# Patient Record
Sex: Female | Born: 1970 | Race: White | Hispanic: No | Marital: Married | Smoking: Never smoker
Health system: Southern US, Community
[De-identification: ages and names within clinical notes are randomized; demographics above are authoritative.]

## PROBLEM LIST (undated history)

## (undated) ENCOUNTER — Emergency Department (HOSPITAL_COMMUNITY): Admission: EM | Payer: 59 | Source: Home / Self Care

## (undated) DIAGNOSIS — E785 Hyperlipidemia, unspecified: Secondary | ICD-10-CM

## (undated) DIAGNOSIS — D649 Anemia, unspecified: Secondary | ICD-10-CM

## (undated) DIAGNOSIS — I471 Supraventricular tachycardia, unspecified: Secondary | ICD-10-CM

## (undated) DIAGNOSIS — R011 Cardiac murmur, unspecified: Secondary | ICD-10-CM

## (undated) DIAGNOSIS — E876 Hypokalemia: Secondary | ICD-10-CM

## (undated) DIAGNOSIS — F988 Other specified behavioral and emotional disorders with onset usually occurring in childhood and adolescence: Secondary | ICD-10-CM

## (undated) DIAGNOSIS — R6882 Decreased libido: Secondary | ICD-10-CM

## (undated) DIAGNOSIS — R7989 Other specified abnormal findings of blood chemistry: Secondary | ICD-10-CM

## (undated) DIAGNOSIS — N200 Calculus of kidney: Secondary | ICD-10-CM

## (undated) DIAGNOSIS — I499 Cardiac arrhythmia, unspecified: Secondary | ICD-10-CM

## (undated) DIAGNOSIS — F32A Depression, unspecified: Secondary | ICD-10-CM

## (undated) DIAGNOSIS — G43909 Migraine, unspecified, not intractable, without status migrainosus: Secondary | ICD-10-CM

## (undated) DIAGNOSIS — R87618 Other abnormal cytological findings on specimens from cervix uteri: Secondary | ICD-10-CM

## (undated) DIAGNOSIS — F419 Anxiety disorder, unspecified: Secondary | ICD-10-CM

## (undated) DIAGNOSIS — G479 Sleep disorder, unspecified: Secondary | ICD-10-CM

## (undated) DIAGNOSIS — F329 Major depressive disorder, single episode, unspecified: Secondary | ICD-10-CM

## (undated) DIAGNOSIS — R8789 Other abnormal findings in specimens from female genital organs: Secondary | ICD-10-CM

## (undated) HISTORY — DX: Anemia, unspecified: D64.9

## (undated) HISTORY — DX: Other abnormal cytological findings on specimens from cervix uteri: R87.618

## (undated) HISTORY — PX: ABLATION: SHX5711

## (undated) HISTORY — DX: Cardiac arrhythmia, unspecified: I49.9

## (undated) HISTORY — DX: Anxiety disorder, unspecified: F41.9

## (undated) HISTORY — DX: Hypokalemia: E87.6

## (undated) HISTORY — DX: Sleep disorder, unspecified: G47.9

## (undated) HISTORY — DX: Other specified behavioral and emotional disorders with onset usually occurring in childhood and adolescence: F98.8

## (undated) HISTORY — DX: Calculus of kidney: N20.0

## (undated) HISTORY — DX: Cardiac murmur, unspecified: R01.1

## (undated) HISTORY — DX: Other specified abnormal findings of blood chemistry: R79.89

## (undated) HISTORY — PX: CARDIOVERSION: SHX1299

## (undated) HISTORY — DX: Depression, unspecified: F32.A

## (undated) HISTORY — PX: COLPOSCOPY: SHX161

## (undated) HISTORY — DX: Major depressive disorder, single episode, unspecified: F32.9

## (undated) HISTORY — DX: Decreased libido: R68.82

## (undated) HISTORY — DX: Migraine, unspecified, not intractable, without status migrainosus: G43.909

## (undated) HISTORY — PX: BREAST LUMPECTOMY: SHX2

---
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
## 1997-11-30 ENCOUNTER — Inpatient Hospital Stay (HOSPITAL_COMMUNITY): Admission: AD | Admit: 1997-11-30 | Discharge: 1997-11-30 | Payer: Self-pay | Admitting: *Deleted

## 1997-12-09 ENCOUNTER — Inpatient Hospital Stay (HOSPITAL_COMMUNITY): Admission: AD | Admit: 1997-12-09 | Discharge: 1997-12-11 | Payer: Self-pay | Admitting: *Deleted

## 1998-01-11 ENCOUNTER — Other Ambulatory Visit: Admission: RE | Admit: 1998-01-11 | Discharge: 1998-01-11 | Payer: Self-pay | Admitting: Obstetrics and Gynecology

## 1998-02-01 ENCOUNTER — Emergency Department (HOSPITAL_COMMUNITY): Admission: EM | Admit: 1998-02-01 | Discharge: 1998-02-01 | Payer: Self-pay | Admitting: Emergency Medicine

## 1998-02-02 ENCOUNTER — Ambulatory Visit (HOSPITAL_COMMUNITY): Admission: RE | Admit: 1998-02-02 | Discharge: 1998-02-02 | Payer: Self-pay | Admitting: Urology

## 1998-02-25 ENCOUNTER — Ambulatory Visit (HOSPITAL_COMMUNITY): Admission: RE | Admit: 1998-02-25 | Discharge: 1998-02-25 | Payer: Self-pay | Admitting: Urology

## 1999-01-27 ENCOUNTER — Other Ambulatory Visit: Admission: RE | Admit: 1999-01-27 | Discharge: 1999-01-27 | Payer: Self-pay | Admitting: Obstetrics and Gynecology

## 1999-12-20 ENCOUNTER — Emergency Department (HOSPITAL_COMMUNITY): Admission: EM | Admit: 1999-12-20 | Discharge: 1999-12-20 | Payer: Self-pay | Admitting: Emergency Medicine

## 1999-12-20 ENCOUNTER — Encounter: Payer: Self-pay | Admitting: Emergency Medicine

## 2000-01-03 ENCOUNTER — Emergency Department (HOSPITAL_COMMUNITY): Admission: EM | Admit: 2000-01-03 | Discharge: 2000-01-03 | Payer: Self-pay | Admitting: Emergency Medicine

## 2000-01-05 ENCOUNTER — Encounter: Payer: Self-pay | Admitting: *Deleted

## 2000-01-05 ENCOUNTER — Encounter: Admission: RE | Admit: 2000-01-05 | Discharge: 2000-01-05 | Payer: Self-pay | Admitting: *Deleted

## 2000-01-20 ENCOUNTER — Ambulatory Visit (HOSPITAL_COMMUNITY): Admission: RE | Admit: 2000-01-20 | Discharge: 2000-01-20 | Payer: Self-pay | Admitting: Surgery

## 2000-01-20 ENCOUNTER — Encounter (INDEPENDENT_AMBULATORY_CARE_PROVIDER_SITE_OTHER): Payer: Self-pay | Admitting: Specialist

## 2000-02-02 ENCOUNTER — Other Ambulatory Visit: Admission: RE | Admit: 2000-02-02 | Discharge: 2000-02-02 | Payer: Self-pay | Admitting: Obstetrics and Gynecology

## 2000-03-01 ENCOUNTER — Other Ambulatory Visit: Admission: RE | Admit: 2000-03-01 | Discharge: 2000-03-01 | Payer: Self-pay | Admitting: Obstetrics and Gynecology

## 2000-03-01 ENCOUNTER — Encounter (INDEPENDENT_AMBULATORY_CARE_PROVIDER_SITE_OTHER): Payer: Self-pay

## 2000-08-27 ENCOUNTER — Other Ambulatory Visit: Admission: RE | Admit: 2000-08-27 | Discharge: 2000-08-27 | Payer: Self-pay | Admitting: Obstetrics and Gynecology

## 2000-08-28 HISTORY — PX: BREAST EXCISIONAL BIOPSY: SUR124

## 2000-12-21 ENCOUNTER — Other Ambulatory Visit: Admission: RE | Admit: 2000-12-21 | Discharge: 2000-12-21 | Payer: Self-pay | Admitting: Obstetrics and Gynecology

## 2001-02-06 ENCOUNTER — Emergency Department (HOSPITAL_COMMUNITY): Admission: EM | Admit: 2001-02-06 | Discharge: 2001-02-06 | Payer: Self-pay

## 2001-04-05 ENCOUNTER — Inpatient Hospital Stay (HOSPITAL_COMMUNITY): Admission: RE | Admit: 2001-04-05 | Discharge: 2001-04-07 | Payer: Self-pay | Admitting: Internal Medicine

## 2002-12-29 ENCOUNTER — Emergency Department (HOSPITAL_COMMUNITY): Admission: EM | Admit: 2002-12-29 | Discharge: 2002-12-29 | Payer: Self-pay | Admitting: Emergency Medicine

## 2004-02-11 ENCOUNTER — Other Ambulatory Visit: Admission: RE | Admit: 2004-02-11 | Discharge: 2004-02-11 | Payer: Self-pay | Admitting: Internal Medicine

## 2006-06-16 ENCOUNTER — Observation Stay (HOSPITAL_COMMUNITY): Admission: EM | Admit: 2006-06-16 | Discharge: 2006-06-17 | Payer: Self-pay | Admitting: Emergency Medicine

## 2007-12-14 ENCOUNTER — Inpatient Hospital Stay (HOSPITAL_COMMUNITY): Admission: EM | Admit: 2007-12-14 | Discharge: 2007-12-17 | Payer: Self-pay | Admitting: Emergency Medicine

## 2007-12-16 ENCOUNTER — Ambulatory Visit: Payer: Self-pay | Admitting: Psychiatry

## 2008-05-06 ENCOUNTER — Emergency Department (HOSPITAL_COMMUNITY): Admission: EM | Admit: 2008-05-06 | Discharge: 2008-05-07 | Payer: Self-pay | Admitting: Emergency Medicine

## 2009-05-20 ENCOUNTER — Emergency Department (HOSPITAL_COMMUNITY): Admission: EM | Admit: 2009-05-20 | Discharge: 2009-05-20 | Payer: Self-pay | Admitting: Emergency Medicine

## 2009-10-28 ENCOUNTER — Encounter: Admission: RE | Admit: 2009-10-28 | Discharge: 2009-10-28 | Payer: Self-pay | Admitting: Obstetrics and Gynecology

## 2010-09-18 ENCOUNTER — Encounter: Payer: Self-pay | Admitting: Gastroenterology

## 2010-12-02 LAB — URINALYSIS, ROUTINE W REFLEX MICROSCOPIC
Bilirubin Urine: NEGATIVE
Glucose, UA: NEGATIVE mg/dL
Hgb urine dipstick: NEGATIVE
Ketones, ur: NEGATIVE mg/dL
Nitrite: NEGATIVE
Protein, ur: NEGATIVE mg/dL
Specific Gravity, Urine: 1.026 (ref 1.005–1.030)
Urobilinogen, UA: 0.2 mg/dL (ref 0.0–1.0)
pH: 6 (ref 5.0–8.0)

## 2010-12-02 LAB — POCT I-STAT, CHEM 8
BUN: 10 mg/dL (ref 6–23)
Calcium, Ion: 1.11 mmol/L — ABNORMAL LOW (ref 1.12–1.32)
Chloride: 106 mEq/L (ref 96–112)
Creatinine, Ser: 1 mg/dL (ref 0.4–1.2)
Glucose, Bld: 100 mg/dL — ABNORMAL HIGH (ref 70–99)
HCT: 34 % — ABNORMAL LOW (ref 36.0–46.0)
Hemoglobin: 11.6 g/dL — ABNORMAL LOW (ref 12.0–15.0)
Potassium: 2.6 mEq/L — CL (ref 3.5–5.1)
Sodium: 140 mEq/L (ref 135–145)
TCO2: 21 mmol/L (ref 0–100)

## 2010-12-02 LAB — POCT PREGNANCY, URINE: Preg Test, Ur: NEGATIVE

## 2011-01-10 NOTE — H&P (Signed)
NAMEJANAYLA, MARIK                 ACCOUNT NO.:  [id number]   MEDICAL RECORD NO.:  [id number]          PATIENT TYPE:  EMS   LOCATION:  ED                           FACILITY:  [facility]   PHYSICIAN:  Lonia Blood, M.D.DATE OF BIRTH:  12/02/1970   DATE OF ADMISSION:  12/14/2007  DATE OF DISCHARGE:                              HISTORY & PHYSICAL   PRIMARY CARE PHYSICIAN:  Dr. Marisue Brooklyn   CHIEF COMPLAINT:  Dizziness.   HISTORY OF PRESENT ILLNESS:  Ms. Daziya Redmond is a 40 year old female who  states that she has had an approximate 3-5 day history of worsening  dizziness.  This has also been associated with generalized sensation of  weakness and a sensation of tingling in her lower extremities  bilaterally.  The patient does admit to having possibly 3-4 days of  diarrhea as well.  She states this has been accompanied by some nausea  but no real vomiting.  She admits to using Dulcolax 2-3 tablets on an  every day or every other day basis but states that she has been doing  this for years, and that her recent diarrhea was a new development.  She  denies any other sick contacts or intake of new foods of distant travel.  It is interesting that she had a similar admission in October 2007, with  severe otherwise unexplained dehydration.  At the present time, she  denies chest pain, fevers, chills, shortness of breath.  She does have a  mild generalized headache and also significant dizziness with any  postural change whatsoever.  Despite this, she is actually thirsty and  does wish to eat.   REVIEW OF SYSTEMS:  Comprehensive review of systems is positive for a  significant flat affect.  The patient admits to feeling depressed due to  losing her job approximately 1 month ago.  Otherwise, comprehensive  review of systems unremarkable with the exception of multiple positive  elements noted in history of present illness above.   PAST MEDICAL HISTORY:  1. Depression.  2. Anxiety  disorder.  3. Migraine headaches.  4. Arrhythmia status post radiofrequency ablation by Dr. Lewayne Bunting      in 2002.  5. History of laxative/abuse.   OUTPATIENT MEDICATIONS:  1. Prozac 60 mg daily.  2. Imitrex p.r.n.  3. Xanax 0.25 p.o. q.h.s.   ALLERGIES:  ZITHROMAX.   FAMILY HISTORY:  No significant family history of diabetes,  hypertension, or coronary artery disease.   SOCIAL HISTORY:  The patient does not smoke.  She does not use illicit  drugs.  She lives in [place].  She has 2 children.  She socially  drinks alcohol, but this is less than 1-2 drinks per week on average.   DATA REVIEW:  White count is elevated at 13.1.  Hemoglobin and platelet  count are normal.  MCV is normal.  Sodium, potassium chloride bicarb are  normal.  BUN is markedly elevated at 59.  Creatinine is markedly  elevated at 6.23.  Serum glucose and calcium are normal.  LFTs are  normal.  Albumin is 3.9.  PHYSICAL EXAM:  Temperature has not been recorded in the ER.  Blood  pressure is 83/56, heart rate is 87-101, respiratory rate 22, O2  saturations 95% on room air.  GENERAL:  Thin, frail-appearing female in no acute respiratory distress.  HEENT:  Normocephalic, atraumatic.  Pupils equal, round, react to light  and accommodation.  Extraocular muscles intact bilaterally, OP clear.  NECK:  No JVD.  Lungs:  Clear to auscultation bilaterally without wheeze or rhonchi.  CARDIOVASCULAR:  Regular rate and rhythm but tachycardic without gallop  or rub.  ABDOMEN:  Nontender, nondistended, soft bowel sounds present, no  hepatosplenomegaly, no rebound, no ascites.  EXTREMITIES:  As noted, no cyanosis, clubbing, edema bilateral  extremities.  NEUROLOGIC:  5/5 strength bilateral upper and lower extremities, intact  sensation to touch throughout.  No Babinski, alert and oriented x4.   IMPRESSION AND PLAN:  1. Severe acute renal failure - this appears to be prerenal in this 38-      year-old otherwise  healthy female who is clearly severely volume      depleted.  We will aggressively hydrate her.  I will obtain a renal      ultrasound to assure that there is no evidence of a lower urinary      tract obstruction or a bilateral hydronephrosis.  This, however, is      not likely, but I will proceed with the exam for completeness' sake      due to the severity of her renal failure.  We will watch her      electrolytes closely.  2. Severe dehydration and hypotension - this appears to be a      hypovolemic hypotension.  We will aggressively hydrate this 37-year-      old with no history of coronary disease or congestive heart      failure.  We will follow her blood pressure and heart rate closely.  3. Depression - we will continue the patient's Prozac.  4. 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  We will try Senokot during      her hospital stay and see if she is able to move her bowels without      constant Dulcolax therapy.      Lonia Blood, M.D.  Electronically Signed     JTM/MEDQ  D:  12/14/2007  T:  12/14/2007  Job:  [id number]   cc:   Lovenia Kim, D.O.  Fax: [phone]

## 2011-01-13 NOTE — Op Note (Signed)
[facility]  Patient:    [name], [name]                        MRN: [id number] Proc. Date: 01/20/00 Adm. Date:  [id number] Disc. Date: [id number] Attending:  Abigail Miyamoto A                           Operative Report  PREOPERATIVE DIAGNOSIS:  Right breast mass.  POSTOPERATIVE DIAGNOSIS:  Right breast mass.  OPERATION PERFORMED:  Excision of right breast mass.  SURGEON:  Abigail Miyamoto, M.D.  ANESTHESIA:  1% lidocaine and MAC.  ESTIMATED BLOOD LOSS:  Minimal.  DESCRIPTION OF PROCEDURE:  The patient was brought to the operating room and identified as Emily Park.  She was placed supine on the operating table and anesthesia was induced.  Her right breast was then prepped and draped in the usual sterile fashion.  The skin overlying the palpable breast mass at the 12 oclock position was then anesthetized with 1% lidocaine.  Incision was carried down to the breast tissue with a 15 blade scalpel.  Next, a generous lumpectomy was performed removing all the surrounding tissue around the palpable breast mass going down to the chest wall.  The entire specimen which was a golf ball size specimen was then completely excised and sent to pathology for identification.  The wound cavity was then irrigated with normal saline.  Hemostasis was achieved with electrocautery.  The subcutaneous layer was then closed with interrupted 3-0 Vicryl sutures and the skin was closed with running 4-0 Monocryl.  Steri-Strips, gauze and Tegaderm were then applied.  The patient tolerated the procedure well.  Sponge, needle and instrument counts were correct at the end of the procedure.  The patient was then extubated in the operating room and taken in stable condition to the recovery room. DD:  01/20/00 TD:  01/24/00 Job: [id number]

## 2011-01-13 NOTE — H&P (Signed)
[name], Park                 ACCOUNT NO.:  [id number]   MEDICAL RECORD NO.:  [id number]          PATIENT TYPE:  EMS   LOCATION:  ED                           FACILITY:  [facility]   PHYSICIAN:  Lonia Blood, M.D.      DATE OF BIRTH:  Apr 10, 1971   DATE OF ADMISSION:  06/16/2006  DATE OF DISCHARGE:                                HISTORY & PHYSICAL   PRIMARY CARE PHYSICIAN:  Dr. Marisue Brooklyn.   PRESENTING COMPLAINT:  Weakness and dizziness.   HISTORY OF PRESENT ILLNESS:  The patient is a 40 year old with a history of  depression, anxiety, who had recently felt weak and dry.  She has been  taking laxatives apparently on a regular basis.  Denied any unusual  diarrhea.  She is nauseated but no vomiting.  The patient was seen in the  emergency room with hypotension; systolic blood pressure 89.  She is also  profoundly hypokalemic, hence she has been admitted for further evaluation.   PAST MEDICAL HISTORY:  1. Depression.  2. Anxiety.  3. A history of arrhythmia status post ablation.  4. Frequent migraine headaches.   ALLERGIES:  THE PATIENT IS ALLERGIC TO ZITHROMAX.   MEDICATIONS:  1. Prozac 60 mg daily.  2. Imitrex as needed.  3. Xanax 0.25 mg at night.  4. Topamax 50-100 mg daily.   SOCIAL HISTORY:  The patient is married with two children ages 39 and 26 and  lives here in [place].  No tobacco use.  Social alcohol drinker.  She is  gainfully employed.   FAMILY HISTORY:  No family history of diabetes, hypertension, or any  psychiatric illness.   REVIEW OF SYSTEMS:  A 12-point review of systems is negative except for some  weight loss associated with her nausea, vomiting.   PHYSICAL EXAMINATION:  VITAL SIGNS:  Temperature 97.6, blood pressure  initially 89/66, pulse 104, respiratory rate 18, saturations 100% on room  air.  GENERAL:  Generally, the patient looks weak and acutely ill-looking but in  no acute distress.  HEENT:  PERRL.  EOMI.  Her mucous membranes are  dry.  NECK:  Her neck is supple.  No JVD.  No lymphadenopathy.  RESPIRATORY:  She has good air entry bilaterally.  No wheezes or rales.  CARDIOVASCULAR SYSTEM:  She has S1, S2.  No murmurs.  ABDOMEN:  Soft and nontender with positive bowel sounds.  EXTREMITIES:  Extremities show no edema, cyanosis, or clubbing.   LABS:  White count of 9.9, hemoglobin 14.3, platelet count 370 with mild  monocytosis.  Sodium is 137, potassium 2.4, chloride 99, CO2 25, glucose 94,  BUN 17, creatinine 2.3, calcium 9.7.  Her urinalysis showed cloudy urine  with positive leukocyte esterase, WBCs 3-6, and many bacteria, and some  hyaline casts, and many epithelials.   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  Also, most  likely urinary tract infection.   PLAN:  1. Profound hypokalemia.  This is most likely from fluid loss.  I suspect  diarrhea from the laxative abuse.  We will replete patient's potassium.      We will also check magnesium level to be sure that her magnesium is      okay.  We will get her put on overnight observation until her potassium      is fully corrected.  2. Profound hypotension; this is also secondary to fluid loss.  She will      receive at least 2 liters' bolus then we will continue on at least 150      mL/hour and monitor her blood pressure as well as her renal function.  3. Possible urinary tract infection.  We will start her on empiric Cipro      and go with a urine culture and sensitivity.  This may be responsible      for her nausea as well.  4. Acute renal failure.  Again, this is most likely prerenal.  We will      hydrate the patient effectively and follow her renal function in the      hospital.  5. Depression, anxiety, with insomnia.  I will put the patient on her home      medications since she is able to take POs at this point.  6. Migraine headaches.  I will  continue with her Topamax prophylaxis for      now.  Otherwise, the patient will be here for observation and depending      on how she does overnight, we will plan further treatment measures.      Lonia Blood, M.D.  Electronically Signed     LG/MEDQ  D:  06/16/2006  T:  06/17/2006  Job:  [id number]

## 2011-01-13 NOTE — Discharge Summary (Signed)
[place]. [facility]  Patient:    Emily Park, Emily Park                        MRN: [id number] Adm. Date:  [id number] Disc. Date: 04/05/01 Attending:  Lewayne Bunting Dictator:   Chinita Pester, C.R.N.P. CC:         Darden Palmer., M.D.   Discharge Summary  PRIMARY DIAGNOSES:  Supraventricular tachycardia.  HISTORY OF PRESENT ILLNESS:  This is a 40 year old female who is being admitted for an SVT ablation.  She is a patient of Dr. Tawana Scale.  Her history dates back to approximately two years ago.  She states at that time she began experiencing intermittent episodes of palpitations that gradually worsened in severity and became longer in duration.  Subsequently found to have supraventricular tachycardia.  She was seen in the emergency room on several occasions and treated with Adenosine with conversion to sinus rhythm.  She has been treated with multiple medications including Lopressor, Inderal, verapamil, and Cardizem.  Unfortunately, she continues to have supraventricular tachycardia.  Denies any chest pain, but does experience shortness of breath and near syncope.  When she develops her episodes of SVT these will often terminate spontaneously, but sometimes require intravenous medication.  Subsequent evaluation demonstrated normal left ventricular systolic function, no structural or valvular abnormalities, and mild mitral and tricuspid regurgitation.  HOSPITAL COURSE:  Patient was admitted and underwent an EP study and radio frequency ablation of slow AV nodal pathway performed without immediate complications.  On the post procedural evening patient had recurrent SVT at 150-160 beats per minute initiated by two PVCs while sleeping.  Patient was to be evaluated in another six weeks.  She was discharged to home on p.r.n. verapamil 90 mg q.8h.  She was to follow with Dr. Sharrell Ku in six weeks. Patient was also instructed to take a coated aspirin q.d. for six  weeks and antibiotics p.r.n. prior to any dental work, GYN procedures, or bowel procedures within the next three months.  She was instructed not to do any heavy lifting or strenuous activity for four days and no driving for two days.  Low fat, low cholesterol diet.  She was also instructed to call if she developed a lump in her groin or any drainage from her wounds.  She was to call Dr. Bruna Potter office to schedule the appointment for six weeks. DD:  04/05/01 TD:  04/06/01 Job: [id number]

## 2011-01-13 NOTE — Discharge Summary (Signed)
NAMEBRIAUNA, Emily Park                 ACCOUNT NO.:  [id number]   MEDICAL RECORD NO.:  [id number]          PATIENT TYPE:  INP   LOCATION:  1612                         FACILITY:  [facility]   PHYSICIAN:  Hettie Holstein, D.O.    DATE OF BIRTH:  1971/05/17   DATE OF ADMISSION:  06/16/2006  DATE OF DISCHARGE:  06/17/2006                                 DISCHARGE SUMMARY   Patient's primary care physician is Dr. Marisue Brooklyn, whom I am asking Ms.  Jarold Park to call and schedule an appointment with for hospital followup this  week.  I am asking that she call tomorrow, as today is Sunday.   REASON FOR ADMISSION:  Weakness, dizziness.   PRINCIPAL DIAGNOSES:  1. Dehydration, hypokalemia.  2. Hypotension, felt to be due to dehydration, volume depletion.  3. Anemia, normocytic.  She has recently donated blood in the past 48      hours.  Iron studies have been sent out.  She will be sent home also on      iron supplementation.  4. History of depression and anxiety.  5. History of arrhythmias, status post ablation in the past.  6. History of migraine headaches.   DISCHARGE MEDICATIONS:  1. I am asking her to continue her Prozac as before, 60 mg daily.  2. Imitrex is on a p.r.n. basis.  3. Xanax 0.25 mg nightly p.r.n.  4. Topamax 50 mg tablets 1-2 q.24h. for migraine prophylaxis.  5. Additional iron sulfate 325 mg p.o. b.i.d. x2 weeks.  6. Ambien 5 mg p.o. nightly p.r.n. insomnia.   DISPOSITION AT PRESENT:  I am asking that Emily Park follow up with her  primary care physician this week to coordinate perhaps some psychiatric  followup, as she has appeared quite depressed here.  She has denied any  suicidal or homicidal ideation, although her appetite has been poor.  She  reports an exhibited weight loss.   In addition, she has neglected to continue her follow-up screening exams,  including Pap smears and routine pelvic examinations as well as mammograms.  I am asking that she reinitiate these  screening processes.  We are  administering a flu vaccine prior to discharge.   HISTORY OF PRESENT ILLNESS:  For full details, please refer to H&P as  dictated by Dr. Lonia Blood; however, briefly, Emily Park is a 40 year old  female with a history of depression and anxiety who has been feeling quite  weak and was noted to be hypotensive.  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   HOSPITAL COURSE:  Emily Park was admitted and provided with generous IV  fluids.  Her orthostatic symptoms resolved.  Her blood pressure did  normalize.  She did appear to have resolve of her symptoms.  She was  repleted with oral potassium supplements as well as supplemental potassium  in her IV fluids.  She was encouraged to take in fluids, including sports  drinks such as Gatorade or Power Aid for the next 24-48 hours until she sees  her  primary care physician.  In addition, she is being provided with outpatient  information for supportive options, including counseling and psychiatry.  She does have [facility], and she is encouraged to resource the list  of providers in that directory so that she can obtain referral.      Hettie Holstein, D.O.  Electronically Signed     ESS/MEDQ  D:  06/17/2006  T:  06/17/2006  Job:  [id number]   cc:   Lovenia Kim, D.O.  Fax: [phone]

## 2011-01-13 NOTE — Discharge Summary (Signed)
[place]. [facility]  Patient:    Emily Park, Emily Park                        MRN: [id number] Adm. Date:  [id number] Disc. Date: 04/07/01 Attending:  Lewayne Bunting Dictator:   Tereso Newcomer, P.A.-C. CC:         Darden Palmer., M.D.   Discharge Summary  ADDENDUM:  The patient developed recurrent SVT, suggestive of left atrial tachycardia. Dr. Ladona Ridgel saw the patient on April 05, 2001, and decided to start her on flecainide therapy.  She will need to be monitored in the hospital over the weekend.  On August 10, she was in stable condition without any further palpitations or nausea.  Telemetry revealed normal sinus rhythm, no arrhythmias.  Her ECG revealed a Q-Tc of 431 msec.  She was continued on flecainide and continued to be monitored.  On April 07, 2001, she was found to be in stable condition without any further arrhythmias.  Therefore, it was felt she was stable enough to discharge to home.  DISCHARGE MEDICATIONS: 1. Flecainide 50 mg q.12h. 2. Toprol XL 25 mg q.d. 3. Coated aspirin 325 mg for 6 weeks.  ACTIVITY:  Nothing strenuous for 2-3 days.  DIET:  Low fat, low sodium.  SPECIAL INSTRUCTIONS:  The patient is to call our office for any swelling, bleeding, drainage, or bruising.  Special note:  She will need antibiotics prior to any dental, GYN or bowel procedures in the next three months.  FOLLOWUP:  She will need followup with exercise treadmill test to rule out exercise-induced arrhythmias while on flecainide Friday, August 16 and she will need a followup appointment with Dr. Lewayne Bunting in 4-6 weeks.  The office should call her with an appointment.  FINAL DIAGNOSES: 1. 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 2. Status post radiofrequency ablation for slow arteriovenous nodal    pathway. 3. Recurrent supraventricular tachycardia, suggestive of left atrial    tachycardia. 4. Flecainide therapy. DD:  04/07/01 TD:   04/07/01 Job: [id number]

## 2011-01-13 NOTE — Procedures (Signed)
[place]. [facility]  Patient:    Emily Park, Emily Park                        MRN: [id number] Proc. Date: 04/04/01 Adm. Date:  [id number] Attending:  Lewayne Bunting CC:         Darden Palmer., M.D.             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  INTRODUCTION:  The patient is a very pleasant 40 year old woman with a two-year history of tachypalpitations, which start and stop suddenly and are terminated with AV nodal blockingdrugs.  The patient states that during tachycardia she feels pulsations in her neck.  She is now referred for electrophysiologic study and RF catheter ablation, as she has had recurrent tachypalpitations despite medical therapy.  DESCRIPTION OF PROCEDURE:  After informed consent was obtained, the patient was taken to the diagnostic EP lab in a fasted state.  After the usual preparation and draping, intravenous fentanyl and midazolam were given for sedation.  A 6 French hexapolar catheter was inserted percutaneously in the right jugular vein and advanced to the coronary sinus.  A 5 French quadripolar catheter was inserted percutaneously in the right femoral vein and advanced to the RV apex.  A 5 French quadripolar catheter was inserted percutaneously in the right femoral vein and advanced to the His bundle region.  After measurement of the basic intervals, rapid ventricular pacing was carried out from the RV apex, demonstrating a VAWenckebach cycle length of 550 milliseconds.  During rapid ventricular pacing, the atrial activation was midline and decremental.  Next, programmed ventricular stimulation was carried out to the RV apex at a basic drive cycle length of 962 milliseconds and stepwise decreased down to 540 milliseconds, where the retrograde AV node ERP was observed.  Next, rapid atrial pacing was carried out  from the coronary sinus at 490 milliseconds and stepwise decreased down to 290 milliseconds, where AV Wenckebach was observed.  During rapid atrial pacing, the PR interval became greater thanthe RR interval, but there was no inducible sustained SVT. Programmed atrial stimulation was then carried out from the coronary sinus at a basic drive cycle length of 952 milliseconds and stepwise decreased down t 210 milliseconds, where the atrial ERP was observed.  At this point isoproterenol was infused at rates varying from 1-3 mcg/min.  Additional rapid atrial pacing was carried out, and the AV Wenckebach cycle length on isoproterenol was 240 milliseconds.  Again the PR interval became greater than the RR interval, but there was no sustained inducible SVT.  There were double echo beats present.  Interestingly enough, the earliest activation appeared to be in the left atrium approximately 3 or 4 cm from the coronary sinus ostium. 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  To determine whether this was, in fact, the case, the right femoral artery was punctured and a quadripolar catheter was advanced retrograde across the aortic valve and placed on the ventricular insertion of the mitral annulus at the siteof the proposed accessory pathway. Ventricular pacing was carried out from this location, and there was no evidence of accessory pathway retrograde conduction.  At this point, IV Lopressor was given in an attempt to initiate tachycardia, and this also was unsuccessful with no SVT induced during rapid atrial pacing.  Finally the ablation catheter was maneuvered into the slow pathway region.  Based on the patients previous history of arrhythmias and based on her findings at electrophysiologic testing and despite the induction of sustained SVT, six RF energy applications were subsequently delivered to the slow pathway  region, which resulted in accelerated junctional rhythm.  Following this, additional rapid atrial pacing was carried out demonstrating no evidence of SVT, and her AV Wenckebach cycle length went from 290 milliseconds up to 350 milliseconds. At this point the catheters were removed, hemostasis assured, and the patient returned to her room in satisfactory condition.  COMPLICATIONS:  There were no immediate procedural complications.  RESULTS: A. BASELINE ELECTROCARDIOGRAM:  The baseline ECG demonstrates normal sinus    rhythm with normal axis and intervals and no evidence of ventricular    pre-excitation. B. BASELINE INTERVALS:  The sinus node cycle length was 833 milliseconds, the    QRS duration 90 milliseconds, the PR interval 147 milliseconds,and the    HV interval 53 milliseconds.  C. RAPID ATRIAL PACING:Rapid atrial pacing was carried out from the coronary    sinus as well asthe high right atrium, demonstrating an AV Wenckebach    cycle length of 290 milliseconds.  During isoproterenol infusion, the AV    Wenckebach cycle length was 240 milliseconds, and following catheter    ablation the AVWenckebach cycle length was 340 milliseconds.  D. PROGRAMMED ATRIAL STIMULATION:  Programmed atrial stimulation was carried    out from the coronary sinus as well as the high right atrium at basic    drive cycle length of 161 milliseconds and stepwise decreased down to 210    milliseconds, where atrial ERP was observed.  During programmed atrial    stimulation, there were multiple AH jumps.  E. RAPID VENTRICULAR PACING:  Rapid ventricular pacing demonstrated a VA    Wenckebach cycle length of 550 milliseconds.  During rapid ventricular    pacing the atrial activation sequence was midline and decremental.  F. PROGRAMMED VENTRICULAR STIMULATION:  Programmed ventricular stimulation    carried out from the right ventricle demonstrated a retrograde AV node ERP    of 600/540.  During rapid  ventricular pacing, the atrial activation    sequence remained midline and decremental.   G. ARRHYTHMIAS OBSERVED:  There were no sustained arrhythmias observed.  H. MAPPING:  Mapping was carried out, demonstrating the typical Kochs    triangle.  I. RADIOFREQUENCY ENERGY APPLICATION:  A total of six RF energy applications    were delivered to the slow pathway region, which resulted in accelerated    junctional rhythm.  Following this, the AV Wenckebach cycle length    increased from 290 to 340 milliseconds.  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  Because of the patients clinical history of arrhythmia and documented SVT as well as findings at electrophysiologic testing, RF energy application was subsequently carried out to the slow pathway region.  There were no immediate procedural complications. DD:  04/04/01 TD:  04/05/01 Job: [id number]

## 2011-01-13 NOTE — Discharge Summary (Signed)
Emily Park, Emily Park                 ACCOUNT NO.:  [id number]   MEDICAL RECORD NO.:  [id number]          PATIENT TYPE:  INP   LOCATION:  1234                         FACILITY:  [facility]   PHYSICIAN:  Hettie Holstein, D.O.    DATE OF BIRTH:  05/05/1971   DATE OF ADMISSION:  12/14/2007  DATE OF DISCHARGE:  12/17/2007                               DISCHARGE SUMMARY   However, [name] left against medical advice this is just a  summation of that course prior to her signing out against advice.   DATE OF ADMISSION:  December 14, 2007.   PRIMARY CARE Abri Vacca:  Dr. Marisue Brooklyn.   DIAGNOSES:  Suspected adrenal insufficiency with a.m. cortisol measure  of 2.8 with a presentation of a hypotension and there were further  recommendations for evaluation including MRI of the brain.  This was  performed and essentially was no significant findings.  There were some  recommendations for psychiatric evaluation as the patient had reported  some bulimia and anorexia but the patient again declined these.   HISTORY OF PRESENT ILLNESS:  Please refer to the HPI as dictated by Dr.  Sharon Seller. However, briefly [name] is a 40 year old female who stated  that she had been having 3 to 5 day history of worsening dizziness. This  was associated with generalized sensation of weakness and tingling in  her lower extremities bilaterally. She is having some diarrhea as well.  She admitted to chronic and recurrent uses of daily laxatives.  She had  also similar admission in October 2007. In review of the record where  she had had unexplained dehydration quite severe.  In any event hospital  course was unremarkable.  In any event, she was able that touch base  with Dr. Electa Sniff prior to departing and she had agreed to pursue further  outpatient evaluation and workup for adrenal insufficiency on an  outpatient basis. Medications were not prescribed.      Hettie Holstein, D.O.  Electronically Signed     ESS/MEDQ   D:  02/12/2008  T:  02/12/2008  Job:  [id number]

## 2011-05-23 ENCOUNTER — Other Ambulatory Visit: Payer: Self-pay | Admitting: Specialist

## 2011-05-23 DIAGNOSIS — E049 Nontoxic goiter, unspecified: Secondary | ICD-10-CM

## 2011-05-23 LAB — BASIC METABOLIC PANEL
BUN: 25 — ABNORMAL HIGH
BUN: 40 — ABNORMAL HIGH
BUN: 5 — ABNORMAL LOW
BUN: 5 — ABNORMAL LOW
CO2: 19
CO2: 21
CO2: 23
CO2: 24
Calcium: 7.9 — ABNORMAL LOW
Calcium: 8.1 — ABNORMAL LOW
Calcium: 8.2 — ABNORMAL LOW
Calcium: 8.2 — ABNORMAL LOW
Chloride: 111
Chloride: 112
Chloride: 112
Chloride: 117 — ABNORMAL HIGH
Creatinine, Ser: 0.73
Creatinine, Ser: 0.8
Creatinine, Ser: 1.74 — ABNORMAL HIGH
Creatinine, Ser: 2.99 — ABNORMAL HIGH
GFR calc Af Amer: 21 — ABNORMAL LOW
GFR calc Af Amer: 40 — ABNORMAL LOW
GFR calc Af Amer: >60
GFR calc Af Amer: >60
GFR calc non Af Amer: 18 — ABNORMAL LOW
GFR calc non Af Amer: 33 — ABNORMAL LOW
GFR calc non Af Amer: >60
GFR calc non Af Amer: >60
Glucose, Bld: 141 — ABNORMAL HIGH
Glucose, Bld: 149 — ABNORMAL HIGH
Glucose, Bld: 87
Glucose, Bld: 88
Potassium: 3.2 — ABNORMAL LOW
Potassium: 3.4 — ABNORMAL LOW
Potassium: 3.8
Potassium: 4.6
Sodium: 137
Sodium: 138
Sodium: 139
Sodium: 141

## 2011-05-23 LAB — RENAL FUNCTION PANEL
Albumin: 3.1 — ABNORMAL LOW
BUN: 16
CO2: 19
Calcium: 8.3 — ABNORMAL LOW
Chloride: 113 — ABNORMAL HIGH
Creatinine, Ser: 1.07
GFR calc Af Amer: >60
GFR calc non Af Amer: 57 — ABNORMAL LOW
Glucose, Bld: 132 — ABNORMAL HIGH
Phosphorus: 2.1 — ABNORMAL LOW
Potassium: 3.5
Sodium: 137

## 2011-05-23 LAB — FOLATE
Folate: 4.2
Folate: 6.7

## 2011-05-23 LAB — CBC
HCT: 30.7 — ABNORMAL LOW
HCT: 32.8 — ABNORMAL LOW
HCT: 40.9
Hemoglobin: 10 — ABNORMAL LOW
Hemoglobin: 11.3 — ABNORMAL LOW
Hemoglobin: 14.2
MCHC: 32.4
MCHC: 34.4
MCHC: 34.7
MCV: 91.9
MCV: 93
MCV: 93.1
Platelets: 215
Platelets: 234
Platelets: 292
RBC: 3.3 — ABNORMAL LOW
RBC: 3.53 — ABNORMAL LOW
RBC: 4.45
RDW: 12.7
RDW: 12.9
RDW: 13.1
WBC: 13.1 — ABNORMAL HIGH
WBC: 7.4
WBC: 8.1

## 2011-05-23 LAB — DIFFERENTIAL
Basophils Absolute: 0.1
Basophils Relative: 1
Eosinophils Absolute: 0.2
Eosinophils Relative: 2
Lymphocytes Relative: 22
Lymphs Abs: 2.9
Monocytes Absolute: 0.9
Monocytes Relative: 7
Neutro Abs: 8.9 — ABNORMAL HIGH
Neutrophils Relative %: 68

## 2011-05-23 LAB — RETICULOCYTES
RBC.: 3.28 — ABNORMAL LOW
Retic Count, Absolute: 32.8
Retic Ct Pct: 1

## 2011-05-23 LAB — URINALYSIS, ROUTINE W REFLEX MICROSCOPIC
Bilirubin Urine: NEGATIVE
Glucose, UA: NEGATIVE
Hgb urine dipstick: NEGATIVE
Ketones, ur: NEGATIVE
Nitrite: NEGATIVE
Protein, ur: NEGATIVE
Specific Gravity, Urine: 1.007
Urobilinogen, UA: 0.2
pH: 5.5

## 2011-05-23 LAB — METHYLMALONIC ACID(MMA), RND URINE
Creatinine, Urine mg/dL-MMAURN: 21 mg/dL
Methylmalonic Acid, Random Urine: 0.9 mmol/mol CRT (ref 0.0–3.6)
Methylmalonic Acid, Ur: 1.6 umol/L

## 2011-05-23 LAB — COMPREHENSIVE METABOLIC PANEL
ALT: 14
AST: 27
Albumin: 3.9
Alkaline Phosphatase: 47
BUN: 59 — ABNORMAL HIGH
CO2: 20
Calcium: 8.6
Chloride: 101
Creatinine, Ser: 6.33 — ABNORMAL HIGH
GFR calc Af Amer: 9 — ABNORMAL LOW
GFR calc non Af Amer: 7 — ABNORMAL LOW
Glucose, Bld: 70
Potassium: 3.6
Sodium: 133 — ABNORMAL LOW
Total Bilirubin: 1.1
Total Protein: 7.1

## 2011-05-23 LAB — TSH: TSH: 0.174 — ABNORMAL LOW

## 2011-05-23 LAB — T3, FREE: T3, Free: 2.1 — ABNORMAL LOW (ref 2.3–4.2)

## 2011-05-23 LAB — FERRITIN: Ferritin: 62 (ref 10–291)

## 2011-05-23 LAB — MAGNESIUM: Magnesium: 2.1

## 2011-05-23 LAB — CORTISOL
Cortisol, Plasma: 0.8
Cortisol, Plasma: 15
Cortisol, Plasma: 19.9

## 2011-05-23 LAB — IRON AND TIBC
Iron: 133
Saturation Ratios: 55
TIBC: 244 — ABNORMAL LOW
UIBC: 111

## 2011-05-23 LAB — PROTIME-INR
INR: 0.9
Prothrombin Time: 12.8

## 2011-05-23 LAB — PHOSPHORUS: Phosphorus: 4.9 — ABNORMAL HIGH

## 2011-05-23 LAB — URINE CULTURE
Colony Count: NO GROWTH
Culture: NO GROWTH

## 2011-05-23 LAB — URINALYSIS, DIPSTICK ONLY
Bilirubin Urine: NEGATIVE
Glucose, UA: NEGATIVE
Hgb urine dipstick: NEGATIVE
Ketones, ur: NEGATIVE
Leukocytes, UA: NEGATIVE
Nitrite: NEGATIVE
Protein, ur: NEGATIVE
Specific Gravity, Urine: 1.008
Urobilinogen, UA: 0.2
pH: 6

## 2011-05-23 LAB — PREGNANCY, URINE: Preg Test, Ur: NEGATIVE

## 2011-05-23 LAB — APTT: aPTT: 26

## 2011-05-23 LAB — VITAMIN B12
Vitamin B-12: 277 (ref 211–911)
Vitamin B-12: >2000 — ABNORMAL HIGH (ref 211–911)

## 2011-05-23 LAB — ARGININE VASOPRESSIN HORMONE: Arginine Vasopressin: 1.3 pg/mL (ref 0.0–4.7)

## 2011-05-23 LAB — CORTISOL-AM, BLOOD: Cortisol - AM: 2.8 — ABNORMAL LOW

## 2011-05-23 LAB — OSMOLALITY, URINE: Osmolality, Ur: 334 — ABNORMAL LOW

## 2011-05-23 LAB — T4, FREE: Free T4: 1.14

## 2011-05-23 LAB — OSMOLALITY: Osmolality: 284

## 2011-05-26 ENCOUNTER — Other Ambulatory Visit: Payer: Self-pay

## 2011-05-31 LAB — URINALYSIS, ROUTINE W REFLEX MICROSCOPIC
Bilirubin Urine: NEGATIVE
Glucose, UA: NEGATIVE
Leukocytes, UA: NEGATIVE
Nitrite: NEGATIVE
Protein, ur: 30 — AB
Specific Gravity, Urine: 1.033 — ABNORMAL HIGH
Urobilinogen, UA: 0.2
pH: 5.5

## 2011-05-31 LAB — BASIC METABOLIC PANEL
BUN: 9
CO2: 24
Calcium: 8.9
Chloride: 101
Creatinine, Ser: 1.23 — ABNORMAL HIGH
GFR calc Af Amer: 59 — ABNORMAL LOW
GFR calc non Af Amer: 49 — ABNORMAL LOW
Glucose, Bld: 115 — ABNORMAL HIGH
Potassium: 2.5 — CL
Sodium: 133 — ABNORMAL LOW

## 2011-05-31 LAB — CBC
HCT: 33.4 — ABNORMAL LOW
Hemoglobin: 11.3 — ABNORMAL LOW
MCHC: 33.8
MCV: 94.5
Platelets: 301
RBC: 3.53 — ABNORMAL LOW
RDW: 13.3
WBC: 9.1

## 2011-05-31 LAB — DIFFERENTIAL
Basophils Absolute: 0
Basophils Relative: 0
Eosinophils Absolute: 0
Eosinophils Relative: 0
Lymphocytes Relative: 20
Lymphs Abs: 1.8
Monocytes Absolute: 0.8
Monocytes Relative: 9
Neutro Abs: 6.5
Neutrophils Relative %: 71

## 2011-05-31 LAB — SALICYLATE LEVEL: Salicylate Lvl: <4

## 2011-05-31 LAB — ETHANOL: Alcohol, Ethyl (B): <5

## 2011-05-31 LAB — RAPID URINE DRUG SCREEN, HOSP PERFORMED
Amphetamines: NOT DETECTED
Barbiturates: NOT DETECTED
Benzodiazepines: NOT DETECTED
Cocaine: POSITIVE — AB
Opiates: POSITIVE — AB
Tetrahydrocannabinol: NOT DETECTED

## 2011-05-31 LAB — PREGNANCY, URINE: Preg Test, Ur: NEGATIVE

## 2011-05-31 LAB — ACETAMINOPHEN LEVEL: Acetaminophen (Tylenol), Serum: 12.7

## 2011-05-31 LAB — URINE MICROSCOPIC-ADD ON

## 2012-11-26 LAB — HM PAP SMEAR

## 2013-09-10 ENCOUNTER — Other Ambulatory Visit: Payer: Self-pay

## 2013-09-10 DIAGNOSIS — Z1231 Encounter for screening mammogram for malignant neoplasm of breast: Secondary | ICD-10-CM

## 2013-10-02 ENCOUNTER — Ambulatory Visit
Admission: RE | Admit: 2013-10-02 | Discharge: 2013-10-02 | Disposition: A | Discharge status: Home / Self Care | Payer: Self-pay | Source: Ambulatory Visit

## 2013-10-02 DIAGNOSIS — Z1231 Encounter for screening mammogram for malignant neoplasm of breast: Secondary | ICD-10-CM

## 2013-10-07 ENCOUNTER — Other Ambulatory Visit: Payer: Self-pay | Admitting: Obstetrics and Gynecology

## 2013-10-07 DIAGNOSIS — R928 Other abnormal and inconclusive findings on diagnostic imaging of breast: Secondary | ICD-10-CM

## 2013-10-21 ENCOUNTER — Ambulatory Visit
Admission: RE | Admit: 2013-10-21 | Discharge: 2013-10-21 | Disposition: A | Discharge status: Home / Self Care | Payer: BC Managed Care – PPO | Source: Ambulatory Visit | Attending: Obstetrics and Gynecology | Admitting: Obstetrics and Gynecology

## 2013-10-21 DIAGNOSIS — R928 Other abnormal and inconclusive findings on diagnostic imaging of breast: Secondary | ICD-10-CM

## 2014-02-23 ENCOUNTER — Telehealth: Payer: Self-pay

## 2014-02-23 NOTE — Telephone Encounter (Signed)
New Patient  Left message for call back Identifiable  Pap- DUE MM[facility]- 10/02/13---additional imaging needed; 10/21/13--left breast--negative Td- DUE

## 2014-02-24 ENCOUNTER — Encounter: Payer: Self-pay | Admitting: Family Medicine

## 2014-02-24 ENCOUNTER — Telehealth: Payer: Self-pay

## 2014-02-24 ENCOUNTER — Ambulatory Visit (INDEPENDENT_AMBULATORY_CARE_PROVIDER_SITE_OTHER): Payer: BC Managed Care – PPO | Admitting: Family Medicine

## 2014-02-24 VITALS — BP 100/64 | HR 64 | Temp 98.0°F | Ht 66.0 in | Wt 117.4 lb

## 2014-02-24 DIAGNOSIS — E876 Hypokalemia: Secondary | ICD-10-CM

## 2014-02-24 DIAGNOSIS — G43709 Chronic migraine without aura, not intractable, without status migrainosus: Secondary | ICD-10-CM

## 2014-02-24 DIAGNOSIS — Z8349 Family history of other endocrine, nutritional and metabolic diseases: Secondary | ICD-10-CM

## 2014-02-24 DIAGNOSIS — Z0000 Encounter for general adult medical examination without abnormal findings: Secondary | ICD-10-CM

## 2014-02-24 LAB — HEPATIC FUNCTION PANEL
ALT: 13 U/L (ref 0–35)
AST: 16 U/L (ref 0–37)
Albumin: 3.9 g/dL (ref 3.5–5.2)
Alkaline Phosphatase: 43 U/L (ref 39–117)
Bilirubin, Direct: 0.1 mg/dL (ref 0.0–0.3)
Total Bilirubin: 0.6 mg/dL (ref 0.2–1.2)
Total Protein: 7.3 g/dL (ref 6.0–8.3)

## 2014-02-24 LAB — LIPID PANEL
Cholesterol: 208 mg/dL — ABNORMAL HIGH (ref 0–200)
HDL: 54.9 mg/dL (ref >39.00)
LDL Cholesterol: 137 mg/dL — ABNORMAL HIGH (ref 0–99)
NonHDL: 153.1
Total CHOL/HDL Ratio: 4
Triglycerides: 81 mg/dL (ref 0.0–149.0)
VLDL: 16.2 mg/dL (ref 0.0–40.0)

## 2014-02-24 LAB — T3, FREE: T3, Free: 2.4 pg/mL (ref 2.3–4.2)

## 2014-02-24 LAB — CBC WITH DIFFERENTIAL/PLATELET
Basophils Absolute: 0 10*3/uL (ref 0.0–0.1)
Basophils Relative: 0.5 % (ref 0.0–3.0)
Eosinophils Absolute: 0.2 10*3/uL (ref 0.0–0.7)
Eosinophils Relative: 2 % (ref 0.0–5.0)
HCT: 37.6 % (ref 36.0–46.0)
Hemoglobin: 12.8 g/dL (ref 12.0–15.0)
Lymphocytes Relative: 33.7 % (ref 12.0–46.0)
Lymphs Abs: 2.8 10*3/uL (ref 0.7–4.0)
MCHC: 34 g/dL (ref 30.0–36.0)
MCV: 92.7 fl (ref 78.0–100.0)
Monocytes Absolute: 0.6 10*3/uL (ref 0.1–1.0)
Monocytes Relative: 6.6 % (ref 3.0–12.0)
Neutro Abs: 4.8 10*3/uL (ref 1.4–7.7)
Neutrophils Relative %: 57.2 % (ref 43.0–77.0)
Platelets: 265 10*3/uL (ref 150.0–400.0)
RBC: 4.05 Mil/uL (ref 3.87–5.11)
RDW: 13.8 % (ref 11.5–15.5)
WBC: 8.4 10*3/uL (ref 4.0–10.5)

## 2014-02-24 LAB — BASIC METABOLIC PANEL
BUN: 15 mg/dL (ref 6–23)
CO2: 20 mEq/L (ref 19–32)
Calcium: 8.9 mg/dL (ref 8.4–10.5)
Chloride: 107 mEq/L (ref 96–112)
Creatinine, Ser: 0.9 mg/dL (ref 0.4–1.2)
GFR: 71.26 mL/min (ref >60.00)
Glucose, Bld: 97 mg/dL (ref 70–99)
Potassium: 2.5 mEq/L — CL (ref 3.5–5.1)
Sodium: 134 mEq/L — ABNORMAL LOW (ref 135–145)

## 2014-02-24 LAB — T4, FREE: Free T4: 1.19 ng/dL (ref 0.60–1.60)

## 2014-02-24 LAB — TSH: TSH: 0.66 u[IU]/mL (ref 0.35–4.50)

## 2014-02-24 MED ORDER — TRAMADOL HCL 50 MG PO TABS: 50.0000 mg | ORAL_TABLET | Freq: Three times a day (TID) | ORAL | Status: DC | PRN

## 2014-02-24 MED ORDER — TOPIRAMATE 50 MG PO TABS: 50.0000 mg | ORAL_TABLET | Freq: Two times a day (BID) | ORAL | Status: DC

## 2014-02-24 NOTE — Telephone Encounter (Signed)
Medication and allergies:  Reviewed and updated  90 day supply/mail order: n/a Local pharmacy:  [place], [place]   Immunizations due: UTD   A/P: No changes to personal, family history or past surgical hx Pap- 11/2012--per patient; scheduled to see Dr. Ronita Hipps ([facility]) this Thursday [facility]- 10/02/13---additional imaging needed; 10/21/13--left breast--negative  Td- 05/2013 per patient   To Discuss with Provider: Nothing at this time.

## 2014-02-24 NOTE — Progress Notes (Signed)
Pre visit review using our clinic review tool, if applicable. No additional management support is needed unless otherwise documented below in the visit note. 

## 2014-02-24 NOTE — Patient Instructions (Addendum)
Migraine Headache A migraine headache is an intense, throbbing pain on one or both sides of your head. A migraine can last for 30 minutes to several hours. CAUSES  The exact cause of a migraine headache is not always known. However, a migraine may be caused when nerves in the brain become irritated and release chemicals that cause inflammation. This causes pain. Certain things may also trigger migraines, such as:  Alcohol.  Smoking.  Stress.  Menstruation.  Aged cheeses.  Foods or drinks that contain nitrates, glutamate, aspartame, or tyramine.  Lack of sleep.  Chocolate.  Caffeine.  Hunger.  Physical exertion.  Fatigue.  Medicines used to treat chest pain (nitroglycerine), birth control pills, estrogen, and some blood pressure medicines. SIGNS AND SYMPTOMS  Pain on one or both sides of your head.  Pulsating or throbbing pain.  Severe pain that prevents daily activities.  Pain that is aggravated by any physical activity.  Nausea, vomiting, or both.  Dizziness.  Pain with exposure to bright lights, loud noises, or activity.  General sensitivity to bright lights, loud noises, or smells. Before you get a migraine, you may get warning signs that a migraine is coming (aura). An aura may include:  Seeing flashing lights.  Seeing bright spots, halos, or zig-zag lines.  Having tunnel vision or blurred vision.  Having feelings of numbness or tingling.  Having trouble talking.  Having muscle weakness. DIAGNOSIS  A migraine headache is often diagnosed based on:  Symptoms.  Physical exam.  A CT scan or MRI of your head. These imaging tests cannot diagnose migraines, but they can help rule out other causes of headaches. TREATMENT Medicines may be given for pain and nausea. Medicines can also be given to help prevent recurrent migraines.  HOME CARE INSTRUCTIONS  Only take over-the-counter or prescription medicines for pain or discomfort as directed by your  health care provider. The use of long-term narcotics is not recommended.  Lie down in a dark, quiet room when you have a migraine.  Keep a journal to find out what may trigger your migraine headaches. For example, write down:  What you eat and drink.  How much sleep you get.  Any change to your diet or medicines.  Limit alcohol consumption.  Quit smoking if you smoke.  Get 7-9 hours of sleep, or as recommended by your health care provider.  Limit stress.  Keep lights dim if bright lights bother you and make your migraines worse. SEEK IMMEDIATE MEDICAL CARE IF:   Your migraine becomes severe.  You have a fever.  You have a stiff neck.  You have vision loss.  You have muscular weakness or loss of muscle control.  You start losing your balance or have trouble walking.  You feel faint or pass out.  You have severe symptoms that are different from your first symptoms. MAKE SURE YOU:   Understand these instructions.  Will watch your condition.  Will get help right away if you are not doing well or get worse. Document Released: 08/14/2005 Document Revised: 06/04/2013 Document Reviewed: 04/21/2013 [facility] Patient Information 2015 [place], [place]. This information is not intended to replace advice given to you by your health care provider. Make sure you discuss any questions you have with your health care provider.  Preventive Care for Adults A healthy lifestyle and preventive care can promote health and wellness. Preventive health guidelines for women include the following key practices.  A routine yearly physical is a good way to check with your health  care provider about your health and preventive screening. It is a chance to share any concerns and updates on your health and to receive a thorough exam.  Visit your dentist for a routine exam and preventive care every 6 months. Brush your teeth twice a day and floss once a day. Good oral hygiene prevents tooth decay and  gum disease.  The frequency of eye exams is based on your age, health, family medical history, use of contact lenses, and other factors. Follow your health care provider's recommendations for frequency of eye exams.  Eat a healthy diet. Foods like vegetables, fruits, whole grains, low-fat dairy products, and lean protein foods contain the nutrients you need without too many calories. Decrease your intake of foods high in solid fats, added sugars, and salt. Eat the right amount of calories for you.Get information about a proper diet from your health care provider, if necessary.  Regular physical exercise is one of the most important things you can do for your health. Most adults should get at least 150 minutes of moderate-intensity exercise (any activity that increases your heart rate and causes you to sweat) each week. In addition, most adults need muscle-strengthening exercises on 2 or more days a week.  Maintain a healthy weight. The body mass index (BMI) is a screening tool to identify possible weight problems. It provides an estimate of body fat based on height and weight. Your health care provider can find your BMI, and can help you achieve or maintain a healthy weight.For adults 20 years and older:  A BMI below 18.5 is considered underweight.  A BMI of 18.5 to 24.9 is normal.  A BMI of 25 to 29.9 is considered overweight.  A BMI of 30 and above is considered obese.  Maintain normal blood lipids and cholesterol levels by exercising and minimizing your intake of saturated fat. Eat a balanced diet with plenty of fruit and vegetables. Blood tests for lipids and cholesterol should begin at age 48 and be repeated every 5 years. If your lipid or cholesterol levels are high, you are over 50, or you are at high risk for heart disease, you may need your cholesterol levels checked more frequently.Ongoing high lipid and cholesterol levels should be treated with medicines if diet and exercise are not  working.  If you smoke, find out from your health care provider how to quit. If you do not use tobacco, do not start.  Lung cancer screening is recommended for adults aged 27-80 years who are at high risk for developing lung cancer because of a history of smoking. A yearly low-dose CT scan of the lungs is recommended for people who have at least a 30-pack-year history of smoking and are a current smoker or have quit within the past 15 years. A pack year of smoking is smoking an average of 1 pack of cigarettes a day for 1 year (for example: 1 pack a day for 30 years or 2 packs a day for 15 years). Yearly screening should continue until the smoker has stopped smoking for at least 15 years. Yearly screening should be stopped for people who develop a health problem that would prevent them from having lung cancer treatment.  If you are pregnant, do not drink alcohol. If you are breastfeeding, be very cautious about drinking alcohol. If you are not pregnant and choose to drink alcohol, do not have more than 1 drink per day. One drink is considered to be 12 ounces (355 mL) of beer, 5  ounces (148 mL) of wine, or 1.5 ounces (44 mL) of liquor.  Avoid use of street drugs. Do not share needles with anyone. Ask for help if you need support or instructions about stopping the use of drugs.  High blood pressure causes heart disease and increases the risk of stroke. Your blood pressure should be checked at least every 1 to 2 years. Ongoing high blood pressure should be treated with medicines if weight loss and exercise do not work.  If you are 81-69 years old, ask your health care provider if you should take aspirin to prevent strokes.  Diabetes screening involves taking a blood sample to check your fasting blood sugar level. This should be done once every 3 years, after age 9, if you are within normal weight and without risk factors for diabetes. Testing should be considered at a younger age or be carried out more  frequently if you are overweight and have at least 1 risk factor for diabetes.  Breast cancer screening is essential preventive care for women. You should practice "breast self-awareness." This means understanding the normal appearance and feel of your breasts and may include breast self-examination. Any changes detected, no matter how small, should be reported to a health care provider. Women in their 30s and 30s should have a clinical breast exam (CBE) by a health care provider as part of a regular health exam every 1 to 3 years. After age 23, women should have a CBE every year. Starting at age 47, women should consider having a mammogram (breast X-ray test) every year. Women who have a family history of breast cancer should talk to their health care provider about genetic screening. Women at a high risk of breast cancer should talk to their health care providers about having an MRI and a mammogram every year.  Breast cancer gene (BRCA)-related cancer risk assessment is recommended for women who have family members with BRCA-related cancers. BRCA-related cancers include breast, ovarian, tubal, and peritoneal cancers. Having family members with these cancers may be associated with an increased risk for harmful changes (mutations) in the breast cancer genes BRCA1 and BRCA2. Results of the assessment will determine the need for genetic counseling and BRCA1 and BRCA2 testing.  Routine pelvic exams to screen for cancer are no longer recommended for nonpregnant women who are considered low risk for cancer of the pelvic organs (ovaries, uterus, and vagina) and who do not have symptoms. Ask your health care provider if a screening pelvic exam is right for you.  If you have had past treatment for cervical cancer or a condition that could lead to cancer, you need Pap tests and screening for cancer for at least 20 years after your treatment. If Pap tests have been discontinued, your risk factors (such as having a new  sexual partner) need to be reassessed to determine if screening should be resumed. Some women have medical problems that increase the chance of getting cervical cancer. In these cases, your health care provider may recommend more frequent screening and Pap tests.  The HPV test is an additional test that may be used for cervical cancer screening. The HPV test looks for the virus that can cause the cell changes on the cervix. The cells collected during the Pap test can be tested for HPV. The HPV test could be used to screen women aged 48 years and older, and should be used in women of any age who have unclear Pap test results. After the age of 92, women  should have HPV testing at the same frequency as a Pap test.  Colorectal cancer can be detected and often prevented. Most routine colorectal cancer screening begins at the age of 30 years and continues through age 91 years. However, your health care provider may recommend screening at an earlier age if you have risk factors for colon cancer. On a yearly basis, your health care provider may provide home test kits to check for hidden blood in the stool. Use of a small camera at the end of a tube, to directly examine the colon (sigmoidoscopy or colonoscopy), can detect the earliest forms of colorectal cancer. Talk to your health care provider about this at age 56, when routine screening begins. Direct exam of the colon should be repeated every 5-10 years through age 23 years, unless early forms of pre-cancerous polyps or small growths are found.  People who are at an increased risk for hepatitis B should be screened for this virus. You are considered at high risk for hepatitis B if:  You were born in a country where hepatitis B occurs often. Talk with your health care provider about which countries are considered high risk.  Your parents were born in a high-risk country and you have not received a shot to protect against hepatitis B (hepatitis B  vaccine).  You have HIV or AIDS.  You use needles to inject street drugs.  You live with, or have sex with, someone who has Hepatitis B.  You get hemodialysis treatment.  You take certain medicines for conditions like cancer, organ transplantation, and autoimmune conditions.  Hepatitis C blood testing is recommended for all people born from [birth year] through 1966 and any individual with known risks for hepatitis C.  Practice safe sex. Use condoms and avoid high-risk sexual practices to reduce the spread of sexually transmitted infections (STIs). STIs include gonorrhea, chlamydia, syphilis, trichomonas, herpes, HPV, and human immunodeficiency virus (HIV). Herpes, HIV, and HPV are viral illnesses that have no cure. They can result in disability, cancer, and death.  You should be screened for sexually transmitted illnesses (STIs) including gonorrhea and chlamydia if:  You are sexually active and are younger than 24 years.  You are older than 24 years and your health care provider tells you that you are at risk for this type of infection.  Your sexual activity has changed since you were last screened and you are at an increased risk for chlamydia or gonorrhea. Ask your health care provider if you are at risk.  If you are at risk of being infected with HIV, it is recommended that you take a prescription medicine daily to prevent HIV infection. This is called preexposure prophylaxis (PrEP). You are considered at risk if:  You are a heterosexual woman, are sexually active, and are at increased risk for HIV infection.  You take drugs by injection.  You are sexually active with a partner who has HIV.  Talk with your health care provider about whether you are at high risk of being infected with HIV. If you choose to begin PrEP, you should first be tested for HIV. You should then be tested every 3 months for as long as you are taking PrEP.  Osteoporosis is a disease in which the bones lose minerals  and strength with aging. This can result in serious bone fractures or breaks. The risk of osteoporosis can be identified using a bone density scan. Women ages 67 years and over and women at risk for fractures or osteoporosis should  discuss screening with their health care providers. Ask your health care provider whether you should take a calcium supplement or vitamin D to reduce the rate of osteoporosis.  Menopause can be associated with physical symptoms and risks. Hormone replacement therapy is available to decrease symptoms and risks. You should talk to your health care provider about whether hormone replacement therapy is right for you.  Use sunscreen. Apply sunscreen liberally and repeatedly throughout the day. You should seek shade when your shadow is shorter than you. Protect yourself by wearing long sleeves, pants, a wide-brimmed hat, and sunglasses year round, whenever you are outdoors.  Once a month, do a whole body skin exam, using a mirror to look at the skin on your back. Tell your health care provider of new moles, moles that have irregular borders, moles that are larger than a pencil eraser, or moles that have changed in shape or color.  Stay current with required vaccines (immunizations).  Influenza vaccine. All adults should be immunized every year.  Tetanus, diphtheria, and acellular pertussis (Td, Tdap) vaccine. Pregnant women should receive 1 dose of Tdap vaccine during each pregnancy. The dose should be obtained regardless of the length of time since the last dose. Immunization is preferred during the 27th-36th week of gestation. An adult who has not previously received Tdap or who does not know her vaccine status should receive 1 dose of Tdap. This initial dose should be followed by tetanus and diphtheria toxoids (Td) booster doses every 10 years. Adults with an unknown or incomplete history of completing a 3-dose immunization series with Td-containing vaccines should begin or  complete a primary immunization series including a Tdap dose. Adults should receive a Td booster every 10 years.  Varicella vaccine. An adult without evidence of immunity to varicella should receive 2 doses or a second dose if she has previously received 1 dose. Pregnant females who do not have evidence of immunity should receive the first dose after pregnancy. This first dose should be obtained before leaving the health care facility. The second dose should be obtained 4-8 weeks after the first dose.  Human papillomavirus (HPV) vaccine. Females aged 13-26 years who have not received the vaccine previously should obtain the 3-dose series. The vaccine is not recommended for use in pregnant females. However, pregnancy testing is not needed before receiving a dose. If a female is found to be pregnant after receiving a dose, no treatment is needed. In that case, the remaining doses should be delayed until after the pregnancy. Immunization is recommended for any person with an immunocompromised condition through the age of 94 years if she did not get any or all doses earlier. During the 3-dose series, the second dose should be obtained 4-8 weeks after the first dose. The third dose should be obtained 24 weeks after the first dose and 16 weeks after the second dose.  Zoster vaccine. One dose is recommended for adults aged 25 years or older unless certain conditions are present.  Measles, mumps, and rubella (MMR) vaccine. Adults born before [birth year] generally are considered immune to measles and mumps. Adults born in [birth year] or later should have 1 or more doses of MMR vaccine unless there is a contraindication to the vaccine or there is laboratory evidence of immunity to each of the three diseases. A routine second dose of MMR vaccine should be obtained at least 28 days after the first dose for students attending postsecondary schools, health care workers, or international travelers. People who received inactivated  measles vaccine or an unknown type of measles vaccine during 1963-1967 should receive 2 doses of MMR vaccine. People who received inactivated mumps vaccine or an unknown type of mumps vaccine before 1979 and are at high risk for mumps infection should consider immunization with 2 doses of MMR vaccine. For females of childbearing age, rubella immunity should be determined. If there is no evidence of immunity, females who are not pregnant should be vaccinated. If there is no evidence of immunity, females who are pregnant should delay immunization until after pregnancy. Unvaccinated health care workers born before [birth year] who lack laboratory evidence of measles, mumps, or rubella immunity or laboratory confirmation of disease should consider measles and mumps immunization with 2 doses of MMR vaccine or rubella immunization with 1 dose of MMR vaccine.  Pneumococcal 13-valent conjugate (PCV13) vaccine. When indicated, a person who is uncertain of her immunization history and has no record of immunization should receive the PCV13 vaccine. An adult aged 66 years or older who has certain medical conditions and has not been previously immunized should receive 1 dose of PCV13 vaccine. This PCV13 should be followed with a dose of pneumococcal polysaccharide (PPSV23) vaccine. The PPSV23 vaccine dose should be obtained at least 8 weeks after the dose of PCV13 vaccine. An adult aged 63 years or older who has certain medical conditions and previously received 1 or more doses of PPSV23 vaccine should receive 1 dose of PCV13. The PCV13 vaccine dose should be obtained 1 or more years after the last PPSV23 vaccine dose.  Pneumococcal polysaccharide (PPSV23) vaccine. When PCV13 is also indicated, PCV13 should be obtained first. All adults aged 11 years and older should be immunized. An adult younger than age 56 years who has certain medical conditions should be immunized. Any person who resides in a nursing home or long-term care  facility should be immunized. An adult smoker should be immunized. People with an immunocompromised condition and certain other conditions should receive both PCV13 and PPSV23 vaccines. People with human immunodeficiency virus (HIV) infection should be immunized as soon as possible after diagnosis. Immunization during chemotherapy or radiation therapy should be avoided. Routine use of PPSV23 vaccine is not recommended for American Indians, [place] Natives, or people younger than 65 years unless there are medical conditions that require PPSV23 vaccine. When indicated, people who have unknown immunization and have no record of immunization should receive PPSV23 vaccine. One-time revaccination 5 years after the first dose of PPSV23 is recommended for people aged 19-64 years who have chronic kidney failure, nephrotic syndrome, asplenia, or immunocompromised conditions. People who received 1-2 doses of PPSV23 before age 59 years should receive another dose of PPSV23 vaccine at age 74 years or later if at least 5 years have passed since the previous dose. Doses of PPSV23 are not needed for people immunized with PPSV23 at or after age 48 years.  Meningococcal vaccine. Adults with asplenia or persistent complement component deficiencies should receive 2 doses of quadrivalent meningococcal conjugate (MenACWY-D) vaccine. The doses should be obtained at least 2 months apart. Microbiologists working with certain meningococcal bacteria, [employer] recruits, people at risk during an outbreak, and people who travel to or live in countries with a high rate of meningitis should be immunized. A first-year college student up through age 27 years who is living in a residence hall should receive a dose if she did not receive a dose on or after her 16th birthday. Adults who have certain high-risk conditions should receive one or more doses of  vaccine.  Hepatitis A vaccine. Adults who wish to be protected from this disease, have certain  high-risk conditions, work with hepatitis A-infected animals, work in hepatitis A research labs, or travel to or work in countries with a high rate of hepatitis A should be immunized. Adults who were previously unvaccinated and who anticipate close contact with an international adoptee during the first 60 days after arrival in the [place] from a country with a high rate of hepatitis A should be immunized.  Hepatitis B vaccine. Adults who wish to be protected from this disease, have certain high-risk conditions, may be exposed to blood or other infectious body fluids, are household contacts or sex partners of hepatitis B positive people, are clients or workers in certain care facilities, or travel to or work in countries with a high rate of hepatitis B should be immunized.  Haemophilus influenzae type b (Hib) vaccine. A previously unvaccinated person with asplenia or sickle cell disease or having a scheduled splenectomy should receive 1 dose of Hib vaccine. Regardless of previous immunization, a recipient of a hematopoietic stem cell transplant should receive a 3-dose series 6-12 months after her successful transplant. Hib vaccine is not recommended for adults with HIV infection. Preventive Services / Frequency Ages 85 to 39years  Blood pressure check.** / Every 1 to 2 years.  Lipid and cholesterol check.** / Every 5 years beginning at age 74.  Clinical breast exam.** / Every 3 years for women in their 37s and 46s.  BRCA-related cancer risk assessment.** / For women who have family members with a BRCA-related cancer (breast, ovarian, tubal, or peritoneal cancers).  Pap test.** / Every 2 years from ages 80 through 57. Every 3 years starting at age 51 through age 26 or 41 with a history of 3 consecutive normal Pap tests.  HPV screening.** / Every 3 years from ages 4 through ages 55 to 87 with a history of 3 consecutive normal Pap tests.  Hepatitis C blood test.** / For any individual with  known risks for hepatitis C.  Skin self-exam. / Monthly.  Influenza vaccine. / Every year.  Tetanus, diphtheria, and acellular pertussis (Tdap, Td) vaccine.** / Consult your health care provider. Pregnant women should receive 1 dose of Tdap vaccine during each pregnancy. 1 dose of Td every 10 years.  Varicella vaccine.** / Consult your health care provider. Pregnant females who do not have evidence of immunity should receive the first dose after pregnancy.  HPV vaccine. / 3 doses over 6 months, if 56 and younger. The vaccine is not recommended for use in pregnant females. However, pregnancy testing is not needed before receiving a dose.  Measles, mumps, rubella (MMR) vaccine.** / You need at least 1 dose of MMR if you were born in 1957 or later. You may also need a 2nd dose. For females of childbearing age, rubella immunity should be determined. If there is no evidence of immunity, females who are not pregnant should be vaccinated. If there is no evidence of immunity, females who are pregnant should delay immunization until after pregnancy.  Pneumococcal 13-valent conjugate (PCV13) vaccine.** / Consult your health care provider.  Pneumococcal polysaccharide (PPSV23) vaccine.** / 1 to 2 doses if you smoke cigarettes or if you have certain conditions.  Meningococcal vaccine.** / 1 dose if you are age 57 to 5 years and a Market researcher living in a residence hall, or have one of several medical conditions, you need to get vaccinated against meningococcal disease. You may also  need additional booster doses.  Hepatitis A vaccine.** / Consult your health care provider.  Hepatitis B vaccine.** / Consult your health care provider.  Haemophilus influenzae type b (Hib) vaccine.** / Consult your health care provider. Ages 57 to 64years  Blood pressure check.** / Every 1 to 2 years.  Lipid and cholesterol check.** / Every 5 years beginning at age 6 years.  Lung cancer screening. /  Every year if you are aged 14-80 years and have a 30-pack-year history of smoking and currently smoke or have quit within the past 15 years. Yearly screening is stopped once you have quit smoking for at least 15 years or develop a health problem that would prevent you from having lung cancer treatment.  Clinical breast exam.** / Every year after age 45 years.  BRCA-related cancer risk assessment.** / For women who have family members with a BRCA-related cancer (breast, ovarian, tubal, or peritoneal cancers).  Mammogram.** / Every year beginning at age 53 years and continuing for as long as you are in good health. Consult with your health care provider.  Pap test.** / Every 3 years starting at age 53 years through age 8 or 63 years with a history of 3 consecutive normal Pap tests.  HPV screening.** / Every 3 years from ages 29 years through ages 75 to 76 years with a history of 3 consecutive normal Pap tests.  Fecal occult blood test (FOBT) of stool. / Every year beginning at age 36 years and continuing until age 67 years. You may not need to do this test if you get a colonoscopy every 10 years.  Flexible sigmoidoscopy or colonoscopy.** / Every 5 years for a flexible sigmoidoscopy or every 10 years for a colonoscopy beginning at age 73 years and continuing until age 69 years.  Hepatitis C blood test.** / For all people born from [birth year] through 1966 and any individual with known risks for hepatitis C.  Skin self-exam. / Monthly.  Influenza vaccine. / Every year.  Tetanus, diphtheria, and acellular pertussis (Tdap/Td) vaccine.** / Consult your health care provider. Pregnant women should receive 1 dose of Tdap vaccine during each pregnancy. 1 dose of Td every 10 years.  Varicella vaccine.** / Consult your health care provider. Pregnant females who do not have evidence of immunity should receive the first dose after pregnancy.  Zoster vaccine.** / 1 dose for adults aged 66 years or  older.  Measles, mumps, rubella (MMR) vaccine.** / You need at least 1 dose of MMR if you were born in 1957 or later. You may also need a 2nd dose. For females of childbearing age, rubella immunity should be determined. If there is no evidence of immunity, females who are not pregnant should be vaccinated. If there is no evidence of immunity, females who are pregnant should delay immunization until after pregnancy.  Pneumococcal 13-valent conjugate (PCV13) vaccine.** / Consult your health care provider.  Pneumococcal polysaccharide (PPSV23) vaccine.** / 1 to 2 doses if you smoke cigarettes or if you have certain conditions.  Meningococcal vaccine.** / Consult your health care provider.  Hepatitis A vaccine.** / Consult your health care provider.  Hepatitis B vaccine.** / Consult your health care provider.  Haemophilus influenzae type b (Hib) vaccine.** / Consult your health care provider. Ages 24 years and over  Blood pressure check.** / Every 1 to 2 years.  Lipid and cholesterol check.** / Every 5 years beginning at age 58 years.  Lung cancer screening. / Every year if you are aged 82-80  years and have a 30-pack-year history of smoking and currently smoke or have quit within the past 15 years. Yearly screening is stopped once you have quit smoking for at least 15 years or develop a health problem that would prevent you from having lung cancer treatment.  Clinical breast exam.** / Every year after age 75 years.  BRCA-related cancer risk assessment.** / For women who have family members with a BRCA-related cancer (breast, ovarian, tubal, or peritoneal cancers).  Mammogram.** / Every year beginning at age 30 years and continuing for as long as you are in good health. Consult with your health care provider.  Pap test.** / Every 3 years starting at age 65 years through age 72 or 8 years with 3 consecutive normal Pap tests. Testing can be stopped between 65 and 70 years with 3 consecutive  normal Pap tests and no abnormal Pap or HPV tests in the past 10 years.  HPV screening.** / Every 3 years from ages 88 years through ages 43 or 63 years with a history of 3 consecutive normal Pap tests. Testing can be stopped between 65 and 70 years with 3 consecutive normal Pap tests and no abnormal Pap or HPV tests in the past 10 years.  Fecal occult blood test (FOBT) of stool. / Every year beginning at age 26 years and continuing until age 82 years. You may not need to do this test if you get a colonoscopy every 10 years.  Flexible sigmoidoscopy or colonoscopy.** / Every 5 years for a flexible sigmoidoscopy or every 10 years for a colonoscopy beginning at age 79 years and continuing until age 33 years.  Hepatitis C blood test.** / For all people born from [birth year] through 1966 and any individual with known risks for hepatitis C.  Osteoporosis screening.** / A one-time screening for women ages 44 years and over and women at risk for fractures or osteoporosis.  Skin self-exam. / Monthly.  Influenza vaccine. / Every year.  Tetanus, diphtheria, and acellular pertussis (Tdap/Td) vaccine.** / 1 dose of Td every 10 years.  Varicella vaccine.** / Consult your health care provider.  Zoster vaccine.** / 1 dose for adults aged 30 years or older.  Pneumococcal 13-valent conjugate (PCV13) vaccine.** / Consult your health care provider.  Pneumococcal polysaccharide (PPSV23) vaccine.** / 1 dose for all adults aged 43 years and older.  Meningococcal vaccine.** / Consult your health care provider.  Hepatitis A vaccine.** / Consult your health care provider.  Hepatitis B vaccine.** / Consult your health care provider.  Haemophilus influenzae type b (Hib) vaccine.** / Consult your health care provider. ** Family history and personal history of risk and conditions may change your health care provider's recommendations. Document Released: 10/10/2001 Document Revised: 08/19/2013 Document Reviewed:  01/09/2011 [facility] Patient Information 2015 [place], [place]. This information is not intended to replace advice given to you by your health care provider. Make sure you discuss any questions you have with your health care provider.

## 2014-02-24 NOTE — Telephone Encounter (Signed)
Pt not on any meds to dec K--- eat K rich foods--- take kcl 20 meq 1 po qd for 2 weeks-- recheck 2 weeks

## 2014-02-24 NOTE — Telephone Encounter (Signed)
Patient returned call.  Call back.

## 2014-02-24 NOTE — Progress Notes (Signed)
Subjective:     Emily Park is a 43 y.o. female and is here for a comprehensive physical exam. The patient reports problems - migraines.  History   Social History  . Marital Status: Married    Spouse Name: N/A    Number of Children: N/A  . Years of Education: N/A   Occupational History  . 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 Smoking status: Never Smoker   . Smokeless tobacco: Not on file  . Alcohol Use: Yes     Comment: Wine---once or twice a month  . Drug Use: No  . Sexual Activity: Yes    Partners: Male     Comment: Married   Other Topics Concern  . Not on file   Social History Narrative   Exercise-- no   Health Maintenance  Topic Date Due  . Influenza Vaccine  03/28/2014  . Pap Smear  11/27/2015  . Tetanus/tdap  05/29/2023    The following portions of the patient's history were reviewed and updated as appropriate:  She  has a past medical history of Depression; Migraine; and Arrhythmia. She  does not have a problem list on file. She  has past surgical history that includes Breast lumpectomy; Cardioversion; and Ablation. Her family history includes Cancer in her mother; Depression in her mother; Hypertension in her father; Hyperthyroidism in her sister; Hypothyroidism in her mother and sister. She  reports that she has never smoked. She does not have any smokeless tobacco history on file. She reports that she drinks alcohol. She reports that she does not use illicit drugs. She has a current medication list which includes the following prescription(s): fluoxetine and tramadol. No current outpatient prescriptions on file prior to visit.   No current facility-administered medications on file prior to visit.   She is allergic to amoxicillin..  Review of Systems Review of Systems  Constitutional: Negative for activity change, appetite change and fatigue.  HENT: Negative for hearing loss, congestion, tinnitus and ear discharge.  dentist q12m Eyes:  Negative for visual disturbance (see optho q1y -- vision corrected to 20/20 with glasses).  Respiratory: Negative for cough, chest tightness and shortness of breath.   Cardiovascular: Negative for chest pain, palpitations and leg swelling.  Gastrointestinal: Negative for abdominal pain, diarrhea, constipation and abdominal distention.  Genitourinary: Negative for urgency, frequency, decreased urine volume and difficulty urinating.  Musculoskeletal: Negative for back pain, arthralgias and gait problem.  Skin: Negative for color change, pallor and rash.  Neurological: Negative for dizziness, light-headedness, numbness and headaches.  Hematological: Negative for adenopathy. Does not bruise/bleed easily.  Psychiatric/Behavioral: Negative for suicidal ideas, confusion, sleep disturbance, self-injury, dysphoric mood, decreased concentration and agitation.       Objective:    BP 100/64  Pulse 64  Temp(Src) 98 F (36.7 C) (Oral)  Ht 5\' 6"  (1.676 m)  Wt 117 lb 6.4 oz (53.252 kg)  BMI 18.96 kg/m2  SpO2 97%  LMP 02/01/2014 General appearance: alert, cooperative, appears stated age and no distress Head: Normocephalic, without obvious abnormality, atraumatic Eyes: conjunctivae/corneas clear. PERRL, EOM's intact. Fundi benign. Ears: normal TM's and external ear canals both ears Nose: Nares normal. Septum midline. Mucosa normal. No drainage or sinus tenderness. Throat: lips, mucosa, and tongue normal; teeth and gums normal Neck: no adenopathy, supple, symmetrical, trachea midline and thyroid not enlarged, symmetric, no tenderness/mass/nodules Back: symmetric, no curvature. ROM normal. No CVA tenderness. Lungs: clear to auscultation bilaterally Breasts: deferred--- gyn Heart: regular rate and  rhythm, S1, S2 normal, no murmur, click, rub or gallop Abdomen: soft, non-tender; bowel sounds normal; no masses,  no organomegaly Pelvic: deferred Extremities: extremities normal, atraumatic, no cyanosis  or edema Pulses: 2+ and symmetric Skin: Skin color, texture, turgor normal. No rashes or lesions Lymph nodes: Cervical, supraclavicular, and axillary nodes normal. Neurologic: Alert and oriented X 3, normal strength and tone. Normal symmetric reflexes. Normal coordination and gait Psych-- no depression, no axiety      Assessment:    Healthy female exam.      Plan:     1. Chronic migraine without aura without status migrainosus, not intractable Consider neuro referral    Increase topamax 50 mg bid - traMADol (ULTRAM) 50 MG tablet; Take 1 tablet (50 mg total) by mouth every 8 (eight) hours as needed.  Dispense: 30 tablet; Refill: 0  2. Family history of thyroid disease Check labs - TSH - T3, free - T4, free  3. Preventative health care  - Basic metabolic panel - CBC with Differential - Hepatic function panel - Lipid panel - POCT urinalysis dipstick  See After Visit Summary for Counseling Recommendations

## 2014-02-24 NOTE — Telephone Encounter (Signed)
Call from [facility] at the lab with a critical potassium level of 2.5 please advise     KP

## 2014-02-25 LAB — POCT URINALYSIS DIPSTICK
Bilirubin, UA: NEGATIVE
Blood, UA: NEGATIVE
Glucose, UA: NEGATIVE
Ketones, UA: NEGATIVE
Leukocytes, UA: NEGATIVE
Nitrite, UA: NEGATIVE
Spec Grav, UA: 1.025
Urobilinogen, UA: 0.2
pH, UA: 6

## 2014-02-25 MED ORDER — POTASSIUM CHLORIDE CRYS ER 20 MEQ PO TBCR: 20.0000 meq | EXTENDED_RELEASE_TABLET | Freq: Every day | ORAL | Status: DC

## 2014-02-25 NOTE — Addendum Note (Signed)
Addended by: Rudene Anda on: 02/25/2014 11:34 AM   Modules accepted: Orders

## 2014-02-25 NOTE — Telephone Encounter (Signed)
Msg left to call the office     KP 

## 2014-02-25 NOTE — Telephone Encounter (Addendum)
Dr. Nonda Lou instructions were discussed with patient.  Pt stated understanding and was in agreement with plan.    Rx sent to [facility] is aware Lab appointment scheduled for July 16 at 9:30 am. Future lab order placed for BMP for recheck in 2 weeks.

## 2014-03-12 ENCOUNTER — Other Ambulatory Visit (INDEPENDENT_AMBULATORY_CARE_PROVIDER_SITE_OTHER): Payer: BC Managed Care – PPO

## 2014-03-12 DIAGNOSIS — E876 Hypokalemia: Secondary | ICD-10-CM

## 2014-03-12 LAB — BASIC METABOLIC PANEL
BUN: 11 mg/dL (ref 6–23)
CO2: 16 mEq/L — ABNORMAL LOW (ref 19–32)
Calcium: 8.9 mg/dL (ref 8.4–10.5)
Chloride: 110 mEq/L (ref 96–112)
Creatinine, Ser: 1 mg/dL (ref 0.4–1.2)
GFR: 63.9 mL/min (ref >60.00)
Glucose, Bld: 89 mg/dL (ref 70–99)
Potassium: 2.9 mEq/L — ABNORMAL LOW (ref 3.5–5.1)
Sodium: 136 mEq/L (ref 135–145)

## 2014-03-16 ENCOUNTER — Other Ambulatory Visit: Payer: Self-pay | Admitting: General Practice

## 2014-03-16 MED ORDER — POTASSIUM CHLORIDE CRYS ER 20 MEQ PO TBCR: 20.0000 meq | EXTENDED_RELEASE_TABLET | Freq: Two times a day (BID) | ORAL | Status: DC

## 2014-03-25 ENCOUNTER — Ambulatory Visit: Payer: BC Managed Care – PPO | Admitting: Family Medicine

## 2014-09-02 ENCOUNTER — Encounter (HOSPITAL_COMMUNITY)
Admission: RE | Admit: 2014-09-02 | Discharge: 2014-09-02 | Disposition: A | Discharge status: Home / Self Care | Payer: 59 | Source: Ambulatory Visit | Attending: Obstetrics and Gynecology | Admitting: Obstetrics and Gynecology

## 2014-09-02 ENCOUNTER — Encounter (HOSPITAL_COMMUNITY): Payer: Self-pay

## 2014-09-02 DIAGNOSIS — Z01818 Encounter for other preprocedural examination: Secondary | ICD-10-CM | POA: Diagnosis not present

## 2014-09-02 LAB — BASIC METABOLIC PANEL
Anion gap: 7 (ref 5–15)
BUN: 16 mg/dL (ref 6–23)
CO2: 28 mmol/L (ref 19–32)
Calcium: 9.4 mg/dL (ref 8.4–10.5)
Chloride: 100 mEq/L (ref 96–112)
Creatinine, Ser: 1.03 mg/dL (ref 0.50–1.10)
GFR calc Af Amer: 75 mL/min — ABNORMAL LOW (ref >90)
GFR calc non Af Amer: 65 mL/min — ABNORMAL LOW (ref >90)
Glucose, Bld: 74 mg/dL (ref 70–99)
Potassium: 2.5 mmol/L — CL (ref 3.5–5.1)
Sodium: 135 mmol/L (ref 135–145)

## 2014-09-02 LAB — CBC
HCT: 40.4 % (ref 36.0–46.0)
Hemoglobin: 14.4 g/dL (ref 12.0–15.0)
MCH: 33 pg (ref 26.0–34.0)
MCHC: 35.6 g/dL (ref 30.0–36.0)
MCV: 92.4 fL (ref 78.0–100.0)
Platelets: 339 10*3/uL (ref 150–400)
RBC: 4.37 MIL/uL (ref 3.87–5.11)
RDW: 12.4 % (ref 11.5–15.5)
WBC: 10.1 10*3/uL (ref 4.0–10.5)

## 2014-09-02 NOTE — Progress Notes (Signed)
LEFT VM AT OFFICE INFORMING THEM OF  K+ 2.5.. INFORMED TO CALL PATIENT .Marland KitchenMarland Kitchen

## 2014-09-02 NOTE — Progress Notes (Signed)
SPOKE TO DR Ronita Hipps AND INFORMED PATIENT'S K+ 2.5 . LEFT VM FOR PATIENT TO TAKE K+ PILLS BID

## 2014-09-02 NOTE — Patient Instructions (Signed)
   Your procedure is scheduled on: JAN 15 [facility] 2016 AT 1115AM  Enter through the Main Entrance of [facility] at: [place] up the phone at the desk and dial [phone] and inform us of your arrival.  Please call this number if you have any problems the morning of surgery: [phone]  Remember: Do not eat food after midnight:JAN 14 Do not drink clear liquids after: 7AM ON JAN 15 Take these medicines the morning of surgery with a SIP OF WATER:  Do not wear jewelry, make-up, or FINGER nail polish No metal in your hair or on your body. Do not wear lotions, powders, perfumes.  You may wear deodorant.  Do not bring valuables to the hospital. Contacts, dentures or bridgework may not be worn into surgery.  Leave suitcase in the car. After Surgery it may be brought to your room. For patients being admitted to the hospital, checkout time is 11:00am the day of discharge.    Patients discharged on the day of surgery will not be allowed to drive home.

## 2014-09-07 ENCOUNTER — Other Ambulatory Visit: Payer: Self-pay | Admitting: Obstetrics and Gynecology

## 2014-09-11 ENCOUNTER — Encounter (HOSPITAL_COMMUNITY)
Admission: RE | Disposition: A | Discharge status: Home / Self Care | Payer: Self-pay | Source: Ambulatory Visit | Attending: Obstetrics and Gynecology

## 2014-09-11 ENCOUNTER — Ambulatory Visit (HOSPITAL_COMMUNITY): Payer: 59 | Admitting: Anesthesiology

## 2014-09-11 ENCOUNTER — Ambulatory Visit (HOSPITAL_COMMUNITY)
Admission: RE | Admit: 2014-09-11 | Discharge: 2014-09-11 | Disposition: A | Discharge status: Home / Self Care | Payer: 59 | Source: Ambulatory Visit | Attending: Obstetrics and Gynecology | Admitting: Obstetrics and Gynecology

## 2014-09-11 ENCOUNTER — Encounter (HOSPITAL_COMMUNITY): Payer: Self-pay | Admitting: Anesthesiology

## 2014-09-11 DIAGNOSIS — Z79899 Other long term (current) drug therapy: Secondary | ICD-10-CM | POA: Diagnosis not present

## 2014-09-11 DIAGNOSIS — F329 Major depressive disorder, single episode, unspecified: Secondary | ICD-10-CM | POA: Diagnosis not present

## 2014-09-11 DIAGNOSIS — N840 Polyp of corpus uteri: Secondary | ICD-10-CM | POA: Insufficient documentation

## 2014-09-11 DIAGNOSIS — N920 Excessive and frequent menstruation with regular cycle: Secondary | ICD-10-CM | POA: Diagnosis present

## 2014-09-11 HISTORY — PX: DILITATION & CURRETTAGE/HYSTROSCOPY WITH NOVASURE ABLATION: SHX5568

## 2014-09-11 LAB — POTASSIUM: Potassium: 3.2 mmol/L — ABNORMAL LOW (ref 3.5–5.1)

## 2014-09-11 LAB — PREGNANCY, URINE: Preg Test, Ur: NEGATIVE

## 2014-09-11 SURGERY — DILATATION & CURETTAGE/HYSTEROSCOPY WITH NOVASURE ABLATION
Anesthesia: General | Site: Vagina

## 2014-09-11 MED ORDER — MIDAZOLAM HCL 5 MG/5ML IJ SOLN
INTRAMUSCULAR | Status: DC | PRN
  Administered 2014-09-11: 2 mg via INTRAVENOUS

## 2014-09-11 MED ORDER — FENTANYL CITRATE 0.05 MG/ML IJ SOLN
INTRAMUSCULAR | Status: AC
  Filled 2014-09-11: qty 2

## 2014-09-11 MED ORDER — MIDAZOLAM HCL 2 MG/2ML IJ SOLN
INTRAMUSCULAR | Status: AC
  Filled 2014-09-11: qty 2

## 2014-09-11 MED ORDER — LIDOCAINE HCL (CARDIAC) 20 MG/ML IV SOLN
INTRAVENOUS | Status: DC | PRN
Start: 2014-09-11 — End: 2014-09-11
  Administered 2014-09-11: 50 mg via INTRAVENOUS

## 2014-09-11 MED ORDER — HYDROCODONE-IBUPROFEN 7.5-200 MG PO TABS: 1.0000 | ORAL_TABLET | Freq: Three times a day (TID) | ORAL | Status: DC | PRN

## 2014-09-11 MED ORDER — LIDOCAINE HCL (CARDIAC) 20 MG/ML IV SOLN
INTRAVENOUS | Status: AC
  Filled 2014-09-11: qty 5

## 2014-09-11 MED ORDER — DEXAMETHASONE SODIUM PHOSPHATE 4 MG/ML IJ SOLN
INTRAMUSCULAR | Status: DC | PRN
  Administered 2014-09-11: 4 mg via INTRAVENOUS

## 2014-09-11 MED ORDER — KETOROLAC TROMETHAMINE 30 MG/ML IJ SOLN
INTRAMUSCULAR | Status: AC
  Filled 2014-09-11: qty 1

## 2014-09-11 MED ORDER — MEPERIDINE HCL 25 MG/ML IJ SOLN: 6.2500 mg | INTRAMUSCULAR | Status: DC | PRN

## 2014-09-11 MED ORDER — PROPOFOL 10 MG/ML IV BOLUS
INTRAVENOUS | Status: DC | PRN
  Administered 2014-09-11: 150 mg via INTRAVENOUS

## 2014-09-11 MED ORDER — SCOPOLAMINE 1 MG/3DAYS TD PT72
MEDICATED_PATCH | TRANSDERMAL | Status: AC
  Administered 2014-09-11: 1.5 mg via TRANSDERMAL
  Filled 2014-09-11: qty 1

## 2014-09-11 MED ORDER — CEFAZOLIN SODIUM-DEXTROSE 2-3 GM-% IV SOLR
2.0000 g | INTRAVENOUS | Status: AC
  Administered 2014-09-11: 2 g via INTRAVENOUS

## 2014-09-11 MED ORDER — FENTANYL CITRATE 0.05 MG/ML IJ SOLN
INTRAMUSCULAR | Status: DC | PRN
  Administered 2014-09-11 (×2): 50 ug via INTRAVENOUS

## 2014-09-11 MED ORDER — BUPIVACAINE HCL (PF) 0.25 % IJ SOLN
INTRAMUSCULAR | Status: AC
  Filled 2014-09-11: qty 30

## 2014-09-11 MED ORDER — KETOROLAC TROMETHAMINE 30 MG/ML IJ SOLN
INTRAMUSCULAR | Status: DC | PRN
  Administered 2014-09-11: 30 mg via INTRAVENOUS

## 2014-09-11 MED ORDER — PROPOFOL 10 MG/ML IV BOLUS
INTRAVENOUS | Status: AC
  Filled 2014-09-11: qty 20

## 2014-09-11 MED ORDER — LACTATED RINGERS IV SOLN
INTRAVENOUS | Status: DC
  Administered 2014-09-11: 11:00:00 via INTRAVENOUS

## 2014-09-11 MED ORDER — METOCLOPRAMIDE HCL 5 MG/ML IJ SOLN: 10.0000 mg | Freq: Once | INTRAMUSCULAR | Status: DC | PRN

## 2014-09-11 MED ORDER — CEFAZOLIN SODIUM-DEXTROSE 2-3 GM-% IV SOLR
INTRAVENOUS | Status: AC
  Filled 2014-09-11: qty 50

## 2014-09-11 MED ORDER — SCOPOLAMINE 1 MG/3DAYS TD PT72
1.0000 | MEDICATED_PATCH | Freq: Once | TRANSDERMAL | Status: DC
  Administered 2014-09-11: 1.5 mg via TRANSDERMAL

## 2014-09-11 MED ORDER — LACTATED RINGERS IR SOLN
Status: DC | PRN
  Administered 2014-09-11: 3000 mL

## 2014-09-11 MED ORDER — FENTANYL CITRATE 0.05 MG/ML IJ SOLN: 25.0000 ug | INTRAMUSCULAR | Status: DC | PRN

## 2014-09-11 MED ORDER — ONDANSETRON HCL 4 MG/2ML IJ SOLN
INTRAMUSCULAR | Status: AC
  Filled 2014-09-11: qty 2

## 2014-09-11 MED ORDER — DEXAMETHASONE SODIUM PHOSPHATE 4 MG/ML IJ SOLN
INTRAMUSCULAR | Status: AC
  Filled 2014-09-11: qty 1

## 2014-09-11 MED ORDER — BUPIVACAINE HCL (PF) 0.25 % IJ SOLN
INTRAMUSCULAR | Status: DC | PRN
  Administered 2014-09-11: 20 mL

## 2014-09-11 MED ORDER — ONDANSETRON HCL 4 MG/2ML IJ SOLN
INTRAMUSCULAR | Status: DC | PRN
  Administered 2014-09-11: 4 mg via INTRAVENOUS

## 2014-09-11 SURGICAL SUPPLY — 15 items
ABLATOR ENDOMETRIAL BIPOLAR (ABLATOR) ×2 IMPLANT
CATH ROBINSON RED A/P 16FR (CATHETERS) ×2 IMPLANT
CLOTH BEACON ORANGE TIMEOUT ST (SAFETY) ×2 IMPLANT
CONTAINER PREFILL 10% NBF 60ML (FORM) ×4 IMPLANT
GLOVE BIO SURGEON STRL SZ7 (GLOVE) ×1 IMPLANT
GLOVE BIO SURGEON STRL SZ7.5 (GLOVE) ×2 IMPLANT
GOWN STRL REUS W/TWL LRG LVL3 (GOWN DISPOSABLE) ×5 IMPLANT
PACK VAGINAL MINOR WOMEN LF (CUSTOM PROCEDURE TRAY) ×2 IMPLANT
PAD OB MATERNITY 4.3X12.25 (PERSONAL CARE ITEMS) ×2 IMPLANT
PAD PREP 24X48 CUFFED NSTRL (MISCELLANEOUS) ×2 IMPLANT
SYR TB 1ML 25GX5/8 (SYRINGE) ×2 IMPLANT
TOWEL OR 17X24 6PK STRL BLUE (TOWEL DISPOSABLE) ×4 IMPLANT
TUBING AQUILEX INFLOW (TUBING) ×2 IMPLANT
TUBING AQUILEX OUTFLOW (TUBING) ×2 IMPLANT
WATER STERILE IRR 1000ML POUR (IV SOLUTION) ×2 IMPLANT

## 2014-09-11 NOTE — Anesthesia Postprocedure Evaluation (Signed)
  Anesthesia Post-op Note  Patient: Emily Park  Procedure(s) Performed: Procedure(s): DILATATION & CURETTAGE/HYSTEROSCOPY WITH NOVASURE ABLATION (N/A)  Patient Location: PACU  Anesthesia Type:General  Level of Consciousness: awake, alert  and oriented  Airway and Oxygen Therapy: Patient Spontanous Breathing  Post-op Pain: none  Post-op Assessment: Post-op Vital signs reviewed, Patient's Cardiovascular Status Stable, Respiratory Function Stable, Patent Airway, No signs of Nausea or vomiting and Pain level controlled  Post-op Vital Signs: Reviewed and stable  Last Vitals:  Filed Vitals:   09/11/14 1130  BP: 108/59  Pulse: 63  Temp:   Resp: 16    Complications: No apparent anesthesia complications

## 2014-09-11 NOTE — Discharge Instructions (Signed)
Post Anesthesia Home Care Instructions  Activity: Get plenty of rest for the remainder of the day. A responsible adult should stay with you for 24 hours following the procedure.  For the next 24 hours, DO NOT: -Drive a car -Paediatric nurse -Drink alcoholic beverages -Take any medication unless instructed by your physician -Make any legal decisions or sign important papers.  Meals: Start with liquid foods such as gelatin or soup. Progress to regular foods as tolerated. Avoid greasy, spicy, heavy foods. If nausea and/or vomiting occur, drink only clear liquids until the nausea and/or vomiting subsides. Call your physician if vomiting continues.  Special Instructions/Symptoms: Your throat may feel dry or sore from the anesthesia or the breathing tube placed in your throat during surgery. If this causes discomfort, gargle with warm salt water. The discomfort should disappear within 24 hours. DISCHARGE INSTRUCTIONS: HYSTEROSCOPY / ENDOMETRIAL ABLATION The following instructions have been prepared to help you care for yourself upon your return home.  Personal hygiene:  Use sanitary pads for vaginal drainage, not tampons.  Shower the day after your procedure.  NO tub baths, pools or Jacuzzis for 2-3 weeks.  Wipe front to back after using the bathroom.  Activity and limitations:  Do NOT drive or operate any equipment for 24 hours. The effects of anesthesia are still present and drowsiness may result.  Do NOT rest in bed all day.  Walking is encouraged.  Walk up and down stairs slowly.  You may resume your normal activity in one to two days or as indicated by your physician. Sexual activity: NO intercourse for at least 2 weeks after the procedure, or as indicated by your Doctor.  Diet: Eat a light meal as desired this evening. You may resume your usual diet tomorrow.  Return to Work: You may resume your work activities in one to two days or as indicated by  Marine scientist.  What to expect after your surgery: Expect to have vaginal bleeding/discharge for 2-3 days and spotting for up to 10 days. It is not unusual to have soreness for up to 1-2 weeks. You may have a slight burning sensation when you urinate for the first day. Mild cramps may continue for a couple of days. You may have a regular period in 2-6 weeks.  Call your doctor for any of the following:  Excessive vaginal bleeding or clotting, saturating and changing one pad every hour.  Inability to urinate 6 hours after discharge from hospital.  Pain not relieved by pain medication.  Fever of 100.4 F or greater.  Unusual vaginal discharge or odor.  Return to office _________________Call for an appointment ___________________ Patients signature: ______________________ Nurses signature ________________________  Post Anesthesia Care Unit [phone] INSTRUCTIONS: HYSTEROSCOPY / ENDOMETRIAL ABLATION The following instructions have been prepared to help you care for yourself upon your return home.  Personal hygiene:  Use sanitary pads for vaginal drainage, not tampons.  Shower the day after your procedure.  NO tub baths, pools or Jacuzzis for 2-3 weeks.  Wipe front to back after using the bathroom.  Activity and limitations:  Do NOT drive or operate any equipment for 24 hours. The effects of anesthesia are still present and drowsiness may result.  Do NOT rest in bed all day.  Walking is encouraged.  Walk up and down stairs slowly.  You may resume your normal activity in one to two days or as indicated by your physician. Sexual activity: NO intercourse for at least 2 weeks after the procedure, or as indicated  by your Doctor.  Diet: Eat a light meal as desired this evening. You may resume your usual diet tomorrow.  Return to Work: You may resume your work activities in one to two days or as indicated by Marine scientist.  What to expect after your surgery:  Expect to have vaginal bleeding/discharge for 2-3 days and spotting for up to 10 days. It is not unusual to have soreness for up to 1-2 weeks. You may have a slight burning sensation when you urinate for the first day. Mild cramps may continue for a couple of days. You may have a regular period in 2-6 weeks.  Call your doctor for any of the following:  Excessive vaginal bleeding or clotting, saturating and changing one pad every hour.  Inability to urinate 6 hours after discharge from hospital.  Pain not relieved by pain medication.  Fever of 100.4 F or greater.  Unusual vaginal discharge or odor.  Return to office _________________Call for an appointment ___________________ Patients signature: ______________________ Nurses signature ________________________  [place] [facility] [phone]

## 2014-09-11 NOTE — Op Note (Signed)
NAMEERCELL, PERLMAN                 ACCOUNT NO.:  [id number]  MEDICAL RECORD NO.:  [id number]  LOCATION:  [facility]                          FACILITY:  [facility]  PHYSICIAN:  Lovenia Kim, M.D.DATE OF BIRTH:  [birth year]  DATE OF PROCEDURE: DATE OF DISCHARGE:                              OPERATIVE REPORT   PREOPERATIVE DIAGNOSIS:  Refractory menorrhagia.  POSTOPERATIVE DIAGNOSIS:  Refractory menorrhagia.  PROCEDURE:  Diagnostic hysteroscopy with D and C, endometrial polypectomy, NovaSure endometrial ablation.  SURGEON:  Lovenia Kim, M.D.  ASSISTANT:  None.  ANESTHESIA:  General and local.  ESTIMATED BLOOD LOSS:  Less than 50 mL.  FLUID DEFICIT:  120 mL.  COMPLICATIONS:  None.  DRAINS:  None.  COUNTS:  Correct.  DISPOSITION:  The patient to recovery in good condition.  SPECIMEN:  Endometrial curettings and polyp to pathology.  BRIEF OPERATIVE NOTE:  After being apprised of the risks of anesthesia, infection, bleeding, injury to surrounding organs, possible need for repair, delayed versus immediate complications to include bowel and bladder injury, and possible need for repair, the patient was brought to the operating room where she was administered general anesthetic without complications.  Prepped and draped in usual sterile fashion. Catheterized until the bladder was empty.  Feet were placed in [place].  Exam under anesthesia reveals an anteflexed uterus and no adnexal masses.  At this time, dilute paracervical block placed using 20 mL of 0.25% Marcaine solution.  Cervix was easily dilated up to a #27 Pratt dilator.  Hysteroscope placed.  Visualization reveals posterior wall endometrial polyp, bilateral normal tubal ostia.  At this time, D and C was performed using sharp curettage in a 4-quadrant method. Specimen and polyp are removed using polyp forceps, all sent together. The procedure was then switched to the NovaSure ablation whereby the device was  seated in the standard fashion to a width of 4.3 and a length of 6 to predetermined wattage after CO2 test was negative.  The procedure was initiated for the appropriate length of time as determined by the NovaSure device.  At the end of the procedure, the risks device was removed and inspected and found to be notable for a well ablated cavity without evidence of uterine perforation.  Good hemostasis noted.  All instruments removed. The patient tolerated the procedure well, was awakened, transferred to recovery in good condition.     Lovenia Kim, M.D.     RJT/MEDQ  D:  09/11/2014  T:  09/11/2014  Job:  [id number]

## 2014-09-11 NOTE — Anesthesia Preprocedure Evaluation (Signed)
Anesthesia Evaluation  Patient identified by MRN, date of birth, ID band Patient awake    Reviewed: Allergy & Precautions, NPO status , Patient's Chart, lab work & pertinent test results  Airway Mallampati: II  TM Distance: >3 FB Neck ROM: Full    Dental no notable dental hx. (+) Teeth Intact   Pulmonary neg pulmonary ROS,  breath sounds clear to auscultation  Pulmonary exam normal       Cardiovascular negative cardio ROS  + dysrhythmias Rhythm:Regular Rate:Normal     Neuro/Psych  Headaches, PSYCHIATRIC DISORDERS Depression    GI/Hepatic negative GI ROS, Neg liver ROS,   Endo/Other  negative endocrine ROSHypokalemia  Renal/GU negative Renal ROS  negative genitourinary   Musculoskeletal negative musculoskeletal ROS (+)   Abdominal   Peds  Hematology negative hematology ROS (+)   Anesthesia Other Findings   Reproductive/Obstetrics Menorrhagia ? Endometrial polyp                             Anesthesia Physical Anesthesia Plan  ASA: II  Anesthesia Plan: General   Post-op Pain Management:    Induction: Intravenous  Airway Management Planned: LMA  Additional Equipment:   Intra-op Plan:   Post-operative Plan: Extubation in OR  Informed Consent: I have reviewed the patients History and Physical, chart, labs and discussed the procedure including the risks, benefits and alternatives for the proposed anesthesia with the patient or authorized representative who has indicated his/her understanding and acceptance.   Dental advisory given  Plan Discussed with: CRNA, Anesthesiologist and Surgeon  Anesthesia Plan Comments:         Anesthesia Quick Evaluation

## 2014-09-11 NOTE — Transfer of Care (Signed)
Immediate Anesthesia Transfer of Care Note  Patient: Emily Park  Procedure(s) Performed: Procedure(s): DILATATION & CURETTAGE/HYSTEROSCOPY WITH NOVASURE ABLATION (N/A)  Patient Location: PACU  Anesthesia Type:General  Level of Consciousness: awake, alert  and oriented  Airway & Oxygen Therapy: Patient Spontanous Breathing and Patient connected to nasal cannula oxygen  Post-op Assessment: Report given to PACU RN  Post vital signs: Reviewed  Complications: No apparent anesthesia complications

## 2014-09-11 NOTE — Op Note (Signed)
09/11/2014  11:04 AM  PATIENT:  Emily Park  44 y.o. female  PRE-OPERATIVE DIAGNOSIS:  Menorrhagia  POST-OPERATIVE DIAGNOSIS:  Menorrhagia Endometrial polyp  PROCEDURE:  Procedure(s): DILATATION & CURETTAGE DIAGNOSTIC HYSTEROSCOPY WITH NOVASURE ENDOMETRIAL ABLATION ENDOMETRIAL POLYPECTOMY  SURGEON:  Surgeon(s): Lovenia Kim, MD  ASSISTANTS: none   ANESTHESIA:   local and general  ESTIMATED BLOOD LOSS: minimal FLUID DEFICIT: 125cc  DRAINS: none   LOCAL MEDICATIONS USED:  MARCAINE    and Amount: 20 ml  SPECIMEN:  Source of Specimen:  EMC with polyp  DISPOSITION OF SPECIMEN:  PATHOLOGY  COUNTS:  YES  DICTATION #: [id number]  PLAN OF CARE: dc home  PATIENT DISPOSITION:  PACU - hemodynamically stable.

## 2014-09-11 NOTE — H&P (Signed)
Emily Park, Emily Park                 ACCOUNT NO.:  [id number]  MEDICAL RECORD NO.:  [id number]  LOCATION:                                 FACILITY:  PHYSICIAN:  Lovenia Kim, M.D.DATE OF BIRTH:  [birth year]  DATE OF ADMISSION: DATE OF DISCHARGE:                             HISTORY & PHYSICAL   CHIEF COMPLAINT:  Refractory menorrhagia.  HISTORY OF PRESENT ILLNESS:  She is a 44 year old white female, G4, P2, who presents for definitive therapy of refractory menorrhagia.  MEDICATIONS:  Include potassium, Topamax, fluoxetine, and Xanax.  SOCIAL HISTORY:  She is a nonsmoker, nondrinker.  Denies domestic or physical violence.  ALLERGIES:  She has allergies to Zithromax and Vicodin.  FAMILY HISTORY:  Thyroid disease, heart disease, breast cancer, diabetes, and hypertension.  PAST SURGICAL HISTORY:  Remarkable for breast mass excision in 2004.  PHYSICAL EXAMINATION:  GENERAL:  She is a well-developed, well- nourished, white female, in no acute distress. HEENT:  Normal. NECK:  Supple.  Full range of motion. LUNGS:  Clear. HEART:  Regular rhythm. ABDOMEN:  Soft, nontender. PELVIC:  Normal size uterus.  No adnexal masses. EXTREMITIES:  There are no cords. NEUROLOGIC:  Nonfocal. SKIN:  Intact.  IMPRESSION:  Refractory menorrhagia for definitive therapy.  PLAN:  Proceed with diagnostic hysteroscopy, D and C, NovaSure endometrial ablation.  Risks of anesthesia, infection, bleeding, injury to surrounding organs, possible need for repair discussed, delayed versus immediate complications to include bowel and bladder injury noted.  The patient acknowledges and wishes to proceed.     Lovenia Kim, M.D.     RJT/MEDQ  D:  09/10/2014  T:  09/10/2014  Job:  [id number]

## 2014-09-14 ENCOUNTER — Encounter (HOSPITAL_COMMUNITY): Payer: Self-pay | Admitting: Obstetrics and Gynecology

## 2015-01-18 ENCOUNTER — Other Ambulatory Visit: Payer: Self-pay | Admitting: Family Medicine

## 2015-02-12 ENCOUNTER — Other Ambulatory Visit: Payer: Self-pay | Admitting: Family Medicine

## 2015-02-25 ENCOUNTER — Telehealth: Payer: Self-pay | Admitting: Family Medicine

## 2015-02-25 MED ORDER — FLUOXETINE HCL 20 MG PO CAPS: 60.0000 mg | ORAL_CAPSULE | Freq: Every day | ORAL | Status: DC

## 2015-02-25 NOTE — Telephone Encounter (Signed)
Caller name: Ellisha Bankson Relationship to patient: self Can be reached: [phone]  Reason for call: Pt called for appt stating that she had to make one before she can get refill on Prozac. First acute available is 03/09/15 5:00pm. Pt states that she is out of meds and wants to know if it can be called in until appt or if we can see her sooner. Please call pt back. I am out of the office from 12:30 today until tomorrow morning.

## 2015-02-25 NOTE — Telephone Encounter (Signed)
Ok to refill until appointment? 

## 2015-02-25 NOTE — Telephone Encounter (Signed)
Rx faxed.    KP 

## 2015-02-25 NOTE — Telephone Encounter (Signed)
Apt scheduled for 03/09/15, the patient has not had the Rx filled by you, just entered as a Historical med when she established. Last seen June 2015. Please advise     KP

## 2015-03-09 ENCOUNTER — Encounter: Payer: Self-pay | Admitting: Family Medicine

## 2015-03-09 ENCOUNTER — Ambulatory Visit (INDEPENDENT_AMBULATORY_CARE_PROVIDER_SITE_OTHER): Payer: 59 | Admitting: Family Medicine

## 2015-03-09 VITALS — BP 102/80 | HR 95 | Temp 98.4°F | Ht 66.0 in | Wt 118.8 lb

## 2015-03-09 DIAGNOSIS — G4726 Circadian rhythm sleep disorder, shift work type: Secondary | ICD-10-CM

## 2015-03-09 DIAGNOSIS — G43709 Chronic migraine without aura, not intractable, without status migrainosus: Secondary | ICD-10-CM | POA: Diagnosis not present

## 2015-03-09 DIAGNOSIS — F411 Generalized anxiety disorder: Secondary | ICD-10-CM | POA: Diagnosis not present

## 2015-03-09 DIAGNOSIS — L679 Hair color and hair shaft abnormality, unspecified: Secondary | ICD-10-CM

## 2015-03-09 MED ORDER — TOPIRAMATE 50 MG PO TABS: ORAL_TABLET | ORAL | Status: DC

## 2015-03-09 MED ORDER — NUVIGIL 200 MG PO TABS: 1.0000 | ORAL_TABLET | ORAL | Status: DC | PRN

## 2015-03-09 MED ORDER — FLUOXETINE HCL 20 MG PO CAPS: 60.0000 mg | ORAL_CAPSULE | Freq: Every day | ORAL | Status: DC

## 2015-03-09 MED ORDER — SPIRONOLACTONE 25 MG PO TABS: 25.0000 mg | ORAL_TABLET | Freq: Every day | ORAL | Status: DC

## 2015-03-09 NOTE — Progress Notes (Signed)
Pre visit review using our clinic review tool, if applicable. No additional management support is needed unless otherwise documented below in the visit note. 

## 2015-03-09 NOTE — Patient Instructions (Signed)
Armodafinil tablets What is this medicine? ARMODAFINIL (ar moe DAF i nil) is used to treat excessive sleepiness caused by certain sleep disorders. This includes narcolepsy, sleep apnea, and shift work sleep disorder. This medicine may be used for other purposes; ask your health care provider or pharmacist if you have questions. COMMON BRAND NAME(S): Nuvigil What should I tell my health care provider before I take this medicine? They need to know if you have any of these conditions: -kidney disease -liver disease -an unusual or allergic reaction to armodafinil, modafinil, medicines, foods, dyes, or preservatives -pregnant or trying to get pregnant -breast-feeding How should I use this medicine? Take this medicine by mouth with a glass of water. Follow the directions on the prescription label. Take your doses at regular intervals. Do not take your medicine more often than directed. Do not stop taking except on your doctor's advice. A special MedGuide will be given to you by the pharmacist with each prescription and refill. Be sure to read this information carefully each time. Talk to your pediatrician regarding the use of this medicine in children. While this drug may be prescribed for children as young as 66 years of age for selected conditions, precautions do apply. Overdosage: If you think you have taken too much of this medicine contact a poison control center or emergency room at once. NOTE: This medicine is only for you. Do not share this medicine with others. What if I miss a dose? If you miss a dose, take it as soon as you can. If it is almost time for your next dose, take only that dose. Do not take double or extra doses. What may interact with this medicine? Do not take this medicine with any of the following medications: -amphetamine or dextroamphetamine -dexmethylphenidate or methylphenidate -MAOIs like Carbex, Eldepryl, Marplan, Nardil, and Parnate -pemoline -procarbazine This  medicine may also interact with the following medications: -antifungal medicines like itraconazole or ketoconazole -barbiturates, like phenobarbital -birth control pills or other hormone-containing birth control devices or implants -carbamazepine -cyclosporine -diazepam -medicines for depression, anxiety, or psychotic disturbances -phenytoin -propranolol -triazolam -warfarin This list may not describe all possible interactions. Give your health care provider a list of all the medicines, herbs, non-prescription drugs, or dietary supplements you use. Also tell them if you smoke, drink alcohol, or use illegal drugs. Some items may interact with your medicine. What should I watch for while using this medicine? Visit your doctor or health care professional for regular checks on your progress. The full effect of this medicine may not be seen right away. This medicine may affect your concentration, function, or may hide signs that you are tired. You may get dizzy. Do not drive, use machinery, or do anything that needs mental alertness until you know how this drug affects you. Alcohol can make you more dizzy and may interfere with your response to this medicine or your alertness. Avoid alcoholic drinks. Birth control pills may not work properly while you are taking this medicine. Talk to your doctor about using an extra method of birth control. It is unknown if the effects of this medicine will be increased by the use of caffeine. Caffeine is found in many foods, beverages, and medications. Ask your doctor if you should limit or change your intake of caffeine-containing products while on this medicine. What side effects may I notice from receiving this medicine? Side effects that you should report to your doctor or health care professional as soon as possible: -allergic reactions like skin  rash, itching or hives, swelling of the face, lips, or tongue -breathing problems -chest pain -fast, irregular  heartbeat -increased blood pressure, particularly if you have high blood pressure -mental problems -sore throat, fever, or chills -tremors -vomiting Side effects that usually do not require medical attention (report to your doctor or health care professional if they continue or are bothersome): -headache -nausea, diarrhea, or stomach upset -nervousness -trouble sleeping This list may not describe all possible side effects. Call your doctor for medical advice about side effects. You may report side effects to FDA at 1-800-FDA-1088. Where should I keep my medicine? Keep out of the reach of children. Store at room temperature between 20 and 25 degrees C (68 and 77 degrees F). Throw away any unused medicine after the expiration date. NOTE: This sheet is a summary. It may not cover all possible information. If you have questions about this medicine, talk to your doctor, pharmacist, or health care provider.  2015, Elsevier/Gold Standard. (2009-07-27 21:10:28)  

## 2015-03-09 NOTE — Progress Notes (Signed)
Patient ID: ZELIE ASBILL, female    DOB: 16-Oct-1970  Age: 44 y.o. MRN: [id number]    Subjective:  Subjective HPI Emily Park presents for f/u meds and she needs refills.  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 Her anxiety has been well controlled with prozac and xanax. Review of Systems  Constitutional: Negative for diaphoresis, appetite change, fatigue and unexpected weight change.  Eyes: Negative for pain, redness and visual disturbance.  Respiratory: Negative for cough, chest tightness, shortness of breath and wheezing.   Cardiovascular: Negative for chest pain, palpitations and leg swelling.  Endocrine: Negative for cold intolerance, heat intolerance, polydipsia, polyphagia and polyuria.  Genitourinary: Negative for dysuria, frequency and difficulty urinating.  Neurological: Negative for dizziness, light-headedness, numbness and headaches.  Psychiatric/Behavioral: Negative for dysphoric mood. The patient is not nervous/anxious.     History Past Medical History  Diagnosis Date  . Depression   . Migraine   . Arrhythmia     She has past surgical history that includes Breast lumpectomy; Cardioversion; Ablation; and Dilatation & currettage/hysteroscopy with novasure ablation (N/A, 09/11/2014).   Her family history includes Cancer in her mother; Depression in her mother; Hypertension in her father; Hyperthyroidism in her sister; Hypothyroidism in her mother and sister.She reports that she has never smoked. She does not have any smokeless tobacco history on file. She reports that she drinks alcohol. She reports that she does not use illicit drugs.  Current Outpatient Prescriptions on File Prior to Visit  Medication Sig Dispense Refill  . Biotin 2.5 MG CAPS Take 2.5 mg by mouth daily.    Marland Kitchen ALPRAZolam (XANAX) 0.5 MG tablet Take 0.5 mg by mouth as needed for anxiety.    Marland Kitchen HYDROcodone-ibuprofen (VICOPROFEN) 7.5-200 MG per tablet Take 1 tablet by mouth  every 8 (eight) hours as needed for moderate pain. (Patient not taking: Reported on 03/09/2015) 30 tablet 0  . potassium chloride SA (K-DUR,KLOR-CON) 20 MEQ tablet Take 1 tablet (20 mEq total) by mouth 2 (two) times daily. (Patient not taking: Reported on 09/01/2014) 60 tablet 3  . traMADol (ULTRAM) 50 MG tablet Take 1 tablet (50 mg total) by mouth every 8 (eight) hours as needed. (Patient not taking: Reported on 03/09/2015) 30 tablet 0   No current facility-administered medications on file prior to visit.     Objective:  Objective Physical Exam  Constitutional: She is oriented to person, place, and time. She appears well-developed and well-nourished.  HENT:  Head: Normocephalic and atraumatic.  Eyes: Conjunctivae and EOM are normal.  Neck: Normal range of motion. Neck supple. No JVD present. Carotid bruit is not present. No thyromegaly present.  Cardiovascular: Normal rate, regular rhythm and normal heart sounds.   No murmur heard. Pulmonary/Chest: Effort normal and breath sounds normal. No respiratory distress. She has no wheezes. She has no rales. She exhibits no tenderness.  Musculoskeletal: She exhibits no edema.  Neurological: She is alert and oriented to person, place, and time.  Psychiatric: She has a normal mood and affect. Her behavior is normal.   BP 102/80 mmHg  Pulse 95  Temp(Src) 98.4 F (36.9 C) (Oral)  Ht 5\' 6"  (1.676 m)  Wt 118 lb 12.8 oz (53.887 kg)  BMI 19.18 kg/m2  SpO2 99% Wt Readings from Last 3 Encounters:  03/09/15 118 lb 12.8 oz (53.887 kg)  09/02/14 120 lb (54.432 kg)  02/24/14 117 lb 6.4 oz (53.252 kg)     Lab Results  Component  Value Date   WBC 10.1 09/02/2014   HGB 14.4 09/02/2014   HCT 40.4 09/02/2014   PLT 339 09/02/2014   GLUCOSE 74 09/02/2014   CHOL 208* 02/24/2014   TRIG 81.0 02/24/2014   HDL 54.90 02/24/2014   LDLCALC 137* 02/24/2014   ALT 13 02/24/2014   AST 16 02/24/2014   NA 135 09/02/2014   K 3.2* 09/11/2014   CL 100 09/02/2014    CREATININE 1.03 09/02/2014   BUN 16 09/02/2014   CO2 28 09/02/2014   TSH 0.66 02/24/2014   INR 0.9 12/14/2007    No results found.   Assessment & Plan:  Plan I have changed Emily Park's topiramate and spironolactone. I am also having her maintain her traMADol, potassium chloride SA, ALPRAZolam, Biotin, HYDROcodone-ibuprofen, FLUoxetine, and NUVIGIL.  Meds ordered this encounter  Medications  . DISCONTD: NUVIGIL 200 MG TABS    Sig: Take 1 tablet by mouth as needed.  Marland Kitchen DISCONTD: spironolactone (ALDACTONE) 25 MG tablet    Sig: Take 12.5 tablets by mouth daily.  Marland Kitchen FLUoxetine (PROZAC) 20 MG capsule    Sig: Take 3 capsules (60 mg total) by mouth daily.    Dispense:  90 capsule    Refill:  3  . topiramate (TOPAMAX) 50 MG tablet    Sig: 3 tab po qd    Dispense:  90 tablet    Refill:  3  . spironolactone (ALDACTONE) 25 MG tablet    Sig: Take 1 tablet (25 mg total) by mouth daily.    Dispense:  90 tablet    Refill:  3  . NUVIGIL 200 MG TABS    Sig: Take 1 tablet by mouth as needed.    Dispense:  90 tablet    Refill:  1    Problem List Items Addressed This Visit    None    Visit Diagnoses    Generalized anxiety disorder    -  Primary    Relevant Medications    FLUoxetine (PROZAC) 20 MG capsule    Shift work sleep disorder        Relevant Medications    NUVIGIL 200 MG TABS    Hair changes        Relevant Medications    spironolactone (ALDACTONE) 25 MG tablet    Chronic migraine without aura without status migrainosus, not intractable        Relevant Medications    FLUoxetine (PROZAC) 20 MG capsule    topiramate (TOPAMAX) 50 MG tablet    spironolactone (ALDACTONE) 25 MG tablet       Follow-up: Return in about 3 months (around 06/09/2015), or if symptoms worsen or fail to improve, for check lab.  Garnet Koyanagi, DO

## 2015-03-16 ENCOUNTER — Telehealth: Payer: Self-pay | Admitting: Family Medicine

## 2015-03-16 DIAGNOSIS — G4726 Circadian rhythm sleep disorder, shift work type: Secondary | ICD-10-CM

## 2015-03-16 MED ORDER — NUVIGIL 200 MG PO TABS: 1.0000 | ORAL_TABLET | ORAL | Status: DC | PRN

## 2015-03-16 NOTE — Telephone Encounter (Addendum)
Relation to pt: self  Call back number: [phone] Pharmacy: [place], [place] [phone] (Phone) [phone] (Fax)         Reason for call:  insurance will cover provigil not NUVIGIL 200 MG TABS. Please advise

## 2015-03-16 NOTE — Telephone Encounter (Signed)
Rx was given during the visit on 03/09/15. I tried calling the patient but the line was busy. Rx re-faxed.    KP

## 2015-03-17 NOTE — Telephone Encounter (Signed)
Patient states that pharmacy has not received this refill. Patient states that provigil needs to be sent in, not nuvigil

## 2015-03-18 ENCOUNTER — Other Ambulatory Visit: Payer: Self-pay | Admitting: Family Medicine

## 2015-03-18 DIAGNOSIS — G4726 Circadian rhythm sleep disorder, shift work type: Secondary | ICD-10-CM

## 2015-03-18 MED ORDER — MODAFINIL 200 MG PO TABS: 200.0000 mg | ORAL_TABLET | Freq: Every day | ORAL | Status: DC

## 2015-03-18 NOTE — Telephone Encounter (Signed)
Provigil is being requested by the patient. Please advise     KP

## 2015-03-18 NOTE — Telephone Encounter (Signed)
PRINTED

## 2015-08-05 ENCOUNTER — Other Ambulatory Visit: Payer: Self-pay | Admitting: Family Medicine

## 2015-08-06 NOTE — Telephone Encounter (Signed)
30 day supply of topiramate and prozac sent to pharmacy. Pt was due for follow up in October. Please call pt to arrange appt before further refills are due. Thanks!

## 2015-08-13 NOTE — Telephone Encounter (Signed)
LVM advising patient of message below °

## 2015-10-14 ENCOUNTER — Other Ambulatory Visit: Payer: Self-pay | Admitting: Family Medicine

## 2015-11-09 ENCOUNTER — Other Ambulatory Visit: Payer: Self-pay | Admitting: Obstetrics and Gynecology

## 2015-11-09 DIAGNOSIS — Z1231 Encounter for screening mammogram for malignant neoplasm of breast: Secondary | ICD-10-CM

## 2015-11-09 DIAGNOSIS — R928 Other abnormal and inconclusive findings on diagnostic imaging of breast: Secondary | ICD-10-CM

## 2015-11-12 ENCOUNTER — Emergency Department (HOSPITAL_COMMUNITY): Payer: 59

## 2015-11-12 ENCOUNTER — Encounter (HOSPITAL_COMMUNITY): Payer: Self-pay | Admitting: Emergency Medicine

## 2015-11-12 ENCOUNTER — Observation Stay (HOSPITAL_COMMUNITY)
Admission: EM | Admit: 2015-11-12 | Discharge: 2015-11-14 | Disposition: A | Discharge status: Home / Self Care | Payer: 59 | Attending: Internal Medicine | Admitting: Internal Medicine

## 2015-11-12 DIAGNOSIS — Z79899 Other long term (current) drug therapy: Secondary | ICD-10-CM | POA: Diagnosis not present

## 2015-11-12 DIAGNOSIS — I471 Supraventricular tachycardia: Principal | ICD-10-CM | POA: Insufficient documentation

## 2015-11-12 DIAGNOSIS — G43909 Migraine, unspecified, not intractable, without status migrainosus: Secondary | ICD-10-CM | POA: Insufficient documentation

## 2015-11-12 DIAGNOSIS — I4581 Long QT syndrome: Secondary | ICD-10-CM | POA: Diagnosis not present

## 2015-11-12 DIAGNOSIS — R918 Other nonspecific abnormal finding of lung field: Secondary | ICD-10-CM | POA: Diagnosis not present

## 2015-11-12 DIAGNOSIS — E876 Hypokalemia: Secondary | ICD-10-CM | POA: Diagnosis present

## 2015-11-12 DIAGNOSIS — F909 Attention-deficit hyperactivity disorder, unspecified type: Secondary | ICD-10-CM | POA: Diagnosis not present

## 2015-11-12 DIAGNOSIS — R002 Palpitations: Secondary | ICD-10-CM | POA: Insufficient documentation

## 2015-11-12 DIAGNOSIS — R000 Tachycardia, unspecified: Secondary | ICD-10-CM | POA: Diagnosis present

## 2015-11-12 DIAGNOSIS — F418 Other specified anxiety disorders: Secondary | ICD-10-CM | POA: Insufficient documentation

## 2015-11-12 DIAGNOSIS — R0789 Other chest pain: Secondary | ICD-10-CM | POA: Diagnosis not present

## 2015-11-12 DIAGNOSIS — R079 Chest pain, unspecified: Secondary | ICD-10-CM

## 2015-11-12 HISTORY — DX: Supraventricular tachycardia: I47.1

## 2015-11-12 HISTORY — DX: Hyperlipidemia, unspecified: E78.5

## 2015-11-12 HISTORY — DX: Supraventricular tachycardia, unspecified: I47.10

## 2015-11-12 LAB — BASIC METABOLIC PANEL
Anion gap: 7 (ref 5–15)
BUN: 12 mg/dL (ref 6–20)
CO2: 23 mmol/L (ref 22–32)
Calcium: 9.2 mg/dL (ref 8.9–10.3)
Chloride: 109 mmol/L (ref 101–111)
Creatinine, Ser: 1.11 mg/dL — ABNORMAL HIGH (ref 0.44–1.00)
GFR calc Af Amer: >60 mL/min (ref >60)
GFR calc non Af Amer: 59 mL/min — ABNORMAL LOW (ref >60)
Glucose, Bld: 78 mg/dL (ref 65–99)
Potassium: 2.7 mmol/L — CL (ref 3.5–5.1)
Sodium: 139 mmol/L (ref 135–145)

## 2015-11-12 LAB — CBC
HCT: 41.5 % (ref 36.0–46.0)
Hemoglobin: 14.5 g/dL (ref 12.0–15.0)
MCH: 32.6 pg (ref 26.0–34.0)
MCHC: 34.9 g/dL (ref 30.0–36.0)
MCV: 93.3 fL (ref 78.0–100.0)
Platelets: 276 10*3/uL (ref 150–400)
RBC: 4.45 MIL/uL (ref 3.87–5.11)
RDW: 12.7 % (ref 11.5–15.5)
WBC: 8.2 10*3/uL (ref 4.0–10.5)

## 2015-11-12 LAB — TROPONIN I: Troponin I: <0.03 ng/mL (ref <0.031)

## 2015-11-12 LAB — I-STAT TROPONIN, ED: Troponin i, poc: 0 ng/mL (ref 0.00–0.08)

## 2015-11-12 MED ORDER — LORAZEPAM 1 MG PO TABS
1.0000 mg | ORAL_TABLET | Freq: Once | ORAL | Status: AC
  Administered 2015-11-12: 1 mg via ORAL
  Filled 2015-11-12: qty 1

## 2015-11-12 MED ORDER — MORPHINE SULFATE (PF) 4 MG/ML IV SOLN
4.0000 mg | Freq: Once | INTRAVENOUS | Status: AC
  Administered 2015-11-12: 4 mg via INTRAVENOUS
  Filled 2015-11-12: qty 1

## 2015-11-12 MED ORDER — SODIUM CHLORIDE 0.9 % IV BOLUS (SEPSIS)
1000.0000 mL | Freq: Once | INTRAVENOUS | Status: AC
  Administered 2015-11-12: 1000 mL via INTRAVENOUS

## 2015-11-12 MED ORDER — POTASSIUM CHLORIDE CRYS ER 20 MEQ PO TBCR
60.0000 meq | EXTENDED_RELEASE_TABLET | Freq: Once | ORAL | Status: AC
  Administered 2015-11-12: 60 meq via ORAL
  Filled 2015-11-12: qty 3

## 2015-11-12 MED ORDER — POTASSIUM CHLORIDE 10 MEQ/100ML IV SOLN
10.0000 meq | Freq: Once | INTRAVENOUS | Status: AC
  Administered 2015-11-12: 10 meq via INTRAVENOUS
  Filled 2015-11-12: qty 100

## 2015-11-12 MED ORDER — OXYCODONE-ACETAMINOPHEN 5-325 MG PO TABS
1.0000 | ORAL_TABLET | Freq: Once | ORAL | Status: AC
  Administered 2015-11-12: 1 via ORAL
  Filled 2015-11-12: qty 1

## 2015-11-12 NOTE — ED Notes (Signed)
Critical K 2.7

## 2015-11-12 NOTE — ED Notes (Signed)
Pt from home c/o chest pain and heart palpitations. Pt had cardiac ablation 15 years ago and has hx of having adenosine to reduce HR. Pt reports that her HR was 160 prior to arrival but was able to bear down and reduce HR. Pt HR in triage 88. Pt is A&O, in NAD and is ambulatory w/o difficulty.

## 2015-11-13 ENCOUNTER — Inpatient Hospital Stay (HOSPITAL_COMMUNITY): Admit: 2015-11-13 | Payer: 59

## 2015-11-13 ENCOUNTER — Observation Stay (HOSPITAL_COMMUNITY): Payer: 59

## 2015-11-13 ENCOUNTER — Encounter (HOSPITAL_COMMUNITY): Payer: Self-pay | Admitting: Family Medicine

## 2015-11-13 DIAGNOSIS — E876 Hypokalemia: Secondary | ICD-10-CM | POA: Diagnosis not present

## 2015-11-13 DIAGNOSIS — R079 Chest pain, unspecified: Secondary | ICD-10-CM | POA: Diagnosis present

## 2015-11-13 DIAGNOSIS — I471 Supraventricular tachycardia: Secondary | ICD-10-CM | POA: Diagnosis not present

## 2015-11-13 DIAGNOSIS — R072 Precordial pain: Secondary | ICD-10-CM

## 2015-11-13 DIAGNOSIS — F909 Attention-deficit hyperactivity disorder, unspecified type: Secondary | ICD-10-CM | POA: Diagnosis present

## 2015-11-13 LAB — BASIC METABOLIC PANEL
Anion gap: 5 (ref 5–15)
BUN: 7 mg/dL (ref 6–20)
CO2: 19 mmol/L — ABNORMAL LOW (ref 22–32)
Calcium: 7.9 mg/dL — ABNORMAL LOW (ref 8.9–10.3)
Chloride: 112 mmol/L — ABNORMAL HIGH (ref 101–111)
Creatinine, Ser: 0.8 mg/dL (ref 0.44–1.00)
GFR calc Af Amer: >60 mL/min (ref >60)
GFR calc non Af Amer: >60 mL/min (ref >60)
Glucose, Bld: 90 mg/dL (ref 65–99)
Potassium: 3.3 mmol/L — ABNORMAL LOW (ref 3.5–5.1)
Sodium: 136 mmol/L (ref 135–145)

## 2015-11-13 LAB — CBC
HCT: 36.3 % (ref 36.0–46.0)
Hemoglobin: 12.2 g/dL (ref 12.0–15.0)
MCH: 31 pg (ref 26.0–34.0)
MCHC: 33.6 g/dL (ref 30.0–36.0)
MCV: 92.1 fL (ref 78.0–100.0)
Platelets: 241 10*3/uL (ref 150–400)
RBC: 3.94 MIL/uL (ref 3.87–5.11)
RDW: 12.4 % (ref 11.5–15.5)
WBC: 9.7 10*3/uL (ref 4.0–10.5)

## 2015-11-13 LAB — MAGNESIUM
Magnesium: 1.5 mg/dL — ABNORMAL LOW (ref 1.7–2.4)
Magnesium: 1.9 mg/dL (ref 1.7–2.4)

## 2015-11-13 LAB — URINALYSIS, ROUTINE W REFLEX MICROSCOPIC
Bilirubin Urine: NEGATIVE
Glucose, UA: NEGATIVE mg/dL
Hgb urine dipstick: NEGATIVE
Ketones, ur: NEGATIVE mg/dL
Leukocytes, UA: NEGATIVE
Nitrite: NEGATIVE
Protein, ur: NEGATIVE mg/dL
Specific Gravity, Urine: 1.004 — ABNORMAL LOW (ref 1.005–1.030)
pH: 7 (ref 5.0–8.0)

## 2015-11-13 LAB — PHOSPHORUS: Phosphorus: 2.7 mg/dL (ref 2.5–4.6)

## 2015-11-13 LAB — TROPONIN I
Troponin I: <0.03 ng/mL (ref <0.031)
Troponin I: <0.03 ng/mL (ref <0.031)
Troponin I: <0.03 ng/mL (ref <0.031)

## 2015-11-13 LAB — TSH: TSH: 0.795 u[IU]/mL (ref 0.350–4.500)

## 2015-11-13 LAB — D-DIMER, QUANTITATIVE: D-Dimer, Quant: 0.93 ug/mL-FEU — ABNORMAL HIGH (ref 0.00–0.50)

## 2015-11-13 MED ORDER — POTASSIUM CHLORIDE 20 MEQ/15ML (10%) PO SOLN
40.0000 meq | Freq: Once | ORAL | Status: AC
  Administered 2015-11-13: 40 meq via ORAL
  Filled 2015-11-13: qty 30

## 2015-11-13 MED ORDER — ALPRAZOLAM 1 MG PO TABS
1.0000 mg | ORAL_TABLET | Freq: Every day | ORAL | Status: DC | PRN
  Administered 2015-11-14: 1 mg via ORAL
  Filled 2015-11-13: qty 1

## 2015-11-13 MED ORDER — TOPIRAMATE 25 MG PO TABS
150.0000 mg | ORAL_TABLET | Freq: Every day | ORAL | Status: DC
  Administered 2015-11-13 – 2015-11-14 (×2): 150 mg via ORAL
  Filled 2015-11-13 (×2): qty 2

## 2015-11-13 MED ORDER — ENOXAPARIN SODIUM 40 MG/0.4ML ~~LOC~~ SOLN
40.0000 mg | SUBCUTANEOUS | Status: DC
  Administered 2015-11-14: 40 mg via SUBCUTANEOUS
  Filled 2015-11-13: qty 0.4

## 2015-11-13 MED ORDER — SODIUM CHLORIDE 0.9% FLUSH
3.0000 mL | Freq: Two times a day (BID) | INTRAVENOUS | Status: DC
  Administered 2015-11-13 – 2015-11-14 (×2): 3 mL via INTRAVENOUS

## 2015-11-13 MED ORDER — POTASSIUM CHLORIDE IN NACL 20-0.9 MEQ/L-% IV SOLN
INTRAVENOUS | Status: DC
  Administered 2015-11-13: 12:00:00 via INTRAVENOUS
  Filled 2015-11-13 (×2): qty 1000

## 2015-11-13 MED ORDER — PROPRANOLOL HCL 10 MG PO TABS
10.0000 mg | ORAL_TABLET | Freq: Two times a day (BID) | ORAL | Status: DC
  Administered 2015-11-13 – 2015-11-14 (×3): 10 mg via ORAL
  Filled 2015-11-13 (×4): qty 1

## 2015-11-13 MED ORDER — KETOROLAC TROMETHAMINE 15 MG/ML IJ SOLN
15.0000 mg | Freq: Once | INTRAMUSCULAR | Status: AC
  Administered 2015-11-13: 15 mg via INTRAVENOUS
  Filled 2015-11-13: qty 1

## 2015-11-13 MED ORDER — ENOXAPARIN SODIUM 60 MG/0.6ML ~~LOC~~ SOLN: 1.0000 mg/kg | Freq: Two times a day (BID) | SUBCUTANEOUS | Status: DC

## 2015-11-13 MED ORDER — ASPIRIN 81 MG PO CHEW
81.0000 mg | CHEWABLE_TABLET | Freq: Every day | ORAL | Status: DC
  Administered 2015-11-13 – 2015-11-14 (×2): 81 mg via ORAL
  Filled 2015-11-13 (×2): qty 1

## 2015-11-13 MED ORDER — FLUOXETINE HCL 20 MG PO CAPS
60.0000 mg | ORAL_CAPSULE | Freq: Every day | ORAL | Status: DC
  Administered 2015-11-13 – 2015-11-14 (×2): 60 mg via ORAL
  Filled 2015-11-13 (×2): qty 3

## 2015-11-13 MED ORDER — ENOXAPARIN SODIUM 40 MG/0.4ML ~~LOC~~ SOLN
40.0000 mg | SUBCUTANEOUS | Status: DC
  Administered 2015-11-13: 40 mg via SUBCUTANEOUS
  Filled 2015-11-13 (×2): qty 0.4

## 2015-11-13 MED ORDER — IOHEXOL 350 MG/ML SOLN
100.0000 mL | Freq: Once | INTRAVENOUS | Status: AC | PRN
  Administered 2015-11-13: 100 mL via INTRAVENOUS

## 2015-11-13 MED ORDER — INFLUENZA VAC SPLIT QUAD 0.5 ML IM SUSY
0.5000 mL | PREFILLED_SYRINGE | INTRAMUSCULAR | Status: DC
  Filled 2015-11-13 (×2): qty 0.5

## 2015-11-13 MED ORDER — PANTOPRAZOLE SODIUM 40 MG PO TBEC
40.0000 mg | DELAYED_RELEASE_TABLET | Freq: Every day | ORAL | Status: DC
  Administered 2015-11-13 – 2015-11-14 (×2): 40 mg via ORAL
  Filled 2015-11-13 (×2): qty 1

## 2015-11-13 MED ORDER — ONDANSETRON HCL 4 MG/2ML IJ SOLN
4.0000 mg | Freq: Four times a day (QID) | INTRAMUSCULAR | Status: DC | PRN
  Administered 2015-11-13: 4 mg via INTRAVENOUS
  Filled 2015-11-13: qty 2

## 2015-11-13 MED ORDER — MORPHINE SULFATE (PF) 2 MG/ML IV SOLN
2.0000 mg | INTRAVENOUS | Status: DC | PRN
  Administered 2015-11-13: 2 mg via INTRAVENOUS
  Filled 2015-11-13: qty 1

## 2015-11-13 MED ORDER — MAGNESIUM SULFATE 2 GM/50ML IV SOLN
2.0000 g | Freq: Once | INTRAVENOUS | Status: AC
  Administered 2015-11-13: 2 g via INTRAVENOUS
  Filled 2015-11-13: qty 50

## 2015-11-13 NOTE — H&P (Signed)
History and Physical  Patient Name: Emily Park     [id number]    DOB: 16-Mar-1971    DOA: 11/12/2015 Referring physician: Irena Cords, PA-C PCP: Garnet Koyanagi, DO      Chief Complaint: Chest pain and tachycardia  HPI: Emily Park is a 45 y.o. female with a past medical history significant for AVRT s/p ablation, migraines, ADD, and depression who presents with episode of tachycardia.   The patient notes decreased appetite, increased stress, fatigue, and generally "feeling drained" over the last several weeks. Then tonight before work she had an episode of heart fluttering, where she felt like her heartbeat was 200 beats a minute, followed by sharp left-sided chest pain that was constant.  In the ED, the pain resolved with morphine.  She was afebrile, hemodynamically stable, with an ECG that showed sinus tachycardia without ST changes.  Laboratory studies showed Na 139, K2.7, creatinine 1.1, WBC 8.3K, Hgb 14, negative troponin 3. Chest x-ray was clear.  Potassium supplementation was begun and TRH were asked to evaluate for admission. The case was discussed with cardiology on call who recommended electrolyte supplementation, observation overnight, and consultation is necessary in the morning, but otherwise outpatient follow-up with Electrophysiology.  The patient denies excessive alcohol use (1 glass of wine daily). She endorses increased stress recently. She has not had an episode of SVT since her ablation 10 years ago at [facility].   Review of Systems:  All other systems negative except as just noted or noted in the history of present illness.  Allergies  Allergen Reactions  . Amoxicillin Rash    Prior to Admission medications   Medication Sig Start Date End Date Taking? Authorizing Provider  ALPRAZolam Duanne Moron) 1 MG tablet Take 1 mg by mouth daily as needed for anxiety.   Yes Historical Provider, MD  amphetamine-dextroamphetamine (ADDERALL) 30 MG tablet Take 30 mg by mouth 2  (two) times daily as needed (focus).  11/03/15  Yes Historical Provider, MD  Biotin 2.5 MG CAPS Take 2.5 mg by mouth daily.   Yes Historical Provider, MD  cholecalciferol (VITAMIN D) 1000 units tablet Take 1,000 Units by mouth daily.   Yes Historical Provider, MD  ferrous sulfate 325 (65 FE) MG EC tablet Take 325 mg by mouth daily with breakfast.   Yes Historical Provider, MD  FLUoxetine (PROZAC) 20 MG capsule TAKE THREE   CAPSULES (60 MG TOTAL) BY MOUTH DAILY. 08/06/15  Yes Yvonne R Lowne, DO  HYDROcodone-ibuprofen (VICOPROFEN) 7.5-200 MG per tablet Take 1 tablet by mouth every 8 (eight) hours as needed for moderate pain. 09/11/14  Yes Brien Few, MD  Multiple Vitamins-Minerals (MULTIVITAMIN ADULT PO) Take 1 tablet by mouth daily.    Yes Historical Provider, MD  topiramate (TOPAMAX) 50 MG tablet Take 3 tablets (150 mg total) by mouth daily. Office visit due now 10/14/15  Yes Yvonne R Lowne, DO  modafinil (PROVIGIL) 200 MG tablet Take 1 tablet (200 mg total) by mouth daily. Patient not taking: Reported on 11/12/2015 03/18/15   Rosalita Chessman, DO  spironolactone (ALDACTONE) 25 MG tablet Take 1 tablet (25 mg total) by mouth daily. Patient not taking: Reported on 11/12/2015 03/09/15   Rosalita Chessman, DO    Past Medical History  Diagnosis Date  . Depression   . Migraine   . Arrhythmia     Past Surgical History  Procedure Laterality Date  . Breast lumpectomy      right breast  . Cardioversion    .  Ablation      Cardiac ablation  . Dilitation & currettage/hystroscopy with novasure ablation N/A 09/11/2014    Procedure: DILATATION & CURETTAGE/HYSTEROSCOPY WITH NOVASURE ABLATION;  Surgeon: Lovenia Kim, MD;  Location: [facility];  Service: Gynecology;  Laterality: N/A;  . Breast lumpectomy Right     Family history: family history includes Cancer in her mother; Depression in her mother; Heart attack in her father; Hypertension in her father; Hyperthyroidism in her sister; Hypothyroidism in her  mother and sister.  Social History: Patient lives with her husband.  She does not smoke. She uses Adderall and hydrocodone by prescription. She drinks in moderation.       Physical Exam: BP 148/98 mmHg  Pulse 86  Temp(Src) 97.4 F (36.3 C) (Oral)  Resp 19  SpO2 100% General appearance: Well-developed, adult female, alert and in no acute distress, anxious.   Eyes: Anicteric, conjunctiva pink, lids and lashes normal.     ENT: No nasal deformity, discharge, or epistaxis.  OP tacky dry without lesions.   Lymph: No cervical or supraclavicular lymphadenopathy. Skin: Warm and dry.  No jaundice.  No suspicious rashes or lesions. Cardiac: RRR, nl S1-S2, no murmurs appreciated.  Capillary refill is brisk.  JVP normal.  No LE edema.  Radial and DP pulses 2+ and symmetric. Respiratory: Normal respiratory rate and rhythm.  CTAB without rales or wheezes. Abdomen: Abdomen soft without rigidity.  No TTP. No ascites, distension.   MSK: No deformities or effusions. Neuro: Sensorium intact and responding to questions, attention normal.  Speech is fluent.  Moves all extremities equally and with normal coordination.    Psych: Behavior appropriate.  Affect anxious.  No evidence of aural or visual hallucinations or delusions.       Labs on Admission:  The metabolic panel shows hypokalemia. The complete blood count is normal. Troponin is negative.   Radiological Exams on Admission: Personally reviewed: Dg Chest 2 View  11/12/2015  CLINICAL DATA:  Chest pain.  History of cardiac arrhythmia EXAM: CHEST  2 VIEW COMPARISON:  None. FINDINGS: There is no edema or consolidation. The heart size and pulmonary vascularity are normal. No adenopathy. No bone lesions. No pneumothorax. IMPRESSION: No edema or consolidation. Electronically Signed   By: Lowella Grip III M.D.   On: 11/12/2015 18:59    EKG: Independently reviewed. Rate 97, sinus rhythm, QTC 457, poor baseline.    Assessment/Plan 1.  Hypokalemia:  -Supplemented 70 meQ in ED -Repeat in AM -Check magnesium and supplement as necessary   2. SVT:  Recurred 10 years after ablation, not documented on ECG.  The patient had an ablation 10 years ago. This was attempted in [place], but had to be completed in [facility] because it failed here. -Follow up with Electrophysiology as outpatient, preferably done with Dr. Lovena Le  3. Chest pain:  PERC negative.  Atypical for ischemia.   -Hold Adderall for tonight -Cycle troponins  4. Migraines:  -Continue home topiramate (has mild hypokalemic effect)  5. Depression:  -Continue home fluoxetine      DVT PPx: Lovenox Diet: Regular Consultants: None Code Status: FULL Family Communication: Husband at bedside  Medical decision making: What exists of the patient's previous chart was reviewed in depth and the case was discussed with Irena Cords, PA-C. Patient seen 2:35 AM on 11/13/2015.  Disposition Plan:  I recommend admission to telemetry observation status.  Clinical condition: stable.  Anticipate repeat K and TNI.  If both normal, discharge to home with Electrophysiology follow up.  Edwin Dada [facility] Pager [phone]

## 2015-11-13 NOTE — ED Provider Notes (Signed)
CSN: [id number]     Arrival date & time 11/12/15  1804 History   First MD Initiated Contact with Patient 11/12/15 1947     Chief Complaint  Patient presents with  . Palpitations  . Chest Pain  . Dizziness     (Consider location/radiation/quality/duration/timing/severity/associated sxs/prior Treatment) HPI Patient presents to the emergency department with chest discomfort and heart palpitations.  The patient states that she had an ablation done to a second AV nodal pathway 10 years ago and states that tonight she developed a heart rate in the 160s to 170s took a Xanax with relief.  The patient states that nothing seems make the condition worse.  Patient denies shortness of breath, nausea, vomiting, diaphoresis, headache, blurred vision, dysuria, incontinence, abdominal pain, diarrhea, near syncope or syncope.  Patient states that she is concerned about why she had this repeat episode of tachycardia and she did state that her cardiologist that this could recur Past Medical History  Diagnosis Date  . Depression   . Migraine   . Arrhythmia    Past Surgical History  Procedure Laterality Date  . Breast lumpectomy      right breast  . Cardioversion    . Ablation      Cardiac ablation  . Dilitation & currettage/hystroscopy with novasure ablation N/A 09/11/2014    Procedure: DILATATION & CURETTAGE/HYSTEROSCOPY WITH NOVASURE ABLATION;  Surgeon: Lovenia Kim, MD;  Location: [facility];  Service: Gynecology;  Laterality: N/A;  . Breast lumpectomy Right    Family History  Problem Relation Age of Onset  . Hypothyroidism Mother   . Depression Mother   . Cancer Mother     thyroid  . Hypertension Father   . Hypothyroidism Sister   . Hyperthyroidism Sister    Social History  Substance Use Topics  . Smoking status: Never Smoker   . Smokeless tobacco: None  . Alcohol Use: Yes     Comment: Wine---once or twice a month   OB History    No data available     Review of Systems  All other  systems negative except as documented in the HPI. All pertinent positives and negatives as reviewed in the HPI.   Allergies  Amoxicillin  Home Medications   Prior to Admission medications   Medication Sig Start Date End Date Taking? Authorizing Provider  ALPRAZolam Duanne Moron) 1 MG tablet Take 1 mg by mouth daily as needed for anxiety.   Yes Historical Provider, MD  amphetamine-dextroamphetamine (ADDERALL) 30 MG tablet Take 30 mg by mouth 2 (two) times daily as needed (focus).  11/03/15  Yes Historical Provider, MD  Biotin 2.5 MG CAPS Take 2.5 mg by mouth daily.   Yes Historical Provider, MD  cholecalciferol (VITAMIN D) 1000 units tablet Take 1,000 Units by mouth daily.   Yes Historical Provider, MD  ferrous sulfate 325 (65 FE) MG EC tablet Take 325 mg by mouth daily with breakfast.   Yes Historical Provider, MD  FLUoxetine (PROZAC) 20 MG capsule TAKE THREE   CAPSULES (60 MG TOTAL) BY MOUTH DAILY. 08/06/15  Yes Yvonne R Lowne, DO  HYDROcodone-ibuprofen (VICOPROFEN) 7.5-200 MG per tablet Take 1 tablet by mouth every 8 (eight) hours as needed for moderate pain. 09/11/14  Yes Brien Few, MD  Multiple Vitamins-Minerals (MULTIVITAMIN ADULT PO) Take 1 tablet by mouth daily.    Yes Historical Provider, MD  topiramate (TOPAMAX) 50 MG tablet Take 3 tablets (150 mg total) by mouth daily. Office visit due now 10/14/15  Yes Alferd Apa  Lowne, DO  modafinil (PROVIGIL) 200 MG tablet Take 1 tablet (200 mg total) by mouth daily. Patient not taking: Reported on 11/12/2015 03/18/15   Rosalita Chessman, DO  spironolactone (ALDACTONE) 25 MG tablet Take 1 tablet (25 mg total) by mouth daily. Patient not taking: Reported on 11/12/2015 03/09/15   Alferd Apa Lowne, DO   BP 148/98 mmHg  Pulse 86  Temp(Src) 97.4 F (36.3 C) (Oral)  Resp 19  SpO2 100% Physical Exam  Constitutional: She is oriented to person, place, and time. She appears well-developed and well-nourished. No distress.  HENT:  Head: Normocephalic and atraumatic.   Mouth/Throat: Oropharynx is clear and moist.  Eyes: Pupils are equal, round, and reactive to light.  Neck: Normal range of motion. Neck supple.  Cardiovascular: Normal rate, regular rhythm and normal heart sounds.  Exam reveals no gallop and no friction rub.   No murmur heard. Pulmonary/Chest: Effort normal and breath sounds normal. No respiratory distress. She has no wheezes.  Abdominal: Soft. Bowel sounds are normal. She exhibits no distension. There is no tenderness.  Neurological: She is alert and oriented to person, place, and time. She exhibits normal muscle tone. Coordination normal.  Skin: Skin is warm and dry. No rash noted. No erythema.  Psychiatric: Her behavior is normal. Her mood appears anxious.  Nursing note and vitals reviewed.   ED Course  Procedures (including critical care time) Labs Review Labs Reviewed  BASIC METABOLIC PANEL - Abnormal; Notable for the following:    Potassium 2.7 (*)    Creatinine, Ser 1.11 (*)    GFR calc non Af Amer 59 (*)    All other components within normal limits  CBC  TROPONIN I  TROPONIN I  Randolm Idol, ED    Imaging Review Dg Chest 2 View  11/12/2015  CLINICAL DATA:  Chest pain.  History of cardiac arrhythmia EXAM: CHEST  2 VIEW COMPARISON:  None. FINDINGS: There is no edema or consolidation. The heart size and pulmonary vascularity are normal. No adenopathy. No bone lesions. No pneumothorax. IMPRESSION: No edema or consolidation. Electronically Signed   By: Lowella Grip III M.D.   On: 11/12/2015 18:59   I have personally reviewed and evaluated these images and lab results as part of my medical decision-making.   EKG Interpretation   Date/Time:  Friday November 12 2015 18:32:54 EDT Ventricular Rate:  97 PR Interval:  126 QRS Duration: 92 QT Interval:  360 QTC Calculation: 457 R Axis:   70 Text Interpretation:  Sinus rhythm Nonspecific T abnormalities, diffuse  leads , more prominent than last tracing Confirmed by  KNAPP  MD-J, JON  [id number]) on 11/12/2015 6:36:06 PM      After lengthy discussion with the patient.  I will speak with the [facility] about admission for her.  The patient is concerned about having a repeat episode of this tachycardia and with the combination of the low potassium.  We will observe her in the hospital overnight  Dalia Heading, PA-C 11/13/15 [place], MD 11/13/15 1350

## 2015-11-13 NOTE — Consult Note (Signed)
Primary cardiologist: Dr Lovena Le (would prefer to see different electrophysiologist)  HPI: 45 year old female with past medical history of SVT ablation for evaluation of chest pain and palpitations. Patient has a history of SVT ablation at [facility] in 2002. She typically does not have dyspnea on exertion, orthopnea, PND, pedal edema, syncope or exertional chest pain. She awoke at 4 PM yesterday with chest pain and palpitations. Her chest pain was in the left breast area without radiation. It was described as a sharp pain increasing with inspiration. No associated nausea, diaphoresis or dyspnea. She also noticed that her heart rate was 160. She presented to the emergency room and her heart rate improvedto 110 at that time. Her chest pain continued and was improved with morphine.  Medications Prior to Admission  Medication Sig Dispense Refill  . ALPRAZolam (XANAX) 1 MG tablet Take 1 mg by mouth daily as needed for anxiety.    Marland Kitchen amphetamine-dextroamphetamine (ADDERALL) 30 MG tablet Take 30 mg by mouth 2 (two) times daily as needed (focus).     . Biotin 2.5 MG CAPS Take 2.5 mg by mouth daily.    . cholecalciferol (VITAMIN D) 1000 units tablet Take 1,000 Units by mouth daily.    . ferrous sulfate 325 (65 FE) MG EC tablet Take 325 mg by mouth daily with breakfast.    . FLUoxetine (PROZAC) 20 MG capsule TAKE THREE   CAPSULES (60 MG TOTAL) BY MOUTH DAILY. 90 capsule 0  . HYDROcodone-ibuprofen (VICOPROFEN) 7.5-200 MG per tablet Take 1 tablet by mouth every 8 (eight) hours as needed for moderate pain. 30 tablet 0  . Multiple Vitamins-Minerals (MULTIVITAMIN ADULT PO) Take 1 tablet by mouth daily.     Marland Kitchen topiramate (TOPAMAX) 50 MG tablet Take 3 tablets (150 mg total) by mouth daily. Office visit due now 90 tablet 1  . modafinil (PROVIGIL) 200 MG tablet Take 1 tablet (200 mg total) by mouth daily. (Patient not taking: Reported on 11/12/2015) 30 tablet 2  . spironolactone (ALDACTONE) 25 MG tablet Take 1  tablet (25 mg total) by mouth daily. (Patient not taking: Reported on 11/12/2015) 90 tablet 3    Allergies  Allergen Reactions  . Amoxicillin Rash  . Bactrim [Sulfamethoxazole-Trimethoprim] Rash    Rash noted to chest and back     Past Medical History  Diagnosis Date  . Depression   . Migraine   . Arrhythmia   . SVT (supraventricular tachycardia) ([place])   . Hyperlipidemia     Past Surgical History  Procedure Laterality Date  . Breast lumpectomy      right breast  . Cardioversion    . Ablation      Cardiac ablation, endometrial ablation  . Dilitation & currettage/hystroscopy with novasure ablation N/A 09/11/2014    Procedure: DILATATION & CURETTAGE/HYSTEROSCOPY WITH NOVASURE ABLATION;  Surgeon: Lovenia Kim, MD;  Location: [facility];  Service: Gynecology;  Laterality: N/A;  . Breast lumpectomy Right     Social History   Social History  . Marital Status: Married    Spouse Name: N/A  . Number of Children: 2  . Years of Education: N/A   Occupational History  . 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 Smoking status: Never Smoker   . Smokeless tobacco: Not on file  . Alcohol Use: 1.8 oz/week    3 Glasses of wine per week     Comment: 3-4 glasses wine per week  . Drug Use: Yes  . Sexual  Activity:    Partners: Male     Comment: Married   Other Topics Concern  . Not on file   Social History Narrative   Exercise-- no    Family History  Problem Relation Age of Onset  . Hypothyroidism Mother   . Depression Mother   . Cancer Mother     thyroid  . Hypertension Father   . Heart attack Father     Age 23  . AAA (abdominal aortic aneurysm) Father   . Hypothyroidism Sister   . Hyperthyroidism Sister     ROS:  no fevers or chills, productive cough, hemoptysis, dysphasia, odynophagia, melena, hematochezia, dysuria, hematuria, rash, seizure activity, orthopnea, PND, pedal edema, claudication. Remaining systems are negative.  Physical Exam:   Blood  pressure 127/74, pulse 87, temperature 98.7 F (37.1 C), temperature source Oral, resp. rate 20, height '5\' 5"'  (1.651 m), weight 126 lb 8 oz (57.38 kg), SpO2 100 %.  General:  Well developed/well nourished in NAD Skin warm/dry Patient not depressed No peripheral clubbing Back-normal HEENT-normal/normal eyelids Neck supple/normal carotid upstroke bilaterally; no bruits; no JVD; no thyromegaly chest - CTA/ normal expansion CV - RRR/normal S1 and S2; no murmurs, rubs or gallops;  PMI nondisplaced Abdomen -NT/ND, no HSM, no mass, + bowel sounds, no bruit 2+ femoral pulses, no bruits Ext-no edema, chords, 2+ DP Neuro-grossly nonfocal  ECG  Sinus rhythm, prolonged QT interval, nonspecific ST changes.  Results for orders placed or performed during the hospital encounter of 11/12/15 (from the past 48 hour(s))  Basic metabolic panel     Status: Abnormal   Collection Time: 11/12/15  7:01 PM  Result Value Ref Range   Sodium 139 135 - 145 mmol/L   Potassium 2.7 (LL) 3.5 - 5.1 mmol/L    Comment: CRITICAL RESULT CALLED TO, READ BACK BY AND VERIFIED WITH: AUTUMN DENNIS,RN [id number] @ 1938 BY J SCOTTON    Chloride 109 101 - 111 mmol/L   CO2 23 22 - 32 mmol/L   Glucose, Bld 78 65 - 99 mg/dL   BUN 12 6 - 20 mg/dL   Creatinine, Ser 1.11 (H) 0.44 - 1.00 mg/dL   Calcium 9.2 8.9 - 10.3 mg/dL   GFR calc non Af Amer 59 (L) >60 mL/min   GFR calc Af Amer >60 >60 mL/min    Comment: (NOTE) The eGFR has been calculated using the CKD EPI equation. This calculation has not been validated in all clinical situations. eGFR's persistently <60 mL/min signify possible Chronic Kidney Disease.    Anion gap 7 5 - 15  CBC     Status: None   Collection Time: 11/12/15  7:01 PM  Result Value Ref Range   WBC 8.2 4.0 - 10.5 K/uL   RBC 4.45 3.87 - 5.11 MIL/uL   Hemoglobin 14.5 12.0 - 15.0 g/dL   HCT 41.5 36.0 - 46.0 %   MCV 93.3 78.0 - 100.0 fL   MCH 32.6 26.0 - 34.0 pg   MCHC 34.9 30.0 - 36.0 g/dL   RDW 12.7 11.5  - 15.5 %   Platelets 276 150 - 400 K/uL  Troponin I     Status: None   Collection Time: 11/12/15  7:01 PM  Result Value Ref Range   Troponin I <0.03 <0.031 ng/mL    Comment:        NO INDICATION OF MYOCARDIAL INJURY.   I-Stat Troponin, ED (not at [facility])     Status: None   Collection Time: 11/12/15 11:03 PM  Result Value Ref Range   Troponin i, poc 0.00 0.00 - 0.08 ng/mL   Comment 3            Comment: Due to the release kinetics of cTnI, a negative result within the first hours of the onset of symptoms does not rule out myocardial infarction with certainty. If myocardial infarction is still suspected, repeat the test at appropriate intervals.   Troponin I     Status: None   Collection Time: 11/12/15 11:40 PM  Result Value Ref Range   Troponin I <0.03 <0.031 ng/mL    Comment:        NO INDICATION OF MYOCARDIAL INJURY.   Magnesium     Status: Abnormal   Collection Time: 11/12/15 11:40 PM  Result Value Ref Range   Magnesium 1.5 (L) 1.7 - 2.4 mg/dL  CBC     Status: None   Collection Time: 11/13/15  5:37 AM  Result Value Ref Range   WBC 9.7 4.0 - 10.5 K/uL   RBC 3.94 3.87 - 5.11 MIL/uL   Hemoglobin 12.2 12.0 - 15.0 g/dL   HCT 36.3 36.0 - 46.0 %   MCV 92.1 78.0 - 100.0 fL   MCH 31.0 26.0 - 34.0 pg   MCHC 33.6 30.0 - 36.0 g/dL   RDW 12.4 11.5 - 15.5 %   Platelets 241 150 - 400 K/uL  Basic metabolic panel     Status: Abnormal   Collection Time: 11/13/15  5:37 AM  Result Value Ref Range   Sodium 136 135 - 145 mmol/L   Potassium 3.3 (L) 3.5 - 5.1 mmol/L    Comment: DELTA CHECK NOTED REPEATED TO VERIFY    Chloride 112 (H) 101 - 111 mmol/L   CO2 19 (L) 22 - 32 mmol/L   Glucose, Bld 90 65 - 99 mg/dL   BUN 7 6 - 20 mg/dL   Creatinine, Ser 0.80 0.44 - 1.00 mg/dL   Calcium 7.9 (L) 8.9 - 10.3 mg/dL   GFR calc non Af Amer >60 >60 mL/min   GFR calc Af Amer >60 >60 mL/min    Comment: (NOTE) The eGFR has been calculated using the CKD EPI equation. This calculation has not  been validated in all clinical situations. eGFR's persistently <60 mL/min signify possible Chronic Kidney Disease.    Anion gap 5 5 - 15  Troponin I     Status: None   Collection Time: 11/13/15  5:37 AM  Result Value Ref Range   Troponin I <0.03 <0.031 ng/mL    Comment:        NO INDICATION OF MYOCARDIAL INJURY.   Troponin I     Status: None   Collection Time: 11/13/15 11:40 AM  Result Value Ref Range   Troponin I <0.03 <0.031 ng/mL    Comment:        NO INDICATION OF MYOCARDIAL INJURY.   Magnesium     Status: None   Collection Time: 11/13/15 11:40 AM  Result Value Ref Range   Magnesium 1.9 1.7 - 2.4 mg/dL  Phosphorus     Status: None   Collection Time: 11/13/15 11:40 AM  Result Value Ref Range   Phosphorus 2.7 2.5 - 4.6 mg/dL  TSH     Status: None   Collection Time: 11/13/15 11:40 AM  Result Value Ref Range   TSH 0.795 0.350 - 4.500 uIU/mL  D-dimer, quantitative (not at [facility])     Status: Abnormal   Collection Time: 11/13/15 11:40 AM  Result  Value Ref Range   D-Dimer, Quant 0.93 (H) 0.00 - 0.50 ug/mL-FEU    Comment: (NOTE) At the manufacturer cut-off of 0.50 ug/mL FEU, this assay has been documented to exclude PE with a sensitivity and negative predictive value of 97 to 99%.  At this time, this assay has not been approved by the FDA to exclude DVT/VTE. Results should be correlated with clinical presentation.   Urinalysis, Routine w reflex microscopic (not at [facility])     Status: Abnormal   Collection Time: 11/13/15  1:49 PM  Result Value Ref Range   Color, Urine YELLOW YELLOW   APPearance CLOUDY (A) CLEAR   Specific Gravity, Urine 1.004 (L) 1.005 - 1.030   pH 7.0 5.0 - 8.0   Glucose, UA NEGATIVE NEGATIVE mg/dL   Hgb urine dipstick NEGATIVE NEGATIVE   Bilirubin Urine NEGATIVE NEGATIVE   Ketones, ur NEGATIVE NEGATIVE mg/dL   Protein, ur NEGATIVE NEGATIVE mg/dL   Nitrite NEGATIVE NEGATIVE   Leukocytes, UA NEGATIVE NEGATIVE    Comment: MICROSCOPIC NOT DONE ON URINES  WITH NEGATIVE PROTEIN, BLOOD, LEUKOCYTES, NITRITE, OR GLUCOSE <1000 mg/dL.    Dg Chest 2 View  11/12/2015  CLINICAL DATA:  Chest pain.  History of cardiac arrhythmia EXAM: CHEST  2 VIEW COMPARISON:  None. FINDINGS: There is no edema or consolidation. The heart size and pulmonary vascularity are normal. No adenopathy. No bone lesions. No pneumothorax. IMPRESSION: No edema or consolidation. Electronically Signed   By: Lowella Grip III M.D.   On: 11/12/2015 18:59    Assessment/Plan 1 chest pain-symptoms atypical. Enzymes negative.  Can arrange outpatient stress echocardiogram for further evaluation. Note d-dimer is elevated. Would check CTA to exclude pulmonary embolism although no risk factors. Will leave to primary care. 2 palpitations-symptoms at onset similar to previous SVT but she states her heart rate was elevated at time of arrival and she was in sinus rhythm. Would arrange outpatient event monitor. Agree with beta blocker. Check echocardiogram for LV function. 3 prolonged QT interval-likely secondary to electrolyte abnormalities on admission. Potassium 2.7 and magnesium 1.5. Agree with supplementation. Etiology of hypokalemia and hypomagnesemia unclear. No recent diarrhea or decreased by mouth intake.Would repeat echocardiogram tomorrow morning once electrolytes replaced. She will need close follow-up of potassium and magnesium following discharge.  Kirk Ruths MD 11/13/2015, 3:48 PM

## 2015-11-13 NOTE — Progress Notes (Signed)
I've seen and examined Ms. Romano at bedside in the presence of family members including her parents and her husband at bedside, and reviewed her chart. Patient was admitted by Dr. Loleta Books this morning. Please refer to his comprehensive H&P for details. 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 The question is whether she has a recurrence of PSVT, and whether this is precipitated by electrolytes being off or caused by other possibilities. She still has chest pain. Her family says she has been under a lot of stress in the last week. We'll therefore obtain UA/urine culture/TSH/D-dimer/2-D echocardiogram, give IV fluids/PPI/NSAID, continue to replenish electrolytes, start low-dose beta blocker, give aspirin and consult cardiology. Dr. Debara Pickett will graciously see patient in consult. Appreciate help.

## 2015-11-14 ENCOUNTER — Observation Stay (HOSPITAL_BASED_OUTPATIENT_CLINIC_OR_DEPARTMENT_OTHER): Payer: 59

## 2015-11-14 DIAGNOSIS — E876 Hypokalemia: Secondary | ICD-10-CM

## 2015-11-14 DIAGNOSIS — G43909 Migraine, unspecified, not intractable, without status migrainosus: Secondary | ICD-10-CM | POA: Diagnosis not present

## 2015-11-14 DIAGNOSIS — R079 Chest pain, unspecified: Secondary | ICD-10-CM

## 2015-11-14 DIAGNOSIS — R0789 Other chest pain: Secondary | ICD-10-CM | POA: Diagnosis not present

## 2015-11-14 DIAGNOSIS — I471 Supraventricular tachycardia: Secondary | ICD-10-CM | POA: Diagnosis not present

## 2015-11-14 DIAGNOSIS — R918 Other nonspecific abnormal finding of lung field: Secondary | ICD-10-CM | POA: Diagnosis present

## 2015-11-14 DIAGNOSIS — R072 Precordial pain: Secondary | ICD-10-CM | POA: Diagnosis not present

## 2015-11-14 LAB — ECHOCARDIOGRAM COMPLETE
Height: 65 in
Weight: 2024 oz

## 2015-11-14 LAB — CBC WITH DIFFERENTIAL/PLATELET
Basophils Absolute: 0 10*3/uL (ref 0.0–0.1)
Basophils Relative: 0 %
Eosinophils Absolute: 0.2 10*3/uL (ref 0.0–0.7)
Eosinophils Relative: 2 %
HCT: 43.5 % (ref 36.0–46.0)
Hemoglobin: 14.6 g/dL (ref 12.0–15.0)
Lymphocytes Relative: 24 %
Lymphs Abs: 2.6 10*3/uL (ref 0.7–4.0)
MCH: 32.2 pg (ref 26.0–34.0)
MCHC: 33.6 g/dL (ref 30.0–36.0)
MCV: 95.8 fL (ref 78.0–100.0)
Monocytes Absolute: 0.8 10*3/uL (ref 0.1–1.0)
Monocytes Relative: 7 %
Neutro Abs: 7 10*3/uL (ref 1.7–7.7)
Neutrophils Relative %: 67 %
Platelets: 327 10*3/uL (ref 150–400)
RBC: 4.54 MIL/uL (ref 3.87–5.11)
RDW: 12.6 % (ref 11.5–15.5)
WBC: 10.6 10*3/uL — ABNORMAL HIGH (ref 4.0–10.5)

## 2015-11-14 LAB — COMPREHENSIVE METABOLIC PANEL
ALT: 13 U/L — ABNORMAL LOW (ref 14–54)
AST: 17 U/L (ref 15–41)
Albumin: 4.1 g/dL (ref 3.5–5.0)
Alkaline Phosphatase: 53 U/L (ref 38–126)
Anion gap: 9 (ref 5–15)
BUN: <5 mg/dL — ABNORMAL LOW (ref 6–20)
CO2: 22 mmol/L (ref 22–32)
Calcium: 9.1 mg/dL (ref 8.9–10.3)
Chloride: 107 mmol/L (ref 101–111)
Creatinine, Ser: 0.93 mg/dL (ref 0.44–1.00)
GFR calc Af Amer: >60 mL/min (ref >60)
GFR calc non Af Amer: >60 mL/min (ref >60)
Glucose, Bld: 109 mg/dL — ABNORMAL HIGH (ref 65–99)
Potassium: 3.6 mmol/L (ref 3.5–5.1)
Sodium: 138 mmol/L (ref 135–145)
Total Bilirubin: 0.5 mg/dL (ref 0.3–1.2)
Total Protein: 7.4 g/dL (ref 6.5–8.1)

## 2015-11-14 MED ORDER — PROPRANOLOL HCL 10 MG PO TABS: 10.0000 mg | ORAL_TABLET | Freq: Two times a day (BID) | ORAL | Status: DC

## 2015-11-14 NOTE — Progress Notes (Signed)
Echocardiogram 2D Echocardiogram has been performed.  Emily Park 11/14/2015, 10:38 AM

## 2015-11-14 NOTE — Plan of Care (Signed)
Problem: Education: Goal: Knowledge of [place] [facility] Education information/materials will improve Outcome: Progressing .  Problem: Safety: Goal: Ability to remain free from injury will improve Outcome: Completed/Met Date Met:  11/14/15 Independent with steady gait.    Problem: Health Behavior/Discharge Planning: Goal: Ability to manage health-related needs will improve Outcome: Progressing .  Problem: Pain Managment: Goal: General experience of comfort will improve Outcome: Progressing Denies CP at this time, denies nausea.    Problem: Physical Regulation: Goal: Ability to maintain clinical measurements within normal limits will improve Outcome: Progressing . Goal: Will remain free from infection Outcome: Progressing .  Problem: Skin Integrity: Goal: Risk for impaired skin integrity will decrease Outcome: Completed/Met Date Met:  11/14/15 .  Problem: Activity: Goal: Risk for activity intolerance will decrease Outcome: Not Applicable Date Met:  74/73/40 .  Problem: Nutrition: Goal: Adequate nutrition will be maintained Outcome: Completed/Met Date Met:  11/14/15 .

## 2015-11-14 NOTE — Progress Notes (Signed)
Patient discharged @ 1745 in stable condition.  Ambulatory.  Educated pt regarding diagnosis, symptoms to call    MD about.  Medication prescription ready at pt local pharmacy.  Discharge instructions given.  Teach back completed.

## 2015-11-14 NOTE — Progress Notes (Signed)
SUBJECTIVE:  No further chest pain.     PHYSICAL EXAM Filed Vitals:   11/13/15 1340 11/13/15 2019 11/14/15 0444 11/14/15 1000  BP: 127/74 145/97 131/88 113/83  Pulse: 87 97 93 102  Temp: 98.7 F (37.1 C) 98.2 F (36.8 C) 98 F (36.7 C)   TempSrc: Oral Oral Oral   Resp: 20 20 20    Height:      Weight:      SpO2: 100% 100% 100%    General:  No distress Lungs:  Clear Heart:  RRR Abdomen:  Positive bowel sounds, no rebound no guarding Extremities:  No edema   LABS: Lab Results  Component Value Date   TROPONINI <0.03 11/13/2015   Results for orders placed or performed during the hospital encounter of 11/12/15 (from the past 24 hour(s))  Troponin I     Status: None   Collection Time: 11/13/15 11:40 AM  Result Value Ref Range   Troponin I <0.03 <0.031 ng/mL  Magnesium     Status: None   Collection Time: 11/13/15 11:40 AM  Result Value Ref Range   Magnesium 1.9 1.7 - 2.4 mg/dL  Phosphorus     Status: None   Collection Time: 11/13/15 11:40 AM  Result Value Ref Range   Phosphorus 2.7 2.5 - 4.6 mg/dL  TSH     Status: None   Collection Time: 11/13/15 11:40 AM  Result Value Ref Range   TSH 0.795 0.350 - 4.500 uIU/mL  D-dimer, quantitative (not at [facility])     Status: Abnormal   Collection Time: 11/13/15 11:40 AM  Result Value Ref Range   D-Dimer, Quant 0.93 (H) 0.00 - 0.50 ug/mL-FEU  Urinalysis, Routine w reflex microscopic (not at [facility])     Status: Abnormal   Collection Time: 11/13/15  1:49 PM  Result Value Ref Range   Color, Urine YELLOW YELLOW   APPearance CLOUDY (A) CLEAR   Specific Gravity, Urine 1.004 (L) 1.005 - 1.030   pH 7.0 5.0 - 8.0   Glucose, UA NEGATIVE NEGATIVE mg/dL   Hgb urine dipstick NEGATIVE NEGATIVE   Bilirubin Urine NEGATIVE NEGATIVE   Ketones, ur NEGATIVE NEGATIVE mg/dL   Protein, ur NEGATIVE NEGATIVE mg/dL   Nitrite NEGATIVE NEGATIVE   Leukocytes, UA NEGATIVE NEGATIVE  CBC with Differential/Platelet     Status: Abnormal   Collection Time:  11/14/15  5:33 AM  Result Value Ref Range   WBC 10.6 (H) 4.0 - 10.5 K/uL   RBC 4.54 3.87 - 5.11 MIL/uL   Hemoglobin 14.6 12.0 - 15.0 g/dL   HCT 43.5 36.0 - 46.0 %   MCV 95.8 78.0 - 100.0 fL   MCH 32.2 26.0 - 34.0 pg   MCHC 33.6 30.0 - 36.0 g/dL   RDW 12.6 11.5 - 15.5 %   Platelets 327 150 - 400 K/uL   Neutrophils Relative % 67 %   Neutro Abs 7.0 1.7 - 7.7 K/uL   Lymphocytes Relative 24 %   Lymphs Abs 2.6 0.7 - 4.0 K/uL   Monocytes Relative 7 %   Monocytes Absolute 0.8 0.1 - 1.0 K/uL   Eosinophils Relative 2 %   Eosinophils Absolute 0.2 0.0 - 0.7 K/uL   Basophils Relative 0 %   Basophils Absolute 0.0 0.0 - 0.1 K/uL  Comprehensive metabolic panel     Status: Abnormal   Collection Time: 11/14/15  5:33 AM  Result Value Ref Range   Sodium 138 135 - 145 mmol/L   Potassium 3.6 3.5 - 5.1  mmol/L   Chloride 107 101 - 111 mmol/L   CO2 22 22 - 32 mmol/L   Glucose, Bld 109 (H) 65 - 99 mg/dL   BUN <5 (L) 6 - 20 mg/dL   Creatinine, Ser 0.93 0.44 - 1.00 mg/dL   Calcium 9.1 8.9 - 10.3 mg/dL   Total Protein 7.4 6.5 - 8.1 g/dL   Albumin 4.1 3.5 - 5.0 g/dL   AST 17 15 - 41 U/L   ALT 13 (L) 14 - 54 U/L   Alkaline Phosphatase 53 38 - 126 U/L   Total Bilirubin 0.5 0.3 - 1.2 mg/dL   GFR calc non Af Amer >60 >60 mL/min   GFR calc Af Amer >60 >60 mL/min   Anion gap 9 5 - 15    Intake/Output Summary (Last 24 hours) at 11/14/15 1102 Last data filed at 11/14/15 0500  Gross per 24 hour  Intake    900 ml  Output      0 ml  Net    900 ml    EKG:  NSR, nonspecific ST T wave changes QTc 454.  11/14/2015  ASSESSMENT AND PLAN:  CHEST PAIN:  Plan out patient POET (Plain Old Exercise Treadmill)  PROLONGED QT:  QTc is very slightly prolonged.  Can be followed as an outpatient.    SVT:   No futher episodes.    Jeneen Rinks [facility] 11/14/2015 11:02 AM

## 2015-11-14 NOTE — Discharge Summary (Addendum)
Emily Park, is a 45 y.o. female  DOB July 21, 1971  MRN [id number].  Admission date:  11/12/2015  Admitting Physician  Alberteen Sam, MD  Discharge Date:  11/14/2015   Primary MD  Loreen Freud, DO  Recommendations for primary care physician for things to follow:  Follow Qtc/heart rate, ?refer to EP cardiology, Follow lung nodules in 6-9 months  Admission Diagnosis   Hypokalemia [E87.6] Heart palpitations [R00.2]   Discharge Diagnosis  Hypokalemia [E87.6] Heart palpitations [R00.2]   Principal Problem:   Hypokalemia Active Problems:   Paroxysmal SVT (supraventricular tachycardia) (HCC)   Hypomagnesemia   Attention deficit hyperactivity disorder (ADHD)   Chest pain   Lung nodules      Hospital Course  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 Patient had 2-D echocardiogram which was essentially unrevealing, also CTA chest/lower extremity venous Doppler prompted by elevated d-dimer negative for thromboembolic disease, however CTA chest suggested lung nodules "1. No pulmonary emboli or acute abnormality. 2. Scattered pulmonary nodules as described above. The largest measures 6 x 4.5 mm along the left major fissure. See below for follow-up recommendations. Non-contrast chest CT at 6-12 months is recommended. If the nodule is stable at time of repeat CT, then future CT at 18-24 months (from today's scan) is considered optional for low-risk patients, but is  recommended for high-risk patients. This recommendation follows the consensus statement: Guidelines for Management of Incidental Pulmonary Nodules Detected on CT Images:From the Fleischner Society 2017; published online before print (10.1148/[id number])". White count was slightly elevated prior to discharge but the significance of this is not clear-? Acute bronchitis. In any case, patient also had serial cardiac enzymes which are negative, TSH normal and she was seen by cardiology who recommended resuming beta blocker/replenishing electrolytes and to have patient followed by cardiology outpatient. Patient feels better and therefore be discharged home to follow with her PCP and cardiology. She will need follow-up of pulmonary nodules. The significance of the leukocytosis is not clear at this point and will need to be followed outpatient.  Discharge Condition Stable.  Consults obtained  Cardiology  Follow UP  PCP Cardiology   Discharge Instructions  and  Discharge Medications      Discharge Instructions    Diet - low sodium heart healthy    Complete by:  As directed      Discharge instructions    Complete by:  As directed   Please follow with cardiology in 1-2 weeks     Increase activity slowly    Complete by:  As directed             Medication List    STOP taking these medications        modafinil 200  MG tablet  Commonly known as:  PROVIGIL      TAKE these medications        ALPRAZolam 1 MG tablet  Commonly known as:  XANAX  Take 1 mg by mouth daily as needed for anxiety.     amphetamine-dextroamphetamine 30 MG tablet  Commonly known as:  ADDERALL  Take 30 mg by mouth 2 (two) times daily as needed (focus).     Biotin 2.5 MG Caps  Take 2.5 mg by mouth daily.     cholecalciferol 1000 units tablet  Commonly known as:  VITAMIN D  Take 1,000 Units by mouth daily.     ferrous sulfate 325 (65 FE) MG EC tablet  Take 325 mg by mouth daily with breakfast.      FLUoxetine 20 MG capsule  Commonly known as:  PROZAC  TAKE THREE   CAPSULES (60 MG TOTAL) BY MOUTH DAILY.     HYDROcodone-ibuprofen 7.5-200 MG tablet  Commonly known as:  VICOPROFEN  Take 1 tablet by mouth every 8 (eight) hours as needed for moderate pain.     MULTIVITAMIN ADULT PO  Take 1 tablet by mouth daily.     propranolol 10 MG tablet  Commonly known as:  INDERAL  Take 1 tablet (10 mg total) by mouth 2 (two) times daily.     spironolactone 25 MG tablet  Commonly known as:  ALDACTONE  Take 1 tablet (25 mg total) by mouth daily.     topiramate 50 MG tablet  Commonly known as:  TOPAMAX  Take 3 tablets (150 mg total) by mouth daily. Office visit due now        Diet and Activity recommendation: See Discharge Instructions above  Major procedures and Radiology Reports - PLEASE review detailed and final reports for all details, in brief -    Dg Chest 2 View  11/12/2015  CLINICAL DATA:  Chest pain.  History of cardiac arrhythmia EXAM: CHEST  2 VIEW COMPARISON:  None. FINDINGS: There is no edema or consolidation. The heart size and pulmonary vascularity are normal. No adenopathy. No bone lesions. No pneumothorax. IMPRESSION: No edema or consolidation. Electronically Signed   By: Bretta Bang III M.D.   On: 11/12/2015 18:59   Ct Angio Chest Pe W/cm &/or Wo Cm  11/13/2015  CLINICAL DATA:  Shortness of breath.  Rule out pulmonary embolus. EXAM: CT ANGIOGRAPHY CHEST WITH CONTRAST TECHNIQUE: Multidetector CT imaging of the chest was performed using the standard protocol during bolus administration of intravenous contrast. Multiplanar CT image reconstructions and MIPs were obtained to evaluate the vascular anatomy. CONTRAST:  OMNIPAQUE IOHEXOL 350 MG/ML SOLN COMPARISON:  Chest x-ray from earlier today. FINDINGS: Central airways are within normal limits. There are a couple of tiny nodules in the right apex with the largest measuring 3 mm as seen on coronal image 78. There is a  small nodule along the left major fissure, possibly an intrafissural lymph node measuring 6 x 4.5 mm on axial image 58. Another tiny nodule is seen along the left major fissure on axial image 45. No masses or suspicious infiltrates. No adenopathy. No effusions. Heart size is normal. The thoracic aorta is normal in caliber. No aortic dissection. No pulmonary emboli identified. Evaluation of the upper abdomen is limited due to arterila phase imaging. No abnormalities Visualized bones are within normal limits. Review of the MIP images confirms the above findings. IMPRESSION: 1. No pulmonary emboli or acute abnormality. 2. Scattered pulmonary nodules as described above. The  largest measures 6 x 4.5 mm along the left major fissure. See below for follow-up recommendations. Non-contrast chest CT at 6-12 months is recommended. If the nodule is stable at time of repeat CT, then future CT at 18-24 months (from today's scan) is considered optional for low-risk patients, but is recommended for high-risk patients. This recommendation follows the consensus statement: Guidelines for Management of Incidental Pulmonary Nodules Detected on CT Images:From the Fleischner Society 2017; published online before print (10.1148/[id number]). Electronically Signed   By: Gerome Sam III M.D   On: 11/13/2015 18:42    Micro Results   Recent Results (from the past 240 hour(s))  Culture, Urine     Status: None (Preliminary result)   Collection Time: 11/13/15  1:49 PM  Result Value Ref Range Status   Specimen Description URINE, RANDOM  Final   Special Requests NONE  Final   Culture   Final    TOO YOUNG TO READ Performed at [facility]    Report Status PENDING  Incomplete       Today   Subjective:   Marjori Mersinger denies any complaints..   Objective:   Blood pressure 114/83, pulse 90, temperature 97.9 F (36.6 C), temperature source Oral, resp. rate 16, height 5\' 5"  (1.651 m), weight 57.38 kg (126 lb 8  oz), SpO2 100 %.   Intake/Output Summary (Last 24 hours) at 11/14/15 1528 Last data filed at 11/14/15 0500  Gross per 24 hour  Intake    900 ml  Output      0 ml  Net    900 ml    Exam Unremarkable.  Data Review   CBC w Diff:  Lab Results  Component Value Date   WBC 10.6* 11/14/2015   HGB 14.6 11/14/2015   HCT 43.5 11/14/2015   PLT 327 11/14/2015   LYMPHOPCT 24 11/14/2015   MONOPCT 7 11/14/2015   EOSPCT 2 11/14/2015   BASOPCT 0 11/14/2015    CMP:  Lab Results  Component Value Date   NA 138 11/14/2015   K 3.6 11/14/2015   CL 107 11/14/2015   CO2 22 11/14/2015   BUN <5* 11/14/2015   CREATININE 0.93 11/14/2015   PROT 7.4 11/14/2015   ALBUMIN 4.1 11/14/2015   BILITOT 0.5 11/14/2015   ALKPHOS 53 11/14/2015   AST 17 11/14/2015   ALT 13* 11/14/2015  .   Total Time in preparing paper work, data evaluation and todays exam -  20 minutes  Ordell Prichett M.D on 11/14/2015 at 3:28 PM  [facility] Office  [phone]

## 2015-11-14 NOTE — Progress Notes (Signed)
VASCULAR LAB PRELIMINARY  PRELIMINARY  PRELIMINARY  PRELIMINARY  Bilateral lower extremity venous duplex completed.    Preliminary report:  Bilateral:  No evidence of DVT, superficial thrombosis, or Baker's Cyst.   Emily Park, RVS 11/14/2015, 8:56 AM

## 2015-11-15 ENCOUNTER — Telehealth: Payer: Self-pay | Admitting: *Deleted

## 2015-11-15 ENCOUNTER — Telehealth: Payer: Self-pay

## 2015-11-15 ENCOUNTER — Telehealth: Payer: Self-pay | Admitting: Cardiology

## 2015-11-15 DIAGNOSIS — R079 Chest pain, unspecified: Secondary | ICD-10-CM

## 2015-11-15 DIAGNOSIS — R002 Palpitations: Secondary | ICD-10-CM

## 2015-11-15 LAB — URINE CULTURE

## 2015-11-15 NOTE — Telephone Encounter (Signed)
Left a message for the patient to call back and schedule her GXT and followup with Dr. Percival Spanish.

## 2015-11-15 NOTE — Telephone Encounter (Signed)
Admission date: 11/12/2015 Admitting Physician Edwin Dada, MD  Discharge Date: 11/14/2015   Primary MD Garnet Koyanagi, DO  Recommendations for primary care physician for things to follow:  Follow Qtc/heart rate, ?refer to EP cardiology, Follow lung nodules in 6-9 months  Admission Diagnosis  Hypokalemia [E87.6] Heart palpitations [R00.2]   Discharge Diagnosis  Hypokalemia [E87.6] Heart palpitations [R00.2]  Principal Problem:  Hypokalemia Active Problems:  Paroxysmal SVT (supraventricular tachycardia) (HCC)  Hypomagnesemia  Attention deficit hyperactivity disorder (ADHD)  Chest pain  Lung nodules   Left a message for call back.

## 2015-11-15 NOTE — Telephone Encounter (Signed)
Order created. msg sent to scheduling.

## 2015-11-15 NOTE — Telephone Encounter (Signed)
-----   Message from Minus Breeding, MD sent at 11/14/2015 11:20 AM EDT ----- Needs out patient POET ([employer]) and follow up with Dr. Stanford Breed.  Being discharged.  Please call her at home to schedule.

## 2015-11-16 ENCOUNTER — Ambulatory Visit
Admission: RE | Admit: 2015-11-16 | Discharge: 2015-11-16 | Disposition: A | Discharge status: Home / Self Care | Payer: 59 | Source: Ambulatory Visit | Attending: Obstetrics and Gynecology | Admitting: Obstetrics and Gynecology

## 2015-11-16 ENCOUNTER — Other Ambulatory Visit: Payer: Self-pay | Admitting: Obstetrics and Gynecology

## 2015-11-16 DIAGNOSIS — R928 Other abnormal and inconclusive findings on diagnostic imaging of breast: Secondary | ICD-10-CM

## 2015-11-25 ENCOUNTER — Ambulatory Visit: Payer: 59 | Admitting: Family Medicine

## 2015-11-25 ENCOUNTER — Telehealth: Payer: Self-pay | Admitting: Family Medicine

## 2015-11-25 NOTE — Telephone Encounter (Signed)
charge 

## 2015-11-25 NOTE — Telephone Encounter (Signed)
Patient LVM 11/25/15 at 9:43am cancelling her 11:30am appointment patient will call back to [facility]. Charge or no charge

## 2015-11-26 ENCOUNTER — Encounter: Payer: Self-pay | Admitting: Family Medicine

## 2015-11-29 NOTE — Telephone Encounter (Signed)
Marked to charge, mailing no show letter °

## 2015-12-03 ENCOUNTER — Telehealth (HOSPITAL_COMMUNITY): Payer: Self-pay

## 2015-12-03 NOTE — Telephone Encounter (Signed)
Encounter complete. 

## 2015-12-08 ENCOUNTER — Inpatient Hospital Stay (HOSPITAL_COMMUNITY): Admission: RE | Admit: 2015-12-08 | Payer: 59 | Source: Ambulatory Visit

## 2015-12-27 NOTE — Progress Notes (Signed)
No show

## 2015-12-28 ENCOUNTER — Encounter: Payer: 59 | Admitting: Cardiology

## 2015-12-28 ENCOUNTER — Encounter: Payer: Self-pay | Admitting: *Deleted

## 2018-09-17 ENCOUNTER — Telehealth: Payer: Self-pay | Admitting: Internal Medicine

## 2018-09-17 NOTE — Telephone Encounter (Signed)
Copied from [place]. Topic: Appointment Scheduling - New Patient >> Sep 17, 2018  2:57 PM Emily Park wrote: Patient would like to know if she can see Dr. Alain Marion as a new patient and her son as well, he is 35. Her mom has been seeing Dr. Alain Marion for at least 25 years, Emily Park dob [birth year] .Please advise. Provider: Dr. Alain Marion    Best call back number [phone]  Route to department's PEC pool.

## 2018-09-17 NOTE — Telephone Encounter (Signed)
Ok Thx 

## 2018-09-18 NOTE — Telephone Encounter (Signed)
Left patient vm to call back to schedule appt to establish care with Dr. Alain Marion.

## 2018-10-10 DIAGNOSIS — R55 Syncope and collapse: Secondary | ICD-10-CM | POA: Insufficient documentation

## 2018-10-24 DIAGNOSIS — R7989 Other specified abnormal findings of blood chemistry: Secondary | ICD-10-CM | POA: Insufficient documentation

## 2018-10-25 DIAGNOSIS — F330 Major depressive disorder, recurrent, mild: Secondary | ICD-10-CM | POA: Insufficient documentation

## 2019-02-26 ENCOUNTER — Encounter: Payer: Self-pay | Admitting: *Deleted

## 2019-02-27 ENCOUNTER — Other Ambulatory Visit: Payer: Self-pay

## 2019-02-27 ENCOUNTER — Ambulatory Visit (INDEPENDENT_AMBULATORY_CARE_PROVIDER_SITE_OTHER): Payer: 59 | Admitting: Neurology

## 2019-02-27 ENCOUNTER — Encounter: Payer: Self-pay | Admitting: Neurology

## 2019-02-27 VITALS — BP 90/58 | HR 76 | Temp 97.8°F | Ht 64.5 in | Wt 117.0 lb

## 2019-02-27 DIAGNOSIS — G43709 Chronic migraine without aura, not intractable, without status migrainosus: Secondary | ICD-10-CM | POA: Diagnosis not present

## 2019-02-27 DIAGNOSIS — IMO0002 Reserved for concepts with insufficient information to code with codable children: Secondary | ICD-10-CM

## 2019-02-27 DIAGNOSIS — G43909 Migraine, unspecified, not intractable, without status migrainosus: Secondary | ICD-10-CM | POA: Insufficient documentation

## 2019-02-27 MED ORDER — AIMOVIG 70 MG/ML ~~LOC~~ SOAJ: 70.0000 mg | SUBCUTANEOUS | 11 refills | Status: DC

## 2019-02-27 MED ORDER — ONDANSETRON 4 MG PO TBDP: 4.0000 mg | ORAL_TABLET | Freq: Three times a day (TID) | ORAL | 6 refills | Status: DC | PRN

## 2019-02-27 MED ORDER — TOPIRAMATE 100 MG PO TABS: 150.0000 mg | ORAL_TABLET | Freq: Two times a day (BID) | ORAL | 11 refills | Status: DC

## 2019-02-27 MED ORDER — TIZANIDINE HCL 4 MG PO TABS: 4.0000 mg | ORAL_TABLET | Freq: Four times a day (QID) | ORAL | 6 refills | Status: DC | PRN

## 2019-02-27 MED ORDER — RIZATRIPTAN BENZOATE 10 MG PO TBDP: 10.0000 mg | ORAL_TABLET | ORAL | 6 refills | Status: DC | PRN

## 2019-02-27 NOTE — Progress Notes (Signed)
PATIENT: Emily Park DOB: 1971-01-02  Chief Complaint  Patient presents with  . Chronic headaches    Reports having 3-4 headache days per week.  She uses ibuprofen 800mg  or hydrocodone 7.5-200mg , depending on the pain level.  She is taking topiramate 50mg , three tablets daily for prevention.  [facility]    Brien Few, MD (no PCP)     HISTORICAL  Emily Park is a 48 year old female, seen in request by her primary care physician Dr. Brien Few, for evaluation of chronic migraine headache initial evaluation was on February 27, 2019.  I have reviewed and summarized the referring note from the referring physician.  She had a past medical history of depression, chronic migraine headaches, is on polypharmacy treatment, Topamax 150 mg daily, Wellbutrin 200 mg daily, Prozac 40 mg daily  She presented with migraine headaches since 20s, gradually increased frequency, now she is having headaches 2-3 times each week, bilateral retro-orbital area, severe pounding headache with associated light, noise sensitivity, she has to lie down in dark quiet room for hours to days to help her headaches  She has been treated with Topamax 150 mg daily, initially was very helpful, for abortive treatment, she is currently taking ibuprofen 800 mg, to codon as needed for more prolonged severe headaches  Previously she has tried Imitrex, was helpful initially, but later trial was not as helpful  She has never tried any other preventive medication or triptans  REVIEW OF SYSTEMS: Full 14 system review of systems performed and notable only for as above All other review of systems were negative.  ALLERGIES: Allergies  Allergen Reactions  . Amoxicillin Rash  . Bactrim [Sulfamethoxazole-Trimethoprim] Rash    Rash noted to chest and back  . Zithromax [Azithromycin] Rash    HOME MEDICATIONS: Current Outpatient Medications  Medication Sig Dispense Refill  . amphetamine-dextroamphetamine (ADDERALL XR) 30 MG  24 hr capsule Take 30 mg by mouth daily as needed.    Marland Kitchen BIOTIN PO Take 1 tablet by mouth daily.    Marland Kitchen buPROPion HCl (WELLBUTRIN XL PO) Take 200 mg by mouth daily.    . clonazePAM (KLONOPIN) 0.5 MG tablet Take 0.25 mg by mouth 2 (two) times daily as needed for anxiety.    . diphenhydrAMINE HCl (BENADRYL ALLERGY PO) Take 1 tablet by mouth as needed.    Marland Kitchen FLUoxetine (PROZAC) 40 MG capsule Take 40 mg by mouth daily.    Marland Kitchen HYDROcodone-ibuprofen (VICOPROFEN) 7.5-200 MG tablet Take 1 tablet by mouth every 4 (four) hours as needed for moderate pain.    . Multiple Vitamin (MULTIVITAMIN PO) Take 1 tablet by mouth daily.    Marland Kitchen topiramate (TOPAMAX) 50 MG tablet Take 150 mg by mouth daily.     No current facility-administered medications for this visit.     PAST MEDICAL HISTORY: Past Medical History:  Diagnosis Date  . Abnormal TSH   . ADD (attention deficit disorder)   . Anemia   . Anxiety   . Arrhythmia   . Decreased libido   . Depression   . Heart murmur   . Hyperlipidemia   . Hypokalemia   . Migraine   . Pap smear abnormality of cervix/human papillomavirus (HPV) positive   . Sleep difficulties   . SVT (supraventricular tachycardia) ([place])     PAST SURGICAL HISTORY: Past Surgical History:  Procedure Laterality Date  . ABLATION     Cardiac ablation, endometrial ablation  . BREAST LUMPECTOMY     right breast  .  BREAST LUMPECTOMY Right   . CARDIOVERSION    . COLPOSCOPY     w/ cervical biopsy  . DILITATION & CURRETTAGE/HYSTROSCOPY WITH NOVASURE ABLATION N/A 09/11/2014   Procedure: DILATATION & CURETTAGE/HYSTEROSCOPY WITH NOVASURE ABLATION;  Surgeon: Lovenia Kim, MD;  Location: [facility];  Service: Gynecology;  Laterality: N/A;    FAMILY HISTORY: Family History  Problem Relation Age of Onset  . Hypothyroidism Mother   . Depression Mother   . Cancer Mother        thyroid  . Hypertension Father   . Heart attack Father        Age 94  . AAA (abdominal aortic aneurysm) Father   .  Hypothyroidism Sister   . Hyperthyroidism Sister   . Diabetes Maternal Grandfather   . Breast cancer Paternal Grandmother     SOCIAL HISTORY: Social History   Socioeconomic History  . Marital status: Legally Separated    Spouse name: Not on file  . Number of children: 2  . Years of education: college  . Highest education level: Not on file  Occupational History  . 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 Financial resource strain: Not on file  . Food insecurity    Worry: Not on file    Inability: Not on file  . Transportation needs    Medical: Not on file    Non-medical: Not on file  Tobacco Use  . Smoking status: Never Smoker  . Smokeless tobacco: Never Used  Substance and Sexual Activity  . Alcohol use: Yes    Comment: occasional wine  . Drug use: Not Currently  . Sexual activity: Yes    Partners: Male    Comment: Married  Lifestyle  . Physical activity    Days per week: Not on file    Minutes per session: Not on file  . Stress: Not on file  Relationships  . Social Herbalist on phone: Not on file    Gets together: Not on file    Attends religious service: Not on file    Active member of club or organization: Not on file    Attends meetings of clubs or organizations: Not on file    Relationship status: Not on file  . Intimate partner violence    Fear of current or ex partner: Not on file    Emotionally abused: Not on file    Physically abused: Not on file    Forced sexual activity: Not on file  Other Topics Concern  . Not on file  Social History Narrative   Lives at home with two sons.   Right-handed.   60 ounces of tea and soda per day.     PHYSICAL EXAM   Vitals:   02/27/19 1509  BP: (!) 90/58  Pulse: 76  Temp: 97.8 F (36.6 C)  Weight: 117 lb (53.1 kg)  Height: 5' 4.5" (1.638 m)    Not recorded      Body mass index is 19.77 kg/m.  PHYSICAL EXAMNIATION:  Gen: NAD, conversant, well nourised, obese,  well groomed                     Cardiovascular: Regular rate rhythm, no peripheral edema, warm, nontender. Eyes: Conjunctivae clear without exudates or hemorrhage Neck: Supple, no carotid bruits. Pulmonary: Clear to auscultation bilaterally   NEUROLOGICAL EXAM:  MENTAL STATUS: Speech:    Speech is normal; fluent and spontaneous with  normal comprehension.  Cognition:     Orientation to time, place and person     Normal recent and remote memory     Normal Attention span and concentration     Normal Language, naming, repeating,spontaneous speech     Fund of knowledge   CRANIAL NERVES: CN II: Visual fields are full to confrontation.  pupils are round equal and briskly reactive to light. CN III, IV, VI: extraocular movement are normal. No ptosis. CN V: Facial sensation is intact to pinprick in all 3 divisions bilaterally. Corneal responses are intact.  CN VII: Face is symmetric with normal eye closure and smile. CN VIII: Hearing is normal to rubbing fingers CN IX, X: Palate elevates symmetrically. Phonation is normal. CN XI: Head turning and shoulder shrug are intact CN XII: Tongue is midline with normal movements and no atrophy.  MOTOR: There is no pronator drift of out-stretched arms. Muscle bulk and tone are normal. Muscle strength is normal.  REFLEXES: Reflexes are 2+ and symmetric at the biceps, triceps, knees, and ankles. Plantar responses are flexor.  SENSORY: Intact to light touch, pinprick, positional sensation and vibratory sensation are intact in fingers and toes.  COORDINATION: Rapid alternating movements and fine finger movements are intact. There is no dysmetria on finger-to-nose and heel-knee-shin.    GAIT/STANCE: Posture is normal. Gait is steady with normal steps, base, arm swing, and turning. Heel and toe walking are normal. Tandem gait is normal.  Romberg is absent.   DIAGNOSTIC DATA (LABS, IMAGING, TESTING) - I reviewed patient records, labs, notes,  testing and imaging myself where available.   ASSESSMENT AND PLAN  Emily Park is a 48 y.o. female   Chronic migraine headaches  Increase Topamax to 100 mg twice a day as preventive medications  Aimovig 70 mg for preventive treatment  Maxalt as needed may combine with Zofran, Aleve, tizanidine as needed  Return to clinic with Judson Roch in 2 months   Marcial Pacas, M.D. Ph.D.  [facility] [address] [place], [place] [address] Ph: [phone] Fax: [phone]  CC: Brien Few, MD

## 2019-02-27 NOTE — Patient Instructions (Signed)
You may take  Maxalt 10 mg zofran 4 mg Aleve 220mg  1-2 tabs  Tizanidine 4mg    As needed for migraine headaches.

## 2019-09-17 ENCOUNTER — Ambulatory Visit: Payer: No Typology Code available for payment source | Admitting: Internal Medicine

## 2019-09-17 DIAGNOSIS — Z0289 Encounter for other administrative examinations: Secondary | ICD-10-CM

## 2020-01-19 ENCOUNTER — Other Ambulatory Visit: Payer: Self-pay | Admitting: Neurology

## 2020-04-05 DIAGNOSIS — K623 Rectal prolapse: Secondary | ICD-10-CM | POA: Insufficient documentation

## 2020-08-29 ENCOUNTER — Other Ambulatory Visit: Payer: Self-pay

## 2020-08-29 ENCOUNTER — Emergency Department (HOSPITAL_COMMUNITY): Payer: Commercial Managed Care - PPO

## 2020-08-29 ENCOUNTER — Encounter (HOSPITAL_COMMUNITY): Payer: Self-pay | Admitting: Emergency Medicine

## 2020-08-29 ENCOUNTER — Inpatient Hospital Stay (HOSPITAL_COMMUNITY)
Admission: EM | Admit: 2020-08-29 | Discharge: 2020-09-02 | DRG: 682 | Disposition: A | Discharge status: Home / Self Care | Payer: Commercial Managed Care - PPO | Attending: Family Medicine | Admitting: Family Medicine

## 2020-08-29 DIAGNOSIS — R918 Other nonspecific abnormal finding of lung field: Secondary | ICD-10-CM | POA: Diagnosis present

## 2020-08-29 DIAGNOSIS — E271 Primary adrenocortical insufficiency: Secondary | ICD-10-CM | POA: Diagnosis present

## 2020-08-29 DIAGNOSIS — I9589 Other hypotension: Secondary | ICD-10-CM | POA: Diagnosis not present

## 2020-08-29 DIAGNOSIS — E871 Hypo-osmolality and hyponatremia: Secondary | ICD-10-CM | POA: Diagnosis present

## 2020-08-29 DIAGNOSIS — F32A Depression, unspecified: Secondary | ICD-10-CM | POA: Diagnosis present

## 2020-08-29 DIAGNOSIS — E876 Hypokalemia: Secondary | ICD-10-CM | POA: Diagnosis present

## 2020-08-29 DIAGNOSIS — Z803 Family history of malignant neoplasm of breast: Secondary | ICD-10-CM | POA: Diagnosis not present

## 2020-08-29 DIAGNOSIS — E785 Hyperlipidemia, unspecified: Secondary | ICD-10-CM | POA: Diagnosis present

## 2020-08-29 DIAGNOSIS — D649 Anemia, unspecified: Secondary | ICD-10-CM | POA: Diagnosis present

## 2020-08-29 DIAGNOSIS — I959 Hypotension, unspecified: Secondary | ICD-10-CM

## 2020-08-29 DIAGNOSIS — Z881 Allergy status to other antibiotic agents status: Secondary | ICD-10-CM

## 2020-08-29 DIAGNOSIS — Z818 Family history of other mental and behavioral disorders: Secondary | ICD-10-CM

## 2020-08-29 DIAGNOSIS — A419 Sepsis, unspecified organism: Secondary | ICD-10-CM

## 2020-08-29 DIAGNOSIS — R6521 Severe sepsis with septic shock: Secondary | ICD-10-CM | POA: Diagnosis not present

## 2020-08-29 DIAGNOSIS — N179 Acute kidney failure, unspecified: Principal | ICD-10-CM | POA: Diagnosis present

## 2020-08-29 DIAGNOSIS — Z79899 Other long term (current) drug therapy: Secondary | ICD-10-CM | POA: Diagnosis not present

## 2020-08-29 DIAGNOSIS — Z833 Family history of diabetes mellitus: Secondary | ICD-10-CM

## 2020-08-29 DIAGNOSIS — F909 Attention-deficit hyperactivity disorder, unspecified type: Secondary | ICD-10-CM | POA: Diagnosis present

## 2020-08-29 DIAGNOSIS — F419 Anxiety disorder, unspecified: Secondary | ICD-10-CM | POA: Diagnosis present

## 2020-08-29 DIAGNOSIS — Z8249 Family history of ischemic heart disease and other diseases of the circulatory system: Secondary | ICD-10-CM

## 2020-08-29 DIAGNOSIS — I471 Supraventricular tachycardia: Secondary | ICD-10-CM | POA: Diagnosis present

## 2020-08-29 DIAGNOSIS — E860 Dehydration: Secondary | ICD-10-CM | POA: Diagnosis present

## 2020-08-29 DIAGNOSIS — E872 Acidosis: Secondary | ICD-10-CM | POA: Diagnosis present

## 2020-08-29 DIAGNOSIS — R571 Hypovolemic shock: Secondary | ICD-10-CM | POA: Diagnosis present

## 2020-08-29 DIAGNOSIS — Z20822 Contact with and (suspected) exposure to covid-19: Secondary | ICD-10-CM | POA: Diagnosis present

## 2020-08-29 DIAGNOSIS — R55 Syncope and collapse: Secondary | ICD-10-CM | POA: Diagnosis present

## 2020-08-29 DIAGNOSIS — E861 Hypovolemia: Secondary | ICD-10-CM | POA: Diagnosis not present

## 2020-08-29 DIAGNOSIS — G43909 Migraine, unspecified, not intractable, without status migrainosus: Secondary | ICD-10-CM | POA: Diagnosis present

## 2020-08-29 DIAGNOSIS — N170 Acute kidney failure with tubular necrosis: Secondary | ICD-10-CM | POA: Diagnosis not present

## 2020-08-29 LAB — CBC WITH DIFFERENTIAL/PLATELET
Abs Immature Granulocytes: 0.23 10*3/uL — ABNORMAL HIGH (ref 0.00–0.07)
Basophils Absolute: 0.1 10*3/uL (ref 0.0–0.1)
Basophils Relative: 0 %
Eosinophils Absolute: 0 10*3/uL (ref 0.0–0.5)
Eosinophils Relative: 0 %
HCT: 32.6 % — ABNORMAL LOW (ref 36.0–46.0)
Hemoglobin: 9.8 g/dL — ABNORMAL LOW (ref 12.0–15.0)
Immature Granulocytes: 1 %
Lymphocytes Relative: 7 %
Lymphs Abs: 1.7 10*3/uL (ref 0.7–4.0)
MCH: 25.5 pg — ABNORMAL LOW (ref 26.0–34.0)
MCHC: 30.1 g/dL (ref 30.0–36.0)
MCV: 84.7 fL (ref 80.0–100.0)
Monocytes Absolute: 1.2 10*3/uL — ABNORMAL HIGH (ref 0.1–1.0)
Monocytes Relative: 5 %
Neutro Abs: 22.6 10*3/uL — ABNORMAL HIGH (ref 1.7–7.7)
Neutrophils Relative %: 87 %
Platelets: 717 10*3/uL — ABNORMAL HIGH (ref 150–400)
RBC: 3.85 MIL/uL — ABNORMAL LOW (ref 3.87–5.11)
RDW: 15.4 % (ref 11.5–15.5)
WBC: 25.8 10*3/uL — ABNORMAL HIGH (ref 4.0–10.5)
nRBC: 0 % (ref 0.0–0.2)

## 2020-08-29 LAB — COMPREHENSIVE METABOLIC PANEL
ALT: 19 U/L (ref 0–44)
AST: 19 U/L (ref 15–41)
Albumin: 3.7 g/dL (ref 3.5–5.0)
Alkaline Phosphatase: 73 U/L (ref 38–126)
Anion gap: 24 — ABNORMAL HIGH (ref 5–15)
BUN: 80 mg/dL — ABNORMAL HIGH (ref 6–20)
CO2: 11 mmol/L — ABNORMAL LOW (ref 22–32)
Calcium: 8.2 mg/dL — ABNORMAL LOW (ref 8.9–10.3)
Chloride: 97 mmol/L — ABNORMAL LOW (ref 98–111)
Creatinine, Ser: 8.02 mg/dL — ABNORMAL HIGH (ref 0.44–1.00)
GFR, Estimated: 6 mL/min — ABNORMAL LOW (ref >60)
Glucose, Bld: 137 mg/dL — ABNORMAL HIGH (ref 70–99)
Potassium: 3.9 mmol/L (ref 3.5–5.1)
Sodium: 132 mmol/L — ABNORMAL LOW (ref 135–145)
Total Bilirubin: 0.4 mg/dL (ref 0.3–1.2)
Total Protein: 8.8 g/dL — ABNORMAL HIGH (ref 6.5–8.1)

## 2020-08-29 LAB — CBC
HCT: 21.2 % — ABNORMAL LOW (ref 36.0–46.0)
Hemoglobin: 6.3 g/dL — CL (ref 12.0–15.0)
MCH: 25.6 pg — ABNORMAL LOW (ref 26.0–34.0)
MCHC: 29.7 g/dL — ABNORMAL LOW (ref 30.0–36.0)
MCV: 86.2 fL (ref 80.0–100.0)
Platelets: 401 10*3/uL — ABNORMAL HIGH (ref 150–400)
RBC: 2.46 MIL/uL — ABNORMAL LOW (ref 3.87–5.11)
RDW: 15.3 % (ref 11.5–15.5)
WBC: 17.2 10*3/uL — ABNORMAL HIGH (ref 4.0–10.5)
nRBC: 0 % (ref 0.0–0.2)

## 2020-08-29 LAB — BASIC METABOLIC PANEL
Anion gap: 14 (ref 5–15)
BUN: 59 mg/dL — ABNORMAL HIGH (ref 6–20)
CO2: 13 mmol/L — ABNORMAL LOW (ref 22–32)
Calcium: 6.9 mg/dL — ABNORMAL LOW (ref 8.9–10.3)
Chloride: 107 mmol/L (ref 98–111)
Creatinine, Ser: 5.02 mg/dL — ABNORMAL HIGH (ref 0.44–1.00)
GFR, Estimated: 10 mL/min — ABNORMAL LOW (ref >60)
Glucose, Bld: 96 mg/dL (ref 70–99)
Potassium: 3.3 mmol/L — ABNORMAL LOW (ref 3.5–5.1)
Sodium: 134 mmol/L — ABNORMAL LOW (ref 135–145)

## 2020-08-29 LAB — RAPID URINE DRUG SCREEN, HOSP PERFORMED
Amphetamines: NOT DETECTED
Barbiturates: NOT DETECTED
Benzodiazepines: POSITIVE — AB
Cocaine: NOT DETECTED
Opiates: NOT DETECTED
Tetrahydrocannabinol: NOT DETECTED

## 2020-08-29 LAB — URINALYSIS, ROUTINE W REFLEX MICROSCOPIC
Bilirubin Urine: NEGATIVE
Glucose, UA: NEGATIVE mg/dL
Hgb urine dipstick: NEGATIVE
Ketones, ur: NEGATIVE mg/dL
Nitrite: NEGATIVE
Protein, ur: 100 mg/dL — AB
Specific Gravity, Urine: 1.016 (ref 1.005–1.030)
pH: 5 (ref 5.0–8.0)

## 2020-08-29 LAB — PROTIME-INR
INR: 1.2 (ref 0.8–1.2)
Prothrombin Time: 14.4 seconds (ref 11.4–15.2)

## 2020-08-29 LAB — CREATININE, SERUM
Creatinine, Ser: 6.32 mg/dL — ABNORMAL HIGH (ref 0.44–1.00)
GFR, Estimated: 8 mL/min — ABNORMAL LOW (ref >60)

## 2020-08-29 LAB — ABO/RH: ABO/RH(D): O POS

## 2020-08-29 LAB — HEMOGLOBIN AND HEMATOCRIT, BLOOD
HCT: 21.2 % — ABNORMAL LOW (ref 36.0–46.0)
Hemoglobin: 6.3 g/dL — CL (ref 12.0–15.0)

## 2020-08-29 LAB — LACTIC ACID, PLASMA
Lactic Acid, Venous: 3.8 mmol/L (ref 0.5–1.9)
Lactic Acid, Venous: 4.8 mmol/L (ref 0.5–1.9)

## 2020-08-29 LAB — RESP PANEL BY RT-PCR (FLU A&B, COVID) ARPGX2
Influenza A by PCR: NEGATIVE
Influenza B by PCR: NEGATIVE
SARS Coronavirus 2 by RT PCR: NEGATIVE

## 2020-08-29 LAB — APTT: aPTT: 24 seconds (ref 24–36)

## 2020-08-29 LAB — MAGNESIUM: Magnesium: 1.3 mg/dL — ABNORMAL LOW (ref 1.7–2.4)

## 2020-08-29 LAB — PROCALCITONIN: Procalcitonin: 0.12 ng/mL

## 2020-08-29 LAB — PHOSPHORUS: Phosphorus: 6.8 mg/dL — ABNORMAL HIGH (ref 2.5–4.6)

## 2020-08-29 LAB — ETHANOL: Alcohol, Ethyl (B): <10 mg/dL (ref <10)

## 2020-08-29 LAB — CBG MONITORING, ED: Glucose-Capillary: 107 mg/dL — ABNORMAL HIGH (ref 70–99)

## 2020-08-29 LAB — HIV ANTIBODY (ROUTINE TESTING W REFLEX): HIV Screen 4th Generation wRfx: NONREACTIVE

## 2020-08-29 LAB — PREPARE RBC (CROSSMATCH)

## 2020-08-29 MED ORDER — CHLORHEXIDINE GLUCONATE CLOTH 2 % EX PADS
6.0000 | MEDICATED_PAD | Freq: Every day | CUTANEOUS | Status: DC
  Administered 2020-08-30: 6 via TOPICAL

## 2020-08-29 MED ORDER — VANCOMYCIN HCL IN DEXTROSE 1-5 GM/200ML-% IV SOLN
1000.0000 mg | Freq: Once | INTRAVENOUS | Status: AC
  Administered 2020-08-29: 1000 mg via INTRAVENOUS
  Filled 2020-08-29: qty 200

## 2020-08-29 MED ORDER — LACTATED RINGERS IV SOLN: INTRAVENOUS | Status: DC

## 2020-08-29 MED ORDER — PHENYLEPHRINE HCL-NACL 10-0.9 MG/250ML-% IV SOLN
25.0000 ug/min | INTRAVENOUS | Status: DC
  Administered 2020-08-29: 25 ug/min via INTRAVENOUS
  Administered 2020-08-29 (×4): 165 ug/min via INTRAVENOUS
  Administered 2020-08-30: 145 ug/min via INTRAVENOUS
  Administered 2020-08-30: 165 ug/min via INTRAVENOUS
  Administered 2020-08-30: 125 ug/min via INTRAVENOUS
  Administered 2020-08-30: 115 ug/min via INTRAVENOUS
  Administered 2020-08-30: 105 ug/min via INTRAVENOUS
  Administered 2020-08-30: 155 ug/min via INTRAVENOUS
  Administered 2020-08-30: 125 ug/min via INTRAVENOUS
  Administered 2020-08-30: 165 ug/min via INTRAVENOUS
  Administered 2020-08-30: 70 ug/min via INTRAVENOUS
  Administered 2020-08-30: 165 ug/min via INTRAVENOUS
  Filled 2020-08-29: qty 250
  Filled 2020-08-29: qty 500
  Filled 2020-08-29 (×2): qty 250
  Filled 2020-08-29: qty 750
  Filled 2020-08-29 (×7): qty 250

## 2020-08-29 MED ORDER — VANCOMYCIN VARIABLE DOSE PER UNSTABLE RENAL FUNCTION (PHARMACIST DOSING): Status: DC

## 2020-08-29 MED ORDER — LACTATED RINGERS IV BOLUS
1000.0000 mL | Freq: Once | INTRAVENOUS | Status: AC
  Administered 2020-08-29: 1000 mL via INTRAVENOUS

## 2020-08-29 MED ORDER — METRONIDAZOLE 500 MG PO TABS: 500.0000 mg | ORAL_TABLET | Freq: Three times a day (TID) | ORAL | Status: DC

## 2020-08-29 MED ORDER — POLYETHYLENE GLYCOL 3350 17 G PO PACK: 17.0000 g | PACK | Freq: Every day | ORAL | Status: DC | PRN

## 2020-08-29 MED ORDER — SODIUM CHLORIDE 0.9 % IV SOLN
1.0000 g | INTRAVENOUS | Status: DC
  Filled 2020-08-29: qty 1

## 2020-08-29 MED ORDER — LACTATED RINGERS IV BOLUS (SEPSIS)
250.0000 mL | Freq: Once | INTRAVENOUS | Status: AC
  Administered 2020-08-29: 250 mL via INTRAVENOUS

## 2020-08-29 MED ORDER — SODIUM CHLORIDE 0.9 % IV SOLN: 2.0000 g | Freq: Once | INTRAVENOUS | Status: DC

## 2020-08-29 MED ORDER — DOCUSATE SODIUM 100 MG PO CAPS: 100.0000 mg | ORAL_CAPSULE | Freq: Two times a day (BID) | ORAL | Status: DC | PRN

## 2020-08-29 MED ORDER — LACTATED RINGERS IV BOLUS (SEPSIS)
1000.0000 mL | Freq: Once | INTRAVENOUS | Status: AC
  Administered 2020-08-29: 1000 mL via INTRAVENOUS

## 2020-08-29 MED ORDER — SODIUM CHLORIDE 0.9 % IV SOLN
2.0000 g | Freq: Once | INTRAVENOUS | Status: AC
  Administered 2020-08-29: 2 g via INTRAVENOUS
  Filled 2020-08-29: qty 2

## 2020-08-29 MED ORDER — METRONIDAZOLE IN NACL 5-0.79 MG/ML-% IV SOLN
500.0000 mg | Freq: Once | INTRAVENOUS | Status: AC
  Administered 2020-08-29: 500 mg via INTRAVENOUS
  Filled 2020-08-29: qty 100

## 2020-08-29 MED ORDER — CLONAZEPAM 0.125 MG PO TBDP
0.2500 mg | ORAL_TABLET | Freq: Two times a day (BID) | ORAL | Status: DC | PRN
  Administered 2020-08-30: 0.25 mg via ORAL
  Filled 2020-08-29: qty 2

## 2020-08-29 MED ORDER — LACTATED RINGERS IV BOLUS (SEPSIS)
500.0000 mL | Freq: Once | INTRAVENOUS | Status: AC
  Administered 2020-08-29: 500 mL via INTRAVENOUS

## 2020-08-29 MED ORDER — HEPARIN SODIUM (PORCINE) 5000 UNIT/ML IJ SOLN: 5000.0000 [IU] | Freq: Three times a day (TID) | INTRAMUSCULAR | Status: DC

## 2020-08-29 MED ORDER — METRONIDAZOLE 500 MG PO TABS
500.0000 mg | ORAL_TABLET | Freq: Three times a day (TID) | ORAL | Status: DC
  Administered 2020-08-30 – 2020-09-01 (×7): 500 mg via ORAL
  Filled 2020-08-29 (×7): qty 1

## 2020-08-29 MED ORDER — SODIUM CHLORIDE 0.9% IV SOLUTION: Freq: Once | INTRAVENOUS | Status: DC

## 2020-08-29 MED ORDER — MAGNESIUM SULFATE 2 GM/50ML IV SOLN
2.0000 g | Freq: Once | INTRAVENOUS | Status: AC
  Administered 2020-08-30: 2 g via INTRAVENOUS
  Filled 2020-08-29: qty 50

## 2020-08-29 MED ORDER — SODIUM CHLORIDE 0.9 % IV SOLN
250.0000 mL | INTRAVENOUS | Status: DC
  Administered 2020-08-29 – 2020-08-31 (×2): 250 mL via INTRAVENOUS

## 2020-08-29 NOTE — Progress Notes (Signed)
Pharmacy Antibiotic Note  Emily Park is a 50 y.o. female admitted on 08/29/2020 with sepsis with noted AKI and SCr of 8. Pharmacy has been consulted for vanc/cefepime dosing.  Plan: 1) Vanc 1g IV x 1 already given in ED at 1533, Will not redose vanc at this point and will check a random level probably on 1/4 AM and redose prn 2) Cefepime 2g IV x 1 already given in ED also at 1532, start cefepime 1g IV q24 thereafter  Height: 5' 4.5" (163.8 cm) Weight: 53 kg (116 lb 13.5 oz) IBW/kg (Calculated) : 55.85  Temp (24hrs), Avg:93.9 F (34.4 C), Min:93.2 F (34 C), Max:95.2 F (35.1 C)  Recent Labs  Lab 08/29/20 1458  WBC 25.8*  CREATININE 8.02*  LATICACIDVEN 3.8*    Estimated Creatinine Clearance: 7 mL/min (A) (by C-G formula based on SCr of 8.02 mg/dL (H)).    Allergies  Allergen Reactions  . Amoxicillin Rash  . Bactrim [Sulfamethoxazole-Trimethoprim] Rash    Rash noted to chest and back  . Zithromax [Azithromycin] Rash    Thank you for allowing pharmacy to be a part of this patient's care.  Berkley Harvey 08/29/2020 6:16 PM

## 2020-08-29 NOTE — ED Notes (Signed)
ED TO INPATIENT HANDOFF REPORT  Name/Age/Gender Emily Park 50 y.o. female  Code Status    Code Status Orders  (From admission, onward)         Start     Ordered   08/29/20 1717  Full code  Continuous        08/29/20 1720        Code Status History    Date Active Date Inactive Code Status Order ID Comments User Context   11/13/2015 0358 11/14/2015 2047 Full Code [id number]  Edwin Dada, MD Inpatient   Advance Care Planning Activity      Home/SNF/Other Home  Chief Complaint Septic shock ([place]) [A41.9, R65.21]  Level of Care/Admitting Diagnosis ED Disposition    ED Disposition Condition [place]: [facility] [100102]  Level of Care: ICU [6]  May admit patient to [facility] or [facility] if equivalent level of care is available:: Yes  Covid Evaluation: Symptomatic Person Under Investigation (PUI)  Diagnosis: Septic shock [facility]) [id number]  Admitting Physician: Laurin Coder [id number]  Attending Physician: Laurin Coder [id number]  Estimated length of stay: 3 - 4 days  Certification:: I certify this patient will need inpatient services for at least 2 midnights       Medical History Past Medical History:  Diagnosis Date  . Abnormal TSH   . ADD (attention deficit disorder)   . Anemia   . Anxiety   . Arrhythmia   . Decreased libido   . Depression   . Heart murmur   . Hyperlipidemia   . Hypokalemia   . Migraine   . Pap smear abnormality of cervix/human papillomavirus (HPV) positive   . Sleep difficulties   . SVT (supraventricular tachycardia) (HCC)     Allergies Allergies  Allergen Reactions  . Amoxicillin Rash  . Bactrim [Sulfamethoxazole-Trimethoprim] Rash    Rash noted to chest and back  . Zithromax [Azithromycin] Rash    IV Location/Drains/Wounds Patient Lines/Drains/Airways Status    Active Line/Drains/Airways    Name Placement date Placement time Site Days   Peripheral IV  08/29/20 Right Antecubital 08/29/20  1452  Antecubital  less than 1   Peripheral IV 08/29/20 Left Antecubital 08/29/20  1541  Antecubital  less than 1   Urethral Catheter Janett Billow, RN Temperature probe 14 Fr. 08/29/20  1612  Temperature probe  less than 1          Labs/Imaging Results for orders placed or performed during the hospital encounter of 08/29/20 (from the past 48 hour(s))  CBG monitoring, ED     Status: Abnormal   Collection Time: 08/29/20  2:48 PM  Result Value Ref Range   Glucose-Capillary 107 (H) 70 - 99 mg/dL    Comment: Glucose reference range applies only to samples taken after fasting for at least 8 hours.  Ethanol     Status: None   Collection Time: 08/29/20  2:53 PM  Result Value Ref Range   Alcohol, Ethyl (B) <10 <10 mg/dL    Comment: (NOTE) Lowest detectable limit for serum alcohol is 10 mg/dL.  For medical purposes only. Performed at [facility], [place] [address], [place], [place] [address]   Lactic acid, plasma     Status: Abnormal   Collection Time: 08/29/20  2:58 PM  Result Value Ref Range   Lactic Acid, Venous 3.8 (HH) 0.5 - 1.9 mmol/L    Comment: CRITICAL RESULT CALLED TO, READ BACK BY AND VERIFIED  WITH: M.BOWEN,RN [id number] @1600  BY V.WILKINS Performed at [facility], [place] [address], [place], [place] [address]   Comprehensive metabolic panel     Status: Abnormal   Collection Time: 08/29/20  2:58 PM  Result Value Ref Range   Sodium 132 (L) 135 - 145 mmol/L   Potassium 3.9 3.5 - 5.1 mmol/L   Chloride 97 (L) 98 - 111 mmol/L   CO2 11 (L) 22 - 32 mmol/L   Glucose, Bld 137 (H) 70 - 99 mg/dL    Comment: Glucose reference range applies only to samples taken after fasting for at least 8 hours.   BUN 80 (H) 6 - 20 mg/dL   Creatinine, Ser 8.02 (H) 0.44 - 1.00 mg/dL   Calcium 8.2 (L) 8.9 - 10.3 mg/dL   Total Protein 8.8 (H) 6.5 - 8.1 g/dL   Albumin 3.7 3.5 - 5.0 g/dL   AST 19 15 - 41 U/L   ALT 19 0 - 44 U/L    Alkaline Phosphatase 73 38 - 126 U/L   Total Bilirubin 0.4 0.3 - 1.2 mg/dL   GFR, Estimated 6 (L) >60 mL/min    Comment: (NOTE) Calculated using the CKD-EPI Creatinine Equation (2021)    Anion gap 24 (H) 5 - 15    Comment: Performed at [facility], [place] [address], [place], [place] [address]  CBC WITH DIFFERENTIAL     Status: Abnormal   Collection Time: 08/29/20  2:58 PM  Result Value Ref Range   WBC 25.8 (H) 4.0 - 10.5 K/uL   RBC 3.85 (L) 3.87 - 5.11 MIL/uL   Hemoglobin 9.8 (L) 12.0 - 15.0 g/dL   HCT 32.6 (L) 36.0 - 46.0 %   MCV 84.7 80.0 - 100.0 fL   MCH 25.5 (L) 26.0 - 34.0 pg   MCHC 30.1 30.0 - 36.0 g/dL   RDW 15.4 11.5 - 15.5 %   Platelets 717 (H) 150 - 400 K/uL   nRBC 0.0 0.0 - 0.2 %   Neutrophils Relative % 87 %   Neutro Abs 22.6 (H) 1.7 - 7.7 K/uL   Lymphocytes Relative 7 %   Lymphs Abs 1.7 0.7 - 4.0 K/uL   Monocytes Relative 5 %   Monocytes Absolute 1.2 (H) 0.1 - 1.0 K/uL   Eosinophils Relative 0 %   Eosinophils Absolute 0.0 0.0 - 0.5 K/uL   Basophils Relative 0 %   Basophils Absolute 0.1 0.0 - 0.1 K/uL   Immature Granulocytes 1 %   Abs Immature Granulocytes 0.23 (H) 0.00 - 0.07 K/uL   Ovalocytes PRESENT     Comment: Performed at [facility], [place] [address] [place], [place] [address]  Protime-INR     Status: None   Collection Time: 08/29/20  2:58 PM  Result Value Ref Range   Prothrombin Time 14.4 11.4 - 15.2 seconds   INR 1.2 0.8 - 1.2    Comment: (NOTE) INR goal varies based on device and disease states. Performed at [facility], [place] [address], [place], [place] [address]   APTT     Status: None   Collection Time: 08/29/20  2:58 PM  Result Value Ref Range   aPTT 24 24 - 36 seconds    Comment: Performed at [facility], [place] [address] [place], [place] [address]  Resp Panel by RT-PCR (Flu A&B, Covid) Nasopharyngeal Swab     Status: None   Collection Time: 08/29/20  3:14 PM    Specimen:  Nasopharyngeal Swab; Nasopharyngeal(NP) swabs in vial transport medium  Result Value Ref Range   SARS Coronavirus 2 by RT PCR NEGATIVE NEGATIVE    Comment: (NOTE) SARS-CoV-2 target nucleic acids are NOT DETECTED.  The SARS-CoV-2 RNA is generally detectable in upper respiratory specimens during the acute phase of infection. The lowest concentration of SARS-CoV-2 viral copies this assay can detect is 138 copies/mL. A negative result does not preclude SARS-Cov-2 infection and should not be used as the sole basis for treatment or other patient management decisions. A negative result may occur with  improper specimen collection/handling, submission of specimen other than nasopharyngeal swab, presence of viral mutation(s) within the areas targeted by this assay, and inadequate number of viral copies(<138 copies/mL). A negative result must be combined with clinical observations, patient history, and epidemiological information. The expected result is Negative.  Fact Sheet for Patients:  EntrepreneurPulse.com.au  Fact Sheet for Healthcare Providers:  IncredibleEmployment.be  This test is no t yet approved or cleared by the [place] FDA and  has been authorized for detection and/or diagnosis of SARS-CoV-2 by FDA under an Emergency Use Authorization (EUA). This EUA will remain  in effect (meaning this test can be used) for the duration of the COVID-19 declaration under Section 564(b)(1) of the Act, 21 U.S.C.section 360bbb-3(b)(1), unless the authorization is terminated  or revoked sooner.       Influenza A by PCR NEGATIVE NEGATIVE   Influenza B by PCR NEGATIVE NEGATIVE    Comment: (NOTE) The Xpert Xpress SARS-CoV-2/FLU/RSV plus assay is intended as an aid in the diagnosis of influenza from Nasopharyngeal swab specimens and should not be used as a sole basis for treatment. Nasal washings and aspirates are unacceptable for Xpert Xpress  SARS-CoV-2/FLU/RSV testing.  Fact Sheet for Patients: EntrepreneurPulse.com.au  Fact Sheet for Healthcare Providers: IncredibleEmployment.be  This test is not yet approved or cleared by the [place] FDA and has been authorized for detection and/or diagnosis of SARS-CoV-2 by FDA under an Emergency Use Authorization (EUA). This EUA will remain in effect (meaning this test can be used) for the duration of the COVID-19 declaration under Section 564(b)(1) of the Act, 21 U.S.C. section 360bbb-3(b)(1), unless the authorization is terminated or revoked.  Performed at [facility], [place] [address] [place], [place] [address]   Lactic acid, plasma     Status: Abnormal   Collection Time: 08/29/20  6:21 PM  Result Value Ref Range   Lactic Acid, Venous 4.8 (HH) 0.5 - 1.9 mmol/L    Comment: CRITICAL RESULT CALLED TO, READ BACK BY AND VERIFIED WITH: NANA VITA RN 08/29/20 @1924  BY P.HENDERSON Performed at [facility], [place] [address], [place], [place] [address]   Rapid urine drug screen (hospital performed)     Status: Abnormal   Collection Time: 08/29/20  6:21 PM  Result Value Ref Range   Opiates NONE DETECTED NONE DETECTED   Cocaine NONE DETECTED NONE DETECTED   Benzodiazepines POSITIVE (A) NONE DETECTED   Amphetamines NONE DETECTED NONE DETECTED   Tetrahydrocannabinol NONE DETECTED NONE DETECTED   Barbiturates NONE DETECTED NONE DETECTED    Comment: (NOTE) DRUG SCREEN FOR MEDICAL PURPOSES ONLY.  IF CONFIRMATION IS NEEDED FOR ANY PURPOSE, NOTIFY LAB WITHIN 5 DAYS.  LOWEST DETECTABLE LIMITS FOR URINE DRUG SCREEN Drug Class                     Cutoff (ng/mL) Amphetamine and metabolites    1000 Barbiturate and metabolites  200 Benzodiazepine                 [id number] Tricyclics and metabolites     300 Opiates and metabolites        300 Cocaine and metabolites        300 THC                             50 Performed at [facility], [place] [address], [place], [place] [address]   CBC     Status: Abnormal   Collection Time: 08/29/20  6:21 PM  Result Value Ref Range   WBC 17.2 (H) 4.0 - 10.5 K/uL   RBC 2.46 (L) 3.87 - 5.11 MIL/uL   Hemoglobin 6.3 (LL) 12.0 - 15.0 g/dL    Comment: REPEATED TO VERIFY THIS CRITICAL RESULT HAS VERIFIED AND BEEN CALLED TO BOWEN,MIKE,RN BY TURNER,SHAWANA ON 01 02 2022 AT 1846, AND HAS BEEN READ BACK.     HCT 21.2 (L) 36.0 - 46.0 %   MCV 86.2 80.0 - 100.0 fL   MCH 25.6 (L) 26.0 - 34.0 pg   MCHC 29.7 (L) 30.0 - 36.0 g/dL   RDW 15.3 11.5 - 15.5 %   Platelets 401 (H) 150 - 400 K/uL   nRBC 0.0 0.0 - 0.2 %    Comment: Performed at [facility], [place] [address] [place], [place] [address]  Creatinine, serum     Status: Abnormal   Collection Time: 08/29/20  6:21 PM  Result Value Ref Range   Creatinine, Ser 6.32 (H) 0.44 - 1.00 mg/dL   GFR, Estimated 8 (L) >60 mL/min    Comment: (NOTE) Calculated using the CKD-EPI Creatinine Equation (2021) Performed at [facility], [place] [address], [place], [place] [address]   Procalcitonin     Status: None   Collection Time: 08/29/20  6:21 PM  Result Value Ref Range   Procalcitonin 0.12 ng/mL    Comment:        Interpretation: PCT (Procalcitonin) <= 0.5 ng/mL: Systemic infection (sepsis) is not likely. Local bacterial infection is possible. (NOTE)       Sepsis PCT Algorithm           Lower Respiratory Tract                                      Infection PCT Algorithm    ----------------------------     ----------------------------         PCT < 0.25 ng/mL                PCT < 0.10 ng/mL          Strongly encourage             Strongly discourage   discontinuation of antibiotics    initiation of antibiotics    ----------------------------     -----------------------------       PCT 0.25 - 0.50 ng/mL            PCT 0.10 - 0.25 ng/mL               OR       >80%  decrease in PCT            Discourage initiation of  antibiotics      Encourage discontinuation           of antibiotics    ----------------------------     -----------------------------         PCT >= 0.50 ng/mL              PCT 0.26 - 0.50 ng/mL               AND        <80% decrease in PCT             Encourage initiation of                                             antibiotics       Encourage continuation           of antibiotics    ----------------------------     -----------------------------        PCT >= 0.50 ng/mL                  PCT > 0.50 ng/mL               AND         increase in PCT                  Strongly encourage                                      initiation of antibiotics    Strongly encourage escalation           of antibiotics                                     -----------------------------                                           PCT <= 0.25 ng/mL                                                 OR                                        > 80% decrease in PCT                                      Discontinue / Do not initiate                                             antibiotics  Performed at [facility], [address], [place], [place] [address]   Magnesium     Status: Abnormal   Collection Time: 08/29/20  6:21 PM  Result Value Ref Range   Magnesium 1.3 (L) 1.7 - 2.4 mg/dL    Comment: Performed at [facility], [address] [place], [place] [address]  Phosphorus     Status: Abnormal   Collection Time: 08/29/20  6:21 PM  Result Value Ref Range   Phosphorus 6.8 (H) 2.5 - 4.6 mg/dL    Comment: Performed at [facility], [address] [place], [place] [address]  Urinalysis, Routine w reflex microscopic Urine, Catheterized     Status: Abnormal   Collection Time: 08/29/20  6:21 PM  Result Value Ref Range   Color, Urine YELLOW YELLOW   APPearance  CLOUDY (A) CLEAR   Specific Gravity, Urine 1.016 1.005 - 1.030   pH 5.0 5.0 - 8.0   Glucose, UA NEGATIVE NEGATIVE mg/dL   Hgb urine dipstick NEGATIVE NEGATIVE   Bilirubin Urine NEGATIVE NEGATIVE   Ketones, ur NEGATIVE NEGATIVE mg/dL   Protein, ur 295 (A) NEGATIVE mg/dL   Nitrite NEGATIVE NEGATIVE   Leukocytes,Ua SMALL (A) NEGATIVE   RBC / HPF 0-5 0 - 5 RBC/hpf   WBC, UA 21-50 0 - 5 WBC/hpf   Bacteria, UA RARE (A) NONE SEEN   Squamous Epithelial / LPF 0-5 0 - 5   WBC Clumps PRESENT    Mucus PRESENT    Hyaline Casts, UA PRESENT     Comment: Performed at [facility], [address] [place], [place] [address]  ABO/Rh     Status: None   Collection Time: 08/29/20  6:21 PM  Result Value Ref Range   ABO/RH(D)      O POS Performed at [facility], [address] [place], [place] [address]   Prepare RBC (crossmatch)     Status: None   Collection Time: 08/29/20  7:07 PM  Result Value Ref Range   Order Confirmation      ORDER PROCESSED BY BLOOD BANK Performed at [facility], [address] [place], [place] [address]   Hemoglobin and hematocrit, blood     Status: Abnormal   Collection Time: 08/29/20  7:08 PM  Result Value Ref Range   Hemoglobin 6.3 (LL) 12.0 - 15.0 g/dL    Comment: REPEATED TO VERIFY CRITICAL VALUE NOTED.  VALUE IS CONSISTENT WITH PREVIOUSLY REPORTED AND CALLED VALUE.    HCT 21.2 (L) 36.0 - 46.0 %    Comment: Performed at [facility], [address] [place], [place] [address]  Type and screen [facility]     Status: None (Preliminary result)   Collection Time: 08/29/20  7:20 PM  Result Value Ref Range   ABO/RH(D) O POS    Antibody Screen NEG    Sample Expiration 09/01/2020,2359    Unit Number [id number]    Blood Component Type RED CELLS,LR    Unit division 00    Status of Unit ALLOCATED    Transfusion Status OK TO TRANSFUSE    Crossmatch Result       Compatible Performed at [facility], [address], [place], [place] [address]    Unit Number [id number]    Blood Component Type RED CELLS,LR    Unit division 00    Status of Unit ALLOCATED    Transfusion Status OK TO TRANSFUSE    Crossmatch Result Compatible    DG Chest Port 1 View  Result Date: 08/29/2020 CLINICAL DATA:  Sepsis EXAM: PORTABLE CHEST 1 VIEW COMPARISON:  11/12/2015 FINDINGS: Heart and mediastinal contours are within normal limits. No focal opacities or effusions. No acute bony abnormality. IMPRESSION: Negative. Electronically Signed   By: Rolm Baptise M.D.   On: 08/29/2020 15:32    Pending Labs Unresulted Labs (From admission, onward)          Start     Ordered   08/30/20 0500  CBC  Tomorrow morning,   R        08/29/20 1720   08/30/20 [id number]  Basic metabolic panel  Tomorrow morning,   R        08/29/20 1720   08/29/20 [id number]  Basic metabolic panel  Once,   STAT        08/29/20 1720   08/29/20 1907  Type and screen [facility]  Once,   STAT       Comments: [place]    08/29/20 1906   08/29/20 1801  Gastrointestinal Panel by PCR , Stool  (Gastrointestinal Panel by PCR, Stool                                                                                                                                                     *Does Not include CLOSTRIDIUM DIFFICILE testing.**If CDIFF testing is needed, select the C Difficile Quick Screen (NO PCR Reflex) order below)  Once,   STAT        08/29/20 1800   08/29/20 1717  HIV Antibody (routine testing w rflx)  (HIV Antibody (Routine testing w reflex) panel)  Once,   STAT        08/29/20 1720   08/29/20 1458  Blood Culture (routine x 2)  (Septic presentation on arrival (screening labs, nursing and treatment orders for obvious sepsis))  BLOOD CULTURE X 2,   STAT      08/29/20 1458   08/29/20 1458  Urinalysis, Routine w reflex microscopic Urine, Catheterized  (Septic  presentation on arrival (screening labs, nursing and treatment orders for obvious sepsis))  ONCE - STAT,   STAT        08/29/20 1458   08/29/20 1458  Urine culture  (Septic presentation on arrival (screening labs, nursing and treatment orders for obvious sepsis))  ONCE - STAT,   STAT        08/29/20 1458          Vitals/Pain Today's Vitals   08/29/20 2115 08/29/20 2130 08/29/20 2145 08/29/20 2200  BP: 101/66 (!) 112/59 119/64 112/74  Pulse: 95 96 95 93  Resp: 20 18 (!) 23 16  Temp: 98.7 F (37.1 C) 98.8 F (37.1 C) 98.9 F (37.2 C) 98.9 F (37.2 C)  TempSrc:      SpO2: 98% 95% 98% 97%  Weight:      Height:      PainSc:  Isolation Precautions Enteric precautions (UV disinfection)  Medications Medications  lactated ringers infusion ( Intravenous New Bag/Given 08/29/20 1600)  lactated ringers infusion ( Intravenous New Bag/Given 08/29/20 1600)  docusate sodium (COLACE) capsule 100 mg (has no administration in time range)  polyethylene glycol (MIRALAX / GLYCOLAX) packet 17 g (has no administration in time range)  heparin injection 5,000 Units (has no administration in time range)  lactated ringers infusion ( Intravenous New Bag/Given 08/29/20 1743)  0.9 %  sodium chloride infusion (250 mLs Intravenous New Bag/Given (Non-Interop) 08/29/20 1743)  phenylephrine (NEOSYNEPHRINE) 10-0.9 MG/250ML-% infusion (165 mcg/min Intravenous New Bag/Given 08/29/20 2105)  metroNIDAZOLE (FLAGYL) tablet 500 mg (has no administration in time range)  vancomycin variable dose per unstable renal function (pharmacist dosing) (has no administration in time range)  ceFEPIme (MAXIPIME) 1 g in sodium chloride 0.9 % 100 mL IVPB (has no administration in time range)  0.9 %  sodium chloride infusion (Manually program via Guardrails IV Fluids) (0 mLs Intravenous Hold 08/29/20 1935)  clonazepam (KLONOPIN) disintegrating tablet 0.25 mg (has no administration in time range)  vancomycin (VANCOCIN) IVPB 1000 mg/200 mL  premix (0 mg Intravenous Stopped 08/29/20 1701)  lactated ringers bolus 1,000 mL (0 mLs Intravenous Stopped 08/29/20 1701)    And  lactated ringers bolus 500 mL (0 mLs Intravenous Stopped 08/29/20 1701)    And  lactated ringers bolus 250 mL (0 mLs Intravenous Stopped 08/29/20 1701)  metroNIDAZOLE (FLAGYL) IVPB 500 mg (0 mg Intravenous Stopped 08/29/20 1702)  ceFEPIme (MAXIPIME) 2 g in sodium chloride 0.9 % 100 mL IVPB (2 g Intravenous Bolus 08/29/20 1532)  lactated ringers bolus 1,000 mL (0 mLs Intravenous Stopped 08/29/20 1811)  lactated ringers bolus 1,000 mL (0 mLs Intravenous Stopped 08/29/20 2209)    Mobility walks

## 2020-08-29 NOTE — Progress Notes (Addendum)
eLink Physician-Brief Progress Note Patient Name: Emily Park DOB: 1971/08/04 MRN: [id number]   Date of Service  08/29/2020  HPI/Events of Note  50 year old woman was seen by PCCM and has now arrived to ICU with septic shock, dehydration, AKI and nausea and vomiting. No fevers. I am told she got more than 5 liter IV fluid boluses and is on Phenyle[hrine infusion via a peripheral line. At this time, she feels much better and has no pain anywhere, just feels tired and weak. She has been ordered for 2 units RBC and does not report any rectal bleeding or bloody vomitus, or bleeding from anywhere. On room air, O2 sat is in high 90s, HR in 80s, MAP is 78 on 160 mic pf Neosynephrine.   eICU Interventions  I discontinued multiple orders for LR, Heparin [place] (while with severe anemia) and laxative (while with diarrhea) 2 units RBC being given, ordered 5 am CBC Check BMP and lactate at 1 am - replete K if still low Mag is 1.3, replete mag now Continue pressors  Anticipate we will be able to reduce pressors and wean off If not, will need a CVC Hold heparin, SCD for now Patient mentioned in the past she had some work up for [facility] but was not on meds, would get records in AM D./w RN and patient in detail Call us with labs      Intervention Category Major Interventions: Electrolyte abnormality - evaluation and management;Acute renal failure - evaluation and management;Other: Evaluation Type: New Patient Evaluation  Oretha Milch 08/29/2020, 11:03 PM   4:45 am No more nausea, so changing diet now from NPO to sips with meds Call me when labs result  6 am MAP in 90s, neosynephrine being weaned and otherwise very comfortable on camera Labs need to be followed (were not sent earlier due to issues with IV line ) If continues to need pressors, may need a CVC or picc.

## 2020-08-29 NOTE — ED Notes (Signed)
Date and time results received: 08/29/20 1559 (use smartphrase ".now" to insert current time)  Test: Lactic Critical Value: 3.8  Name of Provider Notified: Ray  Orders Received? Or Actions Taken?: Provider notified

## 2020-08-29 NOTE — Progress Notes (Signed)
A consult was received from an ED physician for vanc/cefepime per pharmacy dosing.  The patient's profile has been reviewed for ht/wt/allergies/indication/available labs.   A one time order has been placed for vanc 1g and cefepime 2g.  Further antibiotics/pharmacy consults should be ordered by admitting physician if indicated.                       Thank you, Berkley Harvey 08/29/2020  3:03 PM

## 2020-08-29 NOTE — ED Notes (Signed)
Pts temp now WDL. Bair hugger removed and fluids taken off fluid warmer. Warm blankets applied to pt for comfort.

## 2020-08-29 NOTE — H&P (Signed)
NAME:  Emily Park, MRN:  [id number], DOB:  04-08-1971, LOS: 0 ADMISSION DATE:  08/29/2020, CONSULTATION DATE:  08/29/2020 REFERRING MD:  Dr Rosalia Hammers, CHIEF COMPLAINT:  Septic shock   Brief History:  3-day history of not feeling well Nausea vomiting diarrhea Passed out today from feeling lightheaded  Has not had any fevers or chills  Does not remember the last time she passed urine  Vaccinated for Covid Son was exposed to Covid and tested positive  Never smoker  Past Medical History:   Past Medical History:  Diagnosis Date  . Abnormal TSH   . ADD (attention deficit disorder)   . Anemia   . Anxiety   . Arrhythmia   . Decreased libido   . Depression   . Heart murmur   . Hyperlipidemia   . Hypokalemia   . Migraine   . Pap smear abnormality of cervix/human papillomavirus (HPV) positive   . Sleep difficulties   . SVT (supraventricular tachycardia) (HCC)    Significant Hospital Events:  Hypotension despite fluid resuscitation  Consults:  Critical care  Procedures:  None  Significant Diagnostic Tests:  Chest x-ray -Reviewed by myself-no infiltrative process  Micro Data:  Coronavirus negative Negative influenza A and B  Antimicrobials:  Vancomycin 1/2>> Flagyl 1/2>> Cefepime 1/2>>  Interim History / Subjective:  Just feels generally fatigued with a headache where she hit her head  Objective   Blood pressure (!) 68/36, pulse 80, temperature (!) 93.7 F (34.3 C), resp. rate 18, height 5' 4.5" (1.638 m), weight 53 kg, SpO2 100 %.        Intake/Output Summary (Last 24 hours) at 08/29/2020 1723 Last data filed at 08/29/2020 1702 Gross per 24 hour  Intake 2050 ml  Output --  Net 2050 ml   Filed Weights   08/29/20 1450  Weight: 53 kg    Examination: General: Middle-aged lady, does not appear to be in distress HENT: Moist oral mucosa Lungs: Clear breath sounds bilaterally Cardiovascular: S1-S2 appreciated Abdomen: Soft, nontender, bowel sounds  appreciated Extremities: No clubbing, no edema Neuro: Alert and oriented GU:   Resolved Hospital Problem list     Assessment & Plan:  Septic shock -Patient has received 30 cc per kg and is still hypotensive -Elevated lactate -Blood cultures drawn -Request for GI panel -Chest x-ray with no acute infiltrate, patient denies respiratory complaints -Did not have any symptoms of urinary tract infection -Continue empiric antibiotics at the present time  Acute kidney injury Oliguria -Continue fluid resuscitation -Start on pressors  Source of infection is unclear at the present time -Continue broad spectrum antibiotics  History of ADHD Chronic migraine Paroxysmal supraventricular tachycardia  CT from 2017 with lung nodules -Will need follow-up as outpatient  Will need med reconciliation when more stable-to resume home meds  Best practice (evaluated daily)  Diet: As tolerated Pain/Anxiety/Delirium protocol (if indicated): Tylenol VAP protocol (if indicated): Not indicated DVT prophylaxis: Heparin, SCD GI prophylaxis:  Glucose control: Will monitor Mobility: Bedrest Disposition: ICU  Goals of Care:  Last date of multidisciplinary goals of care discussion: Pending Family and staff present: no Summary of discussion: Pending Follow up goals of care discussion due: Pending Code Status: Full code  Labs   CBC: Recent Labs  Lab 08/29/20 1458  WBC 25.8*  NEUTROABS 22.6*  HGB 9.8*  HCT 32.6*  MCV 84.7  PLT 717*    Basic Metabolic Panel: Recent Labs  Lab 08/29/20 1458  NA 132*  K 3.9  CL 97*  CO2 11*  GLUCOSE 137*  BUN 80*  CREATININE 8.02*  CALCIUM 8.2*   GFR: Estimated Creatinine Clearance: 7 mL/min (A) (by C-G formula based on SCr of 8.02 mg/dL (H)). Recent Labs  Lab 08/29/20 1458  WBC 25.8*  LATICACIDVEN 3.8*    Liver Function Tests: Recent Labs  Lab 08/29/20 1458  AST 19  ALT 19  ALKPHOS 73  BILITOT 0.4  PROT 8.8*  ALBUMIN 3.7   No  results for input(s): LIPASE, AMYLASE in the last 168 hours. No results for input(s): AMMONIA in the last 168 hours.  ABG    Component Value Date/Time   TCO2 21 05/20/2009 0335     Coagulation Profile: Recent Labs  Lab 08/29/20 1458  INR 1.2    Cardiac Enzymes: No results for input(s): CKTOTAL, CKMB, CKMBINDEX, TROPONINI in the last 168 hours.  HbA1C: No results found for: HGBA1C  CBG: Recent Labs  Lab 08/29/20 1448  GLUCAP 107*    Review of Systems:   Diarrhea nausea vomiting Past Medical History:  She,  has a past medical history of Abnormal TSH, ADD (attention deficit disorder), Anemia, Anxiety, Arrhythmia, Decreased libido, Depression, Heart murmur, Hyperlipidemia, Hypokalemia, Migraine, Pap smear abnormality of cervix/human papillomavirus (HPV) positive, Sleep difficulties, and SVT (supraventricular tachycardia) ([place]).   Surgical History:   Past Surgical History:  Procedure Laterality Date  . ABLATION     Cardiac ablation, endometrial ablation  . BREAST LUMPECTOMY     right breast  . BREAST LUMPECTOMY Right   . CARDIOVERSION    . COLPOSCOPY     w/ cervical biopsy  . DILITATION & CURRETTAGE/HYSTROSCOPY WITH NOVASURE ABLATION N/A 09/11/2014   Procedure: DILATATION & CURETTAGE/HYSTEROSCOPY WITH NOVASURE ABLATION;  Surgeon: Lovenia Kim, MD;  Location: [facility];  Service: Gynecology;  Laterality: N/A;     Social History:   reports that she has never smoked. She has never used smokeless tobacco. She reports current alcohol use. She reports previous drug use.   Family History:  Her family history includes AAA (abdominal aortic aneurysm) in her father; Breast cancer in her paternal grandmother; Cancer in her mother; Depression in her mother; Diabetes in her maternal grandfather; Heart attack in her father; Hypertension in her father; Hyperthyroidism in her sister; Hypothyroidism in her mother and sister.   Allergies Allergies  Allergen Reactions  .  Amoxicillin Rash  . Bactrim [Sulfamethoxazole-Trimethoprim] Rash    Rash noted to chest and back  . Zithromax [Azithromycin] Rash     Home Medications  Prior to Admission medications   Medication Sig Start Date End Date Taking? Authorizing Provider  amphetamine-dextroamphetamine (ADDERALL XR) 30 MG 24 hr capsule Take 30 mg by mouth daily as needed.    [provider]  BIOTIN PO Take 1 tablet by mouth daily.    [provider]  buPROPion HCl (WELLBUTRIN XL PO) Take 200 mg by mouth daily.    [provider]  clonazePAM (KLONOPIN) 0.5 MG tablet Take 0.25 mg by mouth 2 (two) times daily as needed for anxiety.    [provider]  diphenhydrAMINE HCl (BENADRYL ALLERGY PO) Take 1 tablet by mouth as needed.    [provider]  Erenumab-aooe (AIMOVIG) 70 MG/ML SOAJ Inject 70 mg into the skin every 30 (thirty) days. 02/27/19   Marcial Pacas, MD  FLUoxetine (PROZAC) 40 MG capsule Take 40 mg by mouth daily.    [provider]  HYDROcodone-ibuprofen (VICOPROFEN) 7.5-200 MG tablet Take 1 tablet by mouth every 4 (four)  hours as needed for moderate pain.    [provider]  Multiple Vitamin (MULTIVITAMIN PO) Take 1 tablet by mouth daily.    [provider]  ondansetron (ZOFRAN ODT) 4 MG disintegrating tablet Take 1 tablet (4 mg total) by mouth every 8 (eight) hours as needed. 02/27/19   Levert Feinstein, MD  rizatriptan (MAXALT-MLT) 10 MG disintegrating tablet Take 1 tablet (10 mg total) by mouth as needed. May repeat in 2 hours if needed 02/27/19   Levert Feinstein, MD  tiZANidine (ZANAFLEX) 4 MG tablet Take 1 tablet (4 mg total) by mouth every 6 (six) hours as needed for muscle spasms. 02/27/19   Levert Feinstein, MD  topiramate (TOPAMAX) 100 MG tablet Take 1.5 tablets (150 mg total) by mouth 2 (two) times daily. 02/27/19   Levert Feinstein, MD    The patient is critically ill with multiple organ systems failure and requires high complexity decision making for assessment  and support, frequent evaluation and titration of therapies, application of advanced monitoring technologies and extensive interpretation of multiple databases. Critical Care Time devoted to patient care services described in this note independent of APP/resident time (if applicable)  is 30 minutes.   Virl Diamond MD [facility] Personal pager: [phone] If unanswered, please page CCM On-call: #[phone]

## 2020-08-29 NOTE — ED Notes (Signed)
Date and time results received: 08/29/20 1849 (use smartphrase ".now" to insert current time)  Test: HGB Critical Value: 6.3  Name of Provider Notified: Nanavati  Orders Received? Or Actions Taken?: Provider notified

## 2020-08-29 NOTE — ED Provider Notes (Signed)
[facility] DEPT Provider Note   CSN: [id number] Arrival date & time: 08/29/20  1443     History Chief Complaint  Patient presents with  . Loss of Consciousness    Emily Park is a 50 y.o. female.  HPI  Level 5 caveat due to hypotension and urgent need for intervention 50 yo female history of ADD, anemia, depression, migraines presents today with passing out and hypotension.  Patient reports 3 days of nausea, vomiting, and diarrhea with assoiciated lightheadedness and syncope.  She has struck her head and has other bruises but denies severe headache or lateralized symptoms.  She has had some stomach problems for years, but worsening now.  She denies fever, chills, or rectal bleeding.  She reports no change in meds.  She has had decreased appetite, indigestion, no abdominal sugeries. Occasional etoh, no illicit drugs, not smoker. Covid immunizations and booster completed last week. Covid exposure to son several days ago after he had exposure   Past Medical History:  Diagnosis Date  . Abnormal TSH   . ADD (attention deficit disorder)   . Anemia   . Anxiety   . Arrhythmia   . Decreased libido   . Depression   . Heart murmur   . Hyperlipidemia   . Hypokalemia   . Migraine   . Pap smear abnormality of cervix/human papillomavirus (HPV) positive   . Sleep difficulties   . SVT (supraventricular tachycardia) [facility])     Patient Active Problem List   Diagnosis Date Noted  . Chronic migraine 02/27/2019  . Lung nodules 11/14/2015  . Paroxysmal SVT (supraventricular tachycardia) ([place]) 11/13/2015  . Attention deficit hyperactivity disorder (ADHD) 11/13/2015    Past Surgical History:  Procedure Laterality Date  . ABLATION     Cardiac ablation, endometrial ablation  . BREAST LUMPECTOMY     right breast  . BREAST LUMPECTOMY Right   . CARDIOVERSION    . COLPOSCOPY     w/ cervical biopsy  . DILITATION & CURRETTAGE/HYSTROSCOPY WITH NOVASURE  ABLATION N/A 09/11/2014   Procedure: DILATATION & CURETTAGE/HYSTEROSCOPY WITH NOVASURE ABLATION;  Surgeon: Lovenia Kim, MD;  Location: [facility];  Service: Gynecology;  Laterality: N/A;     OB History   No obstetric history on file.     Family History  Problem Relation Age of Onset  . Hypothyroidism Mother   . Depression Mother   . Cancer Mother        thyroid  . Hypertension Father   . Heart attack Father        Age 25  . AAA (abdominal aortic aneurysm) Father   . Hypothyroidism Sister   . Hyperthyroidism Sister   . Diabetes Maternal Grandfather   . Breast cancer Paternal Grandmother     Social History   Tobacco Use  . Smoking status: Never Smoker  . Smokeless tobacco: Never Used  Substance Use Topics  . Alcohol use: Yes    Comment: occasional wine  . Drug use: Not Currently    Home Medications Prior to Admission medications   Medication Sig Start Date End Date Taking? Authorizing Provider  amphetamine-dextroamphetamine (ADDERALL XR) 30 MG 24 hr capsule Take 30 mg by mouth daily as needed.    [provider]  BIOTIN PO Take 1 tablet by mouth daily.    [provider]  buPROPion HCl (WELLBUTRIN XL PO) Take 200 mg by mouth daily.    [provider]  clonazePAM (KLONOPIN) 0.5 MG tablet Take 0.25 mg  by mouth 2 (two) times daily as needed for anxiety.    [provider]  diphenhydrAMINE HCl (BENADRYL ALLERGY PO) Take 1 tablet by mouth as needed.    [provider]  Erenumab-aooe (AIMOVIG) 70 MG/ML SOAJ Inject 70 mg into the skin every 30 (thirty) days. 02/27/19   Marcial Pacas, MD  FLUoxetine (PROZAC) 40 MG capsule Take 40 mg by mouth daily.    [provider]  HYDROcodone-ibuprofen (VICOPROFEN) 7.5-200 MG tablet Take 1 tablet by mouth every 4 (four) hours as needed for moderate pain.    [provider]  Multiple Vitamin (MULTIVITAMIN PO) Take 1 tablet by mouth daily.    [provider]  ondansetron  (ZOFRAN ODT) 4 MG disintegrating tablet Take 1 tablet (4 mg total) by mouth every 8 (eight) hours as needed. 02/27/19   Marcial Pacas, MD  rizatriptan (MAXALT-MLT) 10 MG disintegrating tablet Take 1 tablet (10 mg total) by mouth as needed. May repeat in 2 hours if needed 02/27/19   Marcial Pacas, MD  tiZANidine (ZANAFLEX) 4 MG tablet Take 1 tablet (4 mg total) by mouth every 6 (six) hours as needed for muscle spasms. 02/27/19   Marcial Pacas, MD  topiramate (TOPAMAX) 100 MG tablet Take 1.5 tablets (150 mg total) by mouth 2 (two) times daily. 02/27/19   Marcial Pacas, MD    Allergies    Amoxicillin, Bactrim [sulfamethoxazole-trimethoprim], and Zithromax [azithromycin]  Review of Systems   Review of Systems  All other systems reviewed and are negative.   Physical Exam Updated Vital Signs BP (!) 54/35 (BP Location: Right Arm)   Pulse 92   Resp 18   Ht 1.638 m (5' 4.5")   Wt 53 kg   SpO2 98%   BMI 19.75 kg/m   Physical Exam Vitals and nursing note reviewed.  Constitutional:      Appearance: Normal appearance.  HENT:     Head: Normocephalic and atraumatic.     Right Ear: External ear normal.     Left Ear: External ear normal.     Nose: Nose normal.     Mouth/Throat:     Mouth: Mucous membranes are dry.  Eyes:     Extraocular Movements: Extraocular movements intact.     Pupils: Pupils are equal, round, and reactive to light.  Cardiovascular:     Rate and Rhythm: Normal rate and regular rhythm.  Pulmonary:     Effort: Pulmonary effort is normal.     Breath sounds: Normal breath sounds.  Abdominal:     General: Abdomen is flat.     Palpations: Abdomen is soft.  Musculoskeletal:        General: Normal range of motion.     Cervical back: Normal range of motion.  Skin:    General: Skin is warm.     Capillary Refill: Capillary refill takes less than 2 seconds.     Findings: Bruising present.  Neurological:     General: No focal deficit present.     Mental Status: She is alert.  Psychiatric:         Mood and Affect: Mood normal.     ED Results / Procedures / Treatments   Labs (all labs ordered are listed, but only abnormal results are displayed) Labs Reviewed  CBG MONITORING, ED - Abnormal; Notable for the following components:      Result Value   Glucose-Capillary 107 (*)    All other components within normal limits  CULTURE, BLOOD (ROUTINE X 2)  CULTURE, BLOOD (ROUTINE X 2)  URINE CULTURE  LACTIC ACID, PLASMA  LACTIC ACID, PLASMA  COMPREHENSIVE METABOLIC PANEL  CBC WITH DIFFERENTIAL/PLATELET  PROTIME-INR  APTT  URINALYSIS, ROUTINE W REFLEX MICROSCOPIC  RAPID URINE DRUG SCREEN, HOSP PERFORMED  ETHANOL  I-STAT BETA HCG BLOOD, ED ([facility], [facility], [facility] ONLY)    EKG EKG Interpretation  Date/Time:  "Sunday August 29 2020 14:45:04 EST Ventricular Rate:  95 PR Interval:    QRS Duration: 99 QT Interval:  440 QTC Calculation: 554 R Axis:   42 Text Interpretation: Sinus rhythm Borderline short PR interval Prolonged QT interval Confirmed by ,  ([id number]) on 08/29/2020 3:30:14 PM   Radiology DG Chest Port 1 View  Result Date: 08/29/2020 CLINICAL DATA:  Sepsis EXAM: PORTABLE CHEST 1 VIEW COMPARISON:  11/12/2015 FINDINGS: Heart and mediastinal contours are within normal limits. No focal opacities or effusions. No acute bony abnormality. IMPRESSION: Negative. Electronically Signed   By: Kevin  Dover M.D.   On: 08/29/2020 15:32    Procedures .Critical Care Performed by: , , MD Authorized by: , , MD   Critical care provider statement:    Critical care time (minutes):  60   Critical care end time:  08/29/2020 5:51 PM   Critical care was time spent personally by me on the following activities:  Discussions with consultants, evaluation of patient's response to treatment, examination of patient, ordering and performing treatments and interventions, ordering and review of laboratory studies, ordering and review of radiographic studies, pulse oximetry,  re-evaluation of patient's condition, obtaining history from patient or surrogate and review of old charts   (including critical care time)  Medications Ordered in ED Medications  lactated ringers infusion (has no administration in time range)  vancomycin (VANCOCIN) IVPB 1000 mg/200 mL premix (has no administration in time range)  lactated ringers bolus 1,000 mL (has no administration in time range)    And  lactated ringers bolus 500 mL (has no administration in time range)    And  lactated ringers bolus 250 mL (has no administration in time range)  lactated ringers infusion (has no administration in time range)  metroNIDAZOLE (FLAGYL) IVPB 500 mg (has no administration in time range)  ceFEPIme (MAXIPIME) 2 g in sodium chloride 0.9 % 100 mL IVPB (has no administration in time range)    ED Course  I have reviewed the triage vital signs and the nursing notes.  Pertinent labs & imaging results that were available during my care of the patient were reviewed by me and considered in my medical decision making (see chart for details).  Clinical Course as of 08/29/20 1619  Sun Aug 29, 2020  1615 BP increased to 72/42 with fluid bolus infusin WBC 25,000 Creatinine 8 Potassium normal Patient with elevated lactic acid at 3.8  [DR]    Clinical Course User Index [DR] , , MD   MDM Rules/Calculators/A&P                          49"  yo previously health female presents with 3 days nausea vomiting and syncope DDX Sepsis- patient hypothermic, leukocytosis, hypotensive.  Treated with broad spectrum abx and fluid resuscitation ensuing Renal failure- patient has some history of recent GI problems with syncope, unclear when kidney function last tested Cardiac- ekg normal no chest pain Toxic- no history of substance abuse or ingestion   Final Clinical Impression(s) / ED Diagnoses Final diagnoses:  Hypotension, unspecified hypotension type  Acute renal failure, unspecified  acute  renal failure type [facility])    Rx / DC Orders ED Discharge Orders    None       Pattricia Boss, MD 08/29/20 [phone]

## 2020-08-29 NOTE — ED Notes (Signed)
Bair hugger applied. MD made aware of pts temp.

## 2020-08-29 NOTE — ED Triage Notes (Signed)
Patient has had multiple episodes of syncope for the past 3 days. BP in triage 55/38. Reports diarrhea, emesis, and headache.

## 2020-08-29 NOTE — Progress Notes (Signed)
Sepsis protocol is being monitored by eLink. 

## 2020-08-30 DIAGNOSIS — I9589 Other hypotension: Secondary | ICD-10-CM | POA: Diagnosis not present

## 2020-08-30 DIAGNOSIS — N170 Acute kidney failure with tubular necrosis: Secondary | ICD-10-CM | POA: Diagnosis not present

## 2020-08-30 DIAGNOSIS — E861 Hypovolemia: Secondary | ICD-10-CM

## 2020-08-30 DIAGNOSIS — N179 Acute kidney failure, unspecified: Secondary | ICD-10-CM | POA: Insufficient documentation

## 2020-08-30 DIAGNOSIS — I959 Hypotension, unspecified: Secondary | ICD-10-CM

## 2020-08-30 LAB — CBC WITH DIFFERENTIAL/PLATELET
Abs Immature Granulocytes: 0.06 10*3/uL (ref 0.00–0.07)
Basophils Absolute: 0.1 10*3/uL (ref 0.0–0.1)
Basophils Relative: 0 %
Eosinophils Absolute: 0.1 10*3/uL (ref 0.0–0.5)
Eosinophils Relative: 1 %
HCT: 32.1 % — ABNORMAL LOW (ref 36.0–46.0)
Hemoglobin: 10.4 g/dL — ABNORMAL LOW (ref 12.0–15.0)
Immature Granulocytes: 1 %
Lymphocytes Relative: 18 %
Lymphs Abs: 2.2 10*3/uL (ref 0.7–4.0)
MCH: 26.8 pg (ref 26.0–34.0)
MCHC: 32.4 g/dL (ref 30.0–36.0)
MCV: 82.7 fL (ref 80.0–100.0)
Monocytes Absolute: 1.2 10*3/uL — ABNORMAL HIGH (ref 0.1–1.0)
Monocytes Relative: 10 %
Neutro Abs: 8.6 10*3/uL — ABNORMAL HIGH (ref 1.7–7.7)
Neutrophils Relative %: 70 %
Platelets: 470 10*3/uL — ABNORMAL HIGH (ref 150–400)
RBC: 3.88 MIL/uL (ref 3.87–5.11)
RDW: 15.5 % (ref 11.5–15.5)
WBC: 12.3 10*3/uL — ABNORMAL HIGH (ref 4.0–10.5)
nRBC: 0 % (ref 0.0–0.2)

## 2020-08-30 LAB — FOLATE: Folate: 18.8 ng/mL (ref >5.9)

## 2020-08-30 LAB — LACTIC ACID, PLASMA: Lactic Acid, Venous: 1.9 mmol/L (ref 0.5–1.9)

## 2020-08-30 LAB — BASIC METABOLIC PANEL
Anion gap: 10 (ref 5–15)
Anion gap: 10 (ref 5–15)
BUN: 48 mg/dL — ABNORMAL HIGH (ref 6–20)
BUN: 31 mg/dL — ABNORMAL HIGH (ref 6–20)
CO2: 18 mmol/L — ABNORMAL LOW (ref 22–32)
CO2: 19 mmol/L — ABNORMAL LOW (ref 22–32)
Calcium: 7.6 mg/dL — ABNORMAL LOW (ref 8.9–10.3)
Calcium: 7.6 mg/dL — ABNORMAL LOW (ref 8.9–10.3)
Chloride: 115 mmol/L — ABNORMAL HIGH (ref 98–111)
Chloride: 112 mmol/L — ABNORMAL HIGH (ref 98–111)
Creatinine, Ser: 2.74 mg/dL — ABNORMAL HIGH (ref 0.44–1.00)
Creatinine, Ser: 1.73 mg/dL — ABNORMAL HIGH (ref 0.44–1.00)
GFR, Estimated: 20 mL/min — ABNORMAL LOW (ref >60)
GFR, Estimated: 36 mL/min — ABNORMAL LOW (ref >60)
Glucose, Bld: 81 mg/dL (ref 70–99)
Glucose, Bld: 113 mg/dL — ABNORMAL HIGH (ref 70–99)
Potassium: 2.8 mmol/L — ABNORMAL LOW (ref 3.5–5.1)
Potassium: 3.1 mmol/L — ABNORMAL LOW (ref 3.5–5.1)
Sodium: 143 mmol/L (ref 135–145)
Sodium: 141 mmol/L (ref 135–145)

## 2020-08-30 LAB — FERRITIN: Ferritin: 13 ng/mL (ref 11–307)

## 2020-08-30 LAB — RETICULOCYTES
Immature Retic Fract: 8.9 % (ref 2.3–15.9)
RBC.: 3.42 MIL/uL — ABNORMAL LOW (ref 3.87–5.11)
Retic Count, Absolute: 35.9 10*3/uL (ref 19.0–186.0)
Retic Ct Pct: 1.1 % (ref 0.4–3.1)

## 2020-08-30 LAB — IRON AND TIBC
Iron: 159 ug/dL (ref 28–170)
Saturation Ratios: 53 % — ABNORMAL HIGH (ref 10.4–31.8)
TIBC: 300 ug/dL (ref 250–450)
UIBC: 141 ug/dL

## 2020-08-30 LAB — PROCALCITONIN: Procalcitonin: <0.1 ng/mL

## 2020-08-30 LAB — CORTISOL: Cortisol, Plasma: 4.7 ug/dL

## 2020-08-30 LAB — VITAMIN B12: Vitamin B-12: 320 pg/mL (ref 180–914)

## 2020-08-30 MED ORDER — SODIUM CHLORIDE 0.9 % IV SOLN
2.0000 g | INTRAVENOUS | Status: DC
  Administered 2020-08-30 – 2020-08-31 (×2): 2 g via INTRAVENOUS
  Filled 2020-08-30 (×2): qty 2

## 2020-08-30 MED ORDER — DOXEPIN HCL 50 MG PO CAPS
75.0000 mg | ORAL_CAPSULE | Freq: Every day | ORAL | Status: DC
  Administered 2020-08-30 – 2020-09-01 (×3): 75 mg via ORAL
  Filled 2020-08-30 (×3): qty 1

## 2020-08-30 MED ORDER — POTASSIUM CHLORIDE CRYS ER 20 MEQ PO TBCR
40.0000 meq | EXTENDED_RELEASE_TABLET | Freq: Two times a day (BID) | ORAL | Status: AC
  Administered 2020-08-30 – 2020-08-31 (×3): 40 meq via ORAL
  Filled 2020-08-30 (×3): qty 2

## 2020-08-30 MED ORDER — FLUOXETINE HCL 20 MG PO CAPS
40.0000 mg | ORAL_CAPSULE | Freq: Every day | ORAL | Status: DC
  Administered 2020-08-30 – 2020-09-02 (×4): 40 mg via ORAL
  Filled 2020-08-30 (×4): qty 2

## 2020-08-30 MED ORDER — HYDROCORTISONE NA SUCCINATE PF 100 MG IJ SOLR
50.0000 mg | Freq: Four times a day (QID) | INTRAMUSCULAR | Status: DC
  Administered 2020-08-30 – 2020-09-01 (×8): 50 mg via INTRAVENOUS
  Filled 2020-08-30 (×8): qty 2

## 2020-08-30 MED ORDER — LACTATED RINGERS IV BOLUS
1000.0000 mL | Freq: Once | INTRAVENOUS | Status: AC
  Administered 2020-08-30: 1000 mL via INTRAVENOUS

## 2020-08-30 MED ORDER — PANTOPRAZOLE SODIUM 40 MG PO TBEC
40.0000 mg | DELAYED_RELEASE_TABLET | Freq: Every day | ORAL | Status: DC
  Administered 2020-08-30 – 2020-09-02 (×4): 40 mg via ORAL
  Filled 2020-08-30 (×4): qty 1

## 2020-08-30 MED ORDER — TOPIRAMATE 100 MG PO TABS
150.0000 mg | ORAL_TABLET | Freq: Two times a day (BID) | ORAL | Status: DC
  Administered 2020-08-30 – 2020-09-02 (×7): 150 mg via ORAL
  Filled 2020-08-30 (×7): qty 2

## 2020-08-30 NOTE — Progress Notes (Signed)
Pharmacy Antibiotic Note  Emily Park is a 50 y.o. female presented to the ED on 08/29/2020 with diarrhea, emesis and syncopy.  She was started on broad abx on admission for sepsis.  Today, 08/30/2020: - day #2 cefepime and flagyl; vancomycin d/ced by Dr. Judeth Horn on 1/3 - Tmax 99.3, wbc elevated but down - scr trending down (2.74, crcl~21) - all cultures have been negative   Plan: - adjust cefepime to 2gm IV q24h - flagyl 500mg  q8h - monitor renal function closely - f/u cultures  _______________________________________  Height: 5\' 4"  (162.6 cm) Weight: 55.6 kg (122 lb 9.2 oz) IBW/kg (Calculated) : 54.7  Temp (24hrs), Avg:97.7 F (36.5 C), Min:93.2 F (34 C), Max:99.32 F (37.4 C)  Recent Labs  Lab 08/29/20 1458 08/29/20 1821 08/29/20 2100 08/30/20 0732  WBC 25.8* 17.2*  --  12.3*  CREATININE 8.02* 6.32* 5.02* 2.74*  LATICACIDVEN 3.8* 4.8*  --  1.9    Estimated Creatinine Clearance: 21.2 mL/min (A) (by C-G formula based on SCr of 2.74 mg/dL (H)).    Allergies  Allergen Reactions  . Amoxicillin Rash  . Bactrim [Sulfamethoxazole-Trimethoprim] Rash    Rash noted to chest and back  . Zithromax [Azithromycin] Rash   Antimicrobials this admission: 1/2 vanc >>1/3 1/2 cefepime >> 1/2 flagyl >>   Microbiology results: 1/2 BCx x2:  1/2 UCx:   Thank you for allowing pharmacy to be a part of this patient's care.  10/27/20 08/30/2020 12:51 PM

## 2020-08-30 NOTE — Progress Notes (Addendum)
NAME:  Emily Park, MRN:  [id number], DOB:  11-Mar-1971, LOS: 1 ADMISSION DATE:  08/29/2020, CONSULTATION DATE:  08/29/2020 REFERRING MD:  Dr Rosalia Hammers, CHIEF COMPLAINT:  Septic shock   Brief History:  50 y/o F admitted 1/2 with a 3-day history feeling poorly with N/V/D.  She had a syncopal episode on day of admit from feeling lightheaded.   Never smoker, denied fevers / chills.  Did not remember the last time she passed urine on admit. Vaccinated for COVID.  Her son was exposed to COVID and tested positive.  Questionable hx of Addison's Disease (normal MRI in past, low am cortisol). Chart review shows she has had frequent admissions for hypotension, diarrhea in the setting of laxative use.    Past Medical History:  Abnormal TSH ADD Anemia  Anxiety  Depression  HLD  Hypokalemia  Difficulty Sleeping  Arrhythmia - SVT Heart Murmur  Migraines  Abnormal PAP Smear  Significant Hospital Events:  1/02 Admit with hypotension, N/V/D, syncopal episode    Consults:     Procedures:     Significant Diagnostic Tests:    Micro Data:  COVID 1/2 >> negative  Influenza A/B 1/2 >> negative HIV 1/2 >> negative   BCx2 1/2 >>  GI PCR 1/2 >>  UC 1/2 >>   Antimicrobials:  Vancomycin 1/2 >> 1/3   Flagyl 1/2 >> Cefepime 1/2 >>  Interim History / Subjective:  Pt reports she is hungry.  Feels tired but some better. Denies diarrhea, vomiting since admit. Up to date on GYN exams, has not had a colonoscopy yet. Denies weight loss.  Tmax 99.3  On RA IV leaked into bed, RN feels she may have not received complete bolus I/O 3L UOP, 5.2L+ in last 24 hours  Objective   Blood pressure 127/68, pulse 90, temperature 99.32 F (37.4 C), temperature source Bladder, resp. rate 16, height 5\' 4"  (1.626 m), weight 55.6 kg, SpO2 96 %.        Intake/Output Summary (Last 24 hours) at 08/30/2020 0825 Last data filed at 08/30/2020 10/28/2020 Gross per 24 hour  Intake 8261.05 ml  Output 3030 ml  Net 5231.05 ml    Filed Weights   08/29/20 1450 08/29/20 2300 08/30/20 0500  Weight: 53 kg 55.6 kg 55.6 kg    Examination: General: adult female lying in bed in NAD HEENT: MM pink/dry, ? white patches on tongue / possible thrush, anicteric  Neuro: awakens to voice, speech clear, MAE CV: s1s2 RRR, no m/r/g PULM: non-labored on RA, lungs bilaterally clear GI: soft, bsx4 active  Extremities: warm/dry, no edema  Skin: no rashes or lesions  Resolved Hospital Problem list     Assessment & Plan:   Shock, suspected Septic. No clear source identified as of 1/3.  Hx Diarrhea, N/V.   Hx of laxative use in past.  S/p 65ml/kg, remains hypotensive.  May still be behind in terms of volume. Lactic acid elevated. Rule out component of AI. Son COVID positive, she is negative on admit. No hx of respiratory sx, CXR clear.  -follow cultures, GI PCR, UA  -continue empiric abx -trend lactic acid, PCT  -neosynephrine for MAP >65 -reassess for IVF need post PRBC transfusion -assess cortisol, r/o AI  -pending above, consider stress dose steroids  -stop vancomycin   Acute kidney injury Oliguria Metabolic Acidosis  Probable component of non-gap with diarrhea but improving with supportive care.  -continue LR post transfusion  -volume resuscitation with PRBC -Trend BMP / urinary output -Replace electrolytes  as indicated -Avoid nephrotoxic agents, ensure adequate renal perfusion  Hypokalemia Hypomagnesemia  Hyponatremia  -assess cortisol  -monitor, replace as indicated - 2gm Mg / KCL 1/3  Anemia  No clear source bleeding  -assess anemia panel  -trend CBC -transfuse for Hgb <7% or active bleeding -add empiric PO protonix given anemia, no clear evidence of GIB  Leukocytosis  Suspect hemoconcentration on admit.   -follow CBC  ADHD Chronic migraine Depression -supportive care  -resume prozac, topamax -hold adderall   Hx PSVT  -tele monitoring   CT from 2017 with lung nodules -outpatient follow  up  Best practice (evaluated daily)  Diet: As tolerated Pain/Anxiety/Delirium protocol (if indicated): Tylenol VAP protocol (if indicated): Not indicated DVT prophylaxis: SCD's with anemia  GI prophylaxis: PPI Glucose control: Will monitor Mobility: Bedrest Disposition: ICU  Goals of Care:  Last date of multidisciplinary goals of care discussion: Pending Family and staff present: no Summary of discussion: Pending Follow up goals of care discussion due: Pending Code Status: Full code  Labs   CBC: Recent Labs  Lab 08/29/20 1458 08/29/20 1821 08/29/20 1908  WBC 25.8* 17.2*  --   NEUTROABS 22.6*  --   --   HGB 9.8* 6.3* 6.3*  HCT 32.6* 21.2* 21.2*  MCV 84.7 86.2  --   PLT 717* 401*  --     Basic Metabolic Panel: Recent Labs  Lab 08/29/20 1458 08/29/20 1821 08/29/20 2100  NA 132*  --  134*  K 3.9  --  3.3*  CL 97*  --  107  CO2 11*  --  13*  GLUCOSE 137*  --  96  BUN 80*  --  59*  CREATININE 8.02* 6.32* 5.02*  CALCIUM 8.2*  --  6.9*  MG  --  1.3*  --   PHOS  --  6.8*  --    GFR: Estimated Creatinine Clearance: 11.6 mL/min (A) (by C-G formula based on SCr of 5.02 mg/dL (H)). Recent Labs  Lab 08/29/20 1458 08/29/20 1821  PROCALCITON  --  0.12  WBC 25.8* 17.2*  LATICACIDVEN 3.8* 4.8*    Liver Function Tests: Recent Labs  Lab 08/29/20 1458  AST 19  ALT 19  ALKPHOS 73  BILITOT 0.4  PROT 8.8*  ALBUMIN 3.7   No results for input(s): LIPASE, AMYLASE in the last 168 hours. No results for input(s): AMMONIA in the last 168 hours.  ABG    Component Value Date/Time   TCO2 21 05/20/2009 0335     Coagulation Profile: Recent Labs  Lab 08/29/20 1458  INR 1.2    Cardiac Enzymes: No results for input(s): CKTOTAL, CKMB, CKMBINDEX, TROPONINI in the last 168 hours.  HbA1C: No results found for: HGBA1C  CBG: Recent Labs  Lab 08/29/20 1448  GLUCAP 107*      Critical Care Time: 35 minutes   Noe Gens, MSN, NP-C, AGACNP-BC [place]  [facility] 08/30/2020, 8:53 AM   Please see [email or web address] for pager details.

## 2020-08-31 DIAGNOSIS — I9589 Other hypotension: Secondary | ICD-10-CM | POA: Diagnosis not present

## 2020-08-31 DIAGNOSIS — N170 Acute kidney failure with tubular necrosis: Secondary | ICD-10-CM | POA: Diagnosis not present

## 2020-08-31 DIAGNOSIS — E861 Hypovolemia: Secondary | ICD-10-CM | POA: Diagnosis not present

## 2020-08-31 LAB — C DIFFICILE QUICK SCREEN W PCR REFLEX
C Diff antigen: NEGATIVE
C Diff interpretation: NOT DETECTED
C Diff toxin: NEGATIVE

## 2020-08-31 LAB — COMPREHENSIVE METABOLIC PANEL
ALT: 13 U/L (ref 0–44)
AST: 13 U/L — ABNORMAL LOW (ref 15–41)
Albumin: 2.7 g/dL — ABNORMAL LOW (ref 3.5–5.0)
Alkaline Phosphatase: 49 U/L (ref 38–126)
Anion gap: 11 (ref 5–15)
BUN: 22 mg/dL — ABNORMAL HIGH (ref 6–20)
CO2: 19 mmol/L — ABNORMAL LOW (ref 22–32)
Calcium: 7.9 mg/dL — ABNORMAL LOW (ref 8.9–10.3)
Chloride: 110 mmol/L (ref 98–111)
Creatinine, Ser: 1.05 mg/dL — ABNORMAL HIGH (ref 0.44–1.00)
GFR, Estimated: >60 mL/min (ref >60)
Glucose, Bld: 128 mg/dL — ABNORMAL HIGH (ref 70–99)
Potassium: 3.6 mmol/L (ref 3.5–5.1)
Sodium: 140 mmol/L (ref 135–145)
Total Bilirubin: 0.4 mg/dL (ref 0.3–1.2)
Total Protein: 5.9 g/dL — ABNORMAL LOW (ref 6.5–8.1)

## 2020-08-31 LAB — PROCALCITONIN: Procalcitonin: <0.1 ng/mL

## 2020-08-31 LAB — CBC
HCT: 28.1 % — ABNORMAL LOW (ref 36.0–46.0)
Hemoglobin: 9 g/dL — ABNORMAL LOW (ref 12.0–15.0)
MCH: 26.9 pg (ref 26.0–34.0)
MCHC: 32 g/dL (ref 30.0–36.0)
MCV: 84.1 fL (ref 80.0–100.0)
Platelets: 334 10*3/uL (ref 150–400)
RBC: 3.34 MIL/uL — ABNORMAL LOW (ref 3.87–5.11)
RDW: 15.4 % (ref 11.5–15.5)
WBC: 7 10*3/uL (ref 4.0–10.5)
nRBC: 0 % (ref 0.0–0.2)

## 2020-08-31 LAB — URINE CULTURE: Culture: NO GROWTH

## 2020-08-31 LAB — TYPE AND SCREEN
ABO/RH(D): O POS
Antibody Screen: NEGATIVE
Unit division: 0
Unit division: 0

## 2020-08-31 LAB — BPAM RBC
Blood Product Expiration Date: 202202022359
Blood Product Expiration Date: 202202022359
ISSUE DATE / TIME: 202201030016
ISSUE DATE / TIME: 202201030257
Unit Type and Rh: 5100
Unit Type and Rh: 5100

## 2020-08-31 LAB — IGA: IgA: 172 mg/dL (ref 87–352)

## 2020-08-31 LAB — HEMOGLOBIN A1C
Hgb A1c MFr Bld: 5.5 % (ref 4.8–5.6)
Mean Plasma Glucose: 111.15 mg/dL

## 2020-08-31 LAB — MAGNESIUM: Magnesium: 1.7 mg/dL (ref 1.7–2.4)

## 2020-08-31 MED ORDER — THIAMINE HCL 100 MG PO TABS
100.0000 mg | ORAL_TABLET | Freq: Every day | ORAL | Status: DC
  Administered 2020-08-31 – 2020-09-02 (×3): 100 mg via ORAL
  Filled 2020-08-31 (×3): qty 1

## 2020-08-31 MED ORDER — ADULT MULTIVITAMIN W/MINERALS CH
1.0000 | ORAL_TABLET | Freq: Every day | ORAL | Status: DC
  Administered 2020-08-31 – 2020-09-02 (×3): 1 via ORAL
  Filled 2020-08-31 (×3): qty 1

## 2020-08-31 MED ORDER — MAGNESIUM SULFATE 2 GM/50ML IV SOLN
2.0000 g | Freq: Once | INTRAVENOUS | Status: AC
  Administered 2020-08-31: 2 g via INTRAVENOUS
  Filled 2020-08-31: qty 50

## 2020-08-31 MED ORDER — FOLIC ACID 1 MG PO TABS
1.0000 mg | ORAL_TABLET | Freq: Every day | ORAL | Status: DC
  Administered 2020-08-31 – 2020-09-02 (×3): 1 mg via ORAL
  Filled 2020-08-31 (×3): qty 1

## 2020-08-31 MED ORDER — MAGNESIUM SULFATE 2 GM/50ML IV SOLN: 2.0000 g | Freq: Once | INTRAVENOUS | Status: DC

## 2020-08-31 NOTE — Progress Notes (Signed)
NAME:  Emily Park, MRN:  [id number], DOB:  [birth year], LOS: 2 ADMISSION DATE:  08/29/2020, CONSULTATION DATE:  08/29/2020 REFERRING MD:  Dr Rosalia Hammers, CHIEF COMPLAINT:  Septic shock   Brief History:  50 y/o F admitted 1/2 with a 3-day history feeling poorly with N/V/D.  She had a syncopal episode on day of admit from feeling lightheaded.   Never smoker, denied fevers / chills.  Did not remember the last time she passed urine on admit. Vaccinated for COVID.  Her son was exposed to COVID and tested positive.  Questionable hx of Addison's Disease (normal MRI in past, low am cortisol). Chart review shows she has had frequent admissions for hypotension, diarrhea in the setting of laxative use in the past.    Past Medical History:  Abnormal TSH ADD Anemia  Anxiety  Depression  HLD  Hypokalemia  Difficulty Sleeping  Arrhythmia - SVT Heart Murmur  Migraines  Abnormal PAP Smear  Significant Hospital Events:  1/02 Admit with hypotension, N/V/D, syncopal episode   1/03 Cortisol 4.7, stress dose steroids started, pressors weaned off  1/04 Tx to [facility], to med surg   Consults:     Procedures:     Significant Diagnostic Tests:    Micro Data:  COVID 1/2 >> negative  Influenza A/B 1/2 >> negative HIV 1/2 >> negative   BCx2 1/2 >>  GI PCR 1/2 >>  C-Diff 1/3 >>  UC 1/2 >> negative   Antimicrobials:  Vancomycin 1/2 >> 1/3   Flagyl 1/2 >> Cefepime 1/2 >> 1/4  Interim History / Subjective:  Vasopressors weaned off 1/3 afternoon  Afebrile  States she had a UTI approx 1 month prior to admit and was treated with abx. She has intermittently used laxatives.  Feels better, eating, no nausea  Objective   Blood pressure (!) 106/50, pulse 77, temperature 98.2 F (36.8 C), temperature source Oral, resp. rate 14, height 5\' 4"  (1.626 m), weight 65.9 kg, SpO2 97 %.        Intake/Output Summary (Last 24 hours) at 08/31/2020 0833 Last data filed at 08/31/2020 10/29/2020 Gross per 24 hour  Intake  2185.75 ml  Output 7300 ml  Net -5114.25 ml   Filed Weights   08/29/20 2300 08/30/20 0500 08/31/20 0428  Weight: 55.6 kg 55.6 kg 65.9 kg    Examination: General: adult female lying in bed in NAD  HEENT: MM pink/moist, anicteric Neuro: AAOx4, speech clear, MAE  CV: s1s2 RRR, no m/r/g PULM:  Non-labored on RA, lungs clear bilaterally  GI: soft, bsx4 active  Extremities: warm/dry, no edema  Skin: no rashes or lesions  Resolved Hospital Problem list     Assessment & Plan:   Hypovolemic Shock Adrenal Insufficiency  Hx Diarrhea, N/V.   Hx of laxative use in past.  S/p 88ml/kg, remained hypotensive. Lactic acid elevated on admit but cleared. Son COVID positive, she is negative on admit. No hx of respiratory sx, CXR clear. No clear source infection identified.  -follow cultures to maturity  -continue empiric abx, consider stopping 1/5  -discontinue neosynephrine off MAR  -continue stress dose steroids for 24 hours, then consider transition to oral prednisone, may need fludrocortisone at some point  -will need outpatient referral with close follow up for endocrine   Acute kidney injury Oliguria Metabolic Acidosis  Probable component of non-gap with diarrhea but improving with supportive care. Cleared with IVF/resuscitation. -Trend BMP / urinary output -Replace electrolytes as indicated -Avoid nephrotoxic agents, ensure adequate renal perfusion  Hypokalemia  Hypomagnesemia  Hyponatremia  -Monitor electrolytes, K/Mg replaced  Normocytic, Normochromic Anemia  No clear source bleeding, transfused 1/3. Anemia panel wnl.  -follow CBC -transfuse for Hgb <7% -continue empiric PPI with anemia, no clear source GIB   Leukocytosis  Suspect hemoconcentration on admit.   -monitor, no clear evidence of infection   ADHD Chronic migraine Depression -supportive care -continue home prozac, topamax   Hx PSVT  -no events while in ICU/SDU  CT from 2017 with lung nodules -follow up  as outpatient   Best practice (evaluated daily)  Diet: As tolerated Pain/Anxiety/Delirium protocol (if indicated): Tylenol VAP protocol (if indicated): Not indicated DVT prophylaxis: SCD's with anemia  GI prophylaxis: PPI Glucose control: Will monitor Mobility: Bedrest Disposition: transfer to med surg, to [facility] as of 1/5  Goals of Care:  Last date of multidisciplinary goals of care discussion: Pending Family and staff present: no Summary of discussion: Pending Follow up goals of care discussion due: Pending Code Status: Full code  Labs   CBC: Recent Labs  Lab 08/29/20 1458 08/29/20 1821 08/29/20 1908 08/30/20 0732 08/31/20 0250  WBC 25.8* 17.2*  --  12.3* 7.0  NEUTROABS 22.6*  --   --  8.6*  --   HGB 9.8* 6.3* 6.3* 10.4* 9.0*  HCT 32.6* 21.2* 21.2* 32.1* 28.1*  MCV 84.7 86.2  --  82.7 84.1  PLT 717* 401*  --  470* [id number]    Basic Metabolic Panel: Recent Labs  Lab 08/29/20 1458 08/29/20 1821 08/29/20 2100 08/30/20 0732 08/30/20 1352 08/31/20 0250  NA 132*  --  134* 143 141 140  K 3.9  --  3.3* 2.8* 3.1* 3.6  CL 97*  --  107 115* 112* 110  CO2 11*  --  13* 18* 19* 19*  GLUCOSE 137*  --  96 81 113* 128*  BUN 80*  --  59* 48* 31* 22*  CREATININE 8.02* 6.32* 5.02* 2.74* 1.73* 1.05*  CALCIUM 8.2*  --  6.9* 7.6* 7.6* 7.9*  MG  --  1.3*  --   --   --  1.7  PHOS  --  6.8*  --   --   --   --    GFR: Estimated Creatinine Clearance: 59.9 mL/min (A) (by C-G formula based on SCr of 1.05 mg/dL (H)). Recent Labs  Lab 08/29/20 1458 08/29/20 1821 08/30/20 0732 08/30/20 1024 08/31/20 0250  PROCALCITON  --  0.12  --  <0.10 <0.10  WBC 25.8* 17.2* 12.3*  --  7.0  LATICACIDVEN 3.8* 4.8* 1.9  --   --     Liver Function Tests: Recent Labs  Lab 08/29/20 1458 08/31/20 0250  AST 19 13*  ALT 19 13  ALKPHOS 73 49  BILITOT 0.4 0.4  PROT 8.8* 5.9*  ALBUMIN 3.7 2.7*   No results for input(s): LIPASE, AMYLASE in the last 168 hours. No results for input(s): AMMONIA in the  last 168 hours.  ABG    Component Value Date/Time   TCO2 21 05/20/2009 0335     Coagulation Profile: Recent Labs  Lab 08/29/20 1458  INR 1.2    Cardiac Enzymes: No results for input(s): CKTOTAL, CKMB, CKMBINDEX, TROPONINI in the last 168 hours.  HbA1C: No results found for: HGBA1C  CBG: Recent Labs  Lab 08/29/20 1448  GLUCAP 107*      Critical Care Time: n/a   Noe Gens, MSN, NP-C, AGACNP-BC [place] [facility] 08/31/2020, 8:33 AM   Please see [email or web address] for pager details.

## 2020-08-31 NOTE — Progress Notes (Signed)
[facility] ADULT ICU REPLACEMENT PROTOCOL   The patient does apply for the [facility] Adult ICU Electrolyte Replacment Protocol based on the criteria listed below:   1. Is GFR >/= 30 ml/min? Yes.    Patient's GFR today is >60 2. Is SCr </= 2? Yes.   Patient's SCr is 1.05 ml/kg/hr 3. Did SCr increase >/= 0.5 in 24 hours? No. 4. Abnormal electrolyte(s): Mag 1.7  5. Ordered repletion with: protocol 6. If a panic level lab has been reported, has the CCM MD in charge been notified? Yes.  .   Physician:  Lebron Conners 08/31/2020 5:49 AM

## 2020-09-01 DIAGNOSIS — R571 Hypovolemic shock: Secondary | ICD-10-CM | POA: Diagnosis not present

## 2020-09-01 DIAGNOSIS — I9589 Other hypotension: Secondary | ICD-10-CM | POA: Diagnosis not present

## 2020-09-01 DIAGNOSIS — G43909 Migraine, unspecified, not intractable, without status migrainosus: Secondary | ICD-10-CM

## 2020-09-01 DIAGNOSIS — N179 Acute kidney failure, unspecified: Secondary | ICD-10-CM | POA: Diagnosis not present

## 2020-09-01 LAB — GASTROINTESTINAL PANEL BY PCR, STOOL (REPLACES STOOL CULTURE)

## 2020-09-01 LAB — BASIC METABOLIC PANEL
Anion gap: 11 (ref 5–15)
BUN: 23 mg/dL — ABNORMAL HIGH (ref 6–20)
CO2: 19 mmol/L — ABNORMAL LOW (ref 22–32)
Calcium: 8.4 mg/dL — ABNORMAL LOW (ref 8.9–10.3)
Chloride: 107 mmol/L (ref 98–111)
Creatinine, Ser: 1.14 mg/dL — ABNORMAL HIGH (ref 0.44–1.00)
GFR, Estimated: 59 mL/min — ABNORMAL LOW (ref >60)
Glucose, Bld: 131 mg/dL — ABNORMAL HIGH (ref 70–99)
Potassium: 3 mmol/L — ABNORMAL LOW (ref 3.5–5.1)
Sodium: 137 mmol/L (ref 135–145)

## 2020-09-01 LAB — CBC
HCT: 30.6 % — ABNORMAL LOW (ref 36.0–46.0)
Hemoglobin: 9.7 g/dL — ABNORMAL LOW (ref 12.0–15.0)
MCH: 26.3 pg (ref 26.0–34.0)
MCHC: 31.7 g/dL (ref 30.0–36.0)
MCV: 82.9 fL (ref 80.0–100.0)
Platelets: 406 10*3/uL — ABNORMAL HIGH (ref 150–400)
RBC: 3.69 MIL/uL — ABNORMAL LOW (ref 3.87–5.11)
RDW: 15.5 % (ref 11.5–15.5)
WBC: 10.3 10*3/uL (ref 4.0–10.5)
nRBC: 0 % (ref 0.0–0.2)

## 2020-09-01 LAB — MAGNESIUM: Magnesium: 2.2 mg/dL (ref 1.7–2.4)

## 2020-09-01 MED ORDER — SODIUM CHLORIDE 0.9 % IV SOLN
2.0000 g | Freq: Two times a day (BID) | INTRAVENOUS | Status: DC
  Administered 2020-09-01: 2 g via INTRAVENOUS
  Filled 2020-09-01: qty 2

## 2020-09-01 MED ORDER — HYDROCORTISONE 10 MG PO TABS
10.0000 mg | ORAL_TABLET | Freq: Every day | ORAL | Status: DC
  Administered 2020-09-01: 10 mg via ORAL
  Filled 2020-09-01 (×2): qty 1

## 2020-09-01 MED ORDER — DIPHENHYDRAMINE HCL 50 MG/ML IJ SOLN: 25.0000 mg | Freq: Once | INTRAMUSCULAR | Status: DC

## 2020-09-01 MED ORDER — HYDROCORTISONE 5 MG PO TABS
5.0000 mg | ORAL_TABLET | Freq: Every day | ORAL | Status: DC
  Administered 2020-09-01: 5 mg via ORAL
  Filled 2020-09-01 (×2): qty 1

## 2020-09-01 MED ORDER — DIPHENHYDRAMINE HCL 25 MG PO CAPS
25.0000 mg | ORAL_CAPSULE | Freq: Once | ORAL | Status: AC
  Administered 2020-09-01: 25 mg via ORAL
  Filled 2020-09-01: qty 1

## 2020-09-01 NOTE — Progress Notes (Signed)
Pharmacy Antibiotic Note  Emily Park is a 50 y.o. female presented to the ED on 08/29/2020 with diarrhea, emesis and syncopy.  She was started on broad abx on admission for sepsis.  Today, 09/01/2020: - Day #4 cefepime and flagyl; vancomycin d/ced by Dr. Judeth Horn on 1/3 - Afebrile, WBC down - Scr greatly improved - est CrCl now 55 ml/min - all cultures have been negative   Plan: - adjust cefepime from 2g IV q24h to 2g IV q12 - flagyl 500mg  q8h - As per CCM note on 1/4, suggest d/c'ing antibiotics today  _______________________________________  Height: 5\' 4"  (162.6 cm) Weight: 61.3 kg (135 lb 2.3 oz) IBW/kg (Calculated) : 54.7  Temp (24hrs), Avg:98.1 F (36.7 C), Min:97.5 F (36.4 C), Max:98.4 F (36.9 C)  Recent Labs  Lab 08/29/20 1458 08/29/20 1821 08/29/20 2100 08/30/20 0732 08/30/20 1352 08/31/20 0250  WBC 25.8* 17.2*  --  12.3*  --  7.0  CREATININE 8.02* 6.32* 5.02* 2.74* 1.73* 1.05*  LATICACIDVEN 3.8* 4.8*  --  1.9  --   --     Estimated Creatinine Clearance: 55.4 mL/min (A) (by C-G formula based on SCr of 1.05 mg/dL (H)).    Allergies  Allergen Reactions  . Amoxicillin Rash  . Bactrim [Sulfamethoxazole-Trimethoprim] Rash    Rash noted to chest and back  . Zithromax [Azithromycin] Rash   Antimicrobials this admission: 1/2 vanc >>1/3 1/2 cefepime >> 1/2 flagyl >>   Microbiology results: 1/2 BCx x2: ngtd 1/2 UCx: ngtd  Thank you for allowing pharmacy to be a part of this patient's care.  10/28/20 09/01/2020 8:30 AM

## 2020-09-01 NOTE — Progress Notes (Signed)
PROGRESS NOTE    Emily Park  [id number] DOB: 1970/11/02 DOA: 08/29/2020 PCP: [name], MD   Brief Narrative: Emily Park is a 50 y.o. female with a history of anxiety, anemia, ADHD, hyperlipidemia, migraines, SVT.  Patient presented secondary to not feeling well with nausea, vomiting, lightheadedness with evidence of hypovolemic shock requiring fluid resuscitation in addition to vasopressor support in the ICU.  Patient was successfully fluid resuscitated and blood pressure is improved.  Component of likely renal insufficiency and stress dose steroids were initiated.   Assessment & Plan:   Principal Problem:   Hypovolemic shock ([place]) Active Problems:   Migraine   AKI (acute kidney injury) ([place])   Hypotension   Hypovolemic shock Initial concern for infection which appears to be ruled out. Although patient had significant leukocytosis, possibly related to severe hemoconcentration. Improved with IV fluids and vasopressor support for BP management. Resolved. -Discontinue IV antibiotics  AKI In setting of hypovolemia and hypotension. No baseline creatinine. Creatinine of 8.02 on admission with significant improvement and apparent resolution of AKI.  Hypotension In setting of above but patient also with a possible history of Addison's disease. Cortisol of 4.7 this admission. Patient was previously scheduled for endocrine follow-up/workup but failed to follow through with appointments. Started on hydrocortisone 50 mg IV for stress dosing -Discontinue Solucortef and start Cortef 10 mg in AM and 5 mg in afternoon after lunch -ACTH stimulation test  Paroxysmal SVT Noted.  Anxiety -Continue Klonopin 0.25 mg BID prn and Prozac 40 mg daily  Anemia Unknown etiology. No recent CBC to compare. Hemoglobin of 9.8 on admission which dropped to 6.3 with IV resuscitation and not likely secondary to acute anemia. Patient received 2 units of PRBC and is stable.  Migraine  headaches -Continue Topamax 150 mg BID  ADHD On Adderall as an outpatient. Holding while inpatient.  Lung nodules Outpatient follow-up.   DVT prophylaxis: SCDs Code Status:   Code Status: Full Code Family Communication: None at bedside Disposition Plan: Discharge home likely tomorrow pending continued stabilization of BP off of IV solucortef   Consultants:   PCCM  Procedures:   None  Antimicrobials:  Vancomycin  Cefepime  Flagyl    Subjective: No issues overnight. Feels well.  Objective: Vitals:   08/31/20 1804 08/31/20 2139 09/01/20 0629 09/01/20 1349  BP: 128/76 126/81 111/69 (!) 130/96  Pulse: 90 86 83 89  Resp: 18 16 14 16   Temp: 98.3 F (36.8 C) (!) 97.5 F (36.4 C) 98.4 F (36.9 C) 98.4 F (36.9 C)  TempSrc: Oral Oral Oral Oral  SpO2: 99% 100% 100% 99%  Weight:      Height:        Intake/Output Summary (Last 24 hours) at 09/01/2020 1405 Last data filed at 09/01/2020 0500 Gross per 24 hour  Intake --  Output 550 ml  Net -550 ml   Filed Weights   08/30/20 0500 08/31/20 0428 08/31/20 1057  Weight: 55.6 kg 65.9 kg 61.3 kg    Examination:  General exam: Appears calm and comfortable Respiratory system: Clear to auscultation. Respiratory effort normal. Cardiovascular system: S1 & S2 heard, RRR. No murmurs, rubs, gallops or clicks. Gastrointestinal system: Abdomen is nondistended, soft and nontender. No organomegaly or masses felt. Normal bowel sounds heard. Central nervous system: Alert and oriented. No focal neurological deficits. Musculoskeletal: No edema. No calf tenderness Skin: No cyanosis. No rashes Psychiatry: Judgement and insight appear normal. Mood & affect appropriate.     Data Reviewed: I  have personally reviewed following labs and imaging studies  CBC Lab Results  Component Value Date   WBC 10.3 09/01/2020   RBC 3.69 (L) 09/01/2020   HGB 9.7 (L) 09/01/2020   HCT 30.6 (L) 09/01/2020   MCV 82.9 09/01/2020   MCH 26.3  09/01/2020   PLT 406 (H) 09/01/2020   MCHC 31.7 09/01/2020   RDW 15.5 09/01/2020   LYMPHSABS 2.2 08/30/2020   MONOABS 1.2 (H) 08/30/2020   EOSABS 0.1 08/30/2020   BASOSABS 0.1 [id number]     Last metabolic panel Lab Results  Component Value Date   NA 137 09/01/2020   K 3.0 (L) 09/01/2020   CL 107 09/01/2020   CO2 19 (L) 09/01/2020   BUN 23 (H) 09/01/2020   CREATININE 1.14 (H) 09/01/2020   GLUCOSE 131 (H) 09/01/2020   GFRNONAA 59 (L) 09/01/2020   GFRAA >60 11/14/2015   CALCIUM 8.4 (L) 09/01/2020   PHOS 6.8 (H) 08/29/2020   PROT 5.9 (L) 08/31/2020   ALBUMIN 2.7 (L) 08/31/2020   BILITOT 0.4 08/31/2020   ALKPHOS 49 08/31/2020   AST 13 (L) 08/31/2020   ALT 13 08/31/2020   ANIONGAP 11 09/01/2020    CBG (last 3)  Recent Labs    08/29/20 1448  GLUCAP 107*     GFR: Estimated Creatinine Clearance: 51 mL/min (A) (by C-G formula based on SCr of 1.14 mg/dL (H)).  Coagulation Profile: Recent Labs  Lab 08/29/20 1458  INR 1.2    Recent Results (from the past 240 hour(s))  Blood Culture (routine x 2)     Status: None (Preliminary result)   Collection Time: 08/29/20  2:58 PM   Specimen: BLOOD  Result Value Ref Range Status   Specimen Description   Final    BLOOD RIGHT ANTECUBITAL Performed at [facility] [address] [place], [place] [address]    Special Requests   Final    BOTTLES DRAWN AEROBIC AND ANAEROBIC Blood Culture adequate volume Performed at [facility] [address], [place], [place] [address]    Culture   Final    NO GROWTH 3 DAYS Performed at [facility], [place] [address] [place], [place] [address]    Report Status PENDING  Incomplete  Blood Culture (routine x 2)     Status: None (Preliminary result)   Collection Time: 08/29/20  3:03 PM   Specimen: BLOOD  Result Value Ref Range Status   Specimen Description   Final    BLOOD LEFT WRIST Performed at [facility] [address] [place], [place] [address]    Special Requests   Final    BOTTLES DRAWN AEROBIC ONLY Blood Culture results may not be optimal due to an inadequate volume of blood received in culture bottles Performed at [facility] [address] [place], [place] [address]    Culture   Final    NO GROWTH 3 DAYS Performed at [facility], [place] [address], [place], [place] [address]    Report Status PENDING  Incomplete  Resp Panel by RT-PCR (Flu A&B, Covid) Nasopharyngeal Swab     Status: None   Collection Time: 08/29/20  3:14 PM   Specimen: Nasopharyngeal Swab; Nasopharyngeal(NP) swabs in vial transport medium  Result Value Ref Range Status   SARS Coronavirus 2 by RT PCR NEGATIVE NEGATIVE Final    Comment: (NOTE) SARS-CoV-2 target nucleic acids are NOT DETECTED.  The SARS-CoV-2 RNA is generally detectable in upper  respiratory specimens during the acute phase of infection. The lowest concentration of SARS-CoV-2 viral copies this assay can detect is 138 copies/mL. A negative result does not preclude SARS-Cov-2 infection and should not be used as the sole basis for treatment or other patient management decisions. A negative result may occur with  improper specimen collection/handling, submission of specimen other than nasopharyngeal swab, presence of viral mutation(s) within the areas targeted by this assay, and inadequate number of viral copies(<138 copies/mL). A negative result must be combined with clinical observations, patient history, and epidemiological information. The expected result is Negative.  Fact Sheet for Patients:  BloggerCourse.com  Fact Sheet for Healthcare Providers:  SeriousBroker.it  This test is no t yet approved or cleared by the [place] FDA and  has been authorized for detection and/or diagnosis of SARS-CoV-2 by FDA under an Emergency Use Authorization (EUA). This EUA will remain  in effect  (meaning this test can be used) for the duration of the COVID-19 declaration under Section 564(b)(1) of the Act, 21 U.S.C.section 360bbb-3(b)(1), unless the authorization is terminated  or revoked sooner.       Influenza A by PCR NEGATIVE NEGATIVE Final   Influenza B by PCR NEGATIVE NEGATIVE Final    Comment: (NOTE) The Xpert Xpress SARS-CoV-2/FLU/RSV plus assay is intended as an aid in the diagnosis of influenza from Nasopharyngeal swab specimens and should not be used as a sole basis for treatment. Nasal washings and aspirates are unacceptable for Xpert Xpress SARS-CoV-2/FLU/RSV testing.  Fact Sheet for Patients: BloggerCourse.com  Fact Sheet for Healthcare Providers: SeriousBroker.it  This test is not yet approved or cleared by the [place] FDA and has been authorized for detection and/or diagnosis of SARS-CoV-2 by FDA under an Emergency Use Authorization (EUA). This EUA will remain in effect (meaning this test can be used) for the duration of the COVID-19 declaration under Section 564(b)(1) of the Act, 21 U.S.C. section 360bbb-3(b)(1), unless the authorization is terminated or revoked.  Performed at [facility], [address], [place], [place] [address]   Urine culture     Status: None   Collection Time: 08/29/20  6:21 PM   Specimen: In/Out Cath Urine  Result Value Ref Range Status   Specimen Description   Final    IN/OUT CATH URINE Performed at [facility], [address], [place], [place] [address]    Special Requests   Final    NONE Performed at [facility], [address] [place], [place] [address]    Culture   Final    NO GROWTH Performed at [facility], [address], [place], [place] [address]    Report Status 08/31/2020 FINAL  Final  Gastrointestinal Panel by PCR , Stool     Status: None   Collection Time: 08/31/20 11:00 AM    Specimen: Stool  Result Value Ref Range Status   Campylobacter species NOT DETECTED NOT DETECTED Final   Plesimonas shigelloides NOT DETECTED NOT DETECTED Final   Salmonella species NOT DETECTED NOT DETECTED Final   Yersinia enterocolitica NOT DETECTED NOT DETECTED Final   Vibrio species NOT DETECTED NOT DETECTED Final   Vibrio cholerae NOT DETECTED NOT DETECTED Final   Enteroaggregative E coli (EAEC) NOT DETECTED NOT DETECTED Final   Enteropathogenic E coli (EPEC) NOT DETECTED NOT DETECTED Final   Enterotoxigenic E coli (ETEC) NOT DETECTED NOT DETECTED Final   Shiga like toxin producing E coli (STEC) NOT DETECTED NOT DETECTED Final  Shigella/Enteroinvasive E coli (EIEC) NOT DETECTED NOT DETECTED Final   Cryptosporidium NOT DETECTED NOT DETECTED Final   Cyclospora cayetanensis NOT DETECTED NOT DETECTED Final   Entamoeba histolytica NOT DETECTED NOT DETECTED Final   Giardia lamblia NOT DETECTED NOT DETECTED Final   Adenovirus F40/41 NOT DETECTED NOT DETECTED Final   Astrovirus NOT DETECTED NOT DETECTED Final   Norovirus GI/GII NOT DETECTED NOT DETECTED Final   Rotavirus A NOT DETECTED NOT DETECTED Final   Sapovirus (I, II, IV, and V) NOT DETECTED NOT DETECTED Final    Comment: Performed at [facility], [address] [place], [place] [address]  C Difficile Quick Screen w PCR reflex     Status: None   Collection Time: 08/31/20 11:00 AM   Specimen: STOOL  Result Value Ref Range Status   C Diff antigen NEGATIVE NEGATIVE Final   C Diff toxin NEGATIVE NEGATIVE Final   C Diff interpretation No C. difficile detected.  Final    Comment: NEGATIVE Performed at [facility], [place] [address], [place], [place] [address]         Radiology Studies: No results found.      Scheduled Meds: . doxepin  75 mg Oral QHS  . FLUoxetine  40 mg Oral Daily  . folic acid  1 mg Oral Daily  . hydrocortisone  10 mg Oral Daily   And  . hydrocortisone  5 mg Oral Q1500   . multivitamin with minerals  1 tablet Oral Daily  . pantoprazole  40 mg Oral Daily  . thiamine  100 mg Oral Daily  . topiramate  150 mg Oral BID   Continuous Infusions: . sodium chloride Stopped (08/31/20 0838)     LOS: 3 days     Cordelia Poche, MD [facility] 09/01/2020, 2:05 PM  If 7PM-7AM, please contact night-coverage [email or web address]

## 2020-09-02 DIAGNOSIS — N179 Acute kidney failure, unspecified: Secondary | ICD-10-CM | POA: Diagnosis not present

## 2020-09-02 DIAGNOSIS — R571 Hypovolemic shock: Secondary | ICD-10-CM | POA: Diagnosis not present

## 2020-09-02 MED ORDER — COSYNTROPIN 0.25 MG IJ SOLR: 0.2500 mg | Freq: Once | INTRAMUSCULAR | Status: DC

## 2020-09-02 MED ORDER — HYDROCORTISONE 5 MG PO TABS: ORAL_TABLET | ORAL | 0 refills | Status: AC

## 2020-09-02 NOTE — Discharge Summary (Signed)
Physician Discharge Summary  Kallin Bolinsky [facility] [id number] DOB: 10/23/1970 DOA: 08/29/2020  PCP: [name], MD  Admit date: 08/29/2020 Discharge date: 09/02/2020  Admitted From: Home Disposition: Home  Recommendations for Outpatient Follow-up:  1. Follow up with PCP in 1 week 2. Follow up with endocrinology 3. Please obtain BMP/CBC in one week 4. Please follow up on the following pending results: None  Home Health: None Equipment/Devices: None  Discharge Condition: Stable CODE STATUS: Full code Diet recommendation: Heart healthy   Brief/Interim Summary:  Admission HPI written by Laurin Coder, MD   Brief History: 3-day history of not feeling well Nausea vomiting diarrhea Passed out today from feeling lightheaded  Has not had any fevers or chills  Does not remember the last time she passed urine  Vaccinated for Covid Son was exposed to Covid and tested positive  Never smoker   Hospital course:  Hypovolemic shock Initial concern for infection which appears to be ruled out. Although patient had significant leukocytosis, possibly related to severe hemoconcentration. Improved with IV fluids and vasopressor support for BP management. Resolved.  AKI In setting of hypovolemia and hypotension. No baseline creatinine. Creatinine of 8.02 on admission with significant improvement and apparent resolution of AKI.  Hypotension In setting of above but patient also with a possible history of Addison's disease. Cortisol of 4.7 this admission. Patient was previously scheduled for endocrine follow-up/workup but failed to follow through with appointments. Started on hydrocortisone 50 mg IV for stress dosing and transitioned to Cortef 10 mg in AM and 5 mg at 2 pm. Continue on discharge. Endocrinology follow-up.  Paroxysmal SVT Noted.  Anxiety Continue Klonopin 0.25 mg BID prn and Prozac 40 mg daily  Anemia Unknown etiology. No recent CBC to compare. Hemoglobin  of 9.8 on admission which dropped to 6.3 with IV resuscitation and not likely secondary to acute anemia. Patient received 2 units of PRBC and is stable.  Migraine headaches Continue Topamax 150 mg BID  ADHD On Adderall as an outpatient. Holding while inpatient.  Lung nodules Outpatient follow-up.  Discharge Diagnoses:  Principal Problem:   Hypovolemic shock ([place]) Active Problems:   Migraine   AKI (acute kidney injury) ([place])   Hypotension    Discharge Instructions  Discharge Instructions    Diet - low sodium heart healthy   Complete by: As directed    Increase activity slowly   Complete by: As directed      Allergies as of 09/02/2020      Reactions   Amoxicillin Rash   Bactrim [sulfamethoxazole-trimethoprim] Rash   Rash noted to chest and back   Zithromax [azithromycin] Rash      Medication List    STOP taking these medications   rizatriptan 10 MG disintegrating tablet Commonly known as: Maxalt-MLT   tiZANidine 4 MG tablet Commonly known as: Zanaflex     TAKE these medications   amphetamine-dextroamphetamine 30 MG 24 hr capsule Commonly known as: ADDERALL XR Take 30 mg by mouth daily as needed (ADHD).   BIOTIN PO Take 1 tablet by mouth daily.   clonazePAM 0.5 MG tablet Commonly known as: KLONOPIN Take 0.25 mg by mouth 2 (two) times daily as needed for anxiety.   FLUoxetine 40 MG capsule Commonly known as: PROZAC Take 40 mg by mouth daily.   hydrocortisone 5 MG tablet Commonly known as: CORTEF Take 2 tablets (10 mg total) by mouth in the morning AND 1 tablet (5 mg total) daily at 2 PM. Take morning dose  when you first wake up..   MULTIVITAMIN PO Take 1 tablet by mouth daily.   ondansetron 4 MG disintegrating tablet Commonly known as: Zofran ODT Take 1 tablet (4 mg total) by mouth every 8 (eight) hours as needed. What changed: reasons to take this   topiramate 100 MG tablet Commonly known as: TOPAMAX Take 1.5 tablets (150 mg total) by mouth  2 (two) times daily.       Follow-up Information    [name], MD. Schedule an appointment as soon as possible for a visit in 1 week(s).   Specialty: Internal Medicine Why: Hospital follow-up Contact information: [address] [place] [address] [phone]        Renato Shin, MD Follow up.   Specialty: Endocrinology Why: This is the endocrinologist you requested to see. This is his information. Please follow-up for evaluation of adrenal insufficiency.  Contact information: [address] [place] [address] [phone]              Allergies  Allergen Reactions  . Amoxicillin Rash  . Bactrim [Sulfamethoxazole-Trimethoprim] Rash    Rash noted to chest and back  . Zithromax [Azithromycin] Rash    Consultations:  PCCM   Procedures/Studies: DG Chest Port 1 View  Result Date: 08/29/2020 CLINICAL DATA:  Sepsis EXAM: PORTABLE CHEST 1 VIEW COMPARISON:  11/12/2015 FINDINGS: Heart and mediastinal contours are within normal limits. No focal opacities or effusions. No acute bony abnormality. IMPRESSION: Negative. Electronically Signed   By: Rolm Baptise M.D.   On: 08/29/2020 15:32      Subjective: No issues this morning  Discharge Exam: Vitals:   09/01/20 2025 09/02/20 0544  BP: 112/82 101/63  Pulse: (!) 101 78  Resp: 16 14  Temp: 98.3 F (36.8 C) 97.9 F (36.6 C)  SpO2: 99% 98%   Vitals:   09/01/20 0629 09/01/20 1349 09/01/20 2025 09/02/20 0544  BP: 111/69 (!) 130/96 112/82 101/63  Pulse: 83 89 (!) 101 78  Resp: 14 16 16 14   Temp: 98.4 F (36.9 C) 98.4 F (36.9 C) 98.3 F (36.8 C) 97.9 F (36.6 C)  TempSrc: Oral Oral Oral Oral  SpO2: 100% 99% 99% 98%  Weight:      Height:        General: Pt is alert, awake, not in acute distress Cardiovascular: RRR, S1/S2 +, no rubs, no gallops Respiratory: CTA bilaterally, no wheezing, no rhonchi Abdominal: Soft, NT, ND, bowel sounds + Extremities: no edema, no cyanosis    The  results of significant diagnostics from this hospitalization (including imaging, microbiology, ancillary and laboratory) are listed below for reference.     Microbiology: Recent Results (from the past 240 hour(s))  Blood Culture (routine x 2)     Status: None (Preliminary result)   Collection Time: 08/29/20  2:58 PM   Specimen: BLOOD  Result Value Ref Range Status   Specimen Description   Final    BLOOD RIGHT ANTECUBITAL Performed at [facility] [address] [place], [place] [address]    Special Requests   Final    BOTTLES DRAWN AEROBIC AND ANAEROBIC Blood Culture adequate volume Performed at [facility] [address], [place], [place] [address]    Culture   Final    NO GROWTH 3 DAYS Performed at [facility], [place] [address] [place], [place] [address]    Report Status PENDING  Incomplete  Blood Culture (routine x 2)  Status: None (Preliminary result)   Collection Time: 08/29/20  3:03 PM   Specimen: BLOOD  Result Value Ref Range Status   Specimen Description   Final    BLOOD LEFT WRIST Performed at [facility] [address], [place], [place] [address]    Special Requests   Final    BOTTLES DRAWN AEROBIC ONLY Blood Culture results may not be optimal due to an inadequate volume of blood received in culture bottles Performed at [facility] [address], [place], [place] [address]    Culture   Final    NO GROWTH 3 DAYS Performed at [facility], [place] [address], [place], [place] [address]    Report Status PENDING  Incomplete  Resp Panel by RT-PCR (Flu A&B, Covid) Nasopharyngeal Swab     Status: None   Collection Time: 08/29/20  3:14 PM   Specimen: Nasopharyngeal Swab; Nasopharyngeal(NP) swabs in vial transport medium  Result Value Ref Range Status   SARS Coronavirus 2 by RT PCR NEGATIVE NEGATIVE Final    Comment: (NOTE) SARS-CoV-2 target nucleic acids are NOT  DETECTED.  The SARS-CoV-2 RNA is generally detectable in upper respiratory specimens during the acute phase of infection. The lowest concentration of SARS-CoV-2 viral copies this assay can detect is 138 copies/mL. A negative result does not preclude SARS-Cov-2 infection and should not be used as the sole basis for treatment or other patient management decisions. A negative result may occur with  improper specimen collection/handling, submission of specimen other than nasopharyngeal swab, presence of viral mutation(s) within the areas targeted by this assay, and inadequate number of viral copies(<138 copies/mL). A negative result must be combined with clinical observations, patient history, and epidemiological information. The expected result is Negative.  Fact Sheet for Patients:  EntrepreneurPulse.com.au  Fact Sheet for Healthcare Providers:  IncredibleEmployment.be  This test is no t yet approved or cleared by the [place] FDA and  has been authorized for detection and/or diagnosis of SARS-CoV-2 by FDA under an Emergency Use Authorization (EUA). This EUA will remain  in effect (meaning this test can be used) for the duration of the COVID-19 declaration under Section 564(b)(1) of the Act, 21 U.S.C.section 360bbb-3(b)(1), unless the authorization is terminated  or revoked sooner.       Influenza A by PCR NEGATIVE NEGATIVE Final   Influenza B by PCR NEGATIVE NEGATIVE Final    Comment: (NOTE) The Xpert Xpress SARS-CoV-2/FLU/RSV plus assay is intended as an aid in the diagnosis of influenza from Nasopharyngeal swab specimens and should not be used as a sole basis for treatment. Nasal washings and aspirates are unacceptable for Xpert Xpress SARS-CoV-2/FLU/RSV testing.  Fact Sheet for Patients: EntrepreneurPulse.com.au  Fact Sheet for Healthcare Providers: IncredibleEmployment.be  This test is not yet  approved or cleared by the [place] FDA and has been authorized for detection and/or diagnosis of SARS-CoV-2 by FDA under an Emergency Use Authorization (EUA). This EUA will remain in effect (meaning this test can be used) for the duration of the COVID-19 declaration under Section 564(b)(1) of the Act, 21 U.S.C. section 360bbb-3(b)(1), unless the authorization is terminated or revoked.  Performed at [facility], [place] [address], [place],  [address]   Urine culture     Status: None   Collection Time: 08/29/20  6:21 PM   Specimen: In/Out Cath Urine  Result Value Ref Range Status   Specimen Description   Final    IN/OUT CATH URINE Performed at  [facility], [place] [address], [place], [place] [address]    Special Requests   Final    NONE Performed at [facility], [place] [address], [place], [place] [address]    Culture   Final    NO GROWTH Performed at [facility], [place] [address] [place], [place] [address]    Report Status 08/31/2020 FINAL  Final  Gastrointestinal Panel by PCR , Stool     Status: None   Collection Time: 08/31/20 11:00 AM   Specimen: Stool  Result Value Ref Range Status   Campylobacter species NOT DETECTED NOT DETECTED Final   Plesimonas shigelloides NOT DETECTED NOT DETECTED Final   Salmonella species NOT DETECTED NOT DETECTED Final   Yersinia enterocolitica NOT DETECTED NOT DETECTED Final   Vibrio species NOT DETECTED NOT DETECTED Final   Vibrio cholerae NOT DETECTED NOT DETECTED Final   Enteroaggregative E coli (EAEC) NOT DETECTED NOT DETECTED Final   Enteropathogenic E coli (EPEC) NOT DETECTED NOT DETECTED Final   Enterotoxigenic E coli (ETEC) NOT DETECTED NOT DETECTED Final   Shiga like toxin producing E coli (STEC) NOT DETECTED NOT DETECTED Final   Shigella/Enteroinvasive E coli (EIEC) NOT DETECTED NOT DETECTED Final   Cryptosporidium NOT DETECTED NOT DETECTED Final   Cyclospora  cayetanensis NOT DETECTED NOT DETECTED Final   Entamoeba histolytica NOT DETECTED NOT DETECTED Final   Giardia lamblia NOT DETECTED NOT DETECTED Final   Adenovirus F40/41 NOT DETECTED NOT DETECTED Final   Astrovirus NOT DETECTED NOT DETECTED Final   Norovirus GI/GII NOT DETECTED NOT DETECTED Final   Rotavirus A NOT DETECTED NOT DETECTED Final   Sapovirus (I, II, IV, and V) NOT DETECTED NOT DETECTED Final    Comment: Performed at [facility], [place]., [place], [place] [address]  C Difficile Quick Screen w PCR reflex     Status: None   Collection Time: 08/31/20 11:00 AM   Specimen: STOOL  Result Value Ref Range Status   C Diff antigen NEGATIVE NEGATIVE Final   C Diff toxin NEGATIVE NEGATIVE Final   C Diff interpretation No C. difficile detected.  Final    Comment: NEGATIVE Performed at [facility], [place] [address], [place],  [address]      Labs: BNP (last 3 results) No results for input(s): BNP in the last 8760 hours. Basic Metabolic Panel: Recent Labs  Lab 08/29/20 1821 08/29/20 2100 08/30/20 0732 08/30/20 1352 08/31/20 0250 09/01/20 0912  NA  --  134* 143 141 140 137  K  --  3.3* 2.8* 3.1* 3.6 3.0*  CL  --  107 115* 112* 110 107  CO2  --  13* 18* 19* 19* 19*  GLUCOSE  --  96 81 113* 128* 131*  BUN  --  59* 48* 31* 22* 23*  CREATININE 6.32* 5.02* 2.74* 1.73* 1.05* 1.14*  CALCIUM  --  6.9* 7.6* 7.6* 7.9* 8.4*  MG 1.3*  --   --   --  1.7 2.2  PHOS 6.8*  --   --   --   --   --    Liver Function Tests: Recent Labs  Lab 08/29/20 1458 08/31/20 0250  AST 19 13*  ALT 19 13  ALKPHOS 73 49  BILITOT 0.4 0.4  PROT 8.8* 5.9*  ALBUMIN 3.7 2.7*   No results for input(s): LIPASE, AMYLASE in the last 168 hours. No results for input(s): AMMONIA in the last 168 hours. CBC: Recent Labs  Lab 08/29/20 1458 08/29/20 1821 08/29/20 1908 08/30/20 0732 08/31/20 0250 09/01/20 0912  WBC 25.8* 17.2*  --  12.3* 7.0 10.3  NEUTROABS 22.6*   --   --  8.6*  --   --   HGB 9.8* 6.3* 6.3* 10.4* 9.0* 9.7*  HCT 32.6* 21.2* 21.2* 32.1* 28.1* 30.6*  MCV 84.7 86.2  --  82.7 84.1 82.9  PLT 717* 401*  --  470* 334 406*   Cardiac Enzymes: No results for input(s): CKTOTAL, CKMB, CKMBINDEX, TROPONINI in the last 168 hours. BNP: Invalid input(s): POCBNP CBG: Recent Labs  Lab 08/29/20 1448  GLUCAP 107*   D-Dimer No results for input(s): DDIMER in the last 72 hours. Hgb A1c Recent Labs    08/31/20 0250  HGBA1C 5.5   Lipid Profile No results for input(s): CHOL, HDL, LDLCALC, TRIG, CHOLHDL, LDLDIRECT in the last 72 hours. Thyroid function studies No results for input(s): TSH, T4TOTAL, T3FREE, THYROIDAB in the last 72 hours.  Invalid input(s): FREET3 Anemia work up Recent Labs    08/30/20 1024  VITAMINB12 320  FOLATE 18.8  FERRITIN 13  TIBC 300  IRON 159  RETICCTPCT 1.1   Urinalysis    Component Value Date/Time   COLORURINE YELLOW 08/29/2020 1821   APPEARANCEUR CLOUDY (A) 08/29/2020 1821   LABSPEC 1.016 08/29/2020 1821   PHURINE 5.0 08/29/2020 1821   GLUCOSEU NEGATIVE 08/29/2020 1821   HGBUR NEGATIVE 08/29/2020 1821   BILIRUBINUR NEGATIVE 08/29/2020 1821   BILIRUBINUR Neg 02/25/2014 0858   KETONESUR NEGATIVE 08/29/2020 1821   PROTEINUR 100 (A) 08/29/2020 1821   UROBILINOGEN 0.2 02/25/2014 0858   UROBILINOGEN 0.2 05/20/2009 0310   NITRITE NEGATIVE 08/29/2020 1821   LEUKOCYTESUR SMALL (A) 08/29/2020 1821   Sepsis Labs Invalid input(s): PROCALCITONIN,  WBC,  LACTICIDVEN Microbiology Recent Results (from the past 240 hour(s))  Blood Culture (routine x 2)     Status: None (Preliminary result)   Collection Time: 08/29/20  2:58 PM   Specimen: BLOOD  Result Value Ref Range Status   Specimen Description   Final    BLOOD RIGHT ANTECUBITAL Performed at [facility], [place] [address], [place], [place] [address]    Special Requests   Final    BOTTLES DRAWN AEROBIC AND ANAEROBIC Blood Culture  adequate volume Performed at [facility] [address], [place], [place] [address]    Culture   Final    NO GROWTH 3 DAYS Performed at [facility], [place] [address], [place], [place] [address]    Report Status PENDING  Incomplete  Blood Culture (routine x 2)     Status: None (Preliminary result)   Collection Time: 08/29/20  3:03 PM   Specimen: BLOOD  Result Value Ref Range Status   Specimen Description   Final    BLOOD LEFT WRIST Performed at [facility] [address], [place], [place] [address]    Special Requests   Final    BOTTLES DRAWN AEROBIC ONLY Blood Culture results may not be optimal due to an inadequate volume of blood received in culture bottles Performed at [facility] [address] [place], [place] [address]    Culture   Final    NO GROWTH 3 DAYS Performed at [facility], [place] [address], [place], [place] [address]    Report Status PENDING  Incomplete  Resp Panel by RT-PCR (Flu A&B, Covid) Nasopharyngeal Swab     Status: None   Collection Time:  08/29/20  3:14 PM   Specimen: Nasopharyngeal Swab; Nasopharyngeal(NP) swabs in vial transport medium  Result Value Ref Range Status   SARS Coronavirus 2 by RT PCR NEGATIVE NEGATIVE Final    Comment: (NOTE) SARS-CoV-2 target nucleic acids are NOT DETECTED.  The SARS-CoV-2 RNA is generally detectable in upper respiratory specimens during the acute phase of infection. The lowest concentration of SARS-CoV-2 viral copies this assay can detect is 138 copies/mL. A negative result does not preclude SARS-Cov-2 infection and should not be used as the sole basis for treatment or other patient management decisions. A negative result may occur with  improper specimen collection/handling, submission of specimen other than nasopharyngeal swab, presence of viral mutation(s) within the areas targeted by this assay, and inadequate number of viral copies(<138  copies/mL). A negative result must be combined with clinical observations, patient history, and epidemiological information. The expected result is Negative.  Fact Sheet for Patients:  EntrepreneurPulse.com.au  Fact Sheet for Healthcare Providers:  IncredibleEmployment.be  This test is no t yet approved or cleared by the [place] FDA and  has been authorized for detection and/or diagnosis of SARS-CoV-2 by FDA under an Emergency Use Authorization (EUA). This EUA will remain  in effect (meaning this test can be used) for the duration of the COVID-19 declaration under Section 564(b)(1) of the Act, 21 U.S.C.section 360bbb-3(b)(1), unless the authorization is terminated  or revoked sooner.       Influenza A by PCR NEGATIVE NEGATIVE Final   Influenza B by PCR NEGATIVE NEGATIVE Final    Comment: (NOTE) The Xpert Xpress SARS-CoV-2/FLU/RSV plus assay is intended as an aid in the diagnosis of influenza from Nasopharyngeal swab specimens and should not be used as a sole basis for treatment. Nasal washings and aspirates are unacceptable for Xpert Xpress SARS-CoV-2/FLU/RSV testing.  Fact Sheet for Patients: EntrepreneurPulse.com.au  Fact Sheet for Healthcare Providers: IncredibleEmployment.be  This test is not yet approved or cleared by the [place] FDA and has been authorized for detection and/or diagnosis of SARS-CoV-2 by FDA under an Emergency Use Authorization (EUA). This EUA will remain in effect (meaning this test can be used) for the duration of the COVID-19 declaration under Section 564(b)(1) of the Act, 21 U.S.C. section 360bbb-3(b)(1), unless the authorization is terminated or revoked.  Performed at [facility], [place] [address] [place], [place] [address]   Urine culture     Status: None   Collection Time: 08/29/20  6:21 PM   Specimen: In/Out Cath Urine  Result Value Ref  Range Status   Specimen Description   Final    IN/OUT CATH URINE Performed at [facility] [address], [place], [place] [address]    Special Requests   Final    NONE Performed at [facility], [place] [address] [place], [place] [address]    Culture   Final    NO GROWTH Performed at [facility], [place] [address], [place], [place] [address]    Report Status 08/31/2020 FINAL  Final  Gastrointestinal Panel by PCR , Stool     Status: None   Collection Time: 08/31/20 11:00 AM   Specimen: Stool  Result Value Ref Range Status   Campylobacter species NOT DETECTED NOT DETECTED Final   Plesimonas shigelloides NOT DETECTED NOT DETECTED Final   Salmonella species NOT DETECTED NOT DETECTED Final   Yersinia enterocolitica NOT DETECTED NOT DETECTED Final   Vibrio species NOT DETECTED NOT DETECTED Final   Vibrio cholerae  NOT DETECTED NOT DETECTED Final   Enteroaggregative E coli (EAEC) NOT DETECTED NOT DETECTED Final   Enteropathogenic E coli (EPEC) NOT DETECTED NOT DETECTED Final   Enterotoxigenic E coli (ETEC) NOT DETECTED NOT DETECTED Final   Shiga like toxin producing E coli (STEC) NOT DETECTED NOT DETECTED Final   Shigella/Enteroinvasive E coli (EIEC) NOT DETECTED NOT DETECTED Final   Cryptosporidium NOT DETECTED NOT DETECTED Final   Cyclospora cayetanensis NOT DETECTED NOT DETECTED Final   Entamoeba histolytica NOT DETECTED NOT DETECTED Final   Giardia lamblia NOT DETECTED NOT DETECTED Final   Adenovirus F40/41 NOT DETECTED NOT DETECTED Final   Astrovirus NOT DETECTED NOT DETECTED Final   Norovirus GI/GII NOT DETECTED NOT DETECTED Final   Rotavirus A NOT DETECTED NOT DETECTED Final   Sapovirus (I, II, IV, and V) NOT DETECTED NOT DETECTED Final    Comment: Performed at [facility], [address] [place], [place] [address]  C Difficile Quick Screen w PCR reflex     Status: None   Collection Time: 08/31/20 11:00 AM   Specimen:  STOOL  Result Value Ref Range Status   C Diff antigen NEGATIVE NEGATIVE Final   C Diff toxin NEGATIVE NEGATIVE Final   C Diff interpretation No C. difficile detected.  Final    Comment: NEGATIVE Performed at [facility], [address] [place], [place] [address]      Time coordinating discharge: 35 minutes  SIGNED:   Jacquelin Hawking, MD [facility] 09/02/2020, 10:19 AM

## 2020-09-02 NOTE — Discharge Instructions (Signed)
[place],  You were in the hospital because of low blood pressure and dehydration. You required ICU admission for IV fluids and medication to keep your blood pressure up. You have improved. 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 You have been started on steroids. Please follow-up with an endocrinologist.

## 2020-09-03 NOTE — Progress Notes (Signed)
Patient called stating she had not received her medication due to it not being called into the pharmacy.  She stated she spoke with pharmacy earlier and they did not have it.  I called and spoke with Dr. Lonny Prude, he called to confirm that pharmacy had received prescription, medication was there for patient.  I called patient back, no answer, left voicemail.

## 2020-09-04 LAB — CULTURE, BLOOD (ROUTINE X 2)
Culture: NO GROWTH
Culture: NO GROWTH
Special Requests: ADEQUATE

## 2020-09-08 ENCOUNTER — Other Ambulatory Visit: Payer: Self-pay

## 2020-09-10 ENCOUNTER — Ambulatory Visit: Payer: Commercial Managed Care - PPO | Admitting: Endocrinology

## 2020-10-26 ENCOUNTER — Other Ambulatory Visit: Payer: Self-pay

## 2020-10-26 ENCOUNTER — Ambulatory Visit (INDEPENDENT_AMBULATORY_CARE_PROVIDER_SITE_OTHER): Payer: Commercial Managed Care - PPO | Admitting: Endocrinology

## 2020-10-26 VITALS — BP 122/80 | HR 83 | Ht 64.0 in | Wt 133.8 lb

## 2020-10-26 DIAGNOSIS — I9589 Other hypotension: Secondary | ICD-10-CM | POA: Diagnosis not present

## 2020-10-26 DIAGNOSIS — E861 Hypovolemia: Secondary | ICD-10-CM | POA: Diagnosis not present

## 2020-10-26 DIAGNOSIS — E041 Nontoxic single thyroid nodule: Secondary | ICD-10-CM | POA: Diagnosis not present

## 2020-10-26 MED ORDER — PREDNISONE 1 MG PO TABS: 3.0000 mg | ORAL_TABLET | Freq: Every day | ORAL | 3 refills | Status: DC

## 2020-10-26 MED ORDER — FLUDROCORTISONE ACETATE 0.1 MG PO TABS: 0.1000 mg | ORAL_TABLET | Freq: Every day | ORAL | 1 refills | Status: DC

## 2020-10-26 NOTE — Patient Instructions (Addendum)
Let's check the ultrasound.  you will receive a phone call, about a day and time for an appointment. I have sent 2 prescriptions to your pharmacy: prednisone and fludrocortisone. Every 2-3 weeks, reduce the prednisone by 1 mg, until you are off. Please do not reduce the fludrocortisone.   Monitor your blood pressure, to make sure your blood pressure does not go low.   Please ask that your primary care provider send [name] the TSH and BMET results, when you are there in a few weeks.  Please come back for a follow-up appointment in 2 months, when we'll plan to do the ACTH stimulation test.

## 2020-10-26 NOTE — Progress Notes (Signed)
Subjective:    Patient ID: Emily Park, female    DOB: Jun 04, 1971, 50 y.o.   MRN: [id number]  HPI Pt is referred by Dr Lonny Prude, for Addison's Disease.  Pt was noted to have low cortisol level in early 2022.  no h/o abdominal or brain injury.  No h/o cancer, thyroid problems, seizures, hypoglycemia, amyloidosis, tuberculosis, or diabetes.  No h/o ketoconazole, rifampin, or dilantin.  She takes HC 15 mg qam and 7.5 mg qpm.  On this, hypotension is better, but she still has insomnia, anxiety, depression, fatigue, lightheadedness, difficulty with concentration, weight gain (10 lbs x 1 month).   Past Medical History:  Diagnosis Date  . Abnormal TSH   . ADD (attention deficit disorder)   . Anemia   . Anxiety   . Arrhythmia   . Decreased libido   . Depression   . Heart murmur   . Hyperlipidemia   . Hypokalemia   . Migraine   . Pap smear abnormality of cervix/human papillomavirus (HPV) positive   . Sleep difficulties   . SVT (supraventricular tachycardia) (HCC)     Past Surgical History:  Procedure Laterality Date  . ABLATION     Cardiac ablation, endometrial ablation  . BREAST LUMPECTOMY     right breast  . BREAST LUMPECTOMY Right   . CARDIOVERSION    . COLPOSCOPY     w/ cervical biopsy  . DILITATION & CURRETTAGE/HYSTROSCOPY WITH NOVASURE ABLATION N/A 09/11/2014   Procedure: DILATATION & CURETTAGE/HYSTEROSCOPY WITH NOVASURE ABLATION;  Surgeon: Lovenia Kim, MD;  Location: [facility];  Service: Gynecology;  Laterality: N/A;    Social History   Socioeconomic History  . Marital status: Legally Separated    Spouse name: Not on file  . Number of children: 2  . Years of education: college  . Highest education level: Not on file  Occupational History  . 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 Smoking status: Never Smoker  . Smokeless tobacco: Never Used  Substance and Sexual Activity  . Alcohol use: Yes    Comment: occasional wine  . Drug use: Not  Currently  . Sexual activity: Yes    Partners: Male    Comment: Married  Other Topics Concern  . Not on file  Social History Narrative   Lives at home with two sons.   Right-handed.   60 ounces of tea and soda per day.   Social Determinants of Health   Financial Resource Strain: Not on file  Food Insecurity: Not on file  Transportation Needs: Not on file  Physical Activity: Not on file  Stress: Not on file  Social Connections: Not on file  Intimate Partner Violence: Not on file    Current Outpatient Medications on File Prior to Visit  Medication Sig Dispense Refill  . amphetamine-dextroamphetamine (ADDERALL XR) 30 MG 24 hr capsule Take 30 mg by mouth daily as needed (ADHD).    Marland Kitchen BIOTIN PO Take 1 tablet by mouth daily.    . clonazePAM (KLONOPIN) 0.5 MG tablet Take 0.25 mg by mouth 2 (two) times daily as needed for anxiety.    Marland Kitchen FLUoxetine (PROZAC) 40 MG capsule Take 40 mg by mouth daily.    . Multiple Vitamin (MULTIVITAMIN PO) Take 1 tablet by mouth daily.    . ondansetron (ZOFRAN ODT) 4 MG disintegrating tablet Take 1 tablet (4 mg total) by mouth every 8 (eight) hours as needed. (Patient taking differently: Take 4 mg by  mouth every 8 (eight) hours as needed for vomiting or nausea.) 20 tablet 6  . topiramate (TOPAMAX) 100 MG tablet Take 1.5 tablets (150 mg total) by mouth 2 (two) times daily. 60 tablet 11   No current facility-administered medications on file prior to visit.    Allergies  Allergen Reactions  . Amoxicillin Rash  . Bactrim [Sulfamethoxazole-Trimethoprim] Rash    Rash noted to chest and back  . Zithromax [Azithromycin] Rash    Family History  Problem Relation Age of Onset  . Hypothyroidism Mother   . Depression Mother   . Cancer Mother        thyroid  . Hypertension Father   . Heart attack Father        Age 57  . AAA (abdominal aortic aneurysm) Father   . Hypothyroidism Sister   . Hyperthyroidism Sister   . Diabetes Maternal Grandfather   . Breast  cancer Paternal Grandmother     BP 122/80 (BP Location: Right Arm, Patient Position: Sitting, Cuff Size: Normal)   Pulse 83   Ht 5\' 4"  (1.626 m)   Wt 133 lb 12.8 oz (60.7 kg)   SpO2 98%   BMI 22.97 kg/m     Review of Systems Denies n/v, abd pain, vitiligo, and change in skin tone.  She has alternating C and D, and cold intolerance    Objective:   Physical Exam VS: see vs page GEN: no distress HEAD: head: no deformity eyes: no periorbital swelling, no proptosis external nose and ears are normal NECK: 1-2 cm RUP thyroid nodule is noted.  Freely mobile CHEST WALL: no deformity LUNGS: clear to auscultation CV: reg rate and rhythm, no murmur.  MUSCULOSKELETAL: gait is normal and steady EXTEMITIES: no deformity.  no leg edema NEURO:  readily moves all 4's.  sensation is intact to touch on all 4's SKIN:  Normal texture and temperature.  No rash or suspicious lesion is visible.   NODES:  None palpable at the neck PSYCH: alert, well-oriented.  Does not appear anxious nor depressed.  CT (2021): normal adrenals  I have reviewed outside records, and summarized: Pt was noted to have low cortisol, and referred here.  She was in hosp 1/22.  She also had normal ACTH stim test in 2009.     Assessment & Plan:  Thyroid nodule, new, uncertain etiology and prognosis Hypocortisolism.  Uncertain if she has addison's Dz.  Patient Instructions  Let's check the ultrasound.  you will receive a phone call, about a day and time for an appointment. I have sent 2 prescriptions to your pharmacy: prednisone and fludrocortisone. Every 2-3 weeks, reduce the prednisone by 1 mg, until you are off. Please do not reduce the fludrocortisone.   Monitor your blood pressure, to make sure your blood pressure does not go low.   Please ask that your primary care provider send [name] the TSH and BMET results, when you are there in a few weeks.  Please come back for a follow-up appointment in 2 months, when we'll plan  to do the ACTH stimulation test.

## 2020-11-22 ENCOUNTER — Ambulatory Visit (HOSPITAL_BASED_OUTPATIENT_CLINIC_OR_DEPARTMENT_OTHER)
Admission: RE | Admit: 2020-11-22 | Discharge: 2020-11-22 | Disposition: A | Discharge status: Home / Self Care | Payer: Commercial Managed Care - PPO | Source: Ambulatory Visit | Attending: Endocrinology | Admitting: Endocrinology

## 2020-11-22 ENCOUNTER — Other Ambulatory Visit: Payer: Self-pay

## 2020-11-22 DIAGNOSIS — E041 Nontoxic single thyroid nodule: Secondary | ICD-10-CM | POA: Insufficient documentation

## 2020-12-30 ENCOUNTER — Other Ambulatory Visit: Payer: Self-pay

## 2020-12-30 ENCOUNTER — Ambulatory Visit (INDEPENDENT_AMBULATORY_CARE_PROVIDER_SITE_OTHER): Payer: Commercial Managed Care - PPO | Admitting: Endocrinology

## 2020-12-30 VITALS — BP 120/70 | HR 98 | Ht 64.0 in | Wt 139.0 lb

## 2020-12-30 DIAGNOSIS — E861 Hypovolemia: Secondary | ICD-10-CM

## 2020-12-30 DIAGNOSIS — E041 Nontoxic single thyroid nodule: Secondary | ICD-10-CM | POA: Diagnosis not present

## 2020-12-30 DIAGNOSIS — I9589 Other hypotension: Secondary | ICD-10-CM | POA: Diagnosis not present

## 2020-12-30 NOTE — Patient Instructions (Addendum)
Blood tests are requested for you today.  We'll let you know about the results.  If the thyroid is not overactive, please come back soon for the biopsy.

## 2020-12-30 NOTE — Progress Notes (Signed)
Subjective:    Patient ID: Emily Park, female    DOB: Apr 07, 1971, 50 y.o.   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  She is off prednisone and florinef.  pt states she feels well in general.  She also has thyroid nodule (dx'ed 2022; US showed 1.3 cm right thyroid nodule meets criteria for fine-needle aspiration).   Past Medical History:  Diagnosis Date  . Abnormal TSH   . ADD (attention deficit disorder)   . Anemia   . Anxiety   . Arrhythmia   . Decreased libido   . Depression   . Heart murmur   . Hyperlipidemia   . Hypokalemia   . Migraine   . Pap smear abnormality of cervix/human papillomavirus (HPV) positive   . Sleep difficulties   . SVT (supraventricular tachycardia) (HCC)     Past Surgical History:  Procedure Laterality Date  . ABLATION     Cardiac ablation, endometrial ablation  . BREAST LUMPECTOMY     right breast  . BREAST LUMPECTOMY Right   . CARDIOVERSION    . COLPOSCOPY     w/ cervical biopsy  . DILITATION & CURRETTAGE/HYSTROSCOPY WITH NOVASURE ABLATION N/A 09/11/2014   Procedure: DILATATION & CURETTAGE/HYSTEROSCOPY WITH NOVASURE ABLATION;  Surgeon: Lovenia Kim, MD;  Location: [facility];  Service: Gynecology;  Laterality: N/A;    Social History   Socioeconomic History  . Marital status: Legally Separated    Spouse name: Not on file  . Number of children: 2  . Years of education: college  . Highest education level: Not on file  Occupational History  . 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 Smoking status: Never Smoker  . Smokeless tobacco: Never Used  Substance and Sexual Activity  . Alcohol use: Yes    Comment: occasional wine  . Drug use: Not Currently  . Sexual activity: Yes    Partners: Male    Comment: Married  Other Topics Concern  . Not on file  Social History Narrative   Lives at home with two sons.   Right-handed.   60 ounces of tea and soda per day.   Social  Determinants of Health   Financial Resource Strain: Not on file  Food Insecurity: Not on file  Transportation Needs: Not on file  Physical Activity: Not on file  Stress: Not on file  Social Connections: Not on file  Intimate Partner Violence: Not on file    Current Outpatient Medications on File Prior to Visit  Medication Sig Dispense Refill  . amphetamine-dextroamphetamine (ADDERALL XR) 30 MG 24 hr capsule Take 30 mg by mouth daily as needed (ADHD).    Marland Kitchen BIOTIN PO Take 1 tablet by mouth daily.    . clonazePAM (KLONOPIN) 0.5 MG tablet Take 0.25 mg by mouth 2 (two) times daily as needed for anxiety.    Marland Kitchen FLUoxetine (PROZAC) 40 MG capsule Take 40 mg by mouth daily.    . Multiple Vitamin (MULTIVITAMIN PO) Take 1 tablet by mouth daily.    . ondansetron (ZOFRAN ODT) 4 MG disintegrating tablet Take 1 tablet (4 mg total) by mouth every 8 (eight) hours as needed. (Patient taking differently: Take 4 mg by mouth every 8 (eight) hours as needed for vomiting or nausea.) 20 tablet 6  . topiramate (TOPAMAX) 100 MG tablet Take 1.5 tablets (150 mg total) by mouth 2 (two) times daily. 60 tablet 11   No  current facility-administered medications on file prior to visit.    Allergies  Allergen Reactions  . Amoxicillin Rash  . Bactrim [Sulfamethoxazole-Trimethoprim] Rash    Rash noted to chest and back  . Zithromax [Azithromycin] Rash    Family History  Problem Relation Age of Onset  . Hypothyroidism Mother   . Depression Mother   . Cancer Mother        thyroid  . Hypertension Father   . Heart attack Father        Age 53  . AAA (abdominal aortic aneurysm) Father   . Hypothyroidism Sister   . Hyperthyroidism Sister   . Diabetes Maternal Grandfather   . Breast cancer Paternal Grandmother     BP 120/70 (BP Location: Right Arm, Patient Position: Sitting, Cuff Size: Normal)   Pulse 98   Ht 5\' 4"  (1.626 m)   Wt 139 lb (63 kg)   SpO2 99%   BMI 23.86 kg/m      Review of Systems Denies  dizziness and LOC.      Objective:   Physical Exam VITAL SIGNS:  See vs page GENERAL: no distress Ext: no leg edema.    Lab Results  Component Value Date   TSH 0.26 (L) 12/30/2020   ACTH stimulation test is done: baseline cortisol level=3 then Cosyntropin 250 mcg is given im 45 minutes later, cortisol level=18 (normal response)    Assessment & Plan:  Thyroid nodule Hyperthyroidism, prob due to above.  Check nuc med scan Hypocortisolism.  Adrenal insuff is excluded.  Please sty off meds

## 2020-12-31 LAB — BASIC METABOLIC PANEL
BUN: 9 mg/dL (ref 6–23)
CO2: 26 mEq/L (ref 19–32)
Calcium: 8.7 mg/dL (ref 8.4–10.5)
Chloride: 105 mEq/L (ref 96–112)
Creatinine, Ser: 1.02 mg/dL (ref 0.40–1.20)
GFR: 63.83 mL/min (ref >60.00)
Glucose, Bld: 93 mg/dL (ref 70–99)
Potassium: 3.1 mEq/L — ABNORMAL LOW (ref 3.5–5.1)
Sodium: 138 mEq/L (ref 135–145)

## 2020-12-31 LAB — T4, FREE: Free T4: 0.92 ng/dL (ref 0.60–1.60)

## 2020-12-31 LAB — CORTISOL
Cortisol, Plasma: 3 ug/dL
Cortisol, Plasma: 17.8 ug/dL

## 2020-12-31 LAB — TSH: TSH: 0.26 u[IU]/mL — ABNORMAL LOW (ref 0.35–4.50)

## 2020-12-31 MED ORDER — COSYNTROPIN 0.25 MG IJ SOLR
0.2500 mg | Freq: Once | INTRAMUSCULAR | Status: AC
  Administered 2020-12-30: 0.25 mg via INTRAVENOUS

## 2020-12-31 NOTE — Progress Notes (Signed)
Late entry from 12/30/2020: Cosyntropin administered to pt's right arm at 4:15

## 2021-01-02 LAB — ACTH: C206 ACTH: 18 pg/mL (ref 6–50)

## 2021-01-04 ENCOUNTER — Encounter: Payer: Self-pay | Admitting: Endocrinology

## 2021-01-04 ENCOUNTER — Telehealth: Payer: Self-pay | Admitting: Endocrinology

## 2021-01-04 NOTE — Telephone Encounter (Signed)
Patient would like to know all the details and reason to why she has to do the Uptake Scan. Asked if a nurse could give her a call back to explain. Patient ph# [phone]

## 2021-01-05 NOTE — Telephone Encounter (Signed)
The rean for the nuclear medicine scan is to see if the nodule is overactive (if so, cancer is so unlikely that you would not need the biopsy).  You don't have to do the test, but I would if I was you.

## 2021-01-11 NOTE — Telephone Encounter (Signed)
Message sent thru MyChart 

## 2021-01-13 ENCOUNTER — Other Ambulatory Visit: Payer: Self-pay | Admitting: Endocrinology

## 2021-01-16 ENCOUNTER — Encounter: Payer: Self-pay | Admitting: Endocrinology

## 2021-01-31 ENCOUNTER — Encounter (HOSPITAL_COMMUNITY): Payer: Self-pay

## 2021-01-31 ENCOUNTER — Ambulatory Visit (HOSPITAL_COMMUNITY): Admission: RE | Admit: 2021-01-31 | Payer: Self-pay | Source: Ambulatory Visit

## 2021-02-01 ENCOUNTER — Ambulatory Visit (HOSPITAL_COMMUNITY): Payer: Self-pay

## 2021-02-15 ENCOUNTER — Encounter (HOSPITAL_BASED_OUTPATIENT_CLINIC_OR_DEPARTMENT_OTHER): Payer: Self-pay | Admitting: Obstetrics and Gynecology

## 2021-02-15 ENCOUNTER — Other Ambulatory Visit: Payer: Self-pay

## 2021-02-15 ENCOUNTER — Emergency Department (HOSPITAL_BASED_OUTPATIENT_CLINIC_OR_DEPARTMENT_OTHER)
Admission: EM | Admit: 2021-02-15 | Discharge: 2021-02-15 | Disposition: A | Discharge status: Home / Self Care | Payer: 59 | Attending: Emergency Medicine | Admitting: Emergency Medicine

## 2021-02-15 DIAGNOSIS — R531 Weakness: Secondary | ICD-10-CM | POA: Insufficient documentation

## 2021-02-15 DIAGNOSIS — R197 Diarrhea, unspecified: Secondary | ICD-10-CM | POA: Insufficient documentation

## 2021-02-15 DIAGNOSIS — Z9011 Acquired absence of right breast and nipple: Secondary | ICD-10-CM | POA: Diagnosis not present

## 2021-02-15 DIAGNOSIS — R109 Unspecified abdominal pain: Secondary | ICD-10-CM | POA: Insufficient documentation

## 2021-02-15 DIAGNOSIS — R112 Nausea with vomiting, unspecified: Secondary | ICD-10-CM | POA: Diagnosis present

## 2021-02-15 DIAGNOSIS — F17200 Nicotine dependence, unspecified, uncomplicated: Secondary | ICD-10-CM | POA: Diagnosis not present

## 2021-02-15 LAB — COMPREHENSIVE METABOLIC PANEL
ALT: 12 U/L (ref 0–44)
AST: 17 U/L (ref 15–41)
Albumin: 4.4 g/dL (ref 3.5–5.0)
Alkaline Phosphatase: 56 U/L (ref 38–126)
Anion gap: 10 (ref 5–15)
BUN: 11 mg/dL (ref 6–20)
CO2: 23 mmol/L (ref 22–32)
Calcium: 9.5 mg/dL (ref 8.9–10.3)
Chloride: 104 mmol/L (ref 98–111)
Creatinine, Ser: 1.02 mg/dL — ABNORMAL HIGH (ref 0.44–1.00)
GFR, Estimated: >60 mL/min (ref >60)
Glucose, Bld: 78 mg/dL (ref 70–99)
Potassium: 2.6 mmol/L — CL (ref 3.5–5.1)
Sodium: 137 mmol/L (ref 135–145)
Total Bilirubin: 0.2 mg/dL — ABNORMAL LOW (ref 0.3–1.2)
Total Protein: 7.4 g/dL (ref 6.5–8.1)

## 2021-02-15 LAB — CBC
HCT: 35.7 % — ABNORMAL LOW (ref 36.0–46.0)
Hemoglobin: 11 g/dL — ABNORMAL LOW (ref 12.0–15.0)
MCH: 26.8 pg (ref 26.0–34.0)
MCHC: 30.8 g/dL (ref 30.0–36.0)
MCV: 86.9 fL (ref 80.0–100.0)
Platelets: 488 10*3/uL — ABNORMAL HIGH (ref 150–400)
RBC: 4.11 MIL/uL (ref 3.87–5.11)
RDW: 16.2 % — ABNORMAL HIGH (ref 11.5–15.5)
WBC: 9 10*3/uL (ref 4.0–10.5)
nRBC: 0 % (ref 0.0–0.2)

## 2021-02-15 LAB — PREGNANCY, URINE: Preg Test, Ur: NEGATIVE

## 2021-02-15 LAB — URINALYSIS, ROUTINE W REFLEX MICROSCOPIC
Bilirubin Urine: NEGATIVE
Glucose, UA: NEGATIVE mg/dL
Hgb urine dipstick: NEGATIVE
Ketones, ur: NEGATIVE mg/dL
Leukocytes,Ua: NEGATIVE
Nitrite: NEGATIVE
Protein, ur: NEGATIVE mg/dL
Specific Gravity, Urine: 1.013 (ref 1.005–1.030)
pH: 6 (ref 5.0–8.0)

## 2021-02-15 LAB — MAGNESIUM: Magnesium: 1.8 mg/dL (ref 1.7–2.4)

## 2021-02-15 LAB — LIPASE, BLOOD: Lipase: 125 U/L — ABNORMAL HIGH (ref 11–51)

## 2021-02-15 LAB — CBG MONITORING, ED: Glucose-Capillary: 83 mg/dL (ref 70–99)

## 2021-02-15 MED ORDER — METOCLOPRAMIDE HCL 5 MG/ML IJ SOLN
10.0000 mg | Freq: Once | INTRAMUSCULAR | Status: AC
  Administered 2021-02-15: 10 mg via INTRAVENOUS
  Filled 2021-02-15: qty 2

## 2021-02-15 MED ORDER — POTASSIUM CHLORIDE CRYS ER 20 MEQ PO TBCR: 40.0000 meq | EXTENDED_RELEASE_TABLET | Freq: Every day | ORAL | 0 refills | Status: DC

## 2021-02-15 MED ORDER — MORPHINE SULFATE (PF) 4 MG/ML IV SOLN
4.0000 mg | Freq: Once | INTRAVENOUS | Status: AC
  Administered 2021-02-15: 4 mg via INTRAVENOUS
  Filled 2021-02-15: qty 1

## 2021-02-15 MED ORDER — POTASSIUM CHLORIDE 10 MEQ/100ML IV SOLN
10.0000 meq | INTRAVENOUS | Status: AC
  Administered 2021-02-15 (×3): 10 meq via INTRAVENOUS
  Filled 2021-02-15 (×3): qty 100

## 2021-02-15 MED ORDER — DIPHENHYDRAMINE HCL 50 MG/ML IJ SOLN
25.0000 mg | Freq: Once | INTRAMUSCULAR | Status: AC
  Administered 2021-02-15: 25 mg via INTRAVENOUS
  Filled 2021-02-15: qty 1

## 2021-02-15 MED ORDER — PANTOPRAZOLE SODIUM 20 MG PO TBEC: 20.0000 mg | DELAYED_RELEASE_TABLET | Freq: Every day | ORAL | 0 refills | Status: DC

## 2021-02-15 MED ORDER — ONDANSETRON 4 MG PO TBDP: 4.0000 mg | ORAL_TABLET | ORAL | 0 refills | Status: DC | PRN

## 2021-02-15 MED ORDER — PROMETHAZINE HCL 25 MG PO TABS: 25.0000 mg | ORAL_TABLET | Freq: Four times a day (QID) | ORAL | 0 refills | Status: DC | PRN

## 2021-02-15 MED ORDER — SODIUM CHLORIDE 0.9 % IV BOLUS
1000.0000 mL | Freq: Once | INTRAVENOUS | Status: AC
  Administered 2021-02-15: 1000 mL via INTRAVENOUS

## 2021-02-15 MED ORDER — PROMETHAZINE HCL 25 MG RE SUPP: 25.0000 mg | Freq: Four times a day (QID) | RECTAL | 0 refills | Status: DC | PRN

## 2021-02-15 MED ORDER — POTASSIUM CHLORIDE CRYS ER 20 MEQ PO TBCR
40.0000 meq | EXTENDED_RELEASE_TABLET | Freq: Once | ORAL | Status: AC
  Administered 2021-02-15: 40 meq via ORAL
  Filled 2021-02-15: qty 2

## 2021-02-15 MED ORDER — ALUM & MAG HYDROXIDE-SIMETH 200-200-20 MG/5ML PO SUSP
30.0000 mL | Freq: Once | ORAL | Status: AC
  Administered 2021-02-15: 30 mL via ORAL
  Filled 2021-02-15: qty 30

## 2021-02-15 NOTE — Discharge Instructions (Addendum)
Try to eat a food high in potassium with each meal for the next week.  Follow up with your doctor in the office for recheck in a week.  Return for worsening symptoms, fatigue.

## 2021-02-15 NOTE — ED Triage Notes (Signed)
Patient reports to the ER for weakness. Patient reports she has been vomiting coffee ground emesis.

## 2021-02-15 NOTE — ED Provider Notes (Signed)
Fluids and antiemetics. F/U GI outpatient Physical Exam  BP 103/73 (BP Location: Right Arm)   Pulse 62   Temp 98.2 F (36.8 C) (Oral)   Resp 18   Ht 5' 4.5" (1.638 m)   Wt 54.4 kg   SpO2 100%   BMI 20.28 kg/m   Physical Exam  ED Course/Procedures     Procedures  MDM   Patient has tolerated oral intake.  She has been able to eat grits and take fluids.  Patient completed potassium supplement.  Patient had hypotension while lying supine but blood pressure is actually elevated when she went from sitting to standing.  At this time we will continue with plan to follow-up with gastroenterology.  Prescriptions given for potassium, Protonix, Zofran and Phenergan.      Charlesetta Shanks, MD 02/15/21 2005

## 2021-02-15 NOTE — ED Provider Notes (Signed)
[facility] Provider Note   CSN: [id number] Arrival date & time: 02/15/21  1342     History Chief Complaint  Patient presents with   Weakness    Emily Park is a 50 y.o. female.  50 yo F with a chief complaints of nausea vomiting and diarrhea.  Patient has chronic nausea and vomiting has it at least 2-3 times a week.  She is also been having a few loose stools a couple times a week as well.  Has never seen a gastroenterologist for this.  Back in January she had an episode where she had more persistent nausea and vomiting and diarrhea ended up being found to be hypovolemic and required admission.  She is concerned that she may get that way now.  Had called her family doctor who suggested she come to the ED for evaluation.  She does think that her emesis looks coffee-ground like.  Has had chronic reflux per her.  Has tried over-the-counter medications without significant relief.  The history is provided by the patient and the spouse.  Weakness Associated symptoms: abdominal pain, nausea and vomiting   Associated symptoms: no arthralgias, no chest pain, no dizziness, no dysuria, no fever, no headaches, no myalgias, no shortness of breath and no urgency   Illness Severity:  Moderate Onset quality:  Gradual Duration:  2 days Timing:  Constant Progression:  Worsening Chronicity:  Recurrent Associated symptoms: abdominal pain, nausea and vomiting   Associated symptoms: no chest pain, no congestion, no fever, no headaches, no myalgias, no rhinorrhea, no shortness of breath and no wheezing       Past Medical History:  Diagnosis Date   Abnormal TSH    ADD (attention deficit disorder)    Anemia    Anxiety    Arrhythmia    Decreased libido    Depression    Heart murmur    Hyperlipidemia    Hypokalemia    Migraine    Pap smear abnormality of cervix/human papillomavirus (HPV) positive    Sleep difficulties    SVT (supraventricular tachycardia) (HCC)      Patient Active Problem List   Diagnosis Date Noted   Thyroid nodule 10/26/2020   AKI (acute kidney injury) ([place])    Hypotension    Hypovolemic shock ([place]) 08/29/2020   Migraine 02/27/2019   Lung nodules 11/14/2015   Paroxysmal SVT (supraventricular tachycardia) ([place]) 11/13/2015   Attention deficit hyperactivity disorder (ADHD) 11/13/2015    Past Surgical History:  Procedure Laterality Date   ABLATION     Cardiac ablation, endometrial ablation   BREAST LUMPECTOMY     right breast   BREAST LUMPECTOMY Right    CARDIOVERSION     COLPOSCOPY     w/ cervical biopsy   DILITATION & CURRETTAGE/HYSTROSCOPY WITH NOVASURE ABLATION N/A 09/11/2014   Procedure: DILATATION & CURETTAGE/HYSTEROSCOPY WITH NOVASURE ABLATION;  Surgeon: Lovenia Kim, MD;  Location: [facility];  Service: Gynecology;  Laterality: N/A;     OB History     Gravida      Para      Term      Preterm      AB      Living  2      SAB      IAB      Ectopic      Multiple      Live Births              Family History  Problem Relation Age  of Onset   Hypothyroidism Mother    Depression Mother    Cancer Mother        thyroid   Thyroid disease Mother    Hypertension Father    Heart attack Father        Age 13   AAA (abdominal aortic aneurysm) Father    Hypothyroidism Sister    Hyperthyroidism Sister    Diabetes Maternal Grandfather    Breast cancer Paternal Grandmother     Social History   Tobacco Use   Smoking status: Never    Passive exposure: Current   Smokeless tobacco: Never  Vaping Use   Vaping Use: Never used  Substance Use Topics   Alcohol use: Yes    Alcohol/week: 2.0 standard drinks    Types: 2 Glasses of wine per week    Comment: occasional wine   Drug use: Not Currently    Home Medications Prior to Admission medications   Medication Sig Start Date End Date Taking? Authorizing Provider  amphetamine-dextroamphetamine (ADDERALL XR) 30 MG 24 hr capsule Take 30 mg by  mouth daily as needed (ADHD).   Yes [provider]  Ascorbic Acid (VITAMIN C PO) Take 1 tablet by mouth daily.   Yes [provider]  BIOTIN PO Take 1 tablet by mouth daily.   Yes [provider]  clonazePAM (KLONOPIN) 1 MG tablet Take 1 mg by mouth 3 (three) times daily as needed. 01/20/21  Yes [provider]  Cyanocobalamin (VITAMIN B-12 PO) Take 1 tablet by mouth daily.   Yes [provider]  doxepin (SINEQUAN) 75 MG capsule Take 75 mg by mouth at bedtime. 12/22/20  Yes [provider]  FLUoxetine (PROZAC) 40 MG capsule Take 40 mg by mouth daily.   Yes [provider]  MAGNESIUM PO Take 1 tablet by mouth daily.   Yes [provider]  Multiple Vitamin (MULTIVITAMIN PO) Take 1 tablet by mouth daily.   Yes [provider]  Multiple Vitamins-Minerals (ZINC PO) Take 1 tablet by mouth daily.   Yes [provider]  topiramate (TOPAMAX) 50 MG tablet Take 50 mg by mouth 3 (three) times daily. 01/11/21  Yes [provider]  VITAMIN D PO Take 1 tablet by mouth daily.   Yes [provider]  ondansetron (ZOFRAN ODT) 4 MG disintegrating tablet Take 1 tablet (4 mg total) by mouth every 8 (eight) hours as needed. Patient not taking: No sig reported 02/27/19   Marcial Pacas, MD    Allergies    Amoxicillin, Bactrim [sulfamethoxazole-trimethoprim], and Zithromax [azithromycin]  Review of Systems   Review of Systems  Constitutional:  Negative for chills and fever.  HENT:  Negative for congestion and rhinorrhea.   Eyes:  Negative for redness and visual disturbance.  Respiratory:  Negative for shortness of breath and wheezing.   Cardiovascular:  Negative for chest pain and palpitations.  Gastrointestinal:  Positive for abdominal pain, nausea and vomiting.  Genitourinary:  Negative for dysuria and urgency.  Musculoskeletal:  Negative for arthralgias and myalgias.  Skin:  Negative for pallor and wound.   Neurological:  Positive for weakness. Negative for dizziness and headaches.   Physical Exam Updated Vital Signs BP 103/73 (BP Location: Right Arm)   Pulse 62   Temp 98.2 F (36.8 C) (Oral)   Resp 18   Ht 5' 4.5" (1.638 m)   Wt 54.4 kg   SpO2 100%   BMI 20.28 kg/m   Physical Exam Vitals and nursing note reviewed.  Constitutional:      General: She is not in acute distress.    Appearance: She is well-developed. She is not diaphoretic.  HENT:     Head: Normocephalic and atraumatic.  Eyes:     Pupils: Pupils are equal, round, and reactive to light.  Cardiovascular:     Rate and Rhythm: Normal rate and regular rhythm.     Heart sounds: No murmur heard.   No friction rub. No gallop.  Pulmonary:     Effort: Pulmonary effort is normal.     Breath sounds: No wheezing or rales.  Abdominal:     General: There is no distension.     Palpations: Abdomen is soft.     Tenderness: There is no abdominal tenderness.     Comments: No appreciable abdominal pain on exam  Musculoskeletal:        General: No tenderness.     Cervical back: Normal range of motion and neck supple.  Skin:    General: Skin is warm and dry.  Neurological:     Mental Status: She is alert and oriented to person, place, and time.  Psychiatric:        Behavior: Behavior normal.    ED Results / Procedures / Treatments   Labs (all labs ordered are listed, but only abnormal results are displayed) Labs Reviewed  CBC - Abnormal; Notable for the following components:      Result Value   Hemoglobin 11.0 (*)    HCT 35.7 (*)    RDW 16.2 (*)    Platelets 488 (*)    All other components within normal limits  URINALYSIS, ROUTINE W REFLEX MICROSCOPIC - Abnormal; Notable for the following components:   APPearance HAZY (*)    Bacteria, UA RARE (*)    All other components within normal limits  COMPREHENSIVE METABOLIC PANEL - Abnormal; Notable for the following components:   Potassium 2.6 (*)    Creatinine, Ser 1.02  (*)    Total Bilirubin 0.2 (*)    All other components within normal limits  LIPASE, BLOOD - Abnormal; Notable for the following components:   Lipase 125 (*)    All other components within normal limits  PREGNANCY, URINE  MAGNESIUM  CBG MONITORING, ED    EKG EKG Interpretation  Date/Time:  Tuesday February 15 2021 13:58:04 EDT Ventricular Rate:  77 PR Interval:  136 QRS Duration: 95 QT Interval:  409 QTC Calculation: 463 R Axis:   47 Text Interpretation: Sinus rhythm No significant change since last tracing Confirmed by Deno Etienne [phone]) on 02/15/2021 3:20:53 PM  Radiology No results found.  Procedures Procedures   Medications Ordered in ED Medications  potassium chloride 10 mEq in 100 mL IVPB (10 mEq Intravenous New Bag/Given 02/15/21 1543)  sodium chloride 0.9 % bolus 1,000 mL (1,000 mLs Intravenous New Bag/Given 02/15/21 1440)  metoCLOPramide (REGLAN) injection 10 mg (10 mg Intravenous Given 02/15/21 1443)  diphenhydrAMINE (BENADRYL) injection 25 mg (25 mg Intravenous Given 02/15/21 1441)  morphine 4 MG/ML injection 4 mg (4 mg Intravenous Given 02/15/21 1446)  alum & mag hydroxide-simeth (MAALOX/MYLANTA) 200-200-20 MG/5ML suspension 30 mL (30 mLs Oral Given 02/15/21 1535)  potassium chloride SA (KLOR-CON) CR tablet 40 mEq (40 mEq Oral Given 02/15/21 1538)    ED Course  I have reviewed the triage vital signs and the nursing notes.  Pertinent labs & imaging results that were available during my care of the patient were reviewed by me and considered in my medical decision  making (see chart for details).    MDM Rules/Calculators/A&P                          50 yo F with a chief complaints of nausea vomiting and diarrhea.  This is a chronic problem for her but worsening she thinks over the past 48 hours or so.  Is feeling a bit more fatigued than normal as well.  Had an episode similar that required hospitalization.  She wanted to come in to be seen prior to requiring hospital  care.  We will obtain a laboratory evaluation bolus of IV fluids pain and nausea medicine and reassess.  K 2.6, will replenish.  Give three run K, likely d/c home.   Signed out to Dr Colvin Caroli, please see her note for further details of care in the ED.   The patients results and plan were reviewed and discussed.   Any x-rays performed were independently reviewed by myself.   Differential diagnosis were considered with the presenting HPI.  Medications  potassium chloride 10 mEq in 100 mL IVPB (10 mEq Intravenous New Bag/Given 02/15/21 1543)  sodium chloride 0.9 % bolus 1,000 mL (1,000 mLs Intravenous New Bag/Given 02/15/21 1440)  metoCLOPramide (REGLAN) injection 10 mg (10 mg Intravenous Given 02/15/21 1443)  diphenhydrAMINE (BENADRYL) injection 25 mg (25 mg Intravenous Given 02/15/21 1441)  morphine 4 MG/ML injection 4 mg (4 mg Intravenous Given 02/15/21 1446)  alum & mag hydroxide-simeth (MAALOX/MYLANTA) 200-200-20 MG/5ML suspension 30 mL (30 mLs Oral Given 02/15/21 1535)  potassium chloride SA (KLOR-CON) CR tablet 40 mEq (40 mEq Oral Given 02/15/21 1538)    Vitals:   02/15/21 1352 02/15/21 1400 02/15/21 1451 02/15/21 1512  BP: 118/79 108/82  103/73  Pulse: 78 74  62  Resp: 18 20  18   Temp: 98.2 F (36.8 C)     TempSrc: Oral     SpO2: 100% 100%  100%  Weight:   54.4 kg   Height:   5' 4.5" (1.638 m)     Final diagnoses:  Nausea and vomiting in adult    Admission/ observation were discussed with the admitting physician, patient and/or family and they are comfortable with the plan.   Final Clinical Impression(s) / ED Diagnoses Final diagnoses:  Nausea and vomiting in adult    Rx / DC Orders ED Discharge Orders     None        Deno Etienne, DO 02/15/21 1543

## 2021-02-24 DIAGNOSIS — R011 Cardiac murmur, unspecified: Secondary | ICD-10-CM | POA: Insufficient documentation

## 2021-02-24 DIAGNOSIS — R87610 Atypical squamous cells of undetermined significance on cytologic smear of cervix (ASC-US): Secondary | ICD-10-CM | POA: Insufficient documentation

## 2021-02-24 DIAGNOSIS — N920 Excessive and frequent menstruation with regular cycle: Secondary | ICD-10-CM | POA: Insufficient documentation

## 2021-02-24 DIAGNOSIS — F419 Anxiety disorder, unspecified: Secondary | ICD-10-CM | POA: Insufficient documentation

## 2021-02-24 DIAGNOSIS — B977 Papillomavirus as the cause of diseases classified elsewhere: Secondary | ICD-10-CM | POA: Insufficient documentation

## 2021-03-15 ENCOUNTER — Telehealth: Payer: Self-pay | Admitting: Endocrinology

## 2021-03-15 NOTE — Telephone Encounter (Signed)
Patient requests transfer of care from Dr Loanne Drilling for second opinion. Please advise.

## 2021-04-11 ENCOUNTER — Other Ambulatory Visit: Payer: Self-pay | Admitting: Internal Medicine

## 2021-04-11 DIAGNOSIS — E042 Nontoxic multinodular goiter: Secondary | ICD-10-CM

## 2021-04-12 NOTE — ED Notes (Signed)
Per PCP, vomiting coffee ground emesis-abnormal labs, weak-sending to ED for possible bleed/admission

## 2021-04-13 ENCOUNTER — Emergency Department (HOSPITAL_COMMUNITY): Payer: Commercial Managed Care - PPO

## 2021-04-13 ENCOUNTER — Observation Stay (HOSPITAL_COMMUNITY)
Admission: EM | Admit: 2021-04-13 | Discharge: 2021-04-14 | Disposition: A | Discharge status: Home / Self Care | Payer: Commercial Managed Care - PPO | Attending: Family Medicine | Admitting: Family Medicine

## 2021-04-13 ENCOUNTER — Encounter (HOSPITAL_COMMUNITY): Payer: Self-pay

## 2021-04-13 DIAGNOSIS — R197 Diarrhea, unspecified: Secondary | ICD-10-CM | POA: Diagnosis present

## 2021-04-13 DIAGNOSIS — R112 Nausea with vomiting, unspecified: Secondary | ICD-10-CM | POA: Diagnosis not present

## 2021-04-13 DIAGNOSIS — D509 Iron deficiency anemia, unspecified: Secondary | ICD-10-CM | POA: Diagnosis not present

## 2021-04-13 DIAGNOSIS — N390 Urinary tract infection, site not specified: Secondary | ICD-10-CM | POA: Diagnosis not present

## 2021-04-13 DIAGNOSIS — Z79899 Other long term (current) drug therapy: Secondary | ICD-10-CM | POA: Diagnosis not present

## 2021-04-13 DIAGNOSIS — F909 Attention-deficit hyperactivity disorder, unspecified type: Secondary | ICD-10-CM | POA: Diagnosis present

## 2021-04-13 DIAGNOSIS — R1013 Epigastric pain: Secondary | ICD-10-CM | POA: Diagnosis not present

## 2021-04-13 DIAGNOSIS — A419 Sepsis, unspecified organism: Secondary | ICD-10-CM | POA: Diagnosis present

## 2021-04-13 DIAGNOSIS — Z20822 Contact with and (suspected) exposure to covid-19: Secondary | ICD-10-CM | POA: Insufficient documentation

## 2021-04-13 DIAGNOSIS — E041 Nontoxic single thyroid nodule: Secondary | ICD-10-CM | POA: Diagnosis present

## 2021-04-13 LAB — TYPE AND SCREEN
ABO/RH(D): O POS
Antibody Screen: NEGATIVE

## 2021-04-13 LAB — LIPASE, BLOOD: Lipase: 39 U/L (ref 11–51)

## 2021-04-13 LAB — COMPREHENSIVE METABOLIC PANEL
ALT: 17 U/L (ref 0–44)
AST: 18 U/L (ref 15–41)
Albumin: 4 g/dL (ref 3.5–5.0)
Alkaline Phosphatase: 60 U/L (ref 38–126)
Anion gap: 7 (ref 5–15)
BUN: 10 mg/dL (ref 6–20)
CO2: 23 mmol/L (ref 22–32)
Calcium: 8.7 mg/dL — ABNORMAL LOW (ref 8.9–10.3)
Chloride: 106 mmol/L (ref 98–111)
Creatinine, Ser: 0.97 mg/dL (ref 0.44–1.00)
GFR, Estimated: >60 mL/min (ref >60)
Glucose, Bld: 97 mg/dL (ref 70–99)
Potassium: 3.8 mmol/L (ref 3.5–5.1)
Sodium: 136 mmol/L (ref 135–145)
Total Bilirubin: 0.3 mg/dL (ref 0.3–1.2)
Total Protein: 7.8 g/dL (ref 6.5–8.1)

## 2021-04-13 LAB — CBC
HCT: 31.3 % — ABNORMAL LOW (ref 36.0–46.0)
Hemoglobin: 9.3 g/dL — ABNORMAL LOW (ref 12.0–15.0)
MCH: 24.9 pg — ABNORMAL LOW (ref 26.0–34.0)
MCHC: 29.7 g/dL — ABNORMAL LOW (ref 30.0–36.0)
MCV: 83.9 fL (ref 80.0–100.0)
Platelets: 496 10*3/uL — ABNORMAL HIGH (ref 150–400)
RBC: 3.73 MIL/uL — ABNORMAL LOW (ref 3.87–5.11)
RDW: 16.1 % — ABNORMAL HIGH (ref 11.5–15.5)
WBC: 10 10*3/uL (ref 4.0–10.5)
nRBC: 0 % (ref 0.0–0.2)

## 2021-04-13 LAB — RESP PANEL BY RT-PCR (FLU A&B, COVID) ARPGX2
Influenza A by PCR: NEGATIVE
Influenza B by PCR: NEGATIVE
SARS Coronavirus 2 by RT PCR: NEGATIVE

## 2021-04-13 LAB — URINALYSIS, ROUTINE W REFLEX MICROSCOPIC
Bilirubin Urine: NEGATIVE
Glucose, UA: NEGATIVE mg/dL
Ketones, ur: NEGATIVE mg/dL
Nitrite: POSITIVE — AB
Protein, ur: NEGATIVE mg/dL
Specific Gravity, Urine: 1.003 — ABNORMAL LOW (ref 1.005–1.030)
WBC, UA: >50 WBC/hpf — ABNORMAL HIGH (ref 0–5)
pH: 6 (ref 5.0–8.0)

## 2021-04-13 LAB — POC OCCULT BLOOD, ED
Fecal Occult Bld: NEGATIVE
Fecal Occult Bld: NEGATIVE

## 2021-04-13 MED ORDER — SODIUM CHLORIDE 0.9 % IV SOLN
2.0000 g | INTRAVENOUS | Status: DC
  Administered 2021-04-14: 2 g via INTRAVENOUS
  Filled 2021-04-13: qty 20

## 2021-04-13 MED ORDER — MORPHINE SULFATE (PF) 2 MG/ML IV SOLN
2.0000 mg | Freq: Once | INTRAVENOUS | Status: AC
  Administered 2021-04-13: 2 mg via INTRAVENOUS
  Filled 2021-04-13: qty 1

## 2021-04-13 MED ORDER — SODIUM CHLORIDE 0.9 % IV SOLN
12.5000 mg | Freq: Four times a day (QID) | INTRAVENOUS | Status: DC | PRN
  Filled 2021-04-13: qty 0.5

## 2021-04-13 MED ORDER — FENTANYL CITRATE (PF) 100 MCG/2ML IJ SOLN
50.0000 ug | Freq: Once | INTRAMUSCULAR | Status: AC
  Administered 2021-04-13: 50 ug via INTRAVENOUS
  Filled 2021-04-13: qty 2

## 2021-04-13 MED ORDER — ONDANSETRON HCL 4 MG/2ML IJ SOLN
4.0000 mg | Freq: Once | INTRAMUSCULAR | Status: AC
  Administered 2021-04-13: 4 mg via INTRAVENOUS
  Filled 2021-04-13: qty 2

## 2021-04-13 MED ORDER — SODIUM CHLORIDE 0.9 % IV BOLUS
1000.0000 mL | Freq: Once | INTRAVENOUS | Status: AC
  Administered 2021-04-13: 1000 mL via INTRAVENOUS

## 2021-04-13 MED ORDER — IOHEXOL 350 MG/ML SOLN
100.0000 mL | Freq: Once | INTRAVENOUS | Status: AC | PRN
  Administered 2021-04-13: 80 mL via INTRAVENOUS

## 2021-04-13 MED ORDER — LACTATED RINGERS IV SOLN: INTRAVENOUS | Status: DC

## 2021-04-13 MED ORDER — METOCLOPRAMIDE HCL 5 MG/ML IJ SOLN
5.0000 mg | Freq: Four times a day (QID) | INTRAMUSCULAR | Status: DC | PRN
  Administered 2021-04-14 (×2): 5 mg via INTRAVENOUS
  Filled 2021-04-13 (×2): qty 2

## 2021-04-13 MED ORDER — SODIUM CHLORIDE 0.9 % IV BOLUS
500.0000 mL | Freq: Once | INTRAVENOUS | Status: AC
  Administered 2021-04-13: 500 mL via INTRAVENOUS

## 2021-04-13 MED ORDER — SODIUM CHLORIDE 0.9 % IV SOLN: 1.0000 g | Freq: Once | INTRAVENOUS | Status: DC

## 2021-04-13 NOTE — ED Notes (Signed)
Pt aware stool sample is needed.

## 2021-04-13 NOTE — ED Notes (Signed)
ED TO INPATIENT HANDOFF REPORT  Name/Age/Gender Emily Park 49 y.o. female  Code Status Code Status History    Date Active Date Inactive Code Status Order ID Comments User Context   08/29/2020 1720 09/02/2020 1700 Full Code [id number]  Laurin Coder, MD ED   11/13/2015 0358 11/14/2015 2047 Full Code [id number]  Danford, Suann Larry, MD Inpatient    Questions for Most Recent Historical Code Status (Order [id number])       Home/SNF/Other Home  Chief Complaint Sepsis ([place]) [A41.9]  Level of Care/Admitting Diagnosis ED Disposition    ED Disposition  Admit   Condition  --   Comment  Hospital Area: [facility] [[id number]]  Level of Care: Progressive [102]  Admit to Progressive based on following criteria: GI, ENDOCRINE disease patients with GI bleeding, acute liver failure or pancreatitis, stable with diabetic ketoacidosis or thyrotoxicosis (hypothyroid) state.  May place patient in observation at [facility] or [facility] if equivalent level of care is available:: No  Covid Evaluation: Confirmed COVID Negative  [facility]: [id number]  Admitting Physician: Toy Baker [[id number]]  Attending Physician: Toy Baker [[id number]]         Medical History Past Medical History:  Diagnosis Date  . Abnormal TSH   . ADD (attention deficit disorder)   . Anemia   . Anxiety   . Arrhythmia   . Decreased libido   . Depression   . Heart murmur   . Hyperlipidemia   . Hypokalemia   . Migraine   . Pap smear abnormality of cervix/human papillomavirus (HPV) positive   . Sleep difficulties   . SVT (supraventricular tachycardia) (HCC)     Allergies Allergies  Allergen Reactions  . Amoxicillin Rash  . Bactrim [Sulfamethoxazole-Trimethoprim] Rash    Rash noted to chest and back  . Zithromax [Azithromycin] Rash    IV Location/Drains/Wounds Patient Lines/Drains/Airways Status    Active Line/Drains/Airways    Name Placement date Placement time  Site Days   Peripheral IV 04/13/21 20 G Left Antecubital 04/13/21  1155  Antecubital  less than 1          Labs/Imaging Results for orders placed or performed during the hospital encounter of 04/13/21 (from the past 48 hour(s))  Comprehensive metabolic panel     Status: Abnormal   Collection Time: 04/13/21 11:55 AM  Result Value Ref Range   Sodium 136 135 - 145 mmol/L   Potassium 3.8 3.5 - 5.1 mmol/L   Chloride 106 98 - 111 mmol/L   CO2 23 22 - 32 mmol/L   Glucose, Bld 97 70 - 99 mg/dL    Comment: Glucose reference range applies only to samples taken after fasting for at least 8 hours.   BUN 10 6 - 20 mg/dL   Creatinine, Ser 0.97 0.44 - 1.00 mg/dL   Calcium 8.7 (L) 8.9 - 10.3 mg/dL   Total Protein 7.8 6.5 - 8.1 g/dL   Albumin 4.0 3.5 - 5.0 g/dL   AST 18 15 - 41 U/L   ALT 17 0 - 44 U/L   Alkaline Phosphatase 60 38 - 126 U/L   Total Bilirubin 0.3 0.3 - 1.2 mg/dL   GFR, Estimated >60 >60 mL/min    Comment: (NOTE) Calculated using the CKD-EPI Creatinine Equation (2021)    Anion gap 7 5 - 15    Comment: Performed at [facility], [place] [address], [place], [place] [address]  CBC     Status: Abnormal   Collection  Time: 04/13/21 11:55 AM  Result Value Ref Range   WBC 10.0 4.0 - 10.5 K/uL   RBC 3.73 (L) 3.87 - 5.11 MIL/uL   Hemoglobin 9.3 (L) 12.0 - 15.0 g/dL   HCT 31.3 (L) 36.0 - 46.0 %   MCV 83.9 80.0 - 100.0 fL   MCH 24.9 (L) 26.0 - 34.0 pg   MCHC 29.7 (L) 30.0 - 36.0 g/dL   RDW 16.1 (H) 11.5 - 15.5 %   Platelets 496 (H) 150 - 400 K/uL   nRBC 0.0 0.0 - 0.2 %    Comment: Performed at [facility], [place] [address], [place], [place] [address]  Type and screen [place]     Status: None   Collection Time: 04/13/21 11:55 AM  Result Value Ref Range   ABO/RH(D) O POS    Antibody Screen NEG    Sample Expiration      04/16/2021,2359 Performed at [facility] [address], [place], [place] [address]    Lipase, blood     Status: None   Collection Time: 04/13/21 11:55 AM  Result Value Ref Range   Lipase 39 11 - 51 U/L    Comment: Performed at [facility], [place] [address], [place], [place] [address]  POC occult blood, ED     Status: None   Collection Time: 04/13/21 12:21 PM  Result Value Ref Range   Fecal Occult Bld NEGATIVE NEGATIVE  Urinalysis, Routine w reflex microscopic Nasopharyngeal Swab     Status: Abnormal   Collection Time: 04/13/21  2:13 PM  Result Value Ref Range   Color, Urine STRAW (A) YELLOW   APPearance CLOUDY (A) CLEAR   Specific Gravity, Urine 1.003 (L) 1.005 - 1.030   pH 6.0 5.0 - 8.0   Glucose, UA NEGATIVE NEGATIVE mg/dL   Hgb urine dipstick SMALL (A) NEGATIVE   Bilirubin Urine NEGATIVE NEGATIVE   Ketones, ur NEGATIVE NEGATIVE mg/dL   Protein, ur NEGATIVE NEGATIVE mg/dL   Nitrite POSITIVE (A) NEGATIVE   Leukocytes,Ua LARGE (A) NEGATIVE   RBC / HPF 11-20 0 - 5 RBC/hpf   WBC, UA >50 (H) 0 - 5 WBC/hpf   Bacteria, UA MANY (A) NONE SEEN   Squamous Epithelial / LPF 0-5 0 - 5   WBC Clumps PRESENT    Mucus PRESENT     Comment: Performed at [facility], [place] [address] [place], [place] [address]  Resp Panel by RT-PCR (Flu A&B, Covid) Nasopharyngeal Swab     Status: None   Collection Time: 04/13/21  2:13 PM   Specimen: Nasopharyngeal Swab; Nasopharyngeal(NP) swabs in vial transport medium  Result Value Ref Range   SARS Coronavirus 2 by RT PCR NEGATIVE NEGATIVE    Comment: (NOTE) SARS-CoV-2 target nucleic acids are NOT DETECTED.  The SARS-CoV-2 RNA is generally detectable in upper respiratory specimens during the acute phase of infection. The lowest concentration of SARS-CoV-2 viral copies this assay can detect is 138 copies/mL. A negative result does not preclude SARS-Cov-2 infection and should not be used as the sole basis for treatment or other patient management decisions. A negative result may occur with  improper  specimen collection/handling, submission of specimen other than nasopharyngeal swab, presence of viral mutation(s) within the areas targeted by this assay, and inadequate number of viral copies(<138 copies/mL). A negative result must be combined with clinical observations, patient history, and epidemiological information. The expected result is Negative.  Fact Sheet for Patients:  EntrepreneurPulse.com.au  Fact Sheet for Healthcare Providers:  IncredibleEmployment.be  This test is no t yet approved or cleared by the [place] FDA and  has been authorized for detection and/or diagnosis of SARS-CoV-2 by FDA under an Emergency Use Authorization (EUA). This EUA will remain  in effect (meaning this test can be used) for the duration of the COVID-19 declaration under Section 564(b)(1) of the Act, 21 U.S.C.section 360bbb-3(b)(1), unless the authorization is terminated  or revoked sooner.       Influenza A by PCR NEGATIVE NEGATIVE   Influenza B by PCR NEGATIVE NEGATIVE    Comment: (NOTE) The Xpert Xpress SARS-CoV-2/FLU/RSV plus assay is intended as an aid in the diagnosis of influenza from Nasopharyngeal swab specimens and should not be used as a sole basis for treatment. Nasal washings and aspirates are unacceptable for Xpert Xpress SARS-CoV-2/FLU/RSV testing.  Fact Sheet for Patients: EntrepreneurPulse.com.au  Fact Sheet for Healthcare Providers: IncredibleEmployment.be  This test is not yet approved or cleared by the [place] FDA and has been authorized for detection and/or diagnosis of SARS-CoV-2 by FDA under an Emergency Use Authorization (EUA). This EUA will remain in effect (meaning this test can be used) for the duration of the COVID-19 declaration under Section 564(b)(1) of the Act, 21 U.S.C. section 360bbb-3(b)(1), unless the authorization is terminated or revoked.  Performed at [facility], [place] [address], [place], [place] [address]   POC occult blood, ED RN will collect     Status: None   Collection Time: 04/13/21  6:05 PM  Result Value Ref Range   Fecal Occult Bld NEGATIVE NEGATIVE   CT ABDOMEN PELVIS W CONTRAST  Result Date: 04/13/2021 CLINICAL DATA:  Epigastric pain with nausea, vomiting and diarrhea for 1 week. EXAM: CT ABDOMEN AND PELVIS WITH CONTRAST TECHNIQUE: Multidetector CT imaging of the abdomen and pelvis was performed using the standard protocol following bolus administration of intravenous contrast. CONTRAST:  25m OMNIPAQUE IOHEXOL 350 MG/ML SOLN COMPARISON:  None. FINDINGS: Lower chest: The lung bases are grossly clear. No pulmonary lesions or pleural effusions. The heart is within normal limits in size. No pericardial effusion. There is a small to moderate-sized hiatal hernia noted. Hepatobiliary: No hepatic lesions or intrahepatic biliary dilatation. Gallbladder is unremarkable. No common bile duct dilatation. Pancreas: No mass, inflammation or ductal dilatation. Spleen: Normal size.  No focal lesions. Adrenals/Urinary Tract: The adrenal glands are normal. Small bilateral renal calculi are noted but no obstructing ureteral calculi or bladder calculi. No worrisome renal or bladder lesions. Stomach/Bowel: The stomach, duodenum and small bowel are unremarkable. No acute inflammatory process, mass lesions or obstructive findings. The colon is distended and fluid-filled with air-fluid levels. These findings are typically seen with diarrhea. I do not see any wall thickening or pericolonic inflammatory changes. The rectum and sigmoid colon are relatively decompressed. Vascular/Lymphatic: The aorta is normal in caliber. No dissection. The branch vessels are patent. The major venous structures are patent. No mesenteric or retroperitoneal mass or adenopathy. Small scattered lymph nodes are noted. Home Reproductive: The uterus and ovaries are unremarkable. Other:  No pelvic mass or adenopathy. No free pelvic fluid collections. No inguinal mass or adenopathy. No abdominal wall hernia or subcutaneous lesions. Musculoskeletal: No significant bony findings. IMPRESSION: 1. Distended and fluid-filled colon with air-fluid levels typically seen with diarrhea. No colonic wall thickening or pericolonic inflammatory changes. The stomach and small bowel unremarkable. 2. Small bilateral renal calculi but no obstructing ureteral calculi or bladder calculi. 3. Small to  moderate-sized hiatal hernia. Electronically Signed   By: Marijo Sanes M.D.   On: 04/13/2021 16:35    Pending Labs Unresulted Labs (From admission, onward)    Start     Ordered   04/14/21 0500  Vitamin B12  (Anemia Panel (PNL))  Tomorrow morning,   R        04/13/21 2222   04/14/21 0500  Folate  (Anemia Panel (PNL))  Tomorrow morning,   R        04/13/21 2222   04/14/21 0500  Iron and TIBC  (Anemia Panel (PNL))  Tomorrow morning,   R        04/13/21 2222   04/14/21 0500  Ferritin  (Anemia Panel (PNL))  Tomorrow morning,   R        04/13/21 2222   04/14/21 0500  Reticulocytes  (Anemia Panel (PNL))  Tomorrow morning,   R        04/13/21 2222   04/13/21 2122  Urine rapid drug screen (hosp performed)  Add-on,   AD        04/13/21 2121   04/13/21 2114  CBC with Differential/Platelet  Add-on,   AD        04/13/21 2113   04/13/21 2013  Magnesium  Add-on,   AD        04/13/21 2012   04/13/21 2013  CK  Add-on,   AD        04/13/21 2012   04/13/21 2013  Procalcitonin  Add-on,   AD        04/13/21 2013   04/13/21 1754  Gastrointestinal Panel by PCR , Stool  (Gastrointestinal Panel by PCR, Stool                                                                                                                                                     **Does Not include CLOSTRIDIUM DIFFICILE testing. **If CDIFF testing is needed, place order from the "C Difficile Testing" order set.**)  Once,   STAT        04/13/21 1753    04/13/21 1754  C Difficile Quick Screen w PCR reflex  (C Difficile quick screen w PCR reflex panel)  Once, for 24 hours,   STAT       References:    CDiff Information Tool   04/13/21 1753   04/13/21 1729  Culture, blood (routine x 2)  BLOOD CULTURE X 2,   STAT      04/13/21 1728   04/13/21 1517  Urine Culture  Once,   STAT       Comments: UA concerning for infection, nitrite positive, abdominal pain, leukocytosis, borderline hypotension.   Question:  Indication  Answer:  Urgency/frequency   04/13/21 1517   Signed and Held  Magnesium  Tomorrow morning,   R  Signed and Held   Signed and Held  Phosphorus  Tomorrow morning,   R        Signed and Held   Signed and Held  CBC WITH DIFFERENTIAL  Tomorrow morning,   R        Signed and Held   Signed and Held  Comprehensive metabolic panel  Tomorrow morning,   R        Signed and Held          Vitals/Pain Today's Vitals   04/13/21 1845 04/13/21 1902 04/13/21 1945 04/13/21 2324  BP: 110/69  102/69 (!) 96/59  Pulse: 85  75 90  Resp: (!) 22  (!) 23 14  Temp:      TempSrc:      SpO2: 97%  98% 96%  PainSc:  6       Isolation Precautions Enteric precautions (UV disinfection)  Medications Medications  lactated ringers infusion (has no administration in time range)  promethazine (PHENERGAN) 12.5 mg in sodium chloride 0.9 % 50 mL IVPB (has no administration in time range)  cefTRIAXone (ROCEPHIN) 2 g in sodium chloride 0.9 % 100 mL IVPB (has no administration in time range)  metoCLOPramide (REGLAN) injection 5 mg (has no administration in time range)  sodium chloride 0.9 % bolus 1,000 mL (1,000 mLs Intravenous Bolus 04/13/21 1233)  ondansetron (ZOFRAN) injection 4 mg (4 mg Intravenous Given 04/13/21 1233)  morphine 2 MG/ML injection 2 mg (2 mg Intravenous Given 04/13/21 1352)  sodium chloride 0.9 % bolus 500 mL (500 mLs Intravenous Bolus 04/13/21 1626)  iohexol (OMNIPAQUE) 350 MG/ML injection 100 mL (80 mLs Intravenous Contrast  Given 04/13/21 1558)  fentaNYL (SUBLIMAZE) injection 50 mcg (50 mcg Intravenous Given 04/13/21 1822)    Mobility walks

## 2021-04-13 NOTE — ED Notes (Signed)
Pt given water and saltine crackers to see if she's able to tolerate it.

## 2021-04-13 NOTE — H&P (Signed)
Emily Park Emily Park:[id number] DOB: 01-22-1971 DOA: 04/13/2021     PCP: Melida Quitter, MD   Outpatient Specialists:    Endocrinology [name] Patient arrived to ER on 04/13/21 at 1109 Referred by Attending Alvira Monday, MD   Patient coming from: home Lives  With family    Chief Complaint:   Chief Complaint  Patient presents with   Abdominal Pain   Abnormal Lab    HPI: Emily Park is a 50 y.o. female with medical history significant of  blood transfusions, SVT, HLD    Presented with  1 wk of V/N/D Unable to tolerte PO and has severe diarrhea Cannot tolerate any PO Was seen by GI who wanted to have scope and were worried bc her HG has drifted a bit down Last similar admit was in January was diagnosed with hypovolemic shock improved with IV fluids initially thought to have Addison's disease but later on followed with endocrinology who followed symptoms not consistent with that. No history of GI bleeding.  Significant GERD Plan for her to have endoscopy and colonoscopy done in next few days by reports that GI told her that her hemoglobin had dropped a bit and so she needed to go to emergency department. No urinary complaints Reports history of thyroid disease she was told that she has a mass on her thyroid that needs to have further evaluation Noted per review of records in March she had ultrasound of the thyroid that showed 1.3 cm right thyroid nodule  Initial COVID TEST  NEGATIVE   Lab Results  Component Value Date   SARSCOV2NAA NEGATIVE 04/13/2021   SARSCOV2NAA NEGATIVE 08/29/2020     Regarding pertinent Chronic problems:    Hyperlipidemia - not on statins Lipid Panel     Component Value Date/Time   CHOL 208 (H) 02/24/2014 1439   TRIG 81.0 02/24/2014 1439   HDL 54.90 02/24/2014 1439   CHOLHDL 4 02/24/2014 1439   VLDL 16.2 02/24/2014 1439   LDLCALC 137 (H) 02/24/2014 1439      Chronic anemia - baseline hg Hemoglobin & Hematocrit  Recent Labs     09/01/20 0912 02/15/21 1428 04/13/21 1155  HGB 9.7* 11.0* 9.3*     While in ER: CT abd evidence of Diarrhea No pyelo Initially tachy and soft BP 90/53 HG 9.3   ED Triage Vitals  Enc Vitals Group     BP 04/13/21 1116 112/87     Pulse Rate 04/13/21 1116 (!) 108     Resp 04/13/21 1116 18     Temp 04/13/21 1116 98.1 F (36.7 C)     Temp Source 04/13/21 1116 Oral     SpO2 04/13/21 1116 95 %     Weight --      Height --      Head Circumference --      Peak Flow --      Pain Score 04/13/21 1118 5     Pain Loc --      Pain Edu? --      Excl. in GC? --   TMAX(24)@     _________________________________________ Significant initial  Findings: Abnormal Labs Reviewed  COMPREHENSIVE METABOLIC PANEL - Abnormal; Notable for the following components:      Result Value   Calcium 8.7 (*)    All other components within normal limits  CBC - Abnormal; Notable for the following components:   RBC 3.73 (*)    Hemoglobin 9.3 (*)    HCT 31.3 (*)  MCH 24.9 (*)    MCHC 29.7 (*)    RDW 16.1 (*)    Platelets 496 (*)    All other components within normal limits  URINALYSIS, ROUTINE W REFLEX MICROSCOPIC - Abnormal; Notable for the following components:   Color, Urine STRAW (*)    APPearance CLOUDY (*)    Specific Gravity, Urine 1.003 (*)    Hgb urine dipstick SMALL (*)    Nitrite POSITIVE (*)    Leukocytes,Ua LARGE (*)    WBC, UA >50 (*)    Bacteria, UA MANY (*)    All other components within normal limits   ____________________________________________ Ordered    CTabd/pelvis -fluid-filled colon seen in diarrhea no evidence of inflammatory infectious etiology.   ECG: Ordered Personally reviewed by me showing: HR : 77 Rhythm:  NSR,  Paced  no evidence of ischemic changes QTC 463 _________   The recent clinical data is shown below. Vitals:   04/13/21 1815 04/13/21 1830 04/13/21 1845 04/13/21 1945  BP: 101/64 103/72 110/69 102/69  Pulse: 71 78 85 75  Resp: 15 13 (!) 22  (!) 23  Temp:      TempSrc:      SpO2: 98% 96% 97% 98%    WBC     Component Value Date/Time   WBC 10.0 04/13/2021 1155   LYMPHSABS 2.2 08/30/2020 0732   MONOABS 1.2 (H) 08/30/2020 0732   EOSABS 0.1 08/30/2020 0732   BASOSABS 0.1 08/30/2020 0732     Lactic Acid, Venous    Component Value Date/Time   LATICACIDVEN 1.9 08/30/2020 0732       UA  evidence of UTI     Urine analysis:    Component Value Date/Time   COLORURINE STRAW (A) 04/13/2021 1413   APPEARANCEUR CLOUDY (A) 04/13/2021 1413   LABSPEC 1.003 (L) 04/13/2021 1413   PHURINE 6.0 04/13/2021 1413   GLUCOSEU NEGATIVE 04/13/2021 1413   HGBUR SMALL (A) 04/13/2021 1413   BILIRUBINUR NEGATIVE 04/13/2021 1413   BILIRUBINUR Neg 02/25/2014 0858   KETONESUR NEGATIVE 04/13/2021 1413   PROTEINUR NEGATIVE 04/13/2021 1413   UROBILINOGEN 0.2 02/25/2014 0858   UROBILINOGEN 0.2 05/20/2009 0310   NITRITE POSITIVE (A) 04/13/2021 1413   LEUKOCYTESUR LARGE (A) 04/13/2021 1413    Results for orders placed or performed during the hospital encounter of 04/13/21  Resp Panel by RT-PCR (Flu A&B, Covid) Nasopharyngeal Swab     Status: None   Collection Time: 04/13/21  2:13 PM   Specimen: Nasopharyngeal Swab; Nasopharyngeal(NP) swabs in vial transport medium  Result Value Ref Range Status   SARS Coronavirus 2 by RT PCR NEGATIVE NEGATIVE Final         Influenza A by PCR NEGATIVE NEGATIVE Final   Influenza B by PCR NEGATIVE NEGATIVE Final           __________________________________________ ER Provider Called: [facility]   Dr. Ewing Schlein They Recommend admit to medicine   Will see in AM    _______________________________________________ Hospitalist was called for admission for diarrhea dehydration  The following Work up has been ordered so far:  Orders Placed This Encounter  Procedures   Resp Panel by RT-PCR (Flu A&B, Covid) Nasopharyngeal Swab   Urine Culture   Culture, blood (routine x 2)   Gastrointestinal Panel by PCR , Stool    C Difficile Quick Screen w PCR reflex   CT ABDOMEN PELVIS W CONTRAST   Comprehensive metabolic panel   CBC   Lipase, blood   Urinalysis, Routine w reflex  microscopic   Diet NPO time specified   Orthostatic Vital signs   Place X2 Large Bore IV's   Initiate Carrier Fluid Protocol   Bladder scan   Orthostatic vital signs   Consult to hospitalist   Enteric precautions (UV disinfection) C difficile, Norovirus   POC occult blood, ED   POC occult blood, ED RN will collect   Type and screen [facility]      Following Medications were ordered in ER: Medications  lactated ringers infusion (has no administration in time range)  promethazine (PHENERGAN) 12.5 mg in sodium chloride 0.9 % 50 mL IVPB (has no administration in time range)  cefTRIAXone (ROCEPHIN) 1 g in sodium chloride 0.9 % 100 mL IVPB (has no administration in time range)  sodium chloride 0.9 % bolus 1,000 mL (1,000 mLs Intravenous Bolus 04/13/21 1233)  ondansetron (ZOFRAN) injection 4 mg (4 mg Intravenous Given 04/13/21 1233)  morphine 2 MG/ML injection 2 mg (2 mg Intravenous Given 04/13/21 1352)  sodium chloride 0.9 % bolus 500 mL (500 mLs Intravenous Bolus 04/13/21 1626)  iohexol (OMNIPAQUE) 350 MG/ML injection 100 mL (80 mLs Intravenous Contrast Given 04/13/21 1558)  fentaNYL (SUBLIMAZE) injection 50 mcg (50 mcg Intravenous Given 04/13/21 1822)        Consult Orders  (From admission, onward)           Start     Ordered   04/13/21 1938  Consult to hospitalist  Once       Provider:  (Not yet assigned)  Question Answer Comment  Place call to: [facility]   Reason for Consult Admit      04/13/21 1937              OTHER Significant initial  Findings:  labs showing:    Recent Labs  Lab 04/13/21 1155  NA 136  K 3.8  CO2 23  GLUCOSE 97  BUN 10  CREATININE 0.97  CALCIUM 8.7*    Cr   stable,    Lab Results  Component Value Date   CREATININE 0.97 04/13/2021   CREATININE 1.02  (H) 02/15/2021   CREATININE 1.02 12/30/2020    Recent Labs  Lab 04/13/21 1155  AST 18  ALT 17  ALKPHOS 60  BILITOT 0.3  PROT 7.8  ALBUMIN 4.0   Lab Results  Component Value Date   CALCIUM 8.7 (L) 04/13/2021   PHOS 6.8 (H) 08/29/2020        Plt: Lab Results  Component Value Date   PLT 496 (H) 04/13/2021        Recent Labs  Lab 04/13/21 1155  WBC 10.0  HGB 9.3*  HCT 31.3*  MCV 83.9  PLT 496*    HG/HCT * stable,  Down *Up from baseline see below    Component Value Date/Time   HGB 9.3 (L) 04/13/2021 1155   HCT 31.3 (L) 04/13/2021 1155   MCV 83.9 04/13/2021 1155     Recent Labs  Lab 04/13/21 1155  LIPASE 39   No results for input(s): AMMONIA in the last 168 hours.   Cardiac Panel (last 3 results) Recent Labs    04/14/21 0123  CKTOTAL 48     BNP (last 3 results) No results for input(s): BNP in the last 8760 hours.    DM  labs:  HbA1C: Recent Labs    08/31/20 0250  HGBA1C 5.5       CBG (last 3)  No results for input(s): GLUCAP in the last 72  hours.        Cultures:    Component Value Date/Time   SDES  08/29/2020 1821    IN/OUT CATH URINE Performed at [facility], [address], [place], [place] [address]    SPECREQUEST  08/29/2020 1821    NONE Performed at [facility], [address], [place], [place] [address]    CULT  08/29/2020 1821    NO GROWTH Performed at [facility], [address] [place], [place] [address]    REPTSTATUS 08/31/2020 FINAL 08/29/2020 1821     Radiological Exams on Admission: CT ABDOMEN PELVIS W CONTRAST  Result Date: 04/13/2021 CLINICAL DATA:  Epigastric pain with nausea, vomiting and diarrhea for 1 week. EXAM: CT ABDOMEN AND PELVIS WITH CONTRAST TECHNIQUE: Multidetector CT imaging of the abdomen and pelvis was performed using the standard protocol following bolus administration of intravenous contrast. CONTRAST:  80mL OMNIPAQUE IOHEXOL 350 MG/ML SOLN  COMPARISON:  None. FINDINGS: Lower chest: The lung bases are grossly clear. No pulmonary lesions or pleural effusions. The heart is within normal limits in size. No pericardial effusion. There is a small to moderate-sized hiatal hernia noted. Hepatobiliary: No hepatic lesions or intrahepatic biliary dilatation. Gallbladder is unremarkable. No common bile duct dilatation. Pancreas: No mass, inflammation or ductal dilatation. Spleen: Normal size.  No focal lesions. Adrenals/Urinary Tract: The adrenal glands are normal. Small bilateral renal calculi are noted but no obstructing ureteral calculi or bladder calculi. No worrisome renal or bladder lesions. Stomach/Bowel: The stomach, duodenum and small bowel are unremarkable. No acute inflammatory process, mass lesions or obstructive findings. The colon is distended and fluid-filled with air-fluid levels. These findings are typically seen with diarrhea. I do not see any wall thickening or pericolonic inflammatory changes. The rectum and sigmoid colon are relatively decompressed. Vascular/Lymphatic: The aorta is normal in caliber. No dissection. The branch vessels are patent. The major venous structures are patent. No mesenteric or retroperitoneal mass or adenopathy. Small scattered lymph nodes are noted. Home Reproductive: The uterus and ovaries are unremarkable. Other: No pelvic mass or adenopathy. No free pelvic fluid collections. No inguinal mass or adenopathy. No abdominal wall hernia or subcutaneous lesions. Musculoskeletal: No significant bony findings. IMPRESSION: 1. Distended and fluid-filled colon with air-fluid levels typically seen with diarrhea. No colonic wall thickening or pericolonic inflammatory changes. The stomach and small bowel unremarkable. 2. Small bilateral renal calculi but no obstructing ureteral calculi or bladder calculi. 3. Small to moderate-sized hiatal hernia. Electronically Signed   By: Rudie Meyer M.D.   On: 04/13/2021 16:35    _______________________________________________________________________________________________________ Latest  Blood pressure 102/69, pulse 75, temperature 98.1 F (36.7 C), temperature source Oral, resp. rate (!) 23, SpO2 98 %.   Review of Systems:    Pertinent positives include:  fatigue ,abdominal pain, nausea, vomiting, diarrhea, change in bowel habits,  Constitutional:  No weight loss, night sweats, Fevers, chills,  weight loss  HEENT:  No headaches, Difficulty swallowing,Tooth/dental problems,Sore throat,  No sneezing, itching, ear ache, nasal congestion, post nasal drip,  Cardio-vascular:  No chest pain, Orthopnea, PND, anasarca, dizziness, palpitations.no Bilateral lower extremity swelling  GI:  No heartburn, indigestion,  loss of appetite, melena, blood in stool, hematemesis Resp:  no shortness of breath at rest. No dyspnea on exertion, No excess mucus, no productive cough, No non-productive cough, No coughing up of blood.No change in color of mucus.No wheezing. Skin:  no rash or lesions. No jaundice GU:  no dysuria, change in color of  urine, no urgency or frequency. No straining to urinate.  No flank pain.  Musculoskeletal:  No joint pain or no joint swelling. No decreased range of motion. No back pain.  Psych:  No change in mood or affect. No depression or anxiety. No memory loss.  Neuro: no localizing neurological complaints, no tingling, no weakness, no double vision, no gait abnormality, no slurred speech, no confusion  All systems reviewed and apart from HOPI all are negative _______________________________________________________________________________________________ Past Medical History:   Past Medical History:  Diagnosis Date   Abnormal TSH    ADD (attention deficit disorder)    Anemia    Anxiety    Arrhythmia    Decreased libido    Depression    Heart murmur    Hyperlipidemia    Hypokalemia    Migraine    Pap smear abnormality of cervix/human  papillomavirus (HPV) positive    Sleep difficulties    SVT (supraventricular tachycardia) (HCC)       Past Surgical History:  Procedure Laterality Date   ABLATION     Cardiac ablation, endometrial ablation   BREAST LUMPECTOMY     right breast   BREAST LUMPECTOMY Right    CARDIOVERSION     COLPOSCOPY     w/ cervical biopsy   DILITATION & CURRETTAGE/HYSTROSCOPY WITH NOVASURE ABLATION N/A 09/11/2014   Procedure: DILATATION & CURETTAGE/HYSTEROSCOPY WITH NOVASURE ABLATION;  Surgeon: Lenoard Aden, MD;  Location: [facility];  Service: Gynecology;  Laterality: N/A;    Social History:  Ambulatory  independently      reports that she has never smoked. She has been exposed to tobacco smoke. She has never used smokeless tobacco. She reports current alcohol use of about 2.0 standard drinks per week. She reports that she does not currently use drugs.     Family History:   Family History  Problem Relation Age of Onset   Hypothyroidism Mother    Depression Mother    Cancer Mother        thyroid   Thyroid disease Mother    Hypertension Father    Heart attack Father        Age 72   AAA (abdominal aortic aneurysm) Father    Hypothyroidism Sister    Hyperthyroidism Sister    Diabetes Maternal Grandfather    Breast cancer Paternal Grandmother    ______________________________________________________________________________________________ Allergies: Allergies  Allergen Reactions   Amoxicillin Rash   Bactrim [Sulfamethoxazole-Trimethoprim] Rash    Rash noted to chest and back   Zithromax [Azithromycin] Rash     Prior to Admission medications   Medication Sig Start Date End Date Taking? Authorizing Provider  amphetamine-dextroamphetamine (ADDERALL XR) 30 MG 24 hr capsule Take 30 mg by mouth daily as needed (ADHD).   Yes [provider]  Ascorbic Acid (VITAMIN C PO) Take 1 tablet by mouth daily.   Yes [provider]  BIOTIN PO Take 1 tablet by mouth daily.    Yes [provider]  clonazePAM (KLONOPIN) 1 MG tablet Take 1 mg by mouth at bedtime. 01/20/21  Yes [provider]  doxepin (SINEQUAN) 75 MG capsule Take 75 mg by mouth at bedtime. 12/22/20  Yes [provider]  FLUoxetine (PROZAC) 20 MG capsule Take 60 mg by mouth every morning. 04/01/21  Yes [provider]  MAGNESIUM PO Take 1 tablet by mouth daily.   Yes [provider]  Multiple Vitamin (MULTIVITAMIN PO) Take 1 tablet by mouth daily.   Yes [provider]  pantoprazole (PROTONIX) 40 MG tablet Take 40 mg by mouth 2 (two) times daily. 04/08/21  Yes [provider]  potassium chloride SA (KLOR-CON) 20 MEQ tablet Take 2 tablets (40 mEq total) by mouth daily. Patient taking differently: Take 40 mEq by mouth 2 (two) times daily. 02/15/21  Yes Arby Barrette, MD  promethazine (PHENERGAN) 25 MG suppository Place 1 suppository (25 mg total) rectally every 6 (six) hours as needed for nausea or vomiting. 02/15/21  Yes Arby Barrette, MD  promethazine (PHENERGAN) 25 MG tablet Take 1 tablet (25 mg total) by mouth every 6 (six) hours as needed for nausea or vomiting. 02/15/21  Yes Arby Barrette, MD  topiramate (TOPAMAX) 50 MG tablet Take 50 mg by mouth 3 (three) times daily. 01/11/21  Yes [provider]  vitamin B-12 (CYANOCOBALAMIN) 1000 MCG tablet Take 1,000 mcg by mouth daily.   Yes [provider]  VITAMIN D PO Take 2,000 Units by mouth daily.   Yes [provider]  zinc gluconate 50 MG tablet Take 50 mg by mouth daily.   Yes [provider]  ondansetron (ZOFRAN ODT) 4 MG disintegrating tablet Take 1 tablet (4 mg total) by mouth every 8 (eight) hours as needed. Patient not taking: No sig reported 02/27/19   Levert Feinstein, MD  ondansetron (ZOFRAN ODT) 4 MG disintegrating tablet Take 1 tablet (4 mg total) by mouth every 4 (four) hours as needed for nausea or vomiting. Patient not taking: Reported on 04/13/2021  02/15/21   Arby Barrette, MD    ___________________________________________________________________________________________________ Physical Exam: Vitals with BMI 04/13/2021 04/13/2021 04/13/2021  Height - - -  Weight - - -  BMI - - -  Systolic 102 110 962  Diastolic 69 69 72  Pulse 75 85 78     1. General:  in No  Acute distress   Chronically ill   -appearing 2. Psychological: Alert and  Oriented 3. Head/ENT:   Dry Mucous Membranes                          Head Non traumatic, neck supple                           Poor Dentition 4. SKIN:  decreased Skin turgor,  Skin clean Dry and intact no rash 5. Heart: Regular rate and rhythm no  Murmur, no Rub or gallop 6. Lungs:  Clear to auscultation bilaterally, no wheezes or crackles   7. Abdomen: Soft,  non-tender, Non distended bowel sounds present 8. Lower extremities: no clubbing, cyanosis, no  edema 9. Neurologically Grossly intact, moving all 4 extremities equally   10. MSK: Normal range of motion    Chart has been reviewed  ______________________________________________________________________________________________  Assessment/Plan 50 y.o. female with medical history significant of  blood transfusions, SVT, HLD   Admitted for intractable nausea vomiting diarrhea UTI Present on Admission:  Not meeting criteria for sepsis at this time Dehydration we will rehydrate    Attention deficit hyperactivity disorder (ADHD) -given initial complaints of tachycardia will hold off on home medications for tonight   Diarrhea -order gastric panel.   Intractable vomiting with nausea -appreciate GI consult as they have been following patient as an outpatient.  Supportive management at this time order gastric panel.   Lower urinary tract infectious disease -  - treat with Rocephin        await results of urine culture and adjust antibiotic  coverage as needed   Thyroid nodule -check TSH T4 to free   Other plan as per orders.  DVT  prophylaxis:  SCD      Code Status:    Code Status: Prior FULL CODE   as per patient   I had personally discussed CODE STATUS with patient      Family Communication:   Family  at  Bedside  plan of care was discussed   with  Husband,   Disposition Plan:        To home once workup is complete and patient is stable   Following barriers for discharge:                            Electrolytes corrected                               Anemia stable                             Pain controlled with PO medications                               g able to transition to PO antibiotics                             Will need to be able to tolerate PO                                                        Will need consultants to evaluate patient prior to discharge                        Consults called: [facility]  Admission status:  ED Disposition     ED Disposition  Admit   Condition  --   Comment  Hospital Area: [facility] [[id number]]  Level of Care: Progressive [102]  Admit to Progressive based on following criteria: GI, ENDOCRINE disease patients with GI bleeding, acute liver failure or pancreatitis, stable with diabetic ketoacidosis or thyrotoxicosis (hypothyroid) state.  May place patient in observation at [facility] or [facility] if equivalent level of care is available:: No  Covid Evaluation: Confirmed COVID Negative  Diagnosis: Sepsis [facility]) [[id number]]  Admitting Physician: Therisa Doyne [[id number]]  Attending Physician: Therisa Doyne [[id number]]           Obs    Level of care   progressive tele indefinitely please discontinue once patient no longer qualifies COVID-19 Labs    Lab Results  Component Value Date   SARSCOV2NAA NEGATIVE 04/13/2021     Precautions: admitted as  Covid Negative    PPE: Used by the provider:   N95  eye Goggles,  Gloves     Manoah Deckard 04/14/2021, 2:15 AM    [facility]     after 2 AM please page  floor coverage PA If 7AM-7PM, please contact the day team taking care of the patient using [email or web address]   Patient was evaluated in the context of the global COVID-19 pandemic, which necessitated consideration  that the patient might be at risk for infection with the SARS-CoV-2 virus that causes COVID-19. Institutional protocols and algorithms that pertain to the evaluation of patients at risk for COVID-19 are in a state of rapid change based on information released by regulatory bodies including the CDC and federal and state organizations. These policies and algorithms were followed during the patient's care.

## 2021-04-13 NOTE — ED Notes (Signed)
Pt taken to CT.

## 2021-04-13 NOTE — ED Provider Notes (Signed)
I assumed care of patient at shift change from previous team, please see their note for full H&P.  Per PA Belaya "50 year old female with a history of SVT, hyperlipidemia, depression, questionable Addison's disease presents to the ER with complaints of nausea vomiting and diarrhea which has been ongoing for the last week.  She was admitted to the hospital back in January with similar symptoms, found to be in hypovolemic shock, hypotension, leukocytosis, which was thought to maybe be due to Addison's disease.  She has since followed up with endocrinology and has been told that she does not have Addison's disease.  She was seen by her PCP and GI ([facility], [facility] Silverio Lay), noted to have a drop in her hemoglobin to 8.8, and there was concern for GI bleed given her report of coffee-ground emesis.  She has no history of GI bleed, but does endorse a significant history of GERD.  She had plans to have a endoscopy and colonoscopy done within the next few days, but was told that due to her drop in hemoglobin she was not eligible to do so.  Endorses some generalized abdominal pain, locally umbilically.  She also has noticed that her abdomen is a bit more swollen.  Denies any urinary symptoms."  Physical Exam  BP 102/69   Pulse 75   Temp 98.1 F (36.7 C) (Oral)   Resp (!) 23   SpO2 98%   Physical Exam  Patient is lying in bed, she appears to feel unwell.  She has generalized abdominal tenderness, worse in the epigastric area.  No significant CVA tenderness to percussion.  She is able to answer questions appropriately without slurred speech.  ED Course/Procedures   Clinical Course as of 04/14/21 0005  Wed Apr 13, 2021  1754 Patient reevaluated, she ate ice chips and crackers and then had diarrhea on herself in the bed. I discussed possibility of admission.  Patient is very hesitant to this.  Will reevaluate. Ordered repeat POC occult blood given that on earlier exam patient did not have any stool in the rectal  canal, and GI PCR panel and C. difficile screen. [EH]  1943 Patient is still not tolerating p.o.  Will pursue admission [EH]    Clinical Course User Index [EH] Lorin Glass, PA-C     Procedures  CT ABDOMEN PELVIS W CONTRAST  Result Date: 04/13/2021 CLINICAL DATA:  Epigastric pain with nausea, vomiting and diarrhea for 1 week. EXAM: CT ABDOMEN AND PELVIS WITH CONTRAST TECHNIQUE: Multidetector CT imaging of the abdomen and pelvis was performed using the standard protocol following bolus administration of intravenous contrast. CONTRAST:  60m OMNIPAQUE IOHEXOL 350 MG/ML SOLN COMPARISON:  None. FINDINGS: Lower chest: The lung bases are grossly clear. No pulmonary lesions or pleural effusions. The heart is within normal limits in size. No pericardial effusion. There is a small to moderate-sized hiatal hernia noted. Hepatobiliary: No hepatic lesions or intrahepatic biliary dilatation. Gallbladder is unremarkable. No common bile duct dilatation. Pancreas: No mass, inflammation or ductal dilatation. Spleen: Normal size.  No focal lesions. Adrenals/Urinary Tract: The adrenal glands are normal. Small bilateral renal calculi are noted but no obstructing ureteral calculi or bladder calculi. No worrisome renal or bladder lesions. Stomach/Bowel: The stomach, duodenum and small bowel are unremarkable. No acute inflammatory process, mass lesions or obstructive findings. The colon is distended and fluid-filled with air-fluid levels. These findings are typically seen with diarrhea. I do not see any wall thickening or pericolonic inflammatory changes. The rectum and sigmoid colon are  relatively decompressed. Vascular/Lymphatic: The aorta is normal in caliber. No dissection. The branch vessels are patent. The major venous structures are patent. No mesenteric or retroperitoneal mass or adenopathy. Small scattered lymph nodes are noted. Home Reproductive: The uterus and ovaries are unremarkable. Other: No pelvic mass  or adenopathy. No free pelvic fluid collections. No inguinal mass or adenopathy. No abdominal wall hernia or subcutaneous lesions. Musculoskeletal: No significant bony findings. IMPRESSION: 1. Distended and fluid-filled colon with air-fluid levels typically seen with diarrhea. No colonic wall thickening or pericolonic inflammatory changes. The stomach and small bowel unremarkable. 2. Small bilateral renal calculi but no obstructing ureteral calculi or bladder calculi. 3. Small to moderate-sized hiatal hernia. Electronically Signed   By: Marijo Sanes M.D.   On: 04/13/2021 16:35    Labs Reviewed  COMPREHENSIVE METABOLIC PANEL - Abnormal; Notable for the following components:      Result Value   Calcium 8.7 (*)    All other components within normal limits  CBC - Abnormal; Notable for the following components:   RBC 3.73 (*)    Hemoglobin 9.3 (*)    HCT 31.3 (*)    MCH 24.9 (*)    MCHC 29.7 (*)    RDW 16.1 (*)    Platelets 496 (*)    All other components within normal limits  URINALYSIS, ROUTINE W REFLEX MICROSCOPIC - Abnormal; Notable for the following components:   Color, Urine STRAW (*)    APPearance CLOUDY (*)    Specific Gravity, Urine 1.003 (*)    Hgb urine dipstick SMALL (*)    Nitrite POSITIVE (*)    Leukocytes,Ua LARGE (*)    WBC, UA >50 (*)    Bacteria, UA MANY (*)    All other components within normal limits  RESP PANEL BY RT-PCR (FLU A&B, COVID) ARPGX2  URINE CULTURE  CULTURE, BLOOD (ROUTINE X 2)  CULTURE, BLOOD (ROUTINE X 2)  GASTROINTESTINAL PANEL BY PCR, STOOL (REPLACES STOOL CULTURE)  C DIFFICILE QUICK SCREEN W PCR REFLEX    LIPASE, BLOOD  MAGNESIUM  CK  PROCALCITONIN  CBC WITH DIFFERENTIAL/PLATELET  RAPID URINE DRUG SCREEN, HOSP PERFORMED  VITAMIN B12  FOLATE  IRON AND TIBC  FERRITIN  RETICULOCYTES  POC OCCULT BLOOD, ED  POC OCCULT BLOOD, ED  TYPE AND SCREEN    Medications  lactated ringers infusion (has no administration in time range)  promethazine  (PHENERGAN) 12.5 mg in sodium chloride 0.9 % 50 mL IVPB (has no administration in time range)  cefTRIAXone (ROCEPHIN) 2 g in sodium chloride 0.9 % 100 mL IVPB (has no administration in time range)  metoCLOPramide (REGLAN) injection 5 mg (has no administration in time range)  sodium chloride 0.9 % bolus 1,000 mL (1,000 mLs Intravenous Bolus 04/13/21 1233)  ondansetron (ZOFRAN) injection 4 mg (4 mg Intravenous Given 04/13/21 1233)  morphine 2 MG/ML injection 2 mg (2 mg Intravenous Given 04/13/21 1352)  sodium chloride 0.9 % bolus 500 mL (500 mLs Intravenous Bolus 04/13/21 1626)  iohexol (OMNIPAQUE) 350 MG/ML injection 100 mL (80 mLs Intravenous Contrast Given 04/13/21 1558)  fentaNYL (SUBLIMAZE) injection 50 mcg (50 mcg Intravenous Given 04/13/21 1822)     MDM   Patient is a 50 year old woman who presents today for evaluation of 1 week of worsening vomiting and diarrhea.  She was originally referred in by her GI doctor for possible anemia.  Here her initial Hemoccult was negative however there was no stool in the rectum per previous team.  While in the emergency  room she did have diarrhea, and this was tested and was again negative for occult.  Hemoglobin is slightly low at 9.3, however when compared to her baseline it appears that that is consistent with her baseline.  CMP is without significant abnormalities.  Her white count is 10.  She does appear to have a UTI.  She is not having any urinary symptoms, however she has 11-20 red cells, over 50 white cells with many bacteria and only 0-5 squamous epithelial cells.  She does not have any CVA tenderness to percussion, no evidence of pyelonephritis. CT abdomen pelvis shows distended fluid-filled loops without evidence of secondary inflammation.  She did have soft blood pressures while in the emergency room, as low as 90/53.  She was treated with IV fluids with improvement in her pressures. Given the degree of patient's nausea vomiting diarrhea, and her  continued inability to tolerate p.o. while in the emergency room she will require admission. Additionally she appears to have a UTI at this point and I do not think that she would be able to tolerate p.o. antibiotics. I spoke with hospitalist who will see the patient for admission.      Lorin Glass, PA-C 04/14/21 0009    Arnaldo Natal, MD 04/14/21 1704

## 2021-04-13 NOTE — ED Notes (Signed)
Bladder scan completed with largest volume of 195 mL.

## 2021-04-13 NOTE — ED Triage Notes (Addendum)
C/O N/V X1 week.  Reports going to PCP and told her hemoglobin was low (8.9) and worried for GI bleed. GI told patient to come to ED.   5/10 generalized abdominal pain    Hx: septic shock and blood transfusions    A/Ox4 Ambulatory in triage

## 2021-04-13 NOTE — ED Provider Notes (Signed)
[facility] DEPT Provider Note   CSN: [id number] Arrival date & time: 04/13/21  1109     History Chief Complaint  Patient presents with   Abdominal Pain   Abnormal Lab    Emily Park is a 50 y.o. female.  HPI 50 year old female with a history of SVT, hyperlipidemia, depression, questionable Addison's disease presents to the ER with complaints of nausea vomiting and diarrhea which has been ongoing for the last week.  She was admitted to the hospital back in January with similar symptoms, found to be in hypovolemic shock, hypotension, leukocytosis, which was thought to maybe be due to Addison's disease.  She has since followed up with endocrinology and has been told that she does not have Addison's disease.  She was seen by her PCP and GI ([facility] Silverio Lay), noted to have a drop in her hemoglobin to 8.8, and there was concern for GI bleed given her report of coffee-ground emesis.  She has no history of GI bleed, but does endorse a significant history of GERD.  She had plans to have a endoscopy and colonoscopy done within the next few days, but was told that due to her drop in hemoglobin she was not eligible to do so.  Endorses some generalized abdominal pain, locally umbilically.  She also has noticed that her abdomen is a bit more swollen.  Denies any urinary symptoms.    Past Medical History:  Diagnosis Date   Abnormal TSH    ADD (attention deficit disorder)    Anemia    Anxiety    Arrhythmia    Decreased libido    Depression    Heart murmur    Hyperlipidemia    Hypokalemia    Migraine    Pap smear abnormality of cervix/human papillomavirus (HPV) positive    Sleep difficulties    SVT (supraventricular tachycardia) (HCC)     Patient Active Problem List   Diagnosis Date Noted   Thyroid nodule 10/26/2020   AKI (acute kidney injury) ([place])    Hypotension    Hypovolemic shock ([place]) 08/29/2020   Migraine 02/27/2019   Lung nodules 11/14/2015    Paroxysmal SVT (supraventricular tachycardia) ([place]) 11/13/2015   Attention deficit hyperactivity disorder (ADHD) 11/13/2015    Past Surgical History:  Procedure Laterality Date   ABLATION     Cardiac ablation, endometrial ablation   BREAST LUMPECTOMY     right breast   BREAST LUMPECTOMY Right    CARDIOVERSION     COLPOSCOPY     w/ cervical biopsy   DILITATION & CURRETTAGE/HYSTROSCOPY WITH NOVASURE ABLATION N/A 09/11/2014   Procedure: DILATATION & CURETTAGE/HYSTEROSCOPY WITH NOVASURE ABLATION;  Surgeon: Lovenia Kim, MD;  Location: [facility];  Service: Gynecology;  Laterality: N/A;     OB History     Gravida      Para      Term      Preterm      AB      Living  2      SAB      IAB      Ectopic      Multiple      Live Births              Family History  Problem Relation Age of Onset   Hypothyroidism Mother    Depression Mother    Cancer Mother        thyroid   Thyroid disease Mother    Hypertension Father  Heart attack Father        Age 33   AAA (abdominal aortic aneurysm) Father    Hypothyroidism Sister    Hyperthyroidism Sister    Diabetes Maternal Grandfather    Breast cancer Paternal Grandmother     Social History   Tobacco Use   Smoking status: Never    Passive exposure: Current   Smokeless tobacco: Never  Vaping Use   Vaping Use: Never used  Substance Use Topics   Alcohol use: Yes    Alcohol/week: 2.0 standard drinks    Types: 2 Glasses of wine per week    Comment: occasional wine   Drug use: Not Currently    Home Medications Prior to Admission medications   Medication Sig Start Date End Date Taking? Authorizing Provider  amphetamine-dextroamphetamine (ADDERALL XR) 30 MG 24 hr capsule Take 30 mg by mouth daily as needed (ADHD).    [provider]  Ascorbic Acid (VITAMIN C PO) Take 1 tablet by mouth daily.    [provider]  BIOTIN PO Take 1 tablet by mouth daily.    [provider]  clonazePAM  (KLONOPIN) 1 MG tablet Take 1 mg by mouth 3 (three) times daily as needed. 01/20/21   [provider]  Cyanocobalamin (VITAMIN B-12 PO) Take 1 tablet by mouth daily.    [provider]  doxepin (SINEQUAN) 75 MG capsule Take 75 mg by mouth at bedtime. 12/22/20   [provider]  FLUoxetine (PROZAC) 40 MG capsule Take 40 mg by mouth daily.    [provider]  MAGNESIUM PO Take 1 tablet by mouth daily.    [provider]  Multiple Vitamin (MULTIVITAMIN PO) Take 1 tablet by mouth daily.    [provider]  Multiple Vitamins-Minerals (ZINC PO) Take 1 tablet by mouth daily.    [provider]  ondansetron (ZOFRAN ODT) 4 MG disintegrating tablet Take 1 tablet (4 mg total) by mouth every 8 (eight) hours as needed. Patient not taking: No sig reported 02/27/19   Marcial Pacas, MD  ondansetron (ZOFRAN ODT) 4 MG disintegrating tablet Take 1 tablet (4 mg total) by mouth every 4 (four) hours as needed for nausea or vomiting. 02/15/21   Charlesetta Shanks, MD  pantoprazole (PROTONIX) 20 MG tablet Take 1 tablet (20 mg total) by mouth daily. 02/15/21   Charlesetta Shanks, MD  potassium chloride SA (KLOR-CON) 20 MEQ tablet Take 2 tablets (40 mEq total) by mouth daily. 02/15/21   Charlesetta Shanks, MD  promethazine (PHENERGAN) 25 MG suppository Place 1 suppository (25 mg total) rectally every 6 (six) hours as needed for nausea or vomiting. 02/15/21   Charlesetta Shanks, MD  promethazine (PHENERGAN) 25 MG tablet Take 1 tablet (25 mg total) by mouth every 6 (six) hours as needed for nausea or vomiting. 02/15/21   Charlesetta Shanks, MD  topiramate (TOPAMAX) 50 MG tablet Take 50 mg by mouth 3 (three) times daily. 01/11/21   [provider]  VITAMIN D PO Take 1 tablet by mouth daily.    [provider]    Allergies    Amoxicillin, Bactrim [sulfamethoxazole-trimethoprim], and Zithromax [azithromycin]  Review of Systems   Review of Systems Ten systems reviewed and  are negative for acute change, except as noted in the HPI.   Physical Exam Updated Vital Signs BP 101/65   Pulse 79   Temp 98.1 F (36.7 C) (Oral)   Resp 18   SpO2 98%   Physical Exam Vitals and nursing note  reviewed.  Constitutional:      General: She is not in acute distress.    Appearance: She is well-developed.  HENT:     Head: Normocephalic and atraumatic.  Eyes:     Conjunctiva/sclera: Conjunctivae normal.  Cardiovascular:     Rate and Rhythm: Normal rate and regular rhythm.     Heart sounds: No murmur heard. Pulmonary:     Effort: Pulmonary effort is normal. No respiratory distress.     Breath sounds: Normal breath sounds.  Abdominal:     General: Abdomen is protuberant. Bowel sounds are increased.     Palpations: Abdomen is soft.     Tenderness: There is abdominal tenderness. There is no guarding.     Comments: Generalized abdominal tenderness with distention, no focal tenderness, no peritoneal signs, no CVA tenderness  Genitourinary:    Comments: Rectal exam without any frank bright red blood, not able to acquire any stool for point-of-care occult blood test Musculoskeletal:     Cervical back: Neck supple.  Skin:    General: Skin is warm and dry.  Neurological:     General: No focal deficit present.     Mental Status: She is alert.    ED Results / Procedures / Treatments   Labs (all labs ordered are listed, but only abnormal results are displayed) Labs Reviewed  COMPREHENSIVE METABOLIC PANEL - Abnormal; Notable for the following components:      Result Value   Calcium 8.7 (*)    All other components within normal limits  CBC - Abnormal; Notable for the following components:   RBC 3.73 (*)    Hemoglobin 9.3 (*)    HCT 31.3 (*)    MCH 24.9 (*)    MCHC 29.7 (*)    RDW 16.1 (*)    Platelets 496 (*)    All other components within normal limits  RESP PANEL BY RT-PCR (FLU A&B, COVID) ARPGX2  LIPASE, BLOOD  URINALYSIS, ROUTINE W REFLEX MICROSCOPIC  POC  OCCULT BLOOD, ED  TYPE AND SCREEN    EKG None  Radiology No results found.  Procedures Procedures   Medications Ordered in ED Medications  sodium chloride 0.9 % bolus 1,000 mL (1,000 mLs Intravenous Bolus 04/13/21 1233)  ondansetron (ZOFRAN) injection 4 mg (4 mg Intravenous Given 04/13/21 1233)  morphine 2 MG/ML injection 2 mg (2 mg Intravenous Given 04/13/21 1352)    ED Course  I have reviewed the triage vital signs and the nursing notes.  Pertinent labs & imaging results that were available during my care of the patient were reviewed by me and considered in my medical decision making (see chart for details).    MDM Rules/Calculators/A&P                           50 year old female presents to the ER with dark vomit and diarrhea, sent by GI to rule out GI bleed.  On arrival, she is well-appearing, no acute distress, resting comfortably in the ER bed.  Vitals overall reassuring, originally tachycardic on arrival but this improved throughout the ED course.  Afebrile, normotensive.  Physical exam with distended abdomen, with generalized tenderness, but no peritoneal signs.  Was not able to obtain any stool sample on rectal exam but did not see any evidence of frank blood.  Labs ordered, reviewed and interpreted by me. CBC without leukocytosis, hemoglobin of 9.3 which appears to be stable.  CMP without any electrolyte abnormalities, normal renal function,  normal liver function test.  I was not able to procure any stool on rectal exam, however no evidence of bright red blood, large hemorrhoids.  Patient received morphine, fluids, Zofran.  Plan for CT of the abdomen pelvis, following up on her UA, p.o. challenging.  COVID test pending.  If this is all normal, suspect she will be stable for discharge with follow-up with GI.  Care signed out to [facility] who will oversee the rest of her workup and dispo accordingly   Final Clinical Impression(s) / ED Diagnoses Final diagnoses:  None     Rx / DC Orders ED Discharge Orders     None        Garald Balding, PA-C 04/13/21 1445    Gareth Morgan, MD 04/15/21 [phone]

## 2021-04-14 ENCOUNTER — Other Ambulatory Visit: Payer: Self-pay

## 2021-04-14 ENCOUNTER — Encounter (HOSPITAL_COMMUNITY): Payer: Self-pay | Admitting: Internal Medicine

## 2021-04-14 DIAGNOSIS — N390 Urinary tract infection, site not specified: Secondary | ICD-10-CM | POA: Diagnosis present

## 2021-04-14 DIAGNOSIS — R197 Diarrhea, unspecified: Secondary | ICD-10-CM | POA: Diagnosis present

## 2021-04-14 DIAGNOSIS — R112 Nausea with vomiting, unspecified: Secondary | ICD-10-CM | POA: Diagnosis present

## 2021-04-14 LAB — CBC WITH DIFFERENTIAL/PLATELET
Abs Immature Granulocytes: 0.02 10*3/uL (ref 0.00–0.07)
Basophils Absolute: 0 10*3/uL (ref 0.0–0.1)
Basophils Relative: 0 %
Eosinophils Absolute: 0.4 10*3/uL (ref 0.0–0.5)
Eosinophils Relative: 5 %
HCT: 28.3 % — ABNORMAL LOW (ref 36.0–46.0)
Hemoglobin: 8.2 g/dL — ABNORMAL LOW (ref 12.0–15.0)
Immature Granulocytes: 0 %
Lymphocytes Relative: 31 %
Lymphs Abs: 2.2 10*3/uL (ref 0.7–4.0)
MCH: 24.9 pg — ABNORMAL LOW (ref 26.0–34.0)
MCHC: 29 g/dL — ABNORMAL LOW (ref 30.0–36.0)
MCV: 86 fL (ref 80.0–100.0)
Monocytes Absolute: 0.5 10*3/uL (ref 0.1–1.0)
Monocytes Relative: 7 %
Neutro Abs: 3.9 10*3/uL (ref 1.7–7.7)
Neutrophils Relative %: 57 %
Platelets: 408 10*3/uL — ABNORMAL HIGH (ref 150–400)
RBC: 3.29 MIL/uL — ABNORMAL LOW (ref 3.87–5.11)
RDW: 16 % — ABNORMAL HIGH (ref 11.5–15.5)
WBC: 6.9 10*3/uL (ref 4.0–10.5)
nRBC: 0 % (ref 0.0–0.2)

## 2021-04-14 LAB — COMPREHENSIVE METABOLIC PANEL
ALT: 14 U/L (ref 0–44)
AST: 17 U/L (ref 15–41)
Albumin: 3.3 g/dL — ABNORMAL LOW (ref 3.5–5.0)
Alkaline Phosphatase: 50 U/L (ref 38–126)
Anion gap: 5 (ref 5–15)
BUN: 7 mg/dL (ref 6–20)
CO2: 19 mmol/L — ABNORMAL LOW (ref 22–32)
Calcium: 8.1 mg/dL — ABNORMAL LOW (ref 8.9–10.3)
Chloride: 113 mmol/L — ABNORMAL HIGH (ref 98–111)
Creatinine, Ser: 0.78 mg/dL (ref 0.44–1.00)
GFR, Estimated: >60 mL/min (ref >60)
Glucose, Bld: 84 mg/dL (ref 70–99)
Potassium: 3.4 mmol/L — ABNORMAL LOW (ref 3.5–5.1)
Sodium: 137 mmol/L (ref 135–145)
Total Bilirubin: 0.3 mg/dL (ref 0.3–1.2)
Total Protein: 6.5 g/dL (ref 6.5–8.1)

## 2021-04-14 LAB — RAPID URINE DRUG SCREEN, HOSP PERFORMED
Amphetamines: NOT DETECTED
Barbiturates: NOT DETECTED
Benzodiazepines: POSITIVE — AB
Cocaine: NOT DETECTED
Opiates: NOT DETECTED
Tetrahydrocannabinol: NOT DETECTED

## 2021-04-14 LAB — FERRITIN: Ferritin: 5 ng/mL — ABNORMAL LOW (ref 11–307)

## 2021-04-14 LAB — RETICULOCYTES
Immature Retic Fract: 18.9 % — ABNORMAL HIGH (ref 2.3–15.9)
RBC.: 3.29 MIL/uL — ABNORMAL LOW (ref 3.87–5.11)
Retic Count, Absolute: 50.7 10*3/uL (ref 19.0–186.0)
Retic Ct Pct: 1.5 % (ref 0.4–3.1)

## 2021-04-14 LAB — IRON AND TIBC
Iron: 20 ug/dL — ABNORMAL LOW (ref 28–170)
Saturation Ratios: 5 % — ABNORMAL LOW (ref 10.4–31.8)
TIBC: 387 ug/dL (ref 250–450)
UIBC: 367 ug/dL

## 2021-04-14 LAB — PHOSPHORUS: Phosphorus: 2.8 mg/dL (ref 2.5–4.6)

## 2021-04-14 LAB — FOLATE: Folate: 29.9 ng/mL (ref >5.9)

## 2021-04-14 LAB — T4, FREE: Free T4: 1.03 ng/dL (ref 0.61–1.12)

## 2021-04-14 LAB — VITAMIN B12: Vitamin B-12: 823 pg/mL (ref 180–914)

## 2021-04-14 LAB — CK: Total CK: 48 U/L (ref 38–234)

## 2021-04-14 LAB — MAGNESIUM: Magnesium: 1.9 mg/dL (ref 1.7–2.4)

## 2021-04-14 LAB — PROCALCITONIN: Procalcitonin: <0.1 ng/mL

## 2021-04-14 MED ORDER — FLUOXETINE HCL 20 MG PO CAPS
60.0000 mg | ORAL_CAPSULE | Freq: Every morning | ORAL | Status: DC
  Administered 2021-04-14: 60 mg via ORAL
  Filled 2021-04-14: qty 3

## 2021-04-14 MED ORDER — SODIUM CHLORIDE 0.9 % IV BOLUS
1000.0000 mL | Freq: Once | INTRAVENOUS | Status: AC
  Administered 2021-04-14: 1000 mL via INTRAVENOUS

## 2021-04-14 MED ORDER — SODIUM CHLORIDE 0.9 % IV SOLN: INTRAVENOUS | Status: DC

## 2021-04-14 MED ORDER — TOPIRAMATE 25 MG PO TABS
50.0000 mg | ORAL_TABLET | Freq: Three times a day (TID) | ORAL | Status: DC
  Administered 2021-04-14: 50 mg via ORAL
  Filled 2021-04-14 (×2): qty 2

## 2021-04-14 MED ORDER — HYDROCODONE-ACETAMINOPHEN 5-325 MG PO TABS: 1.0000 | ORAL_TABLET | ORAL | Status: DC | PRN

## 2021-04-14 MED ORDER — DOXEPIN HCL 50 MG PO CAPS
75.0000 mg | ORAL_CAPSULE | Freq: Every day | ORAL | Status: DC
  Administered 2021-04-14: 75 mg via ORAL
  Filled 2021-04-14: qty 1

## 2021-04-14 MED ORDER — FENTANYL CITRATE (PF) 100 MCG/2ML IJ SOLN
50.0000 ug | INTRAMUSCULAR | Status: DC | PRN
Start: 2021-04-14 — End: 2021-04-14
  Administered 2021-04-14 (×2): 50 ug via INTRAVENOUS
  Filled 2021-04-14 (×2): qty 2

## 2021-04-14 MED ORDER — ACETAMINOPHEN 325 MG PO TABS: 650.0000 mg | ORAL_TABLET | Freq: Four times a day (QID) | ORAL | Status: DC | PRN

## 2021-04-14 MED ORDER — NITROFURANTOIN MONOHYD MACRO 100 MG PO CAPS: 100.0000 mg | ORAL_CAPSULE | Freq: Two times a day (BID) | ORAL | 0 refills | Status: AC

## 2021-04-14 MED ORDER — SODIUM CHLORIDE 0.9 % IV SOLN
75.0000 mL/h | INTRAVENOUS | Status: DC
  Administered 2021-04-14: 75 mL/h via INTRAVENOUS

## 2021-04-14 MED ORDER — POTASSIUM CHLORIDE CRYS ER 20 MEQ PO TBCR
40.0000 meq | EXTENDED_RELEASE_TABLET | Freq: Every day | ORAL | Status: DC
  Administered 2021-04-14: 40 meq via ORAL
  Filled 2021-04-14: qty 2

## 2021-04-14 MED ORDER — PANTOPRAZOLE SODIUM 40 MG IV SOLR
40.0000 mg | Freq: Two times a day (BID) | INTRAVENOUS | Status: DC
  Administered 2021-04-14 (×2): 40 mg via INTRAVENOUS
  Filled 2021-04-14 (×2): qty 40

## 2021-04-14 MED ORDER — ACETAMINOPHEN 650 MG RE SUPP: 650.0000 mg | Freq: Four times a day (QID) | RECTAL | Status: DC | PRN

## 2021-04-14 MED ORDER — CLONAZEPAM 1 MG PO TABS
1.0000 mg | ORAL_TABLET | Freq: Every day | ORAL | Status: DC
  Administered 2021-04-14: 1 mg via ORAL
  Filled 2021-04-14: qty 1

## 2021-04-14 NOTE — Plan of Care (Signed)
  Problem: Education: Goal: Knowledge of General Education information will improve Description: Including pain rating scale, medication(s)/side effects and non-pharmacologic comfort measures Outcome: Adequate for Discharge   Problem: Health Behavior/Discharge Planning: Goal: Ability to manage health-related needs will improve Outcome: Adequate for Discharge   Problem: Nutrition: Goal: Adequate nutrition will be maintained Outcome: Adequate for Discharge   Problem: Coping: Goal: Level of anxiety will decrease Outcome: Adequate for Discharge   Problem: Elimination: Goal: Will not experience complications related to bowel motility Outcome: Adequate for Discharge Goal: Will not experience complications related to urinary retention Outcome: Adequate for Discharge   Problem: Pain Managment: Goal: General experience of comfort will improve Outcome: Adequate for Discharge   Problem: Safety: Goal: Ability to remain free from injury will improve Outcome: Adequate for Discharge   Problem: Skin Integrity: Goal: Risk for impaired skin integrity will decrease Outcome: Adequate for Discharge

## 2021-04-14 NOTE — Hospital Course (Signed)
50 year old female with a history of SVT, hyperlipidemia, depression, questionable Addison's disease presents to the ER with complaints of nausea vomiting and diarrhea which has been ongoing for the last week.  She was admitted to the hospital back in January with similar symptoms, found to be in hypovolemic shock, hypotension, leukocytosis, which was thought to maybe be due to Addison's disease.  She has since followed up with endocrinology and has been told that she does not have Addison's disease.  She was seen by her PCP and GI ([facility] Silverio Lay), noted to have a drop in her hemoglobin to 8.8, and there was concern for GI bleed given her report of coffee-ground emesis.  She has no history of GI bleed, but does endorse a significant history of GERD.  She had plans to have a endoscopy and colonoscopy done within the next few days, but was told that due to her drop in hemoglobin she was not eligible to do so.  Endorses some generalized abdominal pain, locally umbilically.  She also has noticed that her abdomen is a bit more swollen.  Denies any urinary symptoms.

## 2021-04-14 NOTE — Discharge Summary (Addendum)
Physician Discharge Summary  [facility] [id number] DOB: 1970-10-08 DOA: 04/13/2021  PCP: [name], MD  Admit date: 04/13/2021 Discharge date: 04/14/2021  Recommendations for Outpatient Follow-up:  Recurrent nausea vomiting and diarrhea, anemia with gastroenterology tomorrow Consider iron supplementation in the future and further evaluation of anemia   Follow-up Information     [name], MD. Schedule an appointment as soon as possible for a visit in 2 week(s).   Specialty: Internal Medicine Contact information: [address] [address] [phone]         [name], MD Follow up on 04/15/2021.   Specialty: Gastroenterology Why: 3 pm Contact information: [address] [place] [address] [phone]                  Discharge Diagnoses: Principal diagnosis is #1 Nausea, vomiting and diarrhea, periodic, recurrent of unclear etiology Iron deficiency Normocytic anemia UTI Thyroid abnormality   Discharge Condition: improved Disposition: home  Diet recommendation:  Diet Orders (From admission, onward)     Start     Ordered   04/13/21 2115  Diet clear liquid Room service appropriate? Yes; Fluid consistency: Thin  Diet effective now       Question Answer Comment  Room service appropriate? Yes   Fluid consistency: Thin      04/13/21 2115             Filed Weights   04/14/21 0047  Weight: 64.5 kg    HPI/Hospital Course:   50 year old woman presenting with nausea, vomiting and diarrhea.  She reports recurrent symptoms over the last several months stretching back perhaps 2 January.  Had just established with [facility] but had not yet had any work-up done.  No history of bleeding.  Admitted for further evaluation of same.  Nausea vomiting resolved and she tolerated a diet the following morning.  Imaging was unremarkable as was lab work and general which did however reveal anemia.  No signs or symptoms of  infection present and chronic issues appear normal.  She was seen by gastroenterology with recommendation for discharge home with outpatient follow-up tomorrow for EGD and colonoscopy.  In future will need iron.  Treated for UTI with urinary hesitancy.  Discussed in detail with husband.  Both agreed with discharge home.  Consults:  GI  Today's assessment: S: CC: f/u diarrhea  No vomiting today, tolerated a biscuit this morning brought in by her husband.  She reports intermittent vomiting and diarrhea since at least January.  Currently feeling better.  O: Vitals:  Vitals:   04/14/21 1300 04/14/21 1400  BP:    Pulse:    Resp: 16 17  Temp:    SpO2:      Constitutional:  Appears calm and comfortable Respiratory:  CTA bilaterally, no w/r/r.  Respiratory effort normal.  Cardiovascular:  RRR, no m/r/g Abdomen:  Soft, ntnd Psychiatric:  Mental status Mood, affect appropriate  Complete metabolic panel unremarkable Ferritin 5 Procalcitonin negative Hemoglobin 8.2  Discharge Instructions  Discharge Instructions     Discharge instructions   Complete by: As directed    Call your physician or seek immediate medical attention for pain, swelling, fever, abdominal pain, vomiting or worsening of condition.   Increase activity slowly   Complete by: As directed       Allergies as of 04/14/2021       Reactions   Amoxicillin Rash   Bactrim [sulfamethoxazole-trimethoprim] Rash   Rash noted to chest and  back   Zithromax [azithromycin] Rash        Medication List     TAKE these medications    amphetamine-dextroamphetamine 30 MG 24 hr capsule Commonly known as: ADDERALL XR Take 30 mg by mouth daily as needed (ADHD).   BIOTIN PO Take 1 tablet by mouth daily.   clonazePAM 1 MG tablet Commonly known as: KLONOPIN Take 1 mg by mouth at bedtime.   doxepin 75 MG capsule Commonly known as: SINEQUAN Take 75 mg by mouth at bedtime.   FLUoxetine 20 MG capsule Commonly  known as: PROZAC Take 60 mg by mouth every morning.   MAGNESIUM PO Take 1 tablet by mouth daily.   MULTIVITAMIN PO Take 1 tablet by mouth daily.   nitrofurantoin (macrocrystal-monohydrate) 100 MG capsule Commonly known as: Macrobid Take 1 capsule (100 mg total) by mouth 2 (two) times daily for 4 days.   ondansetron 4 MG disintegrating tablet Commonly known as: Zofran ODT Take 1 tablet (4 mg total) by mouth every 8 (eight) hours as needed. What changed: Another medication with the same name was removed. Continue taking this medication, and follow the directions you see here.   pantoprazole 40 MG tablet Commonly known as: PROTONIX Take 40 mg by mouth 2 (two) times daily.   potassium chloride SA 20 MEQ tablet Commonly known as: KLOR-CON Take 2 tablets (40 mEq total) by mouth daily. What changed: when to take this   promethazine 25 MG tablet Commonly known as: PHENERGAN Take 1 tablet (25 mg total) by mouth every 6 (six) hours as needed for nausea or vomiting.   promethazine 25 MG suppository Commonly known as: PHENERGAN Place 1 suppository (25 mg total) rectally every 6 (six) hours as needed for nausea or vomiting.   topiramate 50 MG tablet Commonly known as: TOPAMAX Take 50 mg by mouth 3 (three) times daily.   vitamin B-12 1000 MCG tablet Commonly known as: CYANOCOBALAMIN Take 1,000 mcg by mouth daily.   VITAMIN C PO Take 1 tablet by mouth daily.   VITAMIN D PO Take 2,000 Units by mouth daily.   zinc gluconate 50 MG tablet Take 50 mg by mouth daily.       Allergies  Allergen Reactions   Amoxicillin Rash   Bactrim [Sulfamethoxazole-Trimethoprim] Rash    Rash noted to chest and back   Zithromax [Azithromycin] Rash    The results of significant diagnostics from this hospitalization (including imaging, microbiology, ancillary and laboratory) are listed below for reference.    Significant Diagnostic Studies: CT ABDOMEN PELVIS W CONTRAST  Result Date:  04/13/2021 CLINICAL DATA:  Epigastric pain with nausea, vomiting and diarrhea for 1 week. EXAM: CT ABDOMEN AND PELVIS WITH CONTRAST TECHNIQUE: Multidetector CT imaging of the abdomen and pelvis was performed using the standard protocol following bolus administration of intravenous contrast. CONTRAST:  49m OMNIPAQUE IOHEXOL 350 MG/ML SOLN COMPARISON:  None. FINDINGS: Lower chest: The lung bases are grossly clear. No pulmonary lesions or pleural effusions. The heart is within normal limits in size. No pericardial effusion. There is a small to moderate-sized hiatal hernia noted. Hepatobiliary: No hepatic lesions or intrahepatic biliary dilatation. Gallbladder is unremarkable. No common bile duct dilatation. Pancreas: No mass, inflammation or ductal dilatation. Spleen: Normal size.  No focal lesions. Adrenals/Urinary Tract: The adrenal glands are normal. Small bilateral renal calculi are noted but no obstructing ureteral calculi or bladder calculi. No worrisome renal or bladder lesions. Stomach/Bowel: The stomach, duodenum and small bowel are unremarkable. No acute inflammatory process,  mass lesions or obstructive findings. The colon is distended and fluid-filled with air-fluid levels. These findings are typically seen with diarrhea. I do not see any wall thickening or pericolonic inflammatory changes. The rectum and sigmoid colon are relatively decompressed. Vascular/Lymphatic: The aorta is normal in caliber. No dissection. The branch vessels are patent. The major venous structures are patent. No mesenteric or retroperitoneal mass or adenopathy. Small scattered lymph nodes are noted. Home Reproductive: The uterus and ovaries are unremarkable. Other: No pelvic mass or adenopathy. No free pelvic fluid collections. No inguinal mass or adenopathy. No abdominal wall hernia or subcutaneous lesions. Musculoskeletal: No significant bony findings. IMPRESSION: 1. Distended and fluid-filled colon with air-fluid levels typically  seen with diarrhea. No colonic wall thickening or pericolonic inflammatory changes. The stomach and small bowel unremarkable. 2. Small bilateral renal calculi but no obstructing ureteral calculi or bladder calculi. 3. Small to moderate-sized hiatal hernia. Electronically Signed   By: Marijo Sanes M.D.   On: 04/13/2021 16:35    Microbiology: Recent Results (from the past 240 hour(s))  Resp Panel by RT-PCR (Flu A&B, Covid) Nasopharyngeal Swab     Status: None   Collection Time: 04/13/21  2:13 PM   Specimen: Nasopharyngeal Swab; Nasopharyngeal(NP) swabs in vial transport medium  Result Value Ref Range Status   SARS Coronavirus 2 by RT PCR NEGATIVE NEGATIVE Final    Comment: (NOTE) SARS-CoV-2 target nucleic acids are NOT DETECTED.  The SARS-CoV-2 RNA is generally detectable in upper respiratory specimens during the acute phase of infection. The lowest concentration of SARS-CoV-2 viral copies this assay can detect is 138 copies/mL. A negative result does not preclude SARS-Cov-2 infection and should not be used as the sole basis for treatment or other patient management decisions. A negative result may occur with  improper specimen collection/handling, submission of specimen other than nasopharyngeal swab, presence of viral mutation(s) within the areas targeted by this assay, and inadequate number of viral copies(<138 copies/mL). A negative result must be combined with clinical observations, patient history, and epidemiological information. The expected result is Negative.  Fact Sheet for Patients:  EntrepreneurPulse.com.au  Fact Sheet for Healthcare Providers:  IncredibleEmployment.be  This test is no t yet approved or cleared by the [place] FDA and  has been authorized for detection and/or diagnosis of SARS-CoV-2 by FDA under an Emergency Use Authorization (EUA). This EUA will remain  in effect (meaning this test can be used) for the duration of  the COVID-19 declaration under Section 564(b)(1) of the Act, 21 U.S.C.section 360bbb-3(b)(1), unless the authorization is terminated  or revoked sooner.       Influenza A by PCR NEGATIVE NEGATIVE Final   Influenza B by PCR NEGATIVE NEGATIVE Final    Comment: (NOTE) The Xpert Xpress SARS-CoV-2/FLU/RSV plus assay is intended as an aid in the diagnosis of influenza from Nasopharyngeal swab specimens and should not be used as a sole basis for treatment. Nasal washings and aspirates are unacceptable for Xpert Xpress SARS-CoV-2/FLU/RSV testing.  Fact Sheet for Patients: EntrepreneurPulse.com.au  Fact Sheet for Healthcare Providers: IncredibleEmployment.be  This test is not yet approved or cleared by the [place] FDA and has been authorized for detection and/or diagnosis of SARS-CoV-2 by FDA under an Emergency Use Authorization (EUA). This EUA will remain in effect (meaning this test can be used) for the duration of the COVID-19 declaration under Section 564(b)(1) of the Act, 21 U.S.C. section 360bbb-3(b)(1), unless the authorization is terminated or revoked.  Performed at [facility], [address] [place], [place] [address]   Culture, blood (routine x 2)     Status: None (Preliminary result)   Collection Time: 04/14/21  1:23 AM   Specimen: BLOOD  Result Value Ref Range Status   Specimen Description   Final    BLOOD RIGHT ANTECUBITAL Performed at [facility] [address] [place], [place] [address]    Special Requests   Final    BOTTLES DRAWN AEROBIC ONLY Blood Culture adequate volume Performed at [facility] [address], [place], [place] [address]    Culture   Final    NO GROWTH < 12 HOURS Performed at [facility] [address], [place], [place] [address]    Report Status PENDING  Incomplete  Culture, blood (routine x 2)     Status: None (Preliminary  result)   Collection Time: 04/14/21  1:23 AM   Specimen: BLOOD  Result Value Ref Range Status   Specimen Description   Final    BLOOD RIGHT HAND Performed at [facility] [address], [place], [place] [address]    Special Requests   Final    BOTTLES DRAWN AEROBIC ONLY Blood Culture results may not be optimal due to an inadequate volume of blood received in culture bottles Performed at [facility] [address], [place], [place] [address]    Culture   Final    NO GROWTH < 12 HOURS Performed at [facility] [address] [place], [place] [address]    Report Status PENDING  Incomplete     Labs: Basic Metabolic Panel: Recent Labs  Lab 04/13/21 1155 04/14/21 0123  NA 136 137  K 3.8 3.4*  CL 106 113*  CO2 23 19*  GLUCOSE 97 84  BUN 10 7  CREATININE 0.97 0.78  CALCIUM 8.7* 8.1*  MG  --  1.9  PHOS  --  2.8   Liver Function Tests: Recent Labs  Lab 04/13/21 1155 04/14/21 0123  AST 18 17  ALT 17 14  ALKPHOS 60 50  BILITOT 0.3 0.3  PROT 7.8 6.5  ALBUMIN 4.0 3.3*   Recent Labs  Lab 04/13/21 1155  LIPASE 39    CBC: Recent Labs  Lab 04/13/21 1155 04/14/21 0123  WBC 10.0 6.9  NEUTROABS  --  3.9  HGB 9.3* 8.2*  HCT 31.3* 28.3*  MCV 83.9 86.0  PLT 496* 408*   Cardiac Enzymes: Recent Labs  Lab 04/14/21 0123  CKTOTAL 48    Active Problems:   Attention deficit hyperactivity disorder (ADHD)   Thyroid nodule   Sepsis (HCC)   Diarrhea   Intractable vomiting with nausea   Lower urinary tract infectious disease   Time coordinating discharge: 35 minutes  Signed:  Murray Hodgkins, MD  [facility]  04/14/2021, 7:07 PM

## 2021-04-14 NOTE — Consult Note (Signed)
Reason for Consult: Nausea vomiting diarrhea Referring Physician: Hospital team  Emily Park is an 50 y.o. female.  HPI: Patient seen and examined in her office computer chart and her hospital computer chart reviewed and her case discussed with the ER PA as well as my office PA who saw her and she has had issues for some time and has not been on iron and is currently undergoing work-up for possible thyroid cancer and her diarrhea and constipation seem to alternate and she periodically will throw up old food and a previous CT implies some proctitis but last night's did not show that and she did have a rectal prolapse which is currently better and she has not had any other previous GI work-up and her acid blockers have not been helpful in her case discussed with her husband as well  Past Medical History:  Diagnosis Date   Abnormal TSH    ADD (attention deficit disorder)    Anemia    Anxiety    Arrhythmia    Decreased libido    Depression    Heart murmur    Hyperlipidemia    Hypokalemia    Migraine    Pap smear abnormality of cervix/human papillomavirus (HPV) positive    Sleep difficulties    SVT (supraventricular tachycardia) ([place])     Past Surgical History:  Procedure Laterality Date   ABLATION     Cardiac ablation, endometrial ablation   BREAST LUMPECTOMY     right breast   BREAST LUMPECTOMY Right    CARDIOVERSION     COLPOSCOPY     w/ cervical biopsy   DILITATION & CURRETTAGE/HYSTROSCOPY WITH NOVASURE ABLATION N/A 09/11/2014   Procedure: DILATATION & CURETTAGE/HYSTEROSCOPY WITH NOVASURE ABLATION;  Surgeon: Lovenia Kim, MD;  Location: [facility];  Service: Gynecology;  Laterality: N/A;    Family History  Problem Relation Age of Onset   Hypothyroidism Mother    Depression Mother    Cancer Mother        thyroid   Thyroid disease Mother    Hypertension Father    Heart attack Father        Age 49   AAA (abdominal aortic aneurysm) Father    Hypothyroidism Sister     Hyperthyroidism Sister    Diabetes Maternal Grandfather    Breast cancer Paternal Grandmother     Social History:  reports that she has never smoked. She has been exposed to tobacco smoke. She has never used smokeless tobacco. She reports current alcohol use of about 2.0 standard drinks per week. She reports that she does not currently use drugs.  Allergies:  Allergies  Allergen Reactions   Amoxicillin Rash   Bactrim [Sulfamethoxazole-Trimethoprim] Rash    Rash noted to chest and back   Zithromax [Azithromycin] Rash    Medications: I have reviewed the patient's current medications.  Results for orders placed or performed during the hospital encounter of 04/13/21 (from the past 48 hour(s))  Comprehensive metabolic panel     Status: Abnormal   Collection Time: 04/13/21 11:55 AM  Result Value Ref Range   Sodium 136 135 - 145 mmol/L   Potassium 3.8 3.5 - 5.1 mmol/L   Chloride 106 98 - 111 mmol/L   CO2 23 22 - 32 mmol/L   Glucose, Bld 97 70 - 99 mg/dL    Comment: Glucose reference range applies only to samples taken after fasting for at least 8 hours.   BUN 10 6 - 20 mg/dL   Creatinine, Ser  0.97 0.44 - 1.00 mg/dL   Calcium 8.7 (L) 8.9 - 10.3 mg/dL   Total Protein 7.8 6.5 - 8.1 g/dL   Albumin 4.0 3.5 - 5.0 g/dL   AST 18 15 - 41 U/L   ALT 17 0 - 44 U/L   Alkaline Phosphatase 60 38 - 126 U/L   Total Bilirubin 0.3 0.3 - 1.2 mg/dL   GFR, Estimated >60 >60 mL/min    Comment: (NOTE) Calculated using the CKD-EPI Creatinine Equation (2021)    Anion gap 7 5 - 15    Comment: Performed at [facility], [place] [address] [place], [place] [address]  CBC     Status: Abnormal   Collection Time: 04/13/21 11:55 AM  Result Value Ref Range   WBC 10.0 4.0 - 10.5 K/uL   RBC 3.73 (L) 3.87 - 5.11 MIL/uL   Hemoglobin 9.3 (L) 12.0 - 15.0 g/dL   HCT 31.3 (L) 36.0 - 46.0 %   MCV 83.9 80.0 - 100.0 fL   MCH 24.9 (L) 26.0 - 34.0 pg   MCHC 29.7 (L) 30.0 - 36.0 g/dL   RDW 16.1 (H)  11.5 - 15.5 %   Platelets 496 (H) 150 - 400 K/uL   nRBC 0.0 0.0 - 0.2 %    Comment: Performed at [facility] [place] [address], [place], [place] [address]  Type and screen [place]     Status: None   Collection Time: 04/13/21 11:55 AM  Result Value Ref Range   ABO/RH(D) O POS    Antibody Screen NEG    Sample Expiration      04/16/2021,2359 Performed at [place] [address], [place], [place] [address]   Lipase, blood     Status: None   Collection Time: 04/13/21 11:55 AM  Result Value Ref Range   Lipase 39 11 - 51 U/L    Comment: Performed at [facility], [place] [address], [place], [place] [address]  POC occult blood, ED     Status: None   Collection Time: 04/13/21 12:21 PM  Result Value Ref Range   Fecal Occult Bld NEGATIVE NEGATIVE  Urinalysis, Routine w reflex microscopic Nasopharyngeal Swab     Status: Abnormal   Collection Time: 04/13/21  2:13 PM  Result Value Ref Range   Color, Urine STRAW (A) YELLOW   APPearance CLOUDY (A) CLEAR   Specific Gravity, Urine 1.003 (L) 1.005 - 1.030   pH 6.0 5.0 - 8.0   Glucose, UA NEGATIVE NEGATIVE mg/dL   Hgb urine dipstick SMALL (A) NEGATIVE   Bilirubin Urine NEGATIVE NEGATIVE   Ketones, ur NEGATIVE NEGATIVE mg/dL   Protein, ur NEGATIVE NEGATIVE mg/dL   Nitrite POSITIVE (A) NEGATIVE   Leukocytes,Ua LARGE (A) NEGATIVE   RBC / HPF 11-20 0 - 5 RBC/hpf   WBC, UA >50 (H) 0 - 5 WBC/hpf   Bacteria, UA MANY (A) NONE SEEN   Squamous Epithelial / LPF 0-5 0 - 5   WBC Clumps PRESENT    Mucus PRESENT     Comment: Performed at [facility], [place] [address], [place], [place] [address]  Resp Panel by RT-PCR (Flu A&B, Covid) Nasopharyngeal Swab     Status: None   Collection Time: 04/13/21  2:13 PM   Specimen: Nasopharyngeal Swab; Nasopharyngeal(NP) swabs in vial transport medium  Result Value Ref Range   SARS Coronavirus 2 by RT PCR NEGATIVE  NEGATIVE    Comment: (NOTE) SARS-CoV-2  target nucleic acids are NOT DETECTED.  The SARS-CoV-2 RNA is generally detectable in upper respiratory specimens during the acute phase of infection. The lowest concentration of SARS-CoV-2 viral copies this assay can detect is 138 copies/mL. A negative result does not preclude SARS-Cov-2 infection and should not be used as the sole basis for treatment or other patient management decisions. A negative result may occur with  improper specimen collection/handling, submission of specimen other than nasopharyngeal swab, presence of viral mutation(s) within the areas targeted by this assay, and inadequate number of viral copies(<138 copies/mL). A negative result must be combined with clinical observations, patient history, and epidemiological information. The expected result is Negative.  Fact Sheet for Patients:  EntrepreneurPulse.com.au  Fact Sheet for Healthcare Providers:  IncredibleEmployment.be  This test is no t yet approved or cleared by the [place] FDA and  has been authorized for detection and/or diagnosis of SARS-CoV-2 by FDA under an Emergency Use Authorization (EUA). This EUA will remain  in effect (meaning this test can be used) for the duration of the COVID-19 declaration under Section 564(b)(1) of the Act, 21 U.S.C.section 360bbb-3(b)(1), unless the authorization is terminated  or revoked sooner.       Influenza A by PCR NEGATIVE NEGATIVE   Influenza B by PCR NEGATIVE NEGATIVE    Comment: (NOTE) The Xpert Xpress SARS-CoV-2/FLU/RSV plus assay is intended as an aid in the diagnosis of influenza from Nasopharyngeal swab specimens and should not be used as a sole basis for treatment. Nasal washings and aspirates are unacceptable for Xpert Xpress SARS-CoV-2/FLU/RSV testing.  Fact Sheet for Patients: EntrepreneurPulse.com.au  Fact Sheet for Healthcare  Providers: IncredibleEmployment.be  This test is not yet approved or cleared by the [place] FDA and has been authorized for detection and/or diagnosis of SARS-CoV-2 by FDA under an Emergency Use Authorization (EUA). This EUA will remain in effect (meaning this test can be used) for the duration of the COVID-19 declaration under Section 564(b)(1) of the Act, 21 U.S.C. section 360bbb-3(b)(1), unless the authorization is terminated or revoked.  Performed at [facility], [place] [address], [place], [place] [address]   Urine rapid drug screen (hosp performed)     Status: Abnormal   Collection Time: 04/13/21  2:13 PM  Result Value Ref Range   Opiates NONE DETECTED NONE DETECTED   Cocaine NONE DETECTED NONE DETECTED   Benzodiazepines POSITIVE (A) NONE DETECTED   Amphetamines NONE DETECTED NONE DETECTED   Tetrahydrocannabinol NONE DETECTED NONE DETECTED   Barbiturates NONE DETECTED NONE DETECTED    Comment: (NOTE) DRUG SCREEN FOR MEDICAL PURPOSES ONLY.  IF CONFIRMATION IS NEEDED FOR ANY PURPOSE, NOTIFY LAB WITHIN 5 DAYS.  LOWEST DETECTABLE LIMITS FOR URINE DRUG SCREEN Drug Class                     Cutoff (ng/mL) Amphetamine and metabolites    1000 Barbiturate and metabolites    200 Benzodiazepine                 [id number] Tricyclics and metabolites     300 Opiates and metabolites        300 Cocaine and metabolites        300 THC                            50 Performed at [facility], [place] [address], [place], [place] [address]   POC occult blood, ED  RN will collect     Status: None   Collection Time: 04/13/21  6:05 PM  Result Value Ref Range   Fecal Occult Bld NEGATIVE NEGATIVE  Culture, blood (routine x 2)     Status: None (Preliminary result)   Collection Time: 04/14/21  1:23 AM   Specimen: BLOOD  Result Value Ref Range   Specimen Description      BLOOD RIGHT ANTECUBITAL Performed at [facility],  [place] [address] [place], [place] [address]    Special Requests      BOTTLES DRAWN AEROBIC ONLY Blood Culture adequate volume Performed at [place] [address] [place], [place] [address]    Culture      NO GROWTH < 12 HOURS Performed at [place] [address], [place], [place] [address]    Report Status PENDING   Culture, blood (routine x 2)     Status: None (Preliminary result)   Collection Time: 04/14/21  1:23 AM   Specimen: BLOOD  Result Value Ref Range   Specimen Description      BLOOD RIGHT HAND Performed at [place] [address] [place], [place] [address]    Special Requests      BOTTLES DRAWN AEROBIC ONLY Blood Culture results may not be optimal due to an inadequate volume of blood received in culture bottles Performed at [place] [address] [place], [place] [address]    Culture      NO GROWTH < 12 HOURS Performed at [place] [address], [place], [place] [address]    Report Status PENDING   Magnesium     Status: None   Collection Time: 04/14/21  1:23 AM  Result Value Ref Range   Magnesium 1.9 1.7 - 2.4 mg/dL    Comment: Performed at [facility], [place] [address], [place],  [address]  CK     Status: None   Collection Time: 04/14/21  1:23 AM  Result Value Ref Range   Total CK 48 38 - 234 U/L    Comment: Performed at [facility], [place] [address] [place],  [address]  Procalcitonin     Status: None   Collection Time: 04/14/21  1:23 AM  Result Value Ref Range   Procalcitonin <0.10 ng/mL    Comment:        Interpretation: PCT (Procalcitonin) <= 0.5 ng/mL: Systemic infection (sepsis) is not likely. Local bacterial infection is possible. (NOTE)       Sepsis PCT Algorithm           Lower Respiratory Tract                                      Infection PCT Algorithm    ----------------------------      ----------------------------         PCT < 0.25 ng/mL                PCT < 0.10 ng/mL          Strongly encourage             Strongly discourage   discontinuation of antibiotics    initiation of antibiotics    ----------------------------     -----------------------------       PCT 0.25 - 0.50 ng/mL  PCT 0.10 - 0.25 ng/mL               OR       >80% decrease in PCT            Discourage initiation of                                            antibiotics      Encourage discontinuation           of antibiotics    ----------------------------     -----------------------------         PCT >= 0.50 ng/mL              PCT 0.26 - 0.50 ng/mL               AND        <80% decrease in PCT             Encourage initiation of                                             antibiotics       Encourage continuation           of antibiotics    ----------------------------     -----------------------------        PCT >= 0.50 ng/mL                  PCT > 0.50 ng/mL               AND         increase in PCT                  Strongly encourage                                      initiation of antibiotics    Strongly encourage escalation           of antibiotics                                     -----------------------------                                           PCT <= 0.25 ng/mL                                                 OR                                        > 80% decrease in PCT  Discontinue / Do not initiate                                             antibiotics  Performed at [place] [address] [place], [place] [address]   CBC with Differential/Platelet     Status: Abnormal   Collection Time: 04/14/21  1:23 AM  Result Value Ref Range   WBC 6.9 4.0 - 10.5 K/uL   RBC 3.29 (L) 3.87 - 5.11 MIL/uL   Hemoglobin 8.2 (L) 12.0 - 15.0 g/dL   HCT 28.3 (L) 36.0 - 46.0 %   MCV 86.0 80.0 - 100.0 fL   MCH 24.9 (L)  26.0 - 34.0 pg   MCHC 29.0 (L) 30.0 - 36.0 g/dL   RDW 16.0 (H) 11.5 - 15.5 %   Platelets 408 (H) 150 - 400 K/uL   nRBC 0.0 0.0 - 0.2 %   Neutrophils Relative % 57 %   Neutro Abs 3.9 1.7 - 7.7 K/uL   Lymphocytes Relative 31 %   Lymphs Abs 2.2 0.7 - 4.0 K/uL   Monocytes Relative 7 %   Monocytes Absolute 0.5 0.1 - 1.0 K/uL   Eosinophils Relative 5 %   Eosinophils Absolute 0.4 0.0 - 0.5 K/uL   Basophils Relative 0 %   Basophils Absolute 0.0 0.0 - 0.1 K/uL   Immature Granulocytes 0 %   Abs Immature Granulocytes 0.02 0.00 - 0.07 K/uL    Comment: Performed at [facility], [place] [address] [place], [place] [address]  Vitamin B12     Status: None   Collection Time: 04/14/21  1:23 AM  Result Value Ref Range   Vitamin B-12 823 180 - 914 pg/mL    Comment: (NOTE) This assay is not validated for testing neonatal or myeloproliferative syndrome specimens for Vitamin B12 levels. Performed at [facility], [place] [address], [place], [place] [address]   Folate     Status: None   Collection Time: 04/14/21  1:23 AM  Result Value Ref Range   Folate 29.9 >5.9 ng/mL    Comment: RESULTS CONFIRMED BY MANUAL DILUTION Performed at [place] [address], [place], [place] [address]   Iron and TIBC     Status: Abnormal   Collection Time: 04/14/21  1:23 AM  Result Value Ref Range   Iron 20 (L) 28 - 170 ug/dL   TIBC 387 250 - 450 ug/dL   Saturation Ratios 5 (L) 10.4 - 31.8 %   UIBC 367 ug/dL    Comment: Performed at [facility], [place] [address] [place], [place] [address]  Ferritin     Status: Abnormal   Collection Time: 04/14/21  1:23 AM  Result Value Ref Range   Ferritin 5 (L) 11 - 307 ng/mL    Comment: Performed at [facility], [place] [address] [place], [place] [address]  Reticulocytes     Status: Abnormal   Collection Time: 04/14/21  1:23 AM  Result Value Ref Range   Retic Ct Pct 1.5 0.4 - 3.1 %    RBC. 3.29 (L) 3.87 - 5.11 MIL/uL   Retic Count, Absolute 50.7 19.0 - 186.0 K/uL   Immature Retic Fract 18.9 (H) 2.3 - 15.9 %    Comment: Performed at [facility], [place] [address] [place], [place] [address]  Phosphorus     Status: None   Collection Time: 04/14/21  1:23 AM  Result Value Ref Range   Phosphorus 2.8 2.5 - 4.6 mg/dL    Comment: Performed at [facility], [place] [address], [place], [place] [address]  Comprehensive metabolic panel     Status: Abnormal   Collection Time: 04/14/21  1:23 AM  Result Value Ref Range   Sodium 137 135 - 145 mmol/L   Potassium 3.4 (L) 3.5 - 5.1 mmol/L   Chloride 113 (H) 98 - 111 mmol/L   CO2 19 (L) 22 - 32 mmol/L   Glucose, Bld 84 70 - 99 mg/dL    Comment: Glucose reference range applies only to samples taken after fasting for at least 8 hours.   BUN 7 6 - 20 mg/dL   Creatinine, Ser 0.78 0.44 - 1.00 mg/dL   Calcium 8.1 (L) 8.9 - 10.3 mg/dL   Total Protein 6.5 6.5 - 8.1 g/dL   Albumin 3.3 (L) 3.5 - 5.0 g/dL   AST 17 15 - 41 U/L   ALT 14 0 - 44 U/L   Alkaline Phosphatase 50 38 - 126 U/L   Total Bilirubin 0.3 0.3 - 1.2 mg/dL   GFR, Estimated >60 >60 mL/min    Comment: (NOTE) Calculated using the CKD-EPI Creatinine Equation (2021)    Anion gap 5 5 - 15    Comment: Performed at [facility], [place] [address] [place], [place] [address]  T4, free     Status: None   Collection Time: 04/14/21  1:23 AM  Result Value Ref Range   Free T4 1.03 0.61 - 1.12 ng/dL    Comment: (NOTE) Biotin ingestion may interfere with free T4 tests. If the results are inconsistent with the TSH level, previous test results, or the clinical presentation, then consider biotin interference. If needed, order repeat testing after stopping biotin. Performed at [facility], [place] [address], [place],  [address]     CT ABDOMEN PELVIS W CONTRAST  Result Date: 04/13/2021 CLINICAL DATA:  Epigastric pain with  nausea, vomiting and diarrhea for 1 week. EXAM: CT ABDOMEN AND PELVIS WITH CONTRAST TECHNIQUE: Multidetector CT imaging of the abdomen and pelvis was performed using the standard protocol following bolus administration of intravenous contrast. CONTRAST:  57m OMNIPAQUE IOHEXOL 350 MG/ML SOLN COMPARISON:  None. FINDINGS: Lower chest: The lung bases are grossly clear. No pulmonary lesions or pleural effusions. The heart is within normal limits in size. No pericardial effusion. There is a small to moderate-sized hiatal hernia noted. Hepatobiliary: No hepatic lesions or intrahepatic biliary dilatation. Gallbladder is unremarkable. No common bile duct dilatation. Pancreas: No mass, inflammation or ductal dilatation. Spleen: Normal size.  No focal lesions. Adrenals/Urinary Tract: The adrenal glands are normal. Small bilateral renal calculi are noted but no obstructing ureteral calculi or bladder calculi. No worrisome renal or bladder lesions. Stomach/Bowel: The stomach, duodenum and small bowel are unremarkable. No acute inflammatory process, mass lesions or obstructive findings. The colon is distended and fluid-filled with air-fluid levels. These findings are typically seen with diarrhea. I do not see any wall thickening or pericolonic inflammatory changes. The rectum and sigmoid colon are relatively decompressed. Vascular/Lymphatic: The aorta is normal in caliber. No dissection. The branch vessels are patent. The major venous structures are patent. No mesenteric or retroperitoneal mass or adenopathy. Small scattered lymph nodes are noted. Home Reproductive: The uterus and ovaries are unremarkable. Other: No pelvic mass or adenopathy. No free pelvic  fluid collections. No inguinal mass or adenopathy. No abdominal wall hernia or subcutaneous lesions. Musculoskeletal: No significant bony findings. IMPRESSION: 1. Distended and fluid-filled colon with air-fluid levels typically seen with diarrhea. No colonic wall thickening  or pericolonic inflammatory changes. The stomach and small bowel unremarkable. 2. Small bilateral renal calculi but no obstructing ureteral calculi or bladder calculi. 3. Small to moderate-sized hiatal hernia. Electronically Signed   By: Marijo Sanes M.D.   On: 04/13/2021 16:35    Review of Systems increased weakness and fatigue not mentioned above Blood pressure 106/66, pulse 81, temperature 98.2 F (36.8 C), temperature source Oral, resp. rate 15, height '5\' 5"'$  (1.651 m), weight 64.5 kg, SpO2 97 %. Physical Exam vital signs stable afebrile no acute distress abdomen is minimal discomfort soft occasional bowel sounds labs and CT reviewed  Assessment/Plan: Iron deficiency nausea vomiting and diarrhea periodically Plan: She seems stable enough to go home and based on availability in the hospital endoscopy unit she would be better served by being discharged either later today or tomorrow morning and going for her endoscopy and colonoscopy in our outpatient facility tomorrow at 3 PM and we discussed n.p.o. after 11 AM and clear liquid diet only and to take her prep 1 glass every hour and if she goes home tomorrow she can asked the nurse to flush her IV and that way it does not need to be restarted and in the future she will need iron and both her and her husband are comfortable with that plan and we answered all of their questions and sometimes thyroid abnormalities can cause some significant GI issues as well  Arayna Illescas E 04/14/2021, 12:34 PM

## 2021-04-14 NOTE — Progress Notes (Signed)
PT Cancellation Note  Patient Details Name: YAMANI CHEA MRN: [id number] DOB: 1971/01/31   Cancelled Treatment:    Reason Eval/Treat Not Completed: Medical issues which prohibited therapy, as per OT recent note, will check back another time.   Claretha Cooper 04/14/2021, 9:50 AM [place] Pager [phone] Office [phone]

## 2021-04-14 NOTE — Progress Notes (Signed)
OT Cancellation Note  Patient Details Name: Emily Park MRN: [id number] DOB: 1971/05/02   Cancelled Treatment:    Reason Eval/Treat Not Completed: Patient declined, no reason specified patient reported that she does not need therapy at this time.patients BP was 105/62 mmhg in supine with pulse rate of 88 bpm  Patient reported that "they" needed to get her symptoms and eating under control first. Patient stated she will move around good once symptoms are managed. Will check back today if schedule allows. Jackelyn Poling OTR/L, [place] [facility] Office# [phone] Pager# [phone]   [place] 04/14/2021, 9:14 AM

## 2021-04-15 LAB — T3: T3, Total: 90 ng/dL (ref 71–180)

## 2021-04-16 LAB — URINE CULTURE: Culture: >=100000 — AB

## 2021-04-19 LAB — CULTURE, BLOOD (ROUTINE X 2)
Culture: NO GROWTH
Culture: NO GROWTH
Special Requests: ADEQUATE

## 2021-05-06 ENCOUNTER — Other Ambulatory Visit (HOSPITAL_COMMUNITY): Payer: Self-pay | Admitting: Physician Assistant

## 2021-05-06 ENCOUNTER — Ambulatory Visit: Payer: 59 | Admitting: Internal Medicine

## 2021-05-06 ENCOUNTER — Other Ambulatory Visit: Payer: Self-pay | Admitting: Physician Assistant

## 2021-05-06 DIAGNOSIS — R112 Nausea with vomiting, unspecified: Secondary | ICD-10-CM

## 2021-05-10 ENCOUNTER — Telehealth: Payer: Self-pay | Admitting: Physician Assistant

## 2021-05-10 NOTE — Telephone Encounter (Signed)
Scheduled appt per 9/12 referral. Pt is aware of appt date and time.

## 2021-05-13 ENCOUNTER — Inpatient Hospital Stay: Payer: Commercial Managed Care - PPO

## 2021-05-13 ENCOUNTER — Telehealth: Payer: Self-pay | Admitting: Physician Assistant

## 2021-05-13 ENCOUNTER — Telehealth: Payer: Self-pay | Admitting: Medical Oncology

## 2021-05-13 ENCOUNTER — Inpatient Hospital Stay: Payer: Commercial Managed Care - PPO | Attending: Physician Assistant | Admitting: Physician Assistant

## 2021-05-13 ENCOUNTER — Telehealth: Payer: Self-pay

## 2021-05-13 ENCOUNTER — Encounter: Payer: Self-pay | Admitting: Physician Assistant

## 2021-05-13 ENCOUNTER — Encounter (HOSPITAL_COMMUNITY): Payer: Self-pay | Admitting: Emergency Medicine

## 2021-05-13 ENCOUNTER — Inpatient Hospital Stay (HOSPITAL_COMMUNITY)
Admission: EM | Admit: 2021-05-13 | Discharge: 2021-05-17 | DRG: 641 | Disposition: A | Discharge status: Home / Self Care | Payer: Commercial Managed Care - PPO | Attending: Internal Medicine | Admitting: Internal Medicine

## 2021-05-13 ENCOUNTER — Other Ambulatory Visit: Payer: Self-pay

## 2021-05-13 VITALS — BP 128/92 | HR 108 | Temp 99.1°F | Resp 17 | Ht 65.0 in | Wt 130.0 lb

## 2021-05-13 DIAGNOSIS — F909 Attention-deficit hyperactivity disorder, unspecified type: Secondary | ICD-10-CM | POA: Diagnosis present

## 2021-05-13 DIAGNOSIS — K219 Gastro-esophageal reflux disease without esophagitis: Secondary | ICD-10-CM | POA: Diagnosis present

## 2021-05-13 DIAGNOSIS — E039 Hypothyroidism, unspecified: Secondary | ICD-10-CM | POA: Diagnosis present

## 2021-05-13 DIAGNOSIS — K449 Diaphragmatic hernia without obstruction or gangrene: Secondary | ICD-10-CM | POA: Diagnosis present

## 2021-05-13 DIAGNOSIS — Z20822 Contact with and (suspected) exposure to covid-19: Secondary | ICD-10-CM | POA: Diagnosis present

## 2021-05-13 DIAGNOSIS — Z880 Allergy status to penicillin: Secondary | ICD-10-CM

## 2021-05-13 DIAGNOSIS — Z818 Family history of other mental and behavioral disorders: Secondary | ICD-10-CM

## 2021-05-13 DIAGNOSIS — D509 Iron deficiency anemia, unspecified: Secondary | ICD-10-CM | POA: Diagnosis present

## 2021-05-13 DIAGNOSIS — Z8719 Personal history of other diseases of the digestive system: Secondary | ICD-10-CM | POA: Diagnosis not present

## 2021-05-13 DIAGNOSIS — R0602 Shortness of breath: Secondary | ICD-10-CM | POA: Insufficient documentation

## 2021-05-13 DIAGNOSIS — R040 Epistaxis: Secondary | ICD-10-CM | POA: Insufficient documentation

## 2021-05-13 DIAGNOSIS — R319 Hematuria, unspecified: Secondary | ICD-10-CM | POA: Insufficient documentation

## 2021-05-13 DIAGNOSIS — K5909 Other constipation: Secondary | ICD-10-CM | POA: Diagnosis present

## 2021-05-13 DIAGNOSIS — K640 First degree hemorrhoids: Secondary | ICD-10-CM | POA: Diagnosis present

## 2021-05-13 DIAGNOSIS — G43909 Migraine, unspecified, not intractable, without status migrainosus: Secondary | ICD-10-CM | POA: Diagnosis present

## 2021-05-13 DIAGNOSIS — R042 Hemoptysis: Secondary | ICD-10-CM | POA: Insufficient documentation

## 2021-05-13 DIAGNOSIS — E0520 Thyrotoxicosis with toxic multinodular goiter without thyrotoxic crisis or storm: Secondary | ICD-10-CM | POA: Diagnosis not present

## 2021-05-13 DIAGNOSIS — Z8249 Family history of ischemic heart disease and other diseases of the circulatory system: Secondary | ICD-10-CM | POA: Insufficient documentation

## 2021-05-13 DIAGNOSIS — E876 Hypokalemia: Secondary | ICD-10-CM

## 2021-05-13 DIAGNOSIS — E785 Hyperlipidemia, unspecified: Secondary | ICD-10-CM | POA: Insufficient documentation

## 2021-05-13 DIAGNOSIS — R011 Cardiac murmur, unspecified: Secondary | ICD-10-CM | POA: Diagnosis present

## 2021-05-13 DIAGNOSIS — Z79899 Other long term (current) drug therapy: Secondary | ICD-10-CM | POA: Insufficient documentation

## 2021-05-13 DIAGNOSIS — Z881 Allergy status to other antibiotic agents status: Secondary | ICD-10-CM

## 2021-05-13 DIAGNOSIS — Z8711 Personal history of peptic ulcer disease: Secondary | ICD-10-CM

## 2021-05-13 DIAGNOSIS — Z833 Family history of diabetes mellitus: Secondary | ICD-10-CM | POA: Insufficient documentation

## 2021-05-13 DIAGNOSIS — I471 Supraventricular tachycardia, unspecified: Secondary | ICD-10-CM | POA: Diagnosis present

## 2021-05-13 DIAGNOSIS — R112 Nausea with vomiting, unspecified: Secondary | ICD-10-CM | POA: Diagnosis present

## 2021-05-13 DIAGNOSIS — Z635 Disruption of family by separation and divorce: Secondary | ICD-10-CM | POA: Diagnosis not present

## 2021-05-13 DIAGNOSIS — F419 Anxiety disorder, unspecified: Secondary | ICD-10-CM | POA: Diagnosis present

## 2021-05-13 DIAGNOSIS — Z8349 Family history of other endocrine, nutritional and metabolic diseases: Secondary | ICD-10-CM | POA: Diagnosis not present

## 2021-05-13 DIAGNOSIS — E041 Nontoxic single thyroid nodule: Secondary | ICD-10-CM | POA: Diagnosis present

## 2021-05-13 DIAGNOSIS — Z803 Family history of malignant neoplasm of breast: Secondary | ICD-10-CM | POA: Insufficient documentation

## 2021-05-13 DIAGNOSIS — F988 Other specified behavioral and emotional disorders with onset usually occurring in childhood and adolescence: Secondary | ICD-10-CM | POA: Insufficient documentation

## 2021-05-13 DIAGNOSIS — E2740 Unspecified adrenocortical insufficiency: Secondary | ICD-10-CM | POA: Diagnosis present

## 2021-05-13 DIAGNOSIS — D508 Other iron deficiency anemias: Secondary | ICD-10-CM

## 2021-05-13 DIAGNOSIS — Z808 Family history of malignant neoplasm of other organs or systems: Secondary | ICD-10-CM | POA: Insufficient documentation

## 2021-05-13 DIAGNOSIS — F32A Depression, unspecified: Secondary | ICD-10-CM | POA: Diagnosis present

## 2021-05-13 DIAGNOSIS — E079 Disorder of thyroid, unspecified: Secondary | ICD-10-CM | POA: Insufficient documentation

## 2021-05-13 DIAGNOSIS — N179 Acute kidney failure, unspecified: Secondary | ICD-10-CM | POA: Diagnosis present

## 2021-05-13 DIAGNOSIS — Z87442 Personal history of urinary calculi: Secondary | ICD-10-CM

## 2021-05-13 DIAGNOSIS — K2211 Ulcer of esophagus with bleeding: Secondary | ICD-10-CM | POA: Insufficient documentation

## 2021-05-13 DIAGNOSIS — K068 Other specified disorders of gingiva and edentulous alveolar ridge: Secondary | ICD-10-CM | POA: Insufficient documentation

## 2021-05-13 DIAGNOSIS — K5900 Constipation, unspecified: Secondary | ICD-10-CM | POA: Insufficient documentation

## 2021-05-13 DIAGNOSIS — I959 Hypotension, unspecified: Secondary | ICD-10-CM | POA: Diagnosis present

## 2021-05-13 DIAGNOSIS — R5383 Other fatigue: Secondary | ICD-10-CM | POA: Insufficient documentation

## 2021-05-13 LAB — RETIC PANEL
Immature Retic Fract: 12.5 % (ref 2.3–15.9)
RBC.: 4.09 MIL/uL (ref 3.87–5.11)
Retic Count, Absolute: 63 10*3/uL (ref 19.0–186.0)
Retic Ct Pct: 1.5 % (ref 0.4–3.1)
Reticulocyte Hemoglobin: 24.3 pg — ABNORMAL LOW (ref >27.9)

## 2021-05-13 LAB — CBC WITH DIFFERENTIAL/PLATELET
Abs Immature Granulocytes: 0.04 10*3/uL (ref 0.00–0.07)
Basophils Absolute: 0.1 10*3/uL (ref 0.0–0.1)
Basophils Relative: 1 %
Eosinophils Absolute: 0 10*3/uL (ref 0.0–0.5)
Eosinophils Relative: 0 %
HCT: 33 % — ABNORMAL LOW (ref 36.0–46.0)
Hemoglobin: 9.9 g/dL — ABNORMAL LOW (ref 12.0–15.0)
Immature Granulocytes: 0 %
Lymphocytes Relative: 22 %
Lymphs Abs: 2 10*3/uL (ref 0.7–4.0)
MCH: 23.3 pg — ABNORMAL LOW (ref 26.0–34.0)
MCHC: 30 g/dL (ref 30.0–36.0)
MCV: 77.8 fL — ABNORMAL LOW (ref 80.0–100.0)
Monocytes Absolute: 0.6 10*3/uL (ref 0.1–1.0)
Monocytes Relative: 7 %
Neutro Abs: 6.6 10*3/uL (ref 1.7–7.7)
Neutrophils Relative %: 70 %
Platelets: 516 10*3/uL — ABNORMAL HIGH (ref 150–400)
RBC: 4.24 MIL/uL (ref 3.87–5.11)
RDW: 16.9 % — ABNORMAL HIGH (ref 11.5–15.5)
WBC: 9.3 10*3/uL (ref 4.0–10.5)
nRBC: 0 % (ref 0.0–0.2)

## 2021-05-13 LAB — CBC WITH DIFFERENTIAL (CANCER CENTER ONLY)
Abs Immature Granulocytes: 0.03 10*3/uL (ref 0.00–0.07)
Basophils Absolute: 0 10*3/uL (ref 0.0–0.1)
Basophils Relative: 0 %
Eosinophils Absolute: 0.2 10*3/uL (ref 0.0–0.5)
Eosinophils Relative: 2 %
HCT: 31.3 % — ABNORMAL LOW (ref 36.0–46.0)
Hemoglobin: 9.8 g/dL — ABNORMAL LOW (ref 12.0–15.0)
Immature Granulocytes: 0 %
Lymphocytes Relative: 17 %
Lymphs Abs: 1.7 10*3/uL (ref 0.7–4.0)
MCH: 23.8 pg — ABNORMAL LOW (ref 26.0–34.0)
MCHC: 31.3 g/dL (ref 30.0–36.0)
MCV: 76.2 fL — ABNORMAL LOW (ref 80.0–100.0)
Monocytes Absolute: 0.5 10*3/uL (ref 0.1–1.0)
Monocytes Relative: 5 %
Neutro Abs: 7.6 10*3/uL (ref 1.7–7.7)
Neutrophils Relative %: 76 %
Platelet Count: 520 10*3/uL — ABNORMAL HIGH (ref 150–400)
RBC: 4.11 MIL/uL (ref 3.87–5.11)
RDW: 16.6 % — ABNORMAL HIGH (ref 11.5–15.5)
WBC Count: 10.1 10*3/uL (ref 4.0–10.5)
nRBC: 0 % (ref 0.0–0.2)

## 2021-05-13 LAB — IRON AND TIBC
Iron: 400 ug/dL — ABNORMAL HIGH (ref 41–142)
Saturation Ratios: 104 % — ABNORMAL HIGH (ref 21–57)
TIBC: 383 ug/dL (ref 236–444)
UIBC: UNDETERMINED ug/dL (ref 120–384)

## 2021-05-13 LAB — CMP (CANCER CENTER ONLY)
ALT: 11 U/L (ref 0–44)
AST: 19 U/L (ref 15–41)
Albumin: 4.3 g/dL (ref 3.5–5.0)
Alkaline Phosphatase: 93 U/L (ref 38–126)
Anion gap: 14 (ref 5–15)
BUN: 13 mg/dL (ref 6–20)
CO2: 25 mmol/L (ref 22–32)
Calcium: 9.8 mg/dL (ref 8.9–10.3)
Chloride: 97 mmol/L — ABNORMAL LOW (ref 98–111)
Creatinine: 1.49 mg/dL — ABNORMAL HIGH (ref 0.44–1.00)
GFR, Estimated: 42 mL/min — ABNORMAL LOW (ref >60)
Glucose, Bld: 112 mg/dL — ABNORMAL HIGH (ref 70–99)
Potassium: 2.5 mmol/L — CL (ref 3.5–5.1)
Sodium: 136 mmol/L (ref 135–145)
Total Bilirubin: 0.3 mg/dL (ref 0.3–1.2)
Total Protein: 8.5 g/dL — ABNORMAL HIGH (ref 6.5–8.1)

## 2021-05-13 LAB — BASIC METABOLIC PANEL
Anion gap: 14 (ref 5–15)
BUN: 15 mg/dL (ref 6–20)
CO2: 27 mmol/L (ref 22–32)
Calcium: 9.3 mg/dL (ref 8.9–10.3)
Chloride: 93 mmol/L — ABNORMAL LOW (ref 98–111)
Creatinine, Ser: 1.42 mg/dL — ABNORMAL HIGH (ref 0.44–1.00)
GFR, Estimated: 45 mL/min — ABNORMAL LOW (ref >60)
Glucose, Bld: 98 mg/dL (ref 70–99)
Potassium: 2.3 mmol/L — CL (ref 3.5–5.1)
Sodium: 134 mmol/L — ABNORMAL LOW (ref 135–145)

## 2021-05-13 LAB — FERRITIN: Ferritin: 10 ng/mL — ABNORMAL LOW (ref 11–307)

## 2021-05-13 LAB — MAGNESIUM: Magnesium: 2.3 mg/dL (ref 1.7–2.4)

## 2021-05-13 MED ORDER — MORPHINE SULFATE (PF) 2 MG/ML IV SOLN: 2.0000 mg | INTRAVENOUS | Status: DC | PRN

## 2021-05-13 MED ORDER — POLYETHYLENE GLYCOL 3350 17 G PO PACK: 17.0000 g | PACK | Freq: Every day | ORAL | Status: DC | PRN

## 2021-05-13 MED ORDER — VITAMIN B-12 1000 MCG PO TABS
1000.0000 ug | ORAL_TABLET | Freq: Every day | ORAL | Status: DC
  Administered 2021-05-14 – 2021-05-15 (×2): 1000 ug via ORAL
  Filled 2021-05-13 (×4): qty 1

## 2021-05-13 MED ORDER — ONDANSETRON 4 MG PO TBDP: 8.0000 mg | ORAL_TABLET | Freq: Three times a day (TID) | ORAL | Status: DC | PRN

## 2021-05-13 MED ORDER — HYDROCODONE-ACETAMINOPHEN 5-325 MG PO TABS: 1.0000 | ORAL_TABLET | ORAL | Status: DC | PRN

## 2021-05-13 MED ORDER — POTASSIUM CHLORIDE 10 MEQ/100ML IV SOLN
10.0000 meq | INTRAVENOUS | Status: DC
Start: 2021-05-13 — End: 2021-05-13

## 2021-05-13 MED ORDER — ACETAMINOPHEN 325 MG PO TABS
650.0000 mg | ORAL_TABLET | Freq: Four times a day (QID) | ORAL | Status: DC | PRN
  Administered 2021-05-16: 650 mg via ORAL
  Filled 2021-05-13: qty 2

## 2021-05-13 MED ORDER — POTASSIUM CHLORIDE IN NACL 40-0.9 MEQ/L-% IV SOLN
INTRAVENOUS | Status: DC
  Filled 2021-05-13 (×2): qty 1000

## 2021-05-13 MED ORDER — CLONAZEPAM 1 MG PO TABS
1.0000 mg | ORAL_TABLET | Freq: Every day | ORAL | Status: DC
  Administered 2021-05-13 – 2021-05-15 (×3): 1 mg via ORAL
  Filled 2021-05-13 (×2): qty 1
  Filled 2021-05-13: qty 2

## 2021-05-13 MED ORDER — PANTOPRAZOLE SODIUM 40 MG PO TBEC
40.0000 mg | DELAYED_RELEASE_TABLET | Freq: Two times a day (BID) | ORAL | Status: DC
  Administered 2021-05-13 – 2021-05-16 (×6): 40 mg via ORAL
  Filled 2021-05-13 (×7): qty 1

## 2021-05-13 MED ORDER — ACETAMINOPHEN 650 MG RE SUPP: 650.0000 mg | Freq: Four times a day (QID) | RECTAL | Status: DC | PRN

## 2021-05-13 MED ORDER — TOPIRAMATE 25 MG PO TABS
50.0000 mg | ORAL_TABLET | Freq: Three times a day (TID) | ORAL | Status: DC
  Administered 2021-05-13 – 2021-05-14 (×4): 50 mg via ORAL
  Filled 2021-05-13 (×4): qty 2

## 2021-05-13 MED ORDER — THIAMINE HCL 100 MG/ML IJ SOLN
Freq: Once | INTRAVENOUS | Status: AC
  Filled 2021-05-13: qty 1000

## 2021-05-13 MED ORDER — POTASSIUM CHLORIDE 10 MEQ/100ML IV SOLN
10.0000 meq | INTRAVENOUS | Status: AC
  Administered 2021-05-13 – 2021-05-14 (×4): 10 meq via INTRAVENOUS
  Filled 2021-05-13 (×4): qty 100

## 2021-05-13 MED ORDER — HYOSCYAMINE SULFATE 0.125 MG SL SUBL
0.1250 mg | SUBLINGUAL_TABLET | Freq: Every day | SUBLINGUAL | Status: DC | PRN
  Filled 2021-05-13: qty 1

## 2021-05-13 MED ORDER — SUCRALFATE 1 G PO TABS
1.0000 g | ORAL_TABLET | Freq: Two times a day (BID) | ORAL | Status: DC
  Administered 2021-05-13 – 2021-05-16 (×6): 1 g via ORAL
  Filled 2021-05-13 (×7): qty 1

## 2021-05-13 MED ORDER — METOCLOPRAMIDE HCL 5 MG PO TABS: 5.0000 mg | ORAL_TABLET | Freq: Two times a day (BID) | ORAL | Status: DC | PRN

## 2021-05-13 MED ORDER — POTASSIUM CHLORIDE CRYS ER 20 MEQ PO TBCR
40.0000 meq | EXTENDED_RELEASE_TABLET | Freq: Two times a day (BID) | ORAL | Status: DC
  Administered 2021-05-13: 40 meq via ORAL
  Filled 2021-05-13: qty 2

## 2021-05-13 MED ORDER — PROMETHAZINE HCL 25 MG PO TABS: 25.0000 mg | ORAL_TABLET | Freq: Four times a day (QID) | ORAL | Status: DC | PRN

## 2021-05-13 NOTE — Telephone Encounter (Signed)
CRITICAL VALUE STICKER  CRITICAL VALUE: K+=2.5  RECEIVER (on-site recipient of call):Deitra Craine  DATE & TIME NOTIFIED: 05/13/21@ 1515  MESSENGER (representative from lab): Pam MD NOTIFIED: Dede Query, PA-C  TIME OF NOTIFICATION: [id number] 05/13/21  RESPONSE:  Provider is seeing pt today .

## 2021-05-13 NOTE — Telephone Encounter (Signed)
Per Dede Query, PA I have contacted [facility] twice this afternoon requesting a copy of the patients most recent EGD report. I provided our fax number both times but never received the fax. I have attempted to call back a third time to request the report but was unable to get through to an operator. Dede Query has been made aware and we will continue to try and follow up.

## 2021-05-13 NOTE — ED Provider Notes (Signed)
[facility] DEPT Provider Note   CSN: [id number] Arrival date & time: 05/13/21  1645     History Chief Complaint  Patient presents with   Abnormal Lab    Emily Park is a 50 y.o. female.  HPI  50 year old female with past medical history of SVT, HLD, depression, iron deficiency, chronic nausea/vomiting, chronic hypokalemia who is on a liquid diet per GI presents to the emergency department with concern for hypokalemia and outpatient labs.  Patient states that she has chronic nausea/vomiting with episodes of emesis about 3 times a week.  This has been unchanged.  She takes oral potassium as an outpatient as well as magnesium supplements and other medications.  States that she is been compliant.  Outpatient blood work showed a possible hypokalemia of 2.5, she was referred here for further evaluation.  Endorses fatigue and mild nausea but denies any other acute symptoms.  Past Medical History:  Diagnosis Date   Abnormal TSH    ADD (attention deficit disorder)    Anemia    Anxiety    Arrhythmia    Decreased libido    Depression    Heart murmur    Hyperlipidemia    Hypokalemia    Migraine    Nephrolithiasis    Pap smear abnormality of cervix/human papillomavirus (HPV) positive    Sleep difficulties    SVT (supraventricular tachycardia) (HCC)     Patient Active Problem List   Diagnosis Date Noted   IDA (iron deficiency anemia) 05/13/2021   Diarrhea 04/14/2021   Intractable vomiting with nausea 04/14/2021   Lower urinary tract infectious disease 04/14/2021   Sepsis ([place]) 04/13/2021   Thyroid nodule 10/26/2020   AKI (acute kidney injury) ([place])    Hypotension    Hypovolemic shock ([place]) 08/29/2020   Migraine 02/27/2019   Lung nodules 11/14/2015   Paroxysmal SVT (supraventricular tachycardia) ([place]) 11/13/2015   Attention deficit hyperactivity disorder (ADHD) 11/13/2015    Past Surgical History:  Procedure Laterality Date   ABLATION      Cardiac ablation, endometrial ablation   BREAST LUMPECTOMY     right breast   BREAST LUMPECTOMY Right    CARDIOVERSION     COLPOSCOPY     w/ cervical biopsy   DILITATION & CURRETTAGE/HYSTROSCOPY WITH NOVASURE ABLATION N/A 09/11/2014   Procedure: DILATATION & CURETTAGE/HYSTEROSCOPY WITH NOVASURE ABLATION;  Surgeon: Lovenia Kim, MD;  Location: [facility];  Service: Gynecology;  Laterality: N/A;     OB History     Gravida      Para      Term      Preterm      AB      Living  2      SAB      IAB      Ectopic      Multiple      Live Births              Family History  Problem Relation Age of Onset   Hypothyroidism Mother    Depression Mother    Cancer Mother        thyroid   Thyroid disease Mother    Hypertension Father    Heart attack Father        Age 46   AAA (abdominal aortic aneurysm) Father    Hypothyroidism Sister    Hyperthyroidism Sister    Diabetes Maternal Grandfather    Breast cancer Paternal Grandmother     Social History  Tobacco Use   Smoking status: Never    Passive exposure: Current   Smokeless tobacco: Never  Vaping Use   Vaping Use: Never used  Substance Use Topics   Alcohol use: Not Currently    Alcohol/week: 2.0 standard drinks    Types: 2 Glasses of wine per week    Comment: occasional wine   Drug use: Not Currently    Home Medications Prior to Admission medications   Medication Sig Start Date End Date Taking? Authorizing Provider  amphetamine-dextroamphetamine (ADDERALL XR) 30 MG 24 hr capsule Take 30 mg by mouth daily as needed (ADHD).    [provider]  Ascorbic Acid (VITAMIN C PO) Take 1 tablet by mouth daily.    [provider]  BIOTIN PO Take 1 tablet by mouth daily.    [provider]  clonazePAM (KLONOPIN) 1 MG tablet Take 1 mg by mouth at bedtime. 01/20/21   [provider]  cloNIDine (CATAPRES) 0.1 MG tablet Take 0.1 mg by mouth 3 (three) times daily. Patient is taking  once per day 05/10/21   [provider]  doxepin (SINEQUAN) 75 MG capsule Take 75 mg by mouth at bedtime. 12/22/20   [provider]  FLUoxetine (PROZAC) 20 MG capsule Take 60 mg by mouth every morning. 04/01/21   [provider]  hyoscyamine (LEVSIN SL) 0.125 MG SL tablet Take 1 tablet by mouth as needed. 04/29/21   [provider]  MAGNESIUM PO Take 1 tablet by mouth daily.    [provider]  metoCLOPramide (REGLAN) 5 MG tablet Take 5 mg by mouth 2 (two) times daily. 04/28/21   [provider]  Multiple Vitamin (MULTIVITAMIN PO) Take 1 tablet by mouth daily.    [provider]  ondansetron (ZOFRAN ODT) 4 MG disintegrating tablet Take 1 tablet (4 mg total) by mouth every 8 (eight) hours as needed. 02/27/19   Marcial Pacas, MD  pantoprazole (PROTONIX) 40 MG tablet Take 40 mg by mouth 2 (two) times daily. 04/08/21   [provider]  potassium chloride SA (KLOR-CON) 20 MEQ tablet Take 2 tablets (40 mEq total) by mouth daily. Patient taking differently: Take 40 mEq by mouth 2 (two) times daily. 02/15/21   Charlesetta Shanks, MD  promethazine (PHENERGAN) 25 MG tablet Take 1 tablet (25 mg total) by mouth every 6 (six) hours as needed for nausea or vomiting. 02/15/21   Charlesetta Shanks, MD  sucralfate (CARAFATE) 1 g tablet Take 1 g by mouth 2 (two) times daily. 04/28/21   [provider]  topiramate (TOPAMAX) 50 MG tablet Take 50 mg by mouth 3 (three) times daily. 01/11/21   [provider]  vitamin B-12 (CYANOCOBALAMIN) 1000 MCG tablet Take 1,000 mcg by mouth daily.    [provider]  VITAMIN D PO Take 2,000 Units by mouth daily.    [provider]  zinc gluconate 50 MG tablet Take 50 mg by mouth daily.    [provider]    Allergies    Amoxicillin, Bactrim [sulfamethoxazole-trimethoprim], and Zithromax [azithromycin]  Review of Systems   Review of Systems  Constitutional:  Positive for appetite change  and fatigue. Negative for chills and fever.  HENT:  Negative for congestion.   Eyes:  Negative for visual disturbance.  Respiratory:  Negative for shortness of breath.   Cardiovascular:  Negative for chest pain.  Gastrointestinal:  Positive for abdominal pain, diarrhea, nausea and vomiting.  Genitourinary:  Negative for dysuria.  Skin:  Negative for  rash.  Neurological:  Negative for headaches.   Physical Exam Updated Vital Signs BP 108/81   Pulse 76   Temp 98.3 F (36.8 C) (Oral)   Resp 18   Ht '5\' 5"'$  (1.651 m)   Wt 59 kg   SpO2 98%   BMI 21.65 kg/m   Physical Exam Vitals and nursing note reviewed.  Constitutional:      Appearance: Normal appearance.  HENT:     Head: Normocephalic.     Mouth/Throat:     Mouth: Mucous membranes are moist.  Cardiovascular:     Rate and Rhythm: Normal rate.  Pulmonary:     Effort: Pulmonary effort is normal. No respiratory distress.  Abdominal:     Palpations: Abdomen is soft.     Tenderness: There is no abdominal tenderness. There is no guarding.  Skin:    General: Skin is warm.  Neurological:     Mental Status: She is alert and oriented to person, place, and time. Mental status is at baseline.  Psychiatric:        Mood and Affect: Mood normal.    ED Results / Procedures / Treatments   Labs (all labs ordered are listed, but only abnormal results are displayed) Labs Reviewed  CBC WITH DIFFERENTIAL/PLATELET - Abnormal; Notable for the following components:      Result Value   Hemoglobin 9.9 (*)    HCT 33.0 (*)    MCV 77.8 (*)    MCH 23.3 (*)    RDW 16.9 (*)    Platelets 516 (*)    All other components within normal limits  BASIC METABOLIC PANEL - Abnormal; Notable for the following components:   Sodium 134 (*)    Potassium 2.3 (*)    Chloride 93 (*)    Creatinine, Ser 1.42 (*)    GFR, Estimated 45 (*)    All other components within normal limits  MAGNESIUM    EKG None  Radiology No results  found.  Procedures .Critical Care Performed by: Lorelle Gibbs, DO Authorized by: Lorelle Gibbs, DO   Critical care provider statement:    Critical care time (minutes):  45   Critical care time was exclusive of:  Separately billable procedures and treating other patients   Critical care was necessary to treat or prevent imminent or life-threatening deterioration of the following conditions:  Endocrine crisis   Critical care was time spent personally by me on the following activities:  Discussions with consultants, evaluation of patient's response to treatment, examination of patient, ordering and performing treatments and interventions, ordering and review of laboratory studies, ordering and review of radiographic studies, pulse oximetry, re-evaluation of patient's condition, obtaining history from patient or surrogate and review of old charts   Medications Ordered in ED Medications  potassium chloride 10 mEq in 100 mL IVPB (has no administration in time range)    ED Course  I have reviewed the triage vital signs and the nursing notes.  Pertinent labs & imaging results that were available during my care of the patient were reviewed by me and considered in my medical decision making (see chart for details).    MDM Rules/Calculators/A&P                           50 year old female presents the emergency department with concern for hypokalemia.  Patient is dealing with chronic nausea/vomiting/diarrhea and iron deficiency anemia with hypokalemia.  She is on oral supplements.  Currently on a liquid diet per GI, recently had an endoscopy of which I cannot see the results.  Repeat blood work today shows a hypokalemia of 2.3, normal magnesium.  Plan for IV replacement and admission. EKG appears stable. No active vomiting Patients evaluation and results requires admission for further treatment and care. Patient agrees with admission plan, offers no new complaints and is stable/unchanged at  time of admit.  Final Clinical Impression(s) / ED Diagnoses Final diagnoses:  None    Rx / DC Orders ED Discharge Orders     None        Lorelle Gibbs, DO 05/13/21 2012

## 2021-05-13 NOTE — Telephone Encounter (Signed)
Received critical lab result. Potassium is 2.5. Advised patient to go to [facility] ED. I called ED charge nurse and gave report.

## 2021-05-13 NOTE — H&P (Signed)
History and Physical    Emily Park [id number] DOB: 17-Jun-1971 DOA: 05/13/2021  PCP: [name], MD  Patient coming from: Home  I have personally briefly reviewed patient's old medical records in [place]  Chief Complaint: Abnormal labs   HPI: Emily Park is a 50 y.o. female with medical history significant of hiatal hernia, ulcers, SVT, HLD, thyroid nodule with depressed TSH who has had recent ongoing chronic nausea and vomiting.  She is seeing [facility] and they have diagnosed her with possible gastroparesis.  She has been on a liquid diet which has improved her nausea and vomiting significantly.  Additionally she reports being on potassium 20 mEq, 2 tablets twice a day and taking this as directed.  She went for labs at the cancer center today where she has been seen for low iron and anemia and was noted to have a potassium level of 2.5.  She was directed to the ED for further evaluation and treatment. (ED Course: In the ED patient was noted to have a potassium of 2.3 and some acute renal insufficiency with a creatinine at 1.42.  She was tolerating p.o. and was started on some IV repletion.  Review of Systems: As per HPI otherwise 10 point review of systems negative.   Past Medical History:  Diagnosis Date   Abnormal TSH    ADD (attention deficit disorder)    Anemia    Anxiety    Arrhythmia    Decreased libido    Depression    Heart murmur    Hyperlipidemia    Hypokalemia    Migraine    Nephrolithiasis    Pap smear abnormality of cervix/human papillomavirus (HPV) positive    Sleep difficulties    SVT (supraventricular tachycardia) (HCC)     Past Surgical History:  Procedure Laterality Date   ABLATION     Cardiac ablation, endometrial ablation   BREAST LUMPECTOMY     right breast   BREAST LUMPECTOMY Right    CARDIOVERSION     COLPOSCOPY     w/ cervical biopsy   DILITATION & CURRETTAGE/HYSTROSCOPY WITH NOVASURE ABLATION N/A 09/11/2014   Procedure:  DILATATION & CURETTAGE/HYSTEROSCOPY WITH NOVASURE ABLATION;  Surgeon: Lovenia Kim, MD;  Location: [facility];  Service: Gynecology;  Laterality: N/A;     reports that she has never smoked. She has been exposed to tobacco smoke. She has never used smokeless tobacco. She reports that she does not currently use alcohol after a past usage of about 2.0 standard drinks per week. She reports that she does not currently use drugs.  Allergies  Allergen Reactions   Amoxicillin Rash   Bactrim [Sulfamethoxazole-Trimethoprim] Rash    Rash noted to chest and back   Zithromax [Azithromycin] Rash    Family History  Problem Relation Age of Onset   Hypothyroidism Mother    Depression Mother    Cancer Mother        thyroid   Thyroid disease Mother    Hypertension Father    Heart attack Father        Age 33   AAA (abdominal aortic aneurysm) Father    Hypothyroidism Sister    Hyperthyroidism Sister    Diabetes Maternal Grandfather    Breast cancer Paternal Grandmother    Patient lives with her husband and son.  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  Prior to Admission medications   Medication  Sig Start Date End Date Taking? Authorizing Provider  amphetamine-dextroamphetamine (ADDERALL XR) 30 MG 24 hr capsule Take 30 mg by mouth daily as needed (ADHD).    [provider]  Ascorbic Acid (VITAMIN C PO) Take 1 tablet by mouth daily.    [provider]  BIOTIN PO Take 1 tablet by mouth daily.    [provider]  clonazePAM (KLONOPIN) 1 MG tablet Take 1 mg by mouth at bedtime. 01/20/21   [provider]  cloNIDine (CATAPRES) 0.1 MG tablet Take 0.1 mg by mouth 3 (three) times daily. Patient is taking once per day 05/10/21   [provider]  doxepin (SINEQUAN) 75 MG capsule Take 75 mg by mouth at bedtime. 12/22/20   [provider]  FLUoxetine (PROZAC) 20 MG capsule Take 60 mg by mouth  every morning. 04/01/21   [provider]  hyoscyamine (LEVSIN SL) 0.125 MG SL tablet Take 1 tablet by mouth as needed. 04/29/21   [provider]  MAGNESIUM PO Take 1 tablet by mouth daily.    [provider]  metoCLOPramide (REGLAN) 5 MG tablet Take 5 mg by mouth 2 (two) times daily. 04/28/21   [provider]  Multiple Vitamin (MULTIVITAMIN PO) Take 1 tablet by mouth daily.    [provider]  ondansetron (ZOFRAN ODT) 4 MG disintegrating tablet Take 1 tablet (4 mg total) by mouth every 8 (eight) hours as needed. 02/27/19   Marcial Pacas, MD  pantoprazole (PROTONIX) 40 MG tablet Take 40 mg by mouth 2 (two) times daily. 04/08/21   [provider]  potassium chloride SA (KLOR-CON) 20 MEQ tablet Take 2 tablets (40 mEq total) by mouth daily. Patient taking differently: Take 40 mEq by mouth 2 (two) times daily. 02/15/21   Charlesetta Shanks, MD  promethazine (PHENERGAN) 25 MG tablet Take 1 tablet (25 mg total) by mouth every 6 (six) hours as needed for nausea or vomiting. 02/15/21   Charlesetta Shanks, MD  sucralfate (CARAFATE) 1 g tablet Take 1 g by mouth 2 (two) times daily. 04/28/21   [provider]  topiramate (TOPAMAX) 50 MG tablet Take 50 mg by mouth 3 (three) times daily. 01/11/21   [provider]  vitamin B-12 (CYANOCOBALAMIN) 1000 MCG tablet Take 1,000 mcg by mouth daily.    [provider]  VITAMIN D PO Take 2,000 Units by mouth daily.    [provider]  zinc gluconate 50 MG tablet Take 50 mg by mouth daily.    [provider]    Physical Exam: Vitals:   05/13/21 1945 05/13/21 2000 05/13/21 2015 05/13/21 2030  BP: 98/69 108/81 (!) 89/63 99/72  Pulse: 87 76 78 66  Resp: (!) '21 18 19 19  '$ Temp:      TempSrc:      SpO2: 100% 98% 99% 99%  Weight:      Height:        Constitutional: NAD, calm, comfortable, thin Eyes: PERRL, lids and conjunctivae normal ENMT: Mucous membranes are dry.  Posterior pharynx  clear of any exudate or lesions.Normal dentition.  Neck: normal, supple, no masses, no thyromegaly Respiratory: clear to auscultation bilaterally, no wheezing, no crackles. Normal respiratory effort. No accessory muscle use.  Cardiovascular: Regular rate and rhythm, no murmurs / rubs / gallops. No extremity edema. 2+ pedal pulses. No carotid bruits.  Abdomen: no tenderness, no masses palpated. No hepatosplenomegaly. Bowel sounds positive.  Musculoskeletal: no clubbing / cyanosis. No joint deformity upper and lower extremities. Good  ROM, no contractures. Normal muscle tone.  Skin: no rashes, lesions, ulcers. No induration Neurologic: CN 2-12 grossly intact. Sensation intact, DTR normal. Strength 5/5 in all 4.  Psychiatric: Normal judgment and insight. Alert and oriented x 3. Normal mood.   Labs on Admission: I have personally reviewed following labs and imaging studies  CBC: Recent Labs  Lab 05/13/21 1434 05/13/21 1837  WBC 10.1 9.3  NEUTROABS 7.6 6.6  HGB 9.8* 9.9*  HCT 31.3* 33.0*  MCV 76.2* 77.8*  PLT 520* [id number]*   Basic Metabolic Panel: Recent Labs  Lab 05/13/21 1434 05/13/21 1837  NA 136 134*  K 2.5* 2.3*  CL 97* 93*  CO2 25 27  GLUCOSE 112* 98  BUN 13 15  CREATININE 1.49* 1.42*  CALCIUM 9.8 9.3  MG  --  2.3   GFR: Estimated Creatinine Clearance: 42.2 mL/min (A) (by C-G formula based on SCr of 1.42 mg/dL (H)). Liver Function Tests: Recent Labs  Lab 05/13/21 1434  AST 19  ALT 11  ALKPHOS 93  BILITOT 0.3  PROT 8.5*  ALBUMIN 4.3    Anemia Panel: Recent Labs    05/13/21 1434  FERRITIN 10*  TIBC 383  IRON 400*  RETICCTPCT 1.5   Urine analysis:    Component Value Date/Time   COLORURINE STRAW (A) 04/13/2021 1413   APPEARANCEUR CLOUDY (A) 04/13/2021 1413   LABSPEC 1.003 (L) 04/13/2021 1413   PHURINE 6.0 04/13/2021 1413   GLUCOSEU NEGATIVE 04/13/2021 1413   HGBUR SMALL (A) 04/13/2021 1413   BILIRUBINUR NEGATIVE 04/13/2021 1413   BILIRUBINUR Neg  02/25/2014 0858   KETONESUR NEGATIVE 04/13/2021 1413   PROTEINUR NEGATIVE 04/13/2021 1413   UROBILINOGEN 0.2 02/25/2014 0858   UROBILINOGEN 0.2 05/20/2009 0310   NITRITE POSITIVE (A) 04/13/2021 1413   LEUKOCYTESUR LARGE (A) 04/13/2021 1413    Radiological Exams on Admission: No results found.  EKG: Independently reviewed.  Reveals sinus rhythm and prolonged QT  Assessment/Plan Principal Problem:   Hypokalemia Active Problems:   Paroxysmal SVT (supraventricular tachycardia) (HCC)   AKI (acute kidney injury) (HCC)   Thyroid nodule   Intractable vomiting with nausea   IDA (iron deficiency anemia)  Profound hypokalemia For patient reports she last threw up approximately 3 days ago She denies diarrhea It is unclear where her potassium is going She reports compliance with 80 mEq of potassium daily Will get IV and p.o. repletion ongoing.  Paroxysmal SVT Will monitor  Acute kidney insufficiency IV fluid repletion Trend labs Avoid nephrotoxic agents  Thyroid nodule Has undergone neck sonography which suggested she needed FNA this has not been scheduled as yet and I have reviewed the importance of this given a family history of thyroid cancer.  Intractable nausea with vomiting Continue Protonix, Carafate Continue liquid diet Has Levsin as needed Reglan as needed Zofran as needed Phenergan as needed Additional antiemetics will be hard to add on given her prolonged QT--> could consider scopolamine patch [facility] if needed for consultation.  Iron deficiency anemia Still with low ferritin although iron seems to be improving. Her hemoglobin has risen to 9.9 She is undergone endometrial ablation no longer has cycles She denies acute GI bleeding  Hiatal hernia with history of duodenal ulcer Continue Protonix  Depression/anxiety/ADHD On Prozac 60 mg daily Topamax 150 mg daily Adderall 30 mg up to 3 times daily though she does not take this.  Doxepin 75 mg nightly and  Adderall 30 mg  Headaches Continue Topamax  Polypharmacy Consider having pharmacy go through her  med list and see if any of these are contributing to her loss of potassium or her nausea vomiting.  DVT prophylaxis: Lovenox SQ Code Status: Full code  Family Communication: Husband at bedside Disposition Plan: Home Consults called: None Admission status: Inpatient patient is admitted to inpatient and remains there due to need for telemetry monitoring and IV fluid and electrolyte repletion.   Donnamae Jude MD [facility]  If 7PM-7AM, please contact night-coverage 05/13/2021, 8:39 PM

## 2021-05-13 NOTE — ED Triage Notes (Signed)
Sent from the cancer center, gets trt for IDA, K was 2.5 today. Takes 4 20 meq of K daily. Reports generalized fatigue. States constant N/V multiple times per week.

## 2021-05-13 NOTE — Progress Notes (Signed)
[place] [phone]   Fax:([phone] [place] NOTE  Patient Care Team: [name], MD as PCP - General (Internal Medicine)  Hematological/Oncological History 1) 04/04/2021: Labs from [facility]: -Ferritin 15.6 (L), TIBC 424, Serum iron <10 (L),iron saturation was too low to calculate, transferrin 303. WBC 17.6 (H), Hgb 8.7 (L), MCV 76.9 (L), Plt 548 (H), ANC 13.6 (H), Vitamin B12 788.   2) 04/13/2021-04/14/2021: Admitted for nausea, vomiting or diarrhea. Labs show ferritin <5 (L), iron 20 (L), TIBC 387, iron saturation 5% (L), WBC 6.9, Hgb 8.2 (L), MCV 86.0, Plt 408 (L).   3) 05/13/2021: Establish care with [facility] Hematology/Oncology  CHIEF COMPLAINTS/PURPOSE OF CONSULTATION:  "Iron deficiency anemia "  HISTORY OF PRESENTING ILLNESS:  Kharis Schuneman Mazariego 50 y.o. female with medical history significant for attention deficit disorder,anxiety, depression, hyperlipidemia, hypokalemia, migraines, thyroid disorder and arhythmias.Patient is accompanied by her husband for this visit.   On exam today. Ms. Norwick reports persistent fatigue. She is in bed for more that 50% of the day. Her appetite has decreased due to ongoing issues with nausea and vomiting. She is currently under the care of [facility]. Patient struggles with constipation and how a bowel movement 2-3 times per week. She is currently taking Linzess with improvement of constipation. She denies easy bruising or signs of bleeding. This includes hematochezia, melena, hematuria, epistaxis, hemoptysis and gingival bleeding. She has shortness of breath with exertion but none at rest. She denies fevers, chills, night sweats, chest pain or cough. Rest of the 10 point ROS is below.  MEDICAL HISTORY:  Past Medical History:  Diagnosis Date   Abnormal TSH    ADD (attention deficit disorder)    Anemia    Anxiety    Arrhythmia    Decreased libido    Depression    Heart murmur    Hyperlipidemia     Hypokalemia    Migraine    Nephrolithiasis    Pap smear abnormality of cervix/human papillomavirus (HPV) positive    Sleep difficulties    SVT (supraventricular tachycardia) ([place])     SURGICAL HISTORY: Past Surgical History:  Procedure Laterality Date   ABLATION     Cardiac ablation, endometrial ablation   BREAST LUMPECTOMY     right breast   BREAST LUMPECTOMY Right    CARDIOVERSION     COLPOSCOPY     w/ cervical biopsy   DILITATION & CURRETTAGE/HYSTROSCOPY WITH NOVASURE ABLATION N/A 09/11/2014   Procedure: DILATATION & CURETTAGE/HYSTEROSCOPY WITH NOVASURE ABLATION;  Surgeon: Lovenia Kim, MD;  Location: [facility];  Service: Gynecology;  Laterality: N/A;    SOCIAL HISTORY: Social History   Socioeconomic History   Marital status: Legally Separated    Spouse name: Not on file   Number of children: 2   Years of education: college   Highest education level: Not on file  Occupational History   Occupation: Therapist, 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   Right-handed.   60 ounces of tea and soda per day.   Social Determinants of Health   Financial Resource Strain: Not on file  Food Insecurity: Not on file  Transportation Needs: Not on file  Physical Activity: Not on file  Stress: Not on file  Social Connections: Not on file  Intimate Partner Violence: Not on file    FAMILY HISTORY: Family History  Problem Relation Age of Onset   Hypothyroidism Mother    Depression Mother    Cancer Mother         thyroid   Thyroid disease Mother    Hypertension Father    Heart attack Father        Age 5   AAA (abdominal aortic aneurysm) Father    Hypothyroidism Sister    Hyperthyroidism Sister    Diabetes Maternal Grandfather    Breast cancer Paternal Grandmother     ALLERGIES:  is allergic to amoxicillin, bactrim [sulfamethoxazole-trimethoprim], and zithromax [azithromycin].  MEDICATIONS:  No current facility-administered medications for this visit.   No current outpatient medications on file.   Facility-Administered Medications Ordered in Other Visits  Medication Dose Route Frequency Provider Last Rate Last Admin   acetaminophen (TYLENOL) tablet 650 mg  650 mg Oral Q6H PRN Donnamae Jude, MD   650 mg at 05/16/21 [id number]   Or   acetaminophen (TYLENOL) suppository 650 mg  650 mg Rectal Q6H PRN Donnamae Jude, MD       clonazePAM Bobbye [place]) tablet 1 mg  1 mg Oral QHS Donnamae Jude, MD   1 mg at 05/15/21 2327   ferric gluconate (FERRLECIT) 250 mg in sodium chloride 0.9 % 250 mL IVPB  250 mg Intravenous Daily Domenic Polite, MD 135 mL/hr at 05/16/21 0940 250 mg at 05/16/21 0940   FLUoxetine (PROZAC) capsule 20 mg  20 mg Oral Daily Domenic Polite, MD   20 mg at 05/15/21 1119   HYDROcodone-acetaminophen (NORCO/VICODIN) 5-325 MG per tablet 1-2 tablet  1-2 tablet Oral Q4H PRN Donnamae Jude, MD       hyoscyamine (LEVSIN SL) SL tablet 0.125 mg  0.125 mg Oral Daily PRN Donnamae Jude, MD       ondansetron (ZOFRAN-ODT) disintegrating tablet 8 mg  8 mg Oral Q8H PRN Donnamae Jude, MD       pantoprazole (PROTONIX) EC tablet 40 mg  40 mg Oral BID Donnamae Jude, MD   40 mg at 05/15/21 2328   polyethylene glycol (MIRALAX / GLYCOLAX) packet 17 g  17 g Oral Daily PRN Donnamae Jude, MD       potassium chloride SA (KLOR-CON) CR tablet 40 mEq  40 mEq Oral BID Domenic Polite, MD   40 mEq at 05/15/21 2328   promethazine (PHENERGAN) tablet 25 mg  25 mg Oral Q6H PRN Donnamae Jude, MD       sucralfate  (CARAFATE) tablet 1 g  1 g Oral BID Donnamae Jude, MD   1 g at 05/15/21 2328   topiramate (TOPAMAX) tablet 50 mg  50 mg Oral BID Domenic Polite, MD   50 mg at 05/15/21 2328   vitamin B-12 (CYANOCOBALAMIN) tablet 1,000 mcg  1,000 mcg Oral Daily Donnamae Jude, MD   1,000 mcg at 05/15/21 1003    REVIEW OF SYSTEMS:   Constitutional: ( - ) fevers, ( - )  chills , ( - ) night  sweats Eyes: ( - ) blurriness of vision, ( - ) double vision, ( - ) watery eyes Ears, nose, mouth, throat, and face: ( - ) mucositis, ( - ) sore throat Respiratory: ( - ) cough, ( + ) dyspnea, ( - ) wheezes Cardiovascular: ( - ) palpitation, ( - ) chest discomfort, ( - ) lower extremity swelling Gastrointestinal:  ( + ) nausea, ( - ) heartburn, ( - ) change in bowel habits Skin: ( - ) abnormal skin rashes Lymphatics: ( - ) new lymphadenopathy, ( - ) easy bruising Neurological: ( - ) numbness, ( - ) tingling, ( - ) new weaknesses Behavioral/Psych: ( - ) mood change, ( - ) new changes  All other systems were reviewed with the patient and are negative.  PHYSICAL EXAMINATION: ECOG PERFORMANCE STATUS: 1 - Symptomatic but completely ambulatory  Vitals:   05/13/21 1320  BP: (!) 128/92  Pulse: (!) 108  Resp: 17  Temp: 99.1 F (37.3 C)  SpO2: 99%   Filed Weights   05/13/21 1320  Weight: 130 lb (59 kg)    GENERAL: well appearing female in NAD  SKIN: skin color, texture, turgor are normal, no rashes or significant lesions EYES: conjunctiva are pink and non-injected, sclera clear OROPHARYNX: no exudate, no erythema; lips, buccal mucosa, and tongue normal  LYMPH:  no palpable lymphadenopathy in the cervical, axillary or supraclavicular lymph nodes.  LUNGS: clear to auscultation and percussion with normal breathing effort HEART: regular rate & rhythm and no murmurs and no lower extremity edema ABDOMEN: soft, non-tender, non-distended, normal bowel sounds Musculoskeletal: no cyanosis of digits and no clubbing  PSYCH:  alert & oriented x 3, fluent speech NEURO: no focal motor/sensory deficits  LABORATORY DATA:  I have reviewed the data as listed CBC Latest Ref Rng & Units 05/16/2021 05/15/2021 05/14/2021  WBC 4.0 - 10.5 K/uL 7.0 7.0 7.6  Hemoglobin 12.0 - 15.0 g/dL 9.4(L) 8.7(L) 7.3(L)  Hematocrit 36.0 - 46.0 % 32.0(L) 28.9(L) 24.3(L)  Platelets 150 - 400 K/uL 358 340 347    CMP Latest Ref Rng & Units 05/16/2021 05/15/2021 05/14/2021  Glucose 70 - 99 mg/dL 99 102(H) 99  BUN 6 - 20 mg/dL <5(L) 9 12  Creatinine 0.44 - 1.00 mg/dL 0.79 0.78 1.17(H)  Sodium 135 - 145 mmol/L 138 139 137  Potassium 3.5 - 5.1 mmol/L 4.5 4.0 2.8(L)  Chloride 98 - 111 mmol/L 111 113(H) 104  CO2 22 - 32 mmol/L '24 23 25  '$ Calcium 8.9 - 10.3 mg/dL 8.7(L) 8.2(L) 7.9(L)  Total Protein 6.5 - 8.1 g/dL 6.4(L) 6.1(L) 6.5  Total Bilirubin 0.3 - 1.2 mg/dL 0.5 0.5 0.4  Alkaline Phos 38 - 126 U/L 57 56 59  AST 15 - 41 U/L '21 17 21  '$ ALT 0 - 44 U/L '12 11 10    '$ RADIOGRAPHIC STUDIES: I have personally reviewed the radiological images as listed and agreed with the findings in the report. No results found.  ASSESSMENT & PLAN SHRESHTA VISAYA is a 50 y.o. female who presents to the clinic for evaluation for iron deficiency anemia. Patient is currently on ferrous sulfate 325 mg once daily since last Thursday with good tolerance. Patient underwent EGD on 04/27/2021 that revealed esophageal ulcers with no bleeding or stigmata of recent bleeding, large hiatal hernia, abnormal esophageal motility. Biopsies were negative for celiac disease, H.pylori.   Recommend to proceed with labs today to repeat CBC, CMP, iron and TIBC, ferritin and retic panel. If there is persistent  iron deficiency anemia with Hgb <10, we recommend IV iron infusion due to patient's severe fatigue.   #Iron deficiency anemia: --Etiology unknown. Patient denies any signs of active bleeding. Under the care of [facility]. EGD from 04/27/2021 did not indicate active bleeding but evidence of  esophageal ulcers. Colonoscopy scheduled for November 2022.  --Recent started ferrous sulfate 325 mg once daily last week. Advised to continue with a source of vitamin C. Avoid taking it same time as antiacids --Labs today to check CBC, CMP, iron and TIBC, ferritin, retic panel --If persistent anemia with Hgb less than 10, recommend once weekly IV venofer x 5 doses --RTC approximately 8 weeks with repeat labs.    #Hypokalemia: --Patient is currently taking potassium chloride 80 mEq daily  --Potassium level today was 2.5. Instructed patient to go to there ED for further management including IV replacement.  --Likely due to recurrent nausea/vomiting.   Orders Placed This Encounter  Procedures   CBC with Differential ([place] Only)    Standing Status:   Future    Number of Occurrences:   1    Standing Expiration Date:   05/12/2022   CMP ([place] only)    Standing Status:   Future    Number of Occurrences:   1    Standing Expiration Date:   05/12/2022   Ferritin    Standing Status:   Future    Number of Occurrences:   1    Standing Expiration Date:   05/12/2022   Iron and TIBC    Standing Status:   Future    Number of Occurrences:   1    Standing Expiration Date:   05/12/2022   Retic Panel    Standing Status:   Future    Number of Occurrences:   1    Standing Expiration Date:   05/12/2022    All questions were answered. The patient knows to call the clinic with any problems, questions or concerns.  I have spent a total of 60 minutes minutes of face-to-face and non-face-to-face time, preparing to see the patient, obtaining and/or reviewing separately obtained history, performing a medically appropriate examination, counseling and educating the patient, ordering medications/tests, documenting clinical information in the electronic health record, and care coordination.   Dede Query, PA-C Department of Hematology/Oncology [place] at [facility] Phone: [phone]  Patient was seen with Dr. Lorenso Courier.  I have read the above note and personally examined the patient. I agree with the assessment and plan as noted above.  Briefly Mrs. Delellis is a 50 year old female with medical history significant for iron deficiency anemia of unclear etiology.  She is currently under the care of of GI at [facility].  She is scheduled for colonoscopy in November 2022.  Given her relatively mild anemia would recommend that she proceed with p.o. iron therapy 325 mg daily.  In the event that she were to have worsening anemia or p.o. iron repletion was not adequate recommend proceeding with IV iron therapy.  The patient voiced understanding of this plan moving forward.   Ledell Peoples, MD Department of Hematology/Oncology [place] at [facility] Phone: [phone] Pager: [phone] Email: [email or web address]

## 2021-05-14 DIAGNOSIS — E876 Hypokalemia: Principal | ICD-10-CM

## 2021-05-14 LAB — CBC
HCT: 24.3 % — ABNORMAL LOW (ref 36.0–46.0)
Hemoglobin: 7.3 g/dL — ABNORMAL LOW (ref 12.0–15.0)
MCH: 24 pg — ABNORMAL LOW (ref 26.0–34.0)
MCHC: 30 g/dL (ref 30.0–36.0)
MCV: 79.9 fL — ABNORMAL LOW (ref 80.0–100.0)
Platelets: 347 10*3/uL (ref 150–400)
RBC: 3.04 MIL/uL — ABNORMAL LOW (ref 3.87–5.11)
RDW: 17 % — ABNORMAL HIGH (ref 11.5–15.5)
WBC: 7.6 10*3/uL (ref 4.0–10.5)
nRBC: 0 % (ref 0.0–0.2)

## 2021-05-14 LAB — RESP PANEL BY RT-PCR (FLU A&B, COVID) ARPGX2
Influenza A by PCR: NEGATIVE
Influenza B by PCR: NEGATIVE
SARS Coronavirus 2 by RT PCR: NEGATIVE

## 2021-05-14 LAB — COMPREHENSIVE METABOLIC PANEL
ALT: 10 U/L (ref 0–44)
AST: 21 U/L (ref 15–41)
Albumin: 3.3 g/dL — ABNORMAL LOW (ref 3.5–5.0)
Alkaline Phosphatase: 59 U/L (ref 38–126)
Anion gap: 8 (ref 5–15)
BUN: 12 mg/dL (ref 6–20)
CO2: 25 mmol/L (ref 22–32)
Calcium: 7.9 mg/dL — ABNORMAL LOW (ref 8.9–10.3)
Chloride: 104 mmol/L (ref 98–111)
Creatinine, Ser: 1.17 mg/dL — ABNORMAL HIGH (ref 0.44–1.00)
GFR, Estimated: 56 mL/min — ABNORMAL LOW (ref >60)
Glucose, Bld: 99 mg/dL (ref 70–99)
Potassium: 2.8 mmol/L — ABNORMAL LOW (ref 3.5–5.1)
Sodium: 137 mmol/L (ref 135–145)
Total Bilirubin: 0.4 mg/dL (ref 0.3–1.2)
Total Protein: 6.5 g/dL (ref 6.5–8.1)

## 2021-05-14 LAB — PREPARE RBC (CROSSMATCH)

## 2021-05-14 MED ORDER — SODIUM CHLORIDE 0.9 % IV BOLUS
1000.0000 mL | Freq: Once | INTRAVENOUS | Status: AC
  Administered 2021-05-14: 1000 mL via INTRAVENOUS

## 2021-05-14 MED ORDER — POTASSIUM CHLORIDE CRYS ER 20 MEQ PO TBCR
40.0000 meq | EXTENDED_RELEASE_TABLET | Freq: Three times a day (TID) | ORAL | Status: AC
  Administered 2021-05-14 (×3): 40 meq via ORAL
  Filled 2021-05-14 (×3): qty 2

## 2021-05-14 MED ORDER — SODIUM CHLORIDE 0.9% IV SOLUTION: Freq: Once | INTRAVENOUS | Status: AC

## 2021-05-14 NOTE — Progress Notes (Signed)
PROGRESS NOTE    [place]ily [place]  [id number] DOB: 1971/03/01 DOA: 05/13/2021 PCP: [name], MD  Brief Narrative: 50 year old female with history of hiatal hernia, iron deficiency anemia, thyroid nodules, dyslipidemia, hypothyroidism, has had problems with ongoing nausea and vomiting since January 2022, she was originally admitted with hypovolemic shock in January, at that time adrenal insufficiency was also suspected but this was ruled out by endocrinologist at follow-up. -She was readmitted with nausea and vomiting, anemia in August'22, seen by GI then, recommended outpatient follow-up for EGD and colonoscopy, reportedly had an EGD done which noted hiatal hernia, scheduled for a colonoscopy in November. -She is also had severe hypokalemia for several months and takes supplemental K -Yesterday she had labs drawn at the hematologist office where she is followed for iron deficiency anemia where she was noted to have a potassium of 2.5, she was sent to the ED for work-up, repeat potassium was 2.3, repeat hemoglobin 7.3 this morning, microcytic  Assessment & Plan:  Severe hypokalemia -From GI losses, replace TID today -Mag level was 2.3 yesterday -Repeat in a.m.  Chronic nausea and vomiting -Etiology remains unclear, reportedly had an EGD in August which noted hiatal hernia ? Duodenal ulcer, unable to access this -Continue PPI and Carafate, Levsin PRN -Phenergan as needed -Will request gastroenterology evaluation in the setting of ongoing symptoms, severe iron deficiency anemia and need for colonoscopy -Unclear if polypharmacy is also contributing to some of her symptoms  Iron deficiency anemia -Source is unclear, patient reports previously having some menorrhagia but had an endometrial ablation, no longer has menstrual cycles -Hemoglobin 7.3 today, anemia panel with iron deficiency, improving -Will give IV iron tomorrow -GI consult   Anxiety, depression ADHD -Home regimen of  Adderall, Klonopin, doxepin, Prozac and Topamax continued -I wonder if some of this could be contributing to her nausea -will ask Pharmacy to review  History of headaches -Continue Topamax  Thyroid nodule -Following with an endocrinologist for this, due to have additional work-up  DVT prophylaxis: Lovenox Code Status: Full code Family Communication: No family at bedside Disposition Plan:  Status is: Inpatient  Remains inpatient appropriate because:Inpatient level of care appropriate due to severity of illness  Dispo: The patient is from: Home              Anticipated d/c is to: Home              Patient currently is not medically stable to d/c.   Difficult to place patient No        Consultants:  Gastroenterology  Procedures:   Antimicrobials:    Subjective: -Has chronic nausea, no vomiting today  Objective: Vitals:   05/14/21 0800 05/14/21 0830 05/14/21 0900 05/14/21 0945  BP: 106/73 121/84 102/71 108/69  Pulse: 69 70 73 73  Resp: 18 (!) '23 17 17  '$ Temp:      TempSrc:      SpO2: 100% (!) 62% 94% 100%  Weight:      Height:       No intake or output data in the 24 hours ending 05/14/21 1027 Filed Weights   05/13/21 1702  Weight: 59 kg    Examination:  General exam: Pleasant ill-appearing female, laying in bed, AAOx3 HEENT: No JVD CVS: S1-S2, regular rate rhythm Lungs: Clear bilaterally Abdomen: Soft, nontender, bowel sounds present Extremities: No edema Skin: No rash on exposed skin Psych: Appropriate mood and affect  Data Reviewed:   CBC: Recent Labs  Lab 05/13/21 1434 05/13/21  1837 05/14/21 0453  WBC 10.1 9.3 7.6  NEUTROABS 7.6 6.6  --   HGB 9.8* 9.9* 7.3*  HCT 31.3* 33.0* 24.3*  MCV 76.2* 77.8* 79.9*  PLT 520* 516* [id number]   Basic Metabolic Panel: Recent Labs  Lab 05/13/21 1434 05/13/21 1837 05/14/21 0453  NA 136 134* 137  K 2.5* 2.3* 2.8*  CL 97* 93* 104  CO2 '25 27 25  '$ GLUCOSE 112* 98 99  BUN '13 15 12  '$ CREATININE 1.49* 1.42*  1.17*  CALCIUM 9.8 9.3 7.9*  MG  --  2.3  --    GFR: Estimated Creatinine Clearance: 51.2 mL/min (A) (by C-G formula based on SCr of 1.17 mg/dL (H)). Liver Function Tests: Recent Labs  Lab 05/13/21 1434 05/14/21 0453  AST 19 21  ALT 11 10  ALKPHOS 93 59  BILITOT 0.3 0.4  PROT 8.5* 6.5  ALBUMIN 4.3 3.3*   No results for input(s): LIPASE, AMYLASE in the last 168 hours. No results for input(s): AMMONIA in the last 168 hours. Coagulation Profile: No results for input(s): INR, PROTIME in the last 168 hours. Cardiac Enzymes: No results for input(s): CKTOTAL, CKMB, CKMBINDEX, TROPONINI in the last 168 hours. BNP (last 3 results) No results for input(s): PROBNP in the last 8760 hours. HbA1C: No results for input(s): HGBA1C in the last 72 hours. CBG: No results for input(s): GLUCAP in the last 168 hours. Lipid Profile: No results for input(s): CHOL, HDL, LDLCALC, TRIG, CHOLHDL, LDLDIRECT in the last 72 hours. Thyroid Function Tests: No results for input(s): TSH, T4TOTAL, FREET4, T3FREE, THYROIDAB in the last 72 hours. Anemia Panel: Recent Labs    05/13/21 1434  FERRITIN 10*  TIBC 383  IRON 400*  RETICCTPCT 1.5   Urine analysis:    Component Value Date/Time   COLORURINE STRAW (A) 04/13/2021 1413   APPEARANCEUR CLOUDY (A) 04/13/2021 1413   LABSPEC 1.003 (L) 04/13/2021 1413   PHURINE 6.0 04/13/2021 1413   GLUCOSEU NEGATIVE 04/13/2021 1413   HGBUR SMALL (A) 04/13/2021 1413   BILIRUBINUR NEGATIVE 04/13/2021 1413   BILIRUBINUR Neg 02/25/2014 0858   KETONESUR NEGATIVE 04/13/2021 1413   PROTEINUR NEGATIVE 04/13/2021 1413   UROBILINOGEN 0.2 02/25/2014 0858   UROBILINOGEN 0.2 05/20/2009 0310   NITRITE POSITIVE (A) 04/13/2021 1413   LEUKOCYTESUR LARGE (A) 04/13/2021 1413   Sepsis Labs: '@LABRCNTIP'$ (procalcitonin:4,lacticidven:4)  ) Recent Results (from the past 240 hour(s))  Resp Panel by RT-PCR (Flu A&B, Covid) Nasopharyngeal Swab     Status: None   Collection Time:  05/14/21 12:44 AM   Specimen: Nasopharyngeal Swab; Nasopharyngeal(NP) swabs in vial transport medium  Result Value Ref Range Status   SARS Coronavirus 2 by RT PCR NEGATIVE NEGATIVE Final    Comment: (NOTE) SARS-CoV-2 target nucleic acids are NOT DETECTED.  The SARS-CoV-2 RNA is generally detectable in upper respiratory specimens during the acute phase of infection. The lowest concentration of SARS-CoV-2 viral copies this assay can detect is 138 copies/mL. A negative result does not preclude SARS-Cov-2 infection and should not be used as the sole basis for treatment or other patient management decisions. A negative result may occur with  improper specimen collection/handling, submission of specimen other than nasopharyngeal swab, presence of viral mutation(s) within the areas targeted by this assay, and inadequate number of viral copies(<138 copies/mL). A negative result must be combined with clinical observations, patient history, and epidemiological information. The expected result is Negative.  Fact Sheet for Patients:  EntrepreneurPulse.com.au  Fact Sheet for Healthcare Providers:  IncredibleEmployment.be  This  test is no t yet approved or cleared by the [place] and  has been authorized for detection and/or diagnosis of SARS-CoV-2 by FDA under an Emergency Use Authorization (EUA). This EUA will remain  in effect (meaning this test can be used) for the duration of the COVID-19 declaration under Section 564(b)(1) of the Act, 21 U.S.C.section 360bbb-3(b)(1), unless the authorization is terminated  or revoked sooner.       Influenza A by PCR NEGATIVE NEGATIVE Final   Influenza B by PCR NEGATIVE NEGATIVE Final    Comment: (NOTE) The Xpert Xpress SARS-CoV-2/FLU/RSV plus assay is intended as an aid in the diagnosis of influenza from Nasopharyngeal swab specimens and should not be used as a sole basis for treatment. Nasal washings  and aspirates are unacceptable for Xpert Xpress SARS-CoV-2/FLU/RSV testing.  Fact Sheet for Patients: EntrepreneurPulse.com.au  Fact Sheet for Healthcare Providers: IncredibleEmployment.be  This test is not yet approved or cleared by the [place] FDA and has been authorized for detection and/or diagnosis of SARS-CoV-2 by FDA under an Emergency Use Authorization (EUA). This EUA will remain in effect (meaning this test can be used) for the duration of the COVID-19 declaration under Section 564(b)(1) of the Act, 21 U.S.C. section 360bbb-3(b)(1), unless the authorization is terminated or revoked.  Performed at [facility], [place] [address] [place], [place] [address]      Scheduled Meds:  sodium chloride   Intravenous Once   clonazePAM  1 mg Oral QHS   pantoprazole  40 mg Oral BID   potassium chloride SA  40 mEq Oral TID   sucralfate  1 g Oral BID   topiramate  50 mg Oral TID   vitamin B-12  1,000 mcg Oral Daily   Continuous Infusions:  0.9 % NaCl with KCl 40 mEq / L 100 mL/hr at 05/14/21 0850     LOS: 1 day    Time spent: 70mn  PDomenic Polite MD [facility]   05/14/2021, 10:27 AM

## 2021-05-14 NOTE — Progress Notes (Signed)
MEDICATION RELATED CONSULT NOTE - FOLLOW UP   Pharmacy Consult for medication review Indication: ongoing nausea and vomiting   Allergies  Allergen Reactions   Amoxicillin Rash   Bactrim [Sulfamethoxazole-Trimethoprim] Rash    Rash noted to chest and back   Zithromax [Azithromycin] Rash    Patient Measurements: Height: '5\' 5"'$  (165.1 cm) Weight: 59 kg (130 lb 1.6 oz) IBW/kg (Calculated) : 57  Labs: Recent Labs    05/13/21 1434 05/13/21 1837 05/14/21 0453  WBC 10.1 9.3 7.6  HGB 9.8* 9.9* 7.3*  HCT 31.3* 33.0* 24.3*  PLT 520* 516* 347  CREATININE 1.49* 1.42* 1.17*  MG  --  2.3  --   ALBUMIN 4.3  --  3.3*  PROT 8.5*  --  6.5  AST 19  --  21  ALT 11  --  10  ALKPHOS 93  --  59  BILITOT 0.3  --  0.4   Estimated Creatinine Clearance: 51.2 mL/min (A) (by C-G formula based on SCr of 1.17 mg/dL (H)).     Medications:  Medications Prior to Admission  Medication Sig Dispense Refill Last Dose   amphetamine-dextroamphetamine (ADDERALL XR) 30 MG 24 hr capsule Take 30 mg by mouth daily as needed (ADHD).   unknown   Ascorbic Acid (VITAMIN C PO) Take 1 tablet by mouth daily.   Past Week   clonazePAM (KLONOPIN) 1 MG tablet Take 1 mg by mouth at bedtime.   05/12/2021   doxepin (SINEQUAN) 75 MG capsule Take 75 mg by mouth at bedtime.   05/12/2021   FLUoxetine (PROZAC) 20 MG capsule Take 60 mg by mouth every morning.   05/13/2021   hyoscyamine (LEVSIN SL) 0.125 MG SL tablet Take 1-2 tablets by mouth daily as needed for cramping.   Past Week   MAGNESIUM PO Take 1 tablet by mouth daily.   05/13/2021   metoCLOPramide (REGLAN) 5 MG tablet Take 5 mg by mouth 2 (two) times daily as needed for nausea or vomiting.   unknown   Multiple Vitamin (MULTIVITAMIN PO) Take 1 tablet by mouth daily.   05/13/2021   ondansetron (ZOFRAN-ODT) 8 MG disintegrating tablet Take 8 mg by mouth every 8 (eight) hours as needed for nausea or vomiting.   unknown   pantoprazole (PROTONIX) 40 MG tablet Take 40 mg by mouth 2  (two) times daily.   05/13/2021   potassium chloride SA (KLOR-CON) 20 MEQ tablet Take 2 tablets (40 mEq total) by mouth daily. (Patient taking differently: Take 40 mEq by mouth 2 (two) times daily.) 20 tablet 0 05/13/2021   promethazine (PHENERGAN) 25 MG tablet Take 1 tablet (25 mg total) by mouth every 6 (six) hours as needed for nausea or vomiting. 20 tablet 0 unknown   sucralfate (CARAFATE) 1 g tablet Take 1 g by mouth 2 (two) times daily.   05/13/2021   topiramate (TOPAMAX) 50 MG tablet Take 50 mg by mouth 3 (three) times daily.   05/13/2021   vitamin B-12 (CYANOCOBALAMIN) 1000 MCG tablet Take 1,000 mcg by mouth daily.   05/13/2021   VITAMIN D PO Take 2,000 Units by mouth daily.   05/13/2021   zinc gluconate 50 MG tablet Take 50 mg by mouth daily.   05/13/2021    Assessment: 73 yoF admitted with recurrent N/V.  Pharmacy is consulted due to polypharmacy with ongoing nausea and vomiting for 13month, please review med list.  Review of home meds shows the following medications most likely to be contributing to GI side effects.  See list below for types and incidence of GI side effects.  Fluoxetine:  Anorexia (4% to 17%), diarrhea (8% to 18%), nausea (12% to 29%), xerostomia (9% to 12%), Constipation (5%), dysgeusia (1%), dyspepsia (6% to 10%), flatulence (3%), increased appetite (1%), vomiting (3%)   Topiramate:  Abdominal pain (6% to 15%), anorexia (4% to 15%), diarrhea (2% to 11%), dysgeusia (3% to 15%), nausea (8% to 13%)   Adderall:  Abdominal pain (11% to 14%), anorexia (22% to 36%), decreased appetite (22% to 30%), xerostomia (23% to 35%), Bruxism (2%), constipation (2% to 4%), diarrhea (3% to 6%), dyspepsia (2% to 4%), nausea (2% to 8%), teeth clenching (4%), tooth infection (4%)   Protonix:  Abdominal pain (4%), constipation (4%), diarrhea (>4%; adults: 9%), flatulence (4%), nausea (4%), vomiting (4%), xerostomia (2%)   Doxepin:   Nausea (chronic insomnia patients 2%), gastroenteritis (chronic  insomnia patients 2%), anorexia, aphthous stomatitis, constipation, diarrhea, dysgeusia, dyspepsia, vomiting, xerostomia    Plan:  Pharmacy to sign off.  Please call or re-consult with further questions.     Gretta Arab PharmD, BCPS Clinical Pharmacist [facility] main pharmacy [phone] 05/14/2021 12:27 PM

## 2021-05-15 LAB — COMPREHENSIVE METABOLIC PANEL
ALT: 11 U/L (ref 0–44)
AST: 17 U/L (ref 15–41)
Albumin: 3.2 g/dL — ABNORMAL LOW (ref 3.5–5.0)
Alkaline Phosphatase: 56 U/L (ref 38–126)
Anion gap: 3 — ABNORMAL LOW (ref 5–15)
BUN: 9 mg/dL (ref 6–20)
CO2: 23 mmol/L (ref 22–32)
Calcium: 8.2 mg/dL — ABNORMAL LOW (ref 8.9–10.3)
Chloride: 113 mmol/L — ABNORMAL HIGH (ref 98–111)
Creatinine, Ser: 0.78 mg/dL (ref 0.44–1.00)
GFR, Estimated: >60 mL/min (ref >60)
Glucose, Bld: 102 mg/dL — ABNORMAL HIGH (ref 70–99)
Potassium: 4 mmol/L (ref 3.5–5.1)
Sodium: 139 mmol/L (ref 135–145)
Total Bilirubin: 0.5 mg/dL (ref 0.3–1.2)
Total Protein: 6.1 g/dL — ABNORMAL LOW (ref 6.5–8.1)

## 2021-05-15 LAB — CBC
HCT: 28.9 % — ABNORMAL LOW (ref 36.0–46.0)
Hemoglobin: 8.7 g/dL — ABNORMAL LOW (ref 12.0–15.0)
MCH: 24.7 pg — ABNORMAL LOW (ref 26.0–34.0)
MCHC: 30.1 g/dL (ref 30.0–36.0)
MCV: 82.1 fL (ref 80.0–100.0)
Platelets: 340 10*3/uL (ref 150–400)
RBC: 3.52 MIL/uL — ABNORMAL LOW (ref 3.87–5.11)
RDW: 17.1 % — ABNORMAL HIGH (ref 11.5–15.5)
WBC: 7 10*3/uL (ref 4.0–10.5)
nRBC: 0 % (ref 0.0–0.2)

## 2021-05-15 MED ORDER — TOPIRAMATE 25 MG PO TABS
50.0000 mg | ORAL_TABLET | Freq: Two times a day (BID) | ORAL | Status: DC
  Administered 2021-05-15 (×2): 50 mg via ORAL
  Filled 2021-05-15 (×3): qty 2

## 2021-05-15 MED ORDER — FLUOXETINE HCL 20 MG PO CAPS
20.0000 mg | ORAL_CAPSULE | Freq: Every day | ORAL | Status: DC
  Administered 2021-05-15: 20 mg via ORAL
  Filled 2021-05-15 (×2): qty 1

## 2021-05-15 MED ORDER — PEG 3350-KCL-NA BICARB-NACL 420 G PO SOLR
4000.0000 mL | Freq: Once | ORAL | Status: AC
  Administered 2021-05-15: 4000 mL via ORAL
  Filled 2021-05-15: qty 4000

## 2021-05-15 MED ORDER — POTASSIUM CHLORIDE CRYS ER 20 MEQ PO TBCR
40.0000 meq | EXTENDED_RELEASE_TABLET | Freq: Two times a day (BID) | ORAL | Status: AC
  Administered 2021-05-15 (×2): 40 meq via ORAL
  Filled 2021-05-15 (×2): qty 2

## 2021-05-15 MED ORDER — SODIUM CHLORIDE 0.9 % IV SOLN
250.0000 mg | Freq: Every day | INTRAVENOUS | Status: DC
  Administered 2021-05-15 – 2021-05-16 (×2): 250 mg via INTRAVENOUS
  Filled 2021-05-15 (×3): qty 20

## 2021-05-15 NOTE — Consult Note (Signed)
Referring Provider:  [facility] Primary Care Physician:  [name], MD Primary Gastroenterologist:  [facility]   Reason for Consultation: Iron deficiency anemia  HPI: Emily Park is a 50 y.o. female with past medical history of hypothyroidism, hyperlipidemia and iron deficiency anemia admitted to the hospital with abnormal labs with potassium of 2.3  He was seen in the [facility] recently for nausea, vomiting and deficiency anemia.  EGD on April 27, 2021 showed fluid in esophagus, abnormal esophageal motility, esophageal ulcers, large hiatal hernia, retained food in the stomach.  Biopsies were negative for H. pylori and celiac disease.  Esophageal biopsy showed ulcer from reflux.  Gastric emptying scan was recommended.  We were planning to have colonoscopy done in November once her nausea and vomiting were under control.  Blood work upon initial evaluation in the emergency room showed hemoglobin of 7.3, potassium of 2.8 , normal LFTs.  Iron panel last month showed iron deficiency anemia.  CT abdomen pelvis with contrast on admission showed fluid filled colon probably from diarrhea and hiatal hernia.  Patient seen and examined at bedside.  She is feeling better today.  Nausea has improved.  Denies any further vomiting.  Complaining of abdominal bloating.  She usually have constipation with 1 bowel movement every 3 to 4 days.  Denies seeing any blood in the stool or black stool.    Past Medical History:  Diagnosis Date   Abnormal TSH    ADD (attention deficit disorder)    Anemia    Anxiety    Arrhythmia    Decreased libido    Depression    Heart murmur    Hyperlipidemia    Hypokalemia    Migraine    Nephrolithiasis    Pap smear abnormality of cervix/human papillomavirus (HPV) positive    Sleep difficulties    SVT (supraventricular tachycardia) (HCC)     Past Surgical History:  Procedure Laterality Date   ABLATION     Cardiac ablation, endometrial ablation   BREAST LUMPECTOMY      right breast   BREAST LUMPECTOMY Right    CARDIOVERSION     COLPOSCOPY     w/ cervical biopsy   DILITATION & CURRETTAGE/HYSTROSCOPY WITH NOVASURE ABLATION N/A 09/11/2014   Procedure: DILATATION & CURETTAGE/HYSTEROSCOPY WITH NOVASURE ABLATION;  Surgeon: Lovenia Kim, MD;  Location: [facility];  Service: Gynecology;  Laterality: N/A;    Prior to Admission medications   Medication Sig Start Date End Date Taking? Authorizing Provider  amphetamine-dextroamphetamine (ADDERALL XR) 30 MG 24 hr capsule Take 30 mg by mouth daily as needed (ADHD).   Yes [provider]  Ascorbic Acid (VITAMIN C PO) Take 1 tablet by mouth daily.   Yes [provider]  clonazePAM (KLONOPIN) 1 MG tablet Take 1 mg by mouth at bedtime. 01/20/21  Yes [provider]  doxepin (SINEQUAN) 75 MG capsule Take 75 mg by mouth at bedtime. 12/22/20  Yes [provider]  FLUoxetine (PROZAC) 20 MG capsule Take 60 mg by mouth every morning. 04/01/21  Yes [provider]  hyoscyamine (LEVSIN SL) 0.125 MG SL tablet Take 1-2 tablets by mouth daily as needed for cramping. 04/29/21  Yes [provider]  MAGNESIUM PO Take 1 tablet by mouth daily.   Yes [provider]  metoCLOPramide (REGLAN) 5 MG tablet Take 5 mg by mouth 2 (two) times daily as needed for nausea or vomiting. 04/28/21  Yes [provider]  Multiple Vitamin (MULTIVITAMIN PO) Take 1 tablet  by mouth daily.   Yes [provider]  ondansetron (ZOFRAN-ODT) 8 MG disintegrating tablet Take 8 mg by mouth every 8 (eight) hours as needed for nausea or vomiting.   Yes [provider]  pantoprazole (PROTONIX) 40 MG tablet Take 40 mg by mouth 2 (two) times daily. 04/08/21  Yes [provider]  potassium chloride SA (KLOR-CON) 20 MEQ tablet Take 2 tablets (40 mEq total) by mouth daily. Patient taking differently: Take 40 mEq by mouth 2 (two) times daily. 02/15/21  Yes Charlesetta Shanks, MD  promethazine  (PHENERGAN) 25 MG tablet Take 1 tablet (25 mg total) by mouth every 6 (six) hours as needed for nausea or vomiting. 02/15/21  Yes Pfeiffer, Jeannie Done, MD  sucralfate (CARAFATE) 1 g tablet Take 1 g by mouth 2 (two) times daily. 04/28/21  Yes [provider]  topiramate (TOPAMAX) 50 MG tablet Take 50 mg by mouth 3 (three) times daily. 01/11/21  Yes [provider]  vitamin B-12 (CYANOCOBALAMIN) 1000 MCG tablet Take 1,000 mcg by mouth daily.   Yes [provider]  VITAMIN D PO Take 2,000 Units by mouth daily.   Yes [provider]  zinc gluconate 50 MG tablet Take 50 mg by mouth daily.   Yes [provider]    Scheduled Meds:  clonazePAM  1 mg Oral QHS   FLUoxetine  20 mg Oral Daily   pantoprazole  40 mg Oral BID   potassium chloride SA  40 mEq Oral BID   sucralfate  1 g Oral BID   topiramate  50 mg Oral BID   vitamin B-12  1,000 mcg Oral Daily   Continuous Infusions: PRN Meds:.acetaminophen **OR** acetaminophen, HYDROcodone-acetaminophen, hyoscyamine, ondansetron, polyethylene glycol, promethazine  Allergies as of 05/13/2021 - Review Complete 05/13/2021  Allergen Reaction Noted   Amoxicillin Rash 02/24/2014   Bactrim [sulfamethoxazole-trimethoprim] Rash 11/13/2015   Zithromax [azithromycin] Rash 02/26/2019    Family History  Problem Relation Age of Onset   Hypothyroidism Mother    Depression Mother    Cancer Mother        thyroid   Thyroid disease Mother    Hypertension Father    Heart attack Father        Age 45   AAA (abdominal aortic aneurysm) Father    Hypothyroidism Sister    Hyperthyroidism Sister    Diabetes Maternal Grandfather    Breast cancer Paternal Grandmother     Social History   Socioeconomic History   Marital status: Legally Separated    Spouse name: Not on file   Number of children: 2   Years of education: college   Highest education level: Not on file  Occupational History   Occupation: Therapist, sports with Armed forces technical officer: [employer]   Tobacco Use   Smoking status: Never    Passive exposure: Current   Smokeless tobacco: Never  Vaping Use   Vaping Use: Never used  Substance and Sexual Activity   Alcohol use: Not Currently    Alcohol/week: 2.0 standard drinks    Types: 2 Glasses of wine per week    Comment: occasional wine   Drug use: Not Currently   Sexual activity: Yes    Partners: Male    Comment: Married  Other Topics Concern   Not on file  Social History Narrative   Lives at home with two sons.   Right-handed.   60 ounces of tea and soda per day.   Social Determinants of Health   Financial  Resource Strain: Not on file  Food Insecurity: Not on file  Transportation Needs: Not on file  Physical Activity: Not on file  Stress: Not on file  Social Connections: Not on file  Intimate Partner Violence: Not on file    Review of Systems: 12 point review of system is done which is negative except as mentioned in HPI  Physical Exam: Vital signs: Vitals:   05/14/21 2024 05/15/21 0026  BP: 118/77 (!) 82/54  Pulse: 100 72  Resp: (!) 21 14  Temp: 98.2 F (36.8 C) 97.9 F (36.6 C)  SpO2: 97% 100%   Last BM Date: 05/14/21 Physical Exam Constitutional:      General: She is not in acute distress.    Appearance: Normal appearance.  HENT:     Head: Normocephalic and atraumatic.     Nose: Nose normal.  Eyes:     Extraocular Movements: Extraocular movements intact.     Conjunctiva/sclera: Conjunctivae normal.  Cardiovascular:     Rate and Rhythm: Normal rate and regular rhythm.     Heart sounds: No murmur heard. Pulmonary:     Effort: Pulmonary effort is normal. No respiratory distress.     Breath sounds: Normal breath sounds.  Abdominal:     General: Bowel sounds are normal. There is no distension.     Palpations: Abdomen is soft.     Tenderness: There is no abdominal tenderness. There is no guarding.  Musculoskeletal:        General: No swelling. Normal range of motion.      Cervical back: Normal range of motion.     Right lower leg: No edema.     Left lower leg: No edema.  Skin:    General: Skin is warm.     Coloration: Skin is not jaundiced.  Neurological:     Mental Status: She is alert and oriented to person, place, and time.  Psychiatric:        Mood and Affect: Mood normal.        Behavior: Behavior normal.        Thought Content: Thought content normal.        Judgment: Judgment normal.     GI:  Lab Results: Recent Labs    05/13/21 1837 05/14/21 0453 05/15/21 0553  WBC 9.3 7.6 7.0  HGB 9.9* 7.3* 8.7*  HCT 33.0* 24.3* 28.9*  PLT 516* 347 340   BMET Recent Labs    05/13/21 1837 05/14/21 0453 05/15/21 0553  NA 134* 137 139  K 2.3* 2.8* 4.0  CL 93* 104 113*  CO2 '27 25 23  '$ GLUCOSE 98 99 102*  BUN '15 12 9  '$ CREATININE 1.42* 1.17* 0.78  CALCIUM 9.3 7.9* 8.2*   LFT Recent Labs    05/15/21 0553  PROT 6.1*  ALBUMIN 3.2*  AST 17  ALT 11  ALKPHOS 56  BILITOT 0.5   PT/INR No results for input(s): LABPROT, INR in the last 72 hours.   Studies/Results: No results found.  Impression/Plan: -Iron deficiency anemia.  Hemoglobin was down to 7.3.  No overt bleeding.  Recent EGD showed abnormal motility, fluid-filled esophagus, esophageal ulcers and, retained food in stomach.  Biopsies were negative. -Nausea and vomiting.  Improving -Electrolyte disturbance.  Improved -Chronic constipation.  Recommendation ------------------------ -Plan for colonoscopy tomorrow  -Okay to have clear liquid diet today.  N.p.o. past midnight. -Continue other supportive care  Risks (bleeding, infection, bowel perforation that could require surgery, sedation-related changes in cardiopulmonary systems), benefits (identification and  possible treatment of source of symptoms, exclusion of certain causes of symptoms), and alternatives (watchful waiting, radiographic imaging studies, empiric medical treatment)  were explained to patient in detail and patient  wishes to proceed.     LOS: 2 days   Otis Brace  MD, FACP 05/15/2021, 10:34 AM  Contact #  [phone]

## 2021-05-15 NOTE — H&P (View-Only) (Signed)
Referring Provider:  [facility] Primary Care Physician:  [name], MD Primary Gastroenterologist:  [facility]   Reason for Consultation: Iron deficiency anemia  HPI: Emily Park is a 50 y.o. female with past medical history of hypothyroidism, hyperlipidemia and iron deficiency anemia admitted to the hospital with abnormal labs with potassium of 2.3  He was seen in the [facility] recently for nausea, vomiting and deficiency anemia.  EGD on April 27, 2021 showed fluid in esophagus, abnormal esophageal motility, esophageal ulcers, large hiatal hernia, retained food in the stomach.  Biopsies were negative for H. pylori and celiac disease.  Esophageal biopsy showed ulcer from reflux.  Gastric emptying scan was recommended.  We were planning to have colonoscopy done in November once her nausea and vomiting were under control.  Blood work upon initial evaluation in the emergency room showed hemoglobin of 7.3, potassium of 2.8 , normal LFTs.  Iron panel last month showed iron deficiency anemia.  CT abdomen pelvis with contrast on admission showed fluid filled colon probably from diarrhea and hiatal hernia.  Patient seen and examined at bedside.  She is feeling better today.  Nausea has improved.  Denies any further vomiting.  Complaining of abdominal bloating.  She usually have constipation with 1 bowel movement every 3 to 4 days.  Denies seeing any blood in the stool or black stool.    Past Medical History:  Diagnosis Date   Abnormal TSH    ADD (attention deficit disorder)    Anemia    Anxiety    Arrhythmia    Decreased libido    Depression    Heart murmur    Hyperlipidemia    Hypokalemia    Migraine    Nephrolithiasis    Pap smear abnormality of cervix/human papillomavirus (HPV) positive    Sleep difficulties    SVT (supraventricular tachycardia) (HCC)     Past Surgical History:  Procedure Laterality Date   ABLATION     Cardiac ablation, endometrial ablation   BREAST LUMPECTOMY      right breast   BREAST LUMPECTOMY Right    CARDIOVERSION     COLPOSCOPY     w/ cervical biopsy   DILITATION & CURRETTAGE/HYSTROSCOPY WITH NOVASURE ABLATION N/A 09/11/2014   Procedure: DILATATION & CURETTAGE/HYSTEROSCOPY WITH NOVASURE ABLATION;  Surgeon: Lovenia Kim, MD;  Location: [facility];  Service: Gynecology;  Laterality: N/A;    Prior to Admission medications   Medication Sig Start Date End Date Taking? Authorizing Provider  amphetamine-dextroamphetamine (ADDERALL XR) 30 MG 24 hr capsule Take 30 mg by mouth daily as needed (ADHD).   Yes [provider]  Ascorbic Acid (VITAMIN C PO) Take 1 tablet by mouth daily.   Yes [provider]  clonazePAM (KLONOPIN) 1 MG tablet Take 1 mg by mouth at bedtime. 01/20/21  Yes [provider]  doxepin (SINEQUAN) 75 MG capsule Take 75 mg by mouth at bedtime. 12/22/20  Yes [provider]  FLUoxetine (PROZAC) 20 MG capsule Take 60 mg by mouth every morning. 04/01/21  Yes [provider]  hyoscyamine (LEVSIN SL) 0.125 MG SL tablet Take 1-2 tablets by mouth daily as needed for cramping. 04/29/21  Yes [provider]  MAGNESIUM PO Take 1 tablet by mouth daily.   Yes [provider]  metoCLOPramide (REGLAN) 5 MG tablet Take 5 mg by mouth 2 (two) times daily as needed for nausea or vomiting. 04/28/21  Yes [provider]  Multiple Vitamin (MULTIVITAMIN PO) Take 1 tablet  by mouth daily.   Yes [provider]  ondansetron (ZOFRAN-ODT) 8 MG disintegrating tablet Take 8 mg by mouth every 8 (eight) hours as needed for nausea or vomiting.   Yes [provider]  pantoprazole (PROTONIX) 40 MG tablet Take 40 mg by mouth 2 (two) times daily. 04/08/21  Yes [provider]  potassium chloride SA (KLOR-CON) 20 MEQ tablet Take 2 tablets (40 mEq total) by mouth daily. Patient taking differently: Take 40 mEq by mouth 2 (two) times daily. 02/15/21  Yes Charlesetta Shanks, MD  promethazine  (PHENERGAN) 25 MG tablet Take 1 tablet (25 mg total) by mouth every 6 (six) hours as needed for nausea or vomiting. 02/15/21  Yes Pfeiffer, Jeannie Done, MD  sucralfate (CARAFATE) 1 g tablet Take 1 g by mouth 2 (two) times daily. 04/28/21  Yes [provider]  topiramate (TOPAMAX) 50 MG tablet Take 50 mg by mouth 3 (three) times daily. 01/11/21  Yes [provider]  vitamin B-12 (CYANOCOBALAMIN) 1000 MCG tablet Take 1,000 mcg by mouth daily.   Yes [provider]  VITAMIN D PO Take 2,000 Units by mouth daily.   Yes [provider]  zinc gluconate 50 MG tablet Take 50 mg by mouth daily.   Yes [provider]    Scheduled Meds:  clonazePAM  1 mg Oral QHS   FLUoxetine  20 mg Oral Daily   pantoprazole  40 mg Oral BID   potassium chloride SA  40 mEq Oral BID   sucralfate  1 g Oral BID   topiramate  50 mg Oral BID   vitamin B-12  1,000 mcg Oral Daily   Continuous Infusions: PRN Meds:.acetaminophen **OR** acetaminophen, HYDROcodone-acetaminophen, hyoscyamine, ondansetron, polyethylene glycol, promethazine  Allergies as of 05/13/2021 - Review Complete 05/13/2021  Allergen Reaction Noted   Amoxicillin Rash 02/24/2014   Bactrim [sulfamethoxazole-trimethoprim] Rash 11/13/2015   Zithromax [azithromycin] Rash 02/26/2019    Family History  Problem Relation Age of Onset   Hypothyroidism Mother    Depression Mother    Cancer Mother        thyroid   Thyroid disease Mother    Hypertension Father    Heart attack Father        Age 53   AAA (abdominal aortic aneurysm) Father    Hypothyroidism Sister    Hyperthyroidism Sister    Diabetes Maternal Grandfather    Breast cancer Paternal Grandmother     Social History   Socioeconomic History   Marital status: Legally Separated    Spouse name: Not on file   Number of children: 2   Years of education: college   Highest education level: Not on file  Occupational History   Occupation: Therapist, sports with Armed forces technical officer: [employer]   Tobacco Use   Smoking status: Never    Passive exposure: Current   Smokeless tobacco: Never  Vaping Use   Vaping Use: Never used  Substance and Sexual Activity   Alcohol use: Not Currently    Alcohol/week: 2.0 standard drinks    Types: 2 Glasses of wine per week    Comment: occasional wine   Drug use: Not Currently   Sexual activity: Yes    Partners: Male    Comment: Married  Other Topics Concern   Not on file  Social History Narrative   Lives at home with two sons.   Right-handed.   60 ounces of tea and soda per day.   Social Determinants of Health   Financial  Resource Strain: Not on file  Food Insecurity: Not on file  Transportation Needs: Not on file  Physical Activity: Not on file  Stress: Not on file  Social Connections: Not on file  Intimate Partner Violence: Not on file    Review of Systems: 12 point review of system is done which is negative except as mentioned in HPI  Physical Exam: Vital signs: Vitals:   05/14/21 2024 05/15/21 0026  BP: 118/77 (!) 82/54  Pulse: 100 72  Resp: (!) 21 14  Temp: 98.2 F (36.8 C) 97.9 F (36.6 C)  SpO2: 97% 100%   Last BM Date: 05/14/21 Physical Exam Constitutional:      General: She is not in acute distress.    Appearance: Normal appearance.  HENT:     Head: Normocephalic and atraumatic.     Nose: Nose normal.  Eyes:     Extraocular Movements: Extraocular movements intact.     Conjunctiva/sclera: Conjunctivae normal.  Cardiovascular:     Rate and Rhythm: Normal rate and regular rhythm.     Heart sounds: No murmur heard. Pulmonary:     Effort: Pulmonary effort is normal. No respiratory distress.     Breath sounds: Normal breath sounds.  Abdominal:     General: Bowel sounds are normal. There is no distension.     Palpations: Abdomen is soft.     Tenderness: There is no abdominal tenderness. There is no guarding.  Musculoskeletal:        General: No swelling. Normal range of motion.      Cervical back: Normal range of motion.     Right lower leg: No edema.     Left lower leg: No edema.  Skin:    General: Skin is warm.     Coloration: Skin is not jaundiced.  Neurological:     Mental Status: She is alert and oriented to person, place, and time.  Psychiatric:        Mood and Affect: Mood normal.        Behavior: Behavior normal.        Thought Content: Thought content normal.        Judgment: Judgment normal.     GI:  Lab Results: Recent Labs    05/13/21 1837 05/14/21 0453 05/15/21 0553  WBC 9.3 7.6 7.0  HGB 9.9* 7.3* 8.7*  HCT 33.0* 24.3* 28.9*  PLT 516* 347 340   BMET Recent Labs    05/13/21 1837 05/14/21 0453 05/15/21 0553  NA 134* 137 139  K 2.3* 2.8* 4.0  CL 93* 104 113*  CO2 '27 25 23  '$ GLUCOSE 98 99 102*  BUN '15 12 9  '$ CREATININE 1.42* 1.17* 0.78  CALCIUM 9.3 7.9* 8.2*   LFT Recent Labs    05/15/21 0553  PROT 6.1*  ALBUMIN 3.2*  AST 17  ALT 11  ALKPHOS 56  BILITOT 0.5   PT/INR No results for input(s): LABPROT, INR in the last 72 hours.   Studies/Results: No results found.  Impression/Plan: -Iron deficiency anemia.  Hemoglobin was down to 7.3.  No overt bleeding.  Recent EGD showed abnormal motility, fluid-filled esophagus, esophageal ulcers and, retained food in stomach.  Biopsies were negative. -Nausea and vomiting.  Improving -Electrolyte disturbance.  Improved -Chronic constipation.  Recommendation ------------------------ -Plan for colonoscopy tomorrow  -Okay to have clear liquid diet today.  N.p.o. past midnight. -Continue other supportive care  Risks (bleeding, infection, bowel perforation that could require surgery, sedation-related changes in cardiopulmonary systems), benefits (identification and  possible treatment of source of symptoms, exclusion of certain causes of symptoms), and alternatives (watchful waiting, radiographic imaging studies, empiric medical treatment)  were explained to patient in detail and patient  wishes to proceed.     LOS: 2 days   Otis Brace  MD, FACP 05/15/2021, 10:34 AM  Contact #  [phone]

## 2021-05-15 NOTE — Progress Notes (Signed)
PROGRESS NOTE    Emily Park  [id number] DOB: 12/09/1970 DOA: 05/13/2021 PCP: [name], MD  Brief Narrative: 50 year old female with history of hiatal hernia, iron deficiency anemia, thyroid nodules, dyslipidemia, hypothyroidism, has had problems with ongoing nausea and vomiting since January 2022, she was originally admitted with hypovolemic shock in January, at that time adrenal insufficiency was also suspected but this was ruled out by endocrinologist at follow-up. -She was readmitted with nausea and vomiting, anemia in August'22, seen by GI then, recommended outpatient follow-up for EGD and colonoscopy, reportedly had an EGD done which noted hiatal hernia, scheduled for a colonoscopy in November. -She is also had severe hypokalemia for several months and takes supplemental K -Yesterday she had labs drawn at the hematologist office where she is followed for iron deficiency anemia where she was noted to have a potassium of 2.5, she was sent to the ED for work-up, repeat potassium was 2.3, repeat hemoglobin 7.3 this morning, microcytic  Assessment & Plan:  Severe hypokalemia -From GI losses, repleted -Mag level was 2.3 yesterday -Improving, potassium 4.0, continue BID dosing today, will decrease tomorrow  Chronic nausea and vomiting -Etiology remains unclear, reportedly had an EGD in August which noted hiatal hernia and Duodenal ulcer, unable to access this -Continue PPI and Carafate, Levsin PRN -Phenergan as needed -Requested gastroenterology evaluation in the setting of ongoing symptoms, severe iron deficiency anemia and need for colonoscopy -I was concerned that polypharmacy could also be contributing to some of her symptoms, she is agreeable to stop Doxepin, will cut down Topamax and Fluoxetine dose  Iron deficiency anemia -Source is unclear, patient reports previously having some menorrhagia but had an endometrial ablation, no longer has menstrual cycles -Hemoglobin 7.3  yesterday, anemia panel with iron deficiency, transfused 1 unit of PRBC, will give IV iron today --GI consult   Anxiety, depression ADHD -Home regimen of Adderall, Klonopin, doxepin, Prozac and Topamax continued -See discussion above  History of headaches -Continue Topamax, dose decreased  Thyroid nodule -Following with an endocrinologist for this, due to have additional work-up  DVT prophylaxis: Lovenox Code Status: Full code Family Communication: No family at bedside Disposition Plan:  Status is: Inpatient  Remains inpatient appropriate because:Inpatient level of care appropriate due to severity of illness  Dispo: The patient is from: Home              Anticipated d/c is to: Home              Patient currently is not medically stable to d/c.   Difficult to place patient No   Consultants:  Gastroenterology  Procedures:   Antimicrobials:    Subjective: -Feels okay today, some intermittent nausea, no vomiting this morning  Objective: Vitals:   05/14/21 1350 05/14/21 1700 05/14/21 2024 05/15/21 0026  BP: 107/78 101/68 118/77 (!) 82/54  Pulse: 71  100 72  Resp: 16  (!) 21 14  Temp: 97.9 F (36.6 C)  98.2 F (36.8 C) 97.9 F (36.6 C)  TempSrc: Oral Oral Oral Oral  SpO2: 100% 100% 97% 100%  Weight:      Height:        Intake/Output Summary (Last 24 hours) at 05/15/2021 1012 Last data filed at 05/15/2021 0439 Gross per 24 hour  Intake 1723.81 ml  Output --  Net 1723.81 ml   Filed Weights   05/13/21 1702  Weight: 59 kg    Examination:  General exam: Pleasant chronically ill-appearing female, laying in bed, AAOx3, no distress HEENT:  No JVD CVS: S1-S2, regular rate rhythm Lungs: Clear bilaterally  Abdomen: Soft, nontender, bowel sounds present Extremities: No edema Skin: No rashes on exposed skin  Psych: Appropriate mood and affect  Data Reviewed:   CBC: Recent Labs  Lab 05/13/21 1434 05/13/21 1837 05/14/21 0453 05/15/21 0553  WBC 10.1 9.3  7.6 7.0  NEUTROABS 7.6 6.6  --   --   HGB 9.8* 9.9* 7.3* 8.7*  HCT 31.3* 33.0* 24.3* 28.9*  MCV 76.2* 77.8* 79.9* 82.1  PLT 520* 516* 347 [id number]   Basic Metabolic Panel: Recent Labs  Lab 05/13/21 1434 05/13/21 1837 05/14/21 0453 05/15/21 0553  NA 136 134* 137 139  K 2.5* 2.3* 2.8* 4.0  CL 97* 93* 104 113*  CO2 '25 27 25 23  '$ GLUCOSE 112* 98 99 102*  BUN '13 15 12 9  '$ CREATININE 1.49* 1.42* 1.17* 0.78  CALCIUM 9.8 9.3 7.9* 8.2*  MG  --  2.3  --   --    GFR: Estimated Creatinine Clearance: 74.9 mL/min (by C-G formula based on SCr of 0.78 mg/dL). Liver Function Tests: Recent Labs  Lab 05/13/21 1434 05/14/21 0453 05/15/21 0553  AST '19 21 17  '$ ALT '11 10 11  '$ ALKPHOS 93 59 56  BILITOT 0.3 0.4 0.5  PROT 8.5* 6.5 6.1*  ALBUMIN 4.3 3.3* 3.2*   No results for input(s): LIPASE, AMYLASE in the last 168 hours. No results for input(s): AMMONIA in the last 168 hours. Coagulation Profile: No results for input(s): INR, PROTIME in the last 168 hours. Cardiac Enzymes: No results for input(s): CKTOTAL, CKMB, CKMBINDEX, TROPONINI in the last 168 hours. BNP (last 3 results) No results for input(s): PROBNP in the last 8760 hours. HbA1C: No results for input(s): HGBA1C in the last 72 hours. CBG: No results for input(s): GLUCAP in the last 168 hours. Lipid Profile: No results for input(s): CHOL, HDL, LDLCALC, TRIG, CHOLHDL, LDLDIRECT in the last 72 hours. Thyroid Function Tests: No results for input(s): TSH, T4TOTAL, FREET4, T3FREE, THYROIDAB in the last 72 hours. Anemia Panel: Recent Labs    05/13/21 1434  FERRITIN 10*  TIBC 383  IRON 400*  RETICCTPCT 1.5   Urine analysis:    Component Value Date/Time   COLORURINE STRAW (A) 04/13/2021 1413   APPEARANCEUR CLOUDY (A) 04/13/2021 1413   LABSPEC 1.003 (L) 04/13/2021 1413   PHURINE 6.0 04/13/2021 1413   GLUCOSEU NEGATIVE 04/13/2021 1413   HGBUR SMALL (A) 04/13/2021 1413   BILIRUBINUR NEGATIVE 04/13/2021 1413   BILIRUBINUR Neg  02/25/2014 0858   KETONESUR NEGATIVE 04/13/2021 1413   PROTEINUR NEGATIVE 04/13/2021 1413   UROBILINOGEN 0.2 02/25/2014 0858   UROBILINOGEN 0.2 05/20/2009 0310   NITRITE POSITIVE (A) 04/13/2021 1413   LEUKOCYTESUR LARGE (A) 04/13/2021 1413   Sepsis Labs: '@LABRCNTIP'$ (procalcitonin:4,lacticidven:4)  ) Recent Results (from the past 240 hour(s))  Resp Panel by RT-PCR (Flu A&B, Covid) Nasopharyngeal Swab     Status: None   Collection Time: 05/14/21 12:44 AM   Specimen: Nasopharyngeal Swab; Nasopharyngeal(NP) swabs in vial transport medium  Result Value Ref Range Status   SARS Coronavirus 2 by RT PCR NEGATIVE NEGATIVE Final    Comment: (NOTE) SARS-CoV-2 target nucleic acids are NOT DETECTED.  The SARS-CoV-2 RNA is generally detectable in upper respiratory specimens during the acute phase of infection. The lowest concentration of SARS-CoV-2 viral copies this assay can detect is 138 copies/mL. A negative result does not preclude SARS-Cov-2 infection and should not be used as the sole basis for treatment or other  patient management decisions. A negative result may occur with  improper specimen collection/handling, submission of specimen other than nasopharyngeal swab, presence of viral mutation(s) within the areas targeted by this assay, and inadequate number of viral copies(<138 copies/mL). A negative result must be combined with clinical observations, patient history, and epidemiological information. The expected result is Negative.  Fact Sheet for Patients:  EntrepreneurPulse.com.au  Fact Sheet for Healthcare Providers:  IncredibleEmployment.be  This test is no t yet approved or cleared by the [place] FDA and  has been authorized for detection and/or diagnosis of SARS-CoV-2 by FDA under an Emergency Use Authorization (EUA). This EUA will remain  in effect (meaning this test can be used) for the duration of the COVID-19 declaration under  Section 564(b)(1) of the Act, 21 U.S.C.section 360bbb-3(b)(1), unless the authorization is terminated  or revoked sooner.       Influenza A by PCR NEGATIVE NEGATIVE Final   Influenza B by PCR NEGATIVE NEGATIVE Final    Comment: (NOTE) The Xpert Xpress SARS-CoV-2/FLU/RSV plus assay is intended as an aid in the diagnosis of influenza from Nasopharyngeal swab specimens and should not be used as a sole basis for treatment. Nasal washings and aspirates are unacceptable for Xpert Xpress SARS-CoV-2/FLU/RSV testing.  Fact Sheet for Patients: EntrepreneurPulse.com.au  Fact Sheet for Healthcare Providers: IncredibleEmployment.be  This test is not yet approved or cleared by the [place] FDA and has been authorized for detection and/or diagnosis of SARS-CoV-2 by FDA under an Emergency Use Authorization (EUA). This EUA will remain in effect (meaning this test can be used) for the duration of the COVID-19 declaration under Section 564(b)(1) of the Act, 21 U.S.C. section 360bbb-3(b)(1), unless the authorization is terminated or revoked.  Performed at [facility], [place] [address], [place], [place] [address]      Scheduled Meds:  clonazePAM  1 mg Oral QHS   pantoprazole  40 mg Oral BID   potassium chloride SA  40 mEq Oral BID   sucralfate  1 g Oral BID   topiramate  50 mg Oral BID   vitamin B-12  1,000 mcg Oral Daily   Continuous Infusions:     LOS: 2 days    Time spent: 64mn  PDomenic Polite MD [facility]   05/15/2021, 10:12 AM

## 2021-05-16 ENCOUNTER — Encounter (HOSPITAL_COMMUNITY): Payer: Self-pay | Admitting: *Deleted

## 2021-05-16 ENCOUNTER — Inpatient Hospital Stay (HOSPITAL_COMMUNITY): Payer: Commercial Managed Care - PPO | Admitting: Certified Registered Nurse Anesthetist

## 2021-05-16 ENCOUNTER — Encounter (HOSPITAL_COMMUNITY)
Admission: EM | Disposition: A | Discharge status: Home / Self Care | Payer: Self-pay | Source: Home / Self Care | Attending: Internal Medicine

## 2021-05-16 ENCOUNTER — Other Ambulatory Visit: Payer: Self-pay | Admitting: Pharmacy Technician

## 2021-05-16 DIAGNOSIS — R112 Nausea with vomiting, unspecified: Secondary | ICD-10-CM

## 2021-05-16 DIAGNOSIS — N179 Acute kidney failure, unspecified: Secondary | ICD-10-CM

## 2021-05-16 HISTORY — PX: COLONOSCOPY WITH PROPOFOL: SHX5780

## 2021-05-16 HISTORY — PX: GIVENS CAPSULE STUDY: SHX5432

## 2021-05-16 LAB — COMPREHENSIVE METABOLIC PANEL
ALT: 12 U/L (ref 0–44)
AST: 21 U/L (ref 15–41)
Albumin: 3.4 g/dL — ABNORMAL LOW (ref 3.5–5.0)
Alkaline Phosphatase: 57 U/L (ref 38–126)
Anion gap: 3 — ABNORMAL LOW (ref 5–15)
BUN: <5 mg/dL — ABNORMAL LOW (ref 6–20)
CO2: 24 mmol/L (ref 22–32)
Calcium: 8.7 mg/dL — ABNORMAL LOW (ref 8.9–10.3)
Chloride: 111 mmol/L (ref 98–111)
Creatinine, Ser: 0.79 mg/dL (ref 0.44–1.00)
GFR, Estimated: >60 mL/min (ref >60)
Glucose, Bld: 99 mg/dL (ref 70–99)
Potassium: 4.5 mmol/L (ref 3.5–5.1)
Sodium: 138 mmol/L (ref 135–145)
Total Bilirubin: 0.5 mg/dL (ref 0.3–1.2)
Total Protein: 6.4 g/dL — ABNORMAL LOW (ref 6.5–8.1)

## 2021-05-16 LAB — T4, FREE: Free T4: 1.47 ng/dL — ABNORMAL HIGH (ref 0.61–1.12)

## 2021-05-16 LAB — CBC
HCT: 32 % — ABNORMAL LOW (ref 36.0–46.0)
Hemoglobin: 9.4 g/dL — ABNORMAL LOW (ref 12.0–15.0)
MCH: 24.5 pg — ABNORMAL LOW (ref 26.0–34.0)
MCHC: 29.4 g/dL — ABNORMAL LOW (ref 30.0–36.0)
MCV: 83.3 fL (ref 80.0–100.0)
Platelets: 358 10*3/uL (ref 150–400)
RBC: 3.84 MIL/uL — ABNORMAL LOW (ref 3.87–5.11)
RDW: 17.6 % — ABNORMAL HIGH (ref 11.5–15.5)
WBC: 7 10*3/uL (ref 4.0–10.5)
nRBC: 0 % (ref 0.0–0.2)

## 2021-05-16 LAB — TYPE AND SCREEN
ABO/RH(D): O POS
Antibody Screen: NEGATIVE
Unit division: 0

## 2021-05-16 LAB — BPAM RBC
Blood Product Expiration Date: 202210182359
ISSUE DATE / TIME: 202209171323
Unit Type and Rh: 5100

## 2021-05-16 LAB — TSH: TSH: 0.329 u[IU]/mL — ABNORMAL LOW (ref 0.350–4.500)

## 2021-05-16 SURGERY — IMAGING PROCEDURE, GI TRACT, INTRALUMINAL, VIA CAPSULE
Anesthesia: LOCAL

## 2021-05-16 SURGERY — COLONOSCOPY WITH PROPOFOL
Anesthesia: Monitor Anesthesia Care

## 2021-05-16 MED ORDER — SODIUM CHLORIDE 0.9 % IV BOLUS
1000.0000 mL | Freq: Once | INTRAVENOUS | Status: AC
  Administered 2021-05-16: 1000 mL via INTRAVENOUS

## 2021-05-16 MED ORDER — PROPOFOL 500 MG/50ML IV EMUL
INTRAVENOUS | Status: DC | PRN
  Administered 2021-05-16: 125 ug/kg/min via INTRAVENOUS

## 2021-05-16 MED ORDER — PROPOFOL 500 MG/50ML IV EMUL
INTRAVENOUS | Status: AC
  Filled 2021-05-16: qty 100

## 2021-05-16 MED ORDER — LACTATED RINGERS IV SOLN
INTRAVENOUS | Status: DC
  Administered 2021-05-16: 1000 mL via INTRAVENOUS

## 2021-05-16 SURGICAL SUPPLY — 22 items

## 2021-05-16 SURGICAL SUPPLY — 1 items: TOWEL COTTON PACK 4EA (MISCELLANEOUS) ×4 IMPLANT

## 2021-05-16 NOTE — Anesthesia Procedure Notes (Signed)
Procedure Name: MAC Date/Time: 05/16/2021 1:01 PM Performed by: Maxwell Caul, CRNA Pre-anesthesia Checklist: Patient identified, Emergency Drugs available, Suction available and Patient being monitored Oxygen Delivery Method: Simple face mask

## 2021-05-16 NOTE — Progress Notes (Signed)
PROGRESS NOTE  Emily Park [id number] DOB: October 26, 1970 DOA: 05/13/2021 PCP: [name], MD   LOS: 3 days   Brief Narrative / Interim history: 50 year old female with hypothyroidism, hyperlipidemia, iron deficiency anemia admitted to the hospital with hypokalemia and persistent nausea and vomiting.  She has been having intermittent nausea and vomiting that started in January of this year.  She has been seen by GI as an outpatient, and an EGD on August 31 showed fluid in the esophagus, abnormal esophageal motility, esophageal ulcer and a large hiatal hernia.  Biopsies were unrevealing, negative for H. pylori and celiac disease and duodenal ulcer biopsy showed reflux.  Subjective / 24h Interval events: No complaints this morning, she is feeling okay waiting on her colonoscopy  Assessment & Plan: Principal Problem Intractable nausea and vomiting, chronic-she has been having intermittent nausea and vomiting that started in January of this year.  She has been seen by GI as an outpatient, and an EGD on August 31 showed fluid in the esophagus, abnormal esophageal motility, esophageal ulcer and a large hiatal hernia.  Biopsies were unrevealing, negative for H. pylori and celiac disease and duodenal ulcer biopsy showed reflux. -GI consulted, plans for colonoscopy today. -She has known low cortisol values in the past and I wonder whether this plays a role.  She has seen Dr. Loanne Drilling and underwent a normal stim test in May 2022, cortisol increased from 3.0 >> 17.8 after stimulation.  Test was done in the afternoon, ACTH was 18 at that time.  She has persistent symptoms with unexplained nausea, vomiting, weight loss as well as intermittent hypotension.  I will repeat stim test for tomorrow  Active Problems Hyperthyroidism-she has a thyroid nodule, scheduled for nuclear scan.  Check TSH, free T3 and T4 currently.  Hypokalemia-repleted adequately, now normal   Iron deficiency anemia -Source is  unclear, patient reports previously having some menorrhagia but had an endometrial ablation, no longer has menstrual cycles -Seeing oncology as an outpatient.  Currently hemoglobin is stable.  She received a unit of packed red blood cells   Anxiety, depression ADHD -Continue home regimen   History of headaches -Continue Topamax, dose decreased  Scheduled Meds:  clonazePAM  1 mg Oral QHS   FLUoxetine  20 mg Oral Daily   pantoprazole  40 mg Oral BID   potassium chloride SA  40 mEq Oral BID   sucralfate  1 g Oral BID   topiramate  50 mg Oral BID   vitamin B-12  1,000 mcg Oral Daily   Continuous Infusions:  ferric gluconate (FERRLECIT) IVPB 250 mg (05/16/21 0940)   PRN Meds:.acetaminophen **OR** acetaminophen, HYDROcodone-acetaminophen, hyoscyamine, ondansetron, polyethylene glycol, promethazine  Diet Orders (From admission, onward)     Start     Ordered   05/16/21 0001  Diet NPO time specified  Diet effective midnight        05/15/21 1124            DVT prophylaxis: SCDs Start: 05/13/21 2221     Code Status: Full Code  Family Communication: No family at bedside  Status is: Inpatient  Remains inpatient appropriate because:Inpatient level of care appropriate due to severity of illness  Dispo: The patient is from: Home              Anticipated d/c is to: Home              Patient currently is not medically stable to d/c.   Difficult to place patient No  Level of care: Telemetry  Consultants:  GI  Procedures:  Colonoscopy-pending  Microbiology  None  Antimicrobials: None   Objective: Vitals:   05/15/21 0026 05/15/21 1444 05/15/21 2211 05/16/21 0529  BP: (!) 82/54 121/90 98/77 101/70  Pulse: 72 73 69 66  Resp: '14 17 17 17  '$ Temp: 97.9 F (36.6 C) 98.4 F (36.9 C) 98.1 F (36.7 C) 98 F (36.7 C)  TempSrc: Oral Oral Oral Oral  SpO2: 100% 98% 99% 99%  Weight:      Height:        Intake/Output Summary (Last 24 hours) at 05/16/2021 1138 Last  data filed at 05/16/2021 0342 Gross per 24 hour  Intake 270 ml  Output --  Net 270 ml   Filed Weights   05/13/21 1702  Weight: 59 kg    Examination:  Constitutional: NAD Eyes: no scleral icterus ENMT: Mucous membranes are moist.  Neck: normal, supple Respiratory: clear to auscultation bilaterally, no wheezing, no crackles.  Cardiovascular: Regular rate and rhythm, no murmurs / rubs / gallops. No LE edema.  Abdomen: non distended, no tenderness. Bowel sounds positive.  Musculoskeletal: no clubbing / cyanosis.  Skin: no rashes Neurologic: CN 2-12 grossly intact. Strength 5/5 in all 4.  Psychiatric: Normal judgment and insight. Alert and oriented x 3. Normal mood.    Data Reviewed: I have independently reviewed following labs and imaging studies  CBC: Recent Labs  Lab 05/13/21 1434 05/13/21 1837 05/14/21 0453 05/15/21 0553 05/16/21 0443  WBC 10.1 9.3 7.6 7.0 7.0  NEUTROABS 7.6 6.6  --   --   --   HGB 9.8* 9.9* 7.3* 8.7* 9.4*  HCT 31.3* 33.0* 24.3* 28.9* 32.0*  MCV 76.2* 77.8* 79.9* 82.1 83.3  PLT 520* 516* 347 340 [id number]   Basic Metabolic Panel: Recent Labs  Lab 05/13/21 1434 05/13/21 1837 05/14/21 0453 05/15/21 0553 05/16/21 0443  NA 136 134* 137 139 138  K 2.5* 2.3* 2.8* 4.0 4.5  CL 97* 93* 104 113* 111  CO2 '25 27 25 23 24  '$ GLUCOSE 112* 98 99 102* 99  BUN '13 15 12 9 '$ <5*  CREATININE 1.49* 1.42* 1.17* 0.78 0.79  CALCIUM 9.8 9.3 7.9* 8.2* 8.7*  MG  --  2.3  --   --   --    Liver Function Tests: Recent Labs  Lab 05/13/21 1434 05/14/21 0453 05/15/21 0553 05/16/21 0443  AST '19 21 17 21  '$ ALT '11 10 11 12  '$ ALKPHOS 93 59 56 57  BILITOT 0.3 0.4 0.5 0.5  PROT 8.5* 6.5 6.1* 6.4*  ALBUMIN 4.3 3.3* 3.2* 3.4*   Coagulation Profile: No results for input(s): INR, PROTIME in the last 168 hours. HbA1C: No results for input(s): HGBA1C in the last 72 hours. CBG: No results for input(s): GLUCAP in the last 168 hours.  Recent Results (from the past 240 hour(s))   Resp Panel by RT-PCR (Flu A&B, Covid) Nasopharyngeal Swab     Status: None   Collection Time: 05/14/21 12:44 AM   Specimen: Nasopharyngeal Swab; Nasopharyngeal(NP) swabs in vial transport medium  Result Value Ref Range Status   SARS Coronavirus 2 by RT PCR NEGATIVE NEGATIVE Final    Comment: (NOTE) SARS-CoV-2 target nucleic acids are NOT DETECTED.  The SARS-CoV-2 RNA is generally detectable in upper respiratory specimens during the acute phase of infection. The lowest concentration of SARS-CoV-2 viral copies this assay can detect is 138 copies/mL. A negative result does not preclude SARS-Cov-2 infection and should not be used as  the sole basis for treatment or other patient management decisions. A negative result may occur with  improper specimen collection/handling, submission of specimen other than nasopharyngeal swab, presence of viral mutation(s) within the areas targeted by this assay, and inadequate number of viral copies(<138 copies/mL). A negative result must be combined with clinical observations, patient history, and epidemiological information. The expected result is Negative.  Fact Sheet for Patients:  EntrepreneurPulse.com.au  Fact Sheet for Healthcare Providers:  IncredibleEmployment.be  This test is no t yet approved or cleared by the [place] FDA and  has been authorized for detection and/or diagnosis of SARS-CoV-2 by FDA under an Emergency Use Authorization (EUA). This EUA will remain  in effect (meaning this test can be used) for the duration of the COVID-19 declaration under Section 564(b)(1) of the Act, 21 U.S.C.section 360bbb-3(b)(1), unless the authorization is terminated  or revoked sooner.       Influenza A by PCR NEGATIVE NEGATIVE Final   Influenza B by PCR NEGATIVE NEGATIVE Final    Comment: (NOTE) The Xpert Xpress SARS-CoV-2/FLU/RSV plus assay is intended as an aid in the diagnosis of influenza from  Nasopharyngeal swab specimens and should not be used as a sole basis for treatment. Nasal washings and aspirates are unacceptable for Xpert Xpress SARS-CoV-2/FLU/RSV testing.  Fact Sheet for Patients: EntrepreneurPulse.com.au  Fact Sheet for Healthcare Providers: IncredibleEmployment.be  This test is not yet approved or cleared by the [place] FDA and has been authorized for detection and/or diagnosis of SARS-CoV-2 by FDA under an Emergency Use Authorization (EUA). This EUA will remain in effect (meaning this test can be used) for the duration of the COVID-19 declaration under Section 564(b)(1) of the Act, 21 U.S.C. section 360bbb-3(b)(1), unless the authorization is terminated or revoked.  Performed at [facility], [place] [address], [place], [place] [address]      Radiology Studies: No results found.   Marzetta Board, MD, PhD [facility]  Between 7 am - 7 pm I am available, please contact me via Amion (for emergencies) or Securechat (non urgent messages)  Between 7 pm - 7 am I am not available, please contact night coverage MD/APP via Amion

## 2021-05-16 NOTE — Transfer of Care (Signed)
Immediate Anesthesia Transfer of Care Note  Patient: Erna Brossard [facility]  Procedure(s) Performed: COLONOSCOPY WITH PROPOFOL  Patient Location: Endo  Anesthesia Type:MAC  Level of Consciousness: awake, alert  and oriented  Airway & Oxygen Therapy: Patient Spontanous Breathing and Patient connected to face mask oxygen  Post-op Assessment: Report given to RN and Post -op Vital signs reviewed and stable  Post vital signs: Reviewed and stable  Last Vitals:  Vitals Value Taken Time  BP    Temp    Pulse    Resp    SpO2      Last Pain:  Vitals:   05/16/21 1221  TempSrc: Oral  PainSc:          Complications: No notable events documented.

## 2021-05-16 NOTE — Brief Op Note (Signed)
Colonoscopy unrevealing for a source of anemia. See endopro note for details. Capsule endoscopy today and results will be available tomorrow. Diet per capsule endoscopy orders. Supportive care.

## 2021-05-16 NOTE — Anesthesia Postprocedure Evaluation (Signed)
Anesthesia Post Note  Patient: Emily Park [facility]  Procedure(s) Performed: COLONOSCOPY WITH PROPOFOL     Patient location during evaluation: Endoscopy Anesthesia Type: MAC Level of consciousness: awake and alert Pain management: pain level controlled Vital Signs Assessment: post-procedure vital signs reviewed and stable Respiratory status: spontaneous breathing, nonlabored ventilation, respiratory function stable and patient connected to nasal cannula oxygen Cardiovascular status: stable and blood pressure returned to baseline Postop Assessment: no apparent nausea or vomiting Anesthetic complications: no   No notable events documented.  Last Vitals:  Vitals:   05/16/21 1340 05/16/21 1350  BP: 99/62 106/63  Pulse: 67   Resp: 19   Temp: 36.7 C   SpO2: 97% 97%    Last Pain:  Vitals:   05/16/21 1350  TempSrc:   PainSc: 0-No pain                 Catalina Gravel

## 2021-05-16 NOTE — Progress Notes (Signed)
Givens capsule dropped at 1420 . Diet instructions given to patient and RN Aldona Bar. Both verbalized understanding.

## 2021-05-16 NOTE — Progress Notes (Signed)
   05/16/21 2140  Assess: MEWS Score  Temp 97.6 F (36.4 C)  BP (!) 76/51  Pulse Rate 72  Resp 18  Level of Consciousness Alert  Assess: MEWS Score  MEWS Temp 0  MEWS Systolic 2  MEWS Pulse 0  MEWS RR 0  MEWS LOC 0  MEWS Score 2  MEWS Score Color Yellow  Assess: if the MEWS score is Yellow or Red  Were vital signs taken at a resting state? Yes  Focused Assessment No change from prior assessment  Does the patient meet 2 or more of the SIRS criteria? No  MEWS guidelines implemented *See Row Information* Yes (BP lower than pts normal - provider notified and 1,08m bolus administrered per orders)  Treat  MEWS Interventions Administered scheduled meds/treatments  Pain Scale 0-10  Pain Score 0  Take Vital Signs  Increase Vital Sign Frequency  Yellow: Q 2hr X 2 then Q 4hr X 2, if remains yellow, continue Q 4hrs  Escalate  MEWS: Escalate Yellow: discuss with charge nurse/RN and consider discussing with provider and RRT  Notify: Charge Nurse/RN  Name of Charge Nurse/RN Notified Meredith, RN  Date Charge Nurse/RN Notified 05/16/21  Time Charge Nurse/RN Notified 2145  Notify: Provider  Provider Name/Title J. DOlena Heckle Date Provider Notified 05/16/21  Time Provider Notified 2141  Notification Type  (secure chat)  Notification Reason Other (Comment) (bp 76/51 - pt completely asymptomatic)  Provider response See new orders  Date of Provider Response 05/16/21  Time of Provider Response 2145  Assess: SIRS CRITERIA  SIRS Temperature  0  SIRS Pulse 0  SIRS Respirations  0  SIRS WBC 0  SIRS Score Sum  0  Pt now in Yellow MEWs d/t low BP. Pt is completely asymptomatic and all other vitals are within normal limits. Provider notified and 1,0065mbolus ordered and administered. Charge nurse notified and Yellow MEWs interventions initiated. Pt educated and instructed not to get up without assistance. Pt resting. Will continue to monitor.

## 2021-05-16 NOTE — Anesthesia Preprocedure Evaluation (Signed)
Anesthesia Evaluation  Patient identified by MRN, date of birth, ID band Patient awake    Reviewed: Allergy & Precautions, NPO status , Patient's Chart, lab work & pertinent test results  Airway Mallampati: II  TM Distance: >3 FB Neck ROM: Full    Dental  (+) Teeth Intact, Dental Advisory Given   Pulmonary neg pulmonary ROS,    Pulmonary exam normal breath sounds clear to auscultation       Cardiovascular Normal cardiovascular exam+ dysrhythmias Supra Ventricular Tachycardia  Rhythm:Regular Rate:Normal     Neuro/Psych  Headaches, PSYCHIATRIC DISORDERS Anxiety Depression    GI/Hepatic negative GI ROS, Neg liver ROS,   Endo/Other  negative endocrine ROS  Renal/GU negative Renal ROS     Musculoskeletal negative musculoskeletal ROS (+)   Abdominal   Peds  (+) ATTENTION DEFICIT DISORDER WITHOUT HYPERACTIVITY Hematology  (+) Blood dyscrasia, anemia ,   Anesthesia Other Findings Day of surgery medications reviewed with the patient.  Reproductive/Obstetrics                             Anesthesia Physical Anesthesia Plan  ASA: 2  Anesthesia Plan: MAC   Post-op Pain Management:    Induction: Intravenous  PONV Risk Score and Plan: 2 and Propofol infusion and Treatment may vary due to age or medical condition  Airway Management Planned: Nasal Cannula and Natural Airway  Additional Equipment:   Intra-op Plan:   Post-operative Plan:   Informed Consent: I have reviewed the patients History and Physical, chart, labs and discussed the procedure including the risks, benefits and alternatives for the proposed anesthesia with the patient or authorized representative who has indicated his/her understanding and acceptance.     Dental advisory given  Plan Discussed with: CRNA and Anesthesiologist  Anesthesia Plan Comments:         Anesthesia Quick Evaluation

## 2021-05-16 NOTE — Interval H&P Note (Signed)
History and Physical Interval Note:  05/16/2021 12:56 PM  Emily Park  has presented today for surgery, with the diagnosis of Iron deficiency anemia.  The various methods of treatment have been discussed with the patient and family. After consideration of risks, benefits and other options for treatment, the patient has consented to  Procedure(s): COLONOSCOPY WITH PROPOFOL (N/A) as a surgical intervention.  The patient's history has been reviewed, patient examined, no change in status, stable for surgery.  I have reviewed the patient's chart and labs.  Questions were answered to the patient's satisfaction.     Lear Ng

## 2021-05-16 NOTE — Op Note (Signed)
[facility] Patient Name: Emily Park Procedure Date: 05/16/2021 MRN: [id number] Attending MD: Lear Ng , MD Date of Birth: 1971-04-25 CSN: [id number] Age: 50 Admit Type: Inpatient Procedure:                Colonoscopy Indications:              This is the patient's first colonoscopy, Iron                            deficiency anemia Providers:                Lear Ng, MD, Dulcy Fanny, Tyrone Apple, Technician, Luan Moore, Technician,                            Virgia Land, CRNA Referring MD:             hospital team Medicines:                Propofol per Anesthesia, Monitored Anesthesia Care Complications:            No immediate complications. Estimated Blood Loss:     Estimated blood loss: none. Procedure:                Pre-Anesthesia Assessment:                           - Prior to the procedure, a History and Physical                            was performed, and patient medications and                            allergies were reviewed. The patient's tolerance of                            previous anesthesia was also reviewed. The risks                            and benefits of the procedure and the sedation                            options and risks were discussed with the patient.                            All questions were answered, and informed consent                            was obtained. Prior Anticoagulants: The patient has                            taken no previous anticoagulant or antiplatelet                            agents.  ASA Grade Assessment: II - A patient with                            mild systemic disease. After reviewing the risks                            and benefits, the patient was deemed in                            satisfactory condition to undergo the procedure.                           After obtaining informed consent, the colonoscope                             was passed under direct vision. Throughout the                            procedure, the patient's blood pressure, pulse, and                            oxygen saturations were monitored continuously. The                            [id number]-HQ190L ([id number] Olympus colonoscope was                            introduced through the anus and advanced to the the                            cecum, identified by appendiceal orifice and                            ileocecal valve. The colonoscopy was performed                            without difficulty. The patient tolerated the                            procedure well. The quality of the bowel                            preparation was fair and fair but repeated                            irrigation led to a good and adequate prep. The                            terminal ileum, ileocecal valve, appendiceal                            orifice, and rectum were photographed. Scope In: 1:10:26 PM Scope Out: 1:20:33 PM Scope Withdrawal Time: 0 hours 7 minutes 12 seconds  Total Procedure Duration: 0  hours 10 minutes 7 seconds  Findings:      The perianal and digital rectal examinations were normal.      Internal hemorrhoids were found during retroflexion. The hemorrhoids       were medium-sized and Grade I (internal hemorrhoids that do not       prolapse).      The exam was otherwise normal throughout the examined colon.      The terminal ileum appeared normal. Impression:               - Preparation of the colon was fair.                           - Internal hemorrhoids.                           - The examined portion of the ileum was normal.                           - No specimens collected. Moderate Sedation:      N/A - MAC procedure Recommendation:           - Patient has a contact number available for                            emergencies. The signs and symptoms of potential                            delayed complications were  discussed with the                            patient. Return to normal activities tomorrow.                            Written discharge instructions were provided to the                            patient.                           - High fiber diet.                           - Repeat colonoscopy in 10 years for screening                            purposes.                           - Continue present medications.                           - To visualize the small bowel, perform video                            capsule endoscopy today. Procedure Code(s):        --- Professional ---  45378, Colonoscopy, flexible; diagnostic, including                            collection of specimen(s) by brushing or washing,                            when performed (separate procedure) Diagnosis Code(s):        --- Professional ---                           D50.9, Iron deficiency anemia, unspecified                           K64.0, First degree hemorrhoids CPT copyright 2019 [employer]. All rights reserved. The codes documented in this report are preliminary and upon coder review may  be revised to meet current compliance requirements. Lear Ng, MD 05/16/2021 1:29:53 PM This report has been signed electronically. Number of Addenda: 0

## 2021-05-17 ENCOUNTER — Encounter (HOSPITAL_COMMUNITY): Payer: Self-pay

## 2021-05-17 ENCOUNTER — Ambulatory Visit (HOSPITAL_COMMUNITY)
Admission: RE | Admit: 2021-05-17 | Discharge: 2021-05-17 | Disposition: A | Discharge status: Home / Self Care | Payer: Commercial Managed Care - PPO | Source: Ambulatory Visit | Attending: Physician Assistant | Admitting: Physician Assistant

## 2021-05-17 ENCOUNTER — Encounter (HOSPITAL_COMMUNITY): Payer: Self-pay | Admitting: Internal Medicine

## 2021-05-17 LAB — T3, FREE: T3, Free: 2.6 pg/mL (ref 2.0–4.4)

## 2021-05-17 MED ORDER — LINACLOTIDE 290 MCG PO CAPS: 290.0000 ug | ORAL_CAPSULE | Freq: Every day | ORAL | 1 refills | Status: DC

## 2021-05-17 MED ORDER — SODIUM CHLORIDE 0.9 % IV SOLN: INTRAVENOUS | Status: DC

## 2021-05-17 NOTE — Progress Notes (Signed)
[facility] - Inpatient Gastroenterology Progress Note  Emily Park 50 y.o. 05-Oct-1970  CC:  Anemia, nausea and vomiting  Subjective: Patient had uneventful night other than continuing intermittent asymptomatic hypotension. Patient follows with endocrinology outpatient. Patient had colonoscopy yesterday which was inconclusive for anemia, so proceeded with capsule endoscopy. Patient has not seen capsule endoscopy, but denies worsening nausea, vomiting, abdominal pain. Not had any further bowel movements since colonoscopy yesterday. Patient is continuing regular diet.  ROS : Review of Systems  Constitutional:  Positive for malaise/fatigue. Negative for chills, fever and weight loss.  Respiratory:  Negative for shortness of breath.   Cardiovascular:  Negative for chest pain.  Gastrointestinal:  Positive for abdominal pain, constipation, nausea and vomiting. Negative for blood in stool, diarrhea, heartburn and melena.  Musculoskeletal:  Negative for falls.     Objective: Vital signs in last 24 hours: Vitals:   05/17/21 0255 05/17/21 0455  BP: (!) 88/64 (!) 92/59  Pulse: 64 (!) 59  Resp: 14 14  Temp: (!) 97.5 F (36.4 C) 98.5 F (36.9 C)  SpO2: 96% 98%    Physical Exam: Physical Exam Constitutional:      General: She is not in acute distress.    Appearance: Normal appearance.  Abdominal:     General: Abdomen is flat. Bowel sounds are normal.     Palpations: Abdomen is soft.     Tenderness: There is abdominal tenderness (diffuse). There is no guarding or rebound.  Skin:    Coloration: Skin is not jaundiced.  Neurological:     General: No focal deficit present.     Mental Status: She is alert and oriented to person, place, and time.  Psychiatric:        Mood and Affect: Mood normal.        Behavior: Behavior normal.     Lab Results: Recent Labs    05/15/21 0553 05/16/21 0443  NA 139 138  K 4.0 4.5  CL 113* 111  CO2 23 24  GLUCOSE 102* 99  BUN 9 <5*  CREATININE 0.78 0.79   CALCIUM 8.2* 8.7*   Recent Labs    05/15/21 0553 05/16/21 0443  AST 17 21  ALT 11 12  ALKPHOS 56 57  BILITOT 0.5 0.5  PROT 6.1* 6.4*  ALBUMIN 3.2* 3.4*   Recent Labs    05/15/21 0553 05/16/21 0443  WBC 7.0 7.0  HGB 8.7* 9.4*  HCT 28.9* 32.0*  MCV 82.1 83.3  PLT 340 358   No results for input(s): LABPROT, INR in the last 72 hours.  Lab Results: Results for orders placed or performed during the hospital encounter of 05/13/21 (from the past 48 hour(s))  Comprehensive metabolic panel     Status: Abnormal   Collection Time: 05/16/21  4:43 AM  Result Value Ref Range   Sodium 138 135 - 145 mmol/L   Potassium 4.5 3.5 - 5.1 mmol/L   Chloride 111 98 - 111 mmol/L   CO2 24 22 - 32 mmol/L   Glucose, Bld 99 70 - 99 mg/dL    Comment: Glucose reference range applies only to samples taken after fasting for at least 8 hours.   BUN <5 (L) 6 - 20 mg/dL   Creatinine, Ser 2.13 0.44 - 1.00 mg/dL   Calcium 8.7 (L) 8.9 - 10.3 mg/dL   Total Protein 6.4 (L) 6.5 - 8.1 g/dL   Albumin 3.4 (L) 3.5 - 5.0 g/dL   AST 21 15 - 41 U/L   ALT 12 0 -  44 U/L   Alkaline Phosphatase 57 38 - 126 U/L   Total Bilirubin 0.5 0.3 - 1.2 mg/dL   GFR, Estimated >40 >98 mL/min    Comment: (NOTE) Calculated using the CKD-EPI Creatinine Equation (2021)    Anion gap 3 (L) 5 - 15    Comment: Performed at [facility], [address], [place], [place] [address]  CBC     Status: Abnormal   Collection Time: 05/16/21  4:43 AM  Result Value Ref Range   WBC 7.0 4.0 - 10.5 K/uL   RBC 3.84 (L) 3.87 - 5.11 MIL/uL   Hemoglobin 9.4 (L) 12.0 - 15.0 g/dL   HCT 78.2 (L) 95.6 - 21.3 %   MCV 83.3 80.0 - 100.0 fL   MCH 24.5 (L) 26.0 - 34.0 pg   MCHC 29.4 (L) 30.0 - 36.0 g/dL   RDW 08.6 (H) 57.8 - 46.9 %   Platelets 358 150 - 400 K/uL   nRBC 0.0 0.0 - 0.2 %    Comment: Performed at [facility], [address], [place], [place] [address]  TSH     Status: Abnormal   Collection Time:  05/16/21  2:47 PM  Result Value Ref Range   TSH 0.329 (L) 0.350 - 4.500 uIU/mL    Comment: Performed by a 3rd Generation assay with a functional sensitivity of <=0.01 uIU/mL. Performed at [facility], [address], [place], [place] [address]   T4, free     Status: Abnormal   Collection Time: 05/16/21  2:47 PM  Result Value Ref Range   Free T4 1.47 (H) 0.61 - 1.12 ng/dL    Comment: (NOTE) Biotin ingestion may interfere with free T4 tests. If the results are inconsistent with the TSH level, previous test results, or the clinical presentation, then consider biotin interference. If needed, order repeat testing after stopping biotin. Performed at [facility], [address], [place], [place] [address]   T3, free     Status: None   Collection Time: 05/16/21  2:47 PM  Result Value Ref Range   T3, Free 2.6 2.0 - 4.4 pg/mL    Comment: (NOTE) Performed At: [facility] [address] [place], [place] [id number] Jolene Schimke MD IH:[id number]     Assessment/Plan: Anemia CT abdomen pelvis with contrast on admission showed fluid filled colon probably from diarrhea and hiatal hernia. Colonoscopy 05/16/2021 unrevealing for anemia source Capsule endoscopy pending If capsule endoscopy negative will benefit from hematology referral. HGB 9.4, pending CBC today Can continue evaluation outpatient, okay to discharge home if CBC is stable  Nausea and vomiting EGD on April 27, 2021 showed fluid in esophagus, abnormal esophageal motility, esophageal ulcers, large hiatal hernia, retained food in the stomach.  Biopsies were negative for H. pylori and celiac disease.  Esophageal biopsy showed ulcer from reflux. Gastric emptying scan still recommended outpatient Continue zofran, reglan PRN Has outpatient follow up in the office in Oct    Chayil Gantt R Kennette Cuthrell PA-C 05/17/2021, 9:20 AM  Contact #  [phone]

## 2021-05-17 NOTE — Discharge Summary (Addendum)
Physician Discharge Summary  Emily Park NFA:[id number] DOB: July 14, 1971 DOA: 05/13/2021  PCP: [name], MD  Admit date: 05/13/2021 Discharge date: 05/17/2021  Admitted From: home Disposition:  home  Recommendations for Outpatient Follow-up:  Follow up with PCP in 1-2 weeks Please obtain BMP/CBC in one week Follow-up with gastroenterology and hematology as an outpatient  Home Health: none Equipment/Devices: none  Discharge Condition: stable CODE STATUS: Full code Diet recommendation: regular  HPI: Per admitting MD, Emily Park is a 50 y.o. female with medical history significant of hiatal hernia, ulcers, SVT, HLD, thyroid nodule with depressed TSH who has had recent ongoing chronic nausea and vomiting.  She is seeing [facility] and they have diagnosed her with possible gastroparesis.  She has been on a liquid diet which has improved her nausea and vomiting significantly.  Additionally she reports being on potassium 20 mEq, 2 tablets twice a day and taking this as directed.  She went for labs at the cancer center today where she has been seen for low iron and anemia and was noted to have a potassium level of 2.5.  She was directed to the ED for further evaluation and treatment.(ED Course: In the ED patient was noted to have a potassium of 2.3 and some acute renal insufficiency with a creatinine at 1.42.  She was tolerating p.o. and was started on some IV repletion.  Hospital Course / Discharge diagnoses: Principal Problem Intractable nausea and vomiting, chronic-she has been having intermittent nausea and vomiting that started in January of this year.  She has been seen by GI as an outpatient, and an EGD on August 31 showed fluid in the esophagus, abnormal esophageal motility, esophageal ulcer and a large hiatal hernia.  Biopsies were unrevealing, negative for H. pylori and celiac disease and duodenal ulcer biopsy showed reflux. GI consulted and followed patient while  hospitalized, she underwent a colonoscopy which was unrevealing for source of anemia.  This was followed by capsule endoscopy.  She has remained stable, asymptomatic, improved and will be discharged home in stable condition and a capsule endoscopy will be finalized shortly after discharge. Of note, she has known low cortisol values in the past, when she was hospitalized in January she was suspect for Addison's disease due to hypotension and a cortisol of 4.7 and was placed on steroids.  She reports that this did not help her, and eventually saw endocrinology and underwent a normal stim test in May 2022.  She is currently seeing Dr. Delrae Rend   Active Problems Hyperthyroidism-she has a thyroid nodule, seeing Dr. Buddy Duty and apparently she is scheduled for a biopsy  Hypokalemia-repleted adequately, now normal Iron deficiency anemia -Source is unclear, patient reports previously having some menorrhagia but had an endometrial ablation, no longer has menstrual cycles. Seeing oncology as an outpatient.  Currently hemoglobin is stable.  She received a unit of packed red blood cells Anxiety, depression ADHD -Continue home regimen History of headaches -Continue Topamax, dose decreased  Sepsis ruled out   Discharge Instructions   Allergies as of 05/17/2021       Reactions   Amoxicillin Rash   Bactrim [sulfamethoxazole-trimethoprim] Rash   Rash noted to chest and back   Zithromax [azithromycin] Rash        Medication List     TAKE these medications    amphetamine-dextroamphetamine 30 MG 24 hr capsule Commonly known as: ADDERALL XR Take 30 mg by mouth daily as needed (ADHD).   clonazePAM 1 MG tablet  Commonly known as: KLONOPIN Take 1 mg by mouth at bedtime.   doxepin 75 MG capsule Commonly known as: SINEQUAN Take 75 mg by mouth at bedtime.   FLUoxetine 20 MG capsule Commonly known as: PROZAC Take 60 mg by mouth every morning.   hyoscyamine 0.125 MG SL tablet Commonly known as:  LEVSIN SL Take 1-2 tablets by mouth daily as needed for cramping.   linaclotide 290 MCG Caps capsule Commonly known as: Linzess Take 1 capsule (290 mcg total) by mouth daily before breakfast.   MAGNESIUM PO Take 1 tablet by mouth daily.   metoCLOPramide 5 MG tablet Commonly known as: REGLAN Take 5 mg by mouth 2 (two) times daily as needed for nausea or vomiting.   MULTIVITAMIN PO Take 1 tablet by mouth daily.   ondansetron 8 MG disintegrating tablet Commonly known as: ZOFRAN-ODT Take 8 mg by mouth every 8 (eight) hours as needed for nausea or vomiting.   pantoprazole 40 MG tablet Commonly known as: PROTONIX Take 40 mg by mouth 2 (two) times daily.   potassium chloride SA 20 MEQ tablet Commonly known as: KLOR-CON Take 2 tablets (40 mEq total) by mouth daily. What changed: when to take this   promethazine 25 MG tablet Commonly known as: PHENERGAN Take 1 tablet (25 mg total) by mouth every 6 (six) hours as needed for nausea or vomiting.   sucralfate 1 g tablet Commonly known as: CARAFATE Take 1 g by mouth 2 (two) times daily.   topiramate 50 MG tablet Commonly known as: TOPAMAX Take 50 mg by mouth 3 (three) times daily.   vitamin B-12 1000 MCG tablet Commonly known as: CYANOCOBALAMIN Take 1,000 mcg by mouth daily.   VITAMIN C PO Take 1 tablet by mouth daily.   VITAMIN D PO Take 2,000 Units by mouth daily.   zinc gluconate 50 MG tablet Take 50 mg by mouth daily.        Consultations: Gastroenterology  Procedures/Studies: Colonoscopy Capsule endoscopy  No results found.   Subjective: - no chest pain, shortness of breath, no abdominal pain, nausea or vomiting.   Discharge Exam: BP (!) 92/59 (BP Location: Right Arm)   Pulse (!) 59   Temp 98.5 F (36.9 C) (Oral)   Resp 14   Ht 5\' 5"  (1.651 m)   Wt 59 kg   SpO2 98%   BMI 21.64 kg/m   General: Pt is alert, awake, not in acute distress   The results of significant diagnostics from this  hospitalization (including imaging, microbiology, ancillary and laboratory) are listed below for reference.     Microbiology: Recent Results (from the past 240 hour(s))  Resp Panel by RT-PCR (Flu A&B, Covid) Nasopharyngeal Swab     Status: None   Collection Time: 05/14/21 12:44 AM   Specimen: Nasopharyngeal Swab; Nasopharyngeal(NP) swabs in vial transport medium  Result Value Ref Range Status   SARS Coronavirus 2 by RT PCR NEGATIVE NEGATIVE Final    Comment: (NOTE) SARS-CoV-2 target nucleic acids are NOT DETECTED.  The SARS-CoV-2 RNA is generally detectable in upper respiratory specimens during the acute phase of infection. The lowest concentration of SARS-CoV-2 viral copies this assay can detect is 138 copies/mL. A negative result does not preclude SARS-Cov-2 infection and should not be used as the sole basis for treatment or other patient management decisions. A negative result may occur with  improper specimen collection/handling, submission of specimen other than nasopharyngeal swab, presence of viral mutation(s) within the areas targeted by this assay, and  inadequate number of viral copies(<138 copies/mL). A negative result must be combined with clinical observations, patient history, and epidemiological information. The expected result is Negative.  Fact Sheet for Patients:  EntrepreneurPulse.com.au  Fact Sheet for Healthcare Providers:  IncredibleEmployment.be  This test is no t yet approved or cleared by the [place] FDA and  has been authorized for detection and/or diagnosis of SARS-CoV-2 by FDA under an Emergency Use Authorization (EUA). This EUA will remain  in effect (meaning this test can be used) for the duration of the COVID-19 declaration under Section 564(b)(1) of the Act, 21 U.S.C.section 360bbb-3(b)(1), unless the authorization is terminated  or revoked sooner.       Influenza A by PCR NEGATIVE NEGATIVE Final    Influenza B by PCR NEGATIVE NEGATIVE Final    Comment: (NOTE) The Xpert Xpress SARS-CoV-2/FLU/RSV plus assay is intended as an aid in the diagnosis of influenza from Nasopharyngeal swab specimens and should not be used as a sole basis for treatment. Nasal washings and aspirates are unacceptable for Xpert Xpress SARS-CoV-2/FLU/RSV testing.  Fact Sheet for Patients: EntrepreneurPulse.com.au  Fact Sheet for Healthcare Providers: IncredibleEmployment.be  This test is not yet approved or cleared by the [place] FDA and has been authorized for detection and/or diagnosis of SARS-CoV-2 by FDA under an Emergency Use Authorization (EUA). This EUA will remain in effect (meaning this test can be used) for the duration of the COVID-19 declaration under Section 564(b)(1) of the Act, 21 U.S.C. section 360bbb-3(b)(1), unless the authorization is terminated or revoked.  Performed at [facility] [place] [address], [place], [place] [address]      Labs: Basic Metabolic Panel: Recent Labs  Lab 05/13/21 1434 05/13/21 1837 05/14/21 0453 05/15/21 0553 05/16/21 0443  NA 136 134* 137 139 138  K 2.5* 2.3* 2.8* 4.0 4.5  CL 97* 93* 104 113* 111  CO2 25 27 25 23 24   GLUCOSE 112* 98 99 102* 99  BUN 13 15 12 9  <5*  CREATININE 1.49* 1.42* 1.17* 0.78 0.79  CALCIUM 9.8 9.3 7.9* 8.2* 8.7*  MG  --  2.3  --   --   --    Liver Function Tests: Recent Labs  Lab 05/13/21 1434 05/14/21 0453 05/15/21 0553 05/16/21 0443  AST 19 21 17 21   ALT 11 10 11 12   ALKPHOS 93 59 56 57  BILITOT 0.3 0.4 0.5 0.5  PROT 8.5* 6.5 6.1* 6.4*  ALBUMIN 4.3 3.3* 3.2* 3.4*   CBC: Recent Labs  Lab 05/13/21 1434 05/13/21 1837 05/14/21 0453 05/15/21 0553 05/16/21 0443  WBC 10.1 9.3 7.6 7.0 7.0  NEUTROABS 7.6 6.6  --   --   --   HGB 9.8* 9.9* 7.3* 8.7* 9.4*  HCT 31.3* 33.0* 24.3* 28.9* 32.0*  MCV 76.2* 77.8* 79.9* 82.1 83.3  PLT 520* 516* 347 340 358    CBG: No results for input(s): GLUCAP in the last 168 hours. Hgb A1c No results for input(s): HGBA1C in the last 72 hours. Lipid Profile No results for input(s): CHOL, HDL, LDLCALC, TRIG, CHOLHDL, LDLDIRECT in the last 72 hours. Thyroid function studies Recent Labs    05/16/21 1447  TSH 0.329*  T3FREE 2.6   Urinalysis    Component Value Date/Time   COLORURINE STRAW (A) 04/13/2021 1413   APPEARANCEUR CLOUDY (A) 04/13/2021 1413   LABSPEC 1.003 (L) 04/13/2021 1413   PHURINE 6.0 04/13/2021 1413   GLUCOSEU NEGATIVE 04/13/2021 1413   HGBUR SMALL (A) 04/13/2021 1413   BILIRUBINUR  NEGATIVE 04/13/2021 1413   BILIRUBINUR Neg 02/25/2014 0858   KETONESUR NEGATIVE 04/13/2021 1413   PROTEINUR NEGATIVE 04/13/2021 1413   UROBILINOGEN 0.2 02/25/2014 0858   UROBILINOGEN 0.2 05/20/2009 0310   NITRITE POSITIVE (A) 04/13/2021 1413   LEUKOCYTESUR LARGE (A) 04/13/2021 1413    FURTHER DISCHARGE INSTRUCTIONS:   Get Medicines reviewed and adjusted: Please take all your medications with you for your next visit with your Primary MD   Laboratory/radiological data: Please request your Primary MD to go over all hospital tests and procedure/radiological results at the follow up, please ask your Primary MD to get all Hospital records sent to his/her office.   In some cases, they will be blood work, cultures and biopsy results pending at the time of your discharge. Please request that your primary care M.D. goes through all the records of your hospital data and follows up on these results.   Also Note the following: If you experience worsening of your admission symptoms, develop shortness of breath, life threatening emergency, suicidal or homicidal thoughts you must seek medical attention immediately by calling 911 or calling your MD immediately  if symptoms less severe.   You must read complete instructions/literature along with all the possible adverse reactions/side effects for all the Medicines you  take and that have been prescribed to you. Take any new Medicines after you have completely understood and accpet all the possible adverse reactions/side effects.    Do not drive when taking Pain medications or sleeping medications (Benzodaizepines)   Do not take more than prescribed Pain, Sleep and Anxiety Medications. It is not advisable to combine anxiety,sleep and pain medications without talking with your primary care practitioner   Special Instructions: If you have smoked or chewed Tobacco  in the last 2 yrs please stop smoking, stop any regular Alcohol  and or any Recreational drug use.   Wear Seat belts while driving.   Please note: You were cared for by a hospitalist during your hospital stay. Once you are discharged, your primary care physician will handle any further medical issues. Please note that NO REFILLS for any discharge medications will be authorized once you are discharged, as it is imperative that you return to your primary care physician (or establish a relationship with a primary care physician if you do not have one) for your post hospital discharge needs so that they can reassess your need for medications and monitor your lab values.  Time coordinating discharge: 40 minutes  SIGNED:  Marzetta Board, MD, PhD 05/17/2021, 10:26 AM

## 2021-05-18 ENCOUNTER — Other Ambulatory Visit: Payer: Self-pay | Admitting: Physician Assistant

## 2021-05-20 ENCOUNTER — Encounter: Payer: Self-pay | Admitting: Physician Assistant

## 2021-05-24 ENCOUNTER — Other Ambulatory Visit (HOSPITAL_COMMUNITY)
Admission: RE | Admit: 2021-05-24 | Discharge: 2021-05-24 | Disposition: A | Discharge status: Home / Self Care | Payer: Commercial Managed Care - PPO | Source: Ambulatory Visit | Attending: Internal Medicine | Admitting: Internal Medicine

## 2021-05-24 ENCOUNTER — Ambulatory Visit
Admission: RE | Admit: 2021-05-24 | Discharge: 2021-05-24 | Disposition: A | Discharge status: Home / Self Care | Payer: Commercial Managed Care - PPO | Source: Ambulatory Visit | Attending: Internal Medicine | Admitting: Internal Medicine

## 2021-05-24 DIAGNOSIS — D34 Benign neoplasm of thyroid gland: Secondary | ICD-10-CM | POA: Insufficient documentation

## 2021-05-24 DIAGNOSIS — E041 Nontoxic single thyroid nodule: Secondary | ICD-10-CM | POA: Diagnosis present

## 2021-05-24 DIAGNOSIS — E042 Nontoxic multinodular goiter: Secondary | ICD-10-CM

## 2021-05-25 LAB — CYTOLOGY - NON PAP

## 2021-05-27 ENCOUNTER — Ambulatory Visit: Payer: Commercial Managed Care - PPO

## 2021-06-03 ENCOUNTER — Other Ambulatory Visit: Payer: Self-pay

## 2021-06-03 ENCOUNTER — Ambulatory Visit (INDEPENDENT_AMBULATORY_CARE_PROVIDER_SITE_OTHER): Payer: Commercial Managed Care - PPO

## 2021-06-03 VITALS — BP 119/81 | HR 81 | Temp 97.6°F | Resp 16 | Ht 64.0 in | Wt 126.0 lb

## 2021-06-03 DIAGNOSIS — D508 Other iron deficiency anemias: Secondary | ICD-10-CM | POA: Diagnosis not present

## 2021-06-03 MED ORDER — EPINEPHRINE 0.3 MG/0.3ML IJ SOAJ: 0.3000 mg | Freq: Once | INTRAMUSCULAR | Status: DC | PRN

## 2021-06-03 MED ORDER — SODIUM CHLORIDE 0.9 % IV SOLN
200.0000 mg | Freq: Once | INTRAVENOUS | Status: AC
  Administered 2021-06-03: 200 mg via INTRAVENOUS
  Filled 2021-06-03: qty 10

## 2021-06-03 MED ORDER — METHYLPREDNISOLONE SODIUM SUCC 125 MG IJ SOLR: 125.0000 mg | Freq: Once | INTRAMUSCULAR | Status: DC | PRN

## 2021-06-03 MED ORDER — DIPHENHYDRAMINE HCL 50 MG/ML IJ SOLN: 50.0000 mg | Freq: Once | INTRAMUSCULAR | Status: DC | PRN

## 2021-06-03 MED ORDER — SODIUM CHLORIDE 0.9 % IV SOLN: Freq: Once | INTRAVENOUS | Status: DC | PRN

## 2021-06-03 MED ORDER — ALBUTEROL SULFATE HFA 108 (90 BASE) MCG/ACT IN AERS: 2.0000 | INHALATION_SPRAY | Freq: Once | RESPIRATORY_TRACT | Status: DC | PRN

## 2021-06-03 MED ORDER — DIPHENHYDRAMINE HCL 25 MG PO CAPS
50.0000 mg | ORAL_CAPSULE | Freq: Once | ORAL | Status: AC
  Administered 2021-06-03: 50 mg via ORAL
  Filled 2021-06-03: qty 2

## 2021-06-03 MED ORDER — ACETAMINOPHEN 325 MG PO TABS
650.0000 mg | ORAL_TABLET | Freq: Once | ORAL | Status: AC
  Administered 2021-06-03: 650 mg via ORAL
  Filled 2021-06-03: qty 2

## 2021-06-03 MED ORDER — FAMOTIDINE IN NACL 20-0.9 MG/50ML-% IV SOLN: 20.0000 mg | Freq: Once | INTRAVENOUS | Status: DC | PRN

## 2021-06-03 NOTE — Progress Notes (Signed)
Diagnosis: Iron Deficiency Anemia  Provider:  Marshell Garfinkel, MD  Procedure: Infusion  IV Type: Peripheral, IV Location: R Forearm  Venofer (Iron Sucrose), Dose: 200 mg  Infusion Start Time: 4462  Infusion Stop Time: 1132  Post Infusion IV Care: Observation period completed and Peripheral IV Discontinued  Discharge: Condition: Good, Destination: Home . AVS provided to patient.   Performed by:  Steffany Schoenfelder, Sherlon Handing, LPN

## 2021-06-10 ENCOUNTER — Other Ambulatory Visit: Payer: Self-pay

## 2021-06-10 ENCOUNTER — Ambulatory Visit (INDEPENDENT_AMBULATORY_CARE_PROVIDER_SITE_OTHER): Payer: Commercial Managed Care - PPO

## 2021-06-10 VITALS — BP 116/82 | HR 85 | Temp 97.8°F | Resp 16 | Ht 64.75 in | Wt 133.0 lb

## 2021-06-10 DIAGNOSIS — D508 Other iron deficiency anemias: Secondary | ICD-10-CM

## 2021-06-10 MED ORDER — EPINEPHRINE 0.3 MG/0.3ML IJ SOAJ: 0.3000 mg | Freq: Once | INTRAMUSCULAR | Status: DC | PRN

## 2021-06-10 MED ORDER — DIPHENHYDRAMINE HCL 50 MG/ML IJ SOLN: 50.0000 mg | Freq: Once | INTRAMUSCULAR | Status: DC | PRN

## 2021-06-10 MED ORDER — SODIUM CHLORIDE 0.9 % IV SOLN: Freq: Once | INTRAVENOUS | Status: DC | PRN

## 2021-06-10 MED ORDER — SODIUM CHLORIDE 0.9 % IV SOLN
200.0000 mg | Freq: Once | INTRAVENOUS | Status: AC
  Administered 2021-06-10: 200 mg via INTRAVENOUS
  Filled 2021-06-10: qty 10

## 2021-06-10 MED ORDER — ACETAMINOPHEN 325 MG PO TABS
650.0000 mg | ORAL_TABLET | Freq: Once | ORAL | Status: AC
  Administered 2021-06-10: 650 mg via ORAL
  Filled 2021-06-10: qty 2

## 2021-06-10 MED ORDER — ALBUTEROL SULFATE HFA 108 (90 BASE) MCG/ACT IN AERS: 2.0000 | INHALATION_SPRAY | Freq: Once | RESPIRATORY_TRACT | Status: DC | PRN

## 2021-06-10 MED ORDER — METHYLPREDNISOLONE SODIUM SUCC 125 MG IJ SOLR: 125.0000 mg | Freq: Once | INTRAMUSCULAR | Status: DC | PRN

## 2021-06-10 MED ORDER — DIPHENHYDRAMINE HCL 25 MG PO CAPS: 50.0000 mg | ORAL_CAPSULE | Freq: Once | ORAL | Status: DC

## 2021-06-10 MED ORDER — FAMOTIDINE IN NACL 20-0.9 MG/50ML-% IV SOLN: 20.0000 mg | Freq: Once | INTRAVENOUS | Status: DC | PRN

## 2021-06-10 NOTE — Progress Notes (Signed)
Diagnosis: Iron Deficiency Anemia  Provider:  Marshell Garfinkel, MD  Procedure: Infusion  IV Type: Peripheral, IV Location: L Hand  Venofer (Iron Sucrose), Dose: 200 mg  Infusion Start Time: 8478  Infusion Stop Time: 4128  Post Infusion IV Care: Peripheral IV Discontinued  Discharge: Condition: Good, Destination: Home . AVS provided to patient.   Performed by:  Lior Hoen, Sherlon Handing, LPN

## 2021-06-17 ENCOUNTER — Ambulatory Visit (INDEPENDENT_AMBULATORY_CARE_PROVIDER_SITE_OTHER): Payer: Commercial Managed Care - PPO

## 2021-06-17 ENCOUNTER — Other Ambulatory Visit: Payer: Self-pay

## 2021-06-17 VITALS — BP 124/82 | HR 75 | Temp 97.4°F | Resp 16 | Ht 64.0 in | Wt 129.6 lb

## 2021-06-17 DIAGNOSIS — D508 Other iron deficiency anemias: Secondary | ICD-10-CM

## 2021-06-17 MED ORDER — ACETAMINOPHEN 325 MG PO TABS
650.0000 mg | ORAL_TABLET | Freq: Once | ORAL | Status: AC
  Administered 2021-06-17: 650 mg via ORAL
  Filled 2021-06-17: qty 2

## 2021-06-17 MED ORDER — DIPHENHYDRAMINE HCL 25 MG PO CAPS: 50.0000 mg | ORAL_CAPSULE | Freq: Once | ORAL | Status: DC

## 2021-06-17 MED ORDER — SODIUM CHLORIDE 0.9 % IV SOLN
200.0000 mg | Freq: Once | INTRAVENOUS | Status: AC
  Administered 2021-06-17: 200 mg via INTRAVENOUS
  Filled 2021-06-17: qty 10

## 2021-06-17 MED ORDER — SODIUM CHLORIDE 0.9 % IV SOLN: Freq: Once | INTRAVENOUS | Status: DC | PRN

## 2021-06-17 MED ORDER — ALBUTEROL SULFATE HFA 108 (90 BASE) MCG/ACT IN AERS: 2.0000 | INHALATION_SPRAY | Freq: Once | RESPIRATORY_TRACT | Status: DC | PRN

## 2021-06-17 MED ORDER — DIPHENHYDRAMINE HCL 50 MG/ML IJ SOLN: 50.0000 mg | Freq: Once | INTRAMUSCULAR | Status: DC | PRN

## 2021-06-17 MED ORDER — FAMOTIDINE IN NACL 20-0.9 MG/50ML-% IV SOLN: 20.0000 mg | Freq: Once | INTRAVENOUS | Status: DC | PRN

## 2021-06-17 MED ORDER — METHYLPREDNISOLONE SODIUM SUCC 125 MG IJ SOLR: 125.0000 mg | Freq: Once | INTRAMUSCULAR | Status: DC | PRN

## 2021-06-17 MED ORDER — EPINEPHRINE 0.3 MG/0.3ML IJ SOAJ: 0.3000 mg | Freq: Once | INTRAMUSCULAR | Status: DC | PRN

## 2021-06-17 NOTE — Progress Notes (Signed)
Diagnosis: Iron Deficiency Anemia  Provider:  Marshell Garfinkel, MD  Procedure: Infusion  IV Type: Peripheral, IV Location: L Forearm  Venofer (Iron Sucrose), Dose: 200 mg  Infusion Start Time: 10.40 06/17/2021  Infusion Stop Time: 11.00 06/17/2021  Post Infusion IV Care: Peripheral IV Discontinued  Discharge: Condition: Good, Destination: Home . AVS provided to patient.   Performed by:  Arnoldo Morale, RN

## 2021-06-24 ENCOUNTER — Ambulatory Visit: Payer: Commercial Managed Care - PPO

## 2021-06-24 MED ORDER — SODIUM CHLORIDE 0.9 % IV SOLN
200.0000 mg | Freq: Once | INTRAVENOUS | Status: DC
  Filled 2021-06-24: qty 10

## 2021-06-24 MED ORDER — DIPHENHYDRAMINE HCL 25 MG PO CAPS: 50.0000 mg | ORAL_CAPSULE | Freq: Once | ORAL | Status: DC

## 2021-06-24 MED ORDER — ACETAMINOPHEN 325 MG PO TABS: 650.0000 mg | ORAL_TABLET | Freq: Once | ORAL | Status: DC

## 2021-06-28 ENCOUNTER — Encounter: Payer: Self-pay | Admitting: Physician Assistant

## 2021-07-01 ENCOUNTER — Other Ambulatory Visit: Payer: Self-pay

## 2021-07-01 ENCOUNTER — Ambulatory Visit: Payer: Commercial Managed Care - PPO

## 2021-07-01 ENCOUNTER — Ambulatory Visit
Admission: EM | Admit: 2021-07-01 | Discharge: 2021-07-01 | Disposition: A | Discharge status: Home / Self Care | Payer: Commercial Managed Care - PPO | Attending: Emergency Medicine | Admitting: Emergency Medicine

## 2021-07-01 DIAGNOSIS — B349 Viral infection, unspecified: Secondary | ICD-10-CM | POA: Diagnosis not present

## 2021-07-01 DIAGNOSIS — R509 Fever, unspecified: Secondary | ICD-10-CM

## 2021-07-01 DIAGNOSIS — Z20828 Contact with and (suspected) exposure to other viral communicable diseases: Secondary | ICD-10-CM

## 2021-07-01 DIAGNOSIS — G43919 Migraine, unspecified, intractable, without status migrainosus: Secondary | ICD-10-CM

## 2021-07-01 DIAGNOSIS — R112 Nausea with vomiting, unspecified: Secondary | ICD-10-CM

## 2021-07-01 LAB — POCT INFLUENZA A/B
Influenza A, POC: NEGATIVE
Influenza B, POC: NEGATIVE

## 2021-07-01 MED ORDER — METOCLOPRAMIDE HCL 5 MG/ML IJ SOLN
10.0000 mg | Freq: Once | INTRAMUSCULAR | Status: AC
  Administered 2021-07-01: 10 mg via INTRAMUSCULAR

## 2021-07-01 MED ORDER — KETOROLAC TROMETHAMINE 60 MG/2ML IM SOLN
60.0000 mg | Freq: Once | INTRAMUSCULAR | Status: AC
  Administered 2021-07-01: 60 mg via INTRAMUSCULAR

## 2021-07-01 NOTE — ED Triage Notes (Signed)
Pt reports N/V/D, headache, dizziness and cough. At home Covid test was negative. Started: 2 days ago

## 2021-07-01 NOTE — ED Provider Notes (Signed)
[facility]    CSN: [id number] Arrival date & time: 07/01/21  1239      History   Chief Complaint No chief complaint on file.   HPI Emily Park is a 50 y.o. female.   Patient complains of nausea, vomiting and diarrhea, headache, dizziness and cough.  Patient states she felt so bad this morning she took an Adderall to give her enough energy to get out of bed this morning.  Patient states she was post to have an iron infusion today, states she felt so bad she did not go.  Patient states she also has a very bad migraine headache, states she took tramadol and ibuprofen for this earlier with little benefit, as I enter the room, patient is sitting in the exam chair with the lights off holding the side of her head.  PDMP reviewed, patient currently receives regular prescriptions for Adderall and clonazepam from her provider.  I do not see that she has any prescriptions for tramadol in the last 2 years.  The history is provided by the patient.   Past Medical History:  Diagnosis Date   Abnormal TSH    ADD (attention deficit disorder)    Anemia    Anxiety    Arrhythmia    Decreased libido    Depression    Heart murmur    Hyperlipidemia    Hypokalemia    Migraine    Nephrolithiasis    Pap smear abnormality of cervix/human papillomavirus (HPV) positive    Sleep difficulties    SVT (supraventricular tachycardia) (HCC)     Patient Active Problem List   Diagnosis Date Noted   IDA (iron deficiency anemia) 05/13/2021   Hypokalemia 05/13/2021   Diarrhea 04/14/2021   Intractable vomiting with nausea 04/14/2021   Lower urinary tract infectious disease 04/14/2021   Sepsis ([place]) 04/13/2021   Atypical squamous cells of undetermined significance on cytologic smear of cervix (ASC-US) 02/24/2021   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   Anxiety 02/24/2021   Heart murmur 02/24/2021   Thyroid nodule 10/26/2020   AKI (acute kidney injury) ([place])    Hypotension    Hypovolemic shock ([place])  08/29/2020   Rectal prolapse 04/05/2020   Migraine 02/27/2019   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/25/2018   Abnormal thyroid stimulating hormone (TSH) level 10/24/2018   Syncope and collapse 10/10/2018   Lung nodules 11/14/2015   Paroxysmal SVT (supraventricular tachycardia) ([place]) 11/13/2015   Attention deficit hyperactivity disorder (ADHD) 11/13/2015    Past Surgical History:  Procedure Laterality Date   ABLATION     Cardiac ablation, endometrial ablation   BREAST LUMPECTOMY     right breast   BREAST LUMPECTOMY Right    CARDIOVERSION     COLONOSCOPY WITH PROPOFOL N/A 05/16/2021   Procedure: COLONOSCOPY WITH PROPOFOL;  Surgeon: Wilford Corner, MD;  Location: [facility];  Service: Endoscopy;  Laterality: N/A;   COLPOSCOPY     w/ cervical biopsy   DILITATION & CURRETTAGE/HYSTROSCOPY WITH NOVASURE ABLATION N/A 09/11/2014   Procedure: DILATATION & CURETTAGE/HYSTEROSCOPY WITH NOVASURE ABLATION;  Surgeon: Lovenia Kim, MD;  Location: [facility];  Service: Gynecology;  Laterality: N/A;   GIVENS CAPSULE STUDY N/A 05/16/2021   Procedure: GIVENS CAPSULE STUDY;  Surgeon: Wilford Corner, MD;  Location: [facility];  Service: Endoscopy;  Laterality: N/A;    OB History     Gravida      Para      Term      Preterm  AB      Living  2      SAB      IAB      Ectopic      Multiple      Live Births               Home Medications    Prior to Admission medications   Medication Sig Start Date End Date Taking? Authorizing Provider  amphetamine-dextroamphetamine (ADDERALL XR) 30 MG 24 hr capsule Take 30 mg by mouth daily as needed (ADHD).    [provider]  Ascorbic Acid (VITAMIN C PO) Take 1 tablet by mouth daily.    [provider]  clonazePAM (KLONOPIN) 1 MG tablet Take 1 mg by mouth at bedtime. 01/20/21   [provider]  doxepin (SINEQUAN) 75 MG capsule Take 75 mg by mouth at bedtime. 12/22/20   [provider]  FLUoxetine (PROZAC) 20 MG capsule Take 60 mg by mouth every morning. 04/01/21   [provider]  hyoscyamine (LEVSIN SL) 0.125 MG SL tablet Take 1-2 tablets by mouth daily as needed for cramping. 04/29/21   [provider]  linaclotide Rolan Lipa) 290 MCG CAPS capsule Take 1 capsule (290 mcg total) by mouth daily before breakfast. 05/17/21   Caren Griffins, MD  MAGNESIUM PO Take 1 tablet by mouth daily.    [provider]  metoCLOPramide (REGLAN) 5 MG tablet Take 5 mg by mouth 2 (two) times daily as needed for nausea or vomiting. 04/28/21   [provider]  Multiple Vitamin (MULTIVITAMIN PO) Take 1 tablet by mouth daily.    [provider]  ondansetron (ZOFRAN-ODT) 8 MG disintegrating tablet Take 8 mg by mouth every 8 (eight) hours as needed for nausea or vomiting.    [provider]  pantoprazole (PROTONIX) 40 MG tablet Take 40 mg by mouth 2 (two) times daily. 04/08/21   [provider]  potassium chloride SA (KLOR-CON) 20 MEQ tablet Take 2 tablets (40 mEq total) by mouth daily. Patient taking differently: Take 40 mEq by mouth 2 (two) times daily. 02/15/21   Charlesetta Shanks, MD  promethazine (PHENERGAN) 25 MG tablet Take 1 tablet (25 mg total) by mouth every 6 (six) hours as needed for nausea or vomiting. 02/15/21   Charlesetta Shanks, MD  sucralfate (CARAFATE) 1 g tablet Take 1 g by mouth 2 (two) times daily. 04/28/21   [provider]  topiramate (TOPAMAX) 50 MG tablet Take 50 mg by mouth 3 (three) times daily. 01/11/21   [provider]  vitamin B-12 (CYANOCOBALAMIN) 1000 MCG tablet Take 1,000 mcg by mouth daily.    [provider]  VITAMIN D PO Take 2,000 Units by mouth daily.    [provider]  zinc gluconate 50 MG tablet Take 50 mg by mouth daily.    [provider]    Family History Family History  Problem Relation Age of Onset   Hypothyroidism Mother    Depression Mother     Cancer Mother        thyroid   Thyroid disease Mother    Hypertension Father    Heart attack Father        Age 35   AAA (abdominal aortic aneurysm) Father    Hypothyroidism Sister    Hyperthyroidism Sister    Diabetes Maternal Grandfather    Breast cancer Paternal Grandmother     Social History Social History   Tobacco Use   Smoking status: Never  Passive exposure: Current   Smokeless tobacco: Never  Vaping Use   Vaping Use: Never used  Substance Use Topics   Alcohol use: Not Currently    Alcohol/week: 2.0 standard drinks    Types: 2 Glasses of wine per week    Comment: occasional wine   Drug use: Not Currently     Allergies   Amoxicillin, Bactrim [sulfamethoxazole-trimethoprim], and Zithromax [azithromycin]   Review of Systems Review of Systems Pertinent findings noted in history of present illness.    Physical Exam Triage Vital Signs ED Triage Vitals  Enc Vitals Group     BP 06/24/21 0827 (!) 147/82     Pulse Rate 06/24/21 0827 72     Resp 06/24/21 0827 18     Temp 06/24/21 0827 98.3 F (36.8 C)     Temp Source 06/24/21 0827 Oral     SpO2 06/24/21 0827 98 %     Weight --      Height --      Head Circumference --      Peak Flow --      Pain Score 06/24/21 0826 5     Pain Loc --      Pain Edu? --      Excl. in [place]? --    No data found.  Updated Vital Signs BP 128/82 (BP Location: Left Arm)   Pulse (!) 120   Temp 98.2 F (36.8 C) (Oral)   Resp 20   SpO2 96%   Visual Acuity Right Eye Distance:   Left Eye Distance:   Bilateral Distance:    Right Eye Near:   Left Eye Near:    Bilateral Near:     Physical Exam Vitals and nursing note reviewed.  Constitutional:      General: She is not in acute distress.    Appearance: Normal appearance. She is not ill-appearing.  HENT:     Head: Normocephalic and atraumatic.     Salivary Glands: Right salivary gland is not diffusely enlarged or tender. Left salivary gland is not diffusely enlarged or  tender.     Right Ear: Tympanic membrane, ear canal and external ear normal. No drainage. No middle ear effusion. There is no impacted cerumen. Tympanic membrane is not erythematous or bulging.     Left Ear: Tympanic membrane, ear canal and external ear normal. No drainage.  No middle ear effusion. There is no impacted cerumen. Tympanic membrane is not erythematous or bulging.     Nose: Nose normal. No nasal deformity, septal deviation, mucosal edema, congestion or rhinorrhea.     Right Turbinates: Not enlarged, swollen or pale.     Left Turbinates: Not enlarged, swollen or pale.     Right Sinus: No maxillary sinus tenderness or frontal sinus tenderness.     Left Sinus: No maxillary sinus tenderness or frontal sinus tenderness.     Mouth/Throat:     Lips: Pink. No lesions.     Mouth: Mucous membranes are moist. No oral lesions.     Pharynx: Oropharynx is clear. Uvula midline. No posterior oropharyngeal erythema or uvula swelling.     Tonsils: No tonsillar exudate. 0 on the right. 0 on the left.  Eyes:     General: Lids are normal.        Right eye: No discharge.        Left eye: No discharge.     Extraocular Movements: Extraocular movements intact.     Conjunctiva/sclera: Conjunctivae normal.     Right  eye: Right conjunctiva is not injected.     Left eye: Left conjunctiva is not injected.  Neck:     Trachea: Trachea and phonation normal.  Cardiovascular:     Rate and Rhythm: Normal rate and regular rhythm.     Pulses: Normal pulses.     Heart sounds: Normal heart sounds. No murmur heard.   No friction rub. No gallop.  Pulmonary:     Effort: Pulmonary effort is normal. No accessory muscle usage, prolonged expiration or respiratory distress.     Breath sounds: Normal breath sounds. No stridor, decreased air movement or transmitted upper airway sounds. No decreased breath sounds, wheezing, rhonchi or rales.  Chest:     Chest wall: No tenderness.  Abdominal:     General: Abdomen is  flat. Bowel sounds are normal. There is no distension.     Palpations: Abdomen is soft.     Tenderness: There is no abdominal tenderness. There is no right CVA tenderness or left CVA tenderness.     Hernia: No hernia is present.  Musculoskeletal:        General: Normal range of motion.     Cervical back: Normal range of motion and neck supple. Normal range of motion.  Lymphadenopathy:     Cervical: No cervical adenopathy.  Skin:    General: Skin is warm and dry.     Findings: No erythema or rash.  Neurological:     General: No focal deficit present.     Mental Status: She is alert and oriented to person, place, and time.  Psychiatric:        Mood and Affect: Mood normal.        Behavior: Behavior normal.     UC Treatments / Results  Labs (all labs ordered are listed, but only abnormal results are displayed) Labs Reviewed  COVID-19, FLU A+B AND RSV  POCT INFLUENZA A/B    EKG   Radiology No results found.  Procedures Procedures (including critical care time)  Medications Ordered in UC Medications  ketorolac (TORADOL) injection 60 mg (60 mg Intramuscular Given 07/01/21 1536)  metoCLOPramide (REGLAN) injection 10 mg (10 mg Intramuscular Given 07/01/21 1536)    Initial Impression / Assessment and Plan / UC Course  I have reviewed the triage vital signs and the nursing notes.  Pertinent labs & imaging results that were available during my care of the patient were reviewed by me and considered in my medical decision making (see chart for details).     Patient has a mildly elevated temperature with a heart rate of 120, otherwise physical exam is unremarkable.  Patient was provided with an injection of ketorolac and metoclopramide for both her severe headache and nausea.  Rapid flu test today is negative.  At end of visit patient states she did have relief of her headache with the injections of ketorolac and metoclopramide.  Patient requested prescriptions for Phenergan  suppositories and Toradol prior to discharge, patient was advised she will need to follow-up with her primary care physician for these prescriptions.  Patient verbalized understanding and agreement of plan as discussed.  All questions were addressed during visit.  Please see discharge instructions below for further details of plan.  Final Clinical Impressions(s) / UC Diagnoses   Final diagnoses:  Viral illness  Intractable migraine without status migrainosus, unspecified migraine type  Nausea and vomiting, unspecified vomiting type  Exposure to respiratory syncytial virus (RSV)  Fever, unspecified     Discharge Instructions  Your symptoms are most consistent with a viral upper respiratory illness.  Unfortunately your rapid flu test today was negative.  The second swab was for testing for COVID and RSV, this test takes 24 to 48 hours to be complete, you will be contacted with results and the result will also be posted to your MyChart account.    In the meantime, conservative care is recommended.  This includes rest, pushing clear fluids and activity as tolerated.  You may also noticed that your appetite is reduced, this is okay as long as you are drinking plenty of clear fluids.  Acetaminophen (Tylenol): This is a good fever reducer.  If your body temperature rises above 101.5 as measured with a thermometer, it is recommended that you take 1,000 mg every 6-8 hours until your temperature falls below 101.5, please not take more than 3,000 mg of acetaminophen either as a separate medication or as in ingredient in an over-the-counter cold/flu preparation within a 24-hour period  Ibuprofen  (Advil, Motrin): This is a good anti-inflammatory medication which addresses aches and pains and, to some degree, congestion in the nasal passages.  I recommend taking between 400 to 600 mg every 6-8 hours as needed.  Pseudoephedrine (Sudafed): This is a decongestant.  This medication has to be purchased  from the pharmacist counter, I recommend taking 2 tablets, 60 mg, 2-3 times a day as needed to relieve runny nose and sinus drainage.  Guaifenesin (Robitussin, Mucinex): This is an expectorant.  This helps break up chest congestion and loosen up thick nasal drainage making phlegm and drainage more liquid and therefore easier to remove.  I recommend taking 400 mg three times daily as needed.  Dextromethorphan (any cough medicine with the letters "DM" added to it's name such as Robitussin DM): This is a cough suppressant.  This is often recommended to be taken at nighttime to suppress cough and help people sleep.  Take as directed on the bottle.   Based on my physical exam findings and the history you provided  today, I do not see any evidence of bacterial infection therefore treatment with antibiotics would be of no benefit.      ED Prescriptions   None    PDMP not reviewed this encounter.    Lynden Oxford Scales, PA-C 07/01/21 1654

## 2021-07-01 NOTE — Discharge Instructions (Addendum)
Your symptoms are most consistent with a viral upper respiratory illness.  Unfortunately your rapid flu test today was negative.  The second swab was for testing for COVID and RSV, this test takes 24 to 48 hours to be complete, you will be contacted with results and the result will also be posted to your MyChart account.    In the meantime, conservative care is recommended.  This includes rest, pushing clear fluids and activity as tolerated.  You may also noticed that your appetite is reduced, this is okay as long as you are drinking plenty of clear fluids.  Acetaminophen (Tylenol): This is a good fever reducer.  If your body temperature rises above 101.5 as measured with a thermometer, it is recommended that you take 1,000 mg every 6-8 hours until your temperature falls below 101.5, please not take more than 3,000 mg of acetaminophen either as a separate medication or as in ingredient in an over-the-counter cold/flu preparation within a 24-hour period  Ibuprofen  (Advil, Motrin): This is a good anti-inflammatory medication which addresses aches and pains and, to some degree, congestion in the nasal passages.  I recommend taking between 400 to 600 mg every 6-8 hours as needed.  Pseudoephedrine (Sudafed): This is a decongestant.  This medication has to be purchased from the pharmacist counter, I recommend taking 2 tablets, 60 mg, 2-3 times a day as needed to relieve runny nose and sinus drainage.  Guaifenesin (Robitussin, Mucinex): This is an expectorant.  This helps break up chest congestion and loosen up thick nasal drainage making phlegm and drainage more liquid and therefore easier to remove.  I recommend taking 400 mg three times daily as needed.  Dextromethorphan (any cough medicine with the letters "DM" added to it's name such as Robitussin DM): This is a cough suppressant.  This is often recommended to be taken at nighttime to suppress cough and help people sleep.  Take as directed on the bottle.    Based on my physical exam findings and the history you provided  today, I do not see any evidence of bacterial infection therefore treatment with antibiotics would be of no benefit.

## 2021-07-02 LAB — COVID-19, FLU A+B AND RSV
Influenza A, NAA: NOT DETECTED
Influenza B, NAA: NOT DETECTED
RSV, NAA: NOT DETECTED
SARS-CoV-2, NAA: NOT DETECTED

## 2021-07-04 ENCOUNTER — Inpatient Hospital Stay (HOSPITAL_COMMUNITY)
Admission: EM | Admit: 2021-07-04 | Discharge: 2021-07-10 | DRG: 329 | Disposition: A | Discharge status: Home / Self Care | Payer: Commercial Managed Care - PPO | Attending: General Surgery | Admitting: General Surgery

## 2021-07-04 DIAGNOSIS — Z8249 Family history of ischemic heart disease and other diseases of the circulatory system: Secondary | ICD-10-CM

## 2021-07-04 DIAGNOSIS — Z8349 Family history of other endocrine, nutritional and metabolic diseases: Secondary | ICD-10-CM

## 2021-07-04 DIAGNOSIS — Z882 Allergy status to sulfonamides status: Secondary | ICD-10-CM

## 2021-07-04 DIAGNOSIS — Z79899 Other long term (current) drug therapy: Secondary | ICD-10-CM

## 2021-07-04 DIAGNOSIS — J1282 Pneumonia due to coronavirus disease 2019: Secondary | ICD-10-CM | POA: Diagnosis present

## 2021-07-04 DIAGNOSIS — F32A Depression, unspecified: Secondary | ICD-10-CM | POA: Diagnosis present

## 2021-07-04 DIAGNOSIS — K219 Gastro-esophageal reflux disease without esophagitis: Secondary | ICD-10-CM | POA: Diagnosis present

## 2021-07-04 DIAGNOSIS — Z833 Family history of diabetes mellitus: Secondary | ICD-10-CM

## 2021-07-04 DIAGNOSIS — Z91138 Patient's unintentional underdosing of medication regimen for other reason: Secondary | ICD-10-CM

## 2021-07-04 DIAGNOSIS — K254 Chronic or unspecified gastric ulcer with hemorrhage: Secondary | ICD-10-CM | POA: Diagnosis present

## 2021-07-04 DIAGNOSIS — K3184 Gastroparesis: Secondary | ICD-10-CM | POA: Diagnosis present

## 2021-07-04 DIAGNOSIS — K449 Diaphragmatic hernia without obstruction or gangrene: Secondary | ICD-10-CM | POA: Diagnosis present

## 2021-07-04 DIAGNOSIS — K623 Rectal prolapse: Principal | ICD-10-CM

## 2021-07-04 DIAGNOSIS — Q438 Other specified congenital malformations of intestine: Secondary | ICD-10-CM

## 2021-07-04 DIAGNOSIS — R0902 Hypoxemia: Secondary | ICD-10-CM | POA: Diagnosis present

## 2021-07-04 DIAGNOSIS — K581 Irritable bowel syndrome with constipation: Secondary | ICD-10-CM | POA: Diagnosis present

## 2021-07-04 DIAGNOSIS — U071 COVID-19: Secondary | ICD-10-CM

## 2021-07-04 DIAGNOSIS — Z8679 Personal history of other diseases of the circulatory system: Secondary | ICD-10-CM

## 2021-07-04 DIAGNOSIS — Z87442 Personal history of urinary calculi: Secondary | ICD-10-CM

## 2021-07-04 DIAGNOSIS — R059 Cough, unspecified: Secondary | ICD-10-CM

## 2021-07-04 DIAGNOSIS — Z8719 Personal history of other diseases of the digestive system: Secondary | ICD-10-CM

## 2021-07-04 DIAGNOSIS — D72819 Decreased white blood cell count, unspecified: Secondary | ICD-10-CM | POA: Diagnosis present

## 2021-07-04 DIAGNOSIS — Z803 Family history of malignant neoplasm of breast: Secondary | ICD-10-CM

## 2021-07-04 DIAGNOSIS — F909 Attention-deficit hyperactivity disorder, unspecified type: Secondary | ICD-10-CM | POA: Diagnosis present

## 2021-07-04 DIAGNOSIS — F419 Anxiety disorder, unspecified: Secondary | ICD-10-CM | POA: Diagnosis present

## 2021-07-04 DIAGNOSIS — T50996A Underdosing of other drugs, medicaments and biological substances, initial encounter: Secondary | ICD-10-CM | POA: Diagnosis present

## 2021-07-04 DIAGNOSIS — E876 Hypokalemia: Secondary | ICD-10-CM | POA: Diagnosis present

## 2021-07-04 DIAGNOSIS — Z880 Allergy status to penicillin: Secondary | ICD-10-CM

## 2021-07-04 DIAGNOSIS — Z881 Allergy status to other antibiotic agents status: Secondary | ICD-10-CM

## 2021-07-04 DIAGNOSIS — R1310 Dysphagia, unspecified: Secondary | ICD-10-CM | POA: Diagnosis present

## 2021-07-04 LAB — CBC WITH DIFFERENTIAL/PLATELET
Abs Immature Granulocytes: 0.05 10*3/uL (ref 0.00–0.07)
Basophils Absolute: 0 10*3/uL (ref 0.0–0.1)
Basophils Relative: 0 %
Eosinophils Absolute: 0.5 10*3/uL (ref 0.0–0.5)
Eosinophils Relative: 4 %
HCT: 33.5 % — ABNORMAL LOW (ref 36.0–46.0)
Hemoglobin: 10.9 g/dL — ABNORMAL LOW (ref 12.0–15.0)
Immature Granulocytes: 0 %
Lymphocytes Relative: 21 %
Lymphs Abs: 2.4 10*3/uL (ref 0.7–4.0)
MCH: 28.8 pg (ref 26.0–34.0)
MCHC: 32.5 g/dL (ref 30.0–36.0)
MCV: 88.4 fL (ref 80.0–100.0)
Monocytes Absolute: 0.6 10*3/uL (ref 0.1–1.0)
Monocytes Relative: 5 %
Neutro Abs: 7.8 10*3/uL — ABNORMAL HIGH (ref 1.7–7.7)
Neutrophils Relative %: 70 %
Platelets: 321 10*3/uL (ref 150–400)
RBC: 3.79 MIL/uL — ABNORMAL LOW (ref 3.87–5.11)
RDW: 21.8 % — ABNORMAL HIGH (ref 11.5–15.5)
WBC: 11.3 10*3/uL — ABNORMAL HIGH (ref 4.0–10.5)
nRBC: 0 % (ref 0.0–0.2)

## 2021-07-04 LAB — COMPREHENSIVE METABOLIC PANEL
ALT: 16 U/L (ref 0–44)
AST: 23 U/L (ref 15–41)
Albumin: 3.5 g/dL (ref 3.5–5.0)
Alkaline Phosphatase: 61 U/L (ref 38–126)
Anion gap: 7 (ref 5–15)
BUN: 7 mg/dL (ref 6–20)
CO2: 24 mmol/L (ref 22–32)
Calcium: 8.5 mg/dL — ABNORMAL LOW (ref 8.9–10.3)
Chloride: 107 mmol/L (ref 98–111)
Creatinine, Ser: 0.82 mg/dL (ref 0.44–1.00)
GFR, Estimated: >60 mL/min (ref >60)
Glucose, Bld: 95 mg/dL (ref 70–99)
Potassium: 3.1 mmol/L — ABNORMAL LOW (ref 3.5–5.1)
Sodium: 138 mmol/L (ref 135–145)
Total Bilirubin: 0.5 mg/dL (ref 0.3–1.2)
Total Protein: 6.9 g/dL (ref 6.5–8.1)

## 2021-07-04 LAB — RESP PANEL BY RT-PCR (FLU A&B, COVID) ARPGX2
Influenza A by PCR: NEGATIVE
Influenza B by PCR: NEGATIVE
SARS Coronavirus 2 by RT PCR: POSITIVE — AB

## 2021-07-04 MED ORDER — HEPARIN SODIUM (PORCINE) 5000 UNIT/ML IJ SOLN
5000.0000 [IU] | Freq: Three times a day (TID) | INTRAMUSCULAR | Status: DC
  Administered 2021-07-04 – 2021-07-06 (×7): 5000 [IU] via SUBCUTANEOUS
  Filled 2021-07-04 (×7): qty 1

## 2021-07-04 MED ORDER — SUCRALFATE 1 G PO TABS
1.0000 g | ORAL_TABLET | Freq: Two times a day (BID) | ORAL | Status: DC
  Administered 2021-07-04 – 2021-07-10 (×11): 1 g via ORAL
  Filled 2021-07-04 (×10): qty 1

## 2021-07-04 MED ORDER — OXYCODONE-ACETAMINOPHEN 5-325 MG PO TABS
1.0000 | ORAL_TABLET | Freq: Once | ORAL | Status: DC
  Filled 2021-07-04: qty 1

## 2021-07-04 MED ORDER — MORPHINE SULFATE (PF) 2 MG/ML IV SOLN
1.0000 mg | INTRAVENOUS | Status: DC | PRN
  Administered 2021-07-04 – 2021-07-05 (×2): 1 mg via INTRAVENOUS
  Filled 2021-07-04 (×2): qty 1

## 2021-07-04 MED ORDER — METOCLOPRAMIDE HCL 5 MG PO TABS: 5.0000 mg | ORAL_TABLET | Freq: Two times a day (BID) | ORAL | Status: DC | PRN

## 2021-07-04 MED ORDER — FENTANYL CITRATE PF 50 MCG/ML IJ SOSY
50.0000 ug | PREFILLED_SYRINGE | Freq: Once | INTRAMUSCULAR | Status: AC
  Administered 2021-07-04: 50 ug via INTRAVENOUS
  Filled 2021-07-04: qty 1

## 2021-07-04 MED ORDER — FLUOXETINE HCL 20 MG PO CAPS
60.0000 mg | ORAL_CAPSULE | Freq: Every morning | ORAL | Status: DC
  Administered 2021-07-05 – 2021-07-10 (×5): 60 mg via ORAL
  Filled 2021-07-04 (×5): qty 3

## 2021-07-04 MED ORDER — DOXEPIN HCL 50 MG PO CAPS
75.0000 mg | ORAL_CAPSULE | Freq: Every day | ORAL | Status: DC
  Administered 2021-07-04 – 2021-07-09 (×6): 75 mg via ORAL
  Filled 2021-07-04 (×8): qty 1

## 2021-07-04 MED ORDER — ONDANSETRON 4 MG PO TBDP
8.0000 mg | ORAL_TABLET | Freq: Three times a day (TID) | ORAL | Status: DC | PRN
  Administered 2021-07-05: 8 mg via ORAL
  Filled 2021-07-04: qty 1

## 2021-07-04 MED ORDER — OXYCODONE HCL 5 MG PO TABS
10.0000 mg | ORAL_TABLET | Freq: Once | ORAL | Status: AC
  Administered 2021-07-04: 10 mg via ORAL
  Filled 2021-07-04: qty 2

## 2021-07-04 MED ORDER — PANTOPRAZOLE SODIUM 40 MG PO TBEC
40.0000 mg | DELAYED_RELEASE_TABLET | Freq: Two times a day (BID) | ORAL | Status: DC
  Administered 2021-07-04 – 2021-07-10 (×11): 40 mg via ORAL
  Filled 2021-07-04 (×11): qty 1

## 2021-07-04 MED ORDER — LINACLOTIDE 145 MCG PO CAPS
290.0000 ug | ORAL_CAPSULE | Freq: Every day | ORAL | Status: DC
  Administered 2021-07-05 – 2021-07-10 (×5): 290 ug via ORAL
  Filled 2021-07-04 (×6): qty 2

## 2021-07-04 MED ORDER — KCL IN DEXTROSE-NACL 20-5-0.9 MEQ/L-%-% IV SOLN
INTRAVENOUS | Status: DC
  Filled 2021-07-04 (×7): qty 1000

## 2021-07-04 NOTE — ED Provider Notes (Signed)
[facility] [facility] DEPT Provider Note   CSN: [id number] Arrival date & time: 07/04/21  1535     History Chief Complaint  Patient presents with   Rectal Bleeding    Emily Park is a 50 y.o. female.   Rectal Bleeding Associated symptoms: no abdominal pain, no dizziness, no fever and no vomiting   Patient presents for rectal prolapse.  She reports some straining with bowel movements last night.  She she has had a prolapsed rectum since this morning.  This has happened 1 time before and she states they had to do manual reduction with sugar in the ED. additional medical history is notable for chronic GI symptoms, including nausea, vomiting, and constipation.  She is followed by [facility].  It was her GI doctor who told her to come to the ED today.  She has also had some issues lately with hypokalemia.  She takes potassium and magnesium supplementation at home.  Yesterday, she was in her normal state of health.  Today, she has had severe pain in the area of her prolapsed rectum.  She has had small amount of bleeding from this area.  She has not had any fevers or chills.  Currently, she denies any other areas of pain or any nausea.  Past Medical History:  Diagnosis Date   Abnormal TSH    ADD (attention deficit disorder)    Anemia    Anxiety    Arrhythmia    Decreased libido    Depression    Heart murmur    Hyperlipidemia    Hypokalemia    Migraine    Nephrolithiasis    Pap smear abnormality of cervix/human papillomavirus (HPV) positive    Sleep difficulties    SVT (supraventricular tachycardia) (HCC)     Patient Active Problem List   Diagnosis Date Noted   IDA (iron deficiency anemia) 05/13/2021   Hypokalemia 05/13/2021   Diarrhea 04/14/2021   Intractable vomiting with nausea 04/14/2021   Lower urinary tract infectious disease 04/14/2021   Sepsis ([place]) 04/13/2021   Atypical squamous cells of undetermined significance on cytologic smear of cervix  (ASC-US) 02/24/2021   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   Anxiety 02/24/2021   Heart murmur 02/24/2021   Thyroid nodule 10/26/2020   AKI (acute kidney injury) ([place])    Hypotension    Hypovolemic shock ([place]) 08/29/2020   Rectal prolapse 04/05/2020   Migraine 02/27/2019   Mild episode of recurrent major depressive disorder ([place]) 10/25/2018   Abnormal thyroid stimulating hormone (TSH) level 10/24/2018   Syncope and collapse 10/10/2018   Lung nodules 11/14/2015   Paroxysmal SVT (supraventricular tachycardia) ([place]) 11/13/2015   Attention deficit hyperactivity disorder (ADHD) 11/13/2015    Past Surgical History:  Procedure Laterality Date   ABLATION     Cardiac ablation, endometrial ablation   BREAST LUMPECTOMY     right breast   BREAST LUMPECTOMY Right    CARDIOVERSION     COLONOSCOPY WITH PROPOFOL N/A 05/16/2021   Procedure: COLONOSCOPY WITH PROPOFOL;  Surgeon: Wilford Corner, MD;  Location: [facility];  Service: Endoscopy;  Laterality: N/A;   COLPOSCOPY     w/ cervical biopsy   DILITATION & CURRETTAGE/HYSTROSCOPY WITH NOVASURE ABLATION N/A 09/11/2014   Procedure: DILATATION & CURETTAGE/HYSTEROSCOPY WITH NOVASURE ABLATION;  Surgeon: Lovenia Kim, MD;  Location: [facility];  Service: Gynecology;  Laterality: N/A;   GIVENS CAPSULE STUDY N/A 05/16/2021   Procedure: GIVENS CAPSULE STUDY;  Surgeon: Wilford Corner, MD;  Location: [facility];  Service:  Endoscopy;  Laterality: N/A;     OB History     Gravida      Para      Term      Preterm      AB      Living  2      SAB      IAB      Ectopic      Multiple      Live Births              Family History  Problem Relation Age of Onset   Hypothyroidism Mother    Depression Mother    Cancer Mother        thyroid   Thyroid disease Mother    Hypertension Father    Heart attack Father        Age 17   AAA (abdominal aortic aneurysm) Father    Hypothyroidism Sister    Hyperthyroidism Sister    Diabetes Maternal  Grandfather    Breast cancer Paternal Grandmother     Social History   Tobacco Use   Smoking status: Never    Passive exposure: Current   Smokeless tobacco: Never  Vaping Use   Vaping Use: Never used  Substance Use Topics   Alcohol use: Not Currently    Alcohol/week: 2.0 standard drinks    Types: 2 Glasses of wine per week    Comment: occasional wine   Drug use: Not Currently    Home Medications Prior to Admission medications   Medication Sig Start Date End Date Taking? Authorizing Provider  acetaminophen (TYLENOL) 500 MG tablet Take 1,000 mg by mouth every 6 (six) hours as needed for fever (pain).   Yes [provider]  amphetamine-dextroamphetamine (ADDERALL) 30 MG tablet Take 0.5 tablets by mouth every morning. 06/21/21  Yes [provider]  Ascorbic Acid (VITAMIN C PO) Take 1 tablet by mouth every morning.   Yes [provider]  CALCIUM PO Take 1 tablet by mouth every morning.   Yes [provider]  Cholecalciferol (VITAMIN D3 PO) Take 1 tablet by mouth every morning.   Yes [provider]  clonazePAM (KLONOPIN) 1 MG tablet Take 1 mg by mouth at bedtime. 01/20/21  Yes [provider]  doxepin (SINEQUAN) 75 MG capsule Take 75 mg by mouth at bedtime. 12/22/20  Yes [provider]  FLUoxetine (PROZAC) 20 MG capsule Take 60 mg by mouth every morning. 04/01/21  Yes [provider]  ibuprofen (ADVIL) 200 MG tablet Take 800 mg by mouth every 6 (six) hours as needed for fever (pain).   Yes [provider]  Menaquinone-7 (K2 PO) Take 1 tablet by mouth every morning.   Yes [provider]  Multiple Vitamin (MULTIVITAMIN WITH MINERALS) TABS tablet Take 1 tablet by mouth every morning.   Yes [provider]  NIACIN PO Take 1 tablet by mouth every morning.   Yes [provider]  pantoprazole (PROTONIX) 40 MG tablet Take 40 mg by mouth 2 (two) times daily. 04/08/21  Yes [provider]  potassium chloride SA (KLOR-CON) 20 MEQ tablet Take 2 tablets (40 mEq total) by mouth daily. Patient taking differently: Take 40 mEq by mouth every morning. 02/15/21  Yes Pfeiffer, Jeannie Done, MD  promethazine (PHENERGAN) 25 MG tablet Take 1 tablet (25 mg total) by mouth every 6 (six) hours as needed for nausea or vomiting. 02/15/21  Yes Pfeiffer, Jeannie Done, MD  sucralfate (CARAFATE) 1 g tablet Take 1 g  by mouth 2 (two) times daily. 04/28/21  Yes [provider]  topiramate (TOPAMAX) 50 MG tablet Take 150 mg by mouth every morning. 01/11/21  Yes [provider]  TURMERIC PO Take 1 capsule by mouth every morning.   Yes [provider]  linaclotide (LINZESS) 290 MCG CAPS capsule Take 1 capsule (290 mcg total) by mouth daily before breakfast. Patient not taking: No sig reported 05/17/21   Caren Griffins, MD  metoCLOPramide (REGLAN) 5 MG tablet Take 5 mg by mouth 2 (two) times daily as needed for nausea or vomiting. Patient not taking: Reported on 07/04/2021 04/28/21   [provider]  ondansetron (ZOFRAN-ODT) 8 MG disintegrating tablet Take 8 mg by mouth every 8 (eight) hours as needed for nausea or vomiting. Patient not taking: Reported on 07/04/2021    [provider]    Allergies    Amoxicillin, Bactrim [sulfamethoxazole-trimethoprim], and Zithromax [azithromycin]  Review of Systems   Review of Systems  Constitutional:  Negative for chills, fatigue and fever.  HENT:  Negative for ear pain and sore throat.   Eyes:  Negative for pain and visual disturbance.  Respiratory:  Negative for cough and shortness of breath.   Cardiovascular:  Negative for chest pain and palpitations.  Gastrointestinal:  Positive for anal bleeding, constipation, hematochezia and rectal pain. Negative for abdominal distention, abdominal pain, diarrhea, nausea and vomiting.  Genitourinary:  Negative for dysuria, flank pain and hematuria.  Musculoskeletal:  Negative for  arthralgias, back pain, myalgias and neck pain.  Skin:  Negative for color change and rash.  Neurological:  Negative for dizziness, seizures and syncope.  All other systems reviewed and are negative.  Physical Exam Updated Vital Signs BP (!) 108/96   Pulse 76   Temp 98.3 F (36.8 C) (Oral)   Resp 20   Ht 5\' 4"  (1.626 m)   Wt 59 kg   SpO2 99%   BMI 22.31 kg/m   Physical Exam Vitals and nursing note reviewed. Exam conducted with a chaperone present.  Constitutional:      General: She is not in acute distress.    Appearance: Normal appearance. She is well-developed and normal weight. She is not ill-appearing, toxic-appearing or diaphoretic.  HENT:     Head: Normocephalic and atraumatic.     Right Ear: External ear normal.     Left Ear: External ear normal.     Nose: Nose normal.     Mouth/Throat:     Mouth: Mucous membranes are moist.     Pharynx: Oropharynx is clear.  Eyes:     Conjunctiva/sclera: Conjunctivae normal.  Cardiovascular:     Rate and Rhythm: Normal rate and regular rhythm.     Heart sounds: No murmur heard. Pulmonary:     Effort: Pulmonary effort is normal. No respiratory distress.  Abdominal:     General: There is no distension.     Palpations: Abdomen is soft.     Tenderness: There is no abdominal tenderness.  Genitourinary:    Comments: Large, softball sized, prolapsed rectum.  Tissue is red and well perfused. Musculoskeletal:        General: Normal range of motion.     Cervical back: Normal range of motion and neck supple.     Right lower leg: No edema.     Left lower leg: No edema.  Skin:    General: Skin is warm and dry.  Neurological:     General: No focal deficit present.  Mental Status: She is alert and oriented to person, place, and time.  Psychiatric:        Mood and Affect: Mood normal.        Behavior: Behavior normal.        Thought Content: Thought content normal.        Judgment: Judgment normal.    ED Results / Procedures /  Treatments   Labs (all labs ordered are listed, but only abnormal results are displayed) Labs Reviewed  RESP PANEL BY RT-PCR (FLU A&B, COVID) ARPGX2 - Abnormal; Notable for the following components:      Result Value   SARS Coronavirus 2 by RT PCR POSITIVE (*)    All other components within normal limits  COMPREHENSIVE METABOLIC PANEL - Abnormal; Notable for the following components:   Potassium 3.1 (*)    Calcium 8.5 (*)    All other components within normal limits  CBC WITH DIFFERENTIAL/PLATELET - Abnormal; Notable for the following components:   WBC 11.3 (*)    RBC 3.79 (*)    Hemoglobin 10.9 (*)    HCT 33.5 (*)    RDW 21.8 (*)    Neutro Abs 7.8 (*)    All other components within normal limits    EKG None  Radiology No results found.  Procedures Procedures   Medications Ordered in ED Medications  doxepin (SINEQUAN) capsule 75 mg (75 mg Oral Given 07/04/21 2253)  FLUoxetine (PROZAC) capsule 60 mg (has no administration in time range)  linaclotide (LINZESS) capsule 290 mcg (has no administration in time range)  metoCLOPramide (REGLAN) tablet 5 mg (has no administration in time range)  ondansetron (ZOFRAN-ODT) disintegrating tablet 8 mg (has no administration in time range)  pantoprazole (PROTONIX) EC tablet 40 mg (40 mg Oral Given 07/04/21 2146)  sucralfate (CARAFATE) tablet 1 g (1 g Oral Given 07/04/21 2146)  heparin injection 5,000 Units (5,000 Units Subcutaneous Given 07/04/21 2147)  dextrose 5 % and 0.9 % NaCl with KCl 20 mEq/L infusion ( Intravenous New Bag/Given 07/04/21 2256)  morphine 2 MG/ML injection 1 mg (1 mg Intravenous Given 07/04/21 1927)  oxyCODONE (Oxy IR/ROXICODONE) immediate release tablet 10 mg (10 mg Oral Given 07/04/21 1658)  fentaNYL (SUBLIMAZE) injection 50 mcg (50 mcg Intravenous Given 07/04/21 1806)    ED Course  I have reviewed the triage vital signs and the nursing notes.  Pertinent labs & imaging results that were available during my care of the  patient were reviewed by me and considered in my medical decision making (see chart for details).    MDM Rules/Calculators/A&P                          Patient is a 50 year old female presenting for prolapsed rectum.  Onset of symptoms was reportedly this morning.  On arrival in the ED, she has normal vital signs.  She does appear uncomfortable.  Patient was moved to a private room for examination of the area of concern.  Prior to examination and attempted bedside reduction, patient was given 10 mg of Roxicodone.  On reassessment, she reports that her pain is slightly improved.  At this point, she was agreeable to exam and attempt at reduction.  Prolapsed rectum found to be approximately softball sized.  Tissue does appear to be well-perfused.  Table sugar was placed over mucosa.  Bedside reduction was attempted with gentle pressure.  This was unsuccessful.  Surgery was consulted.  Surgeon on-call came to evaluate the  patient in the ED.  Bedside reduction of rectal prolapse was successful.  Following this, she had significant improvement in her discomfort.  Patient was subsequently admitted to surgery.  Case to be discussed with colorectal specialist in the morning.  She was advised to stay n.p.o. at midnight.  Final Clinical Impression(s) / ED Diagnoses Final diagnoses:  Rectal prolapse    Rx / DC Orders ED Discharge Orders     None        Godfrey Pick, MD 07/05/21 [phone]

## 2021-07-04 NOTE — ED Triage Notes (Signed)
Pt reports having a rectal prolapse. States this has happened once before. States today she was straining last night while having a BM and "it all came out" this morning.

## 2021-07-04 NOTE — H&P (Signed)
Emily Park is an 50 y.o. female.   Chief Complaint: rectal swelling HPI: The patient is a 50 year old white female who has significant GI symptoms of constipation and straining. She is followed by [facility]. She has had 2 episodes of rectal prolapse in the last 2 months. The most recent episode started yesterday with some straining. She denies any nausea or vomiting although she has been recently diagnosed with gastric ulcers and gastroparesis. No fever  Past Medical History:  Diagnosis Date   Abnormal TSH    ADD (attention deficit disorder)    Anemia    Anxiety    Arrhythmia    Decreased libido    Depression    Heart murmur    Hyperlipidemia    Hypokalemia    Migraine    Nephrolithiasis    Pap smear abnormality of cervix/human papillomavirus (HPV) positive    Sleep difficulties    SVT (supraventricular tachycardia) (HCC)     Past Surgical History:  Procedure Laterality Date   ABLATION     Cardiac ablation, endometrial ablation   BREAST LUMPECTOMY     right breast   BREAST LUMPECTOMY Right    CARDIOVERSION     COLONOSCOPY WITH PROPOFOL N/A 05/16/2021   Procedure: COLONOSCOPY WITH PROPOFOL;  Surgeon: Wilford Corner, MD;  Location: [facility];  Service: Endoscopy;  Laterality: N/A;   COLPOSCOPY     w/ cervical biopsy   DILITATION & CURRETTAGE/HYSTROSCOPY WITH NOVASURE ABLATION N/A 09/11/2014   Procedure: DILATATION & CURETTAGE/HYSTEROSCOPY WITH NOVASURE ABLATION;  Surgeon: Lovenia Kim, MD;  Location: [facility];  Service: Gynecology;  Laterality: N/A;   GIVENS CAPSULE STUDY N/A 05/16/2021   Procedure: GIVENS CAPSULE STUDY;  Surgeon: Wilford Corner, MD;  Location: [facility];  Service: Endoscopy;  Laterality: N/A;    Family History  Problem Relation Age of Onset   Hypothyroidism Mother    Depression Mother    Cancer Mother        thyroid   Thyroid disease Mother    Hypertension Father    Heart attack Father        Age 56   AAA (abdominal aortic aneurysm)  Father    Hypothyroidism Sister    Hyperthyroidism Sister    Diabetes Maternal Grandfather    Breast cancer Paternal Grandmother    Social History:  reports that she has never smoked. She has been exposed to tobacco smoke. She has never used smokeless tobacco. She reports that she does not currently use alcohol after a past usage of about 2.0 standard drinks per week. She reports that she does not currently use drugs.  Allergies:  Allergies  Allergen Reactions   Amoxicillin Rash   Bactrim [Sulfamethoxazole-Trimethoprim] Rash    Rash noted to chest and back   Zithromax [Azithromycin] Rash    (Not in a hospital admission)   Results for orders placed or performed during the hospital encounter of 07/04/21 (from the past 48 hour(s))  Comprehensive metabolic panel     Status: Abnormal   Collection Time: 07/04/21  5:18 PM  Result Value Ref Range   Sodium 138 135 - 145 mmol/L   Potassium 3.1 (L) 3.5 - 5.1 mmol/L   Chloride 107 98 - 111 mmol/L   CO2 24 22 - 32 mmol/L   Glucose, Bld 95 70 - 99 mg/dL    Comment: Glucose reference range applies only to samples taken after fasting for at least 8 hours.   BUN 7 6 - 20 mg/dL  Creatinine, Ser 0.82 0.44 - 1.00 mg/dL   Calcium 8.5 (L) 8.9 - 10.3 mg/dL   Total Protein 6.9 6.5 - 8.1 g/dL   Albumin 3.5 3.5 - 5.0 g/dL   AST 23 15 - 41 U/L   ALT 16 0 - 44 U/L   Alkaline Phosphatase 61 38 - 126 U/L   Total Bilirubin 0.5 0.3 - 1.2 mg/dL   GFR, Estimated >60 >60 mL/min    Comment: (NOTE) Calculated using the CKD-EPI Creatinine Equation (2021)    Anion gap 7 5 - 15    Comment: Performed at [facility], [place] [address] [place], [place] [address]  CBC with Differential     Status: Abnormal (Preliminary result)   Collection Time: 07/04/21  5:18 PM  Result Value Ref Range   WBC 11.3 (H) 4.0 - 10.5 K/uL   RBC 3.79 (L) 3.87 - 5.11 MIL/uL   Hemoglobin 10.9 (L) 12.0 - 15.0 g/dL   HCT 33.5 (L) 36.0 - 46.0 %   MCV 88.4 80.0 -  100.0 fL   MCH 28.8 26.0 - 34.0 pg   MCHC 32.5 30.0 - 36.0 g/dL   RDW 21.8 (H) 11.5 - 15.5 %   Platelets 321 150 - 400 K/uL   nRBC 0.0 0.0 - 0.2 %    Comment: Performed at [facility], [place] [address], [place], [place] [address]   Neutrophils Relative % PENDING %   Neutro Abs PENDING 1.7 - 7.7 K/uL   Band Neutrophils PENDING %   Lymphocytes Relative PENDING %   Lymphs Abs PENDING 0.7 - 4.0 K/uL   Monocytes Relative PENDING %   Monocytes Absolute PENDING 0.1 - 1.0 K/uL   Eosinophils Relative PENDING %   Eosinophils Absolute PENDING 0.0 - 0.5 K/uL   Basophils Relative PENDING %   Basophils Absolute PENDING 0.0 - 0.1 K/uL   WBC Morphology PENDING    RBC Morphology PENDING    Smear Review PENDING    Other PENDING %   nRBC PENDING 0 /100 WBC   Metamyelocytes Relative PENDING %   Myelocytes PENDING %   Promyelocytes Relative PENDING %   Blasts PENDING %   Immature Granulocytes PENDING %   Abs Immature Granulocytes PENDING 0.00 - 0.07 K/uL   No results found.  Review of Systems  Constitutional: Negative.   HENT: Negative.    Eyes: Negative.   Respiratory: Negative.    Cardiovascular: Negative.   Gastrointestinal:  Positive for abdominal distention, constipation and diarrhea.  Endocrine: Negative.   Genitourinary: Negative.   Musculoskeletal: Negative.   Skin: Negative.   Allergic/Immunologic: Negative.   Neurological: Negative.   Hematological: Negative.   Psychiatric/Behavioral: Negative.     Blood pressure 98/67, pulse 74, temperature 98.3 F (36.8 C), temperature source Oral, resp. rate 18, height 5\' 4"  (1.626 m), weight 59 kg, SpO2 99 %. Physical Exam Constitutional:      General: She is not in acute distress.    Appearance: Normal appearance. She is normal weight.  HENT:     Head: Normocephalic and atraumatic.     Right Ear: External ear normal.     Left Ear: External ear normal.     Nose: Nose normal.     Mouth/Throat:     Mouth: Mucous  membranes are moist.     Pharynx: Oropharynx is clear.  Eyes:     General: No scleral icterus.    Extraocular Movements: Extraocular movements intact.     Conjunctiva/sclera: Conjunctivae  normal.     Pupils: Pupils are equal, round, and reactive to light.  Cardiovascular:     Rate and Rhythm: Normal rate and regular rhythm.     Pulses: Normal pulses.     Heart sounds: Normal heart sounds.  Pulmonary:     Effort: Pulmonary effort is normal. No respiratory distress.     Breath sounds: Normal breath sounds.  Abdominal:     General: Abdomen is flat. Bowel sounds are normal.     Palpations: Abdomen is soft.     Tenderness: There is no abdominal tenderness.  Genitourinary:    Comments: There is rectal prolapse that was reducible Musculoskeletal:        General: No swelling or deformity. Normal range of motion.     Cervical back: Normal range of motion and neck supple. No tenderness.  Skin:    General: Skin is warm and dry.     Coloration: Skin is not jaundiced.  Neurological:     General: No focal deficit present.     Mental Status: She is alert and oriented to person, place, and time.  Psychiatric:        Mood and Affect: Mood normal.        Behavior: Behavior normal.        Thought Content: Thought content normal.     Assessment/Plan The patient has recurrent rectal prolapse that is able to be reduced. Will admit for hydration and allow clears. Will discuss with our colorectal specialists in the am. No need for urgent surgery tonight.  Autumn Messing III, MD 07/04/2021, 7:09 PM

## 2021-07-04 NOTE — ED Provider Notes (Signed)
Emergency Medicine Provider Triage Evaluation Note  Emily Park , a 50 y.o. female  was evaluated in triage.  Pt complains of rectal prolapse.  She states that her rectum has been prolapse since this morning bed.  States that she woke up with mucus and small amount of blood spotting on her bed.  States that she could feel the rectal prolapse.  States that she has strained yesterday when having a bowel movement.  States that she called her gastroenterologist Dr. Michail Sermon who told her to present to the emergency department.  She has had previous rectal prolapse that was able to be reduced with sugar.  Denies fevers.  Review of Systems  Positive: Rectal prolapse Negative: Abdominal pain  Physical Exam  BP 109/78 (BP Location: Left Arm)   Pulse (!) 103   Temp 98.3 F (36.8 C) (Oral)   Resp 18   Ht 5\' 4"  (1.626 m)   Wt 59 kg   SpO2 99%   BMI 22.31 kg/m  Gen:   Awake, no distress   Resp:  Normal effort  MSK:   Moves extremities without difficulty  Other:  Approximately 4 inches of rectal prolapse with mucus.  The mucosa is pink and warm.  It is not dusky.  Medical Decision Making  Medically screening exam initiated at 4:48 PM.  Appropriate orders placed.  Emily Park was informed that the remainder of the evaluation will be completed by another provider, this initial triage assessment does not replace that evaluation, and the importance of remaining in the ED until their evaluation is complete.  Dr. Doren Custard asked to come to bedside to evaluate rectal prolapse for reduction.  The patient declines attempting manual reduction at bedside in triage.  She would like pain medication first.  Placed 10 mg of oral oxycodone and will attempt to expedite bed placement to try reduction.   Mickie Hillier, PA-C 07/04/21 1651    Godfrey Pick, MD 07/05/21 [phone]

## 2021-07-05 ENCOUNTER — Other Ambulatory Visit: Payer: Self-pay

## 2021-07-05 MED ORDER — ACETAMINOPHEN 325 MG PO TABS: 650.0000 mg | ORAL_TABLET | Freq: Four times a day (QID) | ORAL | Status: DC | PRN

## 2021-07-05 MED ORDER — METHOCARBAMOL 500 MG PO TABS: 500.0000 mg | ORAL_TABLET | Freq: Four times a day (QID) | ORAL | Status: DC | PRN

## 2021-07-05 MED ORDER — TRAMADOL HCL 50 MG PO TABS
50.0000 mg | ORAL_TABLET | Freq: Four times a day (QID) | ORAL | Status: DC | PRN
  Administered 2021-07-05: 50 mg via ORAL
  Filled 2021-07-05 (×2): qty 1

## 2021-07-05 MED ORDER — ONDANSETRON HCL 4 MG/2ML IJ SOLN
4.0000 mg | INTRAMUSCULAR | Status: DC | PRN
  Administered 2021-07-05 – 2021-07-06 (×2): 4 mg via INTRAVENOUS
  Filled 2021-07-05 (×2): qty 2

## 2021-07-05 MED ORDER — MORPHINE SULFATE (PF) 2 MG/ML IV SOLN
1.0000 mg | INTRAVENOUS | Status: DC | PRN
  Administered 2021-07-05 – 2021-07-06 (×2): 1 mg via INTRAVENOUS
  Filled 2021-07-05 (×2): qty 1

## 2021-07-05 MED ORDER — ONDANSETRON HCL 4 MG/2ML IJ SOLN: 4.0000 mg | Freq: Four times a day (QID) | INTRAMUSCULAR | Status: DC

## 2021-07-05 MED ORDER — POLYETHYLENE GLYCOL 3350 17 G PO PACK
17.0000 g | PACK | Freq: Two times a day (BID) | ORAL | Status: DC
  Administered 2021-07-05 – 2021-07-10 (×7): 17 g via ORAL
  Filled 2021-07-05 (×9): qty 1

## 2021-07-05 NOTE — Progress Notes (Signed)
Patient actively vomiting. Pt only has PO antiemetics ordered at this time. Called [facility] on call number and spoke w/ answering service. Sent a message to MD asking for IV antiemetics and a return call to patient nurse Hailei at [phone].

## 2021-07-05 NOTE — ED Notes (Signed)
O2 stats were 90% RA, 2LNC placed on pt place and O2 stat increased to 95%.

## 2021-07-05 NOTE — Consult Note (Signed)
Referring Provider: Dr. Autumn Messing Primary Care Physician:  [name], MD Primary Gastroenterologist:  [facility] - Dr. Alessandra Bevels  Reason for Consultation:  rectal prolapse  HPI: Emily Park is a 50 y.o. female who presented to the ED 07/04/2021 for rectal prolapse.    Patient is established with [facility].  Last seen at the office 05/06/2021.   Patient has a history of iron deficiency anemia, constipation, rectal prolapse, GERD, nausea, vomiting.   Patient had an endoscopy with Dr. Alessandra Bevels 04/27/2021 showed abnormal motility in the esophagus and esophageal body, superficial esophageal ulcers without bleeding, large hiatal hernia, small amount of food noted in the entire exam. Last colonoscopy 05/16/2021 for anemia showed internal hemorrhoids, otherwise normal. Patient has chronic constipation and is on Benefiber, MiraLAX, Linzess 230mcg.  Patient's been out of her Linzess 290 mcg for at least a month.   Last bowel movement 4 days ago.   Has been having mucus and very small amounts of bright red blood with straining.   Yesterday during one of his episodes of straining had rectal prolapse. Patient had similar experience about a year to 2 years ago at [facility], had manual reduction in the ER at that time. Patient was having abdominal pain and rectal pain with this so presented to the ER. Manual reduction of rectal prolapse was successful by general surgery in the ER this visit. Patient denies fever, chills.  Patient was seen Friday by urgent care for cough and wheezing, was tested for COVID at that time but never received test results.  She did test positive for COVID this admission.  Last week patient was having nausea and vomiting for 2 to 3 days. Patient has history of gastroparesis, states felt similar to previous episodes, and went on liquid diet. Nonbloody emesis, dysphagia, melena.  Past Medical History:  Diagnosis Date   Abnormal TSH    ADD (attention deficit disorder)     Anemia    Anxiety    Arrhythmia    Decreased libido    Depression    Heart murmur    Hyperlipidemia    Hypokalemia    Migraine    Nephrolithiasis    Pap smear abnormality of cervix/human papillomavirus (HPV) positive    Sleep difficulties    SVT (supraventricular tachycardia) (HCC)     Past Surgical History:  Procedure Laterality Date   ABLATION     Cardiac ablation, endometrial ablation   BREAST LUMPECTOMY     right breast   BREAST LUMPECTOMY Right    CARDIOVERSION     COLONOSCOPY WITH PROPOFOL N/A 05/16/2021   Procedure: COLONOSCOPY WITH PROPOFOL;  Surgeon: Wilford Corner, MD;  Location: [facility];  Service: Endoscopy;  Laterality: N/A;   COLPOSCOPY     w/ cervical biopsy   DILITATION & CURRETTAGE/HYSTROSCOPY WITH NOVASURE ABLATION N/A 09/11/2014   Procedure: DILATATION & CURETTAGE/HYSTEROSCOPY WITH NOVASURE ABLATION;  Surgeon: Lovenia Kim, MD;  Location: [facility];  Service: Gynecology;  Laterality: N/A;   GIVENS CAPSULE STUDY N/A 05/16/2021   Procedure: GIVENS CAPSULE STUDY;  Surgeon: Wilford Corner, MD;  Location: [facility];  Service: Endoscopy;  Laterality: N/A;    Prior to Admission medications   Medication Sig Start Date End Date Taking? Authorizing Provider  acetaminophen (TYLENOL) 500 MG tablet Take 1,000 mg by mouth every 6 (six) hours as needed for fever (pain).   Yes [provider]  amphetamine-dextroamphetamine (ADDERALL) 30 MG tablet Take 0.5 tablets by mouth every morning. 06/21/21  Yes [provider]  Ascorbic Acid (VITAMIN C PO) Take 1 tablet by mouth every morning.   Yes [provider]  CALCIUM PO Take 1 tablet by mouth every morning.   Yes [provider]  Cholecalciferol (VITAMIN D3 PO) Take 1 tablet by mouth every morning.   Yes [provider]  clonazePAM (KLONOPIN) 1 MG tablet Take 1 mg by mouth at bedtime. 01/20/21  Yes [provider]  doxepin (SINEQUAN) 75 MG capsule Take 75 mg by  mouth at bedtime. 12/22/20  Yes [provider]  FLUoxetine (PROZAC) 20 MG capsule Take 60 mg by mouth every morning. 04/01/21  Yes [provider]  ibuprofen (ADVIL) 200 MG tablet Take 800 mg by mouth every 6 (six) hours as needed for fever (pain).   Yes [provider]  Menaquinone-7 (K2 PO) Take 1 tablet by mouth every morning.   Yes [provider]  Multiple Vitamin (MULTIVITAMIN WITH MINERALS) TABS tablet Take 1 tablet by mouth every morning.   Yes [provider]  NIACIN PO Take 1 tablet by mouth every morning.   Yes [provider]  pantoprazole (PROTONIX) 40 MG tablet Take 40 mg by mouth 2 (two) times daily. 04/08/21  Yes [provider]  potassium chloride SA (KLOR-CON) 20 MEQ tablet Take 2 tablets (40 mEq total) by mouth daily. Patient taking differently: Take 40 mEq by mouth every morning. 02/15/21  Yes Pfeiffer, Jeannie Done, MD  promethazine (PHENERGAN) 25 MG tablet Take 1 tablet (25 mg total) by mouth every 6 (six) hours as needed for nausea or vomiting. 02/15/21  Yes Pfeiffer, Jeannie Done, MD  sucralfate (CARAFATE) 1 g tablet Take 1 g by mouth 2 (two) times daily. 04/28/21  Yes [provider]  topiramate (TOPAMAX) 50 MG tablet Take 150 mg by mouth every morning. 01/11/21  Yes [provider]  TURMERIC PO Take 1 capsule by mouth every morning.   Yes [provider]  linaclotide (LINZESS) 290 MCG CAPS capsule Take 1 capsule (290 mcg total) by mouth daily before breakfast. Patient not taking: No sig reported 05/17/21   Caren Griffins, MD  metoCLOPramide (REGLAN) 5 MG tablet Take 5 mg by mouth 2 (two) times daily as needed for nausea or vomiting. Patient not taking: Reported on 07/04/2021 04/28/21   [provider]  ondansetron (ZOFRAN-ODT) 8 MG disintegrating tablet Take 8 mg by mouth every 8 (eight) hours as needed for nausea or vomiting. Patient not taking: Reported on 07/04/2021    [provider]     Scheduled Meds:  doxepin  75 mg Oral QHS   FLUoxetine  60 mg Oral q morning   heparin  5,000 Units Subcutaneous Q8H   linaclotide  290 mcg Oral QAC breakfast   pantoprazole  40 mg Oral BID   sucralfate  1 g Oral BID   Continuous Infusions:  dextrose 5 % and 0.9 % NaCl with KCl 20 mEq/L 75 mL/hr at 07/04/21 2256   PRN Meds:.metoCLOPramide, morphine injection, ondansetron  Allergies as of 07/04/2021 - Review Complete 07/04/2021  Allergen Reaction Noted   Amoxicillin Rash 02/24/2014   Bactrim [sulfamethoxazole-trimethoprim] Rash 11/13/2015   Zithromax [azithromycin] Rash 02/26/2019    Family History  Problem Relation Age of Onset   Hypothyroidism Mother    Depression Mother    Cancer Mother        thyroid   Thyroid disease Mother    Hypertension Father    Heart attack Father  Age 17   AAA (abdominal aortic aneurysm) Father    Hypothyroidism Sister    Hyperthyroidism Sister    Diabetes Maternal Grandfather    Breast cancer Paternal Grandmother     Social History   Socioeconomic History   Marital status: Legally Separated    Spouse name: Not on file   Number of children: 2   Years of education: college   Highest education level: Not on file  Occupational History   Occupation: Therapist, 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   Right-handed.   60 ounces of tea and soda per day.   Social Determinants of Health   Financial Resource Strain: Not on file  Food Insecurity: Not on file   Transportation Needs: Not on file  Physical Activity: Not on file  Stress: Not on file  Social Connections: Not on file  Intimate Partner Violence: Not on file    Review of Systems:  Review of Systems  Constitutional:  Positive for malaise/fatigue. Negative for chills, fever and weight loss.  HENT:  Positive for congestion. Negative for sore throat.   Respiratory:  Positive for cough, sputum production, shortness of breath and wheezing. Negative for hemoptysis.   Cardiovascular:  Negative for chest pain and leg swelling.  Gastrointestinal:  Positive for abdominal pain, blood in stool, constipation, nausea and vomiting. Negative for diarrhea, heartburn and melena.  Musculoskeletal:  Positive for myalgias. Negative for falls.  Neurological:  Negative for loss of consciousness.  Psychiatric/Behavioral:  Negative for memory loss.    Physical Exam: Vital signs: Vitals:   07/05/21 0758 07/05/21 0800  BP:  99/63  Pulse: 95 99  Resp:    Temp:    SpO2: 92% 92%     General:   Alert, in NAD Heart:  Regular rate and rhythm; no murmurs Pulm: Clear anteriorly; no wheezing Abdomen:  Soft, Flat AB, skin exam normal, Sluggish bowel sounds. mild tenderness in the lower abdomen. Without guarding and Without rebound, without hepatomegaly. Extremities:  Without edema. Neurologic:  Alert and  oriented x4;  grossly normal neurologically. Psych:  Alert and cooperative. Normal mood and affect.  GI:  Lab Results: Recent Labs    07/04/21 1718  WBC 11.3*  HGB 10.9*  HCT 33.5*  PLT 321   BMET Recent Labs    07/04/21 1718  NA 138  K 3.1*  CL 107  CO2 24  GLUCOSE 95  BUN 7  CREATININE 0.82  CALCIUM 8.5*   LFT Recent Labs    07/04/21 1718  PROT 6.9  ALBUMIN 3.5  AST 23  ALT 16  ALKPHOS 61  BILITOT 0.5   PT/INR No results for input(s): LABPROT, INR in the last 72 hours.  Studies/Results: No results found.  Impression Rectal prolapse  colonoscopy 05/16/2021 for anemia  showed internal hemorrhoids, otherwise normal. Constipation Gastroparesis GERD with large hiatal hernia, history of PUD COVID  Plan General surgery consulted Start back on linzess 290 mcg once daily with mirlax 17gram BID and benefiber 1 TBSP twice daily. Can consider switching to motegrity outpatient to help with gastroparesis Try to minimize opioid use if possible for history of constipation and gastroparesis Soft diet for gastroparesis, can do liquid diet for flares.  Pending gastroparesis study. Protonix 40 mg twice daily, Carafate as needed COVID-positive this visit, being followed by primary team.   LOS: 0 days   Vladimir Crofts  PA-C 07/05/2021, 9:31 AM  Contact #  [phone]

## 2021-07-05 NOTE — Progress Notes (Signed)
Verbal order from MD given for Zofran Q4 PRN IV advised.

## 2021-07-05 NOTE — Progress Notes (Signed)
Patient ID: SRAVYA GRISSOM, female   DOB: 1971-02-17, 50 y.o.   MRN: [id number] [facility] Surgery Progress Note     Subjective: CC-  Resting comfortably. No recurrence of rectal prolapse. Passing flatus, no BM. Denies abdominal pain, nausea, vomiting. Tolerating diet.  Objective: Vital signs in last 24 hours: Temp:  [98.3 F (36.8 C)] 98.3 F (36.8 C) (11/07 1627) Pulse Rate:  [74-117] 106 (11/08 1115) Resp:  [18-20] 18 (11/08 1115) BP: (98-121)/(63-96) 105/72 (11/08 1115) SpO2:  [90 %-99 %] 90 % (11/08 1115) Weight:  [59 kg] 59 kg (11/07 1631)    Intake/Output from previous day: No intake/output data recorded. Intake/Output this shift: No intake/output data recorded.  PE: Gen:  Alert, NAD, pleasant Pulm: rate and effort normal Abd: Soft, NT/ND  Lab Results:  Recent Labs    07/04/21 1718  WBC 11.3*  HGB 10.9*  HCT 33.5*  PLT 321   BMET Recent Labs    07/04/21 1718  NA 138  K 3.1*  CL 107  CO2 24  GLUCOSE 95  BUN 7  CREATININE 0.82  CALCIUM 8.5*   PT/INR No results for input(s): LABPROT, INR in the last 72 hours. CMP     Component Value Date/Time   NA 138 07/04/2021 1718   K 3.1 (L) 07/04/2021 1718   CL 107 07/04/2021 1718   CO2 24 07/04/2021 1718   GLUCOSE 95 07/04/2021 1718   BUN 7 07/04/2021 1718   CREATININE 0.82 07/04/2021 1718   CREATININE 1.49 (H) 05/13/2021 1434   CALCIUM 8.5 (L) 07/04/2021 1718   PROT 6.9 07/04/2021 1718   ALBUMIN 3.5 07/04/2021 1718   AST 23 07/04/2021 1718   AST 19 05/13/2021 1434   ALT 16 07/04/2021 1718   ALT 11 05/13/2021 1434   ALKPHOS 61 07/04/2021 1718   BILITOT 0.5 07/04/2021 1718   BILITOT 0.3 05/13/2021 1434   GFRNONAA >60 07/04/2021 1718   GFRNONAA 42 (L) 05/13/2021 1434   GFRAA >60 11/14/2015 0533   Lipase     Component Value Date/Time   LIPASE 39 04/13/2021 1155       Studies/Results: No results found.  Anti-infectives: Anti-infectives (From admission, onward)    None         Assessment/Plan Recurrent rectal prolapse - 2 episodes in the last 2 months - Appreciate GI recommendations, was started back on linzess, miralax, fiber, protonix, carafate. Will monitor over night and PO challenge and see if she has recurrence with bowel movement. Ultimately patient will need to have this fixed surgically, but need to get her constipation better managed. Currently this is reduced and she does not need any urgent surgical needs. Depending on how she does will discuss timing of surgery with MD.   ID - none FEN - IVF, soft diet VTE - scds, sq heparin Foley - none  COVID+  ADHD Migraines Anxiety/depression Hx arrhythmia s/p ablation Gastroparesis GERD Hiatal hernia PUD   LOS: 0 days    Wellington Hampshire, [facility] Surgery 07/05/2021, 1:29 PM Please see Amion for pager number during day hours 7:00am-4:30pm

## 2021-07-06 ENCOUNTER — Inpatient Hospital Stay (HOSPITAL_COMMUNITY): Payer: Commercial Managed Care - PPO

## 2021-07-06 ENCOUNTER — Encounter (HOSPITAL_COMMUNITY): Payer: Self-pay

## 2021-07-06 DIAGNOSIS — E876 Hypokalemia: Secondary | ICD-10-CM | POA: Diagnosis present

## 2021-07-06 DIAGNOSIS — U071 COVID-19: Secondary | ICD-10-CM | POA: Diagnosis present

## 2021-07-06 DIAGNOSIS — K449 Diaphragmatic hernia without obstruction or gangrene: Secondary | ICD-10-CM | POA: Diagnosis present

## 2021-07-06 DIAGNOSIS — Z87442 Personal history of urinary calculi: Secondary | ICD-10-CM | POA: Diagnosis not present

## 2021-07-06 DIAGNOSIS — K3184 Gastroparesis: Secondary | ICD-10-CM | POA: Diagnosis present

## 2021-07-06 DIAGNOSIS — F32A Depression, unspecified: Secondary | ICD-10-CM | POA: Diagnosis present

## 2021-07-06 DIAGNOSIS — K623 Rectal prolapse: Secondary | ICD-10-CM | POA: Diagnosis present

## 2021-07-06 DIAGNOSIS — J1282 Pneumonia due to coronavirus disease 2019: Secondary | ICD-10-CM | POA: Diagnosis present

## 2021-07-06 DIAGNOSIS — K219 Gastro-esophageal reflux disease without esophagitis: Secondary | ICD-10-CM | POA: Diagnosis present

## 2021-07-06 DIAGNOSIS — Z833 Family history of diabetes mellitus: Secondary | ICD-10-CM | POA: Diagnosis not present

## 2021-07-06 DIAGNOSIS — D72819 Decreased white blood cell count, unspecified: Secondary | ICD-10-CM | POA: Diagnosis present

## 2021-07-06 DIAGNOSIS — R1310 Dysphagia, unspecified: Secondary | ICD-10-CM | POA: Diagnosis present

## 2021-07-06 DIAGNOSIS — T50996A Underdosing of other drugs, medicaments and biological substances, initial encounter: Secondary | ICD-10-CM | POA: Diagnosis present

## 2021-07-06 DIAGNOSIS — Q438 Other specified congenital malformations of intestine: Secondary | ICD-10-CM | POA: Diagnosis not present

## 2021-07-06 DIAGNOSIS — K254 Chronic or unspecified gastric ulcer with hemorrhage: Secondary | ICD-10-CM | POA: Diagnosis present

## 2021-07-06 DIAGNOSIS — F419 Anxiety disorder, unspecified: Secondary | ICD-10-CM | POA: Diagnosis present

## 2021-07-06 DIAGNOSIS — Z881 Allergy status to other antibiotic agents status: Secondary | ICD-10-CM | POA: Diagnosis not present

## 2021-07-06 DIAGNOSIS — Z882 Allergy status to sulfonamides status: Secondary | ICD-10-CM | POA: Diagnosis not present

## 2021-07-06 DIAGNOSIS — F909 Attention-deficit hyperactivity disorder, unspecified type: Secondary | ICD-10-CM | POA: Diagnosis present

## 2021-07-06 DIAGNOSIS — R0902 Hypoxemia: Secondary | ICD-10-CM | POA: Diagnosis present

## 2021-07-06 DIAGNOSIS — Z880 Allergy status to penicillin: Secondary | ICD-10-CM | POA: Diagnosis not present

## 2021-07-06 DIAGNOSIS — K581 Irritable bowel syndrome with constipation: Secondary | ICD-10-CM | POA: Diagnosis present

## 2021-07-06 DIAGNOSIS — Z803 Family history of malignant neoplasm of breast: Secondary | ICD-10-CM | POA: Diagnosis not present

## 2021-07-06 LAB — FERRITIN: Ferritin: 102 ng/mL (ref 11–307)

## 2021-07-06 LAB — HEMOGLOBIN A1C
Hgb A1c MFr Bld: 4.9 % (ref 4.8–5.6)
Mean Plasma Glucose: 93.93 mg/dL

## 2021-07-06 LAB — D-DIMER, QUANTITATIVE: D-Dimer, Quant: 1.43 ug/mL-FEU — ABNORMAL HIGH (ref 0.00–0.50)

## 2021-07-06 LAB — C-REACTIVE PROTEIN: CRP: 2.9 mg/dL — ABNORMAL HIGH (ref <1.0)

## 2021-07-06 MED ORDER — ENSURE PRE-SURGERY PO LIQD: 592.0000 mL | Freq: Once | ORAL | Status: DC

## 2021-07-06 MED ORDER — GABAPENTIN 300 MG PO CAPS: 300.0000 mg | ORAL_CAPSULE | ORAL | Status: DC

## 2021-07-06 MED ORDER — ZINC SULFATE 220 (50 ZN) MG PO CAPS
220.0000 mg | ORAL_CAPSULE | Freq: Every day | ORAL | Status: DC
  Administered 2021-07-06 – 2021-07-10 (×4): 220 mg via ORAL
  Filled 2021-07-06 (×5): qty 1

## 2021-07-06 MED ORDER — ENOXAPARIN SODIUM 40 MG/0.4ML IJ SOSY
40.0000 mg | PREFILLED_SYRINGE | INTRAMUSCULAR | Status: AC
  Administered 2021-07-07: 40 mg via SUBCUTANEOUS

## 2021-07-06 MED ORDER — ALVIMOPAN 12 MG PO CAPS: 12.0000 mg | ORAL_CAPSULE | ORAL | Status: DC

## 2021-07-06 MED ORDER — SODIUM CHLORIDE 0.9 % IV SOLN
200.0000 mg | Freq: Once | INTRAVENOUS | Status: AC
  Administered 2021-07-06: 200 mg via INTRAVENOUS
  Filled 2021-07-06: qty 40

## 2021-07-06 MED ORDER — GENTAMICIN SULFATE 40 MG/ML IJ SOLN
5.0000 mg/kg | INTRAVENOUS | Status: DC
  Filled 2021-07-06: qty 7.5

## 2021-07-06 MED ORDER — HYDROCOD POLST-CPM POLST ER 10-8 MG/5ML PO SUER
5.0000 mL | Freq: Two times a day (BID) | ORAL | Status: DC | PRN
  Filled 2021-07-06: qty 5

## 2021-07-06 MED ORDER — SODIUM CHLORIDE 0.9 % IV SOLN
2.0000 g | INTRAVENOUS | Status: AC
  Administered 2021-07-07: 2 g via INTRAVENOUS
  Filled 2021-07-06 (×2): qty 2

## 2021-07-06 MED ORDER — NEOMYCIN SULFATE 500 MG PO TABS: 1000.0000 mg | ORAL_TABLET | ORAL | Status: DC

## 2021-07-06 MED ORDER — POLYETHYLENE GLYCOL 3350 17 GM/SCOOP PO POWD: 1.0000 | Freq: Once | ORAL | Status: DC

## 2021-07-06 MED ORDER — ACETAMINOPHEN 500 MG PO TABS: 1000.0000 mg | ORAL_TABLET | ORAL | Status: DC

## 2021-07-06 MED ORDER — ASCORBIC ACID 500 MG PO TABS
500.0000 mg | ORAL_TABLET | Freq: Every day | ORAL | Status: DC
  Administered 2021-07-06 – 2021-07-10 (×4): 500 mg via ORAL
  Filled 2021-07-06 (×4): qty 1

## 2021-07-06 MED ORDER — BISACODYL 5 MG PO TBEC
20.0000 mg | DELAYED_RELEASE_TABLET | Freq: Once | ORAL | Status: AC
  Administered 2021-07-06: 20 mg via ORAL

## 2021-07-06 MED ORDER — METRONIDAZOLE 500 MG PO TABS
1000.0000 mg | ORAL_TABLET | ORAL | Status: AC
  Administered 2021-07-06 (×3): 1000 mg via ORAL
  Filled 2021-07-06 (×3): qty 2

## 2021-07-06 MED ORDER — CLINDAMYCIN PHOSPHATE 900 MG/50ML IV SOLN: 900.0000 mg | INTRAVENOUS | Status: DC

## 2021-07-06 MED ORDER — ENSURE PRE-SURGERY PO LIQD
592.0000 mL | Freq: Once | ORAL | Status: AC
  Administered 2021-07-06: 592 mL via ORAL
  Filled 2021-07-06: qty 592

## 2021-07-06 MED ORDER — POLYETHYLENE GLYCOL 3350 17 GM/SCOOP PO POWD
1.0000 | Freq: Once | ORAL | Status: AC
  Administered 2021-07-06: 255 g via ORAL
  Filled 2021-07-06: qty 255

## 2021-07-06 MED ORDER — BUPIVACAINE LIPOSOME 1.3 % IJ SUSP: 20.0000 mL | Freq: Once | INTRAMUSCULAR | Status: DC

## 2021-07-06 MED ORDER — NEOMYCIN SULFATE 500 MG PO TABS
1000.0000 mg | ORAL_TABLET | ORAL | Status: AC
  Administered 2021-07-06: 1000 mg via ORAL
  Filled 2021-07-06 (×5): qty 2

## 2021-07-06 MED ORDER — GUAIFENESIN-DM 100-10 MG/5ML PO SYRP
10.0000 mL | ORAL_SOLUTION | ORAL | Status: DC | PRN
  Administered 2021-07-06: 10 mL via ORAL
  Filled 2021-07-06 (×2): qty 10

## 2021-07-06 MED ORDER — ENSURE PRE-SURGERY PO LIQD: 296.0000 mL | Freq: Once | ORAL | Status: DC

## 2021-07-06 MED ORDER — METRONIDAZOLE 500 MG PO TABS: 1000.0000 mg | ORAL_TABLET | ORAL | Status: DC

## 2021-07-06 MED ORDER — BISACODYL 5 MG PO TBEC
20.0000 mg | DELAYED_RELEASE_TABLET | Freq: Once | ORAL | Status: AC
  Administered 2021-07-06: 20 mg via ORAL
  Filled 2021-07-06: qty 4

## 2021-07-06 MED ORDER — ENSURE PRE-SURGERY PO LIQD
296.0000 mL | Freq: Once | ORAL | Status: DC
  Filled 2021-07-06: qty 296

## 2021-07-06 MED ORDER — SODIUM CHLORIDE 0.9 % IV SOLN
100.0000 mg | Freq: Every day | INTRAVENOUS | Status: AC
  Administered 2021-07-07 – 2021-07-10 (×4): 100 mg via INTRAVENOUS
  Filled 2021-07-06 (×4): qty 20

## 2021-07-06 MED ORDER — ENSURE PRE-SURGERY PO LIQD: 592.0000 mL | ORAL | Status: DC

## 2021-07-06 MED ORDER — FENTANYL CITRATE PF 50 MCG/ML IJ SOSY
12.5000 ug | PREFILLED_SYRINGE | INTRAMUSCULAR | Status: DC | PRN
  Administered 2021-07-06: 12.5 ug via INTRAVENOUS
  Administered 2021-07-06 – 2021-07-07 (×3): 25 ug via INTRAVENOUS
  Filled 2021-07-06 (×4): qty 1

## 2021-07-06 MED ORDER — ALVIMOPAN 12 MG PO CAPS: 12.0000 mg | ORAL_CAPSULE | ORAL | Status: AC

## 2021-07-06 MED ORDER — BUPIVACAINE LIPOSOME 1.3 % IJ SUSP: 20.0000 mL | INTRAMUSCULAR | Status: DC

## 2021-07-06 MED ORDER — ENOXAPARIN SODIUM 40 MG/0.4ML IJ SOSY: 40.0000 mg | PREFILLED_SYRINGE | Freq: Once | INTRAMUSCULAR | Status: DC

## 2021-07-06 MED ORDER — PSYLLIUM 95 % PO PACK
1.0000 | PACK | Freq: Two times a day (BID) | ORAL | Status: DC
  Administered 2021-07-06: 1 via ORAL
  Filled 2021-07-06 (×2): qty 1

## 2021-07-06 MED ORDER — IPRATROPIUM-ALBUTEROL 20-100 MCG/ACT IN AERS
1.0000 | INHALATION_SPRAY | Freq: Four times a day (QID) | RESPIRATORY_TRACT | Status: DC
  Administered 2021-07-06 (×2): 1 via RESPIRATORY_TRACT
  Filled 2021-07-06: qty 4

## 2021-07-06 NOTE — Plan of Care (Signed)

## 2021-07-06 NOTE — Progress Notes (Signed)
The patient is injury-free, afebrile, alert, and oriented X 3. Vital signs were within the baseline at the beginning of shift. Pt refused 400 vitals. Pt denies chest pain, SOB, nausea, vomiting, dizziness, signs or symptoms of bleeding or infection or acute changes during this shift. We will continue to monitor and work toward achieving the care plan goals.

## 2021-07-06 NOTE — H&P (View-Only) (Signed)
Patient room with husband.  Nursing just outside room.  Started MiraLAX and bisacodyl for bowel prep.  Seen by Dr. Marylyn Ishihara with internal medicine.  Chest x-ray just done.  Primary view looks rather underwhelming to me.  Awaiting final radiology read.  Patient on room air.  Feeling more comfortable.  Have been able to arrange robotic room to do resection rectopexy tomorrow.  We will continue with that plan unless there are more concerns from medicine.  The anatomy & physiology of the digestive tract was discussed.  The pathophysiology of rectal prolapse was discussed.  Natural history risks without surgery was discussed.   I feel the risks of no intervention will lead to serious problems that outweigh the operative risks; therefore, I recommended surgery to treat the pathology.  Possible need for sigmoid colectomy to remove redundant colon was discussed.  Pexy by suture and probable mesh reinforcement was discussed as well.  Laparoscopic & open techniques were discussed.   Risks such as bleeding, infection, abscess, leak, reoperation, possible ostomy, hernia, stroke, heart attack, death, and other risks were discussed.   I noted a good likelihood this will help address the problem.  Goals of post-operative recovery were discussed as well.  We will work to minimize complications.  An educational handout on the technique was given as well.  Questions were answered.  The patient & husband express understanding & wish to proceed with surgery.   Emily Hector, MD, FACS, MASCRS Esophageal, Gastrointestinal & Colorectal Surgery Robotic and Minimally Invasive Surgery  [facility], [place]  [place]. [address] [place], [place] [address] [phone] Fax [phone] Main  CONTACT INFORMATION:  Weekday (9AM-5PM): Call [facility] main office at [phone]  Weeknight (5PM-9AM) or Weekend/Holiday: Check [email or web address] (password " [email or web address]") for General  Surgery [facility] coverage  (Please, do not use SecureChat as it is not reliable communication to operating surgeons for immediate patient care)

## 2021-07-06 NOTE — Consult Note (Signed)
Medical Consultation  Emily Park VVO:[id number] DOB: 22-Oct-1970 DOA: 07/04/2021 PCP: [name], MD   Requesting physician: Dr. Johney Maine Date of consultation: 07/06/21 Reason for consultation: COVID  Impression/Recommendations COVID 19 Infection     - check CXR     - she was negative for COVID w/ a home test last week and a test at urgent care on 11/4; she is now positive 11/7 for COVID     - she has had problems w/ N/V, so we will treat her with remdes right now and inhalers; holding on steroids for right now     - IS, guaifenesin     - check CXR     - O2 as necessary  Remainder per primary team.  [facility] will follow-up again tomorrow. Please contact me if I can be of assistance in the meanwhile. Thank you for this consultation.  Chief Complaint: cough  HPI: Emily Park is a 50 y.o. female with medical history significant of IDA, chronic constipation, GERD, rectal prolapse. Presenting w/ rectal prolapse. Admitted to surgical service for correction. During w/u it was noted that she was positive for COVID. Patient reports that she has had cough and achiness for the last week or so. She tried robitussin at home and it didn't help. Since she felt poor, she decided to take a COVID test. It was negative. Her symptoms continued and so 5 days ago, she went to urgent care. They check her for COVID and flu. She was again negative and was sent home with symptomatic care. She presented to the surgery service 2 days ago for her prolapse. It was noted that she was COVID positive at the time. She continued to have cough and episodes of hypoxia. TR[facility] was consulted for COVID recs.    Review of Systems:  Denies CP, fevers, diarrhea, sick contacts, syncopal episodes, abdominal pain. Reports that she had N/V, dyspnea, productive cough. Remainder of ROS is negative for all not mentioned in HPI.   Past Medical History:  Diagnosis Date   Abnormal TSH    ADD (attention deficit disorder)    Anemia     Anxiety    Arrhythmia    Decreased libido    Depression    Heart murmur    Hyperlipidemia    Hypokalemia    Migraine    Nephrolithiasis    Pap smear abnormality of cervix/human papillomavirus (HPV) positive    Sleep difficulties    SVT (supraventricular tachycardia) (HCC)    Past Surgical History:  Procedure Laterality Date   ABLATION     Cardiac ablation, endometrial ablation   BREAST LUMPECTOMY     right breast   BREAST LUMPECTOMY Right    CARDIOVERSION     COLONOSCOPY WITH PROPOFOL N/A 05/16/2021   Procedure: COLONOSCOPY WITH PROPOFOL;  Surgeon: Wilford Corner, MD;  Location: [facility];  Service: Endoscopy;  Laterality: N/A;   COLPOSCOPY     w/ cervical biopsy   DILITATION & CURRETTAGE/HYSTROSCOPY WITH NOVASURE ABLATION N/A 09/11/2014   Procedure: DILATATION & CURETTAGE/HYSTEROSCOPY WITH NOVASURE ABLATION;  Surgeon: Lovenia Kim, MD;  Location: [facility];  Service: Gynecology;  Laterality: N/A;   GIVENS CAPSULE STUDY N/A 05/16/2021   Procedure: GIVENS CAPSULE STUDY;  Surgeon: Wilford Corner, MD;  [facility]: [facility];  Service: Endoscopy;  Laterality: N/A;   Social History:  reports that she has never smoked. She has been exposed to tobacco smoke. She has never used smokeless tobacco. She reports that she does not currently use  alcohol after a past usage of about 2.0 standard drinks per week. She reports that she does not currently use drugs.  Allergies  Allergen Reactions   Amoxicillin Rash   Bactrim [Sulfamethoxazole-Trimethoprim] Rash    Rash noted to chest and back   Zithromax [Azithromycin] Rash   Family History  Problem Relation Age of Onset   Hypothyroidism Mother    Depression Mother    Cancer Mother        thyroid   Thyroid disease Mother    Hypertension Father    Heart attack Father        Age 29   AAA (abdominal aortic aneurysm) Father    Hypothyroidism Sister    Hyperthyroidism Sister    Diabetes Maternal Grandfather    Breast cancer  Paternal Grandmother     Prior to Admission medications   Medication Sig Start Date End Date Taking? Authorizing Provider  acetaminophen (TYLENOL) 500 MG tablet Take 1,000 mg by mouth every 6 (six) hours as needed for fever (pain).   Yes [provider]  amphetamine-dextroamphetamine (ADDERALL) 30 MG tablet Take 0.5 tablets by mouth every morning. 06/21/21  Yes [provider]  Ascorbic Acid (VITAMIN C PO) Take 1 tablet by mouth every morning.   Yes [provider]  CALCIUM PO Take 1 tablet by mouth every morning.   Yes [provider]  Cholecalciferol (VITAMIN D3 PO) Take 1 tablet by mouth every morning.   Yes [provider]  clonazePAM (KLONOPIN) 1 MG tablet Take 1 mg by mouth at bedtime. 01/20/21  Yes [provider]  doxepin (SINEQUAN) 75 MG capsule Take 75 mg by mouth at bedtime. 12/22/20  Yes [provider]  FLUoxetine (PROZAC) 20 MG capsule Take 60 mg by mouth every morning. 04/01/21  Yes [provider]  ibuprofen (ADVIL) 200 MG tablet Take 800 mg by mouth every 6 (six) hours as needed for fever (pain).   Yes [provider]  Menaquinone-7 (K2 PO) Take 1 tablet by mouth every morning.   Yes [provider]  Multiple Vitamin (MULTIVITAMIN WITH MINERALS) TABS tablet Take 1 tablet by mouth every morning.   Yes [provider]  NIACIN PO Take 1 tablet by mouth every morning.   Yes [provider]  pantoprazole (PROTONIX) 40 MG tablet Take 40 mg by mouth 2 (two) times daily. 04/08/21  Yes [provider]  potassium chloride SA (KLOR-CON) 20 MEQ tablet Take 2 tablets (40 mEq total) by mouth daily. Patient taking differently: Take 40 mEq by mouth every morning. 02/15/21  Yes Pfeiffer, Jeannie Done, MD  promethazine (PHENERGAN) 25 MG tablet Take 1 tablet (25 mg total) by mouth every 6 (six) hours as needed for nausea or vomiting. 02/15/21  Yes Pfeiffer, Jeannie Done, MD  sucralfate (CARAFATE) 1 g  tablet Take 1 g by mouth 2 (two) times daily. 04/28/21  Yes [provider]  topiramate (TOPAMAX) 50 MG tablet Take 150 mg by mouth every morning. 01/11/21  Yes [provider]  TURMERIC PO Take 1 capsule by mouth every morning.   Yes [provider]  linaclotide (LINZESS) 290 MCG CAPS capsule Take 1 capsule (290 mcg total) by mouth daily before breakfast. Patient not taking: No sig reported 05/17/21   Caren Griffins, MD  metoCLOPramide (REGLAN) 5 MG tablet Take 5 mg by mouth 2 (two) times daily as needed for nausea or vomiting. Patient not taking: Reported on 07/04/2021 04/28/21   [provider]  ondansetron (ZOFRAN-ODT) 8  MG disintegrating tablet Take 8 mg by mouth every 8 (eight) hours as needed for nausea or vomiting. Patient not taking: Reported on 07/04/2021    [provider]   Physical Exam: Blood pressure 103/69, pulse 99, temperature 98.1 F (36.7 C), temperature source Axillary, resp. rate 20, height 5\' 4"  (1.626 m), weight 59 kg, SpO2 97 %. Vitals:   07/05/21 2121 07/05/21 2258  BP: 110/73 103/69  Pulse: 98 99  Resp: 20 20  Temp: 98.4 F (36.9 C) 98.1 F (36.7 C)  SpO2: 98% 97%    General: 50 y.o. female resting in bed in NAD Eyes: PERRL, normal sclera ENMT: Nares patent w/o discharge, orophaynx clear, dentition normal, ears w/o discharge/lesions/ulcers Neck: Supple, trachea midline Cardiovascular: RRR, +S1, S2, no m/g/r, equal pulses throughout Respiratory: soft rhonchi at right base, normal WOB on RA, no w/r GI: BS+, NDNT, no masses noted, no organomegaly noted MSK: No e/c/c Skin: No rashes, bruises, ulcerations noted Neuro: A&O x 3, no focal deficits Psyc: Appropriate interaction and affect, calm/cooperative  Labs on Admission:  Basic Metabolic Panel: Recent Labs  Lab 07/04/21 1718  NA 138  K 3.1*  CL 107  CO2 24  GLUCOSE 95  BUN 7  CREATININE 0.82  CALCIUM 8.5*   Liver Function Tests: Recent Labs  Lab  07/04/21 1718  AST 23  ALT 16  ALKPHOS 61  BILITOT 0.5  PROT 6.9  ALBUMIN 3.5   No results for input(s): LIPASE, AMYLASE in the last 168 hours. No results for input(s): AMMONIA in the last 168 hours. CBC: Recent Labs  Lab 07/04/21 1718  WBC 11.3*  NEUTROABS 7.8*  HGB 10.9*  HCT 33.5*  MCV 88.4  PLT 321   Cardiac Enzymes: No results for input(s): CKTOTAL, CKMB, CKMBINDEX, TROPONINI in the last 168 hours. BNP: Invalid input(s): POCBNP CBG: No results for input(s): GLUCAP in the last 168 hours.  Radiological Exams on Admission: No results found.  EKG: None obtained by primary team.  Time spent: 45 minutes  [facility]  If 7PM-7AM, please contact night-coverage [email or web address] 07/06/2021, 12:12 PM

## 2021-07-06 NOTE — Progress Notes (Signed)
Patient room with husband.  Nursing just outside room.  Started MiraLAX and bisacodyl for bowel prep.  Seen by Dr. Marylyn Ishihara with internal medicine.  Chest x-ray just done.  Primary view looks rather underwhelming to me.  Awaiting final radiology read.  Patient on room air.  Feeling more comfortable.  Have been able to arrange robotic room to do resection rectopexy tomorrow.  We will continue with that plan unless there are more concerns from medicine.  The anatomy & physiology of the digestive tract was discussed.  The pathophysiology of rectal prolapse was discussed.  Natural history risks without surgery was discussed.   I feel the risks of no intervention will lead to serious problems that outweigh the operative risks; therefore, I recommended surgery to treat the pathology.  Possible need for sigmoid colectomy to remove redundant colon was discussed.  Pexy by suture and probable mesh reinforcement was discussed as well.  Laparoscopic & open techniques were discussed.   Risks such as bleeding, infection, abscess, leak, reoperation, possible ostomy, hernia, stroke, heart attack, death, and other risks were discussed.   I noted a good likelihood this will help address the problem.  Goals of post-operative recovery were discussed as well.  We will work to minimize complications.  An educational handout on the technique was given as well.  Questions were answered.  The patient & husband express understanding & wish to proceed with surgery.   Adin Hector, MD, FACS, MASCRS Esophageal, Gastrointestinal & Colorectal Surgery Robotic and Minimally Invasive Surgery  [facility], [place]  [place]. [address], [place], [place] [address] [phone] Fax [phone] Main  CONTACT INFORMATION:  Weekday (9AM-5PM): Call [facility] main office at [phone]  Weeknight (5PM-9AM) or Weekend/Holiday: Check [email or web address] (password " [email or web address]") for General  Surgery [facility] coverage  (Please, do not use SecureChat as it is not reliable communication to operating surgeons for immediate patient care)

## 2021-07-06 NOTE — Progress Notes (Signed)
Pt refused vitals, the hospitalist on call was notified.

## 2021-07-06 NOTE — Progress Notes (Signed)
Patient ID: Emily Park, female   DOB: 1971-07-30, 50 y.o.   MRN: [id number] [facility] Surgery Progress Note     Subjective: CC-  Patient comfortable this morning. She vomited once last night but states that it was after a coughing spell. Denies CP or SOB. Denies abdominal pain, n/v this morning. Passing flatus, no BM.   After I saw her once this morning I was paged by her nurse that rectal prolapse recurred.  Objective: Vital signs in last 24 hours: Temp:  [98.1 F (36.7 C)-98.7 F (37.1 C)] 98.1 F (36.7 C) (11/08 2258) Pulse Rate:  [98-106] 99 (11/08 2258) Resp:  [18-20] 20 (11/08 2258) BP: (103-128)/(69-85) 103/69 (11/08 2258) SpO2:  [90 %-99 %] 97 % (11/08 2258) Last BM Date: 06/30/21  Intake/Output from previous day: 11/08 0701 - 11/09 0700 In: 1139.6 [I.V.:1139.6] Out: -  Intake/Output this shift: Total I/O In: 34 [P.O.:590] Out: -   PE: Gen:  Alert, NAD Card:  RRR Pulm:  rate and effort normal on room air Abd: Soft, NT/ND GU: rectum prolapsed >> easily reduced  Lab Results:  Recent Labs    07/04/21 1718  WBC 11.3*  HGB 10.9*  HCT 33.5*  PLT 321   BMET Recent Labs    07/04/21 1718  NA 138  K 3.1*  CL 107  CO2 24  GLUCOSE 95  BUN 7  CREATININE 0.82  CALCIUM 8.5*   PT/INR No results for input(s): LABPROT, INR in the last 72 hours. CMP     Component Value Date/Time   NA 138 07/04/2021 1718   K 3.1 (L) 07/04/2021 1718   CL 107 07/04/2021 1718   CO2 24 07/04/2021 1718   GLUCOSE 95 07/04/2021 1718   BUN 7 07/04/2021 1718   CREATININE 0.82 07/04/2021 1718   CREATININE 1.49 (H) 05/13/2021 1434   CALCIUM 8.5 (L) 07/04/2021 1718   PROT 6.9 07/04/2021 1718   ALBUMIN 3.5 07/04/2021 1718   AST 23 07/04/2021 1718   AST 19 05/13/2021 1434   ALT 16 07/04/2021 1718   ALT 11 05/13/2021 1434   ALKPHOS 61 07/04/2021 1718   BILITOT 0.5 07/04/2021 1718   BILITOT 0.3 05/13/2021 1434   GFRNONAA >60 07/04/2021 1718   GFRNONAA 42 (L)  05/13/2021 1434   GFRAA >60 11/14/2015 0533   Lipase     Component Value Date/Time   LIPASE 39 04/13/2021 1155       Studies/Results: No results found.  Anti-infectives: Anti-infectives (From admission, onward)    None        Assessment/Plan Recurrent rectal prolapse - Rectum easily reduced this morning.  Appreciate GI recommendations: Linzess 290 mcg once a day along with MiraLAX 17 g twice a day and will add Metamucil 1 packet twice a day Patient ultimately will need surgery for this, but would like to optimize her for better chance of having a good outcome with surgery. GI assisting for stabilization of her bowels. She is also covid positive which increases her risks with being put to sleep for a non-emergent surgery. Will discuss ideal timing of surgery with MD.   ID - none FEN - IVF, soft diet VTE - scds, sq heparin Foley - none   COVID+  - will ask TR[facility] to consult ADHD Migraines Anxiety/depression Hx arrhythmia s/p ablation Gastroparesis GERD Hiatal hernia PUD   LOS: 0 days    Wellington Hampshire, [facility] Surgery 07/06/2021, 10:53 AM Please see Amion for pager number during day hours  7:00am-4:30pm

## 2021-07-06 NOTE — Progress Notes (Signed)
[facility] Gastroenterology Progress Note  Emily Park 50 y.o. [birth year]  CC:  rectal prolapse, constipation, gastroparesis  Subjective: Patient states her breathing has improved, she is not requiring oxygen today.  Continues to have productive cough.  Denies chest pain, fever, chills. Patient was able to eat her breakfast this morning, soft foods, denies nausea or vomiting. Patient does have bloating. Patient still has not had a bowel movement, started on Linzess, MiraLAX and Benefiber.  States she is passing gas, currently has the feeling she will need to have a bowel movement. Denies abdominal pain, hematochezia, melena. Patient states she was urinating this morning and did have another episode of rectal prolapse, this was reduced manually.  ROS :  Review of Systems  Constitutional:  Negative for chills and fever.  Respiratory:  Positive for cough and sputum production. Negative for shortness of breath.   Cardiovascular:  Negative for chest pain and leg swelling.  Gastrointestinal:  Positive for constipation. Negative for abdominal pain, blood in stool, diarrhea, heartburn, melena, nausea and vomiting.   Objective: Vital signs in last 24 hours: Vitals:   07/05/21 2121 07/05/21 2258  BP: 110/73 103/69  Pulse: 98 99  Resp: 20 20  Temp: 98.4 F (36.9 C) 98.1 F (36.7 C)  SpO2: 98% 97%    Physical Exam: General:   Alert, in NAD Heart:  Regular rate and rhythm; no murmurs Pulm: Clear anteriorly; no wheezing Abdomen:  Soft, Flat AB, skin exam normal, Sluggish bowel sounds. mild tenderness in the lower abdomen. Without guarding and Without rebound, without hepatomegaly. Neurologic:  Alert and  oriented x4;  grossly normal neurologically. Psych:  Alert and cooperative. Normal mood and affect.  Lab Results: Recent Labs    07/04/21 1718  NA 138  K 3.1*  CL 107  CO2 24  GLUCOSE 95  BUN 7  CREATININE 0.82  CALCIUM 8.5*   Recent Labs    07/04/21 1718  AST 23  ALT 16   ALKPHOS 61  BILITOT 0.5  PROT 6.9  ALBUMIN 3.5   Recent Labs    07/04/21 1718  WBC 11.3*  NEUTROABS 7.8*  HGB 10.9*  HCT 33.5*  MCV 88.4  PLT 321   No results for input(s): LABPROT, INR in the last 72 hours.  Lab Results: Results for orders placed or performed during the hospital encounter of 07/04/21 (from the past 48 hour(s))  Comprehensive metabolic panel     Status: Abnormal   Collection Time: 07/04/21  5:18 PM  Result Value Ref Range   Sodium 138 135 - 145 mmol/L   Potassium 3.1 (L) 3.5 - 5.1 mmol/L   Chloride 107 98 - 111 mmol/L   CO2 24 22 - 32 mmol/L   Glucose, Bld 95 70 - 99 mg/dL    Comment: Glucose reference range applies only to samples taken after fasting for at least 8 hours.   BUN 7 6 - 20 mg/dL   Creatinine, Ser 0.82 0.44 - 1.00 mg/dL   Calcium 8.5 (L) 8.9 - 10.3 mg/dL   Total Protein 6.9 6.5 - 8.1 g/dL   Albumin 3.5 3.5 - 5.0 g/dL   AST 23 15 - 41 U/L   ALT 16 0 - 44 U/L   Alkaline Phosphatase 61 38 - 126 U/L   Total Bilirubin 0.5 0.3 - 1.2 mg/dL   GFR, Estimated >60 >60 mL/min    Comment: (NOTE) Calculated using the CKD-EPI Creatinine Equation (2021)    Anion gap 7 5 - 15  Comment: Performed at [facility], [place] [address], [place], [place] [address]  CBC with Differential     Status: Abnormal   Collection Time: 07/04/21  5:18 PM  Result Value Ref Range   WBC 11.3 (H) 4.0 - 10.5 K/uL   RBC 3.79 (L) 3.87 - 5.11 MIL/uL   Hemoglobin 10.9 (L) 12.0 - 15.0 g/dL   HCT 33.5 (L) 36.0 - 46.0 %   MCV 88.4 80.0 - 100.0 fL   MCH 28.8 26.0 - 34.0 pg   MCHC 32.5 30.0 - 36.0 g/dL   RDW 21.8 (H) 11.5 - 15.5 %   Platelets 321 150 - 400 K/uL   nRBC 0.0 0.0 - 0.2 %   Neutrophils Relative % 70 %   Neutro Abs 7.8 (H) 1.7 - 7.7 K/uL   Lymphocytes Relative 21 %   Lymphs Abs 2.4 0.7 - 4.0 K/uL   Monocytes Relative 5 %   Monocytes Absolute 0.6 0.1 - 1.0 K/uL   Eosinophils Relative 4 %   Eosinophils Absolute 0.5 0.0 - 0.5 K/uL   Basophils  Relative 0 %   Basophils Absolute 0.0 0.0 - 0.1 K/uL   WBC Morphology TOXIC GRANULATION    Immature Granulocytes 0 %   Abs Immature Granulocytes 0.05 0.00 - 0.07 K/uL   Ovalocytes PRESENT     Comment: Performed at [facility], [place] [address] [place], [place] [address]  Resp Panel by RT-PCR (Flu A&B, Covid) Nasopharyngeal Swab     Status: Abnormal   Collection Time: 07/04/21  7:30 PM   Specimen: Nasopharyngeal Swab; Nasopharyngeal(NP) swabs in vial transport medium  Result Value Ref Range   SARS Coronavirus 2 by RT PCR POSITIVE (A) NEGATIVE    Comment: CRITICAL RESULT CALLED TO, READ BACK BY AND VERIFIED WITH:  Clarene Reamer RN 07/04/21 @ 2240 VS (NOTE) SARS-CoV-2 target nucleic acids are DETECTED.  The SARS-CoV-2 RNA is generally detectable in upper respiratory specimens during the acute phase of infection. Positive results are indicative of the presence of the identified virus, but do not rule out bacterial infection or co-infection with other pathogens not detected by the test. Clinical correlation with patient history and other diagnostic information is necessary to determine patient infection status. The expected result is Negative.  Fact Sheet for Patients: EntrepreneurPulse.com.au  Fact Sheet for Healthcare Providers: IncredibleEmployment.be  This test is not yet approved or cleared by the [place] FDA and  has been authorized for detection and/or diagnosis of SARS-CoV-2 by FDA under an Emergency Use Authorization (EUA).  This EUA will remain in effect (meaning th is test can be used) for the duration of  the COVID-19 declaration under Section 564(b)(1) of the Act, 21 U.S.C. section 360bbb-3(b)(1), unless the authorization is terminated or revoked sooner.     Influenza A by PCR NEGATIVE NEGATIVE   Influenza B by PCR NEGATIVE NEGATIVE    Comment: (NOTE) The Xpert Xpress SARS-CoV-2/FLU/RSV plus assay is  intended as an aid in the diagnosis of influenza from Nasopharyngeal swab specimens and should not be used as a sole basis for treatment. Nasal washings and aspirates are unacceptable for Xpert Xpress SARS-CoV-2/FLU/RSV testing.  Fact Sheet for Patients: EntrepreneurPulse.com.au  Fact Sheet for Healthcare Providers: IncredibleEmployment.be  This test is not yet approved or cleared by the [place] FDA and has been authorized for detection and/or diagnosis of SARS-CoV-2 by FDA under an Emergency Use Authorization (EUA). This EUA will remain in effect (meaning this test can be used)  for the duration of the COVID-19 declaration under Section 564(b)(1) of the Act, 21 U.S.C. section 360bbb-3(b)(1), unless the authorization is terminated or revoked.  Performed at [facility], [place] [address], [place], [place] [address]     Impression Rectal prolapse             colonoscopy 05/16/2021 for anemia showed internal hemorrhoids, otherwise normal Constipation Denies diarrhea, can have mucus with flatulence and very rare loose stools Was off her medication for 1 month, states she requested refill but it was not refilled. Was having a bowel movement 2-3 times a week with outpatient regiment Gastroparesis GERD with large hiatal hernia, history of PUD COVID    Plan General surgery consulted Discussed need for improved bowel regiment to prevent constipation and straining in the future. Continue Linzess 290 mcg once daily, MiraLAX 17 g twice daily and Benefiber 1 tablespoon twice daily.  We will still consider switching to or adding on [employer] outpatient if continuing constipation. Try to minimize opioid use if possible for history of constipation and gastroparesis Soft diet for gastroparesis, can do liquid diet for flares.  Pending gastroparesis study. Would likely benefit from referral to [facility] utility specialist Protonix 40  mg twice daily, Carafate as needed COVID-positive this visit, being followed by primary team.   Vladimir Crofts PA-C 07/06/2021, 9:46 AM  Contact #  [phone]

## 2021-07-07 ENCOUNTER — Encounter (HOSPITAL_COMMUNITY): Admission: EM | Disposition: A | Discharge status: Home / Self Care | Payer: Self-pay | Source: Home / Self Care

## 2021-07-07 ENCOUNTER — Inpatient Hospital Stay (HOSPITAL_COMMUNITY): Payer: Commercial Managed Care - PPO | Admitting: Anesthesiology

## 2021-07-07 DIAGNOSIS — K623 Rectal prolapse: Secondary | ICD-10-CM | POA: Diagnosis not present

## 2021-07-07 DIAGNOSIS — J1282 Pneumonia due to coronavirus disease 2019: Secondary | ICD-10-CM | POA: Diagnosis not present

## 2021-07-07 DIAGNOSIS — U071 COVID-19: Secondary | ICD-10-CM | POA: Diagnosis not present

## 2021-07-07 HISTORY — PX: XI ROBOT ASSISTED RECTOPEXY: SHX6788

## 2021-07-07 HISTORY — PX: XI ROBOTIC ASSISTED LOWER ANTERIOR RESECTION: SHX6558

## 2021-07-07 LAB — CBC WITH DIFFERENTIAL/PLATELET
Abs Immature Granulocytes: 0.04 10*3/uL (ref 0.00–0.07)
Basophils Absolute: 0 10*3/uL (ref 0.0–0.1)
Basophils Relative: 1 %
Eosinophils Absolute: 0.3 10*3/uL (ref 0.0–0.5)
Eosinophils Relative: 8 %
HCT: 30.5 % — ABNORMAL LOW (ref 36.0–46.0)
Hemoglobin: 9.4 g/dL — ABNORMAL LOW (ref 12.0–15.0)
Immature Granulocytes: 1 %
Lymphocytes Relative: 32 %
Lymphs Abs: 1.3 10*3/uL (ref 0.7–4.0)
MCH: 27.9 pg (ref 26.0–34.0)
MCHC: 30.8 g/dL (ref 30.0–36.0)
MCV: 90.5 fL (ref 80.0–100.0)
Monocytes Absolute: 0.6 10*3/uL (ref 0.1–1.0)
Monocytes Relative: 15 %
Neutro Abs: 1.7 10*3/uL (ref 1.7–7.7)
Neutrophils Relative %: 43 %
Platelets: 286 10*3/uL (ref 150–400)
RBC: 3.37 MIL/uL — ABNORMAL LOW (ref 3.87–5.11)
RDW: 22.2 % — ABNORMAL HIGH (ref 11.5–15.5)
WBC: 3.9 10*3/uL — ABNORMAL LOW (ref 4.0–10.5)
nRBC: 0 % (ref 0.0–0.2)

## 2021-07-07 LAB — COMPREHENSIVE METABOLIC PANEL
ALT: 13 U/L (ref 0–44)
AST: 20 U/L (ref 15–41)
Albumin: 2.5 g/dL — ABNORMAL LOW (ref 3.5–5.0)
Alkaline Phosphatase: 50 U/L (ref 38–126)
Anion gap: 6 (ref 5–15)
BUN: 6 mg/dL (ref 6–20)
CO2: 23 mmol/L (ref 22–32)
Calcium: 7.9 mg/dL — ABNORMAL LOW (ref 8.9–10.3)
Chloride: 111 mmol/L (ref 98–111)
Creatinine, Ser: 0.76 mg/dL (ref 0.44–1.00)
GFR, Estimated: >60 mL/min (ref >60)
Glucose, Bld: 102 mg/dL — ABNORMAL HIGH (ref 70–99)
Potassium: 2.9 mmol/L — ABNORMAL LOW (ref 3.5–5.1)
Sodium: 140 mmol/L (ref 135–145)
Total Bilirubin: 0.2 mg/dL — ABNORMAL LOW (ref 0.3–1.2)
Total Protein: 5.4 g/dL — ABNORMAL LOW (ref 6.5–8.1)

## 2021-07-07 LAB — PROCALCITONIN: Procalcitonin: <0.1 ng/mL

## 2021-07-07 LAB — TYPE AND SCREEN
ABO/RH(D): O POS
Antibody Screen: NEGATIVE

## 2021-07-07 LAB — FERRITIN: Ferritin: 119 ng/mL (ref 11–307)

## 2021-07-07 LAB — MAGNESIUM: Magnesium: 1.7 mg/dL (ref 1.7–2.4)

## 2021-07-07 LAB — C-REACTIVE PROTEIN: CRP: 1.8 mg/dL — ABNORMAL HIGH (ref <1.0)

## 2021-07-07 LAB — D-DIMER, QUANTITATIVE: D-Dimer, Quant: 1.2 ug/mL-FEU — ABNORMAL HIGH (ref 0.00–0.50)

## 2021-07-07 LAB — PHOSPHORUS: Phosphorus: 2.4 mg/dL — ABNORMAL LOW (ref 2.5–4.6)

## 2021-07-07 SURGERY — RECTOPEXY, ROBOT-ASSISTED
Anesthesia: General

## 2021-07-07 MED ORDER — ALUM & MAG HYDROXIDE-SIMETH 200-200-20 MG/5ML PO SUSP: 30.0000 mL | Freq: Four times a day (QID) | ORAL | Status: DC | PRN

## 2021-07-07 MED ORDER — MIDAZOLAM HCL 2 MG/2ML IJ SOLN
INTRAMUSCULAR | Status: AC
  Filled 2021-07-07: qty 2

## 2021-07-07 MED ORDER — ONDANSETRON HCL 4 MG/2ML IJ SOLN
INTRAMUSCULAR | Status: DC | PRN
  Administered 2021-07-07: 4 mg via INTRAVENOUS

## 2021-07-07 MED ORDER — POTASSIUM CHLORIDE 10 MEQ/100ML IV SOLN: 10.0000 meq | INTRAVENOUS | Status: DC

## 2021-07-07 MED ORDER — POTASSIUM CHLORIDE 10 MEQ/100ML IV SOLN
10.0000 meq | INTRAVENOUS | Status: AC
  Administered 2021-07-07 (×3): 10 meq via INTRAVENOUS
  Filled 2021-07-07: qty 100

## 2021-07-07 MED ORDER — SUCCINYLCHOLINE CHLORIDE 200 MG/10ML IV SOSY
PREFILLED_SYRINGE | INTRAVENOUS | Status: DC | PRN
  Administered 2021-07-07: 100 mg via INTRAVENOUS

## 2021-07-07 MED ORDER — FENTANYL CITRATE (PF) 100 MCG/2ML IJ SOLN
INTRAMUSCULAR | Status: DC | PRN
  Administered 2021-07-07 (×2): 50 ug via INTRAVENOUS
  Administered 2021-07-07 (×2): 100 ug via INTRAVENOUS

## 2021-07-07 MED ORDER — LACTATED RINGERS IV SOLN
INTRAVENOUS | Status: AC | PRN
  Administered 2021-07-07: 1

## 2021-07-07 MED ORDER — SODIUM CHLORIDE 0.9% FLUSH: 3.0000 mL | INTRAVENOUS | Status: DC | PRN

## 2021-07-07 MED ORDER — SPY AGENT GREEN - (INDOCYANINE FOR INJECTION)
INTRAMUSCULAR | Status: DC | PRN
  Administered 2021-07-07: 4 mL via INTRAVENOUS

## 2021-07-07 MED ORDER — HYDROMORPHONE HCL 1 MG/ML IJ SOLN
INTRAMUSCULAR | Status: AC
  Administered 2021-07-07: 0.5 mg via INTRAVENOUS
  Filled 2021-07-07: qty 2

## 2021-07-07 MED ORDER — POTASSIUM CHLORIDE CRYS ER 20 MEQ PO TBCR: 40.0000 meq | EXTENDED_RELEASE_TABLET | Freq: Once | ORAL | Status: DC

## 2021-07-07 MED ORDER — LACTATED RINGERS IV SOLN: INTRAVENOUS | Status: DC | PRN

## 2021-07-07 MED ORDER — MAGIC MOUTHWASH
15.0000 mL | Freq: Four times a day (QID) | ORAL | Status: DC | PRN
  Filled 2021-07-07: qty 15

## 2021-07-07 MED ORDER — SIMETHICONE 80 MG PO CHEW: 40.0000 mg | CHEWABLE_TABLET | Freq: Four times a day (QID) | ORAL | Status: DC | PRN

## 2021-07-07 MED ORDER — PHENYLEPHRINE HCL-NACL 20-0.9 MG/250ML-% IV SOLN
INTRAVENOUS | Status: DC | PRN
  Administered 2021-07-07: 25 ug/min via INTRAVENOUS

## 2021-07-07 MED ORDER — SUGAMMADEX SODIUM 200 MG/2ML IV SOLN
INTRAVENOUS | Status: DC | PRN
  Administered 2021-07-07: 200 mg via INTRAVENOUS

## 2021-07-07 MED ORDER — BUPIVACAINE LIPOSOME 1.3 % IJ SUSP
INTRAMUSCULAR | Status: AC
  Filled 2021-07-07: qty 20

## 2021-07-07 MED ORDER — PROCHLORPERAZINE MALEATE 10 MG PO TABS: 10.0000 mg | ORAL_TABLET | Freq: Four times a day (QID) | ORAL | Status: DC | PRN

## 2021-07-07 MED ORDER — METHOCARBAMOL 1000 MG/10ML IJ SOLN
1000.0000 mg | Freq: Four times a day (QID) | INTRAVENOUS | Status: DC | PRN
  Filled 2021-07-07: qty 10

## 2021-07-07 MED ORDER — NIACIN 100 MG PO TABS
100.0000 mg | ORAL_TABLET | Freq: Every day | ORAL | Status: DC
  Administered 2021-07-07 – 2021-07-09 (×3): 100 mg via ORAL
  Filled 2021-07-07 (×5): qty 1

## 2021-07-07 MED ORDER — ACETAMINOPHEN 500 MG PO TABS
1000.0000 mg | ORAL_TABLET | Freq: Four times a day (QID) | ORAL | Status: DC
  Administered 2021-07-08 – 2021-07-09 (×6): 1000 mg via ORAL
  Filled 2021-07-07 (×9): qty 2

## 2021-07-07 MED ORDER — METHOCARBAMOL 500 MG PO TABS
1000.0000 mg | ORAL_TABLET | Freq: Four times a day (QID) | ORAL | Status: DC | PRN
  Administered 2021-07-09 – 2021-07-10 (×3): 1000 mg via ORAL
  Filled 2021-07-07 (×5): qty 2

## 2021-07-07 MED ORDER — LIP MEDEX EX OINT
1.0000 "application " | TOPICAL_OINTMENT | Freq: Two times a day (BID) | CUTANEOUS | Status: DC
  Administered 2021-07-07 – 2021-07-10 (×6): 1 via TOPICAL
  Filled 2021-07-07 (×2): qty 7

## 2021-07-07 MED ORDER — SODIUM CHLORIDE 0.9 % IV SOLN
2.0000 g | Freq: Two times a day (BID) | INTRAVENOUS | Status: AC
  Administered 2021-07-08: 2 g via INTRAVENOUS
  Filled 2021-07-07: qty 2

## 2021-07-07 MED ORDER — FENTANYL CITRATE (PF) 100 MCG/2ML IJ SOLN
INTRAMUSCULAR | Status: AC
  Filled 2021-07-07: qty 2

## 2021-07-07 MED ORDER — AMPHETAMINE-DEXTROAMPHETAMINE 30 MG PO TABS: 0.5000 | ORAL_TABLET | Freq: Every morning | ORAL | Status: DC

## 2021-07-07 MED ORDER — KETAMINE HCL 10 MG/ML IJ SOLN
INTRAMUSCULAR | Status: DC | PRN
  Administered 2021-07-07: 20 mg via INTRAVENOUS

## 2021-07-07 MED ORDER — DIPHENHYDRAMINE HCL 12.5 MG/5ML PO ELIX: 12.5000 mg | ORAL_SOLUTION | Freq: Four times a day (QID) | ORAL | Status: DC | PRN

## 2021-07-07 MED ORDER — AMPHETAMINE-DEXTROAMPHETAMINE 10 MG PO TABS
15.0000 mg | ORAL_TABLET | Freq: Every day | ORAL | Status: DC
  Administered 2021-07-09: 15 mg via ORAL
  Filled 2021-07-07 (×3): qty 2

## 2021-07-07 MED ORDER — SODIUM CHLORIDE 0.9 % IV SOLN: Freq: Three times a day (TID) | INTRAVENOUS | Status: AC | PRN

## 2021-07-07 MED ORDER — ACETAMINOPHEN 325 MG PO TABS: 650.0000 mg | ORAL_TABLET | Freq: Four times a day (QID) | ORAL | Status: DC | PRN

## 2021-07-07 MED ORDER — SODIUM CHLORIDE (PF) 0.9 % IJ SOLN
INTRAMUSCULAR | Status: AC
  Filled 2021-07-07: qty 10

## 2021-07-07 MED ORDER — BUPIVACAINE-EPINEPHRINE (PF) 0.25% -1:200000 IJ SOLN
INTRAMUSCULAR | Status: DC | PRN
  Administered 2021-07-07: 60 mL via PERINEURAL

## 2021-07-07 MED ORDER — HYDROMORPHONE HCL 1 MG/ML IJ SOLN
0.5000 mg | INTRAMUSCULAR | Status: DC | PRN
  Administered 2021-07-07: 2 mg via INTRAVENOUS
  Administered 2021-07-08 (×3): 1 mg via INTRAVENOUS
  Administered 2021-07-09: 2 mg via INTRAVENOUS
  Administered 2021-07-09: 1 mg via INTRAVENOUS
  Filled 2021-07-07: qty 2
  Filled 2021-07-07 (×4): qty 1
  Filled 2021-07-07: qty 2
  Filled 2021-07-07: qty 1

## 2021-07-07 MED ORDER — ENOXAPARIN SODIUM 40 MG/0.4ML IJ SOSY
PREFILLED_SYRINGE | INTRAMUSCULAR | Status: AC
  Administered 2021-07-08: 40 mg via SUBCUTANEOUS
  Filled 2021-07-07: qty 0.4

## 2021-07-07 MED ORDER — LIDOCAINE 2% (20 MG/ML) 5 ML SYRINGE
INTRAMUSCULAR | Status: DC | PRN
  Administered 2021-07-07: 80 mg via INTRAVENOUS

## 2021-07-07 MED ORDER — PROCHLORPERAZINE EDISYLATE 10 MG/2ML IJ SOLN: 5.0000 mg | Freq: Four times a day (QID) | INTRAMUSCULAR | Status: DC | PRN

## 2021-07-07 MED ORDER — BUPIVACAINE-EPINEPHRINE (PF) 0.25% -1:200000 IJ SOLN
INTRAMUSCULAR | Status: AC
  Filled 2021-07-07: qty 30

## 2021-07-07 MED ORDER — SODIUM CHLORIDE 0.9% FLUSH
3.0000 mL | Freq: Two times a day (BID) | INTRAVENOUS | Status: DC
  Administered 2021-07-07 – 2021-07-10 (×5): 3 mL via INTRAVENOUS

## 2021-07-07 MED ORDER — ADULT MULTIVITAMIN W/MINERALS CH
1.0000 | ORAL_TABLET | Freq: Every morning | ORAL | Status: DC
  Administered 2021-07-08 – 2021-07-10 (×3): 1 via ORAL
  Filled 2021-07-07 (×2): qty 1

## 2021-07-07 MED ORDER — DEXAMETHASONE SODIUM PHOSPHATE 10 MG/ML IJ SOLN
INTRAMUSCULAR | Status: DC | PRN
  Administered 2021-07-07: 10 mg via INTRAVENOUS

## 2021-07-07 MED ORDER — ENOXAPARIN SODIUM 40 MG/0.4ML IJ SOSY
40.0000 mg | PREFILLED_SYRINGE | INTRAMUSCULAR | Status: DC
  Administered 2021-07-09 – 2021-07-10 (×2): 40 mg via SUBCUTANEOUS
  Filled 2021-07-07 (×3): qty 0.4

## 2021-07-07 MED ORDER — SODIUM CHLORIDE 0.9 % IV SOLN: 250.0000 mL | INTRAVENOUS | Status: DC | PRN

## 2021-07-07 MED ORDER — PROPOFOL 10 MG/ML IV BOLUS
INTRAVENOUS | Status: DC | PRN
  Administered 2021-07-07: 120 mg via INTRAVENOUS

## 2021-07-07 MED ORDER — BUPIVACAINE LIPOSOME 1.3 % IJ SUSP
INTRAMUSCULAR | Status: DC | PRN
  Administered 2021-07-07: 20 mL

## 2021-07-07 MED ORDER — ENSURE SURGERY PO LIQD
237.0000 mL | Freq: Two times a day (BID) | ORAL | Status: DC
  Administered 2021-07-08 – 2021-07-10 (×4): 237 mL via ORAL
  Filled 2021-07-07 (×6): qty 237

## 2021-07-07 MED ORDER — DIPHENHYDRAMINE HCL 50 MG/ML IJ SOLN: 12.5000 mg | Freq: Four times a day (QID) | INTRAMUSCULAR | Status: DC | PRN

## 2021-07-07 MED ORDER — HYDROMORPHONE HCL 1 MG/ML IJ SOLN
0.2500 mg | INTRAMUSCULAR | Status: DC | PRN
  Administered 2021-07-07 (×3): 0.5 mg via INTRAVENOUS

## 2021-07-07 MED ORDER — ROCURONIUM BROMIDE 10 MG/ML (PF) SYRINGE
PREFILLED_SYRINGE | INTRAVENOUS | Status: DC | PRN
  Administered 2021-07-07: 10 mg via INTRAVENOUS
  Administered 2021-07-07: 30 mg via INTRAVENOUS
  Administered 2021-07-07: 50 mg via INTRAVENOUS
  Administered 2021-07-07: 20 mg via INTRAVENOUS

## 2021-07-07 MED ORDER — 0.9 % SODIUM CHLORIDE (POUR BTL) OPTIME
TOPICAL | Status: DC | PRN
  Administered 2021-07-07: 2000 mL

## 2021-07-07 MED ORDER — METOPROLOL TARTRATE 5 MG/5ML IV SOLN: 5.0000 mg | Freq: Four times a day (QID) | INTRAVENOUS | Status: DC | PRN

## 2021-07-07 MED ORDER — MIDAZOLAM HCL 5 MG/5ML IJ SOLN
INTRAMUSCULAR | Status: DC | PRN
  Administered 2021-07-07: 2 mg via INTRAVENOUS

## 2021-07-07 MED ORDER — IPRATROPIUM-ALBUTEROL 20-100 MCG/ACT IN AERS
1.0000 | INHALATION_SPRAY | Freq: Three times a day (TID) | RESPIRATORY_TRACT | Status: DC
  Administered 2021-07-07 – 2021-07-10 (×7): 1 via RESPIRATORY_TRACT
  Filled 2021-07-07: qty 4

## 2021-07-07 MED ORDER — POTASSIUM CHLORIDE 10 MEQ/100ML IV SOLN
10.0000 meq | INTRAVENOUS | Status: AC
  Administered 2021-07-07: 10 meq via INTRAVENOUS

## 2021-07-07 MED ORDER — MELATONIN 3 MG PO TABS
3.0000 mg | ORAL_TABLET | Freq: Every evening | ORAL | Status: DC | PRN
  Administered 2021-07-09 (×2): 3 mg via ORAL
  Filled 2021-07-07 (×2): qty 1

## 2021-07-07 MED ORDER — FENTANYL CITRATE (PF) 250 MCG/5ML IJ SOLN
INTRAMUSCULAR | Status: AC
  Filled 2021-07-07: qty 5

## 2021-07-07 MED ORDER — LACTATED RINGERS IV SOLN: INTRAVENOUS | Status: AC

## 2021-07-07 MED ORDER — KETAMINE HCL 10 MG/ML IJ SOLN
INTRAMUSCULAR | Status: AC
  Filled 2021-07-07: qty 1

## 2021-07-07 SURGICAL SUPPLY — 117 items
APL PRP STRL LF DISP 70% ISPRP (MISCELLANEOUS)
APPLIER CLIP 5 13 M/L LIGAMAX5 (MISCELLANEOUS) ×2
APPLIER CLIP ROT 10 11.4 M/L (STAPLE)
APR CLP MED LRG 11.4X10 (STAPLE)
APR CLP MED LRG 5 ANG JAW (MISCELLANEOUS) ×1
BAG COUNTER SPONGE SURGICOUNT (BAG) ×2 IMPLANT
BAG SPNG CNTER NS LX DISP (BAG) ×1
BLADE EXTENDED COATED 6.5IN (ELECTRODE) IMPLANT
CANNULA REDUC XI 12-8 STAPL (CANNULA)
CANNULA REDUCER 12-8 DVNC XI (CANNULA) IMPLANT
CELLS DAT CNTRL 66122 CELL SVR (MISCELLANEOUS) IMPLANT
CHLORAPREP W/TINT 26 (MISCELLANEOUS) IMPLANT
CLIP APPLIE 5 13 M/L LIGAMAX5 (MISCELLANEOUS) IMPLANT
CLIP APPLIE ROT 10 11.4 M/L (STAPLE) IMPLANT
COVER SURGICAL LIGHT HANDLE (MISCELLANEOUS) ×4 IMPLANT
COVER TIP SHEARS 8 DVNC (MISCELLANEOUS) ×1 IMPLANT
COVER TIP SHEARS 8MM DA VINCI (MISCELLANEOUS) ×2
DECANTER SPIKE VIAL GLASS SM (MISCELLANEOUS) ×2 IMPLANT
DEVICE TROCAR PUNCTURE CLOSURE (ENDOMECHANICALS) IMPLANT
DRAIN CHANNEL 19F RND (DRAIN) ×1 IMPLANT
DRAPE ARM DVNC X/XI (DISPOSABLE) ×4 IMPLANT
DRAPE COLUMN DVNC XI (DISPOSABLE) ×1 IMPLANT
DRAPE DA VINCI XI ARM (DISPOSABLE) ×8
DRAPE DA VINCI XI COLUMN (DISPOSABLE) ×2
DRAPE SURG IRRIG POUCH 19X23 (DRAPES) ×2 IMPLANT
DRSG OPSITE POSTOP 4X10 (GAUZE/BANDAGES/DRESSINGS) IMPLANT
DRSG OPSITE POSTOP 4X6 (GAUZE/BANDAGES/DRESSINGS) IMPLANT
DRSG OPSITE POSTOP 4X8 (GAUZE/BANDAGES/DRESSINGS) IMPLANT
DRSG TEGADERM 2-3/8X2-3/4 SM (GAUZE/BANDAGES/DRESSINGS) ×6 IMPLANT
DRSG TEGADERM 4X4.75 (GAUZE/BANDAGES/DRESSINGS) ×1 IMPLANT
ELECT PENCIL ROCKER SW 15FT (MISCELLANEOUS) ×2 IMPLANT
ELECT REM PT RETURN 15FT ADLT (MISCELLANEOUS) ×2 IMPLANT
ENDOLOOP SUT PDS II  0 18 (SUTURE)
ENDOLOOP SUT PDS II 0 18 (SUTURE) IMPLANT
EVACUATOR SILICONE 100CC (DRAIN) ×1 IMPLANT
GAUZE SPONGE 2X2 8PLY STRL LF (GAUZE/BANDAGES/DRESSINGS) ×1 IMPLANT
GLOVE SURG NEOPR MICRO LF SZ8 (GLOVE) ×6 IMPLANT
GLOVE SURG UNDER LTX SZ8 (GLOVE) ×6 IMPLANT
GOWN STRL REUS W/TWL XL LVL3 (GOWN DISPOSABLE) ×6 IMPLANT
GRASPER SUT TROCAR 14GX15 (MISCELLANEOUS) IMPLANT
HOLDER FOLEY CATH W/STRAP (MISCELLANEOUS) ×2 IMPLANT
IRRIG SUCT STRYKERFLOW 2 WTIP (MISCELLANEOUS) ×2
IRRIGATION SUCT STRKRFLW 2 WTP (MISCELLANEOUS) ×1 IMPLANT
KIT PROCEDURE DA VINCI SI (MISCELLANEOUS)
KIT PROCEDURE DVNC SI (MISCELLANEOUS) IMPLANT
KIT SIGMOIDOSCOPE (SET/KITS/TRAYS/PACK) ×1 IMPLANT
KIT TURNOVER KIT A (KITS) IMPLANT
NDL INSUFFLATION 14GA 120MM (NEEDLE) ×1 IMPLANT
NEEDLE INSUFFLATION 14GA 120MM (NEEDLE) ×2 IMPLANT
PACK CARDIOVASCULAR III (CUSTOM PROCEDURE TRAY) ×2 IMPLANT
PACK COLON (CUSTOM PROCEDURE TRAY) ×2 IMPLANT
PAD POSITIONING PINK XL (MISCELLANEOUS) ×2 IMPLANT
PROTECTOR NERVE ULNAR (MISCELLANEOUS) ×4 IMPLANT
RELOAD STAPLE 45 3.5 BLU DVNC (STAPLE) IMPLANT
RELOAD STAPLE 45 4.3 GRN DVNC (STAPLE) IMPLANT
RELOAD STAPLE 60 3.5 BLU DVNC (STAPLE) IMPLANT
RELOAD STAPLE 60 4.3 GRN DVNC (STAPLE) IMPLANT
RELOAD STAPLER 3.5X45 BLU DVNC (STAPLE) IMPLANT
RELOAD STAPLER 3.5X60 BLU DVNC (STAPLE) IMPLANT
RELOAD STAPLER 4.3X45 GRN DVNC (STAPLE) IMPLANT
RELOAD STAPLER 4.3X60 GRN DVNC (STAPLE) ×1 IMPLANT
RETRACTOR WND ALEXIS 18 MED (MISCELLANEOUS) IMPLANT
RTRCTR WOUND ALEXIS 18CM MED (MISCELLANEOUS)
SCISSORS LAP 5X35 DISP (ENDOMECHANICALS) ×2 IMPLANT
SEAL CANN UNIV 5-8 DVNC XI (MISCELLANEOUS) ×3 IMPLANT
SEAL XI 5MM-8MM UNIVERSAL (MISCELLANEOUS) ×8
SEALER VESSEL DA VINCI XI (MISCELLANEOUS) ×2
SEALER VESSEL EXT DVNC XI (MISCELLANEOUS) ×1 IMPLANT
SOLUTION ELECTROLUBE (MISCELLANEOUS) ×2 IMPLANT
SPONGE GAUZE 2X2 STER 10/PKG (GAUZE/BANDAGES/DRESSINGS) ×1
STAPLER 45 DA VINCI SURE FORM (STAPLE)
STAPLER 45 SUREFORM DVNC (STAPLE) IMPLANT
STAPLER 60 DA VINCI SURE FORM (STAPLE)
STAPLER 60 SUREFORM DVNC (STAPLE) IMPLANT
STAPLER CANNULA SEAL DVNC XI (STAPLE) ×1 IMPLANT
STAPLER CANNULA SEAL XI (STAPLE) ×2
STAPLER CVD CUT GN 40 RELOAD (ENDOMECHANICALS) ×2 IMPLANT
STAPLER CVD CUT GRN 40 RELOAD (ENDOMECHANICALS) IMPLANT
STAPLER ECHELON POWER CIR 29 (STAPLE) IMPLANT
STAPLER ECHELON POWER CIR 31 (STAPLE) ×1 IMPLANT
STAPLER RELOAD 3.5X45 BLU DVNC (STAPLE)
STAPLER RELOAD 3.5X45 BLUE (STAPLE)
STAPLER RELOAD 3.5X60 BLU DVNC (STAPLE)
STAPLER RELOAD 3.5X60 BLUE (STAPLE)
STAPLER RELOAD 4.3X45 GREEN (STAPLE)
STAPLER RELOAD 4.3X45 GRN DVNC (STAPLE)
STAPLER RELOAD 4.3X60 GREEN (STAPLE) ×2
STAPLER RELOAD 4.3X60 GRN DVNC (STAPLE) ×1
STOPCOCK 4 WAY LG BORE MALE ST (IV SETS) ×4 IMPLANT
SURGILUBE 2OZ TUBE FLIPTOP (MISCELLANEOUS) IMPLANT
SUT MNCRL AB 4-0 PS2 18 (SUTURE) ×3 IMPLANT
SUT PDS AB 1 CT1 27 (SUTURE) ×4 IMPLANT
SUT PROLENE 0 CT 2 (SUTURE) IMPLANT
SUT PROLENE 2 0 KS (SUTURE) IMPLANT
SUT PROLENE 2 0 SH DA (SUTURE) ×1 IMPLANT
SUT SILK 2 0 (SUTURE)
SUT SILK 2 0 SH CR/8 (SUTURE) IMPLANT
SUT SILK 2-0 18XBRD TIE 12 (SUTURE) IMPLANT
SUT SILK 3 0 (SUTURE)
SUT SILK 3 0 SH CR/8 (SUTURE) ×2 IMPLANT
SUT SILK 3-0 18XBRD TIE 12 (SUTURE) IMPLANT
SUT V-LOC BARB 180 2/0GR6 GS22 (SUTURE)
SUT VIC AB 3-0 SH 18 (SUTURE) IMPLANT
SUT VIC AB 3-0 SH 27 (SUTURE)
SUT VIC AB 3-0 SH 27XBRD (SUTURE) IMPLANT
SUT VICRYL 0 UR6 27IN ABS (SUTURE) ×2 IMPLANT
SUTURE V-LC BRB 180 2/0GR6GS22 (SUTURE) IMPLANT
SYR 10ML ECCENTRIC (SYRINGE) ×2 IMPLANT
SYS LAPSCP GELPORT 120MM (MISCELLANEOUS)
SYS WOUND ALEXIS 18CM MED (MISCELLANEOUS) ×2
SYSTEM LAPSCP GELPORT 120MM (MISCELLANEOUS) IMPLANT
SYSTEM WOUND ALEXIS 18CM MED (MISCELLANEOUS) ×1 IMPLANT
TOWEL OR NON WOVEN STRL DISP B (DISPOSABLE) ×2 IMPLANT
TRAY FOLEY MTR SLVR 16FR STAT (SET/KITS/TRAYS/PACK) ×2 IMPLANT
TROCAR ADV FIXATION 5X100MM (TROCAR) ×2 IMPLANT
TUBING CONNECTING 10 (TUBING) ×4 IMPLANT
TUBING INSUFFLATION 10FT LAP (TUBING) ×2 IMPLANT

## 2021-07-07 NOTE — Interval H&P Note (Signed)
History and Physical Interval Note:  07/07/2021 12:12 PM  Emily Park  has presented today for surgery, with the diagnosis of rectal prolapse.  The various methods of treatment have been discussed with the patient and family. After consideration of risks, benefits and other options for treatment, the patient has consented to  Procedure(s): XI ROBOT ASSISTED RECTOPEXY (N/A) XI ROBOTIC ASSISTED LOWER ANTERIOR RESECTION (N/A) as a surgical intervention.  The patient's history has been reviewed, patient examined, no change in status, stable for surgery.  I have reviewed the patient's chart and labs.  Questions were answered to the patient's satisfaction.    I have re-reviewed the the patient's records, history, medications, and allergies.  I have re-examined the patient.  I again discussed intraoperative plans and goals of post-operative recovery.  The patient agrees to proceed.  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   Anxiety 02/24/2021   Heart murmur 02/24/2021   Thyroid nodule 10/26/2020   AKI (acute kidney injury) ([place])    Hypotension    Hypovolemic shock ([place]) 08/29/2020   Rectal prolapse 04/05/2020   Migraine 02/27/2019   Mild episode of recurrent major depressive disorder ([place]) 10/25/2018   Abnormal thyroid stimulating hormone (TSH) level 10/24/2018   Syncope and collapse 10/10/2018   Lung nodules 11/14/2015   Paroxysmal SVT (supraventricular  tachycardia) ([place]) 11/13/2015   Attention deficit hyperactivity disorder (ADHD) 11/13/2015    Past Medical History:  Diagnosis Date   Abnormal TSH    ADD (attention deficit disorder)    Anemia    Anxiety    Arrhythmia    Decreased libido    Depression    Heart murmur    Hyperlipidemia    Hypokalemia    Migraine    Nephrolithiasis    Pap smear abnormality of cervix/human papillomavirus (HPV) positive    Sleep difficulties    SVT (supraventricular tachycardia) ([place])     Past Surgical History:  Procedure Laterality Date   ABLATION     Cardiac ablation, endometrial ablation   BREAST LUMPECTOMY     right breast   BREAST LUMPECTOMY Right    CARDIOVERSION     COLONOSCOPY WITH PROPOFOL N/A 05/16/2021   Procedure: COLONOSCOPY WITH PROPOFOL;  Surgeon: Wilford Corner, MD;  Location: [facility];  Service: Endoscopy;  Laterality: N/A;   COLPOSCOPY     w/ cervical biopsy   DILITATION & CURRETTAGE/HYSTROSCOPY WITH NOVASURE ABLATION N/A 09/11/2014   Procedure: DILATATION & CURETTAGE/HYSTEROSCOPY WITH NOVASURE ABLATION;  Surgeon: Lovenia Kim, MD;  Location: [facility];  Service: Gynecology;  Laterality: N/A;   GIVENS CAPSULE STUDY N/A 05/16/2021   Procedure: GIVENS CAPSULE STUDY;  Surgeon: Wilford Corner, MD;  Location: [facility];  Service: Endoscopy;  Laterality: N/A;    Social History   Socioeconomic History   Marital status: Legally Separated    Spouse name: Not on file   Number of children: 2   Years of education: college   Highest education level: Not on file  Occupational History  Occupation: Therapist, 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   Right-handed.   60 ounces of tea and soda per day.   Social Determinants of Radio broadcast assistant Strain: Not on file  Food Insecurity: Not on file  Transportation Needs: Not on file  Physical Activity: Not on file  Stress: Not on file  Social Connections: Not on file  Intimate Partner Violence: Not on file    Family History  Problem Relation Age of Onset   Hypothyroidism Mother    Depression Mother    Cancer Mother        thyroid   Thyroid disease Mother    Hypertension Father    Heart attack Father        Age 23   AAA (abdominal aortic aneurysm) Father    Hypothyroidism Sister    Hyperthyroidism Sister    Diabetes Maternal Grandfather    Breast cancer Paternal Grandmother     Medications Prior to Admission  Medication Sig Dispense Refill Last Dose   acetaminophen (TYLENOL) 500 MG tablet Take 1,000 mg by mouth every 6 (six) hours as needed for fever (pain).   07/01/2021   amphetamine-dextroamphetamine (ADDERALL) 30 MG tablet Take 0.5 tablets by mouth every morning.   week ago   Ascorbic Acid (VITAMIN C PO) Take 1 tablet by mouth every morning.   07/04/2021 at am   CALCIUM PO Take 1 tablet by mouth every morning.   07/04/2021 at am   Cholecalciferol (VITAMIN D3 PO) Take 1 tablet by mouth every morning.   07/04/2021 at am   clonazePAM (KLONOPIN) 1 MG tablet Take 1 mg by mouth at bedtime.   07/03/2021 at pm   doxepin (SINEQUAN) 75 MG capsule Take 75 mg by mouth at bedtime.   07/03/2021   FLUoxetine (PROZAC) 20 MG capsule Take 60 mg by mouth every morning.   07/04/2021 at am   ibuprofen (ADVIL) 200 MG tablet Take 800 mg by mouth every 6 (six) hours as needed for fever (pain).   week ago   Menaquinone-7 (K2 PO) Take 1 tablet by mouth every morning.   07/04/2021 at am   Multiple Vitamin (MULTIVITAMIN WITH MINERALS) TABS tablet  Take 1 tablet by mouth every morning.   07/04/2021 at am   NIACIN PO Take 1 tablet by mouth every morning.   07/04/2021 at am   pantoprazole (PROTONIX) 40 MG tablet Take 40 mg by mouth 2 (two) times daily.   07/04/2021 at am   potassium chloride SA (KLOR-CON) 20 MEQ tablet Take 2 tablets (40 mEq total) by mouth daily. (Patient taking differently: Take 40 mEq by mouth every morning.) 20 tablet 0 07/04/2021 at am   promethazine (PHENERGAN) 25 MG tablet Take 1 tablet (25 mg total) by mouth every 6 (six) hours as needed for nausea or vomiting. 20 tablet 0 Past Week   sucralfate (CARAFATE)  1 g tablet Take 1 g by mouth 2 (two) times daily.   07/04/2021 at am   topiramate (TOPAMAX) 50 MG tablet Take 150 mg by mouth every morning.   07/04/2021 at am   TURMERIC PO Take 1 capsule by mouth every morning.   07/04/2021 at am   linaclotide (LINZESS) 290 MCG CAPS capsule Take 1 capsule (290 mcg total) by mouth daily before breakfast. (Patient not taking: No sig reported) 30 capsule 1 Not Taking   metoCLOPramide (REGLAN) 5 MG tablet Take 5 mg by mouth 2 (two) times daily as needed for nausea or vomiting. (Patient not taking: Reported on 07/04/2021)   Not Taking   ondansetron (ZOFRAN-ODT) 8 MG disintegrating tablet Take 8 mg by mouth every 8 (eight) hours as needed for nausea or vomiting. (Patient not taking: Reported on 07/04/2021)   Not Taking    Current Facility-Administered Medications  Medication Dose Route Frequency Provider Last Rate Last Admin   acetaminophen (TYLENOL) tablet 1,000 mg  1,000 mg Oral On Call to OR [name], MD       acetaminophen (TYLENOL) tablet 1,000 mg  1,000 mg Oral On Call to OR [name], MD       acetaminophen (TYLENOL) tablet 650 mg  650 mg Oral Q6H PRN Meuth, Brooke A, PA-C       alvimopan (ENTEREG) capsule 12 mg  12 mg Oral On Call to OR [name], MD       alvimopan (ENTEREG) capsule 12 mg  12 mg Oral On Call to OR [name], MD       ascorbic acid (VITAMIN C) tablet 500  mg  500 mg Oral Daily Marylyn Ishihara, Tyrone A, DO   500 mg at 07/06/21 1624   bupivacaine liposome (EXPAREL) 1.3 % injection 266 mg  20 mL Infiltration On Call to OR [name], MD       bupivacaine liposome (EXPAREL) 1.3 % injection 266 mg  20 mL Infiltration Once [name], MD       cefoTEtan (CEFOTAN) 2 g in sodium chloride 0.9 % 100 mL IVPB  2 g Intravenous On Call to OR [name], MD       chlorpheniramine-HYDROcodone (TUSSIONEX) 10-8 MG/5ML suspension 5 mL  5 mL Oral Q12H PRN Marylyn Ishihara, Tyrone A, DO       dextrose 5 % and 0.9 % NaCl with KCl 20 mEq/L infusion   Intravenous Continuous Autumn Messing III, MD   Stopped at 07/07/21 1151   doxepin (SINEQUAN) capsule 75 mg  75 mg Oral QHS Autumn Messing III, MD   75 mg at 07/06/21 2155   enoxaparin (LOVENOX) injection 40 mg  40 mg Subcutaneous On Call to OR [name], MD       feeding supplement (ENSURE PRE-SURGERY) liquid 296 mL  296 mL Oral Once [name], MD       fentaNYL (SUBLIMAZE) injection 12.5-25 mcg  12.5-25 mcg Intravenous Q4H PRN Meuth, Brooke A, PA-C   25 mcg at 07/07/21 1144   FLUoxetine (PROZAC) capsule 60 mg  60 mg Oral q morning Autumn Messing III, MD   60 mg at 07/06/21 0857   gabapentin (NEURONTIN) capsule 300 mg  300 mg Oral On Call to OR [name], MD       guaiFENesin-dextromethorphan (ROBITUSSIN DM) 100-10 MG/5ML syrup 10 mL  10 mL Oral Q4H PRN Marylyn Ishihara, Tyrone A, DO   10 mL at 07/06/21 1431   heparin injection 5,000 Units  5,000 Units Subcutaneous Q8H Marlou Starks,  Sena Hitch, MD   5,000 Units at 07/06/21 2327   Ipratropium-Albuterol (COMBIVENT) respimat 1 puff  1 puff Inhalation TID [name], MD   1 puff at 07/07/21 1148   linaclotide (LINZESS) capsule 290 mcg  290 mcg Oral QAC breakfast Autumn Messing III, MD   290 mcg at 07/06/21 0857   methocarbamol (ROBAXIN) tablet 500 mg  500 mg Oral Q6H PRN Meuth, Brooke A, PA-C       metoCLOPramide (REGLAN) tablet 5 mg  5 mg Oral BID PRN Autumn Messing III, MD       neomycin [facility]) tablet 1,000 mg   1,000 mg Oral 3 times per day [name], MD   1,000 mg at 07/06/21 2325   ondansetron (ZOFRAN) injection 4 mg  4 mg Intravenous Q4H PRN [name], MD   4 mg at 07/06/21 1626   ondansetron (ZOFRAN-ODT) disintegrating tablet 8 mg  8 mg Oral Q8H PRN Autumn Messing III, MD   8 mg at 07/05/21 0905   pantoprazole (PROTONIX) EC tablet 40 mg  40 mg Oral BID Autumn Messing III, MD   40 mg at 07/06/21 2155   polyethylene glycol (MIRALAX / GLYCOLAX) packet 17 g  17 g Oral BID Vladimir Crofts, PA-C   17 g at 07/06/21 0857   potassium chloride SA (KLOR-CON) CR tablet 40 mEq  40 mEq Oral Once Alma Friendly, MD       remdesivir 100 mg in sodium chloride 0.9 % 100 mL IVPB  100 mg Intravenous Daily Marylyn Ishihara, Tyrone A, DO 200 mL/hr at 07/07/21 1150 100 mg at 07/07/21 1150   sucralfate (CARAFATE) tablet 1 g  1 g Oral BID Autumn Messing III, MD   1 g at 07/06/21 2155   traMADol (ULTRAM) tablet 50 mg  50 mg Oral Q6H PRN Meuth, Brooke A, PA-C   50 mg at 07/05/21 2214   zinc sulfate capsule 220 mg  220 mg Oral Daily Kyle, Tyrone A, DO   220 mg at 07/06/21 1431     Allergies  Allergen Reactions   Amoxicillin Rash   Bactrim [Sulfamethoxazole-Trimethoprim] Rash    Rash noted to chest and back   Zithromax [Azithromycin] Rash    BP (!) 96/58 (BP Location: Left Arm)   Pulse 86   Temp 98.2 F (36.8 C) (Oral)   Resp 20   Ht 5\' 4"  (1.626 m)   Wt 59 kg   SpO2 96%   BMI 22.31 kg/m   Labs: Results for orders placed or performed during the hospital encounter of 07/04/21 (from the past 48 hour(s))  Hemoglobin A1c     Status: None   Collection Time: 07/06/21  1:59 PM  Result Value Ref Range   Hgb A1c MFr Bld 4.9 4.8 - 5.6 %    Comment: (NOTE) Pre diabetes:          5.7%-6.4%  Diabetes:              >6.4%  Glycemic control for   <7.0% adults with diabetes    Mean Plasma Glucose 93.93 mg/dL    Comment: Performed at [facility], [place] [address] [place], [place] [address]  C-reactive protein      Status: Abnormal   Collection Time: 07/06/21  1:59 PM  Result Value Ref Range   CRP 2.9 (H) <1.0 mg/dL    Comment: Performed at [place] [address] [place], [place] [address]  D-dimer, quantitative  Status: Abnormal   Collection Time: 07/06/21  1:59 PM  Result Value Ref Range   D-Dimer, Quant 1.43 (H) 0.00 - 0.50 ug/mL-FEU    Comment: (NOTE) At the manufacturer cut-off value of 0.5 g/mL FEU, this assay has a negative predictive value of 95-100%.This assay is intended for use in conjunction with a clinical pretest probability (PTP) assessment model to exclude pulmonary embolism (PE) and deep venous thrombosis (DVT) in outpatients suspected of PE or DVT. Results should be correlated with clinical presentation. Performed at [facility], [place] [address], [place], [place] [address]   Ferritin     Status: None   Collection Time: 07/06/21  1:59 PM  Result Value Ref Range   Ferritin 102 11 - 307 ng/mL    Comment: Performed at [facility], [place] [address], [place], [place] [address]  CBC with Differential/Platelet     Status: Abnormal   Collection Time: 07/07/21  4:52 AM  Result Value Ref Range   WBC 3.9 (L) 4.0 - 10.5 K/uL   RBC 3.37 (L) 3.87 - 5.11 MIL/uL   Hemoglobin 9.4 (L) 12.0 - 15.0 g/dL   HCT 30.5 (L) 36.0 - 46.0 %   MCV 90.5 80.0 - 100.0 fL   MCH 27.9 26.0 - 34.0 pg   MCHC 30.8 30.0 - 36.0 g/dL   RDW 22.2 (H) 11.5 - 15.5 %   Platelets 286 150 - 400 K/uL   nRBC 0.0 0.0 - 0.2 %   Neutrophils Relative % 43 %   Neutro Abs 1.7 1.7 - 7.7 K/uL   Lymphocytes Relative 32 %   Lymphs Abs 1.3 0.7 - 4.0 K/uL   Monocytes Relative 15 %   Monocytes Absolute 0.6 0.1 - 1.0 K/uL   Eosinophils Relative 8 %   Eosinophils Absolute 0.3 0.0 - 0.5 K/uL   Basophils Relative 1 %   Basophils Absolute 0.0 0.0 - 0.1 K/uL   Immature Granulocytes 1 %   Abs Immature Granulocytes 0.04 0.00 - 0.07 K/uL    Comment: Performed at [facility], [place] [address] [place], [place] [address]  Comprehensive metabolic panel     Status: Abnormal   Collection Time: 07/07/21  4:52 AM  Result Value Ref Range   Sodium 140 135 - 145 mmol/L   Potassium 2.9 (L) 3.5 - 5.1 mmol/L   Chloride 111 98 - 111 mmol/L   CO2 23 22 - 32 mmol/L   Glucose, Bld 102 (H) 70 - 99 mg/dL    Comment: Glucose reference range applies only to samples taken after fasting for at least 8 hours.   BUN 6 6 - 20 mg/dL   Creatinine, Ser 0.76 0.44 - 1.00 mg/dL   Calcium 7.9 (L) 8.9 - 10.3 mg/dL   Total Protein 5.4 (L) 6.5 - 8.1 g/dL   Albumin 2.5 (L) 3.5 - 5.0 g/dL   AST 20 15 - 41 U/L   ALT 13 0 - 44 U/L   Alkaline Phosphatase 50 38 - 126 U/L   Total Bilirubin 0.2 (L) 0.3 - 1.2 mg/dL   GFR, Estimated >60 >60 mL/min    Comment: (NOTE) Calculated using the CKD-EPI Creatinine Equation (2021)    Anion gap 6 5 - 15    Comment: Performed at [facility], [place] [address] [place], [place] [address]  C-reactive protein     Status: Abnormal   Collection Time: 07/07/21  4:52 AM  Result Value Ref Range   CRP  1.8 (H) <1.0 mg/dL    Comment: Performed at [facility], [place] [address] [place], [place] [address]  D-dimer, quantitative     Status: Abnormal   Collection Time: 07/07/21  4:52 AM  Result Value Ref Range   D-Dimer, Quant 1.20 (H) 0.00 - 0.50 ug/mL-FEU    Comment: (NOTE) At the manufacturer cut-off value of 0.5 g/mL FEU, this assay has a negative predictive value of 95-100%.This assay is intended for use in conjunction with a clinical pretest probability (PTP) assessment model to exclude pulmonary embolism (PE) and deep venous thrombosis (DVT) in outpatients suspected of PE or DVT. Results should be correlated with clinical presentation. Performed at [facility], [place] [address] [place], [place] [address]   Ferritin     Status: None   Collection Time: 07/07/21  4:52 AM  Result Value Ref Range    Ferritin 119 11 - 307 ng/mL    Comment: Performed at [facility], [place] [address] [place], [place] [address]  Magnesium     Status: None   Collection Time: 07/07/21  4:52 AM  Result Value Ref Range   Magnesium 1.7 1.7 - 2.4 mg/dL    Comment: Performed at [facility], [place] [address], [place], [place] [address]  Phosphorus     Status: Abnormal   Collection Time: 07/07/21  4:52 AM  Result Value Ref Range   Phosphorus 2.4 (L) 2.5 - 4.6 mg/dL    Comment: Performed at [facility], [place] [address], [place], [place] [address]  Type and screen [place]     Status: None   Collection Time: 07/07/21  4:52 AM  Result Value Ref Range   ABO/RH(D) O POS    Antibody Screen NEG    Sample Expiration      07/10/2021,2359 Performed at [facility], [place] [address] [place], [place] [address]   Procalcitonin - Baseline     Status: None   Collection Time: 07/07/21  4:52 AM  Result Value Ref Range   Procalcitonin <0.10 ng/mL    Comment:        Interpretation: PCT (Procalcitonin) <= 0.5 ng/mL: Systemic infection (sepsis) is not likely. Local bacterial infection is possible. (NOTE)       Sepsis PCT Algorithm           Lower Respiratory Tract                                      Infection PCT Algorithm    ----------------------------     ----------------------------         PCT < 0.25 ng/mL                PCT < 0.10 ng/mL          Strongly encourage             Strongly discourage   discontinuation of antibiotics    initiation of antibiotics    ----------------------------     -----------------------------       PCT 0.25 - 0.50 ng/mL            PCT 0.10 - 0.25 ng/mL               OR       >80% decrease in PCT            Discourage  initiation of                                            antibiotics      Encourage discontinuation           of antibiotics    ----------------------------      -----------------------------         PCT >= 0.50 ng/mL              PCT 0.26 - 0.50 ng/mL               AND        <80% decrease in PCT             Encourage initiation of                                             antibiotics       Encourage continuation           of antibiotics    ----------------------------     -----------------------------        PCT >= 0.50 ng/mL                  PCT > 0.50 ng/mL               AND         increase in PCT                  Strongly encourage                                      initiation of antibiotics    Strongly encourage escalation           of antibiotics                                     -----------------------------                                           PCT <= 0.25 ng/mL                                                 OR                                        > 80% decrease in PCT                                      Discontinue / Do not initiate  antibiotics  Performed at [facility], [place] [address] [place], [place] [address]     Imaging / Studies: DG Chest Port 1 View  Result Date: 07/06/2021 CLINICAL DATA:  Cough, COVID positive EXAM: PORTABLE CHEST 1 VIEW COMPARISON:  08/29/2020 FINDINGS: Mild right lower lung opacity. Left lung is clear. No pleural effusion or pneumothorax. The heart is normal in size IMPRESSION: Mild right lower lobe opacity, compatible with pneumonia in this patient with known COVID. Electronically Signed   By: Julian Hy M.D.   On: 07/06/2021 19:26     .Adin Hector, M.D., F.A.C.S. Gastrointestinal and Minimally Invasive Surgery [facility] [address], [place],  [address] [phone] Main / Paging  07/07/2021 12:12 PM    Adin Hector

## 2021-07-07 NOTE — Plan of Care (Signed)

## 2021-07-07 NOTE — Anesthesia Preprocedure Evaluation (Addendum)
Anesthesia Evaluation  Patient identified by MRN, date of birth, ID band Patient awake    Reviewed: Allergy & Precautions, H&P , NPO status , Patient's Chart, lab work & pertinent test results  Airway Mallampati: II  TM Distance: >3 FB Neck ROM: Full    Dental no notable dental hx. (+) Teeth Intact, Dental Advisory Given   Pulmonary neg pulmonary ROS,    Pulmonary exam normal breath sounds clear to auscultation       Cardiovascular negative cardio ROS   Rhythm:Regular Rate:Normal     Neuro/Psych  Headaches, Anxiety Depression    GI/Hepatic negative GI ROS, Neg liver ROS,   Endo/Other  negative endocrine ROS  Renal/GU negative Renal ROS  negative genitourinary   Musculoskeletal   Abdominal   Peds  (+) ATTENTION DEFICIT DISORDER WITHOUT HYPERACTIVITY Hematology  (+) Blood dyscrasia, anemia ,   Anesthesia Other Findings   Reproductive/Obstetrics negative OB ROS                            Anesthesia Physical Anesthesia Plan  ASA: 2  Anesthesia Plan: General   Post-op Pain Management:    Induction: Intravenous  PONV Risk Score and Plan: 4 or greater and Ondansetron, Dexamethasone and Midazolam  Airway Management Planned: Oral ETT  Additional Equipment:   Intra-op Plan:   Post-operative Plan: Extubation in OR  Informed Consent: I have reviewed the patients History and Physical, chart, labs and discussed the procedure including the risks, benefits and alternatives for the proposed anesthesia with the patient or authorized representative who has indicated his/her understanding and acceptance.     Dental advisory given  Plan Discussed with: CRNA  Anesthesia Plan Comments:         Anesthesia Quick Evaluation

## 2021-07-07 NOTE — Progress Notes (Signed)
The patient is injury-free, afebrile, alert, and oriented X 3. Vital signs were within the baseline during this shift. Pt is NPO since midnight for morning procedure. Pt denies chest pain, SOB, nausea, vomiting, dizziness, signs or symptoms of bleeding or infection or acute changes during this shift. We will continue to monitor and work toward achieving the care plan goals

## 2021-07-07 NOTE — Op Note (Signed)
07/07/2021  5:39 PM  PATIENT:  Emily Park  50 y.o. female  Patient Care Team: [name], MD as PCP - General (Internal Medicine) Wilford Corner, MD as Consulting Physician (Gastroenterology)  PRE-OPERATIVE DIAGNOSIS:  Recurrent rectal prolapse  POST-OPERATIVE DIAGNOSIS:  RECURRENT RECTAL PROLAPSE  PROCEDURE:   ROBOTIC LOW ANTERIOR RECTOSIGMOID RESECTION ROBOTIC RECTOPEXY  RIGID PROCTOSCOPY TRANSVERSUS ABDOMINIS PLANE (TAP) BLOCK - BILATERAL INTRAOPERATIVE ASSESSMENT OF TISSUE VASCULAR PERFUSION USING ICG (indocyanine green) IMMUNOFLUORESCENCE  SURGEON:  Adin Hector, MD  ASSISTANT: OR Staff   ANESTHESIA:     local and general  TRANSVERSUS ABDOMINIS PLANE (TAP) BLOCK - BILATERAL Regional TRANSVERSUS ABDOMINIS PLANE (TAP) nerve block for perioperative & postoperative pain control provided with liposomal bupivacaine (Experel) mixed with 0.25% bupivacaine as a Bilateral TAP block x 52mL each side at the level of the transverse abdominis & preperitoneal spaces along the flank at the anterior axillary line, from subcostal ridge to iliac crest under laparoscopic guidance    EBL:  Total I/O In: 1784.8 [I.V.:1384.8; IV Piggyback:400] Out: 325 [Urine:225; Blood:100]  Delay start of Pharmacological VTE agent (>24hrs) due to surgical blood loss or risk of bleeding:  no  DRAINS: 19 Fr Blake drain goes down to posterior pelvis  SPECIMEN:  RECTOSIGMOID COLON WITH DISTAL ANASTOMOTIC RING  DISPOSITION OF SPECIMEN:  PATHOLOGY  COUNTS:  YES  PLAN OF CARE: Admit to inpatient   PATIENT DISPOSITION:  PACU - hemodynamically stable.  INDICATION:    Pleasant woman with IBS type symptoms constipation predominant with occasional overflow diarrhea.  Developing recurrent episodes of rectal prolapse.  Challenged to get affordable bowel regimen with Linzess.  Admitted with complete rectal prolapse incontinence despite being reduced.  Recurrent prolapsing every day.  Followed by  [facility] gastroenterology.  Had colonoscopy a couple months ago that showed no cancer or other lead point.  No active colitis or proctitis.  She is COVID-positive but minimal symptoms.  Because of her accelerating worsening symptoms with incontinence, we did not feel was safe to discharge her.  Medicine evaluated and felt she was relatively stable for surgery.  Patient wished to be aggressive and proceed with resection and rectopexy.  The anatomy & physiology of the digestive tract was discussed.  The pathophysiology of rectal prolapse was discussed.  Natural history risks without surgery was discussed.   I feel the risks of no intervention will lead to serious problems that outweigh the operative risks; therefore, I recommended surgery to treat the pathology.  Possible need for sigmoid colectomy to remove redundant colon was discussed.  Pexy by suture and probable mesh reinforcement was discussed as well.  Laparoscopic & open techniques were discussed.   Risks such as bleeding, infection, abscess, leak, reoperation, possible ostomy, hernia, stroke, heart attack, death, and other risks were discussed.   I noted a good likelihood this will help address the problem.  Goals of post-operative recovery were discussed as well.  We will work to minimize complications.  An educational handout on the technique was given as well.  Questions were answered.  The patient expresses understanding & wishes to proceed with surgery.   OR FINDINGS:  Redundant thickened rectosigmoid colon and sigmoid colon.  Anterior and posterior dissection with resection of almost 2 feet of redundant colon.  0 Prolene suture rectopexy to proximal rectum just distal anastomosis to the sacral promontory.  2-0 V-lock closure on peritoneal reflections bilaterally with some pexing of the anastomosis.  No obvious metastatic disease on visceral parietal peritoneum or liver.  The anastomosis rests 11 cm from the anal verge by rigid  proctoscopy.  CASE DATA:  Type of patient?: LDOW CASE (Surgical Hospitalist [facility] Inpatient)  Status of Case? URGENT Add On  Infection Present At Time Of Surgery (PATOS)?  NO   PROCEDURE:  Informed consent was confirmed.  COVID precautions done.  Took some extra time to get some jewelry rings off safely and had the patient properly positioned.  No pulmonary decompensation.  Felt agreeable to proceeding with surgery according anesthesia.  Patient underwent general anesthesia without any difficulty & was positioned in low lithotomy with arms tucked.  The patient had a Foley catheter sterilely placed.  The abdomen and perineum were prepped and draped in sterile fashion.  Surgical time-out confirmed our plan.   I placed a 8-mm robotic port in the right upper quadrant using steep reverse Trendelenburg and left sideup.  The patient had orogastric tube for decompression.  Entry was clean.  We induced carbon dioxide insufflation.  Port placed.  Extra ports placed carefully under direct visualization. .  We positioned the patient head down and evacuated the greater omentum and small intestine into the upper/mid abdomen.   Went ahead and docked the [employer] carefully.  Patient had a very redundant left colon somewhat torturous in the sigmoid colon and some flopped out rectosigmoid colon down to the pelvis with thickening.  Somewhat low peritoneal reflection.  I turned attention to the pelvis.  I elevated the redundant rectosigmoid colon.  I scored the peritoneum at the rectosigmoid region at the level of the sacral promontory and continued scoring of peritoneum down to the right peritoneal reflection.  I left the rectosigmoid mesentery anteriorly and got into the presacral plane.  I followed blunt dissection down the sacrum and down towards the levators for good posterior rectal mobilzation.    I dissected more proximally.  I elevated the sigmoid mesentery to identify the retroperitoneum and in the presacral  plane.  I identified the left ureter, kept that posteriorly in the retroperitioneum.  I identified the left gonadals and kept those posterior as well.  Patient had somewhat thickened very stretched out mesentery which correlates with classic prolapse.  Worked mediolateral to the descending colon.  I tried to avoid any more dissection more proximally to avoid disturbing any further tensions of the left colon to the left pericolic gutter.   I then took some of the peritoneal reflection on the left rectosigmoid side going up the pelvic brim to the low anterior reflection. Freed the visceral peritoneum off the right rectum as well to connect with the peritoneal reflection.  I then came around anteriorly to help mobilize the anterior rectum off its anterior attachments to the rectovaginal septum all the way down to the pelvic floor for good anterior rectal mobilization.  I went back to mobilizing the posterior mesorectum off the presacral space. Freed off the posterior midline anococcygeal ligament gradually elevated and was able to reduce the chronic rectal prolapse back in to the pelvis.  Terminated between anterior and posterior dissection, sparing the lateral pedicles.   With that, I could get the mid rectum to stretch up to the sacral promontory to help straighten out the stretched out rectum. I did digital rectal exam to confirm that I had good dissection anteriorly and posteriorly down to the sphincter complex with good mobilization. We had worked to spare lateral mid and distal mesorectal blood supply.  I made an effort to keep the lateral pedicles intact on both sides  at mid and distal rectum.  I created a window between the proximal and mid rectal regions where it reached to the sacral promontory under some mild tension.  I transected the mesorectum at the proximal/mid rectal junction and freed the mesorectum down to the mid rectum to ensure good blood supply. Then brought down to the left colon and chose a  region that could reach down to the sacral promontory well. I mobilized the colon a little bit laterally since it was somewhat twisted and corkscrewed. That help straighten out such that the mid-descending colon could reach down well.  I transected the left colon mesentery in a radial fashion to the premarked area of the mid descending colon I could reach down to the sacral promontory.  To access vascular perfusion of tissues, we asked anesthesia use intravenous  indocyanine green (ICG) with IV flush.  I switched to the NIR fluorescence (Firefly mode) imaging window on the [employer] robot platform.  We were able to see good light green visualization of blood vessels with good vascular perfusion of tissues, confirming good tissue perfusion of tissues (descending colon and mid rectum) planned for anastomosis.    Try to use a robotic green stapler but it was not functioning well.  I created an extraction incision through a small Pfannenstiel incision in the suprapubic region where the stapler port had been placed.  Placed a wound protector.  I was able to eviscerate the rectosigmoid colon out the wound.   I transected at the mid rectum using a contour stapler with 1 firing to good result.  Then eviscerated the redundant rectosigmoid and distal descending colon out the wound located the predetermined region that had been skeletonized at the mesentery and the mid/distal descending junction.  I clamped the colon proximal to this area using a reusable pursestringer device.  Passed a 2-0 Keith needle. I transected at the descending/sigmoid junction with a scalpel. I got healthy bleeding mucosa.  We sent the rectosigmoid colon specimen off to go to pathology.  We sized the colon orifice.  I chose a 31 EEA anvil stapler system.  Reinforced the prolene pursestring it with interrupted silk suture like a belt loops around the pursestring.  Hemostasis was good. We returned to the colon with the anvil back into the abdomen.   Place: Small bowel away to expose the sacral promontory.   I then used 0 Prolene suture through the left lateral sacral promontory and the left posterior rectal stump. Left untied & clipped that. Did a similar mirror-image stitch on the right side of the sacral promontory to the right posterior rectal stump. Clipped that to the side as well. Hemostasis was good in the abdomen and pelvis. The distal end of the remaining colon reached down to the rectal stump.       I scrubbed down with my right upper extremity and did gentle anal dilation and advanced the EEA stapler up the rectal stump. The spike was brought out at the provimal end of the rectal stump under direct visualization.  We attached the anvil of the proximal colon the spike of the stapler.  Anvil was tightened down and held clamped for 60 seconds. The EEA stapler was fired and held clamped for 30 seconds. The stapler was released & removed. We noted 2 excellent anastomotic rings. Blue stitch is in the proximal ring.  I  did rigid proctoscopy noted the anastomosis was at 11 cm from the anal verge consistent with the proximal rectum.  We irrigation  of isotonic solution & held that for the pelvic air leak test .  The rectum was insufflated the rectum while clamping the colon proximal to that anastomosis.  There was a negative air leak test. There was no tension of mesentery or bowel at the anastomosis.   Tissues looked viable.  Ureters & bowel uninjured.  The anastomosis looked healthy.  I tied down the pair of 0 Prolene stitches to help do the posterolateral rectopexy in the mid rectum to the sacral promontory.   The robot was read docked.  I then used 2-0 V lock and ran on the distal lateral pelvis peritoneal reflection and ran proximally up the left paracolic gutter. Did another stitch on the right side as well.  Thus anterolateral colorectopexy right and left.  Placed a drain through a side-port into the right superior presacral space down to the  pelvis and wrapped around the anterior pelvis. Secured with 2-0 Prolene suture.  We inspected the abdomen and pelvis cavity..  Hemostasis was good.   Endoluminal gas was evacuated.  Ports & wound protector removed.  We changed gloves & redraped the patient per colon SSI prevention protocol.  We aspirated the antibiotic irrigation.  Hemostasis was good.  Sterile unused instruments were used from this point.  I closed the skin at the port sites using Monocryl stitch and sterile dressing.  I closed the extraction wound using a 0 Vicryl vertical peritoneal closure and a #1 PDS transverse anterior rectal fascial closure like a small Pfannenstiel closure. I closed the skin with some interrupted Monocryl stitches.  I placed sterile dressings.     Patient is being extubated go to recovery room. I made an attempt to locate & reach the desired patient contact, patient's husband Elta Gu[place] hormone on his cell phone, to discuss the patient's overall status and my recommendations.  No one is available at this time.  My plans & instructions have been written in the chart.    Adin Hector, M.D., F.A.C.S. Gastrointestinal and Minimally Invasive Surgery [facility] [address], [place], [place] [address] [phone] Main / Paging

## 2021-07-07 NOTE — Anesthesia Postprocedure Evaluation (Signed)
Anesthesia Post Note  Patient: Emily Park  Procedure(s) Performed: XI ROBOT ASSISTED RECTOPEXY XI ROBOTIC ASSISTED LOW ANTERIOR RECTOSIGMOID RESECTION WITH RIGID PROCTOSCOPY, TAP BLOCK     Patient location during evaluation: PACU Anesthesia Type: General Level of consciousness: awake and alert Pain management: pain level controlled Vital Signs Assessment: post-procedure vital signs reviewed and stable Respiratory status: spontaneous breathing, nonlabored ventilation, respiratory function stable and patient connected to nasal cannula oxygen Cardiovascular status: blood pressure returned to baseline and stable Postop Assessment: no apparent nausea or vomiting Anesthetic complications: no   No notable events documented.  Last Vitals:  Vitals:   07/07/21 1830 07/07/21 1845  BP: 122/83 109/81  Pulse: 82 84  Resp: 15 12  Temp:  36.4 C  SpO2: 95% 95%    Last Pain:  Vitals:   07/07/21 1845  TempSrc:   PainSc: Asleep                 Jissell Trafton,W. EDMOND

## 2021-07-07 NOTE — Progress Notes (Signed)
PROGRESS NOTE  Emily Park EZM:[id number] DOB: Mar 12, 1971 DOA: 07/04/2021 PCP: [name], MD  HPI/Recap of past 24 hours: Emily Park is a 50 y.o. female with medical history significant of IDA, chronic constipation, GERD, rectal prolapse. Presenting w/ rectal prolapse. Admitted to surgical service for surgery. During w/u it was noted that she was positive for COVID. Patient reports that she has had cough and achiness for the last week or so. She tried robitussin at home and it didn't help. COVID test X 2 negative at outside facility. She presented to the surgery service 2 days ago for her prolapse. It was noted that she was COVID positive at the time. She continued to have cough and episodes of hypoxia. [facility] was consulted for COVID recs   Today, pt denies any worsening cough, SOB, chest pain, fever/chills, abdominal pain. Pt reports feeling tired this am, as pt was up all night due to bowel prep.  Assessment/Plan: Principal Problem:   Rectal prolapse Active Problems:   Attention deficit hyperactivity disorder (ADHD)   Anxiety   Covid- 19 pneumonia Currently saturating well on RA Afebrile with leukopenia Trend inflammatory markers Procalcitonin negative, continue trend CXR with mild RLL opacity Continue remdesivir, may need a total of 3 days if continues to improve IS, cough suppressant  Supplemental O2 prn Monitor closely  Hypokalemia Replace prn    Remainder per primary team Rectal prolapse, for surgical repair    Estimated body mass index is 22.31 kg/m as calculated from the following:   Height as of this encounter: 5\' 4"  (1.626 m).   Weight as of this encounter: 59 kg.     Code Status: Full  Family Communication: Discussed with husband at bedside  Disposition Plan: Status is: Inpatient  Remains inpatient appropriate because: Surgical need     Consultants: [facility]  Procedures: Rectal prolapse repair  Antimicrobials: None  DVT  prophylaxis: As per surgery   Objective: Vitals:   07/05/21 2121 07/05/21 2258 07/06/21 2300 07/07/21 0554  BP: 110/73 103/69 100/64 (!) 96/58  Pulse: 98 99 91 86  Resp: 20 20 20 20   Temp: 98.4 F (36.9 C) 98.1 F (36.7 C) 99 F (37.2 C) 98.2 F (36.8 C)  TempSrc: Axillary Axillary Oral Oral  SpO2: 98% 97% 96% 96%  Weight:      Height:        Intake/Output Summary (Last 24 hours) at 07/07/2021 1320 Last data filed at 07/07/2021 5035 Gross per 24 hour  Intake 2756.89 ml  Output --  Net 2756.89 ml   Filed Weights   07/04/21 1631  Weight: 59 kg    Exam: General: NAD  Cardiovascular: S1, S2 present Respiratory: CTAB Abdomen: Soft, nontender, nondistended, bowel sounds present Musculoskeletal: No bilateral pedal edema noted Skin: Normal Psychiatry: Normal mood     Data Reviewed: CBC: Recent Labs  Lab 07/04/21 1718 07/07/21 0452  WBC 11.3* 3.9*  NEUTROABS 7.8* 1.7  HGB 10.9* 9.4*  HCT 33.5* 30.5*  MCV 88.4 90.5  PLT 321 465   Basic Metabolic Panel: Recent Labs  Lab 07/04/21 1718 07/07/21 0452  NA 138 140  K 3.1* 2.9*  CL 107 111  CO2 24 23  GLUCOSE 95 102*  BUN 7 6  CREATININE 0.82 0.76  CALCIUM 8.5* 7.9*  MG  --  1.7  PHOS  --  2.4*   GFR: Estimated Creatinine Clearance: 71.8 mL/min (by C-G formula based on SCr of 0.76 mg/dL). Liver Function Tests: Recent Labs  Lab  07/04/21 1718 07/07/21 0452  AST 23 20  ALT 16 13  ALKPHOS 61 50  BILITOT 0.5 0.2*  PROT 6.9 5.4*  ALBUMIN 3.5 2.5*   No results for input(s): LIPASE, AMYLASE in the last 168 hours. No results for input(s): AMMONIA in the last 168 hours. Coagulation Profile: No results for input(s): INR, PROTIME in the last 168 hours. Cardiac Enzymes: No results for input(s): CKTOTAL, CKMB, CKMBINDEX, TROPONINI in the last 168 hours. BNP (last 3 results) No results for input(s): PROBNP in the last 8760 hours. HbA1C: Recent Labs    07/06/21 1359  HGBA1C 4.9   CBG: No results  for input(s): GLUCAP in the last 168 hours. Lipid Profile: No results for input(s): CHOL, HDL, LDLCALC, TRIG, CHOLHDL, LDLDIRECT in the last 72 hours. Thyroid Function Tests: No results for input(s): TSH, T4TOTAL, FREET4, T3FREE, THYROIDAB in the last 72 hours. Anemia Panel: Recent Labs    07/06/21 1359 07/07/21 0452  FERRITIN 102 119   Urine analysis:    Component Value Date/Time   COLORURINE STRAW (A) 04/13/2021 1413   APPEARANCEUR CLOUDY (A) 04/13/2021 1413   LABSPEC 1.003 (L) 04/13/2021 1413   PHURINE 6.0 04/13/2021 1413   GLUCOSEU NEGATIVE 04/13/2021 1413   HGBUR SMALL (A) 04/13/2021 1413   BILIRUBINUR NEGATIVE 04/13/2021 1413   BILIRUBINUR Neg 02/25/2014 0858   KETONESUR NEGATIVE 04/13/2021 1413   PROTEINUR NEGATIVE 04/13/2021 1413   UROBILINOGEN 0.2 02/25/2014 0858   UROBILINOGEN 0.2 05/20/2009 0310   NITRITE POSITIVE (A) 04/13/2021 1413   LEUKOCYTESUR LARGE (A) 04/13/2021 1413   Sepsis Labs: @LABRCNTIP (procalcitonin:4,lacticidven:4)  ) Recent Results (from the past 240 hour(s))  COVID-19, Flu A+B and RSV ([facility])     Status: None   Collection Time: 07/01/21  4:30 PM  Result Value Ref Range Status   SARS-CoV-2, NAA Not Detected Not Detected Final   Influenza A, NAA Not Detected Not Detected Final   Influenza B, NAA Not Detected Not Detected Final   RSV, NAA Not Detected Not Detected Final   Test Information: Comment  Final    Comment: This nucleic acid amplification test was developed and its performance characteristics determined by [employer]. Nucleic acid amplification tests include RT-PCR and TMA. This test has not been FDA cleared or approved. This test has been authorized by FDA under an Emergency Use Authorization (EUA). This test is only authorized for the duration of time the declaration that circumstances exist justifying the authorization of the emergency use of in vitro diagnostic tests for detection of SARS-CoV-2 virus and/or  diagnosis of COVID-19 infection under section 564(b)(1) of the Act, 21 U.S.C. 001VCB-4(W) (1), unless the authorization is terminated or revoked sooner. When diagnostic testing is negative, the possibility of a false negative result should be considered in the context of a patient's recent exposures and the presence of clinical signs and symptoms consistent with COVID-19. An individual without symptoms of COVID-19 and who is not shedding SARS-CoV-2 virus wo uld expect to have a negative (not detected) result in this assay.   Resp Panel by RT-PCR (Flu A&B, Covid) Nasopharyngeal Swab     Status: Abnormal   Collection Time: 07/04/21  7:30 PM   Specimen: Nasopharyngeal Swab; Nasopharyngeal(NP) swabs in vial transport medium  Result Value Ref Range Status   SARS Coronavirus 2 by RT PCR POSITIVE (A) NEGATIVE Final    Comment: CRITICAL RESULT CALLED TO, READ BACK BY AND VERIFIED WITH:  Clarene Reamer RN 07/04/21 @ 2240 VS (NOTE) SARS-CoV-2 target nucleic acids are  DETECTED.  The SARS-CoV-2 RNA is generally detectable in upper respiratory specimens during the acute phase of infection. Positive results are indicative of the presence of the identified virus, but do not rule out bacterial infection or co-infection with other pathogens not detected by the test. Clinical correlation with patient history and other diagnostic information is necessary to determine patient infection status. The expected result is Negative.  Fact Sheet for Patients: EntrepreneurPulse.com.au  Fact Sheet for Healthcare Providers: IncredibleEmployment.be  This test is not yet approved or cleared by the [place] FDA and  has been authorized for detection and/or diagnosis of SARS-CoV-2 by FDA under an Emergency Use Authorization (EUA).  This EUA will remain in effect (meaning th is test can be used) for the duration of  the COVID-19 declaration under Section 564(b)(1) of the  Act, 21 U.S.C. section 360bbb-3(b)(1), unless the authorization is terminated or revoked sooner.     Influenza A by PCR NEGATIVE NEGATIVE Final   Influenza B by PCR NEGATIVE NEGATIVE Final    Comment: (NOTE) The Xpert Xpress SARS-CoV-2/FLU/RSV plus assay is intended as an aid in the diagnosis of influenza from Nasopharyngeal swab specimens and should not be used as a sole basis for treatment. Nasal washings and aspirates are unacceptable for Xpert Xpress SARS-CoV-2/FLU/RSV testing.  Fact Sheet for Patients: EntrepreneurPulse.com.au  Fact Sheet for Healthcare Providers: IncredibleEmployment.be  This test is not yet approved or cleared by the [place] FDA and has been authorized for detection and/or diagnosis of SARS-CoV-2 by FDA under an Emergency Use Authorization (EUA). This EUA will remain in effect (meaning this test can be used) for the duration of the COVID-19 declaration under Section 564(b)(1) of the Act, 21 U.S.C. section 360bbb-3(b)(1), unless the authorization is terminated or revoked.  Performed at [facility], [place] [address], [place], [place] [address]       Studies: DG Chest Port 1 View  Result Date: 07/06/2021 CLINICAL DATA:  Cough, COVID positive EXAM: PORTABLE CHEST 1 VIEW COMPARISON:  08/29/2020 FINDINGS: Mild right lower lung opacity. Left lung is clear. No pleural effusion or pneumothorax. The heart is normal in size IMPRESSION: Mild right lower lobe opacity, compatible with pneumonia in this patient with known COVID. Electronically Signed   By: Julian Hy M.D.   On: 07/06/2021 19:26    Scheduled Meds:  [MAR Hold] acetaminophen  1,000 mg Oral On Call to OR   acetaminophen  1,000 mg Oral On Call to OR   [facility] Hold] alvimopan  12 mg Oral On Call to OR   alvimopan  12 mg Oral On Call to OR   [facility] Hold] vitamin C  500 mg Oral Daily   [MAR Hold] bupivacaine liposome  20 mL Infiltration On Call  to OR   bupivacaine liposome  20 mL Infiltration Once   [MAR Hold] doxepin  75 mg Oral QHS   [MAR Hold] enoxaparin (LOVENOX) injection  40 mg Subcutaneous On Call to OR   feeding supplement  296 mL Oral Once   [MAR Hold] FLUoxetine  60 mg Oral q morning   gabapentin  300 mg Oral On Call to OR   [MAR Hold] heparin  5,000 Units Subcutaneous Q8H   [MAR Hold] Ipratropium-Albuterol  1 puff Inhalation TID   [MAR Hold] linaclotide  290 mcg Oral QAC breakfast   neomycin  1,000 mg Oral 3 times per day   [MAR Hold] pantoprazole  40 mg Oral BID   [MAR Hold] polyethylene glycol  17 g  Oral BID   [MAR Hold] potassium chloride  40 mEq Oral Once   [MAR Hold] sucralfate  1 g Oral BID   [MAR Hold] zinc sulfate  220 mg Oral Daily    Continuous Infusions:  cefoTEtan (CEFOTAN) IV     dextrose 5 % and 0.9 % NaCl with KCl 20 mEq/L Stopped (07/07/21 1151)   [MAR Hold] remdesivir 100 mg in NS 100 mL 100 mg (07/07/21 1150)     LOS: 1 day     Alma Friendly, MD [facility]  If 7PM-7AM, please contact night-coverage [email or web address] 07/07/2021, 1:20 PM

## 2021-07-07 NOTE — Transfer of Care (Signed)
Immediate Anesthesia Transfer of Care Note  Patient: Emily Park  Procedure(s) Performed: XI ROBOT ASSISTED RECTOPEXY XI ROBOTIC ASSISTED LOW ANTERIOR RECTOSIGMOID RESECTION WITH RIGID PROCTOSCOPY, TAP BLOCK  Patient Location: PACU  Anesthesia Type:General  Level of Consciousness: drowsy and patient cooperative  Airway & Oxygen Therapy: Patient Spontanous Breathing and Patient connected to face mask oxygen  Post-op Assessment: Report given to RN and Post -op Vital signs reviewed and stable  Post vital signs: Reviewed and stable  Last Vitals:  Vitals Value Taken Time  BP 119/72 07/07/21 1750  Temp    Pulse 89 07/07/21 1755  Resp 22 07/07/21 1755  SpO2 90 % 07/07/21 1755  Vitals shown include unvalidated device data.  Last Pain:  Vitals:   07/07/21 1214  TempSrc:   PainSc: 4       Patients Stated Pain Goal: 0 (61/47/09 2957)  Complications: No notable events documented.

## 2021-07-07 NOTE — Discharge Instructions (Signed)
SURGERY: POST OP INSTRUCTIONS (Surgery for small bowel obstruction, colon resection, etc)   ######################################################################  EAT Gradually transition to a high fiber diet with a fiber supplement over the next few days after discharge  WALK Walk an hour a day.  Control your pain to do that.    CONTROL PAIN Control pain so that you can walk, sleep, tolerate sneezing/coughing, go up/down stairs.  HAVE A BOWEL MOVEMENT DAILY Keep your bowels regular to avoid problems.  OK to try a laxative to override constipation.  OK to use an antidairrheal to slow down diarrhea.  Call if not better after 2 tries  CALL IF YOU HAVE PROBLEMS/CONCERNS Call if you are still struggling despite following these instructions. Call if you have concerns not answered by these instructions  ######################################################################   DIET Follow a light diet the first few days at home.  Start with a bland diet such as soups, liquids, starchy foods, low fat foods, etc.  If you feel full, bloated, or constipated, stay on a ful liquid or pureed/blenderized diet for a few days until you feel better and no longer constipated. Be sure to drink plenty of fluids every day to avoid getting dehydrated (feeling dizzy, not urinating, etc.). Gradually add a fiber supplement to your diet over the next week.  Gradually get back to a regular solid diet.  Avoid fast food or heavy meals the first week as you are more likely to get nauseated. It is expected for your digestive tract to need a few months to get back to normal.  It is common for your bowel movements and stools to be irregular.  You will have occasional bloating and cramping that should eventually fade away.  Until you are eating solid food normally, off all pain medications, and back to regular activities; your bowels will not be normal. Focus on eating a low-fat, high fiber diet the rest of your life  (See Getting to [place], below).  CARE of your INCISION or WOUND It is good for closed incision and even open wounds to be washed every day.  Shower every day.  Short baths are fine.  Wash the incisions and wounds clean with soap & water.     If you have a closed incision(s), wash the incision with soap & water every day.  You may leave closed incisions open to air if it is dry.   You may cover the incision with clean gauze & replace it after your daily shower for comfort.  It is good for closed incisions and even open wounds to be washed every day.  Shower every day.  Short baths are fine.  Wash the incisions and wounds clean with soap & water.    You may leave closed incisions open to air if it is dry.   You may cover the incision with clean gauze & replace it after your daily shower for comfort.  TEGADERM:  You have clear gauze band-aid dressings over your closed incision(s).  Remove the dressings 3 days after surgery.   If you have an open wound with a wound vac, see wound vac care instructions.     ACTIVITIES as tolerated Start light daily activities --- self-care, walking, climbing stairs-- beginning the day after surgery.  Gradually increase activities as tolerated.  Control your pain to be active.  Stop when you are tired.  Ideally, walk several times a day, eventually an hour a day.   Most people are back to most day-to-day activities in  a few weeks.  It takes 4-8 weeks to get back to unrestricted, intense activity. If you can walk 30 minutes without difficulty, it is safe to try more intense activity such as jogging, treadmill, bicycling, low-impact aerobics, swimming, etc. Save the most intensive and strenuous activity for last (Usually 4-8 weeks after surgery) such as sit-ups, heavy lifting, contact sports, etc.  Refrain from any intense heavy lifting or straining until you are off narcotics for pain control.  You will have off days, but things should improve  week-by-week. DO NOT PUSH THROUGH PAIN.  Let pain be your guide: If it hurts to do something, don't do it.  Pain is your body warning you to avoid that activity for another week until the pain goes down. You may drive when you are no longer taking narcotic prescription pain medication, you can comfortably wear a seatbelt, and you can safely make sudden turns/stops to protect yourself without hesitating due to pain. You may have sexual intercourse when it is comfortable. If it hurts to do something, stop.  MEDICATIONS Take your usually prescribed home medications unless otherwise directed.   Blood thinners:  Usually you can restart any strong blood thinners after the second postoperative day.  It is OK to take aspirin right away.     If you are on strong blood thinners (warfarin/Coumadin, Plavix, Xerelto, Eliquis, Pradaxa, etc), discuss with your surgeon, medicine PCP, and/or cardiologist for instructions on when to restart the blood thinner & if blood monitoring is needed (PT/INR blood check, etc).     PAIN CONTROL Pain after surgery or related to activity is often due to strain/injury to muscle, tendon, nerves and/or incisions.  This pain is usually short-term and will improve in a few months.  To help speed the process of healing and to get back to regular activity more quickly, DO THE FOLLOWING THINGS TOGETHER: Increase activity gradually.  DO NOT PUSH THROUGH PAIN Use Ice and/or Heat Try Gentle Massage and/or Stretching Take over the counter pain medication Take Narcotic prescription pain medication for more severe pain  Good pain control = faster recovery.  It is better to take more medicine to be more active than to stay in bed all day to avoid medications.  Increase activity gradually Avoid heavy lifting at first, then increase to lifting as tolerated over the next 6 weeks. Do not "push through" the pain.  Listen to your body and avoid positions and maneuvers than reproduce the pain.   Wait a few days before trying something more intense Walking an hour a day is encouraged to help your body recover faster and more safely.  Start slowly and stop when getting sore.  If you can walk 30 minutes without stopping or pain, you can try more intense activity (running, jogging, aerobics, cycling, swimming, treadmill, sex, sports, weightlifting, etc.) Remember: If it hurts to do it, then don't do it! Use Ice and/or Heat You will have swelling and bruising around the incisions.  This will take several weeks to resolve. Ice packs or heating pads (6-8 times a day, 30-60 minutes at a time) will help sooth soreness & bruising. Some people prefer to use ice alone, heat alone, or alternate between ice & heat.  Experiment and see what works best for you.  Consider trying ice for the first few days to help decrease swelling and bruising; then, switch to heat to help relax sore spots and speed recovery. Shower every day.  Short baths are fine.  It feels good!  Keep the incisions and wounds clean with soap & water.   Try Gentle Massage and/or Stretching Massage at the area of pain many times a day Stop if you feel pain - do not overdo it Take over the counter pain medication This helps the muscle and nerve tissues become less irritable and calm down faster Choose ONE of the following over-the-counter anti-inflammatory medications: Acetaminophen 578m tabs (Tylenol) 1-2 pills with every meal and just before bedtime (avoid if you have liver problems or if you have acetaminophen in you narcotic prescription) Naproxen 227mtabs (ex. Aleve, Naprosyn) 1-2 pills twice a day (avoid if you have kidney, stomach, IBD, or bleeding problems) Ibuprofen 20073mabs (ex. Advil, Motrin) 3-4 pills with every meal and just before bedtime (avoid if you have kidney, stomach, IBD, or bleeding problems) Take with food/snack several times a day as directed for at least 2 weeks to help keep pain / soreness down & more  manageable. Take Narcotic prescription pain medication for more severe pain A prescription for strong pain control is often given to you upon discharge (for example: oxycodone/Percocet, hydrocodone/Norco/Vicodin, or tramadol/Ultram) Take your pain medication as prescribed. Be mindful that most narcotic prescriptions contain Tylenol (acetaminophen) as well - avoid taking too much Tylenol. If you are having problems/concerns with the prescription medicine (does not control pain, nausea, vomiting, rash, itching, etc.), please call us [name] see if we need to switch you to a different pain medicine that will work better for you and/or control your side effects better. If you need a refill on your pain medication, you must call the office before 4 pm and on weekdays only.  By federal law, prescriptions for narcotics cannot be called into a pharmacy.  They must be filled out on paper & picked up from our office by the patient or authorized caretaker.  Prescriptions cannot be filled after 4 pm nor on weekends.    WHEN TO CALL US [name] uncontrolled or worsening pain  Fever over 101 F (38.5 C) Concerns with the incision: Worsening pain, redness, rash/hives, swelling, bleeding, or drainage Reactions / problems with new medications (itching, rash, hives, nausea, etc.) Nausea and/or vomiting Difficulty urinating Difficulty breathing Worsening fatigue, dizziness, lightheadedness, blurred vision Other concerns If you are not getting better after two weeks or are noticing you are getting worse, contact our office [phone] for further advice.  We may need to adjust your medications, re-evaluate you in the office, send you to the emergency room, or see what other things we can do to help. The clinic staff is available to answer your questions during regular business hours (8:30am-5pm).  Please don't hesitate to call and ask to speak to one of our nurses for clinical concerns.    A  surgeon from [facility] is always on call at the hospitals 24 hours/day If you have a medical emergency, go to the nearest emergency room or call 911.  FOLLOW UP in our office One the day of your discharge from the hospital (or the next business weekday), please call [facility] to set up or confirm an appointment to see your surgeon in the office for a follow-up appointment.  Usually it is 2-3 weeks after your surgery.   If you have skin staples at your incision(s), let the office know so we can set up a time in the office for the nurse to remove them (usually around 10 days after surgery). Make sure that you call for  appointments the day of discharge (or the next business weekday) from the hospital to ensure a convenient appointment time. IF YOU HAVE DISABILITY OR FAMILY LEAVE FORMS, BRING THEM TO THE OFFICE FOR PROCESSING.  DO NOT GIVE THEM TO YOUR DOCTOR.  [facility] [address] [place], [place], [place]  [address] ? [phone] - Main [phone] - [place],  [phone] - Fax [email or web address]  GETTING TO GOOD BOWEL HEALTH. It is expected for your digestive tract to need a few months to get back to normal.  It is common for your bowel movements and stools to be irregular.  You will have occasional bloating and cramping that should eventually fade away.  Until you are eating solid food normally, off all pain medications, and back to regular activities; your bowels will not be normal.   Avoiding constipation The goal: ONE SOFT BOWEL MOVEMENT A DAY!    Drink plenty of fluids.  Choose water first. TAKE A FIBER SUPPLEMENT EVERY DAY THE REST OF YOUR LIFE During your first week back home, gradually add back a fiber supplement every day Experiment which form you can tolerate.   There are many forms such as powders, tablets, wafers, gummies, etc Psyllium bran (Metamucil), methylcellulose (Citrucel), Miralax or Glycolax,  Benefiber, Flax Seed.  Adjust the dose week-by-week (1/2 dose/day to 6 doses a day) until you are moving your bowels 1-2 times a day.  Cut back the dose or try a different fiber product if it is giving you problems such as diarrhea or bloating. Sometimes a laxative is needed to help jump-start bowels if constipated until the fiber supplement can help regulate your bowels.  If you are tolerating eating & you are farting, it is okay to try a gentle laxative such as double dose MiraLax, prune juice, or Milk of Magnesia.  Avoid using laxatives too often. Stool softeners can sometimes help counteract the constipating effects of narcotic pain medicines.  It can also cause diarrhea, so avoid using for too long. If you are still constipated despite taking fiber daily, eating solids, and a few doses of laxatives, call our office. Controlling diarrhea Try drinking liquids and eating bland foods for a few days to avoid stressing your intestines further. Avoid dairy products (especially milk & ice cream) for a short time.  The intestines often can lose the ability to digest lactose when stressed. Avoid foods that cause gassiness or bloating.  Typical foods include beans and other legumes, cabbage, broccoli, and dairy foods.  Avoid greasy, spicy, fast foods.  Every person has some sensitivity to other foods, so listen to your body and avoid those foods that trigger problems for you. Probiotics (such as active yogurt, Align, etc) may help repopulate the intestines and colon with normal bacteria and calm down a sensitive digestive tract Adding a fiber supplement gradually can help thicken stools by absorbing excess fluid and retrain the intestines to act more normally.  Slowly increase the dose over a few weeks.  Too much fiber too soon can backfire and cause cramping & bloating. It is okay to try and slow down diarrhea with a few doses of antidiarrheal medicines.   Bismuth subsalicylate (ex. Kayopectate, Pepto Bismol)  for a few doses can help control diarrhea.  Avoid if pregnant.   Loperamide (Imodium) can slow down diarrhea.  Start with one tablet (47m) first.  Avoid if you are having fevers or severe pain.  ILEOSTOMY PATIENTS WILL HAVE CHRONIC DIARRHEA since their colon is  not in use.    Drink plenty of liquids.  You will need to drink even more glasses of water/liquid a day to avoid getting dehydrated. Record output from your ileostomy.  Expect to empty the bag every 3-4 hours at first.  Most people with a permanent ileostomy empty their bag 4-6 times at the least.   Use antidiarrheal medicine (especially Imodium) several times a day to avoid getting dehydrated.  Start with a dose at bedtime & breakfast.  Adjust up or down as needed.  Increase antidiarrheal medications as directed to avoid emptying the bag more than 8 times a day (every 3 hours). Work with your wound ostomy nurse to learn care for your ostomy.  See ostomy care instructions. TROUBLESHOOTING IRREGULAR BOWELS 1) Start with a soft & bland diet. No spicy, greasy, or fried foods.  2) Avoid gluten/wheat or dairy products from diet to see if symptoms improve. 3) Miralax 17gm or flax seed mixed in [place]. water or juice-daily. May use 2-4 times a day as needed. 4) Gas-X, Phazyme, etc. as needed for gas & bloating.  5) Prilosec (omeprazole) over-the-counter as needed 6)  Consider probiotics (Align, Activa, etc) to help calm the bowels down  Call your doctor if you are getting worse or not getting better.  Sometimes further testing (cultures, endoscopy, X-ray studies, CT scans, bloodwork, etc.) may be needed to help diagnose and treat the cause of the diarrhea. [facility], [place], [place], [place], [place]  [address] [phone] - Main.    [phone]  - Toll Free.   [phone] - Fax [email or web address]    #########################################   Pelvic floor muscle training exercises  ("Kegels") can help strengthen the muscles under the uterus, bladder, and bowel (large intestine). They can help both men and women who have problems with urine leakage or bowel control.  A pelvic floor muscle training exercise is like pretending that you have to urinate, and then holding it. You relax and tighten the muscles that control urine flow. It's important to find the right muscles to tighten.  The next time you have to urinate, start to go and then stop. Feel the muscles in your vagina, bladder, or anus get tight and move up. These are the pelvic floor muscles. If you feel them tighten, you've done the exercise right. If you are still not sure whether you are tightening the right muscles, keep in mind that all of the muscles of the pelvic floor relax and contract at the same time. Because these muscles control the bladder, rectum, and vagina, the following tips may help: Women: Insert a finger into your vagina. Tighten the muscles as if you are holding in your urine, then let go. You should feel the muscles tighten and move up and down.  Men: Insert a finger into your rectum. Tighten the muscles as if you are holding in your urine, then let go. You should feel the muscles tighten and move up and down. These are the same muscles you would tighten if you were trying to prevent yourself from passing gas.  It is very important that you keep the following muscles relaxed while doing pelvic floor muscle training exercises: Abdominal  Buttocks (the deeper, anal sphincter muscle should contract)  Thigh   A woman can also strengthen these muscles by using a vaginal cone, which is a weighted device that is inserted into the vagina. Then you try to tighten the pelvic floor muscles to hold the device  in place. If you are unsure whether you are doing the pelvic floor muscle training correctly, you can use biofeedback and electrical stimulation to help find the correct muscle group to work. Biofeedback  is a method of positive reinforcement. Electrodes are placed on the abdomen and along the anal area. Some therapists place a sensor in the vagina in women or anus in men to monitor the contraction of pelvic floor muscles.  A monitor will display a graph showing which muscles are contracting and which are at rest. The therapist can help find the right muscles for performing pelvic floor muscle training exercises.   PERFORMING PELVIC FLOOR EXERCISES: 1. Begin by emptying your bladder. 2. Tighten the pelvic floor muscles and hold for a count of 10. 3. Relax the muscles completely for a count of 10. 4. Do 10 repititions, 3 to 5 times a day (morning, afternoon, and night). You can do these exercises at any time and any place. Most people prefer to do the exercises while lying down or sitting in a chair. After 4 - 6 weeks, most people notice some improvement. It may take as long as 3 months to see a major change. After a couple of weeks, you can also try doing a single pelvic floor contraction at times when you are likely to leak (for example, while getting out of a chair). A word of caution: Some people feel that they can speed up the progress by increasing the number of repetitions and the frequency of exercises. However, over-exercising can instead cause muscle fatigue and increase urine leakage. If you feel any discomfort in your abdomen or back while doing these exercises, you are probably doing them wrong. Breathe deeply and relax your body when you are doing these exercises. Make sure you are not tightening your stomach, thigh, buttock, or chest muscles. When done the right way, pelvic floor muscle exercises have been shown to be very effective at improving urinary continence.  Pelvic Floor Pain / Incontinence  Do you suffer from pelvic pain or incontinence? Do you have pain in the pelvis, low back or hips that is associated with sitting, walking, urination or intercourse? Have you experienced  leaking of urine or feces when coughing, sneezing or laughing? Do you have pain in the pelvic area associated with cancer?  These are conditions that are common with pelvic floor muscle dysfunction. Over time, due to stress, scar tissue, surgeries and the natural course of aging, our muscles may become weak or overstressed and can spasm. This can lead to pain, weakness, incontinence or decreased quality of life.  Men and women with pelvic floor dysfunction frequently describe:  A "falling out" feeling. Pain or burning in the abdomen, tailbone or perineal area. Constipation or bowel elimination problems or difficulty initiating urination. Unresolved low back or hip pain. Frequency and urgency when going to the bathroom. Leaking of urine or feces. Pain with intercourse.  [email or web address]  To make a referral or for more information about [facility] Pelvic Floor Therapy Program, call  [facility]) - [place] [phone] [place]) - [facility]) - [phone]

## 2021-07-08 ENCOUNTER — Ambulatory Visit: Payer: Commercial Managed Care - PPO

## 2021-07-08 ENCOUNTER — Encounter (HOSPITAL_COMMUNITY): Payer: Self-pay | Admitting: Surgery

## 2021-07-08 DIAGNOSIS — J1282 Pneumonia due to coronavirus disease 2019: Secondary | ICD-10-CM | POA: Diagnosis not present

## 2021-07-08 DIAGNOSIS — U071 COVID-19: Secondary | ICD-10-CM | POA: Diagnosis not present

## 2021-07-08 DIAGNOSIS — K623 Rectal prolapse: Secondary | ICD-10-CM | POA: Diagnosis not present

## 2021-07-08 LAB — CBC WITH DIFFERENTIAL/PLATELET
Abs Immature Granulocytes: 0.03 10*3/uL (ref 0.00–0.07)
Basophils Absolute: 0 10*3/uL (ref 0.0–0.1)
Basophils Relative: 0 %
Eosinophils Absolute: 0 10*3/uL (ref 0.0–0.5)
Eosinophils Relative: 0 %
HCT: 31.6 % — ABNORMAL LOW (ref 36.0–46.0)
Hemoglobin: 9.7 g/dL — ABNORMAL LOW (ref 12.0–15.0)
Immature Granulocytes: 0 %
Lymphocytes Relative: 15 %
Lymphs Abs: 1.2 10*3/uL (ref 0.7–4.0)
MCH: 28.2 pg (ref 26.0–34.0)
MCHC: 30.7 g/dL (ref 30.0–36.0)
MCV: 91.9 fL (ref 80.0–100.0)
Monocytes Absolute: 0.7 10*3/uL (ref 0.1–1.0)
Monocytes Relative: 9 %
Neutro Abs: 6.1 10*3/uL (ref 1.7–7.7)
Neutrophils Relative %: 76 %
Platelets: 336 10*3/uL (ref 150–400)
RBC: 3.44 MIL/uL — ABNORMAL LOW (ref 3.87–5.11)
RDW: 22 % — ABNORMAL HIGH (ref 11.5–15.5)
WBC: 8 10*3/uL (ref 4.0–10.5)
nRBC: 0 % (ref 0.0–0.2)

## 2021-07-08 LAB — COMPREHENSIVE METABOLIC PANEL
ALT: 17 U/L (ref 0–44)
AST: 29 U/L (ref 15–41)
Albumin: 2.7 g/dL — ABNORMAL LOW (ref 3.5–5.0)
Alkaline Phosphatase: 47 U/L (ref 38–126)
Anion gap: 8 (ref 5–15)
BUN: <5 mg/dL — ABNORMAL LOW (ref 6–20)
CO2: 25 mmol/L (ref 22–32)
Calcium: 7.9 mg/dL — ABNORMAL LOW (ref 8.9–10.3)
Chloride: 104 mmol/L (ref 98–111)
Creatinine, Ser: 0.81 mg/dL (ref 0.44–1.00)
GFR, Estimated: >60 mL/min (ref >60)
Glucose, Bld: 127 mg/dL — ABNORMAL HIGH (ref 70–99)
Potassium: 3.9 mmol/L (ref 3.5–5.1)
Sodium: 137 mmol/L (ref 135–145)
Total Bilirubin: 0.2 mg/dL — ABNORMAL LOW (ref 0.3–1.2)
Total Protein: 5.8 g/dL — ABNORMAL LOW (ref 6.5–8.1)

## 2021-07-08 LAB — PROCALCITONIN: Procalcitonin: <0.1 ng/mL

## 2021-07-08 LAB — D-DIMER, QUANTITATIVE: D-Dimer, Quant: 1.64 ug/mL-FEU — ABNORMAL HIGH (ref 0.00–0.50)

## 2021-07-08 LAB — C-REACTIVE PROTEIN: CRP: 2.8 mg/dL — ABNORMAL HIGH (ref <1.0)

## 2021-07-08 LAB — FERRITIN: Ferritin: 120 ng/mL (ref 11–307)

## 2021-07-08 LAB — MAGNESIUM: Magnesium: 1.3 mg/dL — ABNORMAL LOW (ref 1.7–2.4)

## 2021-07-08 LAB — PHOSPHORUS: Phosphorus: 3.1 mg/dL (ref 2.5–4.6)

## 2021-07-08 MED ORDER — MAGNESIUM SULFATE 4 GM/100ML IV SOLN
4.0000 g | Freq: Once | INTRAVENOUS | Status: AC
  Administered 2021-07-08: 4 g via INTRAVENOUS
  Filled 2021-07-08: qty 100

## 2021-07-08 MED ORDER — OXYCODONE HCL 5 MG PO TABS
5.0000 mg | ORAL_TABLET | ORAL | Status: DC | PRN
Start: 2021-07-08 — End: 2021-07-10
  Administered 2021-07-08: 5 mg via ORAL
  Administered 2021-07-09 – 2021-07-10 (×2): 10 mg via ORAL
  Filled 2021-07-08 (×2): qty 2
  Filled 2021-07-08: qty 1

## 2021-07-08 NOTE — Progress Notes (Signed)
Patient ID: Emily Park, female   DOB: Oct 02, 1970, 50 y.o.   MRN: [id number] [facility] Surgery Progress Note  1 Day Post-Op  Subjective: CC-  Abdomen sore but feeling ok. Tolerating clear liquids. Denies n/v. Passing some flatus, no BM.  Objective: Vital signs in last 24 hours: Temp:  [97.1 F (36.2 C)-99.1 F (37.3 C)] 97.9 F (36.6 C) (11/11 0409) Pulse Rate:  [81-96] 85 (11/11 0409) Resp:  [12-21] 16 (11/11 0409) BP: (104-122)/(68-83) 104/68 (11/11 0409) SpO2:  [92 %-99 %] 96 % (11/11 0409) Weight:  [66.5 kg] 66.5 kg (11/11 0421) Last BM Date: 07/07/21  Intake/Output from previous day: 11/10 0701 - 11/11 0700 In: 2684.8 [I.V.:2284.8; IV Piggyback:400] Out: 4196 [Urine:1525; Drains:80; Blood:100] Intake/Output this shift: No intake/output data recorded.  PE: Gen:  Alert, NAD Card:  RRR Pulm:  rate and effort normal on room air Abd: Soft, ND, appropriately tender, cdi dressings to lap incisions, honeycomb to Pfannenstiel incision cdi, JP drain with serosanguinous fluid  Lab Results:  Recent Labs    07/07/21 0452 07/08/21 0453  WBC 3.9* 8.0  HGB 9.4* 9.7*  HCT 30.5* 31.6*  PLT 286 336   BMET Recent Labs    07/07/21 0452 07/08/21 0453  NA 140 137  K 2.9* 3.9  CL 111 104  CO2 23 25  GLUCOSE 102* 127*  BUN 6 <5*  CREATININE 0.76 0.81  CALCIUM 7.9* 7.9*   PT/INR No results for input(s): LABPROT, INR in the last 72 hours. CMP     Component Value Date/Time   NA 137 07/08/2021 0453   K 3.9 07/08/2021 0453   CL 104 07/08/2021 0453   CO2 25 07/08/2021 0453   GLUCOSE 127 (H) 07/08/2021 0453   BUN <5 (L) 07/08/2021 0453   CREATININE 0.81 07/08/2021 0453   CREATININE 1.49 (H) 05/13/2021 1434   CALCIUM 7.9 (L) 07/08/2021 0453   PROT 5.8 (L) 07/08/2021 0453   ALBUMIN 2.7 (L) 07/08/2021 0453   AST 29 07/08/2021 0453   AST 19 05/13/2021 1434   ALT 17 07/08/2021 0453   ALT 11 05/13/2021 1434   ALKPHOS 47 07/08/2021 0453   BILITOT 0.2 (L)  07/08/2021 0453   BILITOT 0.3 05/13/2021 1434   GFRNONAA >60 07/08/2021 0453   GFRNONAA 42 (L) 05/13/2021 1434   GFRAA >60 11/14/2015 0533   Lipase     Component Value Date/Time   LIPASE 39 04/13/2021 1155       Studies/Results: DG Chest Port 1 View  Result Date: 07/06/2021 CLINICAL DATA:  Cough, COVID positive EXAM: PORTABLE CHEST 1 VIEW COMPARISON:  08/29/2020 FINDINGS: Mild right lower lung opacity. Left lung is clear. No pleural effusion or pneumothorax. The heart is normal in size IMPRESSION: Mild right lower lobe opacity, compatible with pneumonia in this patient with known COVID. Electronically Signed   By: Julian Hy M.D.   On: 07/06/2021 19:26    Anti-infectives: Anti-infectives (From admission, onward)    Start     Dose/Rate Route Frequency Ordered Stop   07/08/21 0200  cefoTEtan (CEFOTAN) 2 g in sodium chloride 0.9 % 100 mL IVPB        2 g 200 mL/hr over 30 Minutes Intravenous Every 12 hours 07/07/21 1927 07/08/21 0131   07/07/21 1000  remdesivir 100 mg in sodium chloride 0.9 % 100 mL IVPB       See Hyperspace for full Linked Orders Report.   100 mg 200 mL/hr over 30 Minutes Intravenous Daily 07/06/21 1226 07/11/21  2229   07/07/21 0600  gentamicin (GARAMYCIN) 300 mg in dextrose 5 % 100 mL IVPB  Status:  Discontinued       See Hyperspace for full Linked Orders Report.   5 mg/kg  59 kg 107.5 mL/hr over 60 Minutes Intravenous 60 min pre-op 07/06/21 1114 07/06/21 1211   07/07/21 0600  clindamycin (CLEOCIN) IVPB 900 mg  Status:  Discontinued       See Hyperspace for full Linked Orders Report.   900 mg 100 mL/hr over 30 Minutes Intravenous 60 min pre-op 07/06/21 1211 07/06/21 1733   07/07/21 0600  gentamicin (GARAMYCIN) 300 mg in dextrose 5 % 100 mL IVPB  Status:  Discontinued       See Hyperspace for full Linked Orders Report.   5 mg/kg  59 kg 107.5 mL/hr over 60 Minutes Intravenous 60 min pre-op 07/06/21 1211 07/06/21 1733   07/07/21 0600  cefoTEtan  (CEFOTAN) 2 g in sodium chloride 0.9 % 100 mL IVPB        2 g 200 mL/hr over 30 Minutes Intravenous On call to O.R. 07/06/21 1730 07/07/21 1400   07/06/21 2200  neomycin (MYCIFRADIN) tablet 1,000 mg  Status:  Discontinued       See Hyperspace for full Linked Orders Report.   1,000 mg Oral 3 times per day 07/06/21 1730 07/06/21 1733   07/06/21 2200  metroNIDAZOLE (FLAGYL) tablet 1,000 mg  Status:  Discontinued       See Hyperspace for full Linked Orders Report.   1,000 mg Oral 3 times per day 07/06/21 1730 07/06/21 1733   07/06/21 1400  neomycin (MYCIFRADIN) tablet 1,000 mg       See Hyperspace for full Linked Orders Report.   1,000 mg Oral 3 times per day 07/06/21 1114 07/07/21 1359   07/06/21 1400  metroNIDAZOLE (FLAGYL) tablet 1,000 mg       See Hyperspace for full Linked Orders Report.   1,000 mg Oral 3 times per day 07/06/21 1114 07/06/21 2155   07/06/21 1400  remdesivir 200 mg in sodium chloride 0.9% 250 mL IVPB       See Hyperspace for full Linked Orders Report.   200 mg 580 mL/hr over 30 Minutes Intravenous Once 07/06/21 1226 07/06/21 1705   07/06/21 1113  clindamycin (CLEOCIN) IVPB 900 mg  Status:  Discontinued       See Hyperspace for full Linked Orders Report.   900 mg 100 mL/hr over 30 Minutes Intravenous 60 min pre-op 07/06/21 1114 07/06/21 1211        Assessment/Plan Recurrent rectal prolapse POD#1 s/p ROBOTIC LOW ANTERIOR RECTOSIGMOID RESECTION, ROBOTIC RECTOPEXY, RIGID PROCTOSCOPY, TRANSVERSUS ABDOMINIS PLANE (TAP) BLOCK - BILATERAL, INTRAOPERATIVE ASSESSMENT OF TISSUE, VASCULAR PERFUSION USING ICG (indocyanine green) IMMUNOFLUORESCENCE 11/10 Dr. Johney Maine - advance to full liquids.  - continue JP drain and monitor output - currently serosanguinous. Can d/c JP prior to discharge - needs to mobilize, will ask PT to see - change tramadol to oxy PRN, patient states this works better for her   ID - none FEN - IVF, FLD VTE - scds, sq heparin Foley - none   COVID+  - per  TRH, on remdesivir Constipation - per GI, inzess 290 mcg once a day along with MiraLAX 17 g twice a day and will add Metamucil 1 packet twice a day ADHD Migraines Anxiety/depression Hx arrhythmia s/p ablation Gastroparesis GERD Hiatal hernia PUD   LOS: 2 days    Wellington [place], [facility] Surgery 07/08/2021,  11:38 AM Please see Amion for pager number during day hours 7:00am-4:30pm

## 2021-07-08 NOTE — Evaluation (Signed)
Physical Therapy Evaluation Patient Details Name: Emily Park MRN: [id number] DOB: 1971/04/11 Today's Date: 07/08/2021  History of Present Illness  50yo female who presented 11/7 with constipation and straining, 2 episodes of rectal prolapse in the past 2 months. Received robot assisted rectopexy and rectosigmoid resection 07/07/21. PMH ADD, arrhythmia, HLD, SVT, cardioversion  Clinical Impression   Patient received in bed, pleasant and cooperative, just still having a lot of pain. Did have to educate on log rolling for getting to/from EOB, able to perform well and otherwise moving very well today- just pain limited. Feel that mobility will rapidly improve with ongoing pain control. She and her husband have no concerns with mobility or return home at DC. VSS on RA. Left in bed with all needs met, RN aware of patient status. Will continue to follow in house to assist in progressing mobility.        Recommendations for follow up therapy are one component of a multi-disciplinary discharge planning process, led by the attending physician.  Recommendations may be updated based on patient status, additional functional criteria and insurance authorization.  Follow Up Recommendations No PT follow up    Assistance Recommended at Discharge PRN  Functional Status Assessment Patient has had a recent decline in their functional status and demonstrates the ability to make significant improvements in function in a reasonable and predictable amount of time.  Equipment Recommendations  None recommended by PT    Recommendations for Other Services       Precautions / Restrictions Precautions Precautions: Other (comment) Precaution Comments: abdominal incisions, JP drain, Covid+ Restrictions Weight Bearing Restrictions: No      Mobility  Bed Mobility Overal bed mobility: Needs Assistance Bed Mobility: Rolling;Sidelying to Sit;Sit to Sidelying Rolling: Independent Sidelying to sit: Min guard      Sit to sidelying: Min guard General bed mobility comments: cues for log rolling and side to sit/sit to side, extra time due to pain    Transfers Overall transfer level: Needs assistance Equipment used: None Transfers: Sit to/from Stand Sit to Stand: Supervision           General transfer comment: no physical assist given, able to manage all lines/JP drain easily with S/occasional cues for line management    Ambulation/Gait Ambulation/Gait assistance: Supervision Gait Distance (Feet): 12 Feet Assistive device: IV Pole Gait Pattern/deviations: Step-through pattern;Antalgic Gait velocity: decreased     General Gait Details: slow but steady with IV pole, gait distance pain limited; anticipate she will be able to progress gait distance well as pain improved  Stairs            Wheelchair Mobility    Modified Rankin (Stroke Patients Only)       Balance Overall balance assessment: Mild deficits observed, not formally tested                                           Pertinent Vitals/Pain Pain Assessment: 0-10 Pain Score: 8  Pain Location: abdomen/surgical site Pain Descriptors / Indicators: Aching;Discomfort;Sore Pain Intervention(s): Limited activity within patient's tolerance;Monitored during session;Premedicated before session    [employer] expects to be discharged to:: Private residence Living Arrangements: Spouse/significant other Available Help at Discharge: Family;Other (Comment);Available 24 hours/day (parents live down the street, sister could also help) Type of Home: House Home Access: Stairs to enter Entrance Stairs-Rails: Can reach both Entrance Stairs-Number of Steps: 3  Home Layout: One level Home Equipment: Grab bars - tub/shower Additional Comments: loves working in the yard, nures in [employer]; one son lives at home as well but also works. has a dog and a cat    Prior Function Prior Level of  Function : Independent/Modified Independent                     Hand Dominance        Extremity/Trunk Assessment   Upper Extremity Assessment Upper Extremity Assessment: Overall WFL for tasks assessed    Lower Extremity Assessment Lower Extremity Assessment: Overall WFL for tasks assessed    Cervical / Trunk Assessment Cervical / Trunk Assessment: Normal  Communication   Communication: No difficulties  Cognition Arousal/Alertness: Awake/alert Behavior During Therapy: WFL for tasks assessed/performed Overall Cognitive Status: Within Functional Limits for tasks assessed                                          General Comments General comments (skin integrity, edema, etc.): VSS on RA    Exercises     Assessment/Plan    PT Assessment Patient needs continued PT services  PT Problem List Decreased activity tolerance;Decreased mobility;Pain       PT Treatment Interventions DME instruction;Balance training;Gait training;Stair training;Functional mobility training;Patient/family education;Therapeutic activities;Therapeutic exercise;Manual techniques    PT Goals (Current goals can be found in the Care Plan section)  Acute Rehab PT Goals Patient Stated Goal: less pain PT Goal Formulation: With patient Time For Goal Achievement: 07/22/21 Potential to Achieve Goals: Good    Frequency Min 3X/week   Barriers to discharge        Co-evaluation               AM-PAC PT "6 Clicks" Mobility  Outcome Measure Help needed turning from your back to your side while in a flat bed without using bedrails?: A Little Help needed moving from lying on your back to sitting on the side of a flat bed without using bedrails?: A Little Help needed moving to and from a bed to a chair (including a wheelchair)?: None Help needed standing up from a chair using your arms (e.g., wheelchair or bedside chair)?: None Help needed to walk in hospital room?: None Help  needed climbing 3-5 steps with a railing? : A Little 6 Click Score: 21    End of Session   Activity Tolerance: Patient tolerated treatment well Patient left: in bed;with call bell/phone within reach;with family/visitor present Nurse Communication: Mobility status PT Visit Diagnosis: Pain;Difficulty in walking, not elsewhere classified (R26.2) Pain - Right/Left:  (midline) Pain - part of body:  (abdomen)    Time: 1430-1455 PT Time Calculation (min) (ACUTE ONLY): 25 min   Charges:   PT Evaluation $PT Eval Moderate Complexity: 1 Mod PT Treatments $Gait Training: 8-22 mins       Windell Norfolk, DPT, PN2   Supplemental Physical Therapist [place]    Pager [phone] Acute Rehab Office [phone]

## 2021-07-08 NOTE — Progress Notes (Signed)
PT eval complete, formal note pending- did very well with therapy and able to mobilize on a distant S to mod(I) level with IV pole. Anticipate mobility will only improve as pain gets better. No PT f/u or DME recommended, will continue to follow while she remains in house.   Windell Norfolk, DPT, PN2   Supplemental Physical Therapist [place]    Pager [phone] Acute Rehab Office [phone]

## 2021-07-08 NOTE — Progress Notes (Signed)
PROGRESS NOTE  Emily Park NOB:[id number] DOB: May 02, 1971 DOA: 07/04/2021 PCP: [name], MD  HPI/Recap of past 24 hours: Emily Park is a 50 y.o. female with medical history significant of IDA, chronic constipation, GERD, rectal prolapse. Presenting w/ rectal prolapse. Admitted to surgical service for surgery. During w/u it was noted that she was positive for COVID. Patient reports that she has had cough and achiness for the last week or so. She tried robitussin at home and it didn't help. COVID test X 2 negative at outside facility. She presented to the surgery service 2 days ago for her prolapse. It was noted that she was COVID positive at the time. She continued to have cough and episodes of hypoxia. TRH was consulted for COVID recs   Today, pt c/o post op abdominal pain, otherwise denies any SOB, cough, chest pain, fever/chills    Assessment/Plan: Principal Problem:   Rectal prolapse Active Problems:   Attention deficit hyperactivity disorder (ADHD)   Anxiety   Complete rectal prolapse   Covid- 19 pneumonia Currently saturating well on RA Afebrile with no leukocytosis Trend inflammatory markers Procalcitonin negative, continue trend CXR with mild RLL opacity Continue remdesivir IS, cough suppressant  Supplemental O2 prn Monitor closely  Hypokalemia/hypomagnesemia  Replace prn    Remainder per primary team Rectal prolapse, for surgical repair    Estimated body mass index is 25.16 kg/m as calculated from the following:   Height as of this encounter: 5\' 4"  (1.626 m).   Weight as of this encounter: 66.5 kg.     Code Status: Full  Family Communication: Discussed with husband at bedside  Disposition Plan: Status is: Inpatient  Remains inpatient appropriate because: Surgical need     Consultants: TRH  Procedures: Rectal prolapse repair  Antimicrobials: None  DVT prophylaxis: As per surgery   Objective: Vitals:   07/08/21 0034  07/08/21 0409 07/08/21 0421 07/08/21 1400  BP: 109/80 104/68  110/72  Pulse: 96 85  83  Resp: 14 16  17   Temp: 99.1 F (37.3 C) 97.9 F (36.6 C)  98.3 F (36.8 C)  TempSrc: Oral Oral  Oral  SpO2: 95% 96%  98%  Weight:   66.5 kg   Height:        Intake/Output Summary (Last 24 hours) at 07/08/2021 1749 Last data filed at 07/08/2021 0130 Gross per 24 hour  Intake 900 ml  Output 1380 ml  Net -480 ml   Filed Weights   07/04/21 1631 07/08/21 0421  Weight: 59 kg 66.5 kg    Exam: General: NAD  Cardiovascular: S1, S2 present Respiratory: CTAB Abdomen: Soft, nontender, nondistended, bowel sounds present, dressings c/d/i Musculoskeletal: No bilateral pedal edema noted Skin: Normal Psychiatry: Normal mood     Data Reviewed: CBC: Recent Labs  Lab 07/04/21 1718 07/07/21 0452 07/08/21 0453  WBC 11.3* 3.9* 8.0  NEUTROABS 7.8* 1.7 6.1  HGB 10.9* 9.4* 9.7*  HCT 33.5* 30.5* 31.6*  MCV 88.4 90.5 91.9  PLT 321 286 947   Basic Metabolic Panel: Recent Labs  Lab 07/04/21 1718 07/07/21 0452 07/08/21 0453  NA 138 140 137  K 3.1* 2.9* 3.9  CL 107 111 104  CO2 24 23 25   GLUCOSE 95 102* 127*  BUN 7 6 <5*  CREATININE 0.82 0.76 0.81  CALCIUM 8.5* 7.9* 7.9*  MG  --  1.7 1.3*  PHOS  --  2.4* 3.1   GFR: Estimated Creatinine Clearance: 77.1 mL/min (by C-G formula based on SCr of  0.81 mg/dL). Liver Function Tests: Recent Labs  Lab 07/04/21 1718 07/07/21 0452 07/08/21 0453  AST 23 20 29   ALT 16 13 17   ALKPHOS 61 50 47  BILITOT 0.5 0.2* 0.2*  PROT 6.9 5.4* 5.8*  ALBUMIN 3.5 2.5* 2.7*   No results for input(s): LIPASE, AMYLASE in the last 168 hours. No results for input(s): AMMONIA in the last 168 hours. Coagulation Profile: No results for input(s): INR, PROTIME in the last 168 hours. Cardiac Enzymes: No results for input(s): CKTOTAL, CKMB, CKMBINDEX, TROPONINI in the last 168 hours. BNP (last 3 results) No results for input(s): PROBNP in the last 8760  hours. HbA1C: Recent Labs    07/06/21 1359  HGBA1C 4.9   CBG: No results for input(s): GLUCAP in the last 168 hours. Lipid Profile: No results for input(s): CHOL, HDL, LDLCALC, TRIG, CHOLHDL, LDLDIRECT in the last 72 hours. Thyroid Function Tests: No results for input(s): TSH, T4TOTAL, FREET4, T3FREE, THYROIDAB in the last 72 hours. Anemia Panel: Recent Labs    07/07/21 0452 07/08/21 0453  FERRITIN 119 120   Urine analysis:    Component Value Date/Time   COLORURINE STRAW (A) 04/13/2021 1413   APPEARANCEUR CLOUDY (A) 04/13/2021 1413   LABSPEC 1.003 (L) 04/13/2021 1413   PHURINE 6.0 04/13/2021 1413   GLUCOSEU NEGATIVE 04/13/2021 1413   HGBUR SMALL (A) 04/13/2021 1413   BILIRUBINUR NEGATIVE 04/13/2021 1413   BILIRUBINUR Neg 02/25/2014 0858   KETONESUR NEGATIVE 04/13/2021 1413   PROTEINUR NEGATIVE 04/13/2021 1413   UROBILINOGEN 0.2 02/25/2014 0858   UROBILINOGEN 0.2 05/20/2009 0310   NITRITE POSITIVE (A) 04/13/2021 1413   LEUKOCYTESUR LARGE (A) 04/13/2021 1413   Sepsis Labs: @LABRCNTIP (procalcitonin:4,lacticidven:4)  ) Recent Results (from the past 240 hour(s))  COVID-19, Flu A+B and RSV ([facility])     Status: None   Collection Time: 07/01/21  4:30 PM  Result Value Ref Range Status   SARS-CoV-2, NAA Not Detected Not Detected Final   Influenza A, NAA Not Detected Not Detected Final   Influenza B, NAA Not Detected Not Detected Final   RSV, NAA Not Detected Not Detected Final   Test Information: Comment  Final    Comment: This nucleic acid amplification test was developed and its performance characteristics determined by [employer]. Nucleic acid amplification tests include RT-PCR and TMA. This test has not been FDA cleared or approved. This test has been authorized by FDA under an Emergency Use Authorization (EUA). This test is only authorized for the duration of time the declaration that circumstances exist justifying the authorization of the emergency use  of in vitro diagnostic tests for detection of SARS-CoV-2 virus and/or diagnosis of COVID-19 infection under section 564(b)(1) of the Act, 21 U.S.C. 793JQZ-0(S) (1), unless the authorization is terminated or revoked sooner. When diagnostic testing is negative, the possibility of a false negative result should be considered in the context of a patient's recent exposures and the presence of clinical signs and symptoms consistent with COVID-19. An individual without symptoms of COVID-19 and who is not shedding SARS-CoV-2 virus wo uld expect to have a negative (not detected) result in this assay.   Resp Panel by RT-PCR (Flu A&B, Covid) Nasopharyngeal Swab     Status: Abnormal   Collection Time: 07/04/21  7:30 PM   Specimen: Nasopharyngeal Swab; Nasopharyngeal(NP) swabs in vial transport medium  Result Value Ref Range Status   SARS Coronavirus 2 by RT PCR POSITIVE (A) NEGATIVE Final    Comment: CRITICAL RESULT CALLED TO, READ BACK BY  AND VERIFIED WITH:  Clarene Reamer RN 07/04/21 @ 2240 VS (NOTE) SARS-CoV-2 target nucleic acids are DETECTED.  The SARS-CoV-2 RNA is generally detectable in upper respiratory specimens during the acute phase of infection. Positive results are indicative of the presence of the identified virus, but do not rule out bacterial infection or co-infection with other pathogens not detected by the test. Clinical correlation with patient history and other diagnostic information is necessary to determine patient infection status. The expected result is Negative.  Fact Sheet for Patients: EntrepreneurPulse.com.au  Fact Sheet for Healthcare Providers: IncredibleEmployment.be  This test is not yet approved or cleared by the [place] FDA and  has been authorized for detection and/or diagnosis of SARS-CoV-2 by FDA under an Emergency Use Authorization (EUA).  This EUA will remain in effect (meaning th is test can be used) for the  duration of  the COVID-19 declaration under Section 564(b)(1) of the Act, 21 U.S.C. section 360bbb-3(b)(1), unless the authorization is terminated or revoked sooner.     Influenza A by PCR NEGATIVE NEGATIVE Final   Influenza B by PCR NEGATIVE NEGATIVE Final    Comment: (NOTE) The Xpert Xpress SARS-CoV-2/FLU/RSV plus assay is intended as an aid in the diagnosis of influenza from Nasopharyngeal swab specimens and should not be used as a sole basis for treatment. Nasal washings and aspirates are unacceptable for Xpert Xpress SARS-CoV-2/FLU/RSV testing.  Fact Sheet for Patients: EntrepreneurPulse.com.au  Fact Sheet for Healthcare Providers: IncredibleEmployment.be  This test is not yet approved or cleared by the [place] FDA and has been authorized for detection and/or diagnosis of SARS-CoV-2 by FDA under an Emergency Use Authorization (EUA). This EUA will remain in effect (meaning this test can be used) for the duration of the COVID-19 declaration under Section 564(b)(1) of the Act, 21 U.S.C. section 360bbb-3(b)(1), unless the authorization is terminated or revoked.  Performed at [facility] [place], [place] [address] [place], [place] [address]       Studies: No results found.  Scheduled Meds:  acetaminophen  1,000 mg Oral Q6H   amphetamine-dextroamphetamine  15 mg Oral Q breakfast   vitamin C  500 mg Oral Daily   doxepin  75 mg Oral QHS   enoxaparin (LOVENOX) injection  40 mg Subcutaneous Q24H   feeding supplement  237 mL Oral BID BM   FLUoxetine  60 mg Oral q morning   Ipratropium-Albuterol  1 puff Inhalation TID   linaclotide  290 mcg Oral QAC breakfast   lip balm  1 application Topical BID   multivitamin with minerals  1 tablet Oral q morning   niacin  100 mg Oral QHS   pantoprazole  40 mg Oral BID   polyethylene glycol  17 g Oral BID   potassium chloride  40 mEq Oral Once   sodium chloride flush  3 mL  Intravenous Q12H   sucralfate  1 g Oral BID   zinc sulfate  220 mg Oral Daily    Continuous Infusions:  sodium chloride     sodium chloride     dextrose 5 % and 0.9 % NaCl with KCl 20 mEq/L Stopped (07/07/21 1151)   methocarbamol (ROBAXIN) IV     remdesivir 100 mg in NS 100 mL 100 mg (07/08/21 1101)     LOS: 2 days     Alma Friendly, MD [facility]  If 7PM-7AM, please contact night-coverage [email or web address] 07/08/2021, 5:49 PM

## 2021-07-09 DIAGNOSIS — J1282 Pneumonia due to coronavirus disease 2019: Secondary | ICD-10-CM | POA: Diagnosis not present

## 2021-07-09 DIAGNOSIS — K623 Rectal prolapse: Secondary | ICD-10-CM | POA: Diagnosis not present

## 2021-07-09 DIAGNOSIS — U071 COVID-19: Secondary | ICD-10-CM | POA: Diagnosis not present

## 2021-07-09 LAB — COMPREHENSIVE METABOLIC PANEL
ALT: 16 U/L (ref 0–44)
AST: 28 U/L (ref 15–41)
Albumin: 2.5 g/dL — ABNORMAL LOW (ref 3.5–5.0)
Alkaline Phosphatase: 46 U/L (ref 38–126)
Anion gap: 5 (ref 5–15)
BUN: 5 mg/dL — ABNORMAL LOW (ref 6–20)
CO2: 28 mmol/L (ref 22–32)
Calcium: 7.9 mg/dL — ABNORMAL LOW (ref 8.9–10.3)
Chloride: 103 mmol/L (ref 98–111)
Creatinine, Ser: 0.58 mg/dL (ref 0.44–1.00)
GFR, Estimated: >60 mL/min (ref >60)
Glucose, Bld: 83 mg/dL (ref 70–99)
Potassium: 3.1 mmol/L — ABNORMAL LOW (ref 3.5–5.1)
Sodium: 136 mmol/L (ref 135–145)
Total Bilirubin: 0.4 mg/dL (ref 0.3–1.2)
Total Protein: 5.2 g/dL — ABNORMAL LOW (ref 6.5–8.1)

## 2021-07-09 LAB — CBC WITH DIFFERENTIAL/PLATELET
Abs Immature Granulocytes: 0.03 10*3/uL (ref 0.00–0.07)
Basophils Absolute: 0 10*3/uL (ref 0.0–0.1)
Basophils Relative: 0 %
Eosinophils Absolute: 0.1 10*3/uL (ref 0.0–0.5)
Eosinophils Relative: 2 %
HCT: 28.2 % — ABNORMAL LOW (ref 36.0–46.0)
Hemoglobin: 8.8 g/dL — ABNORMAL LOW (ref 12.0–15.0)
Immature Granulocytes: 0 %
Lymphocytes Relative: 26 %
Lymphs Abs: 1.9 10*3/uL (ref 0.7–4.0)
MCH: 28.5 pg (ref 26.0–34.0)
MCHC: 31.2 g/dL (ref 30.0–36.0)
MCV: 91.3 fL (ref 80.0–100.0)
Monocytes Absolute: 0.6 10*3/uL (ref 0.1–1.0)
Monocytes Relative: 8 %
Neutro Abs: 4.5 10*3/uL (ref 1.7–7.7)
Neutrophils Relative %: 64 %
Platelets: 335 10*3/uL (ref 150–400)
RBC: 3.09 MIL/uL — ABNORMAL LOW (ref 3.87–5.11)
RDW: 22.1 % — ABNORMAL HIGH (ref 11.5–15.5)
WBC: 7.1 10*3/uL (ref 4.0–10.5)
nRBC: 0 % (ref 0.0–0.2)

## 2021-07-09 LAB — PROCALCITONIN: Procalcitonin: 0.1 ng/mL

## 2021-07-09 LAB — MAGNESIUM: Magnesium: 1.8 mg/dL (ref 1.7–2.4)

## 2021-07-09 LAB — PHOSPHORUS: Phosphorus: 2.1 mg/dL — ABNORMAL LOW (ref 2.5–4.6)

## 2021-07-09 LAB — FERRITIN: Ferritin: 129 ng/mL (ref 11–307)

## 2021-07-09 LAB — C-REACTIVE PROTEIN: CRP: 8.1 mg/dL — ABNORMAL HIGH (ref <1.0)

## 2021-07-09 LAB — D-DIMER, QUANTITATIVE: D-Dimer, Quant: 1.16 ug/mL-FEU — ABNORMAL HIGH (ref 0.00–0.50)

## 2021-07-09 MED ORDER — KETOROLAC TROMETHAMINE 30 MG/ML IJ SOLN
30.0000 mg | Freq: Three times a day (TID) | INTRAMUSCULAR | Status: DC | PRN
  Administered 2021-07-09: 30 mg via INTRAVENOUS
  Filled 2021-07-09: qty 1

## 2021-07-09 MED ORDER — HYDROMORPHONE HCL 1 MG/ML IJ SOLN
0.5000 mg | INTRAMUSCULAR | Status: DC | PRN
Start: 2021-07-09 — End: 2021-07-10

## 2021-07-09 MED ORDER — POTASSIUM CHLORIDE CRYS ER 20 MEQ PO TBCR
40.0000 meq | EXTENDED_RELEASE_TABLET | Freq: Once | ORAL | Status: AC
  Administered 2021-07-09: 40 meq via ORAL
  Filled 2021-07-09: qty 2

## 2021-07-09 NOTE — Progress Notes (Signed)
PROGRESS NOTE  Emily Park EXN:[id number] DOB: October 06, 1970 DOA: 07/04/2021 PCP: [name], MD  HPI/Recap of past 24 hours: Emily Park is a 50 y.o. female with medical history significant of IDA, chronic constipation, GERD, rectal prolapse. Presenting w/ rectal prolapse. Admitted to surgical service for surgery. During w/u it was noted that she was positive for COVID. Patient reports that she has had cough and achiness for the last week or so. She tried robitussin at home and it didn't help. COVID test X 2 negative at outside facility. She presented to the surgery service 2 days ago for her prolapse. It was noted that she was COVID positive at the time. She continued to have cough and episodes of hypoxia. [facility] was consulted for COVID recs   Today, pt denies any new complaints, continues to remain on RA, denies any SOB, cough or chest pain    Assessment/Plan: Principal Problem:   Rectal prolapse Active Problems:   Attention deficit hyperactivity disorder (ADHD)   Anxiety   Complete rectal prolapse   Covid- 19 pneumonia Currently saturating well on RA Afebrile with no leukocytosis Trend inflammatory markers Procalcitonin negative, continue trend CXR with mild RLL opacity Continue remdesivir for a total of 5 days IS, cough suppressant  Supplemental O2 prn Monitor closely  Hypokalemia/hypomagnesemia  Replace prn    Remainder per primary team Rectal prolapse, for surgical repair    Estimated body mass index is 25.35 kg/m as calculated from the following:   Height as of this encounter: 5\' 4"  (1.626 m).   Weight as of this encounter: 67 kg.     Code Status: Full  Family Communication: None at bedside  Disposition Plan: Status is: Inpatient  Remains inpatient appropriate because: Surgical need     Consultants: [facility]  Procedures: Rectal prolapse repair  Antimicrobials: None  DVT prophylaxis: As per surgery   Objective: Vitals:   07/08/21 2029  07/09/21 0500 07/09/21 0553 07/09/21 1414  BP: 121/76  110/77 119/81  Pulse: 96  90 84  Resp: 16  16 18   Temp: 98.6 F (37 C)  98.5 F (36.9 C) 97.8 F (36.6 C)  TempSrc: Oral  Oral Oral  SpO2: 98%  93% 100%  Weight:  67 kg    Height:        Intake/Output Summary (Last 24 hours) at 07/09/2021 1619 Last data filed at 07/09/2021 0630 Gross per 24 hour  Intake --  Output 125 ml  Net -125 ml   Filed Weights   07/04/21 1631 07/08/21 0421 07/09/21 0500  Weight: 59 kg 66.5 kg 67 kg    Exam: General: NAD  Cardiovascular: S1, S2 present Respiratory: CTAB Abdomen: Soft, nontender, nondistended, bowel sounds present, dressings c/d/i Musculoskeletal: No bilateral pedal edema noted Skin: Normal Psychiatry: Normal mood     Data Reviewed: CBC: Recent Labs  Lab 07/04/21 1718 07/07/21 0452 07/08/21 0453 07/09/21 0606  WBC 11.3* 3.9* 8.0 7.1  NEUTROABS 7.8* 1.7 6.1 4.5  HGB 10.9* 9.4* 9.7* 8.8*  HCT 33.5* 30.5* 31.6* 28.2*  MCV 88.4 90.5 91.9 91.3  PLT 321 286 336 496   Basic Metabolic Panel: Recent Labs  Lab 07/04/21 1718 07/07/21 0452 07/08/21 0453 07/09/21 0606  NA 138 140 137 136  K 3.1* 2.9* 3.9 3.1*  CL 107 111 104 103  CO2 24 23 25 28   GLUCOSE 95 102* 127* 83  BUN 7 6 <5* 5*  CREATININE 0.82 0.76 0.81 0.58  CALCIUM 8.5* 7.9* 7.9* 7.9*  MG  --  1.7 1.3* 1.8  PHOS  --  2.4* 3.1 2.1*   GFR: Estimated Creatinine Clearance: 78.3 mL/min (by C-G formula based on SCr of 0.58 mg/dL). Liver Function Tests: Recent Labs  Lab 07/04/21 1718 07/07/21 0452 07/08/21 0453 07/09/21 0606  AST 23 20 29 28   ALT 16 13 17 16   ALKPHOS 61 50 47 46  BILITOT 0.5 0.2* 0.2* 0.4  PROT 6.9 5.4* 5.8* 5.2*  ALBUMIN 3.5 2.5* 2.7* 2.5*   No results for input(s): LIPASE, AMYLASE in the last 168 hours. No results for input(s): AMMONIA in the last 168 hours. Coagulation Profile: No results for input(s): INR, PROTIME in the last 168 hours. Cardiac Enzymes: No results for  input(s): CKTOTAL, CKMB, CKMBINDEX, TROPONINI in the last 168 hours. BNP (last 3 results) No results for input(s): PROBNP in the last 8760 hours. HbA1C: No results for input(s): HGBA1C in the last 72 hours.  CBG: No results for input(s): GLUCAP in the last 168 hours. Lipid Profile: No results for input(s): CHOL, HDL, LDLCALC, TRIG, CHOLHDL, LDLDIRECT in the last 72 hours. Thyroid Function Tests: No results for input(s): TSH, T4TOTAL, FREET4, T3FREE, THYROIDAB in the last 72 hours. Anemia Panel: Recent Labs    07/08/21 0453 07/09/21 0606  FERRITIN 120 129   Urine analysis:    Component Value Date/Time   COLORURINE STRAW (A) 04/13/2021 1413   APPEARANCEUR CLOUDY (A) 04/13/2021 1413   LABSPEC 1.003 (L) 04/13/2021 1413   PHURINE 6.0 04/13/2021 1413   GLUCOSEU NEGATIVE 04/13/2021 1413   HGBUR SMALL (A) 04/13/2021 1413   BILIRUBINUR NEGATIVE 04/13/2021 1413   BILIRUBINUR Neg 02/25/2014 0858   KETONESUR NEGATIVE 04/13/2021 1413   PROTEINUR NEGATIVE 04/13/2021 1413   UROBILINOGEN 0.2 02/25/2014 0858   UROBILINOGEN 0.2 05/20/2009 0310   NITRITE POSITIVE (A) 04/13/2021 1413   LEUKOCYTESUR LARGE (A) 04/13/2021 1413   Sepsis Labs: @LABRCNTIP (procalcitonin:4,lacticidven:4)  ) Recent Results (from the past 240 hour(s))  COVID-19, Flu A+B and RSV ([facility])     Status: None   Collection Time: 07/01/21  4:30 PM  Result Value Ref Range Status   SARS-CoV-2, NAA Not Detected Not Detected Final   Influenza A, NAA Not Detected Not Detected Final   Influenza B, NAA Not Detected Not Detected Final   RSV, NAA Not Detected Not Detected Final   Test Information: Comment  Final    Comment: This nucleic acid amplification test was developed and its performance characteristics determined by [employer]. Nucleic acid amplification tests include RT-PCR and TMA. This test has not been FDA cleared or approved. This test has been authorized by FDA under an Emergency Use Authorization  (EUA). This test is only authorized for the duration of time the declaration that circumstances exist justifying the authorization of the emergency use of in vitro diagnostic tests for detection of SARS-CoV-2 virus and/or diagnosis of COVID-19 infection under section 564(b)(1) of the Act, 21 U.S.C. 694WNI-6(E) (1), unless the authorization is terminated or revoked sooner. When diagnostic testing is negative, the possibility of a false negative result should be considered in the context of a patient's recent exposures and the presence of clinical signs and symptoms consistent with COVID-19. An individual without symptoms of COVID-19 and who is not shedding SARS-CoV-2 virus wo uld expect to have a negative (not detected) result in this assay.   Resp Panel by RT-PCR (Flu A&B, Covid) Nasopharyngeal Swab     Status: Abnormal   Collection Time: 07/04/21  7:30 PM   Specimen:  Nasopharyngeal Swab; Nasopharyngeal(NP) swabs in vial transport medium  Result Value Ref Range Status   SARS Coronavirus 2 by RT PCR POSITIVE (A) NEGATIVE Final    Comment: CRITICAL RESULT CALLED TO, READ BACK BY AND VERIFIED WITH:  Clarene Reamer RN 07/04/21 @ 2240 VS (NOTE) SARS-CoV-2 target nucleic acids are DETECTED.  The SARS-CoV-2 RNA is generally detectable in upper respiratory specimens during the acute phase of infection. Positive results are indicative of the presence of the identified virus, but do not rule out bacterial infection or co-infection with other pathogens not detected by the test. Clinical correlation with patient history and other diagnostic information is necessary to determine patient infection status. The expected result is Negative.  Fact Sheet for Patients: EntrepreneurPulse.com.au  Fact Sheet for Healthcare Providers: IncredibleEmployment.be  This test is not yet approved or cleared by the [place] FDA and  has been authorized for detection  and/or diagnosis of SARS-CoV-2 by FDA under an Emergency Use Authorization (EUA).  This EUA will remain in effect (meaning th is test can be used) for the duration of  the COVID-19 declaration under Section 564(b)(1) of the Act, 21 U.S.C. section 360bbb-3(b)(1), unless the authorization is terminated or revoked sooner.     Influenza A by PCR NEGATIVE NEGATIVE Final   Influenza B by PCR NEGATIVE NEGATIVE Final    Comment: (NOTE) The Xpert Xpress SARS-CoV-2/FLU/RSV plus assay is intended as an aid in the diagnosis of influenza from Nasopharyngeal swab specimens and should not be used as a sole basis for treatment. Nasal washings and aspirates are unacceptable for Xpert Xpress SARS-CoV-2/FLU/RSV testing.  Fact Sheet for Patients: EntrepreneurPulse.com.au  Fact Sheet for Healthcare Providers: IncredibleEmployment.be  This test is not yet approved or cleared by the [place] FDA and has been authorized for detection and/or diagnosis of SARS-CoV-2 by FDA under an Emergency Use Authorization (EUA). This EUA will remain in effect (meaning this test can be used) for the duration of the COVID-19 declaration under Section 564(b)(1) of the Act, 21 U.S.C. section 360bbb-3(b)(1), unless the authorization is terminated or revoked.  Performed at [facility], [place] [address] [place], [place] [address]       Studies: No results found.  Scheduled Meds:  acetaminophen  1,000 mg Oral Q6H   amphetamine-dextroamphetamine  15 mg Oral Q breakfast   vitamin C  500 mg Oral Daily   doxepin  75 mg Oral QHS   enoxaparin (LOVENOX) injection  40 mg Subcutaneous Q24H   feeding supplement  237 mL Oral BID BM   FLUoxetine  60 mg Oral q morning   Ipratropium-Albuterol  1 puff Inhalation TID   linaclotide  290 mcg Oral QAC breakfast   lip balm  1 application Topical BID   multivitamin with minerals  1 tablet Oral q morning   niacin  100 mg  Oral QHS   pantoprazole  40 mg Oral BID   polyethylene glycol  17 g Oral BID   potassium chloride  40 mEq Oral Once   sodium chloride flush  3 mL Intravenous Q12H   sucralfate  1 g Oral BID   zinc sulfate  220 mg Oral Daily    Continuous Infusions:  sodium chloride     sodium chloride     dextrose 5 % and 0.9 % NaCl with KCl 20 mEq/L Stopped (07/07/21 1151)   methocarbamol (ROBAXIN) IV     remdesivir 100 mg in NS 100 mL 100 mg (07/09/21 1229)  LOS: 3 days     Alma Friendly, MD [facility]  If 7PM-7AM, please contact night-coverage [email or web address] 07/09/2021, 4:19 PM

## 2021-07-09 NOTE — Progress Notes (Signed)
2 Days Post-Op   Subjective/Chief Complaint: Complains of pain. Having bm's   Objective: Vital signs in last 24 hours: Temp:  [98.3 F (36.8 C)-98.6 F (37 C)] 98.5 F (36.9 C) (11/12 0553) Pulse Rate:  [83-96] 90 (11/12 0553) Resp:  [16-17] 16 (11/12 0553) BP: (110-121)/(72-77) 110/77 (11/12 0553) SpO2:  [93 %-98 %] 93 % (11/12 0553) Weight:  [67 kg] 67 kg (11/12 0500) Last BM Date: 07/09/21  Intake/Output from previous day: 11/11 0701 - 11/12 0700 In: -  Out: 125 [Drains:125] Intake/Output this shift: No intake/output data recorded.  General appearance: alert and cooperative Resp: clear to auscultation bilaterally Cardio: regular rate and rhythm GI: soft, moderate tenderness. Incisions look good  Lab Results:  Recent Labs    07/08/21 0453 07/09/21 0606  WBC 8.0 7.1  HGB 9.7* 8.8*  HCT 31.6* 28.2*  PLT 336 335   BMET Recent Labs    07/08/21 0453 07/09/21 0606  NA 137 136  K 3.9 3.1*  CL 104 103  CO2 25 28  GLUCOSE 127* 83  BUN <5* 5*  CREATININE 0.81 0.58  CALCIUM 7.9* 7.9*   PT/INR No results for input(s): LABPROT, INR in the last 72 hours. ABG No results for input(s): PHART, HCO3 in the last 72 hours.  Invalid input(s): PCO2, PO2  Studies/Results: No results found.  Anti-infectives: Anti-infectives (From admission, onward)    Start     Dose/Rate Route Frequency Ordered Stop   07/08/21 0200  cefoTEtan (CEFOTAN) 2 g in sodium chloride 0.9 % 100 mL IVPB        2 g 200 mL/hr over 30 Minutes Intravenous Every 12 hours 07/07/21 1927 07/08/21 0131   07/07/21 1000  remdesivir 100 mg in sodium chloride 0.9 % 100 mL IVPB       See Hyperspace for full Linked Orders Report.   100 mg 200 mL/hr over 30 Minutes Intravenous Daily 07/06/21 1226 07/11/21 0959   07/07/21 0600  gentamicin (GARAMYCIN) 300 mg in dextrose 5 % 100 mL IVPB  Status:  Discontinued       See Hyperspace for full Linked Orders Report.   5 mg/kg  59 kg 107.5 mL/hr over 60 Minutes  Intravenous 60 min pre-op 07/06/21 1114 07/06/21 1211   07/07/21 0600  clindamycin (CLEOCIN) IVPB 900 mg  Status:  Discontinued       See Hyperspace for full Linked Orders Report.   900 mg 100 mL/hr over 30 Minutes Intravenous 60 min pre-op 07/06/21 1211 07/06/21 1733   07/07/21 0600  gentamicin (GARAMYCIN) 300 mg in dextrose 5 % 100 mL IVPB  Status:  Discontinued       See Hyperspace for full Linked Orders Report.   5 mg/kg  59 kg 107.5 mL/hr over 60 Minutes Intravenous 60 min pre-op 07/06/21 1211 07/06/21 1733   07/07/21 0600  cefoTEtan (CEFOTAN) 2 g in sodium chloride 0.9 % 100 mL IVPB        2 g 200 mL/hr over 30 Minutes Intravenous On call to O.R. 07/06/21 1730 07/07/21 1400   07/06/21 2200  neomycin (MYCIFRADIN) tablet 1,000 mg  Status:  Discontinued       See Hyperspace for full Linked Orders Report.   1,000 mg Oral 3 times per day 07/06/21 1730 07/06/21 1733   07/06/21 2200  metroNIDAZOLE (FLAGYL) tablet 1,000 mg  Status:  Discontinued       See Hyperspace for full Linked Orders Report.   1,000 mg Oral 3 times per day 07/06/21  1730 07/06/21 1733   07/06/21 1400  neomycin (MYCIFRADIN) tablet 1,000 mg       See Hyperspace for full Linked Orders Report.   1,000 mg Oral 3 times per day 07/06/21 1114 07/07/21 1359   07/06/21 1400  metroNIDAZOLE (FLAGYL) tablet 1,000 mg       See Hyperspace for full Linked Orders Report.   1,000 mg Oral 3 times per day 07/06/21 1114 07/06/21 2155   07/06/21 1400  remdesivir 200 mg in sodium chloride 0.9% 250 mL IVPB       See Hyperspace for full Linked Orders Report.   200 mg 580 mL/hr over 30 Minutes Intravenous Once 07/06/21 1226 07/06/21 1705   07/06/21 1113  clindamycin (CLEOCIN) IVPB 900 mg  Status:  Discontinued       See Hyperspace for full Linked Orders Report.   900 mg 100 mL/hr over 30 Minutes Intravenous 60 min pre-op 07/06/21 1114 07/06/21 1211       Assessment/Plan: s/p Procedure(s): XI ROBOT ASSISTED RECTOPEXY (N/A) XI ROBOTIC  ASSISTED LOW ANTERIOR RECTOSIGMOID RESECTION WITH RIGID PROCTOSCOPY, TAP BLOCK (N/A) Advance diet Recurrent rectal prolapse POD#2 s/p ROBOTIC LOW ANTERIOR RECTOSIGMOID RESECTION, ROBOTIC RECTOPEXY, RIGID PROCTOSCOPY, TRANSVERSUS ABDOMINIS PLANE (TAP) BLOCK - BILATERAL, INTRAOPERATIVE ASSESSMENT OF TISSUE, VASCULAR PERFUSION USING ICG (indocyanine green) IMMUNOFLUORESCENCE 11/10 Dr. Johney Maine - advance to full liquids.  - continue JP drain and monitor output - currently serosanguinous. Can d/c JP prior to discharge - needs to mobilize, will ask PT to see - change tramadol to oxy PRN, patient states this works better for her   ID - none FEN - IVF, FLD VTE - scds, sq heparin Foley - none   COVID+  - per [facility], on remdesivir Constipation - per GI, inzess 290 mcg once a day along with MiraLAX 17 g twice a day and will add Metamucil 1 packet twice a day ADHD Migraines Anxiety/depression Hx arrhythmia s/p ablation Gastroparesis GERD Hiatal hernia PUD  LOS: 3 days    Autumn Messing III 07/09/2021

## 2021-07-10 DIAGNOSIS — U071 COVID-19: Secondary | ICD-10-CM | POA: Diagnosis not present

## 2021-07-10 DIAGNOSIS — J1282 Pneumonia due to coronavirus disease 2019: Secondary | ICD-10-CM | POA: Diagnosis not present

## 2021-07-10 DIAGNOSIS — K623 Rectal prolapse: Secondary | ICD-10-CM | POA: Diagnosis not present

## 2021-07-10 LAB — CBC WITH DIFFERENTIAL/PLATELET
Abs Immature Granulocytes: 0.03 10*3/uL (ref 0.00–0.07)
Basophils Absolute: 0 10*3/uL (ref 0.0–0.1)
Basophils Relative: 0 %
Eosinophils Absolute: 0.3 10*3/uL (ref 0.0–0.5)
Eosinophils Relative: 4 %
HCT: 28 % — ABNORMAL LOW (ref 36.0–46.0)
Hemoglobin: 8.9 g/dL — ABNORMAL LOW (ref 12.0–15.0)
Immature Granulocytes: 0 %
Lymphocytes Relative: 33 %
Lymphs Abs: 2.6 10*3/uL (ref 0.7–4.0)
MCH: 28.1 pg (ref 26.0–34.0)
MCHC: 31.8 g/dL (ref 30.0–36.0)
MCV: 88.3 fL (ref 80.0–100.0)
Monocytes Absolute: 0.5 10*3/uL (ref 0.1–1.0)
Monocytes Relative: 7 %
Neutro Abs: 4.6 10*3/uL (ref 1.7–7.7)
Neutrophils Relative %: 56 %
Platelets: 354 10*3/uL (ref 150–400)
RBC: 3.17 MIL/uL — ABNORMAL LOW (ref 3.87–5.11)
RDW: 22.1 % — ABNORMAL HIGH (ref 11.5–15.5)
WBC: 8 10*3/uL (ref 4.0–10.5)
nRBC: 0 % (ref 0.0–0.2)

## 2021-07-10 LAB — COMPREHENSIVE METABOLIC PANEL
ALT: 14 U/L (ref 0–44)
AST: 27 U/L (ref 15–41)
Albumin: 2.3 g/dL — ABNORMAL LOW (ref 3.5–5.0)
Alkaline Phosphatase: 46 U/L (ref 38–126)
Anion gap: 3 — ABNORMAL LOW (ref 5–15)
BUN: 8 mg/dL (ref 6–20)
CO2: 29 mmol/L (ref 22–32)
Calcium: 7.8 mg/dL — ABNORMAL LOW (ref 8.9–10.3)
Chloride: 107 mmol/L (ref 98–111)
Creatinine, Ser: 0.61 mg/dL (ref 0.44–1.00)
GFR, Estimated: >60 mL/min (ref >60)
Glucose, Bld: 84 mg/dL (ref 70–99)
Potassium: 3.3 mmol/L — ABNORMAL LOW (ref 3.5–5.1)
Sodium: 139 mmol/L (ref 135–145)
Total Bilirubin: 0.4 mg/dL (ref 0.3–1.2)
Total Protein: 4.8 g/dL — ABNORMAL LOW (ref 6.5–8.1)

## 2021-07-10 LAB — D-DIMER, QUANTITATIVE: D-Dimer, Quant: 0.94 ug/mL-FEU — ABNORMAL HIGH (ref 0.00–0.50)

## 2021-07-10 LAB — MAGNESIUM: Magnesium: 1.6 mg/dL — ABNORMAL LOW (ref 1.7–2.4)

## 2021-07-10 LAB — C-REACTIVE PROTEIN: CRP: 6.6 mg/dL — ABNORMAL HIGH (ref <1.0)

## 2021-07-10 LAB — FERRITIN: Ferritin: 151 ng/mL (ref 11–307)

## 2021-07-10 LAB — PHOSPHORUS: Phosphorus: 2 mg/dL — ABNORMAL LOW (ref 2.5–4.6)

## 2021-07-10 MED ORDER — OXYCODONE HCL 5 MG PO TABS
5.0000 mg | ORAL_TABLET | Freq: Four times a day (QID) | ORAL | 0 refills | Status: DC | PRN
Start: 2021-07-10 — End: 2022-02-22

## 2021-07-10 MED ORDER — POLYETHYLENE GLYCOL 3350 17 G PO PACK: 17.0000 g | PACK | Freq: Two times a day (BID) | ORAL | 0 refills | Status: AC

## 2021-07-10 MED ORDER — POTASSIUM CHLORIDE CRYS ER 20 MEQ PO TBCR
40.0000 meq | EXTENDED_RELEASE_TABLET | Freq: Two times a day (BID) | ORAL | Status: DC
  Administered 2021-07-10: 40 meq via ORAL
  Filled 2021-07-10: qty 2

## 2021-07-10 MED ORDER — ZINC SULFATE 220 (50 ZN) MG PO CAPS: 220.0000 mg | ORAL_CAPSULE | Freq: Every day | ORAL | 0 refills | Status: AC

## 2021-07-10 MED ORDER — K PHOS MONO-SOD PHOS DI & MONO 155-852-130 MG PO TABS
500.0000 mg | ORAL_TABLET | Freq: Three times a day (TID) | ORAL | Status: DC
  Administered 2021-07-10: 500 mg via ORAL
  Filled 2021-07-10 (×3): qty 2

## 2021-07-10 MED ORDER — MAGNESIUM SULFATE 2 GM/50ML IV SOLN
2.0000 g | Freq: Once | INTRAVENOUS | Status: AC
  Administered 2021-07-10: 2 g via INTRAVENOUS
  Filled 2021-07-10: qty 50

## 2021-07-10 NOTE — Progress Notes (Signed)
PROGRESS NOTE  Emily Park GYK:[id number] DOB: 08-30-1970 DOA: 07/04/2021 PCP: [name], MD  HPI/Recap of past 24 hours: Emily Park is a 50 y.o. female with medical history significant of IDA, chronic constipation, GERD, rectal prolapse. Presenting w/ rectal prolapse. Admitted to surgical service for surgery. During w/u it was noted that she was positive for COVID. Patient reports that she has had cough and achiness for the last week or so. She tried robitussin at home and it didn't help. COVID test X 2 negative at outside facility. She presented to the surgery service 2 days ago for her prolapse. It was noted that she was COVID positive at the time. She continued to have cough and episodes of hypoxia. TRH was consulted for COVID recs    Today, pt denies any new complaints, no SOB, cough, chest pain, fever/chills. Eager to be discharged. Stable to d/c from a medicine standpoint.    Assessment/Plan: Principal Problem:   Rectal prolapse Active Problems:   Attention deficit hyperactivity disorder (ADHD)   Anxiety   Complete rectal prolapse   Covid- 19 pneumonia Currently saturating well on RA Afebrile with no leukocytosis Procalcitonin negative CXR with mild RLL opacity Completed remdesivir for a total of 5 days Follow up with PCP  Hypokalemia/hypomagnesemia  Replaced prn Continue home K+ supplement    Remainder per primary team Rectal prolapse, for surgical repair    Medicine team/TRH signed off    Estimated body mass index is 24.75 kg/m as calculated from the following:   Height as of this encounter: 5\' 4"  (1.626 m).   Weight as of this encounter: 65.4 kg.     Code Status: Full  Family Communication: None at bedside  Disposition Plan: Status is: Inpatient  Remains inpatient appropriate because: Surgical need     Consultants: TRH  Procedures: Rectal prolapse repair  Antimicrobials: None  DVT prophylaxis: As per  surgery   Objective: Vitals:   07/09/21 1414 07/09/21 1955 07/10/21 0642 07/10/21 0645  BP: 119/81 104/73 99/76   Pulse: 84 89 83   Resp: 18 16 16    Temp: 97.8 F (36.6 C) 99.1 F (37.3 C) 98.6 F (37 C)   TempSrc: Oral Oral Oral   SpO2: 100% 96% 94%   Weight:    65.4 kg  Height:        Intake/Output Summary (Last 24 hours) at 07/10/2021 1356 Last data filed at 07/09/2021 2349 Gross per 24 hour  Intake 201.74 ml  Output --  Net 201.74 ml   Filed Weights   07/08/21 0421 07/09/21 0500 07/10/21 0645  Weight: 66.5 kg 67 kg 65.4 kg    Exam: General: NAD  Cardiovascular: S1, S2 present Respiratory: CTAB Abdomen: Soft, nontender, nondistended, bowel sounds present, dressings c/d/i Musculoskeletal: No bilateral pedal edema noted Skin: Normal Psychiatry: Normal mood     Data Reviewed: CBC: Recent Labs  Lab 07/04/21 1718 07/07/21 0452 07/08/21 0453 07/09/21 0606 07/10/21 0523  WBC 11.3* 3.9* 8.0 7.1 8.0  NEUTROABS 7.8* 1.7 6.1 4.5 4.6  HGB 10.9* 9.4* 9.7* 8.8* 8.9*  HCT 33.5* 30.5* 31.6* 28.2* 28.0*  MCV 88.4 90.5 91.9 91.3 88.3  PLT 321 286 336 335 793   Basic Metabolic Panel: Recent Labs  Lab 07/04/21 1718 07/07/21 0452 07/08/21 0453 07/09/21 0606 07/10/21 0523  NA 138 140 137 136 139  K 3.1* 2.9* 3.9 3.1* 3.3*  CL 107 111 104 103 107  CO2 24 23 25 28 29   GLUCOSE 95 102*  127* 83 84  BUN 7 6 <5* 5* 8  CREATININE 0.82 0.76 0.81 0.58 0.61  CALCIUM 8.5* 7.9* 7.9* 7.9* 7.8*  MG  --  1.7 1.3* 1.8 1.6*  PHOS  --  2.4* 3.1 2.1* 2.0*   GFR: Estimated Creatinine Clearance: 71.8 mL/min (by C-G formula based on SCr of 0.61 mg/dL). Liver Function Tests: Recent Labs  Lab 07/04/21 1718 07/07/21 0452 07/08/21 0453 07/09/21 0606 07/10/21 0523  AST 23 20 29 28 27   ALT 16 13 17 16 14   ALKPHOS 61 50 47 46 46  BILITOT 0.5 0.2* 0.2* 0.4 0.4  PROT 6.9 5.4* 5.8* 5.2* 4.8*  ALBUMIN 3.5 2.5* 2.7* 2.5* 2.3*   No results for input(s): LIPASE, AMYLASE in the  last 168 hours. No results for input(s): AMMONIA in the last 168 hours. Coagulation Profile: No results for input(s): INR, PROTIME in the last 168 hours. Cardiac Enzymes: No results for input(s): CKTOTAL, CKMB, CKMBINDEX, TROPONINI in the last 168 hours. BNP (last 3 results) No results for input(s): PROBNP in the last 8760 hours. HbA1C: No results for input(s): HGBA1C in the last 72 hours.  CBG: No results for input(s): GLUCAP in the last 168 hours. Lipid Profile: No results for input(s): CHOL, HDL, LDLCALC, TRIG, CHOLHDL, LDLDIRECT in the last 72 hours. Thyroid Function Tests: No results for input(s): TSH, T4TOTAL, FREET4, T3FREE, THYROIDAB in the last 72 hours. Anemia Panel: Recent Labs    07/09/21 0606 07/10/21 0523  FERRITIN 129 151   Urine analysis:    Component Value Date/Time   COLORURINE STRAW (A) 04/13/2021 1413   APPEARANCEUR CLOUDY (A) 04/13/2021 1413   LABSPEC 1.003 (L) 04/13/2021 1413   PHURINE 6.0 04/13/2021 1413   GLUCOSEU NEGATIVE 04/13/2021 1413   HGBUR SMALL (A) 04/13/2021 1413   BILIRUBINUR NEGATIVE 04/13/2021 1413   BILIRUBINUR Neg 02/25/2014 0858   KETONESUR NEGATIVE 04/13/2021 1413   PROTEINUR NEGATIVE 04/13/2021 1413   UROBILINOGEN 0.2 02/25/2014 0858   UROBILINOGEN 0.2 05/20/2009 0310   NITRITE POSITIVE (A) 04/13/2021 1413   LEUKOCYTESUR LARGE (A) 04/13/2021 1413   Sepsis Labs: @LABRCNTIP (procalcitonin:4,lacticidven:4)  ) Recent Results (from the past 240 hour(s))  COVID-19, Flu A+B and RSV ([facility])     Status: None   Collection Time: 07/01/21  4:30 PM  Result Value Ref Range Status   SARS-CoV-2, NAA Not Detected Not Detected Final   Influenza A, NAA Not Detected Not Detected Final   Influenza B, NAA Not Detected Not Detected Final   RSV, NAA Not Detected Not Detected Final   Test Information: Comment  Final    Comment: This nucleic acid amplification test was developed and its performance characteristics determined by [employer]. Nucleic acid amplification tests include RT-PCR and TMA. This test has not been FDA cleared or approved. This test has been authorized by FDA under an Emergency Use Authorization (EUA). This test is only authorized for the duration of time the declaration that circumstances exist justifying the authorization of the emergency use of in vitro diagnostic tests for detection of SARS-CoV-2 virus and/or diagnosis of COVID-19 infection under section 564(b)(1) of the Act, 21 U.S.C. 812XNT-7(G) (1), unless the authorization is terminated or revoked sooner. When diagnostic testing is negative, the possibility of a false negative result should be considered in the context of a patient's recent exposures and the presence of clinical signs and symptoms consistent with COVID-19. An individual without symptoms of COVID-19 and who is not shedding SARS-CoV-2 virus wo uld expect to have a  negative (not detected) result in this assay.   Resp Panel by RT-PCR (Flu A&B, Covid) Nasopharyngeal Swab     Status: Abnormal   Collection Time: 07/04/21  7:30 PM   Specimen: Nasopharyngeal Swab; Nasopharyngeal(NP) swabs in vial transport medium  Result Value Ref Range Status   SARS Coronavirus 2 by RT PCR POSITIVE (A) NEGATIVE Final    Comment: CRITICAL RESULT CALLED TO, READ BACK BY AND VERIFIED WITH:  Clarene Reamer RN 07/04/21 @ 2240 VS (NOTE) SARS-CoV-2 target nucleic acids are DETECTED.  The SARS-CoV-2 RNA is generally detectable in upper respiratory specimens during the acute phase of infection. Positive results are indicative of the presence of the identified virus, but do not rule out bacterial infection or co-infection with other pathogens not detected by the test. Clinical correlation with patient history and other diagnostic information is necessary to determine patient infection status. The expected result is Negative.  Fact Sheet for  Patients: EntrepreneurPulse.com.au  Fact Sheet for Healthcare Providers: IncredibleEmployment.be  This test is not yet approved or cleared by the [place] FDA and  has been authorized for detection and/or diagnosis of SARS-CoV-2 by FDA under an Emergency Use Authorization (EUA).  This EUA will remain in effect (meaning th is test can be used) for the duration of  the COVID-19 declaration under Section 564(b)(1) of the Act, 21 U.S.C. section 360bbb-3(b)(1), unless the authorization is terminated or revoked sooner.     Influenza A by PCR NEGATIVE NEGATIVE Final   Influenza B by PCR NEGATIVE NEGATIVE Final    Comment: (NOTE) The Xpert Xpress SARS-CoV-2/FLU/RSV plus assay is intended as an aid in the diagnosis of influenza from Nasopharyngeal swab specimens and should not be used as a sole basis for treatment. Nasal washings and aspirates are unacceptable for Xpert Xpress SARS-CoV-2/FLU/RSV testing.  Fact Sheet for Patients: EntrepreneurPulse.com.au  Fact Sheet for Healthcare Providers: IncredibleEmployment.be  This test is not yet approved or cleared by the [place] FDA and has been authorized for detection and/or diagnosis of SARS-CoV-2 by FDA under an Emergency Use Authorization (EUA). This EUA will remain in effect (meaning this test can be used) for the duration of the COVID-19 declaration under Section 564(b)(1) of the Act, 21 U.S.C. section 360bbb-3(b)(1), unless the authorization is terminated or revoked.  Performed at [facility], [place] [address], [place], [place] [address]       Studies: No results found.  Scheduled Meds:  acetaminophen  1,000 mg Oral Q6H   amphetamine-dextroamphetamine  15 mg Oral Q breakfast   vitamin C  500 mg Oral Daily   doxepin  75 mg Oral QHS   enoxaparin (LOVENOX) injection  40 mg Subcutaneous Q24H   feeding supplement  237 mL Oral BID  BM   FLUoxetine  60 mg Oral q morning   Ipratropium-Albuterol  1 puff Inhalation TID   linaclotide  290 mcg Oral QAC breakfast   lip balm  1 application Topical BID   multivitamin with minerals  1 tablet Oral q morning   niacin  100 mg Oral QHS   pantoprazole  40 mg Oral BID   phosphorus  500 mg Oral TID   polyethylene glycol  17 g Oral BID   potassium chloride  40 mEq Oral BID   sodium chloride flush  3 mL Intravenous Q12H   sucralfate  1 g Oral BID   zinc sulfate  220 mg Oral Daily    Continuous Infusions:  sodium chloride     methocarbamol (  ROBAXIN) IV       LOS: 4 days     Alma Friendly, MD [facility]  If 7PM-7AM, please contact night-coverage [email or web address] 07/10/2021, 1:56 PM

## 2021-07-10 NOTE — Progress Notes (Addendum)
Patient will discharge home with husband this afternoon. Belongings were returned. Medication education will be provided. Patient let nurse know that pharmacy on file is closed today. Reached out to MD to have [facility] medication [facility]. No response. Will continue to try to have [facility] to [facility].

## 2021-07-10 NOTE — Progress Notes (Signed)
3 Days Post-Op   Subjective/Chief Complaint: Feels much better. Having bm's. Wants to go home   Objective: Vital signs in last 24 hours: Temp:  [97.8 F (36.6 C)-99.1 F (37.3 C)] 98.6 F (37 C) (11/13 0642) Pulse Rate:  [83-89] 83 (11/13 0642) Resp:  [16-18] 16 (11/13 0642) BP: (99-119)/(73-81) 99/76 (11/13 0642) SpO2:  [94 %-100 %] 94 % (11/13 0642) Weight:  [65.4 kg] 65.4 kg (11/13 0645) Last BM Date: 07/09/21  Intake/Output from previous day: 11/12 0701 - 11/13 0700 In: 201.7 [IV Piggyback:201.7] Out: -  Intake/Output this shift: No intake/output data recorded.  General appearance: alert and cooperative Resp: clear to auscultation bilaterally Cardio: regular rate and rhythm GI: soft, mild to moderate tenderness. Incision looks good. Drain output serosanguinoous  Lab Results:  Recent Labs    07/09/21 0606 07/10/21 0523  WBC 7.1 8.0  HGB 8.8* 8.9*  HCT 28.2* 28.0*  PLT 335 354   BMET Recent Labs    07/09/21 0606 07/10/21 0523  NA 136 139  K 3.1* 3.3*  CL 103 107  CO2 28 29  GLUCOSE 83 84  BUN 5* 8  CREATININE 0.58 0.61  CALCIUM 7.9* 7.8*   PT/INR No results for input(s): LABPROT, INR in the last 72 hours. ABG No results for input(s): PHART, HCO3 in the last 72 hours.  Invalid input(s): PCO2, PO2  Studies/Results: No results found.  Anti-infectives: Anti-infectives (From admission, onward)    Start     Dose/Rate Route Frequency Ordered Stop   07/08/21 0200  cefoTEtan (CEFOTAN) 2 g in sodium chloride 0.9 % 100 mL IVPB        2 g 200 mL/hr over 30 Minutes Intravenous Every 12 hours 07/07/21 1927 07/08/21 0131   07/07/21 1000  remdesivir 100 mg in sodium chloride 0.9 % 100 mL IVPB       See Hyperspace for full Linked Orders Report.   100 mg 200 mL/hr over 30 Minutes Intravenous Daily 07/06/21 1226 07/11/21 0959   07/07/21 0600  gentamicin (GARAMYCIN) 300 mg in dextrose 5 % 100 mL IVPB  Status:  Discontinued       See Hyperspace for full  Linked Orders Report.   5 mg/kg  59 kg 107.5 mL/hr over 60 Minutes Intravenous 60 min pre-op 07/06/21 1114 07/06/21 1211   07/07/21 0600  clindamycin (CLEOCIN) IVPB 900 mg  Status:  Discontinued       See Hyperspace for full Linked Orders Report.   900 mg 100 mL/hr over 30 Minutes Intravenous 60 min pre-op 07/06/21 1211 07/06/21 1733   07/07/21 0600  gentamicin (GARAMYCIN) 300 mg in dextrose 5 % 100 mL IVPB  Status:  Discontinued       See Hyperspace for full Linked Orders Report.   5 mg/kg  59 kg 107.5 mL/hr over 60 Minutes Intravenous 60 min pre-op 07/06/21 1211 07/06/21 1733   07/07/21 0600  cefoTEtan (CEFOTAN) 2 g in sodium chloride 0.9 % 100 mL IVPB        2 g 200 mL/hr over 30 Minutes Intravenous On call to O.R. 07/06/21 1730 07/07/21 1400   07/06/21 2200  neomycin (MYCIFRADIN) tablet 1,000 mg  Status:  Discontinued       See Hyperspace for full Linked Orders Report.   1,000 mg Oral 3 times per day 07/06/21 1730 07/06/21 1733   07/06/21 2200  metroNIDAZOLE (FLAGYL) tablet 1,000 mg  Status:  Discontinued       See Hyperspace for full Linked Orders Report.  1,000 mg Oral 3 times per day 07/06/21 1730 07/06/21 1733   07/06/21 1400  neomycin (MYCIFRADIN) tablet 1,000 mg       See Hyperspace for full Linked Orders Report.   1,000 mg Oral 3 times per day 07/06/21 1114 07/07/21 1359   07/06/21 1400  metroNIDAZOLE (FLAGYL) tablet 1,000 mg       See Hyperspace for full Linked Orders Report.   1,000 mg Oral 3 times per day 07/06/21 1114 07/06/21 2155   07/06/21 1400  remdesivir 200 mg in sodium chloride 0.9% 250 mL IVPB       See Hyperspace for full Linked Orders Report.   200 mg 580 mL/hr over 30 Minutes Intravenous Once 07/06/21 1226 07/06/21 1705   07/06/21 1113  clindamycin (CLEOCIN) IVPB 900 mg  Status:  Discontinued       See Hyperspace for full Linked Orders Report.   900 mg 100 mL/hr over 30 Minutes Intravenous 60 min pre-op 07/06/21 1114 07/06/21 1211        Assessment/Plan: s/p Procedure(s): XI ROBOT ASSISTED RECTOPEXY (N/A) XI ROBOTIC ASSISTED LOW ANTERIOR RECTOSIGMOID RESECTION WITH RIGID PROCTOSCOPY, TAP BLOCK (N/A) Advance diet Discharge  LOS: 4 days    Autumn Messing III 07/10/2021

## 2021-07-11 LAB — SURGICAL PATHOLOGY

## 2021-07-12 ENCOUNTER — Inpatient Hospital Stay: Payer: Commercial Managed Care - PPO | Admitting: Physician Assistant

## 2021-07-12 ENCOUNTER — Inpatient Hospital Stay: Payer: Commercial Managed Care - PPO

## 2021-07-15 ENCOUNTER — Ambulatory Visit (INDEPENDENT_AMBULATORY_CARE_PROVIDER_SITE_OTHER): Payer: Commercial Managed Care - PPO

## 2021-07-15 ENCOUNTER — Other Ambulatory Visit: Payer: Self-pay

## 2021-07-15 VITALS — BP 116/72 | HR 91 | Temp 97.6°F | Resp 18 | Ht 65.0 in | Wt 132.0 lb

## 2021-07-15 DIAGNOSIS — D508 Other iron deficiency anemias: Secondary | ICD-10-CM

## 2021-07-15 DIAGNOSIS — D509 Iron deficiency anemia, unspecified: Secondary | ICD-10-CM

## 2021-07-15 MED ORDER — FAMOTIDINE IN NACL 20-0.9 MG/50ML-% IV SOLN: 20.0000 mg | Freq: Once | INTRAVENOUS | Status: DC | PRN

## 2021-07-15 MED ORDER — ACETAMINOPHEN 325 MG PO TABS
650.0000 mg | ORAL_TABLET | Freq: Once | ORAL | Status: AC
  Administered 2021-07-15: 650 mg via ORAL
  Filled 2021-07-15: qty 2

## 2021-07-15 MED ORDER — DIPHENHYDRAMINE HCL 25 MG PO CAPS: 50.0000 mg | ORAL_CAPSULE | Freq: Once | ORAL | Status: DC

## 2021-07-15 MED ORDER — ALBUTEROL SULFATE HFA 108 (90 BASE) MCG/ACT IN AERS: 2.0000 | INHALATION_SPRAY | Freq: Once | RESPIRATORY_TRACT | Status: DC | PRN

## 2021-07-15 MED ORDER — EPINEPHRINE 0.3 MG/0.3ML IJ SOAJ: 0.3000 mg | Freq: Once | INTRAMUSCULAR | Status: DC | PRN

## 2021-07-15 MED ORDER — METHYLPREDNISOLONE SODIUM SUCC 125 MG IJ SOLR: 125.0000 mg | Freq: Once | INTRAMUSCULAR | Status: DC | PRN

## 2021-07-15 MED ORDER — SODIUM CHLORIDE 0.9 % IV SOLN: Freq: Once | INTRAVENOUS | Status: DC | PRN

## 2021-07-15 MED ORDER — DIPHENHYDRAMINE HCL 50 MG/ML IJ SOLN: 50.0000 mg | Freq: Once | INTRAMUSCULAR | Status: DC | PRN

## 2021-07-15 MED ORDER — SODIUM CHLORIDE 0.9 % IV SOLN
200.0000 mg | Freq: Once | INTRAVENOUS | Status: AC
  Administered 2021-07-15: 200 mg via INTRAVENOUS
  Filled 2021-07-15: qty 10

## 2021-07-15 NOTE — Discharge Summary (Signed)
Patient ID: Emily Park [id number] 12/01/1970 50 y.o.  Admit date: 07/04/2021 Discharge date: 07/10/2021  Admitting Diagnosis: Recurrent rectal prolapse  Discharge Diagnosis Recurrent rectal prolapse COVID+   ADHD Migraines Anxiety/depression Hx arrhythmia s/p ablation Gastroparesis GERD Hiatal hernia PUD  Consultants GI TRH  HPI: The patient is a 50 year old white female who has significant GI symptoms of constipation and straining. She is followed by [facility]. She has had 2 episodes of rectal prolapse in the last 2 months. The most recent episode started yesterday with some straining. She denies any nausea or vomiting although she has been recently diagnosed with gastric ulcers and gastroparesis. No fever  Procedures Dr. Michaell Cowing - 11/10 ROBOTIC LOW ANTERIOR RECTOSIGMOID RESECTION ROBOTIC RECTOPEXY  RIGID PROCTOSCOPY TRANSVERSUS ABDOMINIS PLANE (TAP) BLOCK - BILATERAL INTRAOPERATIVE ASSESSMENT OF TISSUE VASCULAR PERFUSION USING ICG (indocyanine green) IMMUNOFLUORESCENCE  Hospital Course:  Patient presented as above for recurrent rectal prolapse. This was able to be reduced in the ED. She was admitted for observation. Incidentally found to be COVID+. TRH was asked to see to assist in chronic medical conditions and COVID management. She was started on remdesivir. GI was consulted. She had undergone recent EGD and colonoscopy prior to admission and was felt to not require additional workup/scoping. GI placed patient on  Linzess, MiraLAX and Benefiber to help aid in chronic constipation. During admission patient had recurrence of prolapse that was able to be reduced at bedside. Decision was made to proceed with surgical repair during admission. On 11/10 patient underwent robotic LAR w/ rectopexy by Dr. Michaell Cowing. Patient tolerated the procedure well and was transferred back to the floor. Diet was advanced and tolerated. Patient worked with therapies who recommended no follow up.  On 11/13 the patient was felt stable for discharge home.   I was not directly involved in this patient's care and did not see the patient during their hospital stay, therefore the information in this discharge summary was taken entirely from the chart.  Patient was scheduled a follow up appointment with Dr. Michaell Cowing as noted below. Patient has drain check appointment in the office on 11/18.  Physical Exam: Please see MD's note from earlier today  Allergies as of 07/10/2021       Reactions   Amoxicillin Rash   Bactrim [sulfamethoxazole-trimethoprim] Rash   Rash noted to chest and back   Zithromax [azithromycin] Rash        Medication List     STOP taking these medications    acetaminophen 500 MG tablet Commonly known as: TYLENOL   ibuprofen 200 MG tablet Commonly known as: ADVIL   linaclotide 290 MCG Caps capsule Commonly known as: Linzess   metoCLOPramide 5 MG tablet Commonly known as: REGLAN   ondansetron 8 MG disintegrating tablet Commonly known as: ZOFRAN-ODT       TAKE these medications    amphetamine-dextroamphetamine 30 MG tablet Commonly known as: ADDERALL Take 0.5 tablets by mouth every morning.   CALCIUM PO Take 1 tablet by mouth every morning.   clonazePAM 1 MG tablet Commonly known as: KLONOPIN Take 1 mg by mouth at bedtime.   doxepin 75 MG capsule Commonly known as: SINEQUAN Take 75 mg by mouth at bedtime.   FLUoxetine 20 MG capsule Commonly known as: PROZAC Take 60 mg by mouth every morning.   K2 PO Take 1 tablet by mouth every morning.   multivitamin with minerals Tabs tablet Take 1 tablet by mouth every morning.   NIACIN PO Take  1 tablet by mouth every morning.   oxyCODONE 5 MG immediate release tablet Commonly known as: Oxy IR/ROXICODONE Take 1-2 tablets (5-10 mg total) by mouth every 6 (six) hours as needed for moderate pain or severe pain (5mg  moderate, 10mg  severe).   pantoprazole 40 MG tablet Commonly known as:  PROTONIX Take 40 mg by mouth 2 (two) times daily.   polyethylene glycol 17 g packet Commonly known as: MIRALAX / GLYCOLAX Take 17 g by mouth 2 (two) times daily.   potassium chloride SA 20 MEQ tablet Commonly known as: KLOR-CON Take 2 tablets (40 mEq total) by mouth daily. What changed: when to take this   promethazine 25 MG tablet Commonly known as: PHENERGAN Take 1 tablet (25 mg total) by mouth every 6 (six) hours as needed for nausea or vomiting.   sucralfate 1 g tablet Commonly known as: CARAFATE Take 1 g by mouth 2 (two) times daily.   topiramate 50 MG tablet Commonly known as: TOPAMAX Take 150 mg by mouth every morning.   TURMERIC PO Take 1 capsule by mouth every morning.   VITAMIN C PO Take 1 tablet by mouth every morning.   VITAMIN D3 PO Take 1 tablet by mouth every morning.   zinc sulfate 220 (50 Zn) MG capsule Take 1 capsule (220 mg total) by mouth daily.          Follow-up Information     Karie Soda, MD. Call on 08/02/2021.   Specialties: General Surgery, Colon and Rectal Surgery Why: 12/6 at 11:30am. Please arrive 30 minutes prior to your appointment to check in and fill out paperwork. Bring photo ID and inurance information. also needs to be seen this week for removal of drain Contact information: [address] [place] [address] [phone]         Melida Quitter, MD. Schedule an appointment as soon as possible for a visit in 1 week(s).   Specialty: Internal Medicine Contact information: [address] [place] [address] [phone]                 Signed: Leary Roca, [facility] Surgery 07/15/2021, 2:19 PM Please see Amion for pager number during day hours 7:00am-4:30pm

## 2021-07-15 NOTE — Progress Notes (Signed)
Diagnosis: Iron Deficiency Anemia  Provider:  Marshell Garfinkel, MD  Procedure: Infusion  IV Type: Peripheral, IV Location: L Forearm  Venofer (Iron Sucrose), Dose: 200 mg  Infusion Start Time: 4847  Infusion Stop Time: 1120   Post Infusion IV Care: Peripheral IV Discontinued  Discharge: Condition: Good, Destination: Home . AVS provided to patient.   Performed by:  Glenola Wheat, Sherlon Handing, LPN

## 2021-07-22 ENCOUNTER — Emergency Department (HOSPITAL_COMMUNITY)
Admission: EM | Admit: 2021-07-22 | Discharge: 2021-07-22 | Discharge status: Against Medical Advice | Payer: Commercial Managed Care - PPO | Attending: Emergency Medicine | Admitting: Emergency Medicine

## 2021-07-22 ENCOUNTER — Other Ambulatory Visit: Payer: Self-pay

## 2021-07-22 ENCOUNTER — Encounter (HOSPITAL_COMMUNITY): Payer: Self-pay

## 2021-07-22 DIAGNOSIS — R109 Unspecified abdominal pain: Secondary | ICD-10-CM | POA: Diagnosis present

## 2021-07-22 DIAGNOSIS — Z5321 Procedure and treatment not carried out due to patient leaving prior to being seen by health care provider: Secondary | ICD-10-CM | POA: Diagnosis not present

## 2021-07-22 LAB — BASIC METABOLIC PANEL
Anion gap: 9 (ref 5–15)
BUN: 17 mg/dL (ref 6–20)
CO2: 22 mmol/L (ref 22–32)
Calcium: 8.8 mg/dL — ABNORMAL LOW (ref 8.9–10.3)
Chloride: 106 mmol/L (ref 98–111)
Creatinine, Ser: 0.72 mg/dL (ref 0.44–1.00)
GFR, Estimated: >60 mL/min (ref >60)
Glucose, Bld: 104 mg/dL — ABNORMAL HIGH (ref 70–99)
Potassium: 3 mmol/L — ABNORMAL LOW (ref 3.5–5.1)
Sodium: 137 mmol/L (ref 135–145)

## 2021-07-22 LAB — CBC WITH DIFFERENTIAL/PLATELET
Abs Immature Granulocytes: 0.05 10*3/uL (ref 0.00–0.07)
Basophils Absolute: 0.1 10*3/uL (ref 0.0–0.1)
Basophils Relative: 1 %
Eosinophils Absolute: 0.1 10*3/uL (ref 0.0–0.5)
Eosinophils Relative: 1 %
HCT: 31.6 % — ABNORMAL LOW (ref 36.0–46.0)
Hemoglobin: 10.3 g/dL — ABNORMAL LOW (ref 12.0–15.0)
Immature Granulocytes: 1 %
Lymphocytes Relative: 25 %
Lymphs Abs: 2.4 10*3/uL (ref 0.7–4.0)
MCH: 29.3 pg (ref 26.0–34.0)
MCHC: 32.6 g/dL (ref 30.0–36.0)
MCV: 89.8 fL (ref 80.0–100.0)
Monocytes Absolute: 0.6 10*3/uL (ref 0.1–1.0)
Monocytes Relative: 6 %
Neutro Abs: 6.4 10*3/uL (ref 1.7–7.7)
Neutrophils Relative %: 66 %
Platelets: 628 10*3/uL — ABNORMAL HIGH (ref 150–400)
RBC: 3.52 MIL/uL — ABNORMAL LOW (ref 3.87–5.11)
RDW: 20.7 % — ABNORMAL HIGH (ref 11.5–15.5)
WBC: 9.6 10*3/uL (ref 4.0–10.5)
nRBC: 0 % (ref 0.0–0.2)

## 2021-07-22 NOTE — ED Provider Notes (Signed)
Emergency Medicine Provider Triage Evaluation Note  Emily Park , a 50 y.o. female  was evaluated in triage.  Pt complains of pain at incision site.  Had rectal prolapse surgery recently with [facility].  Patient states she has had pain to her right suprapubic surgical sites.  Initially was seen by [facility] [place] [facility], [place], PA.  Started her on antibiotics.  She contacted them again and sent pictures.  He said to continue on antibiotics.  Patient states she has increased pain when lying, rolling at night.  She feels like the area is intermittently warm.  Intermittently has some clear and yellow drainage from her right site. No rectal pain, swelling.  Review of Systems  Positive: Postop problem Negative:   Physical Exam  BP (!) 149/96 (BP Location: Left Arm)   Pulse (!) 106   Temp 98 F (36.7 C) (Oral)   Resp 18   Ht 5\' 5"  (1.651 m)   Wt 56.7 kg   SpO2 100%   BMI 20.80 kg/m  Gen:   Awake, no distress   Resp:  Normal effort  MSK:   Moves extremities without difficulty  Skin:  Surgical sites with some mild erythema.  No active draining in triage room. Other:    Medical Decision Making  Medically screening exam initiated at 12:34 PM.  Appropriate orders placed.  Panda Crossin Witucki was informed that the remainder of the evaluation will be completed by another provider, this initial triage assessment does not replace that evaluation, and the importance of remaining in the ED until their evaluation is complete.  Postop problem   Elika Godar A, PA-C 07/22/21 1236    Wyvonnia Dusky, MD 07/22/21 [phone]

## 2021-07-22 NOTE — ED Notes (Signed)
Pt very upset about having to wait for a room. Pt yelling in the lobby. Pt advised there is no room and in order to receive any further  treatment and lab results pt will need to continue waiting. Pt upset we will not tell her the results of her lab work. Pt sts " you guys suck". Pt grabbed her personal belongings and walked out the door sts 'she is calling her lawyer'.

## 2021-07-22 NOTE — ED Triage Notes (Signed)
Patient states she had rectal prolapse surgery recently. Patient c/o increased pain to surgical sites on her abdomen. Patient does have an open area to one area. Patient states she has increased pain when lying.

## 2021-07-28 ENCOUNTER — Ambulatory Visit: Payer: Commercial Managed Care - PPO

## 2021-07-29 ENCOUNTER — Other Ambulatory Visit: Payer: Self-pay

## 2021-07-29 ENCOUNTER — Ambulatory Visit (INDEPENDENT_AMBULATORY_CARE_PROVIDER_SITE_OTHER): Payer: Commercial Managed Care - PPO

## 2021-07-29 VITALS — BP 118/77 | HR 88 | Temp 98.0°F | Resp 18 | Ht 64.75 in | Wt 126.4 lb

## 2021-07-29 DIAGNOSIS — D508 Other iron deficiency anemias: Secondary | ICD-10-CM | POA: Diagnosis not present

## 2021-07-29 MED ORDER — ALBUTEROL SULFATE HFA 108 (90 BASE) MCG/ACT IN AERS: 2.0000 | INHALATION_SPRAY | Freq: Once | RESPIRATORY_TRACT | Status: DC | PRN

## 2021-07-29 MED ORDER — SODIUM CHLORIDE 0.9 % IV SOLN: Freq: Once | INTRAVENOUS | Status: DC | PRN

## 2021-07-29 MED ORDER — FAMOTIDINE IN NACL 20-0.9 MG/50ML-% IV SOLN: 20.0000 mg | Freq: Once | INTRAVENOUS | Status: DC | PRN

## 2021-07-29 MED ORDER — SODIUM CHLORIDE 0.9 % IV SOLN
200.0000 mg | Freq: Once | INTRAVENOUS | Status: AC
  Administered 2021-07-29: 200 mg via INTRAVENOUS
  Filled 2021-07-29: qty 10

## 2021-07-29 MED ORDER — DIPHENHYDRAMINE HCL 50 MG/ML IJ SOLN: 50.0000 mg | Freq: Once | INTRAMUSCULAR | Status: DC | PRN

## 2021-07-29 MED ORDER — EPINEPHRINE 0.3 MG/0.3ML IJ SOAJ: 0.3000 mg | Freq: Once | INTRAMUSCULAR | Status: DC | PRN

## 2021-07-29 MED ORDER — ACETAMINOPHEN 325 MG PO TABS
650.0000 mg | ORAL_TABLET | Freq: Once | ORAL | Status: AC
  Administered 2021-07-29: 650 mg via ORAL
  Filled 2021-07-29: qty 2

## 2021-07-29 MED ORDER — DIPHENHYDRAMINE HCL 25 MG PO CAPS: 50.0000 mg | ORAL_CAPSULE | Freq: Once | ORAL | Status: DC

## 2021-07-29 MED ORDER — METHYLPREDNISOLONE SODIUM SUCC 125 MG IJ SOLR: 125.0000 mg | Freq: Once | INTRAMUSCULAR | Status: DC | PRN

## 2021-07-29 NOTE — Progress Notes (Signed)
Diagnosis: Other iron deficiency  Provider:  Marshell Garfinkel, MD  Procedure: Infusion  IV Type: Peripheral, IV Location: L Antecubital  Venofer (Iron Sucrose), 200 mg  Infusion Start Time: 5284  Infusion Stop Time: 1406  Post Infusion IV Care: Peripheral IV Discontinued  Discharge: Condition: Good, Destination: Home . AVS provided to patient.   Performed by:  Charlie Pitter, RN

## 2021-08-01 ENCOUNTER — Other Ambulatory Visit: Payer: Self-pay | Admitting: Physician Assistant

## 2021-08-01 DIAGNOSIS — D508 Other iron deficiency anemias: Secondary | ICD-10-CM

## 2021-08-02 ENCOUNTER — Inpatient Hospital Stay: Payer: Commercial Managed Care - PPO | Attending: Physician Assistant

## 2021-08-02 ENCOUNTER — Inpatient Hospital Stay: Payer: Commercial Managed Care - PPO | Admitting: Physician Assistant

## 2021-08-02 DIAGNOSIS — Z79899 Other long term (current) drug therapy: Secondary | ICD-10-CM | POA: Insufficient documentation

## 2021-08-02 DIAGNOSIS — Z8349 Family history of other endocrine, nutritional and metabolic diseases: Secondary | ICD-10-CM | POA: Insufficient documentation

## 2021-08-02 DIAGNOSIS — Z808 Family history of malignant neoplasm of other organs or systems: Secondary | ICD-10-CM | POA: Insufficient documentation

## 2021-08-02 DIAGNOSIS — R42 Dizziness and giddiness: Secondary | ICD-10-CM | POA: Insufficient documentation

## 2021-08-02 DIAGNOSIS — Z818 Family history of other mental and behavioral disorders: Secondary | ICD-10-CM | POA: Insufficient documentation

## 2021-08-02 DIAGNOSIS — Z8249 Family history of ischemic heart disease and other diseases of the circulatory system: Secondary | ICD-10-CM | POA: Insufficient documentation

## 2021-08-02 DIAGNOSIS — K259 Gastric ulcer, unspecified as acute or chronic, without hemorrhage or perforation: Secondary | ICD-10-CM | POA: Insufficient documentation

## 2021-08-02 DIAGNOSIS — Z881 Allergy status to other antibiotic agents status: Secondary | ICD-10-CM | POA: Insufficient documentation

## 2021-08-02 DIAGNOSIS — D509 Iron deficiency anemia, unspecified: Secondary | ICD-10-CM | POA: Insufficient documentation

## 2021-08-02 DIAGNOSIS — F419 Anxiety disorder, unspecified: Secondary | ICD-10-CM | POA: Insufficient documentation

## 2021-08-02 DIAGNOSIS — K623 Rectal prolapse: Secondary | ICD-10-CM | POA: Insufficient documentation

## 2021-08-02 DIAGNOSIS — Z880 Allergy status to penicillin: Secondary | ICD-10-CM | POA: Insufficient documentation

## 2021-08-02 DIAGNOSIS — Z833 Family history of diabetes mellitus: Secondary | ICD-10-CM | POA: Insufficient documentation

## 2021-08-02 DIAGNOSIS — E876 Hypokalemia: Secondary | ICD-10-CM | POA: Insufficient documentation

## 2021-08-02 DIAGNOSIS — K3184 Gastroparesis: Secondary | ICD-10-CM | POA: Insufficient documentation

## 2021-08-02 DIAGNOSIS — Z803 Family history of malignant neoplasm of breast: Secondary | ICD-10-CM | POA: Insufficient documentation

## 2021-08-09 ENCOUNTER — Inpatient Hospital Stay: Payer: Commercial Managed Care - PPO

## 2021-08-09 ENCOUNTER — Other Ambulatory Visit: Payer: Self-pay

## 2021-08-09 DIAGNOSIS — Z880 Allergy status to penicillin: Secondary | ICD-10-CM | POA: Diagnosis not present

## 2021-08-09 DIAGNOSIS — E876 Hypokalemia: Secondary | ICD-10-CM | POA: Diagnosis not present

## 2021-08-09 DIAGNOSIS — Z818 Family history of other mental and behavioral disorders: Secondary | ICD-10-CM | POA: Diagnosis not present

## 2021-08-09 DIAGNOSIS — F419 Anxiety disorder, unspecified: Secondary | ICD-10-CM | POA: Diagnosis not present

## 2021-08-09 DIAGNOSIS — Z79899 Other long term (current) drug therapy: Secondary | ICD-10-CM | POA: Diagnosis not present

## 2021-08-09 DIAGNOSIS — K3184 Gastroparesis: Secondary | ICD-10-CM | POA: Diagnosis not present

## 2021-08-09 DIAGNOSIS — Z8249 Family history of ischemic heart disease and other diseases of the circulatory system: Secondary | ICD-10-CM | POA: Diagnosis not present

## 2021-08-09 DIAGNOSIS — Z881 Allergy status to other antibiotic agents status: Secondary | ICD-10-CM | POA: Diagnosis not present

## 2021-08-09 DIAGNOSIS — Z808 Family history of malignant neoplasm of other organs or systems: Secondary | ICD-10-CM | POA: Diagnosis not present

## 2021-08-09 DIAGNOSIS — K623 Rectal prolapse: Secondary | ICD-10-CM | POA: Diagnosis not present

## 2021-08-09 DIAGNOSIS — Z8349 Family history of other endocrine, nutritional and metabolic diseases: Secondary | ICD-10-CM | POA: Diagnosis not present

## 2021-08-09 DIAGNOSIS — D508 Other iron deficiency anemias: Secondary | ICD-10-CM

## 2021-08-09 DIAGNOSIS — K259 Gastric ulcer, unspecified as acute or chronic, without hemorrhage or perforation: Secondary | ICD-10-CM | POA: Diagnosis not present

## 2021-08-09 DIAGNOSIS — R42 Dizziness and giddiness: Secondary | ICD-10-CM | POA: Diagnosis not present

## 2021-08-09 DIAGNOSIS — Z803 Family history of malignant neoplasm of breast: Secondary | ICD-10-CM | POA: Diagnosis not present

## 2021-08-09 DIAGNOSIS — D509 Iron deficiency anemia, unspecified: Secondary | ICD-10-CM | POA: Diagnosis not present

## 2021-08-09 DIAGNOSIS — Z833 Family history of diabetes mellitus: Secondary | ICD-10-CM | POA: Diagnosis not present

## 2021-08-09 LAB — CBC WITH DIFFERENTIAL (CANCER CENTER ONLY)
Abs Immature Granulocytes: 0.02 10*3/uL (ref 0.00–0.07)
Basophils Absolute: 0.1 10*3/uL (ref 0.0–0.1)
Basophils Relative: 1 %
Eosinophils Absolute: 0.5 10*3/uL (ref 0.0–0.5)
Eosinophils Relative: 5 %
HCT: 36.3 % (ref 36.0–46.0)
Hemoglobin: 11.8 g/dL — ABNORMAL LOW (ref 12.0–15.0)
Immature Granulocytes: 0 %
Lymphocytes Relative: 26 %
Lymphs Abs: 2.3 10*3/uL (ref 0.7–4.0)
MCH: 29.7 pg (ref 26.0–34.0)
MCHC: 32.5 g/dL (ref 30.0–36.0)
MCV: 91.4 fL (ref 80.0–100.0)
Monocytes Absolute: 0.7 10*3/uL (ref 0.1–1.0)
Monocytes Relative: 8 %
Neutro Abs: 5.5 10*3/uL (ref 1.7–7.7)
Neutrophils Relative %: 60 %
Platelet Count: 299 10*3/uL (ref 150–400)
RBC: 3.97 MIL/uL (ref 3.87–5.11)
RDW: 17.1 % — ABNORMAL HIGH (ref 11.5–15.5)
WBC Count: 9 10*3/uL (ref 4.0–10.5)
nRBC: 0 % (ref 0.0–0.2)

## 2021-08-09 LAB — RETIC PANEL
Immature Retic Fract: 8.5 % (ref 2.3–15.9)
RBC.: 3.87 MIL/uL (ref 3.87–5.11)
Retic Count, Absolute: 57.3 10*3/uL (ref 19.0–186.0)
Retic Ct Pct: 1.5 % (ref 0.4–3.1)
Reticulocyte Hemoglobin: 34.8 pg (ref >27.9)

## 2021-08-09 LAB — IRON AND TIBC
Iron: 171 ug/dL — ABNORMAL HIGH (ref 41–142)
Saturation Ratios: 83 % — ABNORMAL HIGH (ref 21–57)
TIBC: 206 ug/dL — ABNORMAL LOW (ref 236–444)
UIBC: 35 ug/dL — ABNORMAL LOW (ref 120–384)

## 2021-08-09 LAB — FERRITIN: Ferritin: 171 ng/mL (ref 11–307)

## 2021-08-12 ENCOUNTER — Telehealth: Payer: Commercial Managed Care - PPO | Admitting: Physician Assistant

## 2021-08-12 ENCOUNTER — Inpatient Hospital Stay: Payer: Commercial Managed Care - PPO | Admitting: Physician Assistant

## 2021-08-16 ENCOUNTER — Other Ambulatory Visit: Payer: Self-pay

## 2021-08-16 ENCOUNTER — Inpatient Hospital Stay (HOSPITAL_BASED_OUTPATIENT_CLINIC_OR_DEPARTMENT_OTHER): Payer: Commercial Managed Care - PPO | Admitting: Physician Assistant

## 2021-08-16 VITALS — BP 118/84 | HR 85 | Temp 97.5°F | Resp 18 | Wt 127.1 lb

## 2021-08-16 DIAGNOSIS — D508 Other iron deficiency anemias: Secondary | ICD-10-CM

## 2021-08-16 DIAGNOSIS — D509 Iron deficiency anemia, unspecified: Secondary | ICD-10-CM | POA: Diagnosis not present

## 2021-08-16 MED ORDER — FERROUS SULFATE 325 (65 FE) MG PO TBEC: 325.0000 mg | DELAYED_RELEASE_TABLET | Freq: Every day | ORAL | 3 refills | Status: DC

## 2021-08-16 NOTE — Progress Notes (Signed)
[place] Telephone:([phone]) [phone]   Fax:(336) [phone]  PROGRESS NOTE  Patient Care Team: [name], MD as PCP - General (Internal Medicine) [name], MD as Consulting Physician (Colon and Rectal Surgery) Ronnette Juniper, MD as Consulting Physician (Gastroenterology) Otis Brace, MD as Consulting Physician (Gastroenterology)  Hematological/Oncological History 1) 04/04/2021: Labs from [facility]: -Ferritin 15.6 (L), TIBC 424, Serum iron <10 (L),iron saturation was too low to calculate, transferrin 303. WBC 17.6 (H), Hgb 8.7 (L), MCV 76.9 (L), Plt 548 (H), ANC 13.6 (H), Vitamin B12 788.   2) 04/13/2021-04/14/2021: Admitted for nausea, vomiting or diarrhea. Labs show ferritin <5 (L), iron 20 (L), TIBC 387, iron saturation 5% (L), WBC 6.9, Hgb 8.2 (L), MCV 86.0, Plt 408 (L).   3) 05/13/2021: Establish care with [facility]  4) 06/03/2021-07/29/2021: Received IV venofer x 5 doses.   CHIEF COMPLAINTS/PURPOSE OF CONSULTATION:  "Iron deficiency anemia "  HISTORY OF PRESENTING ILLNESS:  Emily Park 50 y.o. female returns for a follow up for iron deficiency anemia. Since the last visit, patient received IV venofer x 5 doses.   On exam today. Emily Park reports her energy levels have improved since receiving IV iron.  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  She continues to complete her daily activities on her own.  She reports occasional episodes of nausea without any vomiting episodes.  She denies any abdominal pain and her bowel habits have improved since initiating her bowel regimen with Linzess, MiraLAX and Benefiber.  She denies easy bruising or signs of bleeding.  She reports occasional episodes of dizziness without any syncopal episodes.  She denies fevers, chills, night sweats, shortness of breath, chest pain or cough. Rest of the 10 point ROS is below.  MEDICAL HISTORY:  Past Medical History:  Diagnosis  Date   Abnormal TSH    ADD (attention deficit disorder)    Anemia    Anxiety    Arrhythmia    Decreased libido    Depression    Heart murmur    Hyperlipidemia    Hypokalemia    Migraine    Nephrolithiasis    Pap smear abnormality of cervix/human papillomavirus (HPV) positive    Sleep difficulties    SVT (supraventricular tachycardia) ([place])     SURGICAL HISTORY: Past Surgical History:  Procedure Laterality Date   ABLATION     Cardiac ablation, endometrial ablation   BREAST LUMPECTOMY     right breast   BREAST LUMPECTOMY Right    CARDIOVERSION     COLONOSCOPY WITH PROPOFOL N/A 05/16/2021   Procedure: COLONOSCOPY WITH PROPOFOL;  Surgeon: Wilford Corner, MD;  Location: [facility];  Service: Endoscopy;  Laterality: N/A;   COLPOSCOPY     w/ cervical biopsy   DILITATION & CURRETTAGE/HYSTROSCOPY WITH NOVASURE ABLATION N/A 09/11/2014   Procedure: DILATATION & CURETTAGE/HYSTEROSCOPY WITH NOVASURE ABLATION;  Surgeon: Lovenia Kim, MD;  Location: [facility];  Service: Gynecology;  Laterality: N/A;   GIVENS CAPSULE STUDY N/A 05/16/2021   Procedure: GIVENS CAPSULE STUDY;  Surgeon: Wilford Corner, MD;  Location: [facility];  Service: Endoscopy;  Laterality: N/A;   XI ROBOT ASSISTED RECTOPEXY N/A 07/07/2021   Procedure: XI ROBOT ASSISTED RECTOPEXY;  Surgeon: [name], MD;  Location: [facility];  Service: General;  Laterality: N/A;   XI ROBOTIC ASSISTED LOWER ANTERIOR RESECTION N/A 07/07/2021   Procedure: XI ROBOTIC ASSISTED LOW ANTERIOR RECTOSIGMOID RESECTION WITH RIGID PROCTOSCOPY, TAP BLOCK;  Surgeon: [name], MD;  Location: [facility];  Service: General;  Laterality: N/A;    SOCIAL HISTORY: Social History   Socioeconomic History   Marital status: Married    Spouse name: Not on file   Number of children: 2   Years of education: college   Highest education level: Not on file  Occupational History   Occupation: Therapist, 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   Right-handed.   60 ounces of tea and soda per day.   Social Determinants of Health   Financial Resource Strain: Not on file  Food Insecurity: Not on file  Transportation Needs: Not on file  Physical Activity: Not on file  Stress: Not on file  Social Connections: Not on file  Intimate Partner Violence: Not on file    FAMILY HISTORY: Family History  Problem Relation Age of Onset   Hypothyroidism Mother    Depression Mother    Cancer Mother        thyroid   Thyroid disease Mother    Hypertension Father    Heart attack Father        Age 64   AAA (abdominal aortic aneurysm) Father    Hypothyroidism Sister    Hyperthyroidism Sister    Diabetes Maternal Grandfather    Breast cancer Paternal Grandmother     ALLERGIES:  is allergic to amoxicillin, bactrim [sulfamethoxazole-trimethoprim], and zithromax [azithromycin].  MEDICATIONS:  Current Outpatient Medications  Medication Sig Dispense Refill   amphetamine-dextroamphetamine (ADDERALL) 30 MG tablet Take 0.5 tablets by mouth every morning.     Ascorbic Acid (VITAMIN C PO) Take 1 tablet by mouth every morning.     CALCIUM PO Take 1 tablet by mouth every morning.     Cholecalciferol (VITAMIN D3 PO) Take 1 tablet by mouth every morning.     clonazePAM (KLONOPIN) 1 MG tablet Take 1 mg by mouth at bedtime.     doxepin (SINEQUAN) 75 MG capsule Take 75 mg by mouth at bedtime.     ferrous sulfate 325 (65 FE) MG EC tablet Take 1 tablet (325 mg total) by mouth daily with breakfast.  30 tablet 3   FLUoxetine (PROZAC) 20 MG capsule Take 60 mg by mouth every morning.     LINZESS 290 MCG CAPS capsule Take 290 mcg by mouth daily.     Menaquinone-7 (K2 PO) Take 1 tablet by mouth every morning.     Multiple Vitamin (MULTIVITAMIN WITH MINERALS) TABS tablet Take 1 tablet by mouth every morning.     NIACIN PO Take 1 tablet by mouth every morning.     oxyCODONE (OXY IR/ROXICODONE) 5 MG immediate release tablet Take 1-2 tablets (5-10 mg total) by mouth every 6 (six) hours as needed for moderate pain or severe pain (5mg  moderate, 10mg  severe). 20 tablet 0   pantoprazole (PROTONIX) 40 MG tablet Take 40 mg by mouth 2 (two) times daily.  polyethylene glycol (MIRALAX / GLYCOLAX) 17 g packet Take 17 g by mouth 2 (two) times daily. 14 each 0   potassium chloride SA (KLOR-CON) 20 MEQ tablet Take 2 tablets (40 mEq total) by mouth daily. (Patient taking differently: Take 40 mEq by mouth every morning.) 20 tablet 0   promethazine (PHENERGAN) 25 MG tablet Take 1 tablet (25 mg total) by mouth every 6 (six) hours as needed for nausea or vomiting. 20 tablet 0   sucralfate (CARAFATE) 1 g tablet Take 1 g by mouth 2 (two) times daily.     topiramate (TOPAMAX) 50 MG tablet Take 150 mg by mouth every morning.     TURMERIC PO Take 1 capsule by mouth every morning.     No current facility-administered medications for this visit.    REVIEW OF SYSTEMS:   Constitutional: ( - ) fevers, ( - )  chills , ( - ) night sweats Eyes: ( - ) blurriness of vision, ( - ) double vision, ( - ) watery eyes Ears, nose, mouth, throat, and face: ( - ) mucositis, ( - ) sore throat Respiratory: ( - ) cough, ( - ) dyspnea, ( - ) wheezes Cardiovascular: ( - ) palpitation, ( - ) chest discomfort, ( - ) lower extremity swelling Gastrointestinal:  ( + ) nausea, ( - ) heartburn, ( - ) change in bowel habits Skin: ( - ) abnormal skin rashes Lymphatics: ( - ) new lymphadenopathy, ( - ) easy bruising Neurological: ( - ) numbness,  ( - ) tingling, ( - ) new weaknesses Behavioral/Psych: ( - ) mood change, ( - ) new changes  All other systems were reviewed with the patient and are negative.  PHYSICAL EXAMINATION: ECOG PERFORMANCE STATUS: 1 - Symptomatic but completely ambulatory  Vitals:   08/16/21 1541  BP: 118/84  Pulse: 85  Resp: 18  Temp: (!) 97.5 F (36.4 C)  SpO2: 98%   Filed Weights   08/16/21 1541  Weight: 127 lb 1.6 oz (57.7 kg)    GENERAL: well appearing female in NAD  SKIN: skin color, texture, turgor are normal, no rashes or significant lesions EYES: conjunctiva are pink and non-injected, sclera clear OROPHARYNX: no exudate, no erythema; lips, buccal mucosa, and tongue normal  LYMPH:  no palpable lymphadenopathy in the cervical, axillary or supraclavicular lymph nodes.  LUNGS: clear to auscultation and percussion with normal breathing effort HEART: regular rate & rhythm and no murmurs and no lower extremity edema ABDOMEN: soft, non-tender, non-distended, normal bowel sounds Musculoskeletal: no cyanosis of digits and no clubbing  PSYCH: alert & oriented x 3, fluent speech NEURO: no focal motor/sensory deficits  LABORATORY DATA:  I have reviewed the data as listed CBC Latest Ref Rng & Units 08/09/2021 07/22/2021 07/10/2021  WBC 4.0 - 10.5 K/uL 9.0 9.6 8.0  Hemoglobin 12.0 - 15.0 g/dL 11.8(L) 10.3(L) 8.9(L)  Hematocrit 36.0 - 46.0 % 36.3 31.6(L) 28.0(L)  Platelets 150 - 400 K/uL 299 628(H) 354    CMP Latest Ref Rng & Units 07/22/2021 07/10/2021 07/09/2021  Glucose 70 - 99 mg/dL 104(H) 84 83  BUN 6 - 20 mg/dL 17 8 5(L)  Creatinine 0.44 - 1.00 mg/dL 0.72 0.61 0.58  Sodium 135 - 145 mmol/L 137 139 136  Potassium 3.5 - 5.1 mmol/L 3.0(L) 3.3(L) 3.1(L)  Chloride 98 - 111 mmol/L 106 107 103  CO2 22 - 32 mmol/L 22 29 28   Calcium 8.9 - 10.3 mg/dL 8.8(L) 7.8(L) 7.9(L)  Total Protein 6.5 - 8.1  g/dL - 4.8(L) 5.2(L)  Total Bilirubin 0.3 - 1.2 mg/dL - 0.4 0.4  Alkaline Phos 38 - 126 U/L - 46 46   AST 15 - 41 U/L - 27 28  ALT 0 - 44 U/L - 14 16    RADIOGRAPHIC STUDIES: I have personally reviewed the radiological images as listed and agreed with the findings in the report. No results found.  ASSESSMENT & PLAN Emily Park is a 50 y.o. female who presents to the clinic for a follow up for iron deficiency anemia.   #Iron deficiency anemia: - Patient denies any signs of active bleeding. Under the care of [facility]. Found to have gastric ulcers and gastroparesis.   --Received  weekly IV venofer x 5 doses from 06/03/2021-07/29/2021 --Labs from 08/09/2021 were reviewed that shows improvement of anemia with Hgb of 11.8. Iron panel shows no evidence of iron deficiency.  --Currently on ferrous sulfate 325 mg once daily. Recommend to continue and take with a source of vitamin C.  --RTC in 3 months for labs only and 6 months for an office visit/labs.   #Hypokalemia: --Patient is currently taking potassium chloride 40 mEq daily  --Advised to follow up with her PCP to monitor her levels.   No orders of the defined types were placed in this encounter.   All questions were answered. The patient knows to call the clinic with any problems, questions or concerns.  I have spent a total of 25 minutes minutes of face-to-face and non-face-to-face time, preparing to see the patient, obtaining and/or reviewing separately obtained history, performing a medically appropriate examination, counseling and educating the patient, ordering medications/tests, documenting clinical information in the electronic health record, and care coordination.   Dede Query, PA-C Department of Hematology/Oncology [place] at [facility] Phone: [phone]

## 2021-09-23 ENCOUNTER — Other Ambulatory Visit: Payer: Self-pay | Admitting: Physician Assistant

## 2021-11-14 ENCOUNTER — Other Ambulatory Visit: Payer: Self-pay | Admitting: Physician Assistant

## 2021-11-14 DIAGNOSIS — D508 Other iron deficiency anemias: Secondary | ICD-10-CM

## 2021-11-15 ENCOUNTER — Inpatient Hospital Stay: Payer: Commercial Managed Care - PPO

## 2021-11-22 ENCOUNTER — Inpatient Hospital Stay: Payer: Commercial Managed Care - PPO | Attending: Physician Assistant

## 2021-11-22 ENCOUNTER — Other Ambulatory Visit: Payer: Self-pay

## 2021-11-22 DIAGNOSIS — D508 Other iron deficiency anemias: Secondary | ICD-10-CM | POA: Insufficient documentation

## 2021-11-22 LAB — CBC WITH DIFFERENTIAL (CANCER CENTER ONLY)
Abs Immature Granulocytes: 0.05 10*3/uL (ref 0.00–0.07)
Basophils Absolute: 0.1 10*3/uL (ref 0.0–0.1)
Basophils Relative: 1 %
Eosinophils Absolute: 0.3 10*3/uL (ref 0.0–0.5)
Eosinophils Relative: 4 %
HCT: 37.1 % (ref 36.0–46.0)
Hemoglobin: 12.9 g/dL (ref 12.0–15.0)
Immature Granulocytes: 1 %
Lymphocytes Relative: 28 %
Lymphs Abs: 2 10*3/uL (ref 0.7–4.0)
MCH: 31 pg (ref 26.0–34.0)
MCHC: 34.8 g/dL (ref 30.0–36.0)
MCV: 89.2 fL (ref 80.0–100.0)
Monocytes Absolute: 0.6 10*3/uL (ref 0.1–1.0)
Monocytes Relative: 8 %
Neutro Abs: 4.4 10*3/uL (ref 1.7–7.7)
Neutrophils Relative %: 58 %
Platelet Count: 350 10*3/uL (ref 150–400)
RBC: 4.16 MIL/uL (ref 3.87–5.11)
RDW: 14.3 % (ref 11.5–15.5)
WBC Count: 7.4 10*3/uL (ref 4.0–10.5)
nRBC: 0 % (ref 0.0–0.2)

## 2021-11-22 LAB — FERRITIN: Ferritin: 60 ng/mL (ref 11–307)

## 2021-11-22 LAB — IRON AND IRON BINDING CAPACITY (CC-WL,HP ONLY)
Iron: 108 ug/dL (ref 28–170)
Saturation Ratios: 36 % — ABNORMAL HIGH (ref 10.4–31.8)
TIBC: 297 ug/dL (ref 250–450)
UIBC: 189 ug/dL (ref 148–442)

## 2021-11-23 ENCOUNTER — Telehealth: Payer: Self-pay

## 2021-11-23 NOTE — Telephone Encounter (Signed)
-----   Message from Lincoln Brigham, PA-C sent at 11/23/2021  8:54 AM EDT ----- ?Please notify patient that labs show no evidence of anemia or iron deficiency. Okay to continue on iron pills.  ?

## 2021-11-23 NOTE — Telephone Encounter (Signed)
LM for pt with lab results and to continue oral iron. ?

## 2021-12-05 ENCOUNTER — Other Ambulatory Visit: Payer: Self-pay | Admitting: Gastroenterology

## 2021-12-05 DIAGNOSIS — R1319 Other dysphagia: Secondary | ICD-10-CM

## 2021-12-06 ENCOUNTER — Other Ambulatory Visit: Payer: Commercial Managed Care - PPO

## 2021-12-08 ENCOUNTER — Ambulatory Visit
Admission: RE | Admit: 2021-12-08 | Discharge: 2021-12-08 | Disposition: A | Discharge status: Home / Self Care | Payer: Commercial Managed Care - PPO | Source: Ambulatory Visit | Attending: Gastroenterology | Admitting: Gastroenterology

## 2021-12-08 DIAGNOSIS — R1319 Other dysphagia: Secondary | ICD-10-CM

## 2022-01-20 ENCOUNTER — Telehealth: Payer: Self-pay | Admitting: Physician Assistant

## 2022-01-20 NOTE — Telephone Encounter (Signed)
Scheduled per provider pal, message has been left with pt

## 2022-02-14 ENCOUNTER — Other Ambulatory Visit: Payer: Commercial Managed Care - PPO

## 2022-02-14 ENCOUNTER — Ambulatory Visit: Payer: Commercial Managed Care - PPO | Admitting: Physician Assistant

## 2022-02-20 ENCOUNTER — Other Ambulatory Visit: Payer: Self-pay | Admitting: Physician Assistant

## 2022-02-20 DIAGNOSIS — D508 Other iron deficiency anemias: Secondary | ICD-10-CM

## 2022-02-21 ENCOUNTER — Inpatient Hospital Stay: Payer: Commercial Managed Care - PPO | Admitting: Physician Assistant

## 2022-02-21 ENCOUNTER — Telehealth: Payer: Self-pay

## 2022-02-21 ENCOUNTER — Inpatient Hospital Stay: Payer: Commercial Managed Care - PPO | Attending: Physician Assistant

## 2022-02-21 ENCOUNTER — Other Ambulatory Visit: Payer: Self-pay

## 2022-02-21 ENCOUNTER — Inpatient Hospital Stay: Payer: Commercial Managed Care - PPO

## 2022-02-21 VITALS — BP 102/81 | HR 125 | Temp 97.3°F | Resp 18 | Wt 118.4 lb

## 2022-02-21 DIAGNOSIS — K259 Gastric ulcer, unspecified as acute or chronic, without hemorrhage or perforation: Secondary | ICD-10-CM | POA: Diagnosis not present

## 2022-02-21 DIAGNOSIS — E860 Dehydration: Secondary | ICD-10-CM | POA: Insufficient documentation

## 2022-02-21 DIAGNOSIS — R55 Syncope and collapse: Secondary | ICD-10-CM | POA: Insufficient documentation

## 2022-02-21 DIAGNOSIS — Z803 Family history of malignant neoplasm of breast: Secondary | ICD-10-CM | POA: Insufficient documentation

## 2022-02-21 DIAGNOSIS — Z79899 Other long term (current) drug therapy: Secondary | ICD-10-CM | POA: Insufficient documentation

## 2022-02-21 DIAGNOSIS — Z833 Family history of diabetes mellitus: Secondary | ICD-10-CM | POA: Diagnosis not present

## 2022-02-21 DIAGNOSIS — R1310 Dysphagia, unspecified: Secondary | ICD-10-CM | POA: Diagnosis not present

## 2022-02-21 DIAGNOSIS — R059 Cough, unspecified: Secondary | ICD-10-CM | POA: Diagnosis not present

## 2022-02-21 DIAGNOSIS — R197 Diarrhea, unspecified: Secondary | ICD-10-CM | POA: Insufficient documentation

## 2022-02-21 DIAGNOSIS — E876 Hypokalemia: Secondary | ICD-10-CM | POA: Diagnosis not present

## 2022-02-21 DIAGNOSIS — R0602 Shortness of breath: Secondary | ICD-10-CM | POA: Insufficient documentation

## 2022-02-21 DIAGNOSIS — Z8249 Family history of ischemic heart disease and other diseases of the circulatory system: Secondary | ICD-10-CM | POA: Diagnosis not present

## 2022-02-21 DIAGNOSIS — K3184 Gastroparesis: Secondary | ICD-10-CM | POA: Insufficient documentation

## 2022-02-21 DIAGNOSIS — R194 Change in bowel habit: Secondary | ICD-10-CM | POA: Insufficient documentation

## 2022-02-21 DIAGNOSIS — Z8349 Family history of other endocrine, nutritional and metabolic diseases: Secondary | ICD-10-CM | POA: Diagnosis not present

## 2022-02-21 DIAGNOSIS — Z880 Allergy status to penicillin: Secondary | ICD-10-CM | POA: Diagnosis not present

## 2022-02-21 DIAGNOSIS — D509 Iron deficiency anemia, unspecified: Secondary | ICD-10-CM | POA: Insufficient documentation

## 2022-02-21 DIAGNOSIS — R110 Nausea: Secondary | ICD-10-CM | POA: Diagnosis not present

## 2022-02-21 DIAGNOSIS — Z818 Family history of other mental and behavioral disorders: Secondary | ICD-10-CM | POA: Diagnosis not present

## 2022-02-21 DIAGNOSIS — D508 Other iron deficiency anemias: Secondary | ICD-10-CM

## 2022-02-21 DIAGNOSIS — R7989 Other specified abnormal findings of blood chemistry: Secondary | ICD-10-CM | POA: Insufficient documentation

## 2022-02-21 DIAGNOSIS — Z881 Allergy status to other antibiotic agents status: Secondary | ICD-10-CM | POA: Diagnosis not present

## 2022-02-21 LAB — CBC WITH DIFFERENTIAL (CANCER CENTER ONLY)
Abs Immature Granulocytes: 0.04 10*3/uL (ref 0.00–0.07)
Basophils Absolute: 0.1 10*3/uL (ref 0.0–0.1)
Basophils Relative: 1 %
Eosinophils Absolute: 0.1 10*3/uL (ref 0.0–0.5)
Eosinophils Relative: 1 %
HCT: 46.4 % — ABNORMAL HIGH (ref 36.0–46.0)
Hemoglobin: 16.5 g/dL — ABNORMAL HIGH (ref 12.0–15.0)
Immature Granulocytes: 0 %
Lymphocytes Relative: 36 %
Lymphs Abs: 4.2 10*3/uL — ABNORMAL HIGH (ref 0.7–4.0)
MCH: 31.3 pg (ref 26.0–34.0)
MCHC: 35.6 g/dL (ref 30.0–36.0)
MCV: 87.9 fL (ref 80.0–100.0)
Monocytes Absolute: 0.9 10*3/uL (ref 0.1–1.0)
Monocytes Relative: 8 %
Neutro Abs: 6.2 10*3/uL (ref 1.7–7.7)
Neutrophils Relative %: 54 %
Platelet Count: 412 10*3/uL — ABNORMAL HIGH (ref 150–400)
RBC: 5.28 MIL/uL — ABNORMAL HIGH (ref 3.87–5.11)
RDW: 12.2 % (ref 11.5–15.5)
WBC Count: 11.6 10*3/uL — ABNORMAL HIGH (ref 4.0–10.5)
nRBC: 0 % (ref 0.0–0.2)

## 2022-02-21 LAB — CMP (CANCER CENTER ONLY)
ALT: 10 U/L (ref 0–44)
AST: 20 U/L (ref 15–41)
Albumin: 5.1 g/dL — ABNORMAL HIGH (ref 3.5–5.0)
Alkaline Phosphatase: 65 U/L (ref 38–126)
Anion gap: 14 (ref 5–15)
BUN: 35 mg/dL — ABNORMAL HIGH (ref 6–20)
CO2: 23 mmol/L (ref 22–32)
Calcium: 10.5 mg/dL — ABNORMAL HIGH (ref 8.9–10.3)
Chloride: 93 mmol/L — ABNORMAL LOW (ref 98–111)
Creatinine: 1.8 mg/dL — ABNORMAL HIGH (ref 0.44–1.00)
GFR, Estimated: 33 mL/min — ABNORMAL LOW (ref >60)
Glucose, Bld: 115 mg/dL — ABNORMAL HIGH (ref 70–99)
Potassium: 2.6 mmol/L — CL (ref 3.5–5.1)
Sodium: 130 mmol/L — ABNORMAL LOW (ref 135–145)
Total Bilirubin: 0.5 mg/dL (ref 0.3–1.2)
Total Protein: 8.8 g/dL — ABNORMAL HIGH (ref 6.5–8.1)

## 2022-02-21 LAB — IRON AND IRON BINDING CAPACITY (CC-WL,HP ONLY)
Iron: 136 ug/dL (ref 28–170)
Saturation Ratios: 40 % — ABNORMAL HIGH (ref 10.4–31.8)
TIBC: 337 ug/dL (ref 250–450)
UIBC: 201 ug/dL (ref 148–442)

## 2022-02-21 LAB — FERRITIN: Ferritin: 63 ng/mL (ref 11–307)

## 2022-02-21 MED ORDER — POTASSIUM CHLORIDE CRYS ER 20 MEQ PO TBCR: 40.0000 meq | EXTENDED_RELEASE_TABLET | Freq: Once | ORAL | Status: DC

## 2022-02-21 MED ORDER — SODIUM CHLORIDE 0.9 % IV SOLN: INTRAVENOUS | Status: DC

## 2022-02-21 MED ORDER — POTASSIUM CHLORIDE IN NACL 40-0.9 MEQ/L-% IV SOLN
Freq: Once | INTRAVENOUS | Status: AC
  Filled 2022-02-21: qty 1000

## 2022-02-21 MED ORDER — SODIUM CHLORIDE 0.9 % IV SOLN: Freq: Once | INTRAVENOUS | Status: DC

## 2022-02-21 MED ORDER — SODIUM CHLORIDE 0.9 % IV SOLN: INTRAVENOUS | Status: AC

## 2022-02-21 MED ORDER — POTASSIUM CHLORIDE CRYS ER 20 MEQ PO TBCR
40.0000 meq | EXTENDED_RELEASE_TABLET | Freq: Once | ORAL | Status: AC
  Administered 2022-02-21: 40 meq via ORAL
  Filled 2022-02-21: qty 2

## 2022-02-21 MED ORDER — FAMOTIDINE 20 MG PO TABS
20.0000 mg | ORAL_TABLET | Freq: Once | ORAL | Status: AC
  Administered 2022-02-21: 20 mg via ORAL
  Filled 2022-02-21: qty 1

## 2022-02-22 ENCOUNTER — Telehealth: Payer: Self-pay

## 2022-02-22 NOTE — Progress Notes (Signed)
[place] [phone]   Fax:(336) [phone]  PROGRESS NOTE  Patient Care Team: [name], MD as PCP - General (Internal Medicine) [name], MD as Consulting Physician (Colon and Rectal Surgery) Otis Brace, MD as Consulting Physician (Gastroenterology)  Hematological/Oncological History 1) 04/04/2021: Labs from [facility]: -Ferritin 15.6 (L), TIBC 424, Serum iron <10 (L),iron saturation was too low to calculate, transferrin 303. WBC 17.6 (H), Hgb 8.7 (L), MCV 76.9 (L), Plt 548 (H), ANC 13.6 (H), Vitamin B12 788.   2) 04/13/2021-04/14/2021: Admitted for nausea, vomiting or diarrhea. Labs show ferritin <5 (L), iron 20 (L), TIBC 387, iron saturation 5% (L), WBC 6.9, Hgb 8.2 (L), MCV 86.0, Plt 408 (L).   3) 05/13/2021: Establish care with [facility]ematology/Oncology  4) 06/03/2021-07/29/2021: Received IV venofer x 5 doses.   CHIEF COMPLAINTS/PURPOSE OF CONSULTATION:  "Iron deficiency anemia "  HISTORY OF PRESENTING ILLNESS:  Emily Park 51 y.o. female returns for a follow up for iron deficiency anemia. She is accompanied by her husband for this visit.   On exam today. Emily Park reports her energy levels have declined in the recent few months.  She is dizzy frequently and last Friday she had a syncopal episode.  She was not evaluated after the syncopal episode.  She reports ongoing nausea and vomiting episodes.  She takes Phenergan with minimal improvement.  Patient also has some dysphagia with regurgitation.  She reports that since starting Linzess, she has had more frequent episodes of diarrhea.  She reports having up to 5 episodes per day.  She is not taking any antidiarrheals at this time.  She is awaiting a consultation with gastroenterology at [facility] currently scheduled for September 2023. He reports having shortness of breath mainly with exertion and has a dry cough. She denies fevers, chills, night sweats or chest pain. Rest of the 10 point ROS is  below.  MEDICAL HISTORY:  Past Medical History:  Diagnosis Date   Abnormal TSH    ADD (attention deficit disorder)    Anemia    Anxiety    Arrhythmia    Decreased libido    Depression    Heart murmur    Hyperlipidemia    Hypokalemia    Migraine    Nephrolithiasis    Pap smear abnormality of cervix/human papillomavirus (HPV) positive    Sleep difficulties    SVT (supraventricular tachycardia) ([place])     SURGICAL HISTORY: Past Surgical History:  Procedure Laterality Date   ABLATION     Cardiac ablation, endometrial ablation   BREAST LUMPECTOMY     right breast   BREAST LUMPECTOMY Right    CARDIOVERSION     COLONOSCOPY WITH PROPOFOL N/A 05/16/2021   Procedure: COLONOSCOPY WITH PROPOFOL;  Surgeon: Wilford Corner, MD;  Location: [facility];  Service: Endoscopy;  Laterality: N/A;   COLPOSCOPY     w/ cervical biopsy   DILITATION & CURRETTAGE/HYSTROSCOPY WITH NOVASURE ABLATION N/A 09/11/2014   Procedure: DILATATION & CURETTAGE/HYSTEROSCOPY WITH NOVASURE ABLATION;  Surgeon: Lovenia Kim, MD;  Location: [facility];  Service: Gynecology;  Laterality: N/A;   GIVENS CAPSULE STUDY N/A 05/16/2021   Procedure: GIVENS CAPSULE STUDY;  Surgeon: Wilford Corner, MD;  [facility]: [facility];  Service: Endoscopy;  Laterality: N/A;   XI ROBOT ASSISTED RECTOPEXY N/A 07/07/2021   Procedure: XI ROBOT ASSISTED RECTOPEXY;  Surgeon: [name], MD;  Location: [facility];  Service: General;  Laterality: N/A;   XI ROBOTIC ASSISTED LOWER ANTERIOR RESECTION N/A 07/07/2021  Procedure: XI ROBOTIC ASSISTED LOW ANTERIOR RECTOSIGMOID RESECTION WITH RIGID PROCTOSCOPY, TAP BLOCK;  Surgeon: [name], MD;  Location: [facility];  Service: General;  Laterality: N/A;    SOCIAL HISTORY: Social History   Socioeconomic History   Marital status: Married    Spouse name: Not on file   Number of children: 2   Years of education: college   Highest education level: Not on file  Occupational History   Occupation:  Therapist, 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   Right-handed.   60 ounces of tea and soda per day.   Social Determinants of Health   Financial Resource Strain: Not on file  Food Insecurity: Not on file  Transportation Needs: Not on file  Physical Activity: Not on file  Stress: Not on file  Social Connections: Not on file  Intimate Partner Violence: Not on file    FAMILY HISTORY: Family History  Problem Relation Age of Onset   Hypothyroidism Mother    Depression Mother    Cancer Mother        thyroid   Thyroid disease Mother    Hypertension Father    Heart attack Father        Age 60   AAA (abdominal aortic aneurysm) Father    Hypothyroidism Sister    Hyperthyroidism Sister    Diabetes Maternal Grandfather    Breast cancer Paternal Grandmother     ALLERGIES:  is allergic to amoxicillin, bactrim [sulfamethoxazole-trimethoprim], and zithromax [azithromycin].  MEDICATIONS:  Current Outpatient Medications  Medication Sig Dispense Refill   amphetamine-dextroamphetamine (ADDERALL) 30 MG tablet Take 0.5 tablets by mouth every morning.     Ascorbic Acid (VITAMIN C PO) Take 1 tablet by mouth every morning.     CALCIUM PO Take 1 tablet by mouth every morning.     Cholecalciferol (VITAMIN D3 PO) Take 1 tablet by mouth every morning.     clonazePAM (KLONOPIN) 1 MG tablet Take 1 mg by mouth at bedtime.     doxepin (SINEQUAN) 75 MG capsule Take 75 mg by mouth at bedtime.     ferrous sulfate 325 (65 FE) MG  EC tablet Take 1 tablet (325 mg total) by mouth daily with breakfast. 30 tablet 3   LINZESS 290 MCG CAPS capsule Take 290 mcg by mouth daily.     Menaquinone-7 (K2 PO) Take 1 tablet by mouth every morning.     Multiple Vitamin (MULTIVITAMIN WITH MINERALS) TABS tablet Take 1 tablet by mouth every morning.     NIACIN PO Take 1 tablet by mouth every morning.     pantoprazole (PROTONIX) 40 MG tablet Take 40 mg by mouth 2 (two) times daily.     polyethylene glycol (MIRALAX / GLYCOLAX) 17 g packet Take 17 g by mouth 2 (two) times daily. 14 each 0   potassium chloride SA (KLOR-CON) 20 MEQ tablet Take  2 tablets (40 mEq total) by mouth daily. (Patient taking differently: Take 40 mEq by mouth every morning.) 20 tablet 0   promethazine (PHENERGAN) 25 MG tablet Take 1 tablet (25 mg total) by mouth every 6 (six) hours as needed for nausea or vomiting. 20 tablet 0   sucralfate (CARAFATE) 1 g tablet Take 1 g by mouth 4 (four) times daily.     topiramate (TOPAMAX) 50 MG tablet Take 150 mg by mouth every morning.     TURMERIC PO Take 1 capsule by mouth every morning.     Vortioxetine HBr (TRINTELLIX PO) Take by mouth.     FLUoxetine (PROZAC) 20 MG capsule Take 60 mg by mouth every morning. (Patient not taking: Reported on 02/21/2022)     oxyCODONE (OXY IR/ROXICODONE) 5 MG immediate release tablet Take 1-2 tablets (5-10 mg total) by mouth every 6 (six) hours as needed for moderate pain or severe pain ('5mg'$  moderate, '10mg'$  severe). (Patient not taking: Reported on 02/21/2022) 20 tablet 0   No current facility-administered medications for this visit.    REVIEW OF SYSTEMS:   Constitutional: ( - ) fevers, ( - )  chills , ( - ) night sweats Eyes: ( - ) blurriness of vision, ( - ) double vision, ( - ) watery eyes Ears, nose, mouth, throat, and face: ( - ) mucositis, ( - ) sore throat Respiratory: ( - ) cough, ( - ) dyspnea, ( - ) wheezes Cardiovascular: ( - ) palpitation, ( - ) chest discomfort, ( - ) lower extremity  swelling Gastrointestinal:  ( + ) nausea, ( - ) heartburn, (+ ) change in bowel habits Skin: ( - ) abnormal skin rashes Lymphatics: ( - ) new lymphadenopathy, ( - ) easy bruising Neurological: ( - ) numbness, ( - ) tingling, ( - ) new weaknesses Behavioral/Psych: ( - ) mood change, ( - ) new changes  All other systems were reviewed with the patient and are negative.  PHYSICAL EXAMINATION: ECOG PERFORMANCE STATUS: 1 - Symptomatic but completely ambulatory  Vitals:   02/21/22 1015  BP: 102/81  Pulse: (!) 125  Resp: 18  Temp: (!) 97.3 F (36.3 C)   Filed Weights   02/21/22 1015  Weight: 118 lb 6.4 oz (53.7 kg)    GENERAL: female in NAD  SKIN: skin color, texture, turgor are normal, no rashes or significant lesions EYES: conjunctiva are pink and non-injected, sclera clear LUNGS: clear to auscultation and percussion with normal breathing effort HEART: regular rhythm but tachycardic. No murmurs and no lower extremity edema Musculoskeletal: no cyanosis of digits and no clubbing  PSYCH: alert & oriented x 3, fluent speech NEURO: no focal motor/sensory deficits.  LABORATORY DATA:  I have reviewed the data as listed    Latest Ref Rng & Units 02/21/2022    9:55 AM 11/22/2021   10:26 AM 08/09/2021    8:38 AM  CBC  WBC 4.0 - 10.5 K/uL 11.6  7.4  9.0   Hemoglobin 12.0 - 15.0 g/dL 16.5  12.9  11.8   Hematocrit 36.0 - 46.0 % 46.4  37.1  36.3   Platelets 150 - 400 K/uL 412  350  299        Latest Ref Rng & Units 02/21/2022    9:55 AM 07/22/2021   12:59 PM 07/10/2021    5:23 AM  CMP  Glucose 70 - 99 mg/dL 115  104  84   BUN 6 - 20 mg/dL 35  17  8  Creatinine 0.44 - 1.00 mg/dL 1.80  0.72  0.61   Sodium 135 - 145 mmol/L 130  137  139   Potassium 3.5 - 5.1 mmol/L 2.6  3.0  3.3   Chloride 98 - 111 mmol/L 93  106  107   CO2 22 - 32 mmol/L '23  22  29   '$ Calcium 8.9 - 10.3 mg/dL 10.5  8.8  7.8   Total Protein 6.5 - 8.1 g/dL 8.8   4.8   Total Bilirubin 0.3 - 1.2 mg/dL 0.5   0.4    Alkaline Phos 38 - 126 U/L 65   46   AST 15 - 41 U/L 20   27   ALT 0 - 44 U/L 10   14     RADIOGRAPHIC STUDIES: I have personally reviewed the radiological images as listed and agreed with the findings in the report. No results found.  ASSESSMENT & PLAN Emily Park is a 51 y.o. female who presents to the clinic for a follow up for iron deficiency anemia.   #Iron deficiency anemia: - Patient denies any signs of active bleeding. Under the care of [facility]. Found to have gastric ulcers and gastroparesis.   --Received  weekly IV venofer x 5 doses from 06/03/2021-07/29/2021 --Labs from today shows Hgb 16.5 Iron panel shows Saturation 40%, iron 136, ferritin 63.  --Okay to hold PO iron at this time.   #Hypokalemia-likely due to vomiting/diarrhea: --Labs today show worsening potassium level measuring 2.6.  --Gave 40 mEq of IV and oral potassium tomorrow.  --Recommend to continue PO potassium chloride 40 mEq a day  #Dehydration/Elevated creatinine levels: --Creatinine was 1.80 today --Gave 1 L of IV fluids today  #GI symptoms--N/V and diarrhea: --We reached out to [facility] to follow up with patient to address her ongoing symptoms.  --Encouraged to take phenergan for   Follow up: -RTC on Thursday, 02/23/2022 to repeat labs and slot for IV fluids/electrolytes as needed.  -RTC in 3 months for labs to follow anemia.  -Requested PCP, Dr. Jacalyn Lefevre to follow potassium and creatinine levels.    Orders Placed This Encounter  Procedures   CBC with Differential ([place] Only)    Standing Status:   Future    Standing Expiration Date:   02/23/2023   CMP ([place] only)    Standing Status:   Future    Standing Expiration Date:   02/23/2023   Magnesium    Standing Status:   Future    Standing Expiration Date:   02/22/2023    All questions were answered. The patient knows to call the clinic with any problems, questions or concerns.  I have spent a total of 30 minutes minutes of  face-to-face and non-face-to-face time, preparing to see the patient, obtaining and/or reviewing separately obtained history, performing a medically appropriate examination, counseling and educating the patient, ordering medications/tests, documenting clinical information in the electronic health record, and care coordination.   Dede Query, PA-C Department of Hematology/Oncology [place] at [facility] Phone: [phone]

## 2022-02-22 NOTE — Telephone Encounter (Signed)
please notify her that she can hold iron pills for now as  Hgb is slightly above normal and no evidence of iron deficiency Can you also reach out to her PCP, Dr. Jacalyn Lefevre and notify them about her potassium and creatinine levels. They will need to follow up closely.     Attempted to contact pt to follow up after yesterdays appointment and IV fluids.  LM to advise pt of Hgb and iron levels and to hold oral iron.  Also pt needs to f/up with Dr Jacalyn Lefevre.  Faxed yesterdays office note to Dr Jacalyn Lefevre and confirmation received. Also LM with Dr Andres Ege nurse Evelena Peat to look for office note and lab results.

## 2022-02-23 ENCOUNTER — Other Ambulatory Visit: Payer: Self-pay

## 2022-02-23 ENCOUNTER — Inpatient Hospital Stay: Payer: Commercial Managed Care - PPO

## 2022-02-23 VITALS — BP 98/67 | HR 80 | Temp 97.7°F | Resp 16

## 2022-02-23 DIAGNOSIS — E860 Dehydration: Secondary | ICD-10-CM

## 2022-02-23 DIAGNOSIS — D509 Iron deficiency anemia, unspecified: Secondary | ICD-10-CM | POA: Diagnosis not present

## 2022-02-23 DIAGNOSIS — E876 Hypokalemia: Secondary | ICD-10-CM

## 2022-02-23 DIAGNOSIS — R109 Unspecified abdominal pain: Secondary | ICD-10-CM

## 2022-02-23 LAB — CMP (CANCER CENTER ONLY)
ALT: 11 U/L (ref 0–44)
AST: 15 U/L (ref 15–41)
Albumin: 3.9 g/dL (ref 3.5–5.0)
Alkaline Phosphatase: 54 U/L (ref 38–126)
Anion gap: 6 (ref 5–15)
BUN: 37 mg/dL — ABNORMAL HIGH (ref 6–20)
CO2: 25 mmol/L (ref 22–32)
Calcium: 8.9 mg/dL (ref 8.9–10.3)
Chloride: 104 mmol/L (ref 98–111)
Creatinine: 1.27 mg/dL — ABNORMAL HIGH (ref 0.44–1.00)
GFR, Estimated: 51 mL/min — ABNORMAL LOW (ref >60)
Glucose, Bld: 74 mg/dL (ref 70–99)
Potassium: 2.9 mmol/L — ABNORMAL LOW (ref 3.5–5.1)
Sodium: 135 mmol/L (ref 135–145)
Total Bilirubin: 0.2 mg/dL — ABNORMAL LOW (ref 0.3–1.2)
Total Protein: 6.4 g/dL — ABNORMAL LOW (ref 6.5–8.1)

## 2022-02-23 LAB — MAGNESIUM: Magnesium: 1.5 mg/dL — ABNORMAL LOW (ref 1.7–2.4)

## 2022-02-23 LAB — CBC WITH DIFFERENTIAL (CANCER CENTER ONLY)
Abs Immature Granulocytes: 0.02 10*3/uL (ref 0.00–0.07)
Basophils Absolute: 0.1 10*3/uL (ref 0.0–0.1)
Basophils Relative: 1 %
Eosinophils Absolute: 0.3 10*3/uL (ref 0.0–0.5)
Eosinophils Relative: 3 %
HCT: 35 % — ABNORMAL LOW (ref 36.0–46.0)
Hemoglobin: 12.2 g/dL (ref 12.0–15.0)
Immature Granulocytes: 0 %
Lymphocytes Relative: 41 %
Lymphs Abs: 3.2 10*3/uL (ref 0.7–4.0)
MCH: 31.4 pg (ref 26.0–34.0)
MCHC: 34.9 g/dL (ref 30.0–36.0)
MCV: 90.2 fL (ref 80.0–100.0)
Monocytes Absolute: 0.6 10*3/uL (ref 0.1–1.0)
Monocytes Relative: 8 %
Neutro Abs: 3.6 10*3/uL (ref 1.7–7.7)
Neutrophils Relative %: 47 %
Platelet Count: 253 10*3/uL (ref 150–400)
RBC: 3.88 MIL/uL (ref 3.87–5.11)
RDW: 12.3 % (ref 11.5–15.5)
WBC Count: 7.8 10*3/uL (ref 4.0–10.5)
nRBC: 0 % (ref 0.0–0.2)

## 2022-02-23 MED ORDER — TRAMADOL HCL 50 MG PO TABS
50.0000 mg | ORAL_TABLET | Freq: Once | ORAL | Status: AC
  Administered 2022-02-23: 50 mg via ORAL
  Filled 2022-02-23: qty 1

## 2022-02-23 MED ORDER — POTASSIUM CHLORIDE IN NACL 40-0.9 MEQ/L-% IV SOLN
Freq: Once | INTRAVENOUS | Status: AC
  Filled 2022-02-23: qty 1000

## 2022-02-23 MED ORDER — SODIUM CHLORIDE 0.9 % IV SOLN: Freq: Once | INTRAVENOUS | Status: AC

## 2022-02-23 MED ORDER — MAGNESIUM SULFATE 4 GM/100ML IV SOLN
4.0000 g | Freq: Once | INTRAVENOUS | Status: AC
  Administered 2022-02-23: 4 g via INTRAVENOUS
  Filled 2022-02-23: qty 100

## 2022-02-23 MED ORDER — SODIUM CHLORIDE 0.9 % IV SOLN: Freq: Once | INTRAVENOUS | Status: DC

## 2022-02-23 NOTE — Progress Notes (Signed)
1 L over 2 hours per [place], [place].

## 2022-02-23 NOTE — Progress Notes (Signed)
Labs for Monday 7/3.

## 2022-02-23 NOTE — Patient Instructions (Signed)
Potassium Acetate Injection What is this medication? POTASSIUM ACETATE (poe TASS i um ASa tate) prevents and treats low levels of potassium in your body. Potassium plays an important role in maintaining the health of your kidneys, heart, muscles, and nervous system. This medicine may be used for other purposes; ask your health care provider or pharmacist if you have questions. What should I tell my care team before I take this medication? They need to know if you have any of these conditions: Addison's disease Dehydration Diabetes Heart disease High levels of potassium in the blood Irregular heartbeat Kidney disease Liver disease Recent severe burn An unusual or allergic reaction to potassium, other medications, foods, dyes, or preservatives Pregnant or trying to get pregnant Breast-feeding How should I use this medication? This medication is for infusion into a vein. It is given in a hospital or clinic setting. Talk to your care team about the use of this medication in children. Special care may be needed. Overdosage: If you think you have taken too much of this medicine contact a poison control center or emergency room at once. NOTE: This medicine is only for you. Do not share this medicine with others. What if I miss a dose? This does not apply. What may interact with this medication? Do not take this medication with any of the following: Certain diuretics such as spironolactone, triamterene Eplerenone Sodium polystyrene sulfonate This medication may also interact with the following: Certain medications for blood pressure or heart disease like lisinopril, losartan, quinapril, valsartan Medications that lower your chance of fighting infection such as cyclosporine, tacrolimus NSAIDs, medications for pain and inflammation, like ibuprofen or naproxen Other potassium supplements Salt substitutes This list may not describe all possible interactions. Give your health care provider a  list of all the medicines, herbs, non-prescription drugs, or dietary supplements you use. Also tell them if you smoke, drink alcohol, or use illegal drugs. Some items may interact with your medicine. What should I watch for while using this medication? Your condition will be monitored carefully while you are receiving this medication. You may need blood work done while you are taking this medication. What side effects may I notice from receiving this medication? Side effects that you should report to your care team as soon as possible: Allergic reactions--skin rash, itching, hives, swelling of the face, lips, tongue, or throat High potassium level--muscle weakness, fast or irregular heartbeat Side effects that usually do not require medical attention (report these to your care team if they continue or are bothersome): Pain, redness, or irritation at injection site This list may not describe all possible side effects. Call your doctor for medical advice about side effects. You may report side effects to FDA at 1-800-FDA-1088. Where should I keep my medication? This medication is given in a hospital or clinic and will not be stored at home. NOTE: This sheet is a summary. It may not cover all possible information. If you have questions about this medicine, talk to your doctor, pharmacist, or health care provider.  2023 Elsevier/Gold Standard (2021-04-21 00:00:00)  

## 2022-02-24 ENCOUNTER — Telehealth: Payer: Self-pay

## 2022-02-24 ENCOUNTER — Telehealth: Payer: Self-pay | Admitting: Physician Assistant

## 2022-02-24 NOTE — Telephone Encounter (Signed)
Spoke with Jessi at Dr Andres Ege office.  They have contacted GI and feel that would be the most appropriate route for pt to take regarding pt's chronic diarrhea and to follow potassium and creatinine levels. .  They have scheduled her for 7/3 with Dr Alessandra Bevels.  They asked on pt's behalf if we can reschedule her 7/3 appt for labs and fluids  to 7/5?  Scheduling and infusion have been notified and she has been rescheduled for 7/5 at 11:00.   Mollie/scheduling LM for pt's husband with new date and time.

## 2022-02-24 NOTE — Telephone Encounter (Signed)
Scheduled per 6/30 secure chat, message has been  left

## 2022-02-27 ENCOUNTER — Ambulatory Visit: Payer: Commercial Managed Care - PPO

## 2022-03-01 ENCOUNTER — Other Ambulatory Visit: Payer: Self-pay

## 2022-03-01 ENCOUNTER — Other Ambulatory Visit: Payer: Self-pay | Admitting: Physician Assistant

## 2022-03-01 ENCOUNTER — Inpatient Hospital Stay: Payer: Commercial Managed Care - PPO | Attending: Physician Assistant

## 2022-03-01 ENCOUNTER — Inpatient Hospital Stay: Payer: Commercial Managed Care - PPO

## 2022-03-01 ENCOUNTER — Encounter: Payer: Self-pay | Admitting: General Practice

## 2022-03-01 DIAGNOSIS — R7989 Other specified abnormal findings of blood chemistry: Secondary | ICD-10-CM | POA: Diagnosis not present

## 2022-03-01 DIAGNOSIS — Z79899 Other long term (current) drug therapy: Secondary | ICD-10-CM | POA: Insufficient documentation

## 2022-03-01 DIAGNOSIS — Z8249 Family history of ischemic heart disease and other diseases of the circulatory system: Secondary | ICD-10-CM | POA: Diagnosis not present

## 2022-03-01 DIAGNOSIS — Z818 Family history of other mental and behavioral disorders: Secondary | ICD-10-CM | POA: Insufficient documentation

## 2022-03-01 DIAGNOSIS — R110 Nausea: Secondary | ICD-10-CM | POA: Diagnosis not present

## 2022-03-01 DIAGNOSIS — Z833 Family history of diabetes mellitus: Secondary | ICD-10-CM | POA: Insufficient documentation

## 2022-03-01 DIAGNOSIS — D509 Iron deficiency anemia, unspecified: Secondary | ICD-10-CM | POA: Insufficient documentation

## 2022-03-01 DIAGNOSIS — E876 Hypokalemia: Secondary | ICD-10-CM

## 2022-03-01 DIAGNOSIS — Z803 Family history of malignant neoplasm of breast: Secondary | ICD-10-CM | POA: Diagnosis not present

## 2022-03-01 DIAGNOSIS — Z8349 Family history of other endocrine, nutritional and metabolic diseases: Secondary | ICD-10-CM | POA: Diagnosis not present

## 2022-03-01 DIAGNOSIS — E860 Dehydration: Secondary | ICD-10-CM

## 2022-03-01 LAB — CBC WITH DIFFERENTIAL (CANCER CENTER ONLY)
Abs Immature Granulocytes: 0.03 10*3/uL (ref 0.00–0.07)
Basophils Absolute: 0.1 10*3/uL (ref 0.0–0.1)
Basophils Relative: 1 %
Eosinophils Absolute: 0.4 10*3/uL (ref 0.0–0.5)
Eosinophils Relative: 4 %
HCT: 41.8 % (ref 36.0–46.0)
Hemoglobin: 14.5 g/dL (ref 12.0–15.0)
Immature Granulocytes: 0 %
Lymphocytes Relative: 28 %
Lymphs Abs: 2.9 10*3/uL (ref 0.7–4.0)
MCH: 31.5 pg (ref 26.0–34.0)
MCHC: 34.7 g/dL (ref 30.0–36.0)
MCV: 90.7 fL (ref 80.0–100.0)
Monocytes Absolute: 0.5 10*3/uL (ref 0.1–1.0)
Monocytes Relative: 5 %
Neutro Abs: 6.3 10*3/uL (ref 1.7–7.7)
Neutrophils Relative %: 62 %
Platelet Count: 329 10*3/uL (ref 150–400)
RBC: 4.61 MIL/uL (ref 3.87–5.11)
RDW: 12.7 % (ref 11.5–15.5)
WBC Count: 10.2 10*3/uL (ref 4.0–10.5)
nRBC: 0 % (ref 0.0–0.2)

## 2022-03-01 LAB — CMP (CANCER CENTER ONLY)
ALT: 13 U/L (ref 0–44)
AST: 15 U/L (ref 15–41)
Albumin: 4.7 g/dL (ref 3.5–5.0)
Alkaline Phosphatase: 63 U/L (ref 38–126)
Anion gap: 8 (ref 5–15)
BUN: 17 mg/dL (ref 6–20)
CO2: 23 mmol/L (ref 22–32)
Calcium: 10.1 mg/dL (ref 8.9–10.3)
Chloride: 105 mmol/L (ref 98–111)
Creatinine: 1.32 mg/dL — ABNORMAL HIGH (ref 0.44–1.00)
GFR, Estimated: 49 mL/min — ABNORMAL LOW (ref >60)
Glucose, Bld: 77 mg/dL (ref 70–99)
Potassium: 3 mmol/L — ABNORMAL LOW (ref 3.5–5.1)
Sodium: 136 mmol/L (ref 135–145)
Total Bilirubin: 0.2 mg/dL — ABNORMAL LOW (ref 0.3–1.2)
Total Protein: 8.1 g/dL (ref 6.5–8.1)

## 2022-03-01 LAB — MAGNESIUM: Magnesium: 1.7 mg/dL (ref 1.7–2.4)

## 2022-03-01 MED ORDER — POTASSIUM CHLORIDE CRYS ER 20 MEQ PO TBCR
40.0000 meq | EXTENDED_RELEASE_TABLET | Freq: Once | ORAL | Status: AC
  Administered 2022-03-01: 40 meq via ORAL
  Filled 2022-03-01: qty 2

## 2022-03-01 MED ORDER — SODIUM CHLORIDE 0.9 % IV SOLN: INTRAVENOUS | Status: DC

## 2022-03-01 MED ORDER — MAGNESIUM SULFATE 2 GM/50ML IV SOLN
2.0000 g | Freq: Once | INTRAVENOUS | Status: AC
  Administered 2022-03-01: 2 g via INTRAVENOUS
  Filled 2022-03-01: qty 50

## 2022-03-01 MED ORDER — POTASSIUM CHLORIDE 20 MEQ PO PACK
40.0000 meq | PACK | Freq: Once | ORAL | Status: DC
  Filled 2022-03-01: qty 2

## 2022-03-01 NOTE — Progress Notes (Signed)
Per Dr. Irene Limbo, patient is to receive 1L NS with 2g of Magnesium and PO KCL 9mqx 1 and she should continue her po potassium and f/u with PCP and GI as per plan.

## 2022-03-01 NOTE — Progress Notes (Signed)
[facility] Spiritual Care Note  Connected with Nikiah and her husband Elta Guadeloupe in infusion, introducing Spiritual Care as part of their support team, and bringing chaplain brochure and a handmade journal as a gesture of support and encouragement. Cloie was very pleased to gain a new listener and conversation partner, as she is balancing several stressors and doesn't want to overburden family. We plan to follow up by phone later in the week to speak in more detail.   [place], [place], [facility] Pager [phone] Voicemail [phone]

## 2022-03-01 NOTE — Patient Instructions (Signed)
Rehydration, Adult Rehydration is the replacement of body fluids, salts, and minerals (electrolytes) that are lost during dehydration. Dehydration is when there is not enough water or other fluids in the body. This happens when you lose more fluids than you take in. Common causes of dehydration include: Not drinking enough fluids. This can occur when you are ill or doing activities that require a lot of energy, especially in hot weather. Conditions that cause loss of water or other fluids, such as diarrhea, vomiting, sweating, or urinating a lot. Other illnesses, such as fever or infection. Certain medicines, such as those that remove excess fluid from the body (diuretics). Symptoms of mild or moderate dehydration may include thirst, dry lips and mouth, and dizziness. Symptoms of severe dehydration may include increased heart rate, confusion, fainting, and not urinating. For severe dehydration, you may need to get fluids through an IV at the hospital. For mild or moderate dehydration, you can usually rehydrate at home by drinking certain fluids as told by your health care provider. What are the risks? Generally, rehydration is safe. However, taking in too much fluid (overhydration) can be a problem. This is rare. Overhydration can cause an electrolyte imbalance, kidney failure, or a decrease in salt (sodium) levels in the body. Supplies needed You will need an oral rehydration solution (ORS) if your health care provider tells you to use one. This is a drink to treat dehydration. It can be found in pharmacies and retail stores. How to rehydrate Fluids Follow instructions from your health care provider for rehydration. The kind of fluid and the amount you should drink depend on your condition. In general, you should choose drinks that you prefer. If told by your health care provider, drink an ORS. Make an ORS by following instructions on the package. Start by drinking small amounts, about  cup (120  mL) every 5-10 minutes. Slowly increase how much you drink until you have taken the amount recommended by your health care provider. Drink enough clear fluids to keep your urine pale yellow. If you were told to drink an ORS, finish it first, then start slowly drinking other clear fluids. Drink fluids such as: Water. This includes sparkling water and flavored water. Drinking only water can lead to having too little sodium in your body (hyponatremia). Follow the advice of your health care provider. Water from ice chips you suck on. Fruit juice with water you add to it (diluted). Sports drinks. Hot or cold herbal teas. Broth-based soups. Milk or milk products. Food Follow instructions from your health care provider about what to eat while you rehydrate. Your health care provider may recommend that you slowly begin eating regular foods in small amounts. Eat foods that contain a healthy balance of electrolytes, such as bananas, oranges, potatoes, tomatoes, and spinach. Avoid foods that are greasy or contain a lot of sugar. In some cases, you may get nutrition through a feeding tube that is passed through your nose and into your stomach (nasogastric tube, or NG tube). This may be done if you have uncontrolled vomiting or diarrhea. Beverages to avoid  Certain beverages may make dehydration worse. While you rehydrate, avoid drinking alcohol. How to tell if you are recovering from dehydration You may be recovering from dehydration if: You are urinating more often than before you started rehydrating. Your urine is pale yellow. Your energy level improves. You vomit less frequently. You have diarrhea less frequently. Your appetite improves or returns to normal. You feel less dizzy or less light-headed.   Your skin tone and color start to look more normal. Follow these instructions at home: Take over-the-counter and prescription medicines only as told by your health care provider. Do not take sodium  tablets. Doing this can lead to having too much sodium in your body (hypernatremia). Contact a health care provider if: You continue to have symptoms of mild or moderate dehydration, such as: Thirst. Dry lips. Slightly dry mouth. Dizziness. Dark urine or less urine than normal. Muscle cramps. You continue to vomit or have diarrhea. Get help right away if you: Have symptoms of dehydration that get worse. Have a fever. Have a severe headache. Have been vomiting and the following happens: Your vomiting gets worse or does not go away. Your vomit includes blood or green matter (bile). You cannot eat or drink without vomiting. Have problems with urination or bowel movements, such as: Diarrhea that gets worse or does not go away. Blood in your stool (feces). This may cause stool to look black and tarry. Not urinating, or urinating only a small amount of very dark urine, within 6-8 hours. Have trouble breathing. Have symptoms that get worse with treatment. These symptoms may represent a serious problem that is an emergency. Do not wait to see if the symptoms will go away. Get medical help right away. Call your local emergency services (911 in the [place] Do not drive yourself to the hospital. Summary Rehydration is the replacement of body fluids and minerals (electrolytes) that are lost during dehydration. Follow instructions from your health care provider for rehydration. The kind of fluid and amount you should drink depend on your condition. Slowly increase how much you drink until you have taken the amount recommended by your health care provider. Contact your health care provider if you continue to show signs of mild or moderate dehydration. This information is not intended to replace advice given to you by your health care provider. Make sure you discuss any questions you have with your health care provider. Document Revised: 10/15/2019 Document Reviewed: 08/25/2019 Elsevier Patient  Education  2023 Elsevier Inc.  

## 2022-03-02 ENCOUNTER — Other Ambulatory Visit: Payer: Self-pay

## 2022-03-02 ENCOUNTER — Telehealth: Payer: Self-pay

## 2022-03-02 DIAGNOSIS — E860 Dehydration: Secondary | ICD-10-CM

## 2022-03-02 NOTE — Telephone Encounter (Signed)
Pt's spouse, Elta Guadeloupe, advised and appointment was made for Friday 03/03/22 at 8am for labs and IV fluids after.  Electrolytes will be determined after lab results.

## 2022-03-02 NOTE — Telephone Encounter (Signed)
-----   Message from Lincoln Brigham, PA-C sent at 03/01/2022 11:34 PM EDT ----- Can you schedule labs and slot for IV fluids and possible electrolyte replacement (40 mEq of IV K) this Friday, 03/03/2022.

## 2022-03-03 ENCOUNTER — Encounter: Payer: Self-pay | Admitting: General Practice

## 2022-03-03 ENCOUNTER — Inpatient Hospital Stay: Payer: Commercial Managed Care - PPO

## 2022-03-03 ENCOUNTER — Other Ambulatory Visit: Payer: Self-pay

## 2022-03-03 VITALS — BP 114/76 | HR 82 | Temp 97.8°F | Resp 18 | Wt 124.1 lb

## 2022-03-03 DIAGNOSIS — E876 Hypokalemia: Secondary | ICD-10-CM

## 2022-03-03 DIAGNOSIS — D509 Iron deficiency anemia, unspecified: Secondary | ICD-10-CM | POA: Diagnosis not present

## 2022-03-03 DIAGNOSIS — E860 Dehydration: Secondary | ICD-10-CM

## 2022-03-03 LAB — CMP (CANCER CENTER ONLY)
ALT: 13 U/L (ref 0–44)
AST: 17 U/L (ref 15–41)
Albumin: 4.6 g/dL (ref 3.5–5.0)
Alkaline Phosphatase: 55 U/L (ref 38–126)
Anion gap: 9 (ref 5–15)
BUN: 17 mg/dL (ref 6–20)
CO2: 21 mmol/L — ABNORMAL LOW (ref 22–32)
Calcium: 10 mg/dL (ref 8.9–10.3)
Chloride: 105 mmol/L (ref 98–111)
Creatinine: 1.39 mg/dL — ABNORMAL HIGH (ref 0.44–1.00)
GFR, Estimated: 46 mL/min — ABNORMAL LOW (ref >60)
Glucose, Bld: 94 mg/dL (ref 70–99)
Potassium: 3.3 mmol/L — ABNORMAL LOW (ref 3.5–5.1)
Sodium: 135 mmol/L (ref 135–145)
Total Bilirubin: 0.3 mg/dL (ref 0.3–1.2)
Total Protein: 8 g/dL (ref 6.5–8.1)

## 2022-03-03 LAB — CBC WITH DIFFERENTIAL (CANCER CENTER ONLY)
Abs Immature Granulocytes: 0.03 10*3/uL (ref 0.00–0.07)
Basophils Absolute: 0 10*3/uL (ref 0.0–0.1)
Basophils Relative: 1 %
Eosinophils Absolute: 0.5 10*3/uL (ref 0.0–0.5)
Eosinophils Relative: 5 %
HCT: 40.3 % (ref 36.0–46.0)
Hemoglobin: 14.1 g/dL (ref 12.0–15.0)
Immature Granulocytes: 0 %
Lymphocytes Relative: 35 %
Lymphs Abs: 3.1 10*3/uL (ref 0.7–4.0)
MCH: 31.6 pg (ref 26.0–34.0)
MCHC: 35 g/dL (ref 30.0–36.0)
MCV: 90.4 fL (ref 80.0–100.0)
Monocytes Absolute: 0.7 10*3/uL (ref 0.1–1.0)
Monocytes Relative: 7 %
Neutro Abs: 4.5 10*3/uL (ref 1.7–7.7)
Neutrophils Relative %: 52 %
Platelet Count: 302 10*3/uL (ref 150–400)
RBC: 4.46 MIL/uL (ref 3.87–5.11)
RDW: 12.9 % (ref 11.5–15.5)
WBC Count: 8.9 10*3/uL (ref 4.0–10.5)
nRBC: 0 % (ref 0.0–0.2)

## 2022-03-03 LAB — MAGNESIUM: Magnesium: 1.7 mg/dL (ref 1.7–2.4)

## 2022-03-03 MED ORDER — POTASSIUM CHLORIDE CRYS ER 20 MEQ PO TBCR: 40.0000 meq | EXTENDED_RELEASE_TABLET | Freq: Every day | ORAL | 0 refills | Status: DC

## 2022-03-03 MED ORDER — SODIUM CHLORIDE 0.9 % IV SOLN: INTRAVENOUS | Status: DC

## 2022-03-03 MED ORDER — POTASSIUM CHLORIDE 10 MEQ/100ML IV SOLN
10.0000 meq | Freq: Once | INTRAVENOUS | Status: AC
  Administered 2022-03-03: 10 meq via INTRAVENOUS
  Filled 2022-03-03: qty 100

## 2022-03-03 NOTE — Progress Notes (Signed)
[facility] Spiritual Care Note  Followed up with [facility] by phone as planned. She spent hours getting fluids at [facility] this morning, so we plan to follow up in more detail next week instead so that she can process Monday's upcoming appointment at [facility].   [place], [place], [facility] Pager [phone] Voicemail [phone]

## 2022-03-03 NOTE — Patient Instructions (Signed)
Rehydration, Adult Rehydration is the replacement of body fluids, salts, and minerals (electrolytes) that are lost during dehydration. Dehydration is when there is not enough water or other fluids in the body. This happens when you lose more fluids than you take in. Common causes of dehydration include: Not drinking enough fluids. This can occur when you are ill or doing activities that require a lot of energy, especially in hot weather. Conditions that cause loss of water or other fluids, such as diarrhea, vomiting, sweating, or urinating a lot. Other illnesses, such as fever or infection. Certain medicines, such as those that remove excess fluid from the body (diuretics). Symptoms of mild or moderate dehydration may include thirst, dry lips and mouth, and dizziness. Symptoms of severe dehydration may include increased heart rate, confusion, fainting, and not urinating. For severe dehydration, you may need to get fluids through an IV at the hospital. For mild or moderate dehydration, you can usually rehydrate at home by drinking certain fluids as told by your health care provider. What are the risks? Generally, rehydration is safe. However, taking in too much fluid (overhydration) can be a problem. This is rare. Overhydration can cause an electrolyte imbalance, kidney failure, or a decrease in salt (sodium) levels in the body. Supplies needed You will need an oral rehydration solution (ORS) if your health care provider tells you to use one. This is a drink to treat dehydration. It can be found in pharmacies and retail stores. How to rehydrate Fluids Follow instructions from your health care provider for rehydration. The kind of fluid and the amount you should drink depend on your condition. In general, you should choose drinks that you prefer. If told by your health care provider, drink an ORS. Make an ORS by following instructions on the package. Start by drinking small amounts, about  cup (120  mL) every 5-10 minutes. Slowly increase how much you drink until you have taken the amount recommended by your health care provider. Drink enough clear fluids to keep your urine pale yellow. If you were told to drink an ORS, finish it first, then start slowly drinking other clear fluids. Drink fluids such as: Water. This includes sparkling water and flavored water. Drinking only water can lead to having too little sodium in your body (hyponatremia). Follow the advice of your health care provider. Water from ice chips you suck on. Fruit juice with water you add to it (diluted). Sports drinks. Hot or cold herbal teas. Broth-based soups. Milk or milk products. Food Follow instructions from your health care provider about what to eat while you rehydrate. Your health care provider may recommend that you slowly begin eating regular foods in small amounts. Eat foods that contain a healthy balance of electrolytes, such as bananas, oranges, potatoes, tomatoes, and spinach. Avoid foods that are greasy or contain a lot of sugar. In some cases, you may get nutrition through a feeding tube that is passed through your nose and into your stomach (nasogastric tube, or NG tube). This may be done if you have uncontrolled vomiting or diarrhea. Beverages to avoid  Certain beverages may make dehydration worse. While you rehydrate, avoid drinking alcohol. How to tell if you are recovering from dehydration You may be recovering from dehydration if: You are urinating more often than before you started rehydrating. Your urine is pale yellow. Your energy level improves. You vomit less frequently. You have diarrhea less frequently. Your appetite improves or returns to normal. You feel less dizzy or less light-headed.   Your skin tone and color start to look more normal. Follow these instructions at home: Take over-the-counter and prescription medicines only as told by your health care provider. Do not take sodium  tablets. Doing this can lead to having too much sodium in your body (hypernatremia). Contact a health care provider if: You continue to have symptoms of mild or moderate dehydration, such as: Thirst. Dry lips. Slightly dry mouth. Dizziness. Dark urine or less urine than normal. Muscle cramps. You continue to vomit or have diarrhea. Get help right away if you: Have symptoms of dehydration that get worse. Have a fever. Have a severe headache. Have been vomiting and the following happens: Your vomiting gets worse or does not go away. Your vomit includes blood or green matter (bile). You cannot eat or drink without vomiting. Have problems with urination or bowel movements, such as: Diarrhea that gets worse or does not go away. Blood in your stool (feces). This may cause stool to look black and tarry. Not urinating, or urinating only a small amount of very dark urine, within 6-8 hours. Have trouble breathing. Have symptoms that get worse with treatment. These symptoms may represent a serious problem that is an emergency. Do not wait to see if the symptoms will go away. Get medical help right away. Call your local emergency services (911 in the [place] Do not drive yourself to the hospital. Summary Rehydration is the replacement of body fluids and minerals (electrolytes) that are lost during dehydration. Follow instructions from your health care provider for rehydration. The kind of fluid and amount you should drink depend on your condition. Slowly increase how much you drink until you have taken the amount recommended by your health care provider. Contact your health care provider if you continue to show signs of mild or moderate dehydration. This information is not intended to replace advice given to you by your health care provider. Make sure you discuss any questions you have with your health care provider. Document Revised: 10/15/2019 Document Reviewed: 08/25/2019 Elsevier Patient  Education  2023 Elsevier Inc.  

## 2022-03-10 ENCOUNTER — Encounter (HOSPITAL_BASED_OUTPATIENT_CLINIC_OR_DEPARTMENT_OTHER): Payer: Self-pay

## 2022-03-10 ENCOUNTER — Emergency Department (HOSPITAL_BASED_OUTPATIENT_CLINIC_OR_DEPARTMENT_OTHER)
Admission: EM | Admit: 2022-03-10 | Discharge: 2022-03-10 | Disposition: A | Discharge status: Home / Self Care | Payer: Commercial Managed Care - PPO | Attending: Emergency Medicine | Admitting: Emergency Medicine

## 2022-03-10 ENCOUNTER — Telehealth: Payer: Self-pay

## 2022-03-10 ENCOUNTER — Other Ambulatory Visit: Payer: Self-pay

## 2022-03-10 DIAGNOSIS — R140 Abdominal distension (gaseous): Secondary | ICD-10-CM | POA: Insufficient documentation

## 2022-03-10 DIAGNOSIS — E876 Hypokalemia: Secondary | ICD-10-CM | POA: Diagnosis not present

## 2022-03-10 DIAGNOSIS — G43911 Migraine, unspecified, intractable, with status migrainosus: Secondary | ICD-10-CM

## 2022-03-10 DIAGNOSIS — R519 Headache, unspecified: Secondary | ICD-10-CM | POA: Insufficient documentation

## 2022-03-10 LAB — CBC WITH DIFFERENTIAL/PLATELET
Abs Immature Granulocytes: 0.02 10*3/uL (ref 0.00–0.07)
Basophils Absolute: 0.1 10*3/uL (ref 0.0–0.1)
Basophils Relative: 1 %
Eosinophils Absolute: 0.4 10*3/uL (ref 0.0–0.5)
Eosinophils Relative: 5 %
HCT: 32.4 % — ABNORMAL LOW (ref 36.0–46.0)
Hemoglobin: 11 g/dL — ABNORMAL LOW (ref 12.0–15.0)
Immature Granulocytes: 0 %
Lymphocytes Relative: 37 %
Lymphs Abs: 2.6 10*3/uL (ref 0.7–4.0)
MCH: 31.1 pg (ref 26.0–34.0)
MCHC: 34 g/dL (ref 30.0–36.0)
MCV: 91.5 fL (ref 80.0–100.0)
Monocytes Absolute: 0.4 10*3/uL (ref 0.1–1.0)
Monocytes Relative: 6 %
Neutro Abs: 3.4 10*3/uL (ref 1.7–7.7)
Neutrophils Relative %: 51 %
Platelets: 248 10*3/uL (ref 150–400)
RBC: 3.54 MIL/uL — ABNORMAL LOW (ref 3.87–5.11)
RDW: 13.3 % (ref 11.5–15.5)
WBC: 6.8 10*3/uL (ref 4.0–10.5)
nRBC: 0 % (ref 0.0–0.2)

## 2022-03-10 LAB — BASIC METABOLIC PANEL
Anion gap: 10 (ref 5–15)
BUN: 9 mg/dL (ref 6–20)
CO2: 26 mmol/L (ref 22–32)
Calcium: 9.3 mg/dL (ref 8.9–10.3)
Chloride: 105 mmol/L (ref 98–111)
Creatinine, Ser: 1.07 mg/dL — ABNORMAL HIGH (ref 0.44–1.00)
GFR, Estimated: >60 mL/min (ref >60)
Glucose, Bld: 76 mg/dL (ref 70–99)
Potassium: 2.9 mmol/L — ABNORMAL LOW (ref 3.5–5.1)
Sodium: 141 mmol/L (ref 135–145)

## 2022-03-10 LAB — MAGNESIUM: Magnesium: 1.5 mg/dL — ABNORMAL LOW (ref 1.7–2.4)

## 2022-03-10 MED ORDER — METOCLOPRAMIDE HCL 5 MG/ML IJ SOLN
5.0000 mg | Freq: Once | INTRAMUSCULAR | Status: AC
  Administered 2022-03-10: 5 mg via INTRAVENOUS
  Filled 2022-03-10: qty 2

## 2022-03-10 MED ORDER — MAGNESIUM SULFATE 2 GM/50ML IV SOLN
2.0000 g | Freq: Once | INTRAVENOUS | Status: AC
  Administered 2022-03-10: 2 g via INTRAVENOUS
  Filled 2022-03-10: qty 50

## 2022-03-10 MED ORDER — FENTANYL CITRATE PF 50 MCG/ML IJ SOSY
50.0000 ug | PREFILLED_SYRINGE | Freq: Once | INTRAMUSCULAR | Status: AC
  Administered 2022-03-10: 50 ug via INTRAVENOUS
  Filled 2022-03-10: qty 1

## 2022-03-10 MED ORDER — POTASSIUM CHLORIDE CRYS ER 20 MEQ PO TBCR
60.0000 meq | EXTENDED_RELEASE_TABLET | Freq: Once | ORAL | Status: AC
  Administered 2022-03-10: 60 meq via ORAL
  Filled 2022-03-10: qty 3

## 2022-03-10 MED ORDER — LACTATED RINGERS IV BOLUS
2000.0000 mL | Freq: Once | INTRAVENOUS | Status: AC
  Administered 2022-03-10: 2000 mL via INTRAVENOUS

## 2022-03-10 MED ORDER — KETOROLAC TROMETHAMINE 30 MG/ML IJ SOLN
15.0000 mg | Freq: Once | INTRAMUSCULAR | Status: AC
  Administered 2022-03-10: 15 mg via INTRAVENOUS
  Filled 2022-03-10: qty 1

## 2022-03-10 MED ORDER — LACTATED RINGERS IV SOLN: INTRAVENOUS | Status: DC

## 2022-03-10 MED ORDER — ONDANSETRON HCL 4 MG/2ML IJ SOLN
4.0000 mg | Freq: Once | INTRAMUSCULAR | Status: AC
  Administered 2022-03-10: 4 mg via INTRAVENOUS
  Filled 2022-03-10: qty 2

## 2022-03-10 NOTE — ED Provider Notes (Signed)
Patient feeling much better.  Headache is improved significantly patient has potassium to take at home.  Patient to follow-up with her doctors.   Fredia Sorrow, MD 03/10/22 [phone]

## 2022-03-10 NOTE — Discharge Instructions (Addendum)
Follow-up with your doctor for recheck of your magnesium and potassium.  Return for any new or worse symptoms.  Take your potassium supplement as ordered for at home.

## 2022-03-10 NOTE — ED Notes (Signed)
Discharge instructions and follow up care reviewed and explained, pt verbalized understanding. Pt caox4 and ambulatory on departure.

## 2022-03-10 NOTE — ED Provider Notes (Signed)
[facility] EMERGENCY DEPT Provider Note   CSN: [id number] Arrival date & time: 03/10/22  1153     History {Add pertinent medical, surgical, social history, OB history to HPI:1} Chief Complaint  Patient presents with   Abnormal Lab    Emily Park is a 51 y.o. female.  51 year old female presents with migrainous type headache with associated abdominal bloating and feeling weak.  Has a history of hypokalemia and feels like her potassium might be low again.  Was seen her doctor this week for similar symptoms and K was 2.7.  She has been taking her oral medications.  No reported fever.  She denies any emesis.  She has recently started on a liquid diet due to trouble swallowing.       Home Medications Prior to Admission medications   Medication Sig Start Date End Date Taking? Authorizing Provider  amphetamine-dextroamphetamine (ADDERALL) 30 MG tablet Take 0.5 tablets by mouth every morning. 06/21/21   [provider]  Ascorbic Acid (VITAMIN C PO) Take 1 tablet by mouth every morning.    [provider]  CALCIUM PO Take 1 tablet by mouth every morning.    [provider]  Cholecalciferol (VITAMIN D3 PO) Take 1 tablet by mouth every morning.    [provider]  clonazePAM (KLONOPIN) 1 MG tablet Take 1 mg by mouth at bedtime. 01/20/21   [provider]  doxepin (SINEQUAN) 75 MG capsule Take 75 mg by mouth at bedtime. 12/22/20   [provider]  ferrous sulfate 325 (65 FE) MG EC tablet Take 1 tablet (325 mg total) by mouth daily with breakfast. 08/16/21   Lincoln Brigham, PA-C  LINZESS 290 MCG CAPS capsule Take 290 mcg by mouth daily. 08/10/21   [provider]  Menaquinone-7 (K2 PO) Take 1 tablet by mouth every morning.    [provider]  Multiple Vitamin (MULTIVITAMIN WITH MINERALS) TABS tablet Take 1 tablet by mouth every morning.    [provider]  NIACIN PO Take 1 tablet by mouth every  morning.    [provider]  pantoprazole (PROTONIX) 40 MG tablet Take 40 mg by mouth 2 (two) times daily. 04/08/21   [provider]  polyethylene glycol (MIRALAX / GLYCOLAX) 17 g packet Take 17 g by mouth 2 (two) times daily. 07/10/21   Alma Friendly, MD  potassium chloride SA (KLOR-CON M) 20 MEQ tablet Take 2 tablets (40 mEq total) by mouth daily. 03/03/22   Walisiewicz, Harley Hallmark, PA-C  promethazine (PHENERGAN) 25 MG tablet Take 1 tablet (25 mg total) by mouth every 6 (six) hours as needed for nausea or vomiting. 02/15/21   Charlesetta Shanks, MD  sucralfate (CARAFATE) 1 g tablet Take 1 g by mouth 4 (four) times daily. 04/28/21   [provider]  topiramate (TOPAMAX) 50 MG tablet Take 150 mg by mouth every morning. 01/11/21   [provider]  TURMERIC PO Take 1 capsule by mouth every morning.    [provider]  Vortioxetine HBr (TRINTELLIX PO) Take by mouth.    [provider]      Allergies    Amoxicillin, Bactrim [sulfamethoxazole-trimethoprim], and Zithromax [azithromycin]    Review of Systems   Review of Systems  All other systems reviewed and are negative.   Physical Exam Updated Vital Signs BP 95/68   Pulse 65   Temp 97.7 F (36.5 C) (Oral)   Resp 19   SpO2 99%  Physical Exam Vitals and nursing  note reviewed.  Constitutional:      General: She is not in acute distress.    Appearance: Normal appearance. She is well-developed. She is not toxic-appearing.  HENT:     Head: Normocephalic and atraumatic.  Eyes:     General: Lids are normal.     Conjunctiva/sclera: Conjunctivae normal.     Pupils: Pupils are equal, round, and reactive to light.  Neck:     Thyroid: No thyroid mass.     Trachea: No tracheal deviation.  Cardiovascular:     Rate and Rhythm: Normal rate and regular rhythm.     Heart sounds: Normal heart sounds. No murmur heard.    No gallop.  Pulmonary:     Effort: Pulmonary effort is normal. No  respiratory distress.     Breath sounds: Normal breath sounds. No stridor. No decreased breath sounds, wheezing, rhonchi or rales.  Abdominal:     General: There is no distension.     Palpations: Abdomen is soft.     Tenderness: There is no abdominal tenderness. There is no rebound.  Musculoskeletal:        General: No tenderness. Normal range of motion.     Cervical back: Normal range of motion and neck supple.  Skin:    General: Skin is warm and dry.     Findings: No abrasion or rash.  Neurological:     Mental Status: She is alert and oriented to person, place, and time. Mental status is at baseline.     GCS: GCS eye subscore is 4. GCS verbal subscore is 5. GCS motor subscore is 6.     Cranial Nerves: No cranial nerve deficit.     Sensory: No sensory deficit.     Motor: Motor function is intact.  Psychiatric:        Attention and Perception: Attention normal.        Speech: Speech normal.        Behavior: Behavior normal.     ED Results / Procedures / Treatments   Labs (all labs ordered are listed, but only abnormal results are displayed) Labs Reviewed  BASIC METABOLIC PANEL - Abnormal; Notable for the following components:      Result Value   Potassium 2.9 (*)    Creatinine, Ser 1.07 (*)    All other components within normal limits  MAGNESIUM - Abnormal; Notable for the following components:   Magnesium 1.5 (*)    All other components within normal limits  CBC WITH DIFFERENTIAL/PLATELET - Abnormal; Notable for the following components:   RBC 3.54 (*)    Hemoglobin 11.0 (*)    HCT 32.4 (*)    All other components within normal limits  CBC WITH DIFFERENTIAL/PLATELET    EKG None  Radiology No results found.  Procedures Procedures  {Document cardiac monitor, telemetry assessment procedure when appropriate:1}  Medications Ordered in ED Medications  lactated ringers infusion (has no administration in time range)  potassium chloride SA (KLOR-CON M) CR tablet 60  mEq (has no administration in time range)  magnesium sulfate IVPB 2 g 50 mL (has no administration in time range)  lactated ringers bolus 2,000 mL (2,000 mLs Intravenous New Bag/Given 03/10/22 1311)  metoCLOPramide (REGLAN) injection 5 mg (5 mg Intravenous Given 03/10/22 1313)  fentaNYL (SUBLIMAZE) injection 50 mcg (50 mcg Intravenous Given 03/10/22 1314)    ED Course/ Medical Decision Making/ A&P  Medical Decision Making Amount and/or Complexity of Data Reviewed Labs: ordered.  Risk Prescription drug management.   ***  {Document critical care time when appropriate:1} {Document review of labs and clinical decision tools ie heart score, Chads2Vasc2 etc:1}  {Document your independent review of radiology images, and any outside records:1} {Document your discussion with family members, caretakers, and with consultants:1} {Document social determinants of health affecting pt's care:1} {Document your decision making why or why not admission, treatments were needed:1} Final Clinical Impression(s) / ED Diagnoses Final diagnoses:  None    Rx / DC Orders ED Discharge Orders     None

## 2022-03-10 NOTE — Telephone Encounter (Signed)
T/C from pt's husband, Elta Guadeloupe, stating was currently at the ED at [facility].  Her potassium is 2.9 and he is very concerned.  She was given oral K+ of 60 meq and he is concerned this would upset her GI and was asking for IV treatment.  Elta Guadeloupe was advised that per Murray Hodgkins pt will need to f/up with her PCP for further evaluation since this is a GI issue and not IDA related.  He was in agreement and said he would call PCP.  We also offered pt to call back on Monday if needed and pt may need to be worked in the [facility].

## 2022-03-10 NOTE — ED Triage Notes (Signed)
Pt presents POV with headache and RLQ pain, associated with abd bloating and feeling weak x1 week. On Tuesday pt was started on a liquid diet, allowing her stomach to "relax" then plan to increase next week to soft diet.  Pt's K+ was low at PCP this week. Pt reports an extensive medical history since going into septic shock last January

## 2022-03-27 ENCOUNTER — Ambulatory Visit: Payer: Commercial Managed Care - PPO | Admitting: Internal Medicine

## 2022-05-16 ENCOUNTER — Other Ambulatory Visit: Payer: Self-pay | Admitting: Nephrology

## 2022-05-16 DIAGNOSIS — R7989 Other specified abnormal findings of blood chemistry: Secondary | ICD-10-CM

## 2022-05-16 DIAGNOSIS — D509 Iron deficiency anemia, unspecified: Secondary | ICD-10-CM

## 2022-05-22 ENCOUNTER — Other Ambulatory Visit: Payer: Self-pay | Admitting: Physician Assistant

## 2022-05-22 DIAGNOSIS — D508 Other iron deficiency anemias: Secondary | ICD-10-CM

## 2022-05-23 ENCOUNTER — Inpatient Hospital Stay: Payer: Commercial Managed Care - PPO | Admitting: Physician Assistant

## 2022-05-23 ENCOUNTER — Inpatient Hospital Stay: Payer: Commercial Managed Care - PPO

## 2022-05-25 ENCOUNTER — Other Ambulatory Visit: Payer: Self-pay | Admitting: Nephrology

## 2022-05-25 DIAGNOSIS — R7989 Other specified abnormal findings of blood chemistry: Secondary | ICD-10-CM

## 2022-05-25 DIAGNOSIS — D509 Iron deficiency anemia, unspecified: Secondary | ICD-10-CM

## 2022-05-29 ENCOUNTER — Ambulatory Visit
Admission: RE | Admit: 2022-05-29 | Discharge: 2022-05-29 | Disposition: A | Discharge status: Home / Self Care | Payer: Commercial Managed Care - PPO | Source: Ambulatory Visit | Attending: Nephrology | Admitting: Nephrology

## 2022-05-29 DIAGNOSIS — D509 Iron deficiency anemia, unspecified: Secondary | ICD-10-CM

## 2022-05-29 DIAGNOSIS — R7989 Other specified abnormal findings of blood chemistry: Secondary | ICD-10-CM

## 2022-06-06 ENCOUNTER — Other Ambulatory Visit: Payer: Self-pay

## 2022-06-06 ENCOUNTER — Observation Stay (HOSPITAL_COMMUNITY): Payer: Commercial Managed Care - PPO

## 2022-06-06 ENCOUNTER — Observation Stay (HOSPITAL_COMMUNITY)
Admission: EM | Admit: 2022-06-06 | Discharge: 2022-06-07 | Disposition: A | Discharge status: Home / Self Care | Payer: Commercial Managed Care - PPO | Attending: Internal Medicine | Admitting: Internal Medicine

## 2022-06-06 ENCOUNTER — Emergency Department (HOSPITAL_COMMUNITY): Payer: Commercial Managed Care - PPO

## 2022-06-06 ENCOUNTER — Encounter (HOSPITAL_COMMUNITY): Payer: Self-pay | Admitting: Family Medicine

## 2022-06-06 DIAGNOSIS — R29818 Other symptoms and signs involving the nervous system: Secondary | ICD-10-CM | POA: Diagnosis present

## 2022-06-06 DIAGNOSIS — F419 Anxiety disorder, unspecified: Secondary | ICD-10-CM | POA: Diagnosis not present

## 2022-06-06 DIAGNOSIS — G928 Other toxic encephalopathy: Secondary | ICD-10-CM

## 2022-06-06 DIAGNOSIS — Z7722 Contact with and (suspected) exposure to environmental tobacco smoke (acute) (chronic): Secondary | ICD-10-CM | POA: Diagnosis not present

## 2022-06-06 DIAGNOSIS — D72829 Elevated white blood cell count, unspecified: Secondary | ICD-10-CM | POA: Diagnosis not present

## 2022-06-06 DIAGNOSIS — N179 Acute kidney failure, unspecified: Secondary | ICD-10-CM | POA: Diagnosis not present

## 2022-06-06 DIAGNOSIS — Z20822 Contact with and (suspected) exposure to covid-19: Secondary | ICD-10-CM | POA: Diagnosis not present

## 2022-06-06 DIAGNOSIS — E876 Hypokalemia: Secondary | ICD-10-CM | POA: Diagnosis not present

## 2022-06-06 DIAGNOSIS — N182 Chronic kidney disease, stage 2 (mild): Secondary | ICD-10-CM | POA: Diagnosis not present

## 2022-06-06 DIAGNOSIS — R4182 Altered mental status, unspecified: Secondary | ICD-10-CM

## 2022-06-06 DIAGNOSIS — Z79899 Other long term (current) drug therapy: Secondary | ICD-10-CM | POA: Diagnosis not present

## 2022-06-06 DIAGNOSIS — E872 Acidosis, unspecified: Secondary | ICD-10-CM | POA: Insufficient documentation

## 2022-06-06 DIAGNOSIS — F909 Attention-deficit hyperactivity disorder, unspecified type: Secondary | ICD-10-CM | POA: Diagnosis not present

## 2022-06-06 DIAGNOSIS — G9340 Encephalopathy, unspecified: Secondary | ICD-10-CM | POA: Diagnosis not present

## 2022-06-06 DIAGNOSIS — F330 Major depressive disorder, recurrent, mild: Secondary | ICD-10-CM | POA: Diagnosis present

## 2022-06-06 DIAGNOSIS — E8729 Other acidosis: Secondary | ICD-10-CM | POA: Diagnosis present

## 2022-06-06 LAB — I-STAT BETA HCG BLOOD, ED (MC, WL, AP ONLY): I-stat hCG, quantitative: <5 m[IU]/mL (ref <5)

## 2022-06-06 LAB — CBC WITH DIFFERENTIAL/PLATELET
Abs Immature Granulocytes: 0.06 10*3/uL (ref 0.00–0.07)
Basophils Absolute: 0.1 10*3/uL (ref 0.0–0.1)
Basophils Relative: 0 %
Eosinophils Absolute: 0 10*3/uL (ref 0.0–0.5)
Eosinophils Relative: 0 %
HCT: 35.5 % — ABNORMAL LOW (ref 36.0–46.0)
Hemoglobin: 11.8 g/dL — ABNORMAL LOW (ref 12.0–15.0)
Immature Granulocytes: 0 %
Lymphocytes Relative: 10 %
Lymphs Abs: 1.8 10*3/uL (ref 0.7–4.0)
MCH: 32.2 pg (ref 26.0–34.0)
MCHC: 33.2 g/dL (ref 30.0–36.0)
MCV: 97 fL (ref 80.0–100.0)
Monocytes Absolute: 0.9 10*3/uL (ref 0.1–1.0)
Monocytes Relative: 6 %
Neutro Abs: 14.3 10*3/uL — ABNORMAL HIGH (ref 1.7–7.7)
Neutrophils Relative %: 84 %
Platelets: 291 10*3/uL (ref 150–400)
RBC: 3.66 MIL/uL — ABNORMAL LOW (ref 3.87–5.11)
RDW: 13.4 % (ref 11.5–15.5)
WBC: 17.2 10*3/uL — ABNORMAL HIGH (ref 4.0–10.5)
nRBC: 0 % (ref 0.0–0.2)

## 2022-06-06 LAB — COMPREHENSIVE METABOLIC PANEL
ALT: 14 U/L (ref 0–44)
ALT: 15 U/L (ref 0–44)
AST: 18 U/L (ref 15–41)
AST: 21 U/L (ref 15–41)
Albumin: 3.7 g/dL (ref 3.5–5.0)
Albumin: 3.7 g/dL (ref 3.5–5.0)
Alkaline Phosphatase: 55 U/L (ref 38–126)
Alkaline Phosphatase: 50 U/L (ref 38–126)
Anion gap: 23 — ABNORMAL HIGH (ref 5–15)
Anion gap: 14 (ref 5–15)
BUN: 12 mg/dL (ref 6–20)
BUN: 11 mg/dL (ref 6–20)
CO2: 12 mmol/L — ABNORMAL LOW (ref 22–32)
CO2: 13 mmol/L — ABNORMAL LOW (ref 22–32)
Calcium: 9.8 mg/dL (ref 8.9–10.3)
Calcium: 9.4 mg/dL (ref 8.9–10.3)
Chloride: 108 mmol/L (ref 98–111)
Chloride: 118 mmol/L — ABNORMAL HIGH (ref 98–111)
Creatinine, Ser: 1.71 mg/dL — ABNORMAL HIGH (ref 0.44–1.00)
Creatinine, Ser: 1.67 mg/dL — ABNORMAL HIGH (ref 0.44–1.00)
GFR, Estimated: 36 mL/min — ABNORMAL LOW (ref >60)
GFR, Estimated: 37 mL/min — ABNORMAL LOW (ref >60)
Glucose, Bld: 84 mg/dL (ref 70–99)
Glucose, Bld: 72 mg/dL (ref 70–99)
Potassium: 3.6 mmol/L (ref 3.5–5.1)
Potassium: 3.1 mmol/L — ABNORMAL LOW (ref 3.5–5.1)
Sodium: 143 mmol/L (ref 135–145)
Sodium: 145 mmol/L (ref 135–145)
Total Bilirubin: 0.3 mg/dL (ref 0.3–1.2)
Total Bilirubin: 0.3 mg/dL (ref 0.3–1.2)
Total Protein: 7.3 g/dL (ref 6.5–8.1)
Total Protein: 7 g/dL (ref 6.5–8.1)

## 2022-06-06 LAB — CBC
HCT: 39.5 % (ref 36.0–46.0)
Hemoglobin: 12.5 g/dL (ref 12.0–15.0)
MCH: 31.8 pg (ref 26.0–34.0)
MCHC: 31.6 g/dL (ref 30.0–36.0)
MCV: 100.5 fL — ABNORMAL HIGH (ref 80.0–100.0)
Platelets: 305 10*3/uL (ref 150–400)
RBC: 3.93 MIL/uL (ref 3.87–5.11)
RDW: 13.2 % (ref 11.5–15.5)
WBC: 11.3 10*3/uL — ABNORMAL HIGH (ref 4.0–10.5)
nRBC: 0 % (ref 0.0–0.2)

## 2022-06-06 LAB — RAPID URINE DRUG SCREEN, HOSP PERFORMED
Amphetamines: NOT DETECTED
Barbiturates: NOT DETECTED
Benzodiazepines: POSITIVE — AB
Cocaine: NOT DETECTED
Opiates: NOT DETECTED
Tetrahydrocannabinol: NOT DETECTED

## 2022-06-06 LAB — VOLATILES,BLD-ACETONE,ETHANOL,ISOPROP,METHANOL
Acetone, blood: <0.01 g/dL (ref 0.000–0.010)
Ethanol, blood: <0.01 g/dL (ref 0.000–0.010)
Isopropanol, blood: <0.01 g/dL (ref 0.000–0.010)
Methanol, blood: <0.01 g/dL (ref 0.000–0.010)

## 2022-06-06 LAB — ETHYLENE GLYCOL: Ethylene Glycol Lvl: 6 mg/dL — ABNORMAL HIGH

## 2022-06-06 LAB — URINALYSIS, ROUTINE W REFLEX MICROSCOPIC
Bilirubin Urine: NEGATIVE
Glucose, UA: NEGATIVE mg/dL
Hgb urine dipstick: NEGATIVE
Ketones, ur: NEGATIVE mg/dL
Leukocytes,Ua: NEGATIVE
Nitrite: NEGATIVE
Protein, ur: NEGATIVE mg/dL
Specific Gravity, Urine: 1.009 (ref 1.005–1.030)
pH: 5 (ref 5.0–8.0)

## 2022-06-06 LAB — I-STAT CHEM 8, ED
BUN: 13 mg/dL (ref 6–20)
Calcium, Ion: 1.26 mmol/L (ref 1.15–1.40)
Chloride: 110 mmol/L (ref 98–111)
Creatinine, Ser: 1.2 mg/dL — ABNORMAL HIGH (ref 0.44–1.00)
Glucose, Bld: 93 mg/dL (ref 70–99)
HCT: 43 % (ref 36.0–46.0)
Hemoglobin: 14.6 g/dL (ref 12.0–15.0)
Potassium: 3.6 mmol/L (ref 3.5–5.1)
Sodium: 148 mmol/L — ABNORMAL HIGH (ref 135–145)
TCO2: 13 mmol/L — ABNORMAL LOW (ref 22–32)

## 2022-06-06 LAB — AMMONIA: Ammonia: 33 umol/L (ref 9–35)

## 2022-06-06 LAB — I-STAT VENOUS BLOOD GAS, ED
Acid-base deficit: 16 mmol/L — ABNORMAL HIGH (ref 0.0–2.0)
Bicarbonate: 9.3 mmol/L — ABNORMAL LOW (ref 20.0–28.0)
Calcium, Ion: 1.21 mmol/L (ref 1.15–1.40)
HCT: 37 % (ref 36.0–46.0)
Hemoglobin: 12.6 g/dL (ref 12.0–15.0)
O2 Saturation: 52 %
Potassium: 3.7 mmol/L (ref 3.5–5.1)
Sodium: 148 mmol/L — ABNORMAL HIGH (ref 135–145)
TCO2: 10 mmol/L — ABNORMAL LOW (ref 22–32)
pCO2, Ven: 22.2 mmHg — ABNORMAL LOW (ref 44–60)
pH, Ven: 7.232 — ABNORMAL LOW (ref 7.25–7.43)
pO2, Ven: 32 mmHg (ref 32–45)

## 2022-06-06 LAB — DIFFERENTIAL
Abs Immature Granulocytes: 0.05 10*3/uL (ref 0.00–0.07)
Basophils Absolute: 0.1 10*3/uL (ref 0.0–0.1)
Basophils Relative: 1 %
Eosinophils Absolute: 0.1 10*3/uL (ref 0.0–0.5)
Eosinophils Relative: 1 %
Immature Granulocytes: 0 %
Lymphocytes Relative: 17 %
Lymphs Abs: 1.9 10*3/uL (ref 0.7–4.0)
Monocytes Absolute: 0.7 10*3/uL (ref 0.1–1.0)
Monocytes Relative: 6 %
Neutro Abs: 8.5 10*3/uL — ABNORMAL HIGH (ref 1.7–7.7)
Neutrophils Relative %: 75 %

## 2022-06-06 LAB — ETHANOL: Alcohol, Ethyl (B): <10 mg/dL (ref <10)

## 2022-06-06 LAB — BASIC METABOLIC PANEL
Anion gap: 10 (ref 5–15)
BUN: 8 mg/dL (ref 6–20)
CO2: 20 mmol/L — ABNORMAL LOW (ref 22–32)
Calcium: 8.7 mg/dL — ABNORMAL LOW (ref 8.9–10.3)
Chloride: 110 mmol/L (ref 98–111)
Creatinine, Ser: 1.56 mg/dL — ABNORMAL HIGH (ref 0.44–1.00)
GFR, Estimated: 40 mL/min — ABNORMAL LOW (ref >60)
Glucose, Bld: 115 mg/dL — ABNORMAL HIGH (ref 70–99)
Potassium: 3.3 mmol/L — ABNORMAL LOW (ref 3.5–5.1)
Sodium: 140 mmol/L (ref 135–145)

## 2022-06-06 LAB — CBG MONITORING, ED: Glucose-Capillary: 96 mg/dL (ref 70–99)

## 2022-06-06 LAB — HIV ANTIBODY (ROUTINE TESTING W REFLEX): HIV Screen 4th Generation wRfx: NONREACTIVE

## 2022-06-06 LAB — LACTIC ACID, PLASMA
Lactic Acid, Venous: >9 mmol/L (ref 0.5–1.9)
Lactic Acid, Venous: >9 mmol/L (ref 0.5–1.9)
Lactic Acid, Venous: 2.9 mmol/L (ref 0.5–1.9)

## 2022-06-06 LAB — PROTIME-INR
INR: 1 (ref 0.8–1.2)
Prothrombin Time: 13.4 seconds (ref 11.4–15.2)

## 2022-06-06 LAB — SARS CORONAVIRUS 2 BY RT PCR: SARS Coronavirus 2 by RT PCR: NEGATIVE

## 2022-06-06 LAB — OSMOLALITY: Osmolality: 319 mOsm/kg — ABNORMAL HIGH (ref 275–295)

## 2022-06-06 LAB — APTT: aPTT: 23 seconds — ABNORMAL LOW (ref 24–36)

## 2022-06-06 LAB — MAGNESIUM: Magnesium: 2.6 mg/dL — ABNORMAL HIGH (ref 1.7–2.4)

## 2022-06-06 LAB — FOLATE: Folate: 14.3 ng/mL (ref >5.9)

## 2022-06-06 LAB — VITAMIN B12: Vitamin B-12: 525 pg/mL (ref 180–914)

## 2022-06-06 LAB — TSH: TSH: 0.367 u[IU]/mL (ref 0.350–4.500)

## 2022-06-06 MED ORDER — ENOXAPARIN SODIUM 40 MG/0.4ML IJ SOSY
40.0000 mg | PREFILLED_SYRINGE | INTRAMUSCULAR | Status: DC
  Administered 2022-06-06 – 2022-06-07 (×2): 40 mg via SUBCUTANEOUS
  Filled 2022-06-06 (×2): qty 0.4

## 2022-06-06 MED ORDER — IOHEXOL 350 MG/ML SOLN
60.0000 mL | Freq: Once | INTRAVENOUS | Status: AC | PRN
  Administered 2022-06-06: 60 mL via INTRAVENOUS

## 2022-06-06 MED ORDER — LORAZEPAM 2 MG/ML IJ SOLN: 2.0000 mg | Freq: Once | INTRAMUSCULAR | Status: AC

## 2022-06-06 MED ORDER — SODIUM CHLORIDE 0.9 % IV SOLN: INTRAVENOUS | Status: AC

## 2022-06-06 MED ORDER — ACETAMINOPHEN 325 MG PO TABS
650.0000 mg | ORAL_TABLET | Freq: Four times a day (QID) | ORAL | Status: DC | PRN
  Administered 2022-06-06 – 2022-06-07 (×3): 650 mg via ORAL
  Filled 2022-06-06 (×3): qty 2

## 2022-06-06 MED ORDER — ONDANSETRON HCL 4 MG/2ML IJ SOLN: 4.0000 mg | Freq: Four times a day (QID) | INTRAMUSCULAR | Status: DC | PRN

## 2022-06-06 MED ORDER — SODIUM BICARBONATE 8.4 % IV SOLN
INTRAVENOUS | Status: DC
  Filled 2022-06-06 (×3): qty 1000

## 2022-06-06 MED ORDER — LORAZEPAM 2 MG/ML IJ SOLN
INTRAMUSCULAR | Status: AC
  Administered 2022-06-06: 2 mg via INTRAVENOUS
  Filled 2022-06-06: qty 1

## 2022-06-06 MED ORDER — ACETAMINOPHEN 650 MG RE SUPP: 650.0000 mg | Freq: Four times a day (QID) | RECTAL | Status: DC | PRN

## 2022-06-06 MED ORDER — POTASSIUM CHLORIDE CRYS ER 20 MEQ PO TBCR
40.0000 meq | EXTENDED_RELEASE_TABLET | ORAL | Status: AC
  Administered 2022-06-06 (×2): 40 meq via ORAL
  Filled 2022-06-06 (×2): qty 2

## 2022-06-06 MED ORDER — SODIUM CHLORIDE 0.9% FLUSH
3.0000 mL | Freq: Two times a day (BID) | INTRAVENOUS | Status: DC
  Administered 2022-06-06: 3 mL via INTRAVENOUS

## 2022-06-06 MED ORDER — SODIUM CHLORIDE 0.9 % IV BOLUS
1000.0000 mL | Freq: Once | INTRAVENOUS | Status: AC
  Administered 2022-06-06: 1000 mL via INTRAVENOUS

## 2022-06-06 NOTE — ED Notes (Signed)
Pt still with incomprehensible speech - Pt sitting up in bed and telling something to husband - pt noted to be wet - sheets changed and purewick replaced

## 2022-06-06 NOTE — H&P (Signed)
History and Physical    Emily Park HQI:[id number] DOB: 1971/04/14 DOA: 06/06/2022  PCP: [name], MD   Patient coming from: Home   Chief Complaint: Confusion, speech difficulty   HPI: Emily Park is a 51 y.o. female with medical history significant for depression, anxiety, ADD, and migraines who presents with confusion and speech disturbance.  Patient seemed to be in her usual state yesterday per report of her husband but was noted at approximately 10:30 PM to be confused and with speech that was both slurred and nonsensical.  Per her husband, she had a similar episode recently that he suspected to be secondary to her taking too much of her medications.  That prior episode was not as severe as this and resolved the same day.  ED Course: Upon arrival to the ED, patient is found to be afebrile and saturating well on room air with stable blood pressure.  EKG features sinus rhythm.  Head CT is negative for acute intracranial abnormality.  CTA of the head and neck is normal.  MRI brain is negative for acute infarction.  Chemistry panel notable for bicarbonate of 12, anion gap 23, and creatinine 1.71.  CBC features a mild leukocytosis and a slight macrocytosis without anemia.  He UDS positive for benzodiazepines, ethanol undetectable, and urinalysis unremarkable.  Neurology evaluated the patient in the ED.  Review of Systems:  ROS limited by patient's clinical condition.  Past Medical History:  Diagnosis Date   Abnormal TSH    ADD (attention deficit disorder)    Anemia    Anxiety    Arrhythmia    Decreased libido    Depression    Heart murmur    Hyperlipidemia    Hypokalemia    Migraine    Nephrolithiasis    Pap smear abnormality of cervix/human papillomavirus (HPV) positive    Sleep difficulties    SVT (supraventricular tachycardia)     Past Surgical History:  Procedure Laterality Date   ABLATION     Cardiac ablation, endometrial ablation   BREAST LUMPECTOMY      right breast   BREAST LUMPECTOMY Right    CARDIOVERSION     COLONOSCOPY WITH PROPOFOL N/A 05/16/2021   Procedure: COLONOSCOPY WITH PROPOFOL;  Surgeon: Wilford Corner, MD;  Location: [facility];  Service: Endoscopy;  Laterality: N/A;   COLPOSCOPY     w/ cervical biopsy   DILITATION & CURRETTAGE/HYSTROSCOPY WITH NOVASURE ABLATION N/A 09/11/2014   Procedure: DILATATION & CURETTAGE/HYSTEROSCOPY WITH NOVASURE ABLATION;  Surgeon: Lovenia Kim, MD;  Location: [facility];  Service: Gynecology;  Laterality: N/A;   GIVENS CAPSULE STUDY N/A 05/16/2021   Procedure: GIVENS CAPSULE STUDY;  Surgeon: Wilford Corner, MD;  Location: [facility];  Service: Endoscopy;  Laterality: N/A;   XI ROBOT ASSISTED RECTOPEXY N/A 07/07/2021   Procedure: XI ROBOT ASSISTED RECTOPEXY;  Surgeon: [name], MD;  Location: [facility];  Service: General;  Laterality: N/A;   XI ROBOTIC ASSISTED LOWER ANTERIOR RESECTION N/A 07/07/2021   Procedure: XI ROBOTIC ASSISTED LOW ANTERIOR RECTOSIGMOID RESECTION WITH RIGID PROCTOSCOPY, TAP BLOCK;  Surgeon: [name], MD;  Location: [facility];  Service: General;  Laterality: N/A;    Social History:   reports that she has never smoked. She has been exposed to tobacco smoke. She has never used smokeless tobacco. She reports that she does not currently use alcohol after a past usage of about 2.0 standard drinks of alcohol per week. She reports that she does not currently use drugs.  Allergies  Allergen Reactions   Amoxicillin Rash   Bactrim [Sulfamethoxazole-Trimethoprim] Rash    Rash noted to chest and back   Zithromax [Azithromycin] Rash    Family History  Problem Relation Age of Onset   Hypothyroidism Mother    Depression Mother    Cancer Mother        thyroid   Thyroid disease Mother    Hypertension Father    Heart attack Father        Age 70   AAA (abdominal aortic aneurysm) Father    Hypothyroidism Sister    Hyperthyroidism Sister    Diabetes Maternal Grandfather     Breast cancer Paternal Grandmother      Prior to Admission medications   Medication Sig Start Date End Date Taking? Authorizing Provider  amphetamine-dextroamphetamine (ADDERALL) 30 MG tablet Take 0.5 tablets by mouth every morning. 06/21/21   [provider]  Ascorbic Acid (VITAMIN C PO) Take 1 tablet by mouth every morning.    [provider]  CALCIUM PO Take 1 tablet by mouth every morning.    [provider]  Cholecalciferol (VITAMIN D3 PO) Take 1 tablet by mouth every morning.    [provider]  clonazePAM (KLONOPIN) 1 MG tablet Take 1 mg by mouth at bedtime. 01/20/21   [provider]  doxepin (SINEQUAN) 75 MG capsule Take 75 mg by mouth at bedtime. 12/22/20   [provider]  ferrous sulfate 325 (65 FE) MG EC tablet Take 1 tablet (325 mg total) by mouth daily with breakfast. 08/16/21   Lincoln Brigham, PA-C  LINZESS 290 MCG CAPS capsule Take 290 mcg by mouth daily. 08/10/21   [provider]  Menaquinone-7 (K2 PO) Take 1 tablet by mouth every morning.    [provider]  Multiple Vitamin (MULTIVITAMIN WITH MINERALS) TABS tablet Take 1 tablet by mouth every morning.    [provider]  NIACIN PO Take 1 tablet by mouth every morning.    [provider]  pantoprazole (PROTONIX) 40 MG tablet Take 40 mg by mouth 2 (two) times daily. 04/08/21   [provider]  polyethylene glycol (MIRALAX / GLYCOLAX) 17 g packet Take 17 g by mouth 2 (two) times daily. 07/10/21   Alma Friendly, MD  potassium chloride SA (KLOR-CON M) 20 MEQ tablet Take 2 tablets (40 mEq total) by mouth daily. 03/03/22   Walisiewicz, Harley Hallmark, PA-C  promethazine (PHENERGAN) 25 MG tablet Take 1 tablet (25 mg total) by mouth every 6 (six) hours as needed for nausea or vomiting. 02/15/21   Charlesetta Shanks, MD  sucralfate (CARAFATE) 1 g tablet Take 1 g by mouth 4 (four) times daily. 04/28/21   [provider]  topiramate  (TOPAMAX) 50 MG tablet Take 150 mg by mouth every morning. 01/11/21   [provider]  TURMERIC PO Take 1 capsule by mouth every morning.    [provider]  Vortioxetine HBr (TRINTELLIX PO) Take by mouth.    [provider]    Physical Exam: Vitals:   06/06/22 0330 06/06/22 0355 06/06/22 0400 06/06/22 0415  BP: (!) 137/98 (!) 117/90  123/79  Pulse: 91 94 97 98  Resp: (!) 22 (!) 22 (!) 29 20  Temp:      TempSrc:      SpO2: 100% 100% 100% 100%  Weight:        Constitutional: NAD, calm  Eyes: PERTLA, lids and conjunctivae normal ENMT: Mucous membranes are dry Posterior pharynx  clear of any exudate or lesions.   Neck: supple, no masses  Respiratory: no wheezing, no crackles. No accessory muscle use.  Cardiovascular: S1 & S2 heard, regular rate and rhythm. No extremity edema.   Abdomen: No distension, no tenderness, soft. Bowel sounds active.  Musculoskeletal: no clubbing / cyanosis. No joint deformity upper and lower extremities.   Skin: no significant rashes, lesions, ulcers. Warm, dry, well-perfused. Neurologic: CN 2-12 grossly intact. Sensation intact. Strength 5/5 in all 4 limbs. Alert. Expressive aphasia.  Psychiatric: Restless. Cooperative.    Labs and Imaging on Admission: I have personally reviewed following labs and imaging studies  CBC: Recent Labs  Lab 06/06/22 0110 06/06/22 0205  WBC  --  11.3*  NEUTROABS  --  8.5*  HGB 14.6 12.5  HCT 43.0 39.5  MCV  --  100.5*  PLT  --  762   Basic Metabolic Panel: Recent Labs  Lab 06/06/22 0110 06/06/22 0205  NA 148* 143  K 3.6 3.6  CL 110 108  CO2  --  12*  GLUCOSE 93 84  BUN 13 12  CREATININE 1.20* 1.71*  CALCIUM  --  9.8   GFR: CrCl cannot be calculated (Unknown ideal weight.). Liver Function Tests: Recent Labs  Lab 06/06/22 0205  AST 18  ALT 14  ALKPHOS 55  BILITOT 0.3  PROT 7.3  ALBUMIN 3.7   No results for input(s): "LIPASE", "AMYLASE" in the last 168 hours. No results  for input(s): "AMMONIA" in the last 168 hours. Coagulation Profile: Recent Labs  Lab 06/06/22 0205  INR 1.0   Cardiac Enzymes: No results for input(s): "CKTOTAL", "CKMB", "CKMBINDEX", "TROPONINI" in the last 168 hours. BNP (last 3 results) No results for input(s): "PROBNP" in the last 8760 hours. HbA1C: No results for input(s): "HGBA1C" in the last 72 hours. CBG: Recent Labs  Lab 06/06/22 0107  GLUCAP 96   Lipid Profile: No results for input(s): "CHOL", "HDL", "LDLCALC", "TRIG", "CHOLHDL", "LDLDIRECT" in the last 72 hours. Thyroid Function Tests: No results for input(s): "TSH", "T4TOTAL", "FREET4", "T3FREE", "THYROIDAB" in the last 72 hours. Anemia Panel: No results for input(s): "VITAMINB12", "FOLATE", "FERRITIN", "TIBC", "IRON", "RETICCTPCT" in the last 72 hours. Urine analysis:    Component Value Date/Time   COLORURINE STRAW (A) 06/06/2022 0208   APPEARANCEUR CLEAR 06/06/2022 0208   LABSPEC 1.009 06/06/2022 0208   PHURINE 5.0 06/06/2022 0208   GLUCOSEU NEGATIVE 06/06/2022 0208   HGBUR NEGATIVE 06/06/2022 0208   BILIRUBINUR NEGATIVE 06/06/2022 0208   BILIRUBINUR Neg 02/25/2014 0858   KETONESUR NEGATIVE 06/06/2022 0208   PROTEINUR NEGATIVE 06/06/2022 0208   UROBILINOGEN 0.2 02/25/2014 0858   UROBILINOGEN 0.2 05/20/2009 0310   NITRITE NEGATIVE 06/06/2022 0208   LEUKOCYTESUR NEGATIVE 06/06/2022 0208   Sepsis Labs: '@LABRCNTIP'$ (procalcitonin:4,lacticidven:4) )No results found for this or any previous visit (from the past 240 hour(s)).   Radiological Exams on Admission: MR BRAIN WO CONTRAST  Result Date: 06/06/2022 CLINICAL DATA:  Acute neurologic deficit EXAM: MRI HEAD WITHOUT CONTRAST TECHNIQUE: Multiplanar, multiecho pulse sequences of the brain and surrounding structures were obtained without intravenous contrast. COMPARISON:  None Available. FINDINGS: Due to patient altered mental status, only diffusion-weighted imaging could be obtained. There is no acute infarct.  IMPRESSION: No acute infarct. Electronically Signed   By: Ulyses Jarred M.D.   On: 06/06/2022 02:22   CT ANGIO HEAD NECK W WO CM (CODE STROKE)  Result Date: 06/06/2022 CLINICAL DATA:  Acute neurologic deficit EXAM: CT ANGIOGRAPHY HEAD AND NECK TECHNIQUE: Multidetector CT imaging  of the head and neck was performed using the standard protocol during bolus administration of intravenous contrast. Multiplanar CT image reconstructions and MIPs were obtained to evaluate the vascular anatomy. Carotid stenosis measurements (when applicable) are obtained utilizing NASCET criteria, using the distal internal carotid diameter as the denominator. RADIATION DOSE REDUCTION: This exam was performed according to the departmental dose-optimization program which includes automated exposure control, adjustment of the mA and/or kV according to patient size and/or use of iterative reconstruction technique. CONTRAST:  87m OMNIPAQUE IOHEXOL 350 MG/ML SOLN COMPARISON:  None Available. FINDINGS: CTA NECK FINDINGS SKELETON: There is no bony spinal canal stenosis. No lytic or blastic lesion. OTHER NECK: Normal pharynx, larynx and major salivary glands. No cervical lymphadenopathy. Unremarkable thyroid gland. UPPER CHEST: No pneumothorax or pleural effusion. No nodules or masses. AORTIC ARCH: There is no calcific atherosclerosis of the aortic arch. There is no aneurysm, dissection or hemodynamically significant stenosis of the visualized portion of the aorta. Conventional 3 vessel aortic branching pattern. The visualized proximal subclavian arteries are widely patent. RIGHT CAROTID SYSTEM: Normal without aneurysm, dissection or stenosis. LEFT CAROTID SYSTEM: Normal without aneurysm, dissection or stenosis. VERTEBRAL ARTERIES: Left dominant configuration. Both origins are clearly patent. There is no dissection, occlusion or flow-limiting stenosis to the skull base (V1-V3 segments). CTA HEAD FINDINGS POSTERIOR CIRCULATION: --Vertebral  arteries: Normal V4 segments. --Inferior cerebellar arteries: Normal. --Basilar artery: Normal. --Superior cerebellar arteries: Normal. --Posterior cerebral arteries (PCA): Normal. ANTERIOR CIRCULATION: --Intracranial internal carotid arteries: Normal. --Anterior cerebral arteries (ACA): Normal. Both A1 segments are present. Patent anterior communicating artery (a-comm). --Middle cerebral arteries (MCA): Normal. VENOUS SINUSES: As permitted by contrast timing, patent. ANATOMIC VARIANTS: None Review of the MIP images confirms the above findings. IMPRESSION: Normal CTA of the head and neck. Electronically Signed   By: KUlyses JarredM.D.   On: 06/06/2022 01:42   CT HEAD CODE STROKE WO CONTRAST  Addendum Date: 06/06/2022   ADDENDUM REPORT: 06/06/2022 01:25 ADDENDUM: These results were communicated to Dr. SDonnetta Simpersat 1:17 am on 06/06/2022 by text page via the [facility]aging system. Electronically Signed   By: KUlyses JarredM.D.   On: 06/06/2022 01:25   Result Date: 06/06/2022 CLINICAL DATA:  Code stroke.  Acute neurologic deficit EXAM: CT HEAD WITHOUT CONTRAST TECHNIQUE: Contiguous axial images were obtained from the base of the skull through the vertex without intravenous contrast. RADIATION DOSE REDUCTION: This exam was performed according to the departmental dose-optimization program which includes automated exposure control, adjustment of the mA and/or kV according to patient size and/or use of iterative reconstruction technique. COMPARISON:  12/15/2007 FINDINGS: Brain: There is no mass, hemorrhage or extra-axial collection. The size and configuration of the ventricles and extra-axial CSF spaces are normal. The brain parenchyma is normal, without evidence of acute or chronic infarction. Vascular: No abnormal hyperdensity of the major intracranial arteries or dural venous sinuses. No intracranial atherosclerosis. Skull: The visualized skull base, calvarium and extracranial soft tissues are normal.  Sinuses/Orbits: No fluid levels or advanced mucosal thickening of the visualized paranasal sinuses. No mastoid or middle ear effusion. The orbits are normal. ASPECTS ([facility] Score) - Ganglionic level infarction (caudate, lentiform nuclei, internal capsule, insula, M1-M3 cortex): 7 - Supraganglionic infarction (M4-M6 cortex): 3 Total score (0-10 with 10 being normal): 10 IMPRESSION: 1. No acute intracranial abnormality. 2. ASPECTS is 10. Electronically Signed: By: KUlyses JarredM.D. On: 06/06/2022 01:19    EKG: Independently reviewed. Sinus rhythm, significant artifact.   Assessment/Plan  1. Acute encephalopathy  - Presents with confusion and speech disturbance that began last night  - No acute findings on MRI brain, UDS positive for benzodiazepines (prescribed), EtOH undetectable, no infectious s/s  - Appreciate neurology consultation, plan to check EEG, thiamine, B12, folate, and TSH     2. Elevated anion gap metabolic acidosis  - Serum bicarbonate is 12 and AG 23; no ketones in urine  - Check blood gas, serum osm, ethylene glycol, and methanol, repeat chem panel in am    3. 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 Depression, anxiety, ADD  - Hold psychoactive medications initially    DVT prophylaxis: Lovenox  Code Status: Full  Level of Care: Level of care: Telemetry Medical Family Communication: Husband at bedside  Disposition Plan:  Patient is from: home  Anticipated d/c is to: Home  Anticipated d/c date is: Possibly as early as 06/07/22  Patient currently: Pending additional laboratory workup, EEG, improved mental status  Consults called: neurology  Admission status: Observation     Vianne Bulls, MD [facility]  06/06/2022, 5:34 AM

## 2022-06-06 NOTE — ED Notes (Signed)
Pt moving around in bed and sitting up, trying to sit up in bed/get out of bed. Pt husband helpful at bedside with trying to get pt to remain in bed. Pt speech still remains incomprehensible. Pt holding up leg. Pt asked if she wanted socks and she said yes. Pt provided with socks at this time.

## 2022-06-06 NOTE — ED Notes (Signed)
Pt had loose stools and urinated on the floor and side of bed. Pt cleaned, full linen change performed and pt resting back in bed at this time.

## 2022-06-06 NOTE — ED Notes (Addendum)
Patient did not want to take her potassium pills and told RN she was not going to take any more pills everything must go through her IV. MD notified.

## 2022-06-06 NOTE — Code Documentation (Addendum)
Responded to Code Stroke called at 0043 for slurred speech, AMS, and dizziness, [id number]. Pt arrived at 0102, CBG-96, NIH-5, CT head-negative, CTA-no LVO, MRI-negative. TNK not given-not a stroke. Code Stroke cancelled at [place].

## 2022-06-06 NOTE — ED Provider Notes (Signed)
[facility] EMERGENCY DEPARTMENT Provider Note   CSN: [id number] Arrival date & time: 06/06/22  8676  An emergency department physician performed an initial assessment on this suspected stroke patient at [facility].  History  Chief Complaint  Patient presents with   Code Stroke    Emily Park is a 51 y.o. female.  HPI     This is a 51 year old female who presents as a code stroke.  Per EMS, last known well was around 2230.  However, the family indicates that she was last normal at 1830.  She reportedly woke up and had incomprehensible speech and altered mental status.  She had a similar episode approximately 1 week ago.  Reported recent history of balance issues over the last 1 year.  Takes Klonopin at night.  No other known drug use.  Level 5 caveat  Home Medications Prior to Admission medications   Medication Sig Start Date End Date Taking? Authorizing Provider  amphetamine-dextroamphetamine (ADDERALL) 30 MG tablet Take 0.5 tablets by mouth every morning. 06/21/21   [provider]  Ascorbic Acid (VITAMIN C PO) Take 1 tablet by mouth every morning.    [provider]  CALCIUM PO Take 1 tablet by mouth every morning.    [provider]  Cholecalciferol (VITAMIN D3 PO) Take 1 tablet by mouth every morning.    [provider]  clonazePAM (KLONOPIN) 1 MG tablet Take 1 mg by mouth at bedtime. 01/20/21   [provider]  doxepin (SINEQUAN) 75 MG capsule Take 75 mg by mouth at bedtime. 12/22/20   [provider]  ferrous sulfate 325 (65 FE) MG EC tablet Take 1 tablet (325 mg total) by mouth daily with breakfast. 08/16/21   Lincoln Brigham, PA-C  LINZESS 290 MCG CAPS capsule Take 290 mcg by mouth daily. 08/10/21   [provider]  Menaquinone-7 (K2 PO) Take 1 tablet by mouth every morning.    [provider]  Multiple Vitamin (MULTIVITAMIN WITH MINERALS) TABS tablet Take 1 tablet by mouth every  morning.    [provider]  NIACIN PO Take 1 tablet by mouth every morning.    [provider]  pantoprazole (PROTONIX) 40 MG tablet Take 40 mg by mouth 2 (two) times daily. 04/08/21   [provider]  polyethylene glycol (MIRALAX / GLYCOLAX) 17 g packet Take 17 g by mouth 2 (two) times daily. 07/10/21   Alma Friendly, MD  potassium chloride SA (KLOR-CON M) 20 MEQ tablet Take 2 tablets (40 mEq total) by mouth daily. 03/03/22   Walisiewicz, Harley Hallmark, PA-C  promethazine (PHENERGAN) 25 MG tablet Take 1 tablet (25 mg total) by mouth every 6 (six) hours as needed for nausea or vomiting. 02/15/21   Charlesetta Shanks, MD  sucralfate (CARAFATE) 1 g tablet Take 1 g by mouth 4 (four) times daily. 04/28/21   [provider]  topiramate (TOPAMAX) 50 MG tablet Take 150 mg by mouth every morning. 01/11/21   [provider]  TURMERIC PO Take 1 capsule by mouth every morning.    [provider]  Vortioxetine HBr (TRINTELLIX PO) Take by mouth.    [provider]      Allergies    Amoxicillin, Bactrim [sulfamethoxazole-trimethoprim], and Zithromax [azithromycin]    Review of Systems   Review of Systems  Unable to perform ROS: Mental status change    Physical Exam Updated Vital Signs BP 123/79   Pulse 98   Temp (!) 96.8 F (  36 C) (Temporal)   Resp 20   Wt 60 kg   SpO2 100%   BMI 22.18 kg/m  Physical Exam Vitals and nursing note reviewed.  Constitutional:      Appearance: Normal appearance. She is well-developed.  HENT:     Head: Normocephalic and atraumatic.     Mouth/Throat:     Mouth: Mucous membranes are moist.  Eyes:     Pupils: Pupils are equal, round, and reactive to light.  Cardiovascular:     Rate and Rhythm: Normal rate and regular rhythm.  Pulmonary:     Effort: Pulmonary effort is normal. No respiratory distress.  Abdominal:     Palpations: Abdomen is soft.     Tenderness: There is no abdominal tenderness.   Musculoskeletal:     Cervical back: Neck supple.  Skin:    General: Skin is warm and dry.  Neurological:     Mental Status: She is alert.     Comments: Moves all 4 extremities, appears to follow simple commands, speaking nonsensically, unable to assess orientation  Psychiatric:        Mood and Affect: Mood normal.     ED Results / Procedures / Treatments   Labs (all labs ordered are listed, but only abnormal results are displayed) Labs Reviewed  APTT - Abnormal; Notable for the following components:      Result Value   aPTT 23 (*)    All other components within normal limits  CBC - Abnormal; Notable for the following components:   WBC 11.3 (*)    MCV 100.5 (*)    All other components within normal limits  DIFFERENTIAL - Abnormal; Notable for the following components:   Neutro Abs 8.5 (*)    All other components within normal limits  COMPREHENSIVE METABOLIC PANEL - Abnormal; Notable for the following components:   CO2 12 (*)    Creatinine, Ser 1.71 (*)    GFR, Estimated 36 (*)    Anion gap 23 (*)    All other components within normal limits  RAPID URINE DRUG SCREEN, HOSP PERFORMED - Abnormal; Notable for the following components:   Benzodiazepines POSITIVE (*)    All other components within normal limits  URINALYSIS, ROUTINE W REFLEX MICROSCOPIC - Abnormal; Notable for the following components:   Color, Urine STRAW (*)    All other components within normal limits  I-STAT CHEM 8, ED - Abnormal; Notable for the following components:   Sodium 148 (*)    Creatinine, Ser 1.20 (*)    TCO2 13 (*)    All other components within normal limits  ETHANOL  PROTIME-INR  VITAMIN B1  VITAMIN B12  FOLATE  TSH  AMMONIA  I-STAT BETA HCG BLOOD, ED ([facility], [facility], [facility] ONLY)  CBG MONITORING, ED    EKG EKG Interpretation  Date/Time:  Tuesday June 06 2022 01:57:25 EDT Ventricular Rate:  132 PR Interval:    QRS Duration: 146 QT Interval:  429 QTC Calculation: 584 R Axis:   70 Text  Interpretation: Sinus tachycardia Nonspecific intraventricular conduction delay Repol abnrm suggests ischemia, lateral leads Artifact Confirmed by Thayer Jew [phone]) on 06/06/2022 4:27:50 AM  Radiology MR BRAIN WO CONTRAST  Result Date: 06/06/2022 CLINICAL DATA:  Acute neurologic deficit EXAM: MRI HEAD WITHOUT CONTRAST TECHNIQUE: Multiplanar, multiecho pulse sequences of the brain and surrounding structures were obtained without intravenous contrast. COMPARISON:  None Available. FINDINGS: Due to patient altered mental status, only diffusion-weighted imaging could be obtained. There is no acute infarct. IMPRESSION:  No acute infarct. Electronically Signed   By: Ulyses Jarred M.D.   On: 06/06/2022 02:22   CT ANGIO HEAD NECK W WO CM (CODE STROKE)  Result Date: 06/06/2022 CLINICAL DATA:  Acute neurologic deficit EXAM: CT ANGIOGRAPHY HEAD AND NECK TECHNIQUE: Multidetector CT imaging of the head and neck was performed using the standard protocol during bolus administration of intravenous contrast. Multiplanar CT image reconstructions and MIPs were obtained to evaluate the vascular anatomy. Carotid stenosis measurements (when applicable) are obtained utilizing NASCET criteria, using the distal internal carotid diameter as the denominator. RADIATION DOSE REDUCTION: This exam was performed according to the departmental dose-optimization program which includes automated exposure control, adjustment of the mA and/or kV according to patient size and/or use of iterative reconstruction technique. CONTRAST:  63m OMNIPAQUE IOHEXOL 350 MG/ML SOLN COMPARISON:  None Available. FINDINGS: CTA NECK FINDINGS SKELETON: There is no bony spinal canal stenosis. No lytic or blastic lesion. OTHER NECK: Normal pharynx, larynx and major salivary glands. No cervical lymphadenopathy. Unremarkable thyroid gland. UPPER CHEST: No pneumothorax or pleural effusion. No nodules or masses. AORTIC ARCH: There is no calcific atherosclerosis  of the aortic arch. There is no aneurysm, dissection or hemodynamically significant stenosis of the visualized portion of the aorta. Conventional 3 vessel aortic branching pattern. The visualized proximal subclavian arteries are widely patent. RIGHT CAROTID SYSTEM: Normal without aneurysm, dissection or stenosis. LEFT CAROTID SYSTEM: Normal without aneurysm, dissection or stenosis. VERTEBRAL ARTERIES: Left dominant configuration. Both origins are clearly patent. There is no dissection, occlusion or flow-limiting stenosis to the skull base (V1-V3 segments). CTA HEAD FINDINGS POSTERIOR CIRCULATION: --Vertebral arteries: Normal V4 segments. --Inferior cerebellar arteries: Normal. --Basilar artery: Normal. --Superior cerebellar arteries: Normal. --Posterior cerebral arteries (PCA): Normal. ANTERIOR CIRCULATION: --Intracranial internal carotid arteries: Normal. --Anterior cerebral arteries (ACA): Normal. Both A1 segments are present. Patent anterior communicating artery (a-comm). --Middle cerebral arteries (MCA): Normal. VENOUS SINUSES: As permitted by contrast timing, patent. ANATOMIC VARIANTS: None Review of the MIP images confirms the above findings. IMPRESSION: Normal CTA of the head and neck. Electronically Signed   By: KUlyses JarredM.D.   On: 06/06/2022 01:42   CT HEAD CODE STROKE WO CONTRAST  Addendum Date: 06/06/2022   ADDENDUM REPORT: 06/06/2022 01:25 ADDENDUM: These results were communicated to Dr. SDonnetta Simpersat 1:17 am on 06/06/2022 by text page via the [facility] system. Electronically Signed   By: KUlyses JarredM.D.   On: 06/06/2022 01:25   Result Date: 06/06/2022 CLINICAL DATA:  Code stroke.  Acute neurologic deficit EXAM: CT HEAD WITHOUT CONTRAST TECHNIQUE: Contiguous axial images were obtained from the base of the skull through the vertex without intravenous contrast. RADIATION DOSE REDUCTION: This exam was performed according to the departmental dose-optimization program which  includes automated exposure control, adjustment of the mA and/or kV according to patient size and/or use of iterative reconstruction technique. COMPARISON:  12/15/2007 FINDINGS: Brain: There is no mass, hemorrhage or extra-axial collection. The size and configuration of the ventricles and extra-axial CSF spaces are normal. The brain parenchyma is normal, without evidence of acute or chronic infarction. Vascular: No abnormal hyperdensity of the major intracranial arteries or dural venous sinuses. No intracranial atherosclerosis. Skull: The visualized skull base, calvarium and extracranial soft tissues are normal. Sinuses/Orbits: No fluid levels or advanced mucosal thickening of the visualized paranasal sinuses. No mastoid or middle ear effusion. The orbits are normal. ASPECTS ([facility] Early CT Score) - Ganglionic level infarction (caudate, lentiform nuclei, internal capsule, insula, M1-M3 cortex): 7 -  Supraganglionic infarction (M4-M6 cortex): 3 Total score (0-10 with 10 being normal): 10 IMPRESSION: 1. No acute intracranial abnormality. 2. ASPECTS is 10. Electronically Signed: By: Ulyses Jarred M.D. On: 06/06/2022 01:19    Procedures Procedures    Medications Ordered in ED Medications  LORazepam (ATIVAN) injection 2 mg (2 mg Intravenous Given 06/06/22 0136)  iohexol (OMNIPAQUE) 350 MG/ML injection 60 mL (60 mLs Intravenous Contrast Given 06/06/22 0127)  sodium chloride 0.9 % bolus 1,000 mL (1,000 mLs Intravenous New Bag/Given 06/06/22 0400)    ED Course/ Medical Decision Making/ A&P Clinical Course as of 06/06/22 0436  Tue Jun 06, 2022  0422 Husband at the bedside.  No real change.  Patient does stop and look at me when I began to speak. [CH]    Clinical Course User Index [CH] [place] Arch, Barbette Hair, MD                           Medical Decision Making Amount and/or Complexity of Data Reviewed Labs: ordered. Radiology: ordered.  Risk Decision regarding  hospitalization.   This patient presents to the ED for concern of altered mental status, this involves an extensive number of treatment options, and is a complaint that carries with it a high risk of complications and morbidity.  I considered the following differential and admission for this acute, potentially life threatening condition.  The differential diagnosis includes stroke, encephalopathy, toxidrome, psych  MDM:    This is a 51 year old female who presents with altered mental status.  Initially was told she was within the stroke window; however, it sounds like she has been somewhat altered since after lunchtime.  She is not clearly focal but does have incomprehensible speech.  She does appear to have receptive language intact.  She will also stop and can pay attention but then returns to altered speech.  Neurology at the bedside.  CT head and MRI are reassuring.  He does not believe this is consistent with a stroke.  He is made recommendations including additional lab work such as B12 and folate and an EEG.  Labs obtained.  She has slight hyponatremia at 148.  No other significant metabolic derangements.  UDS is positive for benzodiazepines.  Alcohol level is negative.  Clinically, her status is really unchanged.  Husband is at the bedside.  Reports she had a similar self-limited episode 1 week ago.  Does not appear to be infectious as she does not have a history and is afebrile.  Do not feel she needs an LP at this time.  We will plan for admission for ongoing work-up.  (Labs, imaging, consults)  Labs: I Ordered, and personally interpreted labs.  The pertinent results include: CBC, BMP, EtOH, UDS  Imaging Studies ordered: I ordered imaging studies including CT head, MRI I independently visualized and interpreted imaging. I agree with the radiologist interpretation  Additional history obtained from EMS, husband.  External records from outside source obtained and reviewed including prior  evaluations  Cardiac Monitoring: The patient was maintained on a cardiac monitor.  I personally viewed and interpreted the cardiac monitored which showed an underlying rhythm of: Sinus rhythm  Reevaluation: After the interventions noted above, I reevaluated the patient and found that they have :stayed the same  Social Determinants of Health: Lives with husband  Disposition: Admit  Co morbidities that complicate the patient evaluation  Past Medical History:  Diagnosis Date   Abnormal TSH    ADD (attention deficit disorder)  Anemia    Anxiety    Arrhythmia    Decreased libido    Depression    Heart murmur    Hyperlipidemia    Hypokalemia    Migraine    Nephrolithiasis    Pap smear abnormality of cervix/human papillomavirus (HPV) positive    Sleep difficulties    SVT (supraventricular tachycardia) (HCC)      Medicines Meds ordered this encounter  Medications   LORazepam (ATIVAN) injection 2 mg   iohexol (OMNIPAQUE) 350 MG/ML injection 60 mL   LORazepam (ATIVAN) 2 MG/ML injection    Janalyn Harder D: cabinet override   sodium chloride 0.9 % bolus 1,000 mL    I have reviewed the patients home medicines and have made adjustments as needed  Problem List / ED Course: Problem List Items Addressed This Visit   None Visit Diagnoses     Altered mental status, unspecified altered mental status type    -  Primary   Encephalopathy                       Final Clinical Impression(s) / ED Diagnoses Final diagnoses:  Altered mental status, unspecified altered mental status type  Encephalopathy    Rx / DC Orders ED Discharge Orders     None         Shammara Jarrett, Barbette Hair, MD 06/06/22 [phone]

## 2022-06-06 NOTE — ED Notes (Signed)
EEG at bedside.

## 2022-06-06 NOTE — ED Triage Notes (Signed)
Pt BIB EMS as Code Stroke. LKW confirmed with family 1830. Family woke up at 2230 with word salad/incomprehensible speech, altered mental status, and dizziness. No hx stroke.  EMS reports pt had a fall 86yrago and has had balance issues since. Pt is alert and seems to understand what is being said, follows some commands. VSS with EMS  [id number]

## 2022-06-06 NOTE — Consult Note (Signed)
NEUROLOGY CONSULTATION NOTE   Date of service: June 06, 2022 Patient Name: Emily Park MRN:  [id number] DOB:  01-01-1971 Reason for consult: "Slurred speech, " Requesting Provider: Merryl Hacker, MD _ _ _   _ __   _ __ _ _  __ __   _ __   __ _  History of Present Illness  Emily Park is a 51 y.o. female with PMH significant for depression, hyperlipidemia, chronic hypokalemia, migraine, iron deficiency anemia, recurrent rectal prolapse.  She was confused today and slurring her speech and not making sense and that husband called EMS.  Husband reports that she seemed fine in the morning when he left for work and afternoon she texted him and seemed fine.  He came home and noted that she was slurring her speech a little bit and seemed like she was drunk and was unable to walk.  She went to bed at 1830 on 06/05/2022.  She woke up at 2230 on 06/05/2022 and seemed significantly worse and not making sense and having trouble following directions and commands.  Husband called EMS.  Husband reports that she had a similar looking episode about a week ago on Thursday.  This happened when she took too much of her medications.  She seems to be on Adderall, Klonopin, doxepin, Trintellix.  Husband reports that she rarely uses her Adderall however, feel history show that she has been filling it at her pharmacy consistently.  No fever, no chills, no headache, no cough, no shortness of breath.  LKW: 6295 on 06/05/2022. mRS: 0 tNKASE: Not offered due to low suspicion for stroke. Thrombectomy: Not offered due to no LVO. NIHSS components Score: Comment  1a Level of Conscious 0'[x]'$  1'[]'$  2'[]'$  3'[]'$      1b LOC Questions 0'[]'$  1'[]'$  2'[x]'$       1c LOC Commands 0'[]'$  1'[x]'$  2'[]'$       2 Best Gaze 0'[x]'$  1'[]'$  2'[]'$       3 Visual 0'[x]'$  1'[]'$  2'[]'$  3'[]'$      4 Facial Palsy 0'[x]'$  1'[]'$  2'[]'$  3'[]'$      5a Motor Arm - left 0'[x]'$  1'[]'$  2'[]'$  3'[]'$  4'[]'$  UN'[]'$    5b Motor Arm - Right 0'[x]'$  1'[]'$  2'[]'$  3'[]'$  4'[]'$  UN'[]'$    6a Motor Leg - Left 0'[x]'$  1'[]'$  2'[]'$  3'[]'$  4'[]'$  UN'[]'$     6b Motor Leg - Right 0'[x]'$  1'[]'$  2'[]'$  3'[]'$  4'[]'$  UN'[]'$    7 Limb Ataxia 0'[x]'$  1'[]'$  2'[]'$  3'[]'$  UN'[]'$     8 Sensory 0'[x]'$  1'[]'$  2'[]'$  UN'[]'$      9 Best Language 0'[]'$  1'[]'$  2'[x]'$  3'[]'$      10 Dysarthria 0'[x]'$  1'[]'$  2'[]'$  UN'[]'$      11 Extinct. and Inattention 0'[x]'$  1'[]'$  2'[]'$       TOTAL: 5    ROS  Unable to obtain detailed review of system due to aphasia/encephalopathy.  Past History   Past Medical History:  Diagnosis Date   Abnormal TSH    ADD (attention deficit disorder)    Anemia    Anxiety    Arrhythmia    Decreased libido    Depression    Heart murmur    Hyperlipidemia    Hypokalemia    Migraine    Nephrolithiasis    Pap smear abnormality of cervix/human papillomavirus (HPV) positive    Sleep difficulties    SVT (supraventricular tachycardia) (HCC)    Past Surgical History:  Procedure Laterality Date   ABLATION     Cardiac ablation, endometrial ablation   BREAST LUMPECTOMY     right breast  BREAST LUMPECTOMY Right    CARDIOVERSION     COLONOSCOPY WITH PROPOFOL N/A 05/16/2021   Procedure: COLONOSCOPY WITH PROPOFOL;  Surgeon: Wilford Corner, MD;  Location: [facility];  Service: Endoscopy;  Laterality: N/A;   COLPOSCOPY     w/ cervical biopsy   DILITATION & CURRETTAGE/HYSTROSCOPY WITH NOVASURE ABLATION N/A 09/11/2014   Procedure: DILATATION & CURETTAGE/HYSTEROSCOPY WITH NOVASURE ABLATION;  Surgeon: Lovenia Kim, MD;  Location: [facility];  Service: Gynecology;  Laterality: N/A;   GIVENS CAPSULE STUDY N/A 05/16/2021   Procedure: GIVENS CAPSULE STUDY;  Surgeon: Wilford Corner, MD;  Location: [facility];  Service: Endoscopy;  Laterality: N/A;   XI ROBOT ASSISTED RECTOPEXY N/A 07/07/2021   Procedure: XI ROBOT ASSISTED RECTOPEXY;  Surgeon: [name], MD;  Location: [facility];  Service: General;  Laterality: N/A;   XI ROBOTIC ASSISTED LOWER ANTERIOR RESECTION N/A 07/07/2021   Procedure: XI ROBOTIC ASSISTED LOW ANTERIOR RECTOSIGMOID RESECTION WITH RIGID PROCTOSCOPY, TAP BLOCK;  Surgeon: [name],  MD;  Location: [facility];  Service: General;  Laterality: N/A;   Family History  Problem Relation Age of Onset   Hypothyroidism Mother    Depression Mother    Cancer Mother        thyroid   Thyroid disease Mother    Hypertension Father    Heart attack Father        Age 34   AAA (abdominal aortic aneurysm) Father    Hypothyroidism Sister    Hyperthyroidism Sister    Diabetes Maternal Grandfather    Breast cancer Paternal Grandmother    Social History   Socioeconomic History   Marital status: Married    Spouse name: Not on file   Number of children: 2   Years of education: college   Highest education level: Not on file  Occupational History   Occupation: Therapist, 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   Right-handed.   60 ounces of tea and soda per day.   Social Determinants of Health   Financial Resource Strain: Not on file  Food Insecurity: Not on file  Transportation Needs: Not on file  Physical Activity: Not on file  Stress: Not on file  Social Connections: Not on file   Allergies  Allergen Reactions   Amoxicillin Rash   Bactrim [Sulfamethoxazole-Trimethoprim] Rash    Rash noted to chest and back   Zithromax [Azithromycin] Rash    Medications  (Not in a hospital admission)    Vitals   Vitals:   06/06/22 0100  Weight: 60 kg     Body mass index is 22.18 kg/m.  Physical Exam   General: Laying comfortably in bed; in no acute distress.  HENT: Normal oropharynx and mucosa. Normal  external appearance of ears and nose.  Neck: Supple, no pain or tenderness  CV: No JVD. No peripheral edema.  Pulmonary: Symmetric Chest rise. Normal respiratory  effort.  Abdomen: Soft to touch, non-tender.  Ext: No cyanosis, edema, or deformity  Skin: No rash. Normal palpation of skin.   Musculoskeletal: Normal digits and nails by inspection. No clubbing.   Neurologic Examination  Mental status/Cognition: Alert, makes eye contact, oriented to self otherwise not making much sense and garbled speech.   Speech/language: Fluent, mostly incomprehensible but will intermittently speak in short phrase or full sentence.  Names some object.  Does seem to mimic.  Cranial nerves:   CN II Pupils equal and reactive to light, no VF deficits    CN III,IV,VI EOM intact, no gaze preference or deviation, no nystagmus    CN V normal sensation in V1, V2, and V3 segments bilaterally    CN VII no asymmetry, no nasolabial fold flattening    CN VIII normal hearing to speech    CN IX & X normal palatal elevation, no uvular deviation    CN XI 5/5 head turn and 5/5 shoulder shrug bilaterally    CN XII midline tongue protrusion    Motor:  Muscle bulk: normal, tone normal, pronator drift none tremor none Mvmt Root Nerve  Muscle Right Left Comments  SA C5/6 Ax Deltoid     EF C5/6 Mc Biceps 5 5   EE C6/7/8 Rad Triceps 5 5   WF C6/7 Med FCR     WE C7/8 PIN ECU     F Ab C8/T1 U ADM/FDI 5 5   HF L1/2/3 Fem Illopsoas 5 5   KE L2/3/4 Fem Quad     DF L4/5 D Peron Tib Ant 5 5   PF S1/2 Tibial Grc/Sol 5 5    Sensation:  Light touch Intact throughout   Pin prick    Temperature    Vibration   Proprioception    Coordination/Complex Motor:  - Finger to Nose intact bilaterally - Heel to shin unable to get her to do - Rapid alternating movement unable to do - Gait: deferred.  Labs   CBC:  Recent Labs  Lab 06/06/22 0110  HGB 14.6  HCT 69.6    Basic Metabolic Panel:  Lab Results  Component Value Date    NA 148 (H) 06/06/2022   K 3.6 06/06/2022   CO2 26 03/10/2022   GLUCOSE 93 06/06/2022   BUN 13 06/06/2022   CREATININE 1.20 (H) 06/06/2022   CALCIUM 9.3 03/10/2022   GFRNONAA >60 03/10/2022   GFRAA >60 11/14/2015   Lipid Panel:  Lab Results  Component Value Date   LDLCALC 137 (H) 02/24/2014   HgbA1c:  Lab Results  Component Value Date   HGBA1C 4.9 07/06/2021   Urine Drug Screen:     Component Value Date/Time   LABOPIA NONE DETECTED 04/13/2021 1413   COCAINSCRNUR NONE DETECTED 04/13/2021 1413   LABBENZ POSITIVE (A) 04/13/2021 1413   AMPHETMU NONE DETECTED 04/13/2021 1413   THCU NONE DETECTED 04/13/2021 1413   LABBARB NONE DETECTED 04/13/2021 1413    Alcohol Level     Component Value Date/Time   ETH <10 08/29/2020 1453    CT Head without contrast(Personally reviewed): CTH was negative for a large hypodensity concerning for a large territory infarct or hyperdensity concerning for an ICH  CT angio Head and Neck with contrast(Personally reviewed): No LVO  MRI Brain(Personally reviewed): No acute stroke on DWI images  rEEG:  pending  Impression   Emily Park is a 51 y.o. female with PMH significant for with PMH significant for depression, hyperlipidemia, chronic hypokalemia, migraine, iron  deficiency anemia, recurrent rectal prolapse.  She presents with confusion and appears euphoric.  I suspect the most likely etiology is toxic encephalopathy either in the setting of recreational substance use or polypharmacy or metabolic abnormalities.  Recommendations  - recommend CBC, chemistry, UA, urine drug screen. - thiamine, B12, folate, TSH. - routine EEG. ______________________________________________________________________   Thank you for the opportunity to take part in the care of this patient. If you have any further questions, please contact the neurology consultation attending.  Signed,  [place] Pager Number  [phone] _ _ _   _ __   _ __ _ _  __ __   _ __   __ _

## 2022-06-06 NOTE — Progress Notes (Signed)
Patient admitted this morning for confusion and speech difficulty work-up so far including CTA of the head/neck and MRI of brain have been negative.  Neurology following.  Patient seen and examined at bedside.  Discussed with husband at bedside.  I have reviewed patient's medical records myself including this morning's H&P, current vitals, labs and medications.  Follow EEG and further neurology recommendations.

## 2022-06-06 NOTE — ED Notes (Signed)
Pt removed purewick and had BM on self. Full bed linen provided and pt placed in brief

## 2022-06-06 NOTE — Progress Notes (Signed)
Progress note  Reason for Consult: episode of imbalance and speech disturbance Referring Physician: Dr. Starla Link  CC: episodes of feeling as though she is drunk  History is obtained from:patient, spouse and chart  HPI: Emily Park is a 51 y.o. female with history of depression, HLD, chronic hypokalemia, migraines, anemia and recurrent rectal prolapse who presents with an episode of imbalance and speech disturbance which started last night.  Patient texted her husband at work yesterday afternoon and stated that she was feeling funny.  She then went to sleep and woke up around 1030 PM.  At that time, she had incoordination, couldn't walk and her speech was nonsensical.  She reports that she has had 5 or 6 of these episodes over the past 6 months and that during the episodes she feels and acts as though she is drunk even though she has had no alcohol.  The last episode prior to this one occurred on 10/5, and patient returned completely back to baseline after a few hours.  During that event her adult son was holding her down as she was repeatedly trying to get up and falling.  She denies alcohol or drug use.  Patient reports that during the past six months, she has also had worsening GI and renal issues with frequent heartburn and diarrhea.  She states that she takes clonazepam and Adderall at home and that even though she keeps her pills locked up, she has noticed some of them going missing, with some concern that possibly her son is using some of her medications.  Has been also expresses concern about gastroparesis and IBS issues   ROS: A complete ROS was performed and is negative except as noted in the HPI.   Past Medical History:  Diagnosis Date   Abnormal TSH    ADD (attention deficit disorder)    Anemia    Anxiety    Arrhythmia    Decreased libido    Depression    Heart murmur    Hyperlipidemia    Hypokalemia    Migraine    Nephrolithiasis    Pap smear abnormality of cervix/human  papillomavirus (HPV) positive    Sleep difficulties    SVT (supraventricular tachycardia)      Family History  Problem Relation Age of Onset   Hypothyroidism Mother    Depression Mother    Cancer Mother        thyroid   Thyroid disease Mother    Hypertension Father    Heart attack Father        Age 81   AAA (abdominal aortic aneurysm) Father    Hypothyroidism Sister    Hyperthyroidism Sister    Diabetes Maternal Grandfather    Breast cancer Paternal Grandmother      Social History:   reports that she has never smoked. She has been exposed to tobacco smoke. She has never used smokeless tobacco. She reports that she does not currently use alcohol after a past usage of about 2.0 standard drinks of alcohol per week. She reports that she does not currently use drugs.  Medications  Current Facility-Administered Medications:    acetaminophen (TYLENOL) tablet 650 mg, 650 mg, Oral, Q6H PRN **OR** acetaminophen (TYLENOL) suppository 650 mg, 650 mg, Rectal, Q6H PRN, Opyd, Ilene Qua, MD   enoxaparin (LOVENOX) injection 40 mg, 40 mg, Subcutaneous, Q24H, Opyd, Ilene Qua, MD, 40 mg at 06/06/22 0904   ondansetron (ZOFRAN) injection 4 mg, 4 mg, Intravenous, Q6H PRN, Aline August, MD   sodium  bicarbonate 150 mEq in dextrose 5 % 1,150 mL infusion, , Intravenous, Continuous, Alekh, Kshitiz, MD, Last Rate: 125 mL/hr at 06/06/22 1339, New Bag at 06/06/22 1339   sodium chloride flush (NS) 0.9 % injection 3 mL, 3 mL, Intravenous, Q12H, Opyd, Ilene Qua, MD  Current Outpatient Medications:    acetaminophen (TYLENOL) 500 MG tablet, Take 1,000 mg by mouth daily as needed for headache., Disp: , Rfl:    Ascorbic Acid (VITAMIN C PO), Take 1 tablet by mouth every morning., Disp: , Rfl:    Calcium Carbonate Antacid (TUMS PO), Take 1 tablet by mouth as needed (heartburn)., Disp: , Rfl:    CALCIUM PO, Take 1 tablet by mouth every morning., Disp: , Rfl:    Cholecalciferol (VITAMIN D3) 50 MCG (2000 UT)  capsule, Take 2,000 Units by mouth daily., Disp: , Rfl:    clonazePAM (KLONOPIN) 1 MG tablet, Take 1 mg by mouth at bedtime., Disp: , Rfl:    doxepin (SINEQUAN) 75 MG capsule, Take 75 mg by mouth at bedtime., Disp: , Rfl:    hyoscyamine (LEVSIN SL) 0.125 MG SL tablet, Place 0.25-0.5 mg under the tongue as needed for cramping., Disp: , Rfl:    LINZESS 290 MCG CAPS capsule, Take 290 mcg by mouth daily., Disp: , Rfl:    Magnesium Hydroxide (MILK OF MAGNESIA PO), Take 1 Dose by mouth as needed (constipation)., Disp: , Rfl:    Multiple Vitamin (MULTIVITAMIN WITH MINERALS) TABS tablet, Take 1 tablet by mouth every morning., Disp: , Rfl:    Multiple Vitamins-Minerals (ZINC PO), Take 1 tablet by mouth daily., Disp: , Rfl:    NIACIN PO, Take 1 tablet by mouth every morning., Disp: , Rfl:    ondansetron (ZOFRAN-ODT) 8 MG disintegrating tablet, Take 8 mg by mouth daily as needed for nausea or vomiting., Disp: , Rfl:    pantoprazole (PROTONIX) 40 MG tablet, Take 80 mg by mouth 2 (two) times daily., Disp: , Rfl:    polyethylene glycol (MIRALAX / GLYCOLAX) 17 g packet, Take 17 g by mouth 2 (two) times daily. (Patient taking differently: Take 17 g by mouth daily as needed for moderate constipation.), Disp: 14 each, Rfl: 0   potassium chloride SA (KLOR-CON M) 20 MEQ tablet, Take 2 tablets (40 mEq total) by mouth daily. (Patient taking differently: Take 40 mEq by mouth in the morning, at noon, in the evening, and at bedtime.), Disp: 20 tablet, Rfl: 0   traZODone (DESYREL) 50 MG tablet, Take 50-100 mg by mouth at bedtime as needed for sleep., Disp: , Rfl:    vortioxetine HBr (TRINTELLIX) 20 MG TABS tablet, Take 20 mg by mouth daily., Disp: , Rfl:    amphetamine-dextroamphetamine (ADDERALL) 30 MG tablet, Take 0.5 tablets by mouth every morning. (Patient not taking: Reported on 06/06/2022), Disp: , Rfl:    ferrous sulfate 325 (65 FE) MG EC tablet, Take 1 tablet (325 mg total) by mouth daily with breakfast. (Patient not  taking: Reported on 06/06/2022), Disp: 30 tablet, Rfl: 3   promethazine (PHENERGAN) 25 MG tablet, Take 1 tablet (25 mg total) by mouth every 6 (six) hours as needed for nausea or vomiting. (Patient not taking: Reported on 06/06/2022), Disp: 20 tablet, Rfl: 0   sucralfate (CARAFATE) 1 g tablet, Take 1 g by mouth 4 (four) times daily. (Patient not taking: Reported on 06/06/2022), Disp: , Rfl:    topiramate (TOPAMAX) 50 MG tablet, Take 150 mg by mouth every morning. (Patient not taking: Reported on 06/06/2022), Disp: ,  Rfl:    Exam: Current vital signs: BP 116/79   Pulse (!) 102   Temp 97.9 F (36.6 C) (Oral)   Resp 20   Ht 5' 4.75" (1.645 m)   Wt 60 kg   SpO2 95%   BMI 22.18 kg/m  Vital signs in last 24 hours: Temp:  [96.8 F (36 C)-98.1 F (36.7 C)] 97.9 F (36.6 C) (10/10 1634) Pulse Rate:  [65-113] 102 (10/10 1700) Resp:  [16-39] 20 (10/10 1700) BP: (101-137)/(69-103) 116/79 (10/10 1700) SpO2:  [95 %-100 %] 95 % (10/10 1700) Weight:  [60 kg] 60 kg (10/10 0819)  GENERAL: Awake, alert, anxious and in mild distress Psych: Patient is anxious and easily distractible with tangential speech Head: Normocephalic and atraumatic, without obvious abnormality EENT: Normal conjunctivae, dry mucous membranes, no OP obstruction LUNGS: Normal respiratory effort. Non-labored breathing on room air Extremities: warm, well perfused, without obvious deformity  NEURO:  Mental Status: Awake, alert, and oriented to person, place, time, and situation. She is able to provide some history of present illness. Perseverates on different topics at times Speech/Language: speech is clear and fluent at time of exam but patient had some stuttering and inability to find words earlier in the morning.   Naming, repetition, fluency, and comprehension intact without aphasia  No neglect is noted Cranial Nerves:  II: PERRL visual fields full.  III, IV, VI: EOMI. Lid elevation symmetric and full.  V: Sensation is  intact to light touch and symmetrical to face.  VII: Face is symmetric resting and smiling.  VIII: Hearing intact to voice IX, X: Phonation normal.  XI: Normal sternocleidomastoid and trapezius muscle strength XII: Tongue protrudes midline without fasciculations.   Motor: 5/5 strength is all muscle groups.  Tone is normal. Bulk is normal.  Sensation: Intact to light touch bilaterally in all four extremities.  Coordination: FTN intact bilaterally, although patient grasps examiner's finger and brings it to her nose. No pronator drift. Alternating hand movements.  DTRs: 2+ throughout.  Gait: Deferred  On later attending reevaluation at 8 PM (family requested speaking with neurology), patient is essentially back to her baseline with clear speech, fully oriented, no perseveration, eager to leave the hospital and request everything be sent to her through [place] I have reviewed labs in epic and the results pertinent to this consultation are:   Basic Metabolic Panel: Recent Labs  Lab 06/06/22 0110 06/06/22 0205 06/06/22 0608 06/06/22 1050  NA 148* 143 148* 145  K 3.6 3.6 3.7 3.1*  CL 110 108  --  118*  CO2  --  12*  --  13*  GLUCOSE 93 84  --  72  BUN 13 12  --  11  CREATININE 1.20* 1.71*  --  1.67*  CALCIUM  --  9.8  --  9.4  MG  --   --   --  2.6*    CBC: Recent Labs  Lab 06/06/22 0110 06/06/22 0205 06/06/22 0608 06/06/22 1145  WBC  --  11.3*  --  17.2*  NEUTROABS  --  8.5*  --  14.3*  HGB 14.6 12.5 12.6 11.8*  HCT 43.0 39.5 37.0 35.5*  MCV  --  100.5*  --  97.0  PLT  --  305  --  291    Coagulation Studies: Recent Labs    06/06/22 0205  LABPROT 13.4  INR 1.0      Lactic acid >9.0  Imaging I have reviewed the images obtained:  CT-scan of  the brain:  No acute abnormality  CTA head and neck: No acute abnormality  EEG: This study is suggestive of moderate diffuse encephalopathy, nonspecific etiology. No seizures or epileptiform discharges were seen  throughout the recording.  MRI examination of the brain DWI only:  No acute infarct  Assessment: Patient with history of depression hyperlipidemia chronic hypokalemia migraine anemia and recurrent rectal prolapse presents with multiple episodes of garbled speech, imbalance and confusion.    Continue favor etiology most likely secondary to polypharmacy complicated by acute kidney injury as well as a metabolic acidosis.  Recommendations: -Appreciate work-up and management of lactic acidosis/metabolic acidosis per primary team, from a neurological perspective would continue to hold her topiramate as this medication can contribute -Continue to monitor leukocytosis which appears to be increasing, currently no clear etiology but potentially reactive from acute stress -Discussed with the patient that she is unlikely to be able to be discharged at this hour and discharge will likely happen tomorrow  Pt seen by NP/Neuro and later by MD. Note/plan to be edited by MD as needed.  [place] , MSN, AGACNP-BC [facility] See Amion for schedule and pager information 06/06/2022 6:38 PM

## 2022-06-06 NOTE — Procedures (Signed)
Patient Name: KIAN GAMARRA  MRN: [id number]  Epilepsy Attending: Lora Havens  Referring Physician/Provider: Vianne Bulls, MD Date: 06/06/2022 Duration: 24.04 mins  Patient history: 51 year old female with altered mental status.  EEG to evaluate for seizure.  Level of alertness: Awake  AEDs during EEG study: None  Technical aspects: This EEG study was done with scalp electrodes positioned according to the 10-20 International system of electrode placement. Electrical activity was reviewed with band pass filter of 1-'70Hz'$ , sensitivity of 7 uV/mm, display speed of 66m/sec with a '60Hz'$  notched filter applied as appropriate. EEG data were recorded continuously and digitally stored.  Video monitoring was available and reviewed as appropriate.  Description: EEG showed continuous generalized mixed frequencies with predominantly 5-'9Hz'$  theta-alpha activity admixed with 2-'3Hz'$  intermittent delta slowing. Hyperventilation and photic stimulation were not performed.      ABNORMALITY - Continuous slow, generalized   IMPRESSION: This study is suggestive of moderate diffuse encephalopathy, nonspecific etiology. No seizures or epileptiform discharges were seen throughout the recording.  Ghazi Rumpf OBarbra Sarks

## 2022-06-06 NOTE — Progress Notes (Signed)
EEG complete - results pending 

## 2022-06-06 NOTE — ED Notes (Signed)
Pt husband brought to bedside

## 2022-06-06 NOTE — ED Notes (Signed)
Code stroke cancelled 

## 2022-06-06 NOTE — ED Notes (Signed)
Patients mother called RN spoke with her she has some concerns with the medications that the patient is on and also family history that the husband may not be relaying to providers. Md aware.

## 2022-06-07 DIAGNOSIS — N179 Acute kidney failure, unspecified: Secondary | ICD-10-CM | POA: Diagnosis not present

## 2022-06-07 DIAGNOSIS — F909 Attention-deficit hyperactivity disorder, unspecified type: Secondary | ICD-10-CM | POA: Diagnosis not present

## 2022-06-07 DIAGNOSIS — G9340 Encephalopathy, unspecified: Secondary | ICD-10-CM | POA: Diagnosis not present

## 2022-06-07 DIAGNOSIS — E8729 Other acidosis: Secondary | ICD-10-CM | POA: Diagnosis not present

## 2022-06-07 DIAGNOSIS — F330 Major depressive disorder, recurrent, mild: Secondary | ICD-10-CM | POA: Diagnosis not present

## 2022-06-07 DIAGNOSIS — N182 Chronic kidney disease, stage 2 (mild): Secondary | ICD-10-CM | POA: Diagnosis not present

## 2022-06-07 LAB — CBC
HCT: 30.4 % — ABNORMAL LOW (ref 36.0–46.0)
Hemoglobin: 10.5 g/dL — ABNORMAL LOW (ref 12.0–15.0)
MCH: 31.9 pg (ref 26.0–34.0)
MCHC: 34.5 g/dL (ref 30.0–36.0)
MCV: 92.4 fL (ref 80.0–100.0)
Platelets: 251 10*3/uL (ref 150–400)
RBC: 3.29 MIL/uL — ABNORMAL LOW (ref 3.87–5.11)
RDW: 13.2 % (ref 11.5–15.5)
WBC: 11.4 10*3/uL — ABNORMAL HIGH (ref 4.0–10.5)
nRBC: 0 % (ref 0.0–0.2)

## 2022-06-07 LAB — BASIC METABOLIC PANEL
Anion gap: 7 (ref 5–15)
BUN: 9 mg/dL (ref 6–20)
CO2: 23 mmol/L (ref 22–32)
Calcium: 8.5 mg/dL — ABNORMAL LOW (ref 8.9–10.3)
Chloride: 109 mmol/L (ref 98–111)
Creatinine, Ser: 1.47 mg/dL — ABNORMAL HIGH (ref 0.44–1.00)
GFR, Estimated: 43 mL/min — ABNORMAL LOW (ref >60)
Glucose, Bld: 112 mg/dL — ABNORMAL HIGH (ref 70–99)
Potassium: 3.1 mmol/L — ABNORMAL LOW (ref 3.5–5.1)
Sodium: 139 mmol/L (ref 135–145)

## 2022-06-07 LAB — CORTISOL: Cortisol, Plasma: 4.7 ug/dL

## 2022-06-07 LAB — VITAMIN B1: Vitamin B1 (Thiamine): 154.5 nmol/L (ref 66.5–200.0)

## 2022-06-07 LAB — TOPIRAMATE LEVEL: Topiramate Lvl: <1.5 ug/mL — ABNORMAL LOW (ref 2.0–25.0)

## 2022-06-07 LAB — MAGNESIUM: Magnesium: 2.1 mg/dL (ref 1.7–2.4)

## 2022-06-07 MED ORDER — MELATONIN 3 MG PO TABS
3.0000 mg | ORAL_TABLET | Freq: Every evening | ORAL | Status: DC | PRN
  Administered 2022-06-07: 3 mg via ORAL
  Filled 2022-06-07: qty 1

## 2022-06-07 MED ORDER — POTASSIUM CHLORIDE CRYS ER 20 MEQ PO TBCR
40.0000 meq | EXTENDED_RELEASE_TABLET | Freq: Two times a day (BID) | ORAL | Status: DC
  Filled 2022-06-07: qty 2

## 2022-06-07 MED ORDER — SODIUM CHLORIDE 0.9 % IV SOLN: INTRAVENOUS | Status: DC

## 2022-06-07 NOTE — TOC Transition Note (Addendum)
Transition of Care [facility]) - CM/SW Discharge Note   Patient Details  Name: Emily Park MRN: [id number] Date of Birth: [birth year]  [facility] CM/SW Contact:  Pollie Friar, RN Phone Number: 06/07/2022, 1:03 PM   Clinical Narrative:    Pt discharging home with self care. No needs per TOC.   SDOH Interventions Today    Flowsheet Row Most Recent Value  SDOH Interventions   Food Insecurity Interventions Inpatient TOC, Patient Refused  [pt states she has no issues getting food outside the hospital setting]         Final next level of care: Home/Self Care Barriers to Discharge: No Barriers Identified   Patient Goals and CMS Choice        Discharge Placement                       Discharge Plan and Services                                     Social Determinants of Health (SDOH) Interventions     Readmission Risk Interventions     No data to display

## 2022-06-07 NOTE — Plan of Care (Signed)
Explained to pt probable reasons why doctors don't want her to have sedatives or narcotics at this time r/t coming to hospital confused and speech difficulty. Head exams were negative so now they are going to look at some of her meds and if they had high levels because she was sick or something else. I explained the special IVF she was getting was to help buffer the blood chemistries and the doctors will look at her labs today and see if they need to change anything with her meds and they don't want to give her any of those meds until they find out what is going on.

## 2022-06-07 NOTE — Evaluation (Signed)
Physical Therapy Evaluation and Discharge  Patient Details Name: Emily Park MRN: [id number] DOB: 1971/07/13 Today's Date: 06/07/2022  History of Present Illness  Pt is a 51 y/o female who presents 06/06/22 with speech difficulties, AMS, and dizziness. Code stroke initiated, MRI negative for acute changes. PMH significant for ADD, anemia, anxiety, arrhythmia, migraine, hypokalemia, SVT.   Clinical Impression  Patient evaluated by Physical Therapy with no further acute PT needs identified. All education has been completed and the patient has no further questions. At the time of PT eval pt was able to demonstrate transfers and ambulation with independence. No unsteadiness or LOB noted with hallway ambulation. Pt reports no dizziness throughout session, states her speech is back to baseline, and is A/O x4. Pt will have adequate support upon d/c. See below for any follow-up Physical Therapy or equipment needs. PT is signing off. Thank you for this referral.        Recommendations for follow up therapy are one component of a multi-disciplinary discharge planning process, led by the attending physician.  Recommendations may be updated based on patient status, additional functional criteria and insurance authorization.  Follow Up Recommendations No PT follow up      Assistance Recommended at Discharge None  Patient can return home with the following  Assist for transportation    Equipment Recommendations None recommended by PT  Recommendations for Other Services       Functional Status Assessment Patient has not had a recent decline in their functional status     Precautions / Restrictions Precautions Precautions: None Restrictions Weight Bearing Restrictions: No      Mobility  Bed Mobility Overal bed mobility: Independent                  Transfers Overall transfer level: Independent Equipment used: None                    Ambulation/Gait Ambulation/Gait  assistance: Independent Gait Distance (Feet): 300 Feet Assistive device: None Gait Pattern/deviations: WFL(Within Functional Limits) Gait velocity: Decreased Gait velocity interpretation: 1.31 - 2.62 ft/sec, indicative of limited community ambulator   General Gait Details: Mildly slowed pace but appropriate for environment. No unsteadiness or LOB noted.  Stairs Stairs: Yes Stairs assistance: Modified independent (Device/Increase time) Stair Management: One rail Right, Alternating pattern, Forwards Number of Stairs: 5 General stair comments: Practice stairs in rehab gym. No assist required and no unsteadiness noted.  Wheelchair Mobility    Modified Rankin (Stroke Patients Only)       Balance Overall balance assessment: Modified Independent                                           Pertinent Vitals/Pain Pain Assessment Pain Assessment: Faces Faces Pain Scale: Hurts a little bit Pain Descriptors / Indicators: Headache Pain Intervention(s): Limited activity within patient's tolerance, Monitored during session, Repositioned    Home Living Family/patient expects to be discharged to:: Private residence Living Arrangements: Spouse/significant other;Children Available Help at Discharge: Family;Available 24 hours/day Type of Home: House Home Access: Stairs to enter Entrance Stairs-Rails: Can reach both Entrance Stairs-Number of Steps: 3   Home Layout: One level Home Equipment: None      Prior Function Prior Level of Function : Independent/Modified Independent;Driving             Mobility Comments: Does not work  Hand Dominance        Extremity/Trunk Assessment   Upper Extremity Assessment Upper Extremity Assessment: Overall WFL for tasks assessed (Grossly 4/5 to 4+/5 bilaterally. Likely baseline.)    Lower Extremity Assessment Lower Extremity Assessment: Overall WFL for tasks assessed (Grossly 4+/5 to 5/5 bilaterally.)    Cervical  / Trunk Assessment Cervical / Trunk Assessment: Normal  Communication   Communication: No difficulties  Cognition Arousal/Alertness: Awake/alert Behavior During Therapy: WFL for tasks assessed/performed Overall Cognitive Status: Within Functional Limits for tasks assessed                                          General Comments      Exercises     Assessment/Plan    PT Assessment Patient does not need any further PT services  PT Problem List         PT Treatment Interventions      PT Goals (Current goals can be found in the Care Plan section)  Acute Rehab PT Goals Patient Stated Goal: Home ASAP PT Goal Formulation: All assessment and education complete, DC therapy    Frequency       Co-evaluation               AM-PAC PT "6 Clicks" Mobility  Outcome Measure Help needed turning from your back to your side while in a flat bed without using bedrails?: None Help needed moving from lying on your back to sitting on the side of a flat bed without using bedrails?: None Help needed moving to and from a bed to a chair (including a wheelchair)?: None Help needed standing up from a chair using your arms (e.g., wheelchair or bedside chair)?: None Help needed to walk in hospital room?: None Help needed climbing 3-5 steps with a railing? : None 6 Click Score: 24    End of Session   Activity Tolerance: Patient tolerated treatment well Patient left: in bed;with call bell/phone within reach;with family/visitor present Nurse Communication: Mobility status PT Visit Diagnosis: Other symptoms and signs involving the nervous system (R29.898)    Time: 5277-8242 PT Time Calculation (min) (ACUTE ONLY): 16 min   Charges:   PT Evaluation $PT Eval Low Complexity: 1 Low          Rolinda Roan, PT, DPT Acute Rehabilitation Services Secure Chat Preferred Office: [phone]   Thelma Comp 06/07/2022, 1:09 PM

## 2022-06-07 NOTE — Progress Notes (Signed)
[facility] night cross cover note:   I was notified by RN of the patient's request for a sleep aid. I subsequently placed order for prn melatonin for insomnia.     Mercedees Convery, DO Hospitalist  

## 2022-06-07 NOTE — Consult Note (Signed)
[facility] New Face-to-Face Psychiatric Evaluation   Service Date: June 07, 2022 LOS:  LOS: 0 days    Assessment  Emily Park is a 51 y.o. female admitted medically for 06/06/2022  1:03 AM for encephalopathy. She carries the psychiatric diagnoses of ADD, anxiety, depression and has a past medical history of  chronic hypokalemia, IDA, recurrent rectal prolapse, HLD. Psychiatry was consulted for severe anxiety/depression and confusion by Dr. Starla Park.    Her presentation of encephalopathy is likely secondary to GI losses and inadequate PO intake. She presently does not meet inpatient psychiatric hospitalization criteria nor IVC criteria.  Current outpatient psychotropic medications include vorioxetine and alprazolam and historically she has had a fair response to these medications. She was compliant with alprazolam prior to admission as evidenced by positive UDS for benzodiazepine but NOT compliant with vortioxetine as she reports not taking any of her scheduled meds for 4-5 days as well as her dispense record showing only 3 months filling for past 6 months. On initial examination, patient appears to be calm, pleasant, and coherent throughout assessment. Patient has worked as a Marine scientist for 20 years. Please see plan below for detailed recommendations.   Diagnoses:  Active Hospital problems: Principal Problem:   Acute encephalopathy Active Problems:   Attention deficit hyperactivity disorder (ADHD)   Acute renal failure superimposed on stage 2 chronic kidney disease (HCC)   Anxiety   Mild episode of recurrent major depressive disorder (HCC)   High anion gap metabolic acidosis     Plan  ## Safety and Observation Level:  - Based on my clinical evaluation, I estimate the patient to be at minimal risk of self harm in the current setting   ## Medications:  -- Resume home klonopin to reduce risk of benzodiazepene withdrawal -- Patient should follow up with outpatient  psychiatrist for further psychotropic adjustments    ## Medical Decision Making Capacity:  Not formally assessed  ## Further Work-up:  -- recommend repeat ECG -- Pertinent labwork reviewed earlier this admission includes: lactic acid 9>2.9, WBC 17.2>11.4, Cr 1.56>1.47, Mg wnl, EEG shows nonspecific encephalopathy, UA wnl, MRI shows no acute infarct -- Likely toxic metabolic encephalopathy possibly due to benzodiazepine intoxication  ## Disposition:  -- home once medically cleared  Thank you for this consult request. Recommendations have been communicated to the primary team.  We will sign off at this time.   Emily Ravens, MD   NEW history  Relevant Aspects of Hospital Course:  Admitted on 06/06/2022 for acute toxic metabolic encephalopathy and lactic acidosis.  Patient Report:  Patient complaining of not sleeping x 4 nights. She normally takes klonopin for sleep, and sometimes takes doxepin. She sometimes doesn't have access to klonopin (runs out?). She has no recollection of coming into the hospital, but her encephalopathy appears to have improved during hospitalization without significant intervention beyond IVF. The last thing she remembers after coming in is her sister coming over and her speech getting slurred, pt had trouble getting up. This has happened before when she was going into septic shock.  Patient appeared more worried that her son is taking her meds. She has adderall which she "doesn't take" and has given both her klonopin and her adderall to her husband to lock up.  Reports son has gotten into the safe.  Reports son is going to court tomorrow for a DUI.   Patient states that she had not taken anything prior to this happening beyond Zofran.  Patient reports not taking  any scheduled medications for past 4 to 5 days with the exception of linzess and Klonopin 1 to 2 tablets at night.  Denied symptoms of chest pain, chest palpitation, dyspnea at that time.  Endorses decreased  appetite primarily due to stress and anxiety concerning son.  Does state that she has been taking her electrolyte supplementations as prescribed 2 times a day as husband has been regularly providing her with it.   Patient is alert and oriented x4 and able to do DOWB and computation. Denies present SI/HI/AVH.  Patient carries diagnosis of depression, anxiety, and OCD.  Does not feel she actually has OCD as she does not have any intrusive thoughts, regular checking, catastrophic thinking.  States she was given this diagnosis by psychiatrist after 2 virtual visits. Feels like she has to clean things, but not really impairing day to day life (denies checking behaviors). She does agree with dx depression + anxiety. No hx hallucinations. Dx with ADD/ADHD but does not take Adderall as prescribed as it makes her feel "jittery". Has had panic attacks in the past, but not for some time. Trintellix is new med (prev on prozac) - reports intermittent low mood but denies significant depression.    States she feels she was paranoid last night - felt like people were talking about her in the ED. Does feel like ED nurses couldn't wait to get rid of her (probably filtering nurse's desire to get pts medical beds through delirium/paranoia last night).   Endorses a lot of childhood trauma. Hoping to start in therapy towards the end of October.    Collateral information:  Attempted to reach spouse Emily Park but call went to voicemail.   Psychiatric History:  Information collected from patient: Dx: depression, anxiety Meds: adderall (not taking), klonopin 1 mg qhs (filling q28 days), doxepin 75 mg daily (filling q28 days), vortioxetine (not taking consistently). Has been on prozac (not effective), topamax (for migraines although inconsistent fill history), and trazodone   Social History:  Tobacco use: denies Alcohol use: denies Drug use: denies  Family History:  The patient's family history includes AAA  (abdominal aortic aneurysm) in her father; Breast cancer in her paternal grandmother; Cancer in her mother; Depression in her mother; Diabetes in her maternal grandfather; Heart attack in her father; Hypertension in her father; Hyperthyroidism in her sister; Hypothyroidism in her mother and sister; Thyroid disease in her mother.  Medical History: Past Medical History:  Diagnosis Date   Abnormal TSH    ADD (attention deficit disorder)    Anemia    Anxiety    Arrhythmia    Decreased libido    Depression    Heart murmur    Hyperlipidemia    Hypokalemia    Migraine    Nephrolithiasis    Pap smear abnormality of cervix/human papillomavirus (HPV) positive    Sleep difficulties    SVT (supraventricular tachycardia)     Surgical History: Past Surgical History:  Procedure Laterality Date   ABLATION     Cardiac ablation, endometrial ablation   BREAST LUMPECTOMY     right breast   BREAST LUMPECTOMY Right    CARDIOVERSION     COLONOSCOPY WITH PROPOFOL N/A 05/16/2021   Procedure: COLONOSCOPY WITH PROPOFOL;  Surgeon: Wilford Corner, MD;  Location: [facility];  Service: Endoscopy;  Laterality: N/A;   COLPOSCOPY     w/ cervical biopsy   DILITATION & CURRETTAGE/HYSTROSCOPY WITH NOVASURE ABLATION N/A 09/11/2014   Procedure: DILATATION & CURETTAGE/HYSTEROSCOPY WITH NOVASURE ABLATION;  Surgeon: Delfino Lovett  Beulah Gandy, MD;  Location: [facility];  Service: Gynecology;  Laterality: N/A;   GIVENS CAPSULE STUDY N/A 05/16/2021   Procedure: GIVENS CAPSULE STUDY;  Surgeon: Wilford Corner, MD;  Location: [facility];  Service: Endoscopy;  Laterality: N/A;   XI ROBOT ASSISTED RECTOPEXY N/A 07/07/2021   Procedure: XI ROBOT ASSISTED RECTOPEXY;  Surgeon: [name], MD;  Location: [facility];  Service: General;  Laterality: N/A;   XI ROBOTIC ASSISTED LOWER ANTERIOR RESECTION N/A 07/07/2021   Procedure: XI ROBOTIC ASSISTED LOW ANTERIOR RECTOSIGMOID RESECTION WITH RIGID PROCTOSCOPY, TAP BLOCK;  Surgeon: [name], MD;  Location: [facility];  Service: General;  Laterality: N/A;    Medications:   Current Facility-Administered Medications:    acetaminophen (TYLENOL) tablet 650 mg, 650 mg, Oral, Q6H PRN, 650 mg at 06/07/22 0929 **OR** acetaminophen (TYLENOL) suppository 650 mg, 650 mg, Rectal, Q6H PRN, Opyd, Ilene Qua, MD   enoxaparin (LOVENOX) injection 40 mg, 40 mg, Subcutaneous, Q24H, Opyd, Ilene Qua, MD, 40 mg at 06/07/22 4008   melatonin tablet 3 mg, 3 mg, Oral, QHS PRN, Howerter, Justin B, DO, 3 mg at 06/07/22 0029   ondansetron (ZOFRAN) injection 4 mg, 4 mg, Intravenous, Q6H PRN, Emily Park, Kshitiz, MD   potassium chloride SA (KLOR-CON M) CR tablet 40 mEq, 40 mEq, Oral, BID, Horris Latino, Adline Peals, MD  Current Outpatient Medications:    acetaminophen (TYLENOL) 500 MG tablet, Take 1,000 mg by mouth daily as needed for headache., Disp: , Rfl:    Ascorbic Acid (VITAMIN C PO), Take 1 tablet by mouth every morning., Disp: , Rfl:    Calcium Carbonate Antacid (TUMS PO), Take 1 tablet by mouth as needed (heartburn)., Disp: , Rfl:    CALCIUM PO, Take 1 tablet by mouth every morning., Disp: , Rfl:    Cholecalciferol (VITAMIN D3) 50 MCG (2000 UT) capsule, Take 2,000 Units by mouth daily., Disp: , Rfl:    clonazePAM (KLONOPIN) 1 MG tablet, Take 1 mg by mouth at bedtime., Disp: , Rfl:    doxepin (SINEQUAN) 75 MG capsule, Take 75 mg by mouth at bedtime., Disp: , Rfl:    hyoscyamine (LEVSIN SL) 0.125 MG SL tablet, Place 0.25-0.5 mg under the tongue as needed for cramping., Disp: , Rfl:    LINZESS 290 MCG CAPS capsule, Take 290 mcg by mouth daily., Disp: , Rfl:    Magnesium Hydroxide (MILK OF MAGNESIA PO), Take 1 Dose by mouth as needed (constipation)., Disp: , Rfl:    Multiple Vitamin (MULTIVITAMIN WITH MINERALS) TABS tablet, Take 1 tablet by mouth every morning., Disp: , Rfl:    Multiple Vitamins-Minerals (ZINC PO), Take 1 tablet by mouth daily., Disp: , Rfl:    NIACIN PO, Take 1 tablet by mouth every morning.,  Disp: , Rfl:    ondansetron (ZOFRAN-ODT) 8 MG disintegrating tablet, Take 8 mg by mouth daily as needed for nausea or vomiting., Disp: , Rfl:    pantoprazole (PROTONIX) 40 MG tablet, Take 80 mg by mouth 2 (two) times daily., Disp: , Rfl:    polyethylene glycol (MIRALAX / GLYCOLAX) 17 g packet, Take 17 g by mouth 2 (two) times daily. (Patient taking differently: Take 17 g by mouth daily as needed for moderate constipation.), Disp: 14 each, Rfl: 0   potassium chloride SA (KLOR-CON M) 20 MEQ tablet, Take 2 tablets (40 mEq total) by mouth daily. (Patient taking differently: Take 40 mEq by mouth in the morning, at noon, in the evening, and at bedtime.), Disp: 20 tablet, Rfl: 0  traZODone (DESYREL) 50 MG tablet, Take 50-100 mg by mouth at bedtime as needed for sleep., Disp: , Rfl:    vortioxetine HBr (TRINTELLIX) 20 MG TABS tablet, Take 20 mg by mouth daily., Disp: , Rfl:    ferrous sulfate 325 (65 FE) MG EC tablet, Take 1 tablet (325 mg total) by mouth daily with breakfast. (Patient not taking: Reported on 06/06/2022), Disp: 30 tablet, Rfl: 3   sucralfate (CARAFATE) 1 g tablet, Take 1 g by mouth 4 (four) times daily. (Patient not taking: Reported on 06/06/2022), Disp: , Rfl:   Allergies: Allergies  Allergen Reactions   Amoxicillin Rash   Bactrim [Sulfamethoxazole-Trimethoprim] Rash    Rash noted to chest and back   Zithromax [Azithromycin] Rash       Objective  Vital signs:  Temp:  [97.7 F (36.5 C)-98.8 F (37.1 C)] 97.8 F (36.6 C) (10/11 1100) Pulse Rate:  [78-103] 80 (10/11 1100) Resp:  [14-32] 20 (10/11 0900) BP: (92-122)/(62-79) 109/69 (10/11 0336) SpO2:  [95 %-100 %] 100 % (10/11 1100) Weight:  [54.7 kg] 54.7 kg (10/10 2152)  Psychiatric Specialty Exam:  Presentation  General Appearance: Appropriate for Environment; Casual  Eye Contact:Good  Speech:Clear and Coherent; Normal Rate  Speech Volume:Normal  Handedness:No data recorded  Mood and Affect  Mood:--  ("good")  Affect:-- (aloof)   Thought Process  Thought Processes:Coherent; Goal Directed; Linear  Descriptions of Associations:Intact  Orientation:Full (Time, Place and Person)  Thought Content:Logical  History of Schizophrenia/Schizoaffective disorder:No data recorded Duration of Psychotic Symptoms:No data recorded Hallucinations:Hallucinations: None  Ideas of Reference:None  Suicidal Thoughts:Suicidal Thoughts: No  Homicidal Thoughts:Homicidal Thoughts: No   Sensorium  Memory:Immediate Good; Recent Good; Remote Good  Judgment:Fair  Insight:Fair   Executive Functions  Concentration:Fair  Attention Span:Fair  [place] recorded  Psychomotor Activity  Psychomotor Activity:Psychomotor Activity: Normal   Assets  Assets:Communication Skills; Desire for Improvement; Housing; Intimacy; Physical Health; Social Support   Sleep  Sleep:Sleep: Poor    Physical Exam: Physical Exam ROS Blood pressure 109/69, pulse 80, temperature 97.8 F (36.6 C), temperature source Oral, resp. rate 20, height 5' 4.75" (1.645 m), weight 54.7 kg, SpO2 100 %. Body mass index is 20.22 kg/m.

## 2022-06-08 NOTE — Discharge Summary (Addendum)
Physician Discharge Summary   Patient: Emily Park MRN: [id number] DOB: 1971-06-11  Admit date:     06/06/2022  Discharge date: 06/07/22  Discharge Physician: Alma Friendly   PCP: [name], MD   Recommendations at discharge:   Follow-up with PCP in 1 week  Discharge Diagnoses: Principal Problem:   Acute encephalopathy Active Problems:   Attention deficit hyperactivity disorder (ADHD)   Acute renal failure superimposed on stage 2 chronic kidney disease (HCC)   Anxiety   Mild episode of recurrent major depressive disorder (HCC)   High anion gap metabolic acidosis    Hospital Course: Patient admitted for confusion and speech difficulty. Work-up including CTA of the head/neck and MRI of brain have been negative.  Neurology consulted.    Today, patient very eager to be discharged, denies any new complaints.    Assessment and Plan:  Acute encephalopathy-resolved Likely 2/2 polypharmacy complicated by AKI/metabolic acidosis No acute findings on MRI brain, UDS positive for benzodiazepines (prescribed), EtOH undetectable, no infectious s/s  EEG unremarkable Neurology consulted, appreciate recs PCP follow-up  Elevated anion gap metabolic acidosis  Resolved PCP/outpatient nephrology follow-up   AKI superimposed on CKD II  SCr is 1.71 on admission, up from baseline closer to 1.1  S/p IV fluids Follows with outpatient nephrologist  Dr. Posey Pronto  Hypokalemia Replace as needed  Lactic acidosis Downtrending Outpatient follow-up with PCP for repeat  Leukocytosis Currently afebrile, no signs of infection Downtrending Outpatient follow-up with PCP   Depression, anxiety, ADD  Psychiatry consulted, appreciate recs Restart PTA meds     Pain control - [facility] Controlled Substance Reporting System database was reviewed. and patient was instructed, not to drive, operate heavy machinery, perform activities at heights, swimming or participation in water  activities or provide baby-sitting services while on Pain, Sleep and Anxiety Medications; until their outpatient Physician has advised to do so again. Also recommended to not to take more than prescribed Pain, Sleep and Anxiety Medications.    Consultants: Neurology, psychiatry Procedures performed: None Disposition: Home Diet recommendation:  Cardiac diet   DISCHARGE MEDICATION: Allergies as of 06/07/2022       Reactions   Amoxicillin Rash   Bactrim [sulfamethoxazole-trimethoprim] Rash   Rash noted to chest and back   Zithromax [azithromycin] Rash        Medication List     STOP taking these medications    amphetamine-dextroamphetamine 30 MG tablet Commonly known as: ADDERALL   promethazine 25 MG tablet Commonly known as: PHENERGAN   topiramate 50 MG tablet Commonly known as: TOPAMAX       TAKE these medications    acetaminophen 500 MG tablet Commonly known as: TYLENOL Take 1,000 mg by mouth daily as needed for headache.   CALCIUM PO Take 1 tablet by mouth every morning.   clonazePAM 1 MG tablet Commonly known as: KLONOPIN Take 1 mg by mouth at bedtime.   doxepin 75 MG capsule Commonly known as: SINEQUAN Take 75 mg by mouth at bedtime.   ferrous sulfate 325 (65 FE) MG EC tablet Take 1 tablet (325 mg total) by mouth daily with breakfast.   hyoscyamine 0.125 MG SL tablet Commonly known as: LEVSIN SL Place 0.25-0.5 mg under the tongue as needed for cramping.   Linzess 290 MCG Caps capsule Generic drug: linaclotide Take 290 mcg by mouth daily.   MILK OF MAGNESIA PO Take 1 Dose by mouth as needed (constipation).   multivitamin with minerals Tabs tablet Take 1 tablet by mouth  every morning.   NIACIN PO Take 1 tablet by mouth every morning.   ondansetron 8 MG disintegrating tablet Commonly known as: ZOFRAN-ODT Take 8 mg by mouth daily as needed for nausea or vomiting.   pantoprazole 40 MG tablet Commonly known as: PROTONIX Take 80 mg by  mouth 2 (two) times daily.   polyethylene glycol 17 g packet Commonly known as: MIRALAX / GLYCOLAX Take 17 g by mouth 2 (two) times daily. What changed:  when to take this reasons to take this   potassium chloride SA 20 MEQ tablet Commonly known as: KLOR-CON M Take 2 tablets (40 mEq total) by mouth daily. What changed: when to take this   sucralfate 1 g tablet Commonly known as: CARAFATE Take 1 g by mouth 4 (four) times daily.   traZODone 50 MG tablet Commonly known as: DESYREL Take 50-100 mg by mouth at bedtime as needed for sleep.   TUMS PO Take 1 tablet by mouth as needed (heartburn).   VITAMIN C PO Take 1 tablet by mouth every morning.   Vitamin D3 50 MCG (2000 UT) capsule Take 2,000 Units by mouth daily.   vortioxetine HBr 20 MG Tabs tablet Commonly known as: TRINTELLIX Take 20 mg by mouth daily.   ZINC PO Take 1 tablet by mouth daily.        Follow-up Information     [name], MD. Schedule an appointment as soon as possible for a visit in 1 week(s).   Specialty: Internal Medicine Contact information: [place] [address] [phone]                Discharge Exam: Danley Danker Weights   06/06/22 0100 06/06/22 0819 06/06/22 2152  Weight: 60 kg 60 kg 54.7 kg   General: NAD  Cardiovascular: S1, S2 present Respiratory: CTAB Abdomen: Soft, nontender, nondistended, bowel sounds present Musculoskeletal: No bilateral pedal edema noted Skin: Normal Psychiatry: Normal mood   Condition at discharge: stable  The results of significant diagnostics from this hospitalization (including imaging, microbiology, ancillary and laboratory) are listed below for reference.   Imaging Studies: DG CHEST PORT 1 VIEW  Result Date: 06/06/2022 CLINICAL DATA:  Dyspnea. EXAM: PORTABLE CHEST 1 VIEW COMPARISON:  Chest x-ray July 06, 2021. FINDINGS: The heart size and mediastinal contours are within normal limits. Both lungs are clear. No visible  pleural effusions or pneumothorax. No acute osseous abnormality. IMPRESSION: No active disease. Electronically Signed   By: Margaretha Sheffield M.D.   On: 06/06/2022 11:27   EEG adult  Result Date: 06/06/2022 Lora Havens, MD     06/06/2022 11:04 AM Patient Name: Emily Park MRN: [id number] Epilepsy Attending: Lora Havens Referring Physician/Provider: Vianne Bulls, MD Date: 06/06/2022 Duration: 24.04 mins Patient history: 51 year old female with altered mental status.  EEG to evaluate for seizure. Level of alertness: Awake AEDs during EEG study: None Technical aspects: This EEG study was done with scalp electrodes positioned according to the 10-20 International system of electrode placement. Electrical activity was reviewed with band pass filter of 1-'70Hz'$ , sensitivity of 7 uV/mm, display speed of 36m/sec with a '60Hz'$  notched filter applied as appropriate. EEG data were recorded continuously and digitally stored.  Video monitoring was available and reviewed as appropriate. Description: EEG showed continuous generalized mixed frequencies with predominantly 5-'9Hz'$  theta-alpha activity admixed with 2-'3Hz'$  intermittent delta slowing. Hyperventilation and photic stimulation were not performed.    ABNORMALITY - Continuous slow, generalized  IMPRESSION: This study is suggestive of moderate  diffuse encephalopathy, nonspecific etiology. No seizures or epileptiform discharges were seen throughout the recording. Lora Havens   MR BRAIN WO CONTRAST  Result Date: 06/06/2022 CLINICAL DATA:  Acute neurologic deficit EXAM: MRI HEAD WITHOUT CONTRAST TECHNIQUE: Multiplanar, multiecho pulse sequences of the brain and surrounding structures were obtained without intravenous contrast. COMPARISON:  None Available. FINDINGS: Due to patient altered mental status, only diffusion-weighted imaging could be obtained. There is no acute infarct. IMPRESSION: No acute infarct. Electronically Signed   By: Ulyses Jarred M.D.    On: 06/06/2022 02:22   CT ANGIO HEAD NECK W WO CM (CODE STROKE)  Result Date: 06/06/2022 CLINICAL DATA:  Acute neurologic deficit EXAM: CT ANGIOGRAPHY HEAD AND NECK TECHNIQUE: Multidetector CT imaging of the head and neck was performed using the standard protocol during bolus administration of intravenous contrast. Multiplanar CT image reconstructions and MIPs were obtained to evaluate the vascular anatomy. Carotid stenosis measurements (when applicable) are obtained utilizing NASCET criteria, using the distal internal carotid diameter as the denominator. RADIATION DOSE REDUCTION: This exam was performed according to the departmental dose-optimization program which includes automated exposure control, adjustment of the mA and/or kV according to patient size and/or use of iterative reconstruction technique. CONTRAST:  54m OMNIPAQUE IOHEXOL 350 MG/ML SOLN COMPARISON:  None Available. FINDINGS: CTA NECK FINDINGS SKELETON: There is no bony spinal canal stenosis. No lytic or blastic lesion. OTHER NECK: Normal pharynx, larynx and major salivary glands. No cervical lymphadenopathy. Unremarkable thyroid gland. UPPER CHEST: No pneumothorax or pleural effusion. No nodules or masses. AORTIC ARCH: There is no calcific atherosclerosis of the aortic arch. There is no aneurysm, dissection or hemodynamically significant stenosis of the visualized portion of the aorta. Conventional 3 vessel aortic branching pattern. The visualized proximal subclavian arteries are widely patent. RIGHT CAROTID SYSTEM: Normal without aneurysm, dissection or stenosis. LEFT CAROTID SYSTEM: Normal without aneurysm, dissection or stenosis. VERTEBRAL ARTERIES: Left dominant configuration. Both origins are clearly patent. There is no dissection, occlusion or flow-limiting stenosis to the skull base (V1-V3 segments). CTA HEAD FINDINGS POSTERIOR CIRCULATION: --Vertebral arteries: Normal V4 segments. --Inferior cerebellar arteries: Normal. --Basilar  artery: Normal. --Superior cerebellar arteries: Normal. --Posterior cerebral arteries (PCA): Normal. ANTERIOR CIRCULATION: --Intracranial internal carotid arteries: Normal. --Anterior cerebral arteries (ACA): Normal. Both A1 segments are present. Patent anterior communicating artery (a-comm). --Middle cerebral arteries (MCA): Normal. VENOUS SINUSES: As permitted by contrast timing, patent. ANATOMIC VARIANTS: None Review of the MIP images confirms the above findings. IMPRESSION: Normal CTA of the head and neck. Electronically Signed   By: KUlyses JarredM.D.   On: 06/06/2022 01:42   CT HEAD CODE STROKE WO CONTRAST  Addendum Date: 06/06/2022   ADDENDUM REPORT: 06/06/2022 01:25 ADDENDUM: These results were communicated to Dr. SDonnetta Simpersat 1:17 am on 06/06/2022 by text page via the [facility] Incm[facility]aging system. Electronically Signed   By: KUlyses JarredM.D.   On: 06/06/2022 01:25   Result Date: 06/06/2022 CLINICAL DATA:  Code stroke.  Acute neurologic deficit EXAM: CT HEAD WITHOUT CONTRAST TECHNIQUE: Contiguous axial images were obtained from the base of the skull through the vertex without intravenous contrast. RADIATION DOSE REDUCTION: This exam was performed according to the departmental dose-optimization program which includes automated exposure control, adjustment of the mA and/or kV according to patient size and/or use of iterative reconstruction technique. COMPARISON:  12/15/2007 FINDINGS: Brain: There is no mass, hemorrhage or extra-axial collection. The size and configuration of the ventricles and extra-axial CSF spaces are normal. The brain parenchyma is normal, without  evidence of acute or chronic infarction. Vascular: No abnormal hyperdensity of the major intracranial arteries or dural venous sinuses. No intracranial atherosclerosis. Skull: The visualized skull base, calvarium and extracranial soft tissues are normal. Sinuses/Orbits: No fluid levels or advanced mucosal thickening of the visualized  paranasal sinuses. No mastoid or middle ear effusion. The orbits are normal. ASPECTS [facility] Stroke Program Early CT Score) - Ganglionic level infarction (caudate, lentiform nuclei, internal capsule, insula, M1-M3 cortex): 7 - Supraganglionic infarction (M4-M6 cortex): 3 Total score (0-10 with 10 being normal): 10 IMPRESSION: 1. No acute intracranial abnormality. 2. ASPECTS is 10. Electronically Signed: By: Ulyses Jarred M.D. On: 06/06/2022 01:19   US RENAL  Result Date: 05/30/2022 CLINICAL DATA:  Elevated creatinine.  Iron deficiency anemia. EXAM: RENAL / URINARY TRACT ULTRASOUND COMPLETE COMPARISON:  None Available. FINDINGS: Right Kidney: Renal measurements: 10.0 x 3.5 x 3.6 cm = volume: 66.2 mL. Echogenicity within normal limits. No mass or hydronephrosis visualized. Left Kidney: Renal measurements: 10.3 x 4.2 x 4.1 cm = volume: 93.2 mL. Echogenicity within normal limits. No mass or hydronephrosis visualized. Bladder: The bladder was not visualized. Other: Multiple fluid-filled nondilated loops of small bowel are identified in the pelvis. IMPRESSION: 1. The kidneys are normal in appearance. 2. The bladder was not visualized. 3. Multiple fluid-filled nondilated loops of small bowel are identified in the pelvis. Electronically Signed   By: Dorise Bullion III M.D.   On: 05/30/2022 13:58    Microbiology: Results for orders placed or performed during the hospital encounter of 06/06/22  Culture, blood (Routine X 2) w Reflex to ID Panel     Status: None (Preliminary result)   Collection Time: 06/06/22  2:04 PM   Specimen: BLOOD  Result Value Ref Range Status   Specimen Description BLOOD LEFT ANTECUBITAL  Final   Special Requests   Final    BOTTLES DRAWN AEROBIC AND ANAEROBIC Blood Culture adequate volume   Culture   Final    NO GROWTH 2 DAYS Performed at [facility], [place] [address] [place], [place] [address]    Report Status PENDING  Incomplete  Culture, blood (Routine X 2) w Reflex to ID  Panel     Status: None (Preliminary result)   Collection Time: 06/06/22  2:04 PM   Specimen: BLOOD  Result Value Ref Range Status   Specimen Description BLOOD RIGHT ANTECUBITAL  Final   Special Requests   Final    BOTTLES DRAWN AEROBIC AND ANAEROBIC Blood Culture results may not be optimal due to an excessive volume of blood received in culture bottles   Culture   Final    NO GROWTH 2 DAYS Performed at [facility], [place] [address] [place], [place] [address]    Report Status PENDING  Incomplete  SARS Coronavirus 2 by RT PCR (hospital order, performed in [facility] hospital lab) *cepheid single result test* Anterior Nasal Swab     Status: None   Collection Time: 06/06/22  5:58 PM   Specimen: Anterior Nasal Swab  Result Value Ref Range Status   SARS Coronavirus 2 by RT PCR NEGATIVE NEGATIVE Final    Comment: (NOTE) SARS-CoV-2 target nucleic acids are NOT DETECTED.  The SARS-CoV-2 RNA is generally detectable in upper and lower respiratory specimens during the acute phase of infection. The lowest concentration of SARS-CoV-2 viral copies this assay can detect is 250 copies / mL. A negative result does not preclude SARS-CoV-2 infection and should not be used as the sole basis  for treatment or other patient management decisions.  A negative result may occur with improper specimen collection / handling, submission of specimen other than nasopharyngeal swab, presence of viral mutation(s) within the areas targeted by this assay, and inadequate number of viral copies (<250 copies / mL). A negative result must be combined with clinical observations, patient history, and epidemiological information.  Fact Sheet for Patients:   https://www.patel.info/  Fact Sheet for Healthcare Providers: https://hall.com/  This test is not yet approved or  cleared by the [place] FDA and has been authorized for detection and/or diagnosis of SARS-CoV-2  by FDA under an Emergency Use Authorization (EUA).  This EUA will remain in effect (meaning this test can be used) for the duration of the COVID-19 declaration under Section 564(b)(1) of the Act, 21 U.S.C. section 360bbb-3(b)(1), unless the authorization is terminated or revoked sooner.  Performed at [facility], [place] [address] [place], [place] [address]     Labs: CBC: Recent Labs  Lab 06/06/22 0110 06/06/22 0205 06/06/22 0608 06/06/22 1145 06/07/22 0226  WBC  --  11.3*  --  17.2* 11.4*  NEUTROABS  --  8.5*  --  14.3*  --   HGB 14.6 12.5 12.6 11.8* 10.5*  HCT 43.0 39.5 37.0 35.5* 30.4*  MCV  --  100.5*  --  97.0 92.4  PLT  --  305  --  291 176   Basic Metabolic Panel: Recent Labs  Lab 06/06/22 0110 06/06/22 0205 06/06/22 0608 06/06/22 1050 06/06/22 1950 06/07/22 0226  NA 148* 143 148* 145 140 139  K 3.6 3.6 3.7 3.1* 3.3* 3.1*  CL 110 108  --  118* 110 109  CO2  --  12*  --  13* 20* 23  GLUCOSE 93 84  --  72 115* 112*  BUN 13 12  --  '11 8 9  '$ CREATININE 1.20* 1.71*  --  1.67* 1.56* 1.47*  CALCIUM  --  9.8  --  9.4 8.7* 8.5*  MG  --   --   --  2.6*  --  2.1   Liver Function Tests: Recent Labs  Lab 06/06/22 0205 06/06/22 1050  AST 18 21  ALT 14 15  ALKPHOS 55 50  BILITOT 0.3 0.3  PROT 7.3 7.0  ALBUMIN 3.7 3.7   CBG: Recent Labs  Lab 06/06/22 0107  GLUCAP 96    Discharge time spent: less than 30 minutes.  Signed: Alma Friendly, MD [facility] 06/08/2022

## 2022-06-11 LAB — CULTURE, BLOOD (ROUTINE X 2)
Culture: NO GROWTH
Culture: NO GROWTH
Special Requests: ADEQUATE

## 2022-06-13 ENCOUNTER — Inpatient Hospital Stay: Payer: Commercial Managed Care - PPO | Admitting: Physician Assistant

## 2022-06-13 ENCOUNTER — Inpatient Hospital Stay: Payer: Commercial Managed Care - PPO | Attending: Physician Assistant

## 2022-09-25 ENCOUNTER — Ambulatory Visit: Payer: Commercial Managed Care - PPO | Admitting: Internal Medicine

## 2022-10-03 ENCOUNTER — Ambulatory Visit: Payer: Commercial Managed Care - PPO | Admitting: Internal Medicine

## 2022-10-03 ENCOUNTER — Encounter: Payer: Self-pay | Admitting: Internal Medicine

## 2022-10-03 VITALS — BP 98/70 | HR 77 | Temp 98.0°F | Ht 64.75 in | Wt 120.0 lb

## 2022-10-03 DIAGNOSIS — N182 Chronic kidney disease, stage 2 (mild): Secondary | ICD-10-CM

## 2022-10-03 DIAGNOSIS — R682 Dry mouth, unspecified: Secondary | ICD-10-CM | POA: Insufficient documentation

## 2022-10-03 DIAGNOSIS — R231 Pallor: Secondary | ICD-10-CM | POA: Diagnosis not present

## 2022-10-03 DIAGNOSIS — D508 Other iron deficiency anemias: Secondary | ICD-10-CM | POA: Diagnosis not present

## 2022-10-03 DIAGNOSIS — R946 Abnormal results of thyroid function studies: Secondary | ICD-10-CM

## 2022-10-03 DIAGNOSIS — K589 Irritable bowel syndrome without diarrhea: Secondary | ICD-10-CM | POA: Insufficient documentation

## 2022-10-03 DIAGNOSIS — G9340 Encephalopathy, unspecified: Secondary | ICD-10-CM

## 2022-10-03 DIAGNOSIS — K581 Irritable bowel syndrome with constipation: Secondary | ICD-10-CM | POA: Diagnosis not present

## 2022-10-03 DIAGNOSIS — N179 Acute kidney failure, unspecified: Secondary | ICD-10-CM | POA: Diagnosis not present

## 2022-10-03 DIAGNOSIS — R112 Nausea with vomiting, unspecified: Secondary | ICD-10-CM

## 2022-10-03 DIAGNOSIS — R197 Diarrhea, unspecified: Secondary | ICD-10-CM

## 2022-10-03 DIAGNOSIS — K623 Rectal prolapse: Secondary | ICD-10-CM

## 2022-10-03 DIAGNOSIS — I471 Supraventricular tachycardia, unspecified: Secondary | ICD-10-CM

## 2022-10-03 DIAGNOSIS — R571 Hypovolemic shock: Secondary | ICD-10-CM

## 2022-10-03 LAB — URINALYSIS
Bilirubin Urine: NEGATIVE
Hgb urine dipstick: NEGATIVE
Ketones, ur: NEGATIVE
Leukocytes,Ua: NEGATIVE
Nitrite: NEGATIVE
Specific Gravity, Urine: 1.025 (ref 1.000–1.030)
Total Protein, Urine: NEGATIVE
Urine Glucose: NEGATIVE
Urobilinogen, UA: 0.2 (ref 0.0–1.0)
pH: 6 (ref 5.0–8.0)

## 2022-10-03 LAB — COMPREHENSIVE METABOLIC PANEL
ALT: 10 U/L (ref 0–35)
AST: 20 U/L (ref 0–37)
Albumin: 4.5 g/dL (ref 3.5–5.2)
Alkaline Phosphatase: 63 U/L (ref 39–117)
BUN: 14 mg/dL (ref 6–23)
CO2: 25 mEq/L (ref 19–32)
Calcium: 9.4 mg/dL (ref 8.4–10.5)
Chloride: 99 mEq/L (ref 96–112)
Creatinine, Ser: 1.09 mg/dL (ref 0.40–1.20)
GFR: 58.22 mL/min — ABNORMAL LOW (ref >60.00)
Glucose, Bld: 83 mg/dL (ref 70–99)
Potassium: 3.4 mEq/L — ABNORMAL LOW (ref 3.5–5.1)
Sodium: 137 mEq/L (ref 135–145)
Total Bilirubin: 0.2 mg/dL (ref 0.2–1.2)
Total Protein: 7.6 g/dL (ref 6.0–8.3)

## 2022-10-03 LAB — CBC WITH DIFFERENTIAL/PLATELET
Basophils Absolute: 0 10*3/uL (ref 0.0–0.1)
Basophils Relative: 0.8 % (ref 0.0–3.0)
Eosinophils Absolute: 0.3 10*3/uL (ref 0.0–0.7)
Eosinophils Relative: 5.3 % — ABNORMAL HIGH (ref 0.0–5.0)
HCT: 37 % (ref 36.0–46.0)
Hemoglobin: 12.9 g/dL (ref 12.0–15.0)
Lymphocytes Relative: 42.7 % (ref 12.0–46.0)
Lymphs Abs: 2.5 10*3/uL (ref 0.7–4.0)
MCHC: 34.8 g/dL (ref 30.0–36.0)
MCV: 93.3 fl (ref 78.0–100.0)
Monocytes Absolute: 0.5 10*3/uL (ref 0.1–1.0)
Monocytes Relative: 7.8 % (ref 3.0–12.0)
Neutro Abs: 2.6 10*3/uL (ref 1.4–7.7)
Neutrophils Relative %: 43.4 % (ref 43.0–77.0)
Platelets: 318 10*3/uL (ref 150.0–400.0)
RBC: 3.97 Mil/uL (ref 3.87–5.11)
RDW: 13 % (ref 11.5–15.5)
WBC: 5.9 10*3/uL (ref 4.0–10.5)

## 2022-10-03 LAB — MAGNESIUM: Magnesium: 2 mg/dL (ref 1.5–2.5)

## 2022-10-03 LAB — T4, FREE: Free T4: 4.94 ng/dL — ABNORMAL HIGH (ref 0.60–1.60)

## 2022-10-03 LAB — T3, FREE: T3, Free: 12.9 pg/mL — ABNORMAL HIGH (ref 2.3–4.2)

## 2022-10-03 LAB — C-REACTIVE PROTEIN: CRP: <1 mg/dL (ref 0.5–20.0)

## 2022-10-03 LAB — CORTISOL: Cortisol, Plasma: 8.4 ug/dL

## 2022-10-03 LAB — TSH: TSH: 0.67 u[IU]/mL (ref 0.35–5.50)

## 2022-10-03 LAB — VITAMIN D 25 HYDROXY (VIT D DEFICIENCY, FRACTURES): VITD: 54.48 ng/mL (ref 30.00–100.00)

## 2022-10-03 LAB — SEDIMENTATION RATE: Sed Rate: 16 mm/hr (ref 0–30)

## 2022-10-03 LAB — VITAMIN B12: Vitamin B-12: 652 pg/mL (ref 211–911)

## 2022-10-03 LAB — LIPASE: Lipase: 24 U/L (ref 11.0–59.0)

## 2022-10-03 NOTE — Assessment & Plan Note (Addendum)
Acute on chronic On Zofran 8 mg tid prn

## 2022-10-03 NOTE — Assessment & Plan Note (Signed)
Check CMET. 

## 2022-10-03 NOTE — Assessment & Plan Note (Signed)
Remote Check FT4, FT3, TSH; labs

## 2022-10-03 NOTE — Assessment & Plan Note (Addendum)
Due to meds side effects most likely R/o CTD ANA SS-A, SS-B R/o Sjongrens ESR, CRP

## 2022-10-03 NOTE — Assessment & Plan Note (Signed)
Check labs 

## 2022-10-03 NOTE — Assessment & Plan Note (Signed)
Remote - 2021

## 2022-10-03 NOTE — Assessment & Plan Note (Addendum)
New on the left thigh.  Unclear etiology Due to meds versus connective tissue disease versus other? Will obtain : ANA SS-A, SS-B R/o Sjongrens ESR, CRP

## 2022-10-03 NOTE — Progress Notes (Unsigned)
Subjective:  Patient ID: Emily Park, female    DOB: Aug 30, 1970  Age: 52 y.o. MRN: [id number]  CC: No chief complaint on file.   HPI Emily Park presents for a new pt C/o IBS C/o septic shock 2 years ago: Since then Emily Park has developed a lot of problems including rectal prolapse (corrected), chronic IBS, chronic weakness, tremors, episodic confusion She is complaining of a new discoloration to the rash on her left thigh  Outpatient Medications Prior to Visit  Medication Sig Dispense Refill   acetaminophen (TYLENOL) 500 MG tablet Take 1,000 mg by mouth daily as needed for headache.     Ascorbic Acid (VITAMIN C PO) Take 1 tablet by mouth every morning.     CALCIUM PO Take 1 tablet by mouth every morning.     Cholecalciferol (VITAMIN D3) 50 MCG (2000 UT) capsule Take 2,000 Units by mouth daily.     clonazePAM (KLONOPIN) 1 MG tablet Take 1 mg by mouth at bedtime.     doxepin (SINEQUAN) 75 MG capsule Take 75 mg by mouth at bedtime.     ferrous sulfate 325 (65 FE) MG EC tablet Take 1 tablet (325 mg total) by mouth daily with breakfast. 30 tablet 3   hyoscyamine (LEVSIN SL) 0.125 MG SL tablet Place 0.25-0.5 mg under the tongue as needed for cramping.     LINZESS 290 MCG CAPS capsule Take 290 mcg by mouth daily.     Magnesium Hydroxide (MILK OF MAGNESIA PO) Take 1 Dose by mouth as needed (constipation).     Multiple Vitamin (MULTIVITAMIN WITH MINERALS) TABS tablet Take 1 tablet by mouth every morning.     Multiple Vitamins-Minerals (ZINC PO) Take 1 tablet by mouth daily.     NIACIN PO Take 1 tablet by mouth every morning.     ondansetron (ZOFRAN-ODT) 8 MG disintegrating tablet Take 8 mg by mouth daily as needed for nausea or vomiting.     pantoprazole (PROTONIX) 40 MG tablet Take 80 mg by mouth 2 (two) times daily.     polyethylene glycol (MIRALAX / GLYCOLAX) 17 g packet Take 17 g by mouth 2 (two) times daily. (Patient taking differently: Take 17 g by mouth daily as needed for  moderate constipation.) 14 each 0   potassium chloride SA (KLOR-CON M) 20 MEQ tablet Take 2 tablets (40 mEq total) by mouth daily. (Patient taking differently: Take 40 mEq by mouth in the morning, at noon, in the evening, and at bedtime.) 20 tablet 0   vortioxetine HBr (TRINTELLIX) 20 MG TABS tablet Take 20 mg by mouth daily.     XIFAXAN 550 MG TABS tablet Take 550 mg by mouth 3 (three) times daily.     Calcium Carbonate Antacid (TUMS PO) Take 1 tablet by mouth as needed (heartburn).     sucralfate (CARAFATE) 1 g tablet Take 1 g by mouth 4 (four) times daily. (Patient not taking: Reported on 06/06/2022)     traZODone (DESYREL) 50 MG tablet Take 50-100 mg by mouth at bedtime as needed for sleep.     No facility-administered medications prior to visit.    ROS: Review of Systems  Constitutional:  Positive for fatigue. Negative for activity change, appetite change, chills and unexpected weight change.  HENT:  Negative for congestion, mouth sores and sinus pressure.   Eyes:  Negative for visual disturbance.  Respiratory:  Negative for cough and chest tightness.   Gastrointestinal:  Positive for abdominal distention, constipation, nausea and vomiting. Negative for  abdominal pain.  Genitourinary:  Negative for difficulty urinating, frequency and vaginal pain.  Musculoskeletal:  Negative for back pain and gait problem.  Skin:  Positive for color change. Negative for pallor and rash.  Neurological:  Positive for weakness. Negative for dizziness, tremors, numbness and headaches.  Hematological:  Negative for adenopathy.  Psychiatric/Behavioral:  Positive for decreased concentration and dysphoric mood. Negative for behavioral problems, confusion, sleep disturbance and suicidal ideas. The patient is nervous/anxious.     Objective:  BP 98/70 (BP Location: Left Arm, Patient Position: Sitting, Cuff Size: Large)   Pulse 77   Temp 98 F (36.7 C) (Oral)   Ht 5' 4.75" (1.645 m)   Wt 120 lb (54.4 kg)    SpO2 98%   BMI 20.12 kg/m   BP Readings from Last 3 Encounters:  10/03/22 98/70  06/07/22 109/69  03/10/22 113/65    Wt Readings from Last 3 Encounters:  10/03/22 120 lb (54.4 kg)  06/06/22 120 lb 9.5 oz (54.7 kg)  03/03/22 124 lb 1.6 oz (56.3 kg)    Physical Exam Constitutional:      General: She is not in acute distress.    Appearance: Normal appearance. She is well-developed.  HENT:     Head: Normocephalic.     Right Ear: External ear normal.     Left Ear: External ear normal.     Nose: Nose normal.  Eyes:     General:        Right eye: No discharge.        Left eye: No discharge.     Conjunctiva/sclera: Conjunctivae normal.     Pupils: Pupils are equal, round, and reactive to light.  Neck:     Thyroid: No thyromegaly.     Vascular: No JVD.     Trachea: No tracheal deviation.  Cardiovascular:     Rate and Rhythm: Normal rate and regular rhythm.     Heart sounds: Normal heart sounds.  Pulmonary:     Effort: No respiratory distress.     Breath sounds: No stridor. No wheezing.  Abdominal:     General: Bowel sounds are normal. There is no distension.     Palpations: Abdomen is soft. There is no mass.     Tenderness: There is no abdominal tenderness. There is no guarding or rebound.  Musculoskeletal:        General: No tenderness.     Cervical back: Normal range of motion and neck supple. No rigidity.  Lymphadenopathy:     Cervical: No cervical adenopathy.  Skin:    Findings: No erythema or rash.  Neurological:     Mental Status: She is oriented to person, place, and time.     Cranial Nerves: No cranial nerve deficit.     Motor: No weakness or abnormal muscle tone.     Coordination: Coordination abnormal.     Deep Tendon Reflexes: Reflexes normal.  Psychiatric:        Behavior: Behavior normal.        Thought Content: Thought content normal.        Judgment: Judgment normal.   Dry mouth Livedo reticularis on the  L thigh Hyperreflexia Tremors    A  total time of 65 minutes was spent preparing to see the patient, reviewing tests, x-rays, operative reports and other medical records.  Also, obtaining history and performing comprehensive physical exam.  Additionally, counseling the patient regarding the above listed issues.   Finally, documenting clinical information in the health  records, coordination of care.  It is a complex case.   Lab Results  Component Value Date   WBC 5.9 10/03/2022   HGB 12.9 10/03/2022   HCT 37.0 10/03/2022   PLT 318.0 10/03/2022   GLUCOSE 83 10/03/2022   CHOL 208 (H) 02/24/2014   TRIG 81.0 02/24/2014   HDL 54.90 02/24/2014   LDLCALC 137 (H) 02/24/2014   ALT 10 10/03/2022   AST 20 10/03/2022   NA 137 10/03/2022   K 3.4 (L) 10/03/2022   CL 99 10/03/2022   CREATININE 1.09 10/03/2022   BUN 14 10/03/2022   CO2 25 10/03/2022   TSH 0.67 10/03/2022   INR 1.0 06/06/2022   HGBA1C 4.9 07/06/2021    DG CHEST PORT 1 VIEW  Result Date: 06/06/2022 CLINICAL DATA:  Dyspnea. EXAM: PORTABLE CHEST 1 VIEW COMPARISON:  Chest x-ray July 06, 2021. FINDINGS: The heart size and mediastinal contours are within normal limits. Both lungs are clear. No visible pleural effusions or pneumothorax. No acute osseous abnormality. IMPRESSION: No active disease. Electronically Signed   By: Margaretha Sheffield M.D.   On: 06/06/2022 11:27   EEG adult  Result Date: 06/06/2022 Lora Havens, MD     06/06/2022 11:04 AM Patient Name: ZAARA SPROWL MRN: [id number] Epilepsy Attending: Lora Havens Referring Physician/Provider: Vianne Bulls, MD Date: 06/06/2022 Duration: 24.04 mins Patient history: 52 year old female with altered mental status.  EEG to evaluate for seizure. Level of alertness: Awake AEDs during EEG study: None Technical aspects: This EEG study was done with scalp electrodes positioned according to the 10-20 International system of electrode placement. Electrical activity was reviewed with band pass filter of 1-'70Hz'$ ,  sensitivity of 7 uV/mm, display speed of 96m/sec with a '60Hz'$  notched filter applied as appropriate. EEG data were recorded continuously and digitally stored.  Video monitoring was available and reviewed as appropriate. Description: EEG showed continuous generalized mixed frequencies with predominantly 5-'9Hz'$  theta-alpha activity admixed with 2-'3Hz'$  intermittent delta slowing. Hyperventilation and photic stimulation were not performed.    ABNORMALITY - Continuous slow, generalized  IMPRESSION: This study is suggestive of moderate diffuse encephalopathy, nonspecific etiology. No seizures or epileptiform discharges were seen throughout the recording. PLora Havens  MR BRAIN WO CONTRAST  Result Date: 06/06/2022 CLINICAL DATA:  Acute neurologic deficit EXAM: MRI HEAD WITHOUT CONTRAST TECHNIQUE: Multiplanar, multiecho pulse sequences of the brain and surrounding structures were obtained without intravenous contrast. COMPARISON:  None Available. FINDINGS: Due to patient altered mental status, only diffusion-weighted imaging could be obtained. There is no acute infarct. IMPRESSION: No acute infarct. Electronically Signed   By: KUlyses JarredM.D.   On: 06/06/2022 02:22   CT ANGIO HEAD NECK W WO CM (CODE STROKE)  Result Date: 06/06/2022 CLINICAL DATA:  Acute neurologic deficit EXAM: CT ANGIOGRAPHY HEAD AND NECK TECHNIQUE: Multidetector CT imaging of the head and neck was performed using the standard protocol during bolus administration of intravenous contrast. Multiplanar CT image reconstructions and MIPs were obtained to evaluate the vascular anatomy. Carotid stenosis measurements (when applicable) are obtained utilizing NASCET criteria, using the distal internal carotid diameter as the denominator. RADIATION DOSE REDUCTION: This exam was performed according to the departmental dose-optimization program which includes automated exposure control, adjustment of the mA and/or kV according to patient size and/or use  of iterative reconstruction technique. CONTRAST:  665mOMNIPAQUE IOHEXOL 350 MG/ML SOLN COMPARISON:  None Available. FINDINGS: CTA NECK FINDINGS SKELETON: There is no bony spinal canal stenosis. No lytic or blastic lesion.  OTHER NECK: Normal pharynx, larynx and major salivary glands. No cervical lymphadenopathy. Unremarkable thyroid gland. UPPER CHEST: No pneumothorax or pleural effusion. No nodules or masses. AORTIC ARCH: There is no calcific atherosclerosis of the aortic arch. There is no aneurysm, dissection or hemodynamically significant stenosis of the visualized portion of the aorta. Conventional 3 vessel aortic branching pattern. The visualized proximal subclavian arteries are widely patent. RIGHT CAROTID SYSTEM: Normal without aneurysm, dissection or stenosis. LEFT CAROTID SYSTEM: Normal without aneurysm, dissection or stenosis. VERTEBRAL ARTERIES: Left dominant configuration. Both origins are clearly patent. There is no dissection, occlusion or flow-limiting stenosis to the skull base (V1-V3 segments). CTA HEAD FINDINGS POSTERIOR CIRCULATION: --Vertebral arteries: Normal V4 segments. --Inferior cerebellar arteries: Normal. --Basilar artery: Normal. --Superior cerebellar arteries: Normal. --Posterior cerebral arteries (PCA): Normal. ANTERIOR CIRCULATION: --Intracranial internal carotid arteries: Normal. --Anterior cerebral arteries (ACA): Normal. Both A1 segments are present. Patent anterior communicating artery (a-comm). --Middle cerebral arteries (MCA): Normal. VENOUS SINUSES: As permitted by contrast timing, patent. ANATOMIC VARIANTS: None Review of the MIP images confirms the above findings. IMPRESSION: Normal CTA of the head and neck. Electronically Signed   By: Ulyses Jarred M.D.   On: 06/06/2022 01:42   CT HEAD CODE STROKE WO CONTRAST  Addendum Date: 06/06/2022   ADDENDUM REPORT: 06/06/2022 01:25 ADDENDUM: These results were communicated to Dr. Donnetta Simpers at 1:17 am on 06/06/2022 by text  page via the [facility] messaging system. Electronically Signed   By: Ulyses Jarred M.D.   On: 06/06/2022 01:25   Result Date: 06/06/2022 CLINICAL DATA:  Code stroke.  Acute neurologic deficit EXAM: CT HEAD WITHOUT CONTRAST TECHNIQUE: Contiguous axial images were obtained from the base of the skull through the vertex without intravenous contrast. RADIATION DOSE REDUCTION: This exam was performed according to the departmental dose-optimization program which includes automated exposure control, adjustment of the mA and/or kV according to patient size and/or use of iterative reconstruction technique. COMPARISON:  12/15/2007 FINDINGS: Brain: There is no mass, hemorrhage or extra-axial collection. The size and configuration of the ventricles and extra-axial CSF spaces are normal. The brain parenchyma is normal, without evidence of acute or chronic infarction. Vascular: No abnormal hyperdensity of the major intracranial arteries or dural venous sinuses. No intracranial atherosclerosis. Skull: The visualized skull base, calvarium and extracranial soft tissues are normal. Sinuses/Orbits: No fluid levels or advanced mucosal thickening of the visualized paranasal sinuses. No mastoid or middle ear effusion. The orbits are normal. ASPECTS [facility] Stroke Program Early CT Score) - Ganglionic level infarction (caudate, lentiform nuclei, internal capsule, insula, M1-M3 cortex): 7 - Supraganglionic infarction (M4-M6 cortex): 3 Total score (0-10 with 10 being normal): 10 IMPRESSION: 1. No acute intracranial abnormality. 2. ASPECTS is 10. Electronically Signed: By: Ulyses Jarred M.D. On: 06/06/2022 01:19    Assessment & Plan:   Problem List Items Addressed This Visit       Cardiovascular and Mediastinum   Paroxysmal SVT (supraventricular tachycardia)    Remote Check FT4, FT3, TSH; labs      Livedo reticularis (Chronic)    New on the left thigh.  Unclear etiology Due to meds versus connective tissue disease versus  other? Will obtain : ANA SS-A, SS-B R/o Sjongrens ESR, CRP      Relevant Orders   Sjogrens syndrome-A extractable nuclear antibody   Sjogrens syndrome-B extractable nuclear antibody   CBC with Differential/Platelet (Completed)   Comprehensive metabolic panel (Completed)   C-reactive protein (Completed)   Iron, TIBC and Ferritin Panel   Lipase (Completed)  Magnesium (Completed)   Sedimentation rate (Completed)   T3, free (Completed)   T4, free (Completed)   TSH (Completed)   Urinalysis (Completed)   Vitamin B12 (Completed)   VITAMIN D 25 Hydroxy (Vit-D Deficiency, Fractures) (Completed)   Cortisol (Completed)   ANA     Digestive   Rectal prolapse    S/p repair      Intractable vomiting with nausea    Acute on chronic On Zofran 8 mg tid prn      IBS (irritable colon syndrome)    F/u w/Dr Lenny Pastel On Xifaxan po      Relevant Orders   Sjogrens syndrome-A extractable nuclear antibody   Sjogrens syndrome-B extractable nuclear antibody   CBC with Differential/Platelet (Completed)   Comprehensive metabolic panel (Completed)   C-reactive protein (Completed)   Iron, TIBC and Ferritin Panel   Lipase (Completed)   Magnesium (Completed)   Sedimentation rate (Completed)   T3, free (Completed)   T4, free (Completed)   TSH (Completed)   Urinalysis (Completed)   Vitamin B12 (Completed)   VITAMIN D 25 Hydroxy (Vit-D Deficiency, Fractures) (Completed)   Cortisol (Completed)   ANA   Dry mouth - Primary    Due to meds side effects most likely R/o CTD ANA SS-A, SS-B R/o Sjongrens ESR, CRP      Relevant Orders   Sjogrens syndrome-A extractable nuclear antibody   Sjogrens syndrome-B extractable nuclear antibody   CBC with Differential/Platelet (Completed)   Comprehensive metabolic panel (Completed)   C-reactive protein (Completed)   Iron, TIBC and Ferritin Panel   Lipase (Completed)   Magnesium (Completed)   Sedimentation rate (Completed)   T3, free  (Completed)   T4, free (Completed)   TSH (Completed)   Urinalysis (Completed)   Vitamin B12 (Completed)   VITAMIN D 25 Hydroxy (Vit-D Deficiency, Fractures) (Completed)   Cortisol (Completed)   ANA     Nervous and Auditory   Acute encephalopathy    Remote.  No relapse        Genitourinary   Acute renal failure superimposed on stage 2 chronic kidney disease (HCC)    Check CMET        Other   Thyroid function test abnormal    Elevated free T4 and free T3 with normal TSH.  This could be related to biotin use versus other problems.      IDA (iron deficiency anemia)    Check labs      Relevant Orders   Sjogrens syndrome-A extractable nuclear antibody   Sjogrens syndrome-B extractable nuclear antibody   CBC with Differential/Platelet (Completed)   Comprehensive metabolic panel (Completed)   C-reactive protein (Completed)   Iron, TIBC and Ferritin Panel   Lipase (Completed)   Magnesium (Completed)   Sedimentation rate (Completed)   T3, free (Completed)   T4, free (Completed)   TSH (Completed)   Urinalysis (Completed)   Vitamin B12 (Completed)   VITAMIN D 25 Hydroxy (Vit-D Deficiency, Fractures) (Completed)   Cortisol (Completed)   ANA   Hypovolemic shock (HCC)    Remote - 2021      Diarrhea    Current diarrhea.  On Xifaxan.         No orders of the defined types were placed in this encounter.     Follow-up: Return in about 4 weeks (around 10/31/2022) for a follow-up visit.  Walker Kehr, MD

## 2022-10-03 NOTE — Assessment & Plan Note (Signed)
F/u w/Dr Lenny Pastel On Xifaxan po

## 2022-10-03 NOTE — Assessment & Plan Note (Signed)
S/p repair

## 2022-10-04 DIAGNOSIS — R946 Abnormal results of thyroid function studies: Secondary | ICD-10-CM | POA: Insufficient documentation

## 2022-10-04 LAB — SJOGRENS SYNDROME-A EXTRACTABLE NUCLEAR ANTIBODY: SSA (Ro) (ENA) Antibody, IgG: <1 AI

## 2022-10-04 LAB — IRON,TIBC AND FERRITIN PANEL
%SAT: 13 % (calc) — ABNORMAL LOW (ref 16–45)
Ferritin: 18 ng/mL (ref 16–232)
Iron: 35 ug/dL — ABNORMAL LOW (ref 45–160)
TIBC: 276 mcg/dL (calc) (ref 250–450)

## 2022-10-04 LAB — ANA: Anti Nuclear Antibody (ANA): NEGATIVE

## 2022-10-04 LAB — SJOGRENS SYNDROME-B EXTRACTABLE NUCLEAR ANTIBODY: SSB (La) (ENA) Antibody, IgG: <1 AI

## 2022-10-04 NOTE — Assessment & Plan Note (Signed)
Elevated free T4 and free T3 with normal TSH.  This could be related to biotin use versus other problems.

## 2022-10-04 NOTE — Assessment & Plan Note (Signed)
Remote No relapse 

## 2022-10-04 NOTE — Assessment & Plan Note (Signed)
Current diarrhea.  On Xifaxan.

## 2022-10-04 NOTE — Progress Notes (Incomplete)
Subjective:  Patient ID: Emily Park, female    DOB: February 01, 1971  Age: 52 y.o. MRN: [id number]  CC: No chief complaint on file.   HPI Emily Park presents for a new pt C/o IBS - c C/o septic shock 2 years ago: Since then Emily Park has developed a lot of problems including rectal prolapse (corrected), chronic IBS, chronic weakness, tremors, episodic confusion She is complaining of a new discoloration to the rash on her left thigh  Outpatient Medications Prior to Visit  Medication Sig Dispense Refill  . acetaminophen (TYLENOL) 500 MG tablet Take 1,000 mg by mouth daily as needed for headache.    . Ascorbic Acid (VITAMIN C PO) Take 1 tablet by mouth every morning.    Marland Kitchen CALCIUM PO Take 1 tablet by mouth every morning.    . Cholecalciferol (VITAMIN D3) 50 MCG (2000 UT) capsule Take 2,000 Units by mouth daily.    . clonazePAM (KLONOPIN) 1 MG tablet Take 1 mg by mouth at bedtime.    Marland Kitchen doxepin (SINEQUAN) 75 MG capsule Take 75 mg by mouth at bedtime.    . ferrous sulfate 325 (65 FE) MG EC tablet Take 1 tablet (325 mg total) by mouth daily with breakfast. 30 tablet 3  . hyoscyamine (LEVSIN SL) 0.125 MG SL tablet Place 0.25-0.5 mg under the tongue as needed for cramping.    Marland Kitchen LINZESS 290 MCG CAPS capsule Take 290 mcg by mouth daily.    . Magnesium Hydroxide (MILK OF MAGNESIA PO) Take 1 Dose by mouth as needed (constipation).    . Multiple Vitamin (MULTIVITAMIN WITH MINERALS) TABS tablet Take 1 tablet by mouth every morning.    . Multiple Vitamins-Minerals (ZINC PO) Take 1 tablet by mouth daily.    Marland Kitchen NIACIN PO Take 1 tablet by mouth every morning.    . ondansetron (ZOFRAN-ODT) 8 MG disintegrating tablet Take 8 mg by mouth daily as needed for nausea or vomiting.    . pantoprazole (PROTONIX) 40 MG tablet Take 80 mg by mouth 2 (two) times daily.    . polyethylene glycol (MIRALAX / GLYCOLAX) 17 g packet Take 17 g by mouth 2 (two) times daily. (Patient taking differently: Take 17 g by mouth daily  as needed for moderate constipation.) 14 each 0  . potassium chloride SA (KLOR-CON M) 20 MEQ tablet Take 2 tablets (40 mEq total) by mouth daily. (Patient taking differently: Take 40 mEq by mouth in the morning, at noon, in the evening, and at bedtime.) 20 tablet 0  . vortioxetine HBr (TRINTELLIX) 20 MG TABS tablet Take 20 mg by mouth daily.    Marland Kitchen XIFAXAN 550 MG TABS tablet Take 550 mg by mouth 3 (three) times daily.    . Calcium Carbonate Antacid (TUMS PO) Take 1 tablet by mouth as needed (heartburn).    . sucralfate (CARAFATE) 1 g tablet Take 1 g by mouth 4 (four) times daily. (Patient not taking: Reported on 06/06/2022)    . traZODone (DESYREL) 50 MG tablet Take 50-100 mg by mouth at bedtime as needed for sleep.     No facility-administered medications prior to visit.    ROS: Review of Systems  Constitutional:  Positive for fatigue. Negative for activity change, appetite change, chills and unexpected weight change.  HENT:  Negative for congestion, mouth sores and sinus pressure.   Eyes:  Negative for visual disturbance.  Respiratory:  Negative for cough and chest tightness.   Gastrointestinal:  Positive for abdominal distention, constipation, nausea and vomiting.  Negative for abdominal pain.  Genitourinary:  Negative for difficulty urinating, frequency and vaginal pain.  Musculoskeletal:  Negative for back pain and gait problem.  Skin:  Positive for color change. Negative for pallor and rash.  Neurological:  Positive for weakness. Negative for dizziness, tremors, numbness and headaches.  Hematological:  Negative for adenopathy.  Psychiatric/Behavioral:  Positive for decreased concentration and dysphoric mood. Negative for behavioral problems, confusion, sleep disturbance and suicidal ideas. The patient is nervous/anxious.     Objective:  BP 98/70 (BP Location: Left Arm, Patient Position: Sitting, Cuff Size: Large)   Pulse 77   Temp 98 F (36.7 C) (Oral)   Ht 5' 4.75" (1.645 m)   Wt  120 lb (54.4 kg)   SpO2 98%   BMI 20.12 kg/m   BP Readings from Last 3 Encounters:  10/03/22 98/70  06/07/22 109/69  03/10/22 113/65    Wt Readings from Last 3 Encounters:  10/03/22 120 lb (54.4 kg)  06/06/22 120 lb 9.5 oz (54.7 kg)  03/03/22 124 lb 1.6 oz (56.3 kg)    Physical Exam Constitutional:      General: She is not in acute distress.    Appearance: Normal appearance. She is well-developed.  HENT:     Head: Normocephalic.     Right Ear: External ear normal.     Left Ear: External ear normal.     Nose: Nose normal.  Eyes:     General:        Right eye: No discharge.        Left eye: No discharge.     Conjunctiva/sclera: Conjunctivae normal.     Pupils: Pupils are equal, round, and reactive to light.  Neck:     Thyroid: No thyromegaly.     Vascular: No JVD.     Trachea: No tracheal deviation.  Cardiovascular:     Rate and Rhythm: Normal rate and regular rhythm.     Heart sounds: Normal heart sounds.  Pulmonary:     Effort: No respiratory distress.     Breath sounds: No stridor. No wheezing.  Abdominal:     General: Bowel sounds are normal. There is no distension.     Palpations: Abdomen is soft. There is no mass.     Tenderness: There is no abdominal tenderness. There is no guarding or rebound.  Musculoskeletal:        General: No tenderness.     Cervical back: Normal range of motion and neck supple. No rigidity.  Lymphadenopathy:     Cervical: No cervical adenopathy.  Skin:    Findings: No erythema or rash.  Neurological:     Mental Status: She is oriented to person, place, and time.     Cranial Nerves: No cranial nerve deficit.     Motor: No weakness or abnormal muscle tone.     Coordination: Coordination abnormal.     Deep Tendon Reflexes: Reflexes normal.  Psychiatric:        Behavior: Behavior normal.        Thought Content: Thought content normal.        Judgment: Judgment normal.   Dry mouth Livedo reticularis on the  L  thigh Hyperreflexia Tremors    A total time of 65 minutes was spent preparing to see the patient, reviewing tests, x-rays, operative reports and other medical records.  Also, obtaining history and performing comprehensive physical exam.  Additionally, counseling the patient regarding the above listed issues.   Finally, documenting clinical information in  the health records, coordination of care.  It is a complex case.   Lab Results  Component Value Date   WBC 11.4 (H) 06/07/2022   HGB 10.5 (L) 06/07/2022   HCT 30.4 (L) 06/07/2022   PLT 251 06/07/2022   GLUCOSE 112 (H) 06/07/2022   CHOL 208 (H) 02/24/2014   TRIG 81.0 02/24/2014   HDL 54.90 02/24/2014   LDLCALC 137 (H) 02/24/2014   ALT 15 06/06/2022   AST 21 06/06/2022   NA 139 06/07/2022   K 3.1 (L) 06/07/2022   CL 109 06/07/2022   CREATININE 1.47 (H) 06/07/2022   BUN 9 06/07/2022   CO2 23 06/07/2022   TSH 0.367 06/06/2022   INR 1.0 06/06/2022   HGBA1C 4.9 07/06/2021    DG CHEST PORT 1 VIEW  Result Date: 06/06/2022 CLINICAL DATA:  Dyspnea. EXAM: PORTABLE CHEST 1 VIEW COMPARISON:  Chest x-ray July 06, 2021. FINDINGS: The heart size and mediastinal contours are within normal limits. Both lungs are clear. No visible pleural effusions or pneumothorax. No acute osseous abnormality. IMPRESSION: No active disease. Electronically Signed   By: Margaretha Sheffield M.D.   On: 06/06/2022 11:27   EEG adult  Result Date: 06/06/2022 Lora Havens, MD     06/06/2022 11:04 AM Patient Name: ELYSIA GRAND MRN: [id number] Epilepsy Attending: Lora Havens Referring Physician/Provider: Vianne Bulls, MD Date: 06/06/2022 Duration: 24.04 mins Patient history: 52 year old female with altered mental status.  EEG to evaluate for seizure. Level of alertness: Awake AEDs during EEG study: None Technical aspects: This EEG study was done with scalp electrodes positioned according to the 10-20 International system of electrode placement. Electrical  activity was reviewed with band pass filter of 1-'70Hz'$ , sensitivity of 7 uV/mm, display speed of 55m/sec with a '60Hz'$  notched filter applied as appropriate. EEG data were recorded continuously and digitally stored.  Video monitoring was available and reviewed as appropriate. Description: EEG showed continuous generalized mixed frequencies with predominantly 5-'9Hz'$  theta-alpha activity admixed with 2-'3Hz'$  intermittent delta slowing. Hyperventilation and photic stimulation were not performed.    ABNORMALITY - Continuous slow, generalized  IMPRESSION: This study is suggestive of moderate diffuse encephalopathy, nonspecific etiology. No seizures or epileptiform discharges were seen throughout the recording. PLora Havens  MR BRAIN WO CONTRAST  Result Date: 06/06/2022 CLINICAL DATA:  Acute neurologic deficit EXAM: MRI HEAD WITHOUT CONTRAST TECHNIQUE: Multiplanar, multiecho pulse sequences of the brain and surrounding structures were obtained without intravenous contrast. COMPARISON:  None Available. FINDINGS: Due to patient altered mental status, only diffusion-weighted imaging could be obtained. There is no acute infarct. IMPRESSION: No acute infarct. Electronically Signed   By: KUlyses JarredM.D.   On: 06/06/2022 02:22   CT ANGIO HEAD NECK W WO CM (CODE STROKE)  Result Date: 06/06/2022 CLINICAL DATA:  Acute neurologic deficit EXAM: CT ANGIOGRAPHY HEAD AND NECK TECHNIQUE: Multidetector CT imaging of the head and neck was performed using the standard protocol during bolus administration of intravenous contrast. Multiplanar CT image reconstructions and MIPs were obtained to evaluate the vascular anatomy. Carotid stenosis measurements (when applicable) are obtained utilizing NASCET criteria, using the distal internal carotid diameter as the denominator. RADIATION DOSE REDUCTION: This exam was performed according to the departmental dose-optimization program which includes automated exposure control, adjustment  of the mA and/or kV according to patient size and/or use of iterative reconstruction technique. CONTRAST:  654mOMNIPAQUE IOHEXOL 350 MG/ML SOLN COMPARISON:  None Available. FINDINGS: CTA NECK FINDINGS SKELETON: There is no bony spinal  canal stenosis. No lytic or blastic lesion. OTHER NECK: Normal pharynx, larynx and major salivary glands. No cervical lymphadenopathy. Unremarkable thyroid gland. UPPER CHEST: No pneumothorax or pleural effusion. No nodules or masses. AORTIC ARCH: There is no calcific atherosclerosis of the aortic arch. There is no aneurysm, dissection or hemodynamically significant stenosis of the visualized portion of the aorta. Conventional 3 vessel aortic branching pattern. The visualized proximal subclavian arteries are widely patent. RIGHT CAROTID SYSTEM: Normal without aneurysm, dissection or stenosis. LEFT CAROTID SYSTEM: Normal without aneurysm, dissection or stenosis. VERTEBRAL ARTERIES: Left dominant configuration. Both origins are clearly patent. There is no dissection, occlusion or flow-limiting stenosis to the skull base (V1-V3 segments). CTA HEAD FINDINGS POSTERIOR CIRCULATION: --Vertebral arteries: Normal V4 segments. --Inferior cerebellar arteries: Normal. --Basilar artery: Normal. --Superior cerebellar arteries: Normal. --Posterior cerebral arteries (PCA): Normal. ANTERIOR CIRCULATION: --Intracranial internal carotid arteries: Normal. --Anterior cerebral arteries (ACA): Normal. Both A1 segments are present. Patent anterior communicating artery (a-comm). --Middle cerebral arteries (MCA): Normal. VENOUS SINUSES: As permitted by contrast timing, patent. ANATOMIC VARIANTS: None Review of the MIP images confirms the above findings. IMPRESSION: Normal CTA of the head and neck. Electronically Signed   By: Ulyses Jarred M.D.   On: 06/06/2022 01:42   CT HEAD CODE STROKE WO CONTRAST  Addendum Date: 06/06/2022   ADDENDUM REPORT: 06/06/2022 01:25 ADDENDUM: These results were communicated to  Dr. Donnetta Simpers at 1:17 am on 06/06/2022 by text page via the [facility] messaging system. Electronically Signed   By: Ulyses Jarred M.D.   On: 06/06/2022 01:25   Result Date: 06/06/2022 CLINICAL DATA:  Code stroke.  Acute neurologic deficit EXAM: CT HEAD WITHOUT CONTRAST TECHNIQUE: Contiguous axial images were obtained from the base of the skull through the vertex without intravenous contrast. RADIATION DOSE REDUCTION: This exam was performed according to the departmental dose-optimization program which includes automated exposure control, adjustment of the mA and/or kV according to patient size and/or use of iterative reconstruction technique. COMPARISON:  12/15/2007 FINDINGS: Brain: There is no mass, hemorrhage or extra-axial collection. The size and configuration of the ventricles and extra-axial CSF spaces are normal. The brain parenchyma is normal, without evidence of acute or chronic infarction. Vascular: No abnormal hyperdensity of the major intracranial arteries or dural venous sinuses. No intracranial atherosclerosis. Skull: The visualized skull base, calvarium and extracranial soft tissues are normal. Sinuses/Orbits: No fluid levels or advanced mucosal thickening of the visualized paranasal sinuses. No mastoid or middle ear effusion. The orbits are normal. ASPECTS [facility] Stroke Program Early CT Score) - Ganglionic level infarction (caudate, lentiform nuclei, internal capsule, insula, M1-M3 cortex): 7 - Supraganglionic infarction (M4-M6 cortex): 3 Total score (0-10 with 10 being normal): 10 IMPRESSION: 1. No acute intracranial abnormality. 2. ASPECTS is 10. Electronically Signed: By: Ulyses Jarred M.D. On: 06/06/2022 01:19    Assessment & Plan:   Problem List Items Addressed This Visit       Cardiovascular and Mediastinum   Livedo reticularis (Chronic)    LLE Due to meds? ANA SS-A, SS-B R/o Sjongrens ESR, CRP      Relevant Orders   Sjogrens syndrome-A extractable nuclear antibody    Sjogrens syndrome-B extractable nuclear antibody   CBC with Differential/Platelet   Comprehensive metabolic panel   C-reactive protein   Iron, TIBC and Ferritin Panel   Lipase   Magnesium   Sedimentation rate   T3, free   T4, free   TSH   Urinalysis   Vitamin B12   VITAMIN D 25 Hydroxy (Vit-D  Deficiency, Fractures)   Cortisol   ANA   Paroxysmal SVT (supraventricular tachycardia)    Remote Check FT4, FT3, TSH; labs        Digestive   Dry mouth - Primary    Due to meds side effects most likely R/o CTD ANA SS-A, SS-B R/o Sjongrens ESR, CRP      Relevant Orders   Sjogrens syndrome-A extractable nuclear antibody   Sjogrens syndrome-B extractable nuclear antibody   CBC with Differential/Platelet   Comprehensive metabolic panel   C-reactive protein   Iron, TIBC and Ferritin Panel   Lipase   Magnesium   Sedimentation rate   T3, free   T4, free   TSH   Urinalysis   Vitamin B12   VITAMIN D 25 Hydroxy (Vit-D Deficiency, Fractures)   Cortisol   ANA   IBS (irritable colon syndrome)    F/u w/Dr Lenny Pastel On Xifaxan po      Relevant Orders   Sjogrens syndrome-A extractable nuclear antibody   Sjogrens syndrome-B extractable nuclear antibody   CBC with Differential/Platelet   Comprehensive metabolic panel   C-reactive protein   Iron, TIBC and Ferritin Panel   Lipase   Magnesium   Sedimentation rate   T3, free   T4, free   TSH   Urinalysis   Vitamin B12   VITAMIN D 25 Hydroxy (Vit-D Deficiency, Fractures)   Cortisol   ANA   Intractable vomiting with nausea    Acute on chronic On Zofran 8 mg tid prn      Rectal prolapse    S/p repair        Genitourinary   Acute renal failure superimposed on stage 2 chronic kidney disease (HCC)    Check CMET        Other   Hypovolemic shock (HCC)    Remote - 2021      IDA (iron deficiency anemia)    Check labs      Relevant Orders   Sjogrens syndrome-A extractable nuclear antibody   Sjogrens  syndrome-B extractable nuclear antibody   CBC with Differential/Platelet   Comprehensive metabolic panel   C-reactive protein   Iron, TIBC and Ferritin Panel   Lipase   Magnesium   Sedimentation rate   T3, free   T4, free   TSH   Urinalysis   Vitamin B12   VITAMIN D 25 Hydroxy (Vit-D Deficiency, Fractures)   Cortisol   ANA      No orders of the defined types were placed in this encounter.     Follow-up: Return in about 4 weeks (around 10/31/2022) for a follow-up visit.  Walker Kehr, MD

## 2022-10-20 ENCOUNTER — Encounter: Payer: Self-pay | Admitting: Internal Medicine

## 2022-10-26 ENCOUNTER — Other Ambulatory Visit: Payer: Self-pay | Admitting: Internal Medicine

## 2022-10-26 MED ORDER — ACCRUFER 30 MG PO CAPS: ORAL_CAPSULE | ORAL | 5 refills | Status: DC

## 2022-11-03 ENCOUNTER — Ambulatory Visit: Payer: Commercial Managed Care - PPO | Admitting: Internal Medicine

## 2022-11-06 ENCOUNTER — Ambulatory Visit: Payer: Commercial Managed Care - PPO | Admitting: Family Medicine

## 2022-11-20 ENCOUNTER — Ambulatory Visit: Payer: Commercial Managed Care - PPO | Admitting: Internal Medicine

## 2022-11-22 ENCOUNTER — Encounter: Payer: Self-pay | Admitting: Internal Medicine

## 2022-11-22 ENCOUNTER — Ambulatory Visit (INDEPENDENT_AMBULATORY_CARE_PROVIDER_SITE_OTHER): Payer: Commercial Managed Care - PPO | Admitting: Internal Medicine

## 2022-11-22 VITALS — BP 112/82 | HR 79 | Temp 98.0°F | Ht 64.75 in | Wt 125.0 lb

## 2022-11-22 DIAGNOSIS — R682 Dry mouth, unspecified: Secondary | ICD-10-CM | POA: Diagnosis not present

## 2022-11-22 DIAGNOSIS — D509 Iron deficiency anemia, unspecified: Secondary | ICD-10-CM | POA: Diagnosis not present

## 2022-11-22 DIAGNOSIS — F909 Attention-deficit hyperactivity disorder, unspecified type: Secondary | ICD-10-CM | POA: Diagnosis not present

## 2022-11-22 DIAGNOSIS — G43909 Migraine, unspecified, not intractable, without status migrainosus: Secondary | ICD-10-CM

## 2022-11-22 DIAGNOSIS — E876 Hypokalemia: Secondary | ICD-10-CM

## 2022-11-22 DIAGNOSIS — D508 Other iron deficiency anemias: Secondary | ICD-10-CM

## 2022-11-22 MED ORDER — LINACLOTIDE 145 MCG PO CAPS: 145.0000 ug | ORAL_CAPSULE | ORAL | 3 refills | Status: DC

## 2022-11-22 NOTE — Patient Instructions (Signed)
  Gluten free trial for 4-6 weeks. OK to use gluten-free bread and gluten-free pasta.    Gluten-Free Diet for Celiac Disease, Adult The gluten-free diet includes all foods that do not contain gluten. Gluten is a protein that is found in wheat, rye, barley, and some other grains. Following the gluten-free diet is the only treatment for people with celiac disease. It helps to prevent damage to the intestines and improves or eliminates the symptoms of celiac disease. Following the gluten-free diet requires some planning. It can be challenging at first, but it gets easier with time and practice. There are more gluten-free options available today than ever before. If you need help finding gluten-free foods or if you have questions, talk with your diet and nutrition specialist (registered dietitian) or your health care provider. What do I need to know about a gluten-free diet?  All fruits, vegetables, and meats are safe to eat and do not contain gluten.  When grocery shopping, start by shopping in the produce, meat, and dairy sections. These sections are more likely to contain gluten-free foods. Then move to the aisles that contain packaged foods if you need to.  Read all food labels. Gluten is often added to foods. Always check the ingredient list and look for warnings, such as "may contain gluten."  Talk with your dietitian or health care provider before taking a gluten-free multivitamin or mineral supplement.  Be aware of gluten-free foods having contact with foods that contain gluten (cross-contamination). This can happen at home and with any processed foods. ? Talk with your health care provider or dietitian about how to reduce the risk of cross-contamination in your home. ? If you have questions about how a food is processed, ask the manufacturer. What key words help to identify gluten? Foods that list any of these key words on the label usually contain gluten:  Wheat, flour, enriched  flour, bromated flour, white flour, durum flour, graham flour, phosphated flour, self-rising flour, semolina, farina, barley (malt), rye, and oats.  Starch, dextrin, modified food starch, or cereal.  Thickening, fillers, or emulsifiers.  Malt flavoring, malt extract, or malt syrup.  Hydrolyzed vegetable protein.  In the [place] packaged foods that are gluten-free are required to be labeled "GF." These foods should be easy to identify and are safe to eat. In the [place] food companies are also required to list common food allergens, including wheat, on their labels. Recommended foods Grains  Amaranth, bean flours, 100% buckwheat flour, corn, millet, nut flours or nut meals, GF oats, quinoa, rice, sorghum, teff, rice wafers, pure cornmeal tortillas, popcorn, and hot cereals made from cornmeal. Hominy, rice, wild rice. Some Asian rice noodles or bean noodles. Arrowroot starch, corn bran, corn flour, corn germ, cornmeal, corn starch, potato flour, potato starch flour, and rice bran. Plain, brown, and sweet rice flours. Rice polish, soy flour, and tapioca starch. Vegetables  All plain fresh, frozen, and canned vegetables. Fruits  All plain fresh, frozen, canned, and dried fruits, and 100% fruit juices. Meats and other protein foods  All fresh beef, pork, poultry, fish, seafood, and eggs. Fish canned in water, oil, brine, or vegetable broth. Plain nuts and seeds, peanut butter. Some lunch meat and some frankfurters. Dried beans, dried peas, and lentils. Dairy  Fresh plain, dry, evaporated, or condensed milk. Cream, butter, sour cream, whipping cream, and most yogurts. Unprocessed cheese, most processed cheeses, some cottage cheese, some cream cheeses. Beverages  Coffee, tea, most herbal teas. Carbonated beverages and some root beers.   Wine, sake, and distilled spirits, such as gin, vodka, and whiskey. Most hard ciders. Fats and oils  Butter, margarine, vegetable oil, hydrogenated butter, olive  oil, shortening, lard, cream, and some mayonnaise. Some commercial salad dressings. Olives. Sweets and desserts  Sugar, honey, some syrups, molasses, jelly, and jam. Plain hard candy, marshmallows, and gumdrops. Pure cocoa powder. Plain chocolate. Custard and some pudding mixes. Gelatin desserts, sorbets, frozen ice pops, and sherbet. Cake, cookies, and other desserts prepared with allowed flours. Some commercial ice creams. Cornstarch, tapioca, and rice puddings. Seasoning and other foods  Some canned or frozen soups. Monosodium glutamate (MSG). Cider, rice, and wine vinegar. Baking soda and baking powder. Cream of tartar. Baking and nutritional yeast. Certain soy sauces made without wheat (ask your dietitian about specific brands that are allowed). Nuts, coconut, and chocolate. Salt, pepper, herbs, spices, flavoring extracts, imitation or artificial flavorings, natural flavorings, and food colorings. Some medicines and supplements. Some lip glosses and other cosmetics. Rice syrups. The items listed may not be a complete list. Talk with your dietitian about what dietary choices are best for you. Foods to avoid Grains  Barley, bran, bulgur, couscous, cracked wheat, Gorman, farro, graham, malt, matzo, semolina, wheat germ, and all wheat and rye cereals including spelt and kamut. Cereals containing malt as a flavoring, such as rice cereal. Noodles, spaghetti, macaroni, most packaged rice mixes, and all mixes containing wheat, rye, barley, or triticale. Vegetables  Most creamed vegetables and most vegetables canned in sauces. Some commercially prepared vegetables and salads. Fruits  Thickened or prepared fruits and some pie fillings. Some fruit snacks and fruit roll-ups. Meats and other protein foods  Any meat or meat alternative containing wheat, rye, barley, or gluten stabilizers. These are often marinated or packaged meats and lunch meats. Bread-containing products, such as Swiss steak,  croquettes, meatballs, and meatloaf. Most tuna canned in vegetable broth and turkey with hydrolyzed vegetable protein (HVP) injected as part of the basting. Seitan. Imitation fish. Eggs in sauces made from ingredients to avoid. Dairy  Commercial chocolate milk drinks and malted milk. Some non-dairy creamers. Any cheese product containing ingredients to avoid. Beverages  Certain cereal beverages. Beer, ale, malted milk, and some root beers. Some hard ciders. Some instant flavored coffees. Some herbal teas made with barley or with barley malt added. Fats and oils  Some commercial salad dressings. Sour cream containing modified food starch. Sweets and desserts  Some toffees. Chocolate-coated nuts (may be rolled in wheat flour) and some commercial candies and candy bars. Most cakes, cookies, donuts, pastries, and other baked goods. Some commercial ice cream. Ice cream cones. Commercially prepared mixes for cakes, cookies, and other desserts. Bread pudding and other puddings thickened with flour. Products containing brown rice syrup made with barley malt enzyme. Desserts and sweets made with malt flavoring. Seasoning and other foods  Some curry powders, some dry seasoning mixes, some gravy extracts, some meat sauces, some ketchups, some prepared mustards, and horseradish. Certain soy sauces. Malt vinegar. Bouillon and bouillon cubes that contain HVP. Some chip dips, and some chewing gum. Yeast extract. Brewer's yeast. Caramel color. Some medicines and supplements. Some lip glosses and other cosmetics. The items listed may not be a complete list. Talk with your dietitian about what dietary choices are best for you. Summary  Gluten is a protein that is found in wheat, rye, barley, and some other grains. The gluten-free diet includes all foods that do not contain gluten.  If you need help finding gluten-free foods or if   you have questions, talk with your diet and nutrition specialist (registered  dietitian) or your health care provider.  Read all food labels. Gluten is often added to foods. Always check the ingredient list and look for warnings, such as "may contain gluten." This information is not intended to replace advice given to you by your health care provider. Make sure you discuss any questions you have with your health care provider. Document Released: 08/14/2005 Document Revised: 05/29/2016 Document Reviewed: 05/29/2016 Elsevier Interactive Patient Education  2018 [employer]   

## 2022-11-22 NOTE — Assessment & Plan Note (Signed)
On KCl 

## 2022-11-22 NOTE — Assessment & Plan Note (Signed)
Better. Off Topamax

## 2022-11-22 NOTE — Assessment & Plan Note (Signed)
On Accrufer

## 2022-11-22 NOTE — Assessment & Plan Note (Signed)
Dr Toy Care Take Adderall 30 mg 1/4 -1/2 tab

## 2022-11-22 NOTE — Progress Notes (Signed)
Subjective:  Patient ID: Emily Park, female    DOB: 10/23/1970  Age: 52 y.o. MRN: [id number]  CC: Medical Management of Chronic Issues (2 week f/u- Discuss labs)   HPI Emily Park presents for nausea, depression, IBS, dry mouth  Outpatient Medications Prior to Visit  Medication Sig Dispense Refill   acetaminophen (TYLENOL) 500 MG tablet Take 1,000 mg by mouth daily as needed for headache.     Ascorbic Acid (VITAMIN C PO) Take 1 tablet by mouth every morning.     CALCIUM PO Take 1 tablet by mouth every morning.     Cholecalciferol (VITAMIN D3) 50 MCG (2000 UT) capsule Take 2,000 Units by mouth daily.     clonazePAM (KLONOPIN) 1 MG tablet Take 1 mg by mouth at bedtime.     doxepin (SINEQUAN) 75 MG capsule Take 75 mg by mouth at bedtime.     Ferric Maltol (ACCRUFER) 30 MG CAPS 1 po bid 60 capsule 5   ferrous sulfate 325 (65 FE) MG EC tablet Take 1 tablet (325 mg total) by mouth daily with breakfast. 30 tablet 3   hyoscyamine (LEVSIN SL) 0.125 MG SL tablet Place 0.25-0.5 mg under the tongue as needed for cramping.     LINZESS 290 MCG CAPS capsule Take 290 mcg by mouth daily.     Magnesium Hydroxide (MILK OF MAGNESIA PO) Take 1 Dose by mouth as needed (constipation).     Multiple Vitamin (MULTIVITAMIN WITH MINERALS) TABS tablet Take 1 tablet by mouth every morning.     Multiple Vitamins-Minerals (ZINC PO) Take 1 tablet by mouth daily.     NIACIN PO Take 1 tablet by mouth every morning.     ondansetron (ZOFRAN-ODT) 8 MG disintegrating tablet Take 8 mg by mouth daily as needed for nausea or vomiting.     pantoprazole (PROTONIX) 40 MG tablet Take 80 mg by mouth 2 (two) times daily.     polyethylene glycol (MIRALAX / GLYCOLAX) 17 g packet Take 17 g by mouth 2 (two) times daily. (Patient taking differently: Take 17 g by mouth daily as needed for moderate constipation.) 14 each 0   potassium chloride SA (KLOR-CON M) 20 MEQ tablet Take 2 tablets (40 mEq total) by mouth daily. (Patient  taking differently: Take 40 mEq by mouth in the morning, at noon, in the evening, and at bedtime.) 20 tablet 0   vortioxetine HBr (TRINTELLIX) 20 MG TABS tablet Take 20 mg by mouth daily.     XIFAXAN 550 MG TABS tablet Take 550 mg by mouth 3 (three) times daily.     No facility-administered medications prior to visit.    ROS: Review of Systems  Constitutional:  Negative for activity change, appetite change, chills, fatigue and unexpected weight change.  HENT:  Negative for congestion, mouth sores and sinus pressure.   Eyes:  Negative for visual disturbance.  Respiratory:  Negative for cough and chest tightness.   Gastrointestinal:  Positive for constipation and nausea. Negative for abdominal pain.  Genitourinary:  Negative for difficulty urinating, frequency and vaginal pain.  Musculoskeletal:  Positive for arthralgias. Negative for back pain and gait problem.  Skin:  Positive for color change. Negative for pallor and rash.  Neurological:  Negative for dizziness, tremors, weakness, numbness and headaches.  Psychiatric/Behavioral:  Negative for confusion and sleep disturbance.     Objective:  BP 112/82 (BP Location: Left Arm)   Pulse 79   Temp 98 F (36.7 C) (Oral)   Ht 5' 4.75" (  1.645 m)   Wt 125 lb (56.7 kg)   SpO2 99%   BMI 20.96 kg/m   BP Readings from Last 3 Encounters:  11/22/22 112/82  10/03/22 98/70  06/07/22 109/69    Wt Readings from Last 3 Encounters:  11/22/22 125 lb (56.7 kg)  10/03/22 120 lb (54.4 kg)  06/06/22 120 lb 9.5 oz (54.7 kg)    Physical Exam Constitutional:      General: She is not in acute distress.    Appearance: Normal appearance. She is well-developed.  HENT:     Head: Normocephalic.     Right Ear: External ear normal.     Left Ear: External ear normal.     Nose: Nose normal.  Eyes:     General:        Right eye: No discharge.        Left eye: No discharge.     Conjunctiva/sclera: Conjunctivae normal.     Pupils: Pupils are equal,  round, and reactive to light.  Neck:     Thyroid: No thyromegaly.     Vascular: No JVD.     Trachea: No tracheal deviation.  Cardiovascular:     Rate and Rhythm: Normal rate and regular rhythm.     Heart sounds: Normal heart sounds.  Pulmonary:     Effort: No respiratory distress.     Breath sounds: No stridor. No wheezing.  Abdominal:     General: Bowel sounds are normal. There is no distension.     Palpations: Abdomen is soft. There is no mass.     Tenderness: There is no abdominal tenderness. There is no guarding or rebound.  Musculoskeletal:        General: No tenderness.     Cervical back: Normal range of motion and neck supple. No rigidity.  Lymphadenopathy:     Cervical: No cervical adenopathy.  Skin:    Findings: No erythema or rash.  Neurological:     Mental Status: She is oriented to person, place, and time.     Cranial Nerves: No cranial nerve deficit.     Motor: No abnormal muscle tone.     Coordination: Coordination normal.     Deep Tendon Reflexes: Reflexes normal.  Psychiatric:        Behavior: Behavior normal.        Thought Content: Thought content normal.        Judgment: Judgment normal.     Lab Results  Component Value Date   WBC 5.9 10/03/2022   HGB 12.9 10/03/2022   HCT 37.0 10/03/2022   PLT 318.0 10/03/2022   GLUCOSE 83 10/03/2022   CHOL 208 (H) 02/24/2014   TRIG 81.0 02/24/2014   HDL 54.90 02/24/2014   LDLCALC 137 (H) 02/24/2014   ALT 10 10/03/2022   AST 20 10/03/2022   NA 137 10/03/2022   K 3.4 (L) 10/03/2022   CL 99 10/03/2022   CREATININE 1.09 10/03/2022   BUN 14 10/03/2022   CO2 25 10/03/2022   TSH 0.67 10/03/2022   INR 1.0 06/06/2022   HGBA1C 4.9 07/06/2021    DG CHEST PORT 1 VIEW  Result Date: 06/06/2022 CLINICAL DATA:  Dyspnea. EXAM: PORTABLE CHEST 1 VIEW COMPARISON:  Chest x-ray July 06, 2021. FINDINGS: The heart size and mediastinal contours are within normal limits. Both lungs are clear. No visible pleural effusions or  pneumothorax. No acute osseous abnormality. IMPRESSION: No active disease. Electronically Signed   By: Margaretha Sheffield M.D.   On: 06/06/2022  11:27   EEG adult  Result Date: 06/06/2022 Lora Havens, MD     06/06/2022 11:04 AM Patient Name: Emily Park MRN: [id number] Epilepsy Attending: Lora Havens Referring Physician/Provider: Vianne Bulls, MD Date: 06/06/2022 Duration: 24.04 mins Patient history: 52 year old female with altered mental status.  EEG to evaluate for seizure. Level of alertness: Awake AEDs during EEG study: None Technical aspects: This EEG study was done with scalp electrodes positioned according to the 10-20 International system of electrode placement. Electrical activity was reviewed with band pass filter of 1-70Hz , sensitivity of 7 uV/mm, display speed of 26mm/sec with a 60Hz  notched filter applied as appropriate. EEG data were recorded continuously and digitally stored.  Video monitoring was available and reviewed as appropriate. Description: EEG showed continuous generalized mixed frequencies with predominantly 5-9Hz  theta-alpha activity admixed with 2-3Hz  intermittent delta slowing. Hyperventilation and photic stimulation were not performed.    ABNORMALITY - Continuous slow, generalized  IMPRESSION: This study is suggestive of moderate diffuse encephalopathy, nonspecific etiology. No seizures or epileptiform discharges were seen throughout the recording. Lora Havens   MR BRAIN WO CONTRAST  Result Date: 06/06/2022 CLINICAL DATA:  Acute neurologic deficit EXAM: MRI HEAD WITHOUT CONTRAST TECHNIQUE: Multiplanar, multiecho pulse sequences of the brain and surrounding structures were obtained without intravenous contrast. COMPARISON:  None Available. FINDINGS: Due to patient altered mental status, only diffusion-weighted imaging could be obtained. There is no acute infarct. IMPRESSION: No acute infarct. Electronically Signed   By: Ulyses Jarred M.D.   On: 06/06/2022  02:22   CT ANGIO HEAD NECK W WO CM (CODE STROKE)  Result Date: 06/06/2022 CLINICAL DATA:  Acute neurologic deficit EXAM: CT ANGIOGRAPHY HEAD AND NECK TECHNIQUE: Multidetector CT imaging of the head and neck was performed using the standard protocol during bolus administration of intravenous contrast. Multiplanar CT image reconstructions and MIPs were obtained to evaluate the vascular anatomy. Carotid stenosis measurements (when applicable) are obtained utilizing NASCET criteria, using the distal internal carotid diameter as the denominator. RADIATION DOSE REDUCTION: This exam was performed according to the departmental dose-optimization program which includes automated exposure control, adjustment of the mA and/or kV according to patient size and/or use of iterative reconstruction technique. CONTRAST:  27mL OMNIPAQUE IOHEXOL 350 MG/ML SOLN COMPARISON:  None Available. FINDINGS: CTA NECK FINDINGS SKELETON: There is no bony spinal canal stenosis. No lytic or blastic lesion. OTHER NECK: Normal pharynx, larynx and major salivary glands. No cervical lymphadenopathy. Unremarkable thyroid gland. UPPER CHEST: No pneumothorax or pleural effusion. No nodules or masses. AORTIC ARCH: There is no calcific atherosclerosis of the aortic arch. There is no aneurysm, dissection or hemodynamically significant stenosis of the visualized portion of the aorta. Conventional 3 vessel aortic branching pattern. The visualized proximal subclavian arteries are widely patent. RIGHT CAROTID SYSTEM: Normal without aneurysm, dissection or stenosis. LEFT CAROTID SYSTEM: Normal without aneurysm, dissection or stenosis. VERTEBRAL ARTERIES: Left dominant configuration. Both origins are clearly patent. There is no dissection, occlusion or flow-limiting stenosis to the skull base (V1-V3 segments). CTA HEAD FINDINGS POSTERIOR CIRCULATION: --Vertebral arteries: Normal V4 segments. --Inferior cerebellar arteries: Normal. --Basilar artery: Normal.  --Superior cerebellar arteries: Normal. --Posterior cerebral arteries (PCA): Normal. ANTERIOR CIRCULATION: --Intracranial internal carotid arteries: Normal. --Anterior cerebral arteries (ACA): Normal. Both A1 segments are present. Patent anterior communicating artery (a-comm). --Middle cerebral arteries (MCA): Normal. VENOUS SINUSES: As permitted by contrast timing, patent. ANATOMIC VARIANTS: None Review of the MIP images confirms the above findings. IMPRESSION: Normal CTA of the head and neck. Electronically  Signed   By: Ulyses Jarred M.D.   On: 06/06/2022 01:42   CT HEAD CODE STROKE WO CONTRAST  Addendum Date: 06/06/2022   ADDENDUM REPORT: 06/06/2022 01:25 ADDENDUM: These results were communicated to Dr. Donnetta Simpers at 1:17 am on 06/06/2022 by text page via the [facility] messaging system. Electronically Signed   By: Ulyses Jarred M.D.   On: 06/06/2022 01:25   Result Date: 06/06/2022 CLINICAL DATA:  Code stroke.  Acute neurologic deficit EXAM: CT HEAD WITHOUT CONTRAST TECHNIQUE: Contiguous axial images were obtained from the base of the skull through the vertex without intravenous contrast. RADIATION DOSE REDUCTION: This exam was performed according to the departmental dose-optimization program which includes automated exposure control, adjustment of the mA and/or kV according to patient size and/or use of iterative reconstruction technique. COMPARISON:  12/15/2007 FINDINGS: Brain: There is no mass, hemorrhage or extra-axial collection. The size and configuration of the ventricles and extra-axial CSF spaces are normal. The brain parenchyma is normal, without evidence of acute or chronic infarction. Vascular: No abnormal hyperdensity of the major intracranial arteries or dural venous sinuses. No intracranial atherosclerosis. Skull: The visualized skull base, calvarium and extracranial soft tissues are normal. Sinuses/Orbits: No fluid levels or advanced mucosal thickening of the visualized paranasal  sinuses. No mastoid or middle ear effusion. The orbits are normal. ASPECTS [facility] Stroke Program Early CT Score) - Ganglionic level infarction (caudate, lentiform nuclei, internal capsule, insula, M1-M3 cortex): 7 - Supraganglionic infarction (M4-M6 cortex): 3 Total score (0-10 with 10 being normal): 10 IMPRESSION: 1. No acute intracranial abnormality. 2. ASPECTS is 10. Electronically Signed: By: Ulyses Jarred M.D. On: 06/06/2022 01:19    Assessment & Plan:   Problem List Items Addressed This Visit       Cardiovascular and Mediastinum   Migraine    Better. Off Topamax          Digestive   Dry mouth     Other   Anemia, iron deficiency    On Accrufer      Relevant Orders   CBC with Differential/Platelet   Iron, TIBC and Ferritin Panel   Attention deficit hyperactivity disorder (ADHD)    Dr Toy Care Take Adderall 30 mg 1/4 -1/2 tab      Hypokalemia - Primary    On KCl      Relevant Orders   CBC with Differential/Platelet   Comprehensive metabolic panel   Iron, TIBC and Ferritin Panel      No orders of the defined types were placed in this encounter.     Follow-up: Return in about 3 months (around 02/22/2023) for a follow-up visit.  Walker Kehr, MD

## 2022-12-04 ENCOUNTER — Telehealth: Payer: Self-pay | Admitting: Physician Assistant

## 2022-12-04 ENCOUNTER — Encounter: Payer: Self-pay | Admitting: Physician Assistant

## 2022-12-04 ENCOUNTER — Encounter: Payer: Self-pay | Admitting: Internal Medicine

## 2022-12-04 NOTE — Telephone Encounter (Signed)
Pls advise was rx by [facility].Marland KitchenRaechel Chute

## 2022-12-04 NOTE — Telephone Encounter (Signed)
Reached out to patient to reschedule missed appointments. patient aware of date and time of new appointments.

## 2022-12-05 ENCOUNTER — Inpatient Hospital Stay: Payer: Commercial Managed Care - PPO | Admitting: Physician Assistant

## 2022-12-05 ENCOUNTER — Inpatient Hospital Stay: Payer: Commercial Managed Care - PPO

## 2022-12-05 ENCOUNTER — Telehealth: Payer: Self-pay | Admitting: Physician Assistant

## 2022-12-05 ENCOUNTER — Other Ambulatory Visit: Payer: Self-pay | Admitting: Internal Medicine

## 2022-12-05 MED ORDER — RIZATRIPTAN BENZOATE 10 MG PO TABS: 10.0000 mg | ORAL_TABLET | Freq: Once | ORAL | 12 refills | Status: DC | PRN

## 2022-12-05 NOTE — Telephone Encounter (Signed)
Contacted patient to scheduled appointments. Patient is aware of appointments that are scheduled.   

## 2022-12-07 ENCOUNTER — Other Ambulatory Visit (HOSPITAL_COMMUNITY): Payer: Self-pay

## 2022-12-07 MED ORDER — SODIUM FLUORIDE 1.1 % DT CREA
TOPICAL_CREAM | DENTAL | 0 refills | Status: DC
  Filled 2022-12-07: qty 51, 30d supply, fill #0

## 2022-12-11 ENCOUNTER — Telehealth: Payer: Self-pay | Admitting: Physician Assistant

## 2022-12-13 ENCOUNTER — Inpatient Hospital Stay: Payer: Commercial Managed Care - PPO

## 2022-12-13 ENCOUNTER — Inpatient Hospital Stay: Payer: Commercial Managed Care - PPO | Admitting: Physician Assistant

## 2022-12-18 ENCOUNTER — Other Ambulatory Visit (HOSPITAL_COMMUNITY): Payer: Self-pay

## 2022-12-20 ENCOUNTER — Inpatient Hospital Stay (HOSPITAL_COMMUNITY)
Admission: EM | Admit: 2022-12-20 | Discharge: 2022-12-28 | DRG: 917 | Disposition: A | Discharge status: Other Acute Inpatient Hospital | Payer: Commercial Managed Care - PPO | Attending: Internal Medicine | Admitting: Internal Medicine

## 2022-12-20 ENCOUNTER — Other Ambulatory Visit: Payer: Self-pay

## 2022-12-20 ENCOUNTER — Emergency Department (HOSPITAL_COMMUNITY): Payer: Commercial Managed Care - PPO

## 2022-12-20 DIAGNOSIS — N390 Urinary tract infection, site not specified: Secondary | ICD-10-CM | POA: Diagnosis present

## 2022-12-20 DIAGNOSIS — T383X1A Poisoning by insulin and oral hypoglycemic [antidiabetic] drugs, accidental (unintentional), initial encounter: Secondary | ICD-10-CM | POA: Diagnosis not present

## 2022-12-20 DIAGNOSIS — Z818 Family history of other mental and behavioral disorders: Secondary | ICD-10-CM

## 2022-12-20 DIAGNOSIS — I959 Hypotension, unspecified: Secondary | ICD-10-CM

## 2022-12-20 DIAGNOSIS — R7989 Other specified abnormal findings of blood chemistry: Secondary | ICD-10-CM

## 2022-12-20 DIAGNOSIS — Z803 Family history of malignant neoplasm of breast: Secondary | ICD-10-CM

## 2022-12-20 DIAGNOSIS — R55 Syncope and collapse: Principal | ICD-10-CM

## 2022-12-20 DIAGNOSIS — E0590 Thyrotoxicosis, unspecified without thyrotoxic crisis or storm: Secondary | ICD-10-CM | POA: Diagnosis present

## 2022-12-20 DIAGNOSIS — Z8349 Family history of other endocrine, nutritional and metabolic diseases: Secondary | ICD-10-CM

## 2022-12-20 DIAGNOSIS — Z87442 Personal history of urinary calculi: Secondary | ICD-10-CM

## 2022-12-20 DIAGNOSIS — F411 Generalized anxiety disorder: Secondary | ICD-10-CM | POA: Diagnosis present

## 2022-12-20 DIAGNOSIS — E271 Primary adrenocortical insufficiency: Secondary | ICD-10-CM | POA: Diagnosis present

## 2022-12-20 DIAGNOSIS — Z881 Allergy status to other antibiotic agents status: Secondary | ICD-10-CM

## 2022-12-20 DIAGNOSIS — K219 Gastro-esophageal reflux disease without esophagitis: Secondary | ICD-10-CM | POA: Diagnosis present

## 2022-12-20 DIAGNOSIS — E160 Drug-induced hypoglycemia without coma: Secondary | ICD-10-CM | POA: Diagnosis present

## 2022-12-20 DIAGNOSIS — E785 Hyperlipidemia, unspecified: Secondary | ICD-10-CM | POA: Diagnosis present

## 2022-12-20 DIAGNOSIS — G4719 Other hypersomnia: Secondary | ICD-10-CM | POA: Diagnosis present

## 2022-12-20 DIAGNOSIS — Z79899 Other long term (current) drug therapy: Secondary | ICD-10-CM

## 2022-12-20 DIAGNOSIS — D649 Anemia, unspecified: Secondary | ICD-10-CM

## 2022-12-20 DIAGNOSIS — G928 Other toxic encephalopathy: Secondary | ICD-10-CM | POA: Diagnosis present

## 2022-12-20 DIAGNOSIS — N1831 Chronic kidney disease, stage 3a: Secondary | ICD-10-CM

## 2022-12-20 DIAGNOSIS — G4700 Insomnia, unspecified: Secondary | ICD-10-CM | POA: Diagnosis present

## 2022-12-20 DIAGNOSIS — Z833 Family history of diabetes mellitus: Secondary | ICD-10-CM

## 2022-12-20 DIAGNOSIS — D631 Anemia in chronic kidney disease: Secondary | ICD-10-CM | POA: Diagnosis present

## 2022-12-20 DIAGNOSIS — R45851 Suicidal ideations: Secondary | ICD-10-CM | POA: Diagnosis present

## 2022-12-20 DIAGNOSIS — E162 Hypoglycemia, unspecified: Secondary | ICD-10-CM | POA: Diagnosis present

## 2022-12-20 DIAGNOSIS — K589 Irritable bowel syndrome without diarrhea: Secondary | ICD-10-CM | POA: Diagnosis present

## 2022-12-20 DIAGNOSIS — E876 Hypokalemia: Secondary | ICD-10-CM | POA: Diagnosis present

## 2022-12-20 DIAGNOSIS — F332 Major depressive disorder, recurrent severe without psychotic features: Secondary | ICD-10-CM | POA: Diagnosis present

## 2022-12-20 DIAGNOSIS — Z8249 Family history of ischemic heart disease and other diseases of the circulatory system: Secondary | ICD-10-CM

## 2022-12-20 DIAGNOSIS — N179 Acute kidney failure, unspecified: Secondary | ICD-10-CM | POA: Diagnosis present

## 2022-12-20 DIAGNOSIS — E871 Hypo-osmolality and hyponatremia: Secondary | ICD-10-CM | POA: Diagnosis present

## 2022-12-20 LAB — COMPREHENSIVE METABOLIC PANEL
ALT: 13 U/L (ref 0–44)
AST: 26 U/L (ref 15–41)
Albumin: 3.1 g/dL — ABNORMAL LOW (ref 3.5–5.0)
Alkaline Phosphatase: 51 U/L (ref 38–126)
Anion gap: 9 (ref 5–15)
BUN: 13 mg/dL (ref 6–20)
CO2: 19 mmol/L — ABNORMAL LOW (ref 22–32)
Calcium: 7.9 mg/dL — ABNORMAL LOW (ref 8.9–10.3)
Chloride: 105 mmol/L (ref 98–111)
Creatinine, Ser: 1.29 mg/dL — ABNORMAL HIGH (ref 0.44–1.00)
GFR, Estimated: 50 mL/min — ABNORMAL LOW (ref >60)
Glucose, Bld: 96 mg/dL (ref 70–99)
Potassium: <2 mmol/L — CL (ref 3.5–5.1)
Sodium: 133 mmol/L — ABNORMAL LOW (ref 135–145)
Total Bilirubin: 0.4 mg/dL (ref 0.3–1.2)
Total Protein: 5.7 g/dL — ABNORMAL LOW (ref 6.5–8.1)

## 2022-12-20 LAB — I-STAT BETA HCG BLOOD, ED (MC, WL, AP ONLY): I-stat hCG, quantitative: <5 m[IU]/mL (ref <5)

## 2022-12-20 LAB — CBC WITH DIFFERENTIAL/PLATELET
Abs Immature Granulocytes: 0.06 10*3/uL (ref 0.00–0.07)
Basophils Absolute: 0 10*3/uL (ref 0.0–0.1)
Basophils Relative: 0 %
Eosinophils Absolute: 0 10*3/uL (ref 0.0–0.5)
Eosinophils Relative: 0 %
HCT: 28.4 % — ABNORMAL LOW (ref 36.0–46.0)
Hemoglobin: 10 g/dL — ABNORMAL LOW (ref 12.0–15.0)
Immature Granulocytes: 0 %
Lymphocytes Relative: 6 %
Lymphs Abs: 0.9 10*3/uL (ref 0.7–4.0)
MCH: 32.3 pg (ref 26.0–34.0)
MCHC: 35.2 g/dL (ref 30.0–36.0)
MCV: 91.6 fL (ref 80.0–100.0)
Monocytes Absolute: 0.9 10*3/uL (ref 0.1–1.0)
Monocytes Relative: 6 %
Neutro Abs: 12.7 10*3/uL — ABNORMAL HIGH (ref 1.7–7.7)
Neutrophils Relative %: 88 %
Platelets: 259 10*3/uL (ref 150–400)
RBC: 3.1 MIL/uL — ABNORMAL LOW (ref 3.87–5.11)
RDW: 13.5 % (ref 11.5–15.5)
WBC: 14.5 10*3/uL — ABNORMAL HIGH (ref 4.0–10.5)
nRBC: 0 % (ref 0.0–0.2)

## 2022-12-20 LAB — PROTIME-INR
INR: 1.1 (ref 0.8–1.2)
Prothrombin Time: 13.7 seconds (ref 11.4–15.2)

## 2022-12-20 LAB — CBG MONITORING, ED
Glucose-Capillary: 119 mg/dL — ABNORMAL HIGH (ref 70–99)
Glucose-Capillary: 89 mg/dL (ref 70–99)

## 2022-12-20 LAB — APTT: aPTT: 28 seconds (ref 24–36)

## 2022-12-20 MED ORDER — LACTATED RINGERS IV BOLUS (SEPSIS)
1000.0000 mL | Freq: Once | INTRAVENOUS | Status: AC
  Administered 2022-12-21: 1000 mL via INTRAVENOUS

## 2022-12-20 MED ORDER — POTASSIUM CHLORIDE CRYS ER 20 MEQ PO TBCR
40.0000 meq | EXTENDED_RELEASE_TABLET | Freq: Once | ORAL | Status: AC
  Administered 2022-12-21: 40 meq via ORAL
  Filled 2022-12-20: qty 2

## 2022-12-20 MED ORDER — HYDROCORTISONE SOD SUC (PF) 100 MG IJ SOLR
100.0000 mg | Freq: Once | INTRAMUSCULAR | Status: AC
  Administered 2022-12-20: 100 mg via INTRAVENOUS
  Filled 2022-12-20: qty 2

## 2022-12-20 MED ORDER — DEXTROSE 50 % IV SOLN
25.0000 mL | Freq: Once | INTRAVENOUS | Status: AC
  Administered 2022-12-20: 25 mL via INTRAVENOUS
  Filled 2022-12-20: qty 50

## 2022-12-20 MED ORDER — LACTATED RINGERS IV BOLUS (SEPSIS)
1000.0000 mL | Freq: Once | INTRAVENOUS | Status: AC
  Administered 2022-12-20: 1000 mL via INTRAVENOUS

## 2022-12-20 MED ORDER — POTASSIUM CHLORIDE 10 MEQ/100ML IV SOLN
10.0000 meq | INTRAVENOUS | Status: AC
  Administered 2022-12-21 (×4): 10 meq via INTRAVENOUS
  Filled 2022-12-20 (×4): qty 100

## 2022-12-20 NOTE — ED Triage Notes (Signed)
Pt arrives to ED unresponsive/LOC. Pt was found on bed having seizure like activity. Pressure was initially 70/48 by EMS and CBG of 22. Pt was given of NS, 1 AMP of D10. Pt also placed on Epi drip running at . Pt with hx of addison's. Pt endorses taking xanax and tramadol tonight

## 2022-12-20 NOTE — ED Notes (Signed)
CBG 89 

## 2022-12-20 NOTE — ED Provider Notes (Signed)
Care assumed from Dr. Lynelle Doctor, patient with syncope, initial hypotension treated with IV fluids and epinephrine drip, and hypoglycemia treated with IV glucose. Now normotensive of medications, glucose adequate. She has a questionable history of Addison's Disease, has been given steroids. She will need admission after completing lab workup.  12:00 AM Critical value reported from lab, potassium less than 2.0.  I have ordered a serum magnesium level, oral and intravenous potassium.  12:29 AM CBG has dropped to 25.  I have ordered additional IV glucose and a glucose infusion.  Lactic acid is elevated at 2.5, I have ordered additional IV fluids.  Also noted is mild hyponatremia with sodium of 133, this is not felt to be clinically significant, mildly elevated creatinine which is in a range she has been at previously but increased compared with 10/03/2022.  I have discussed the case with Dr. Chestine Spore of Pulmonary Critical Care, who agrees to admit the patient.  Of note, magnesium is still pending.  CRITICAL CARE Performed by: Dione Booze Total critical care time: 45 minutes Critical care time was exclusive of separately billable procedures and treating other patients. Critical care was necessary to treat or prevent imminent or life-threatening deterioration. Critical care was time spent personally by me on the following activities: development of treatment plan with patient and/or surrogate as well as nursing, discussions with consultants, evaluation of patient's response to treatment, examination of patient, obtaining history from patient or surrogate, ordering and performing treatments and interventions, ordering and review of laboratory studies, ordering and review of radiographic studies, pulse oximetry and re-evaluation of patient's condition.  Results for orders placed or performed during the hospital encounter of 12/20/22  Lactic acid, plasma  Result Value Ref Range   Lactic Acid, Venous 2.5 (HH) 0.5 -  1.9 mmol/L  Comprehensive metabolic panel  Result Value Ref Range   Sodium 133 (L) 135 - 145 mmol/L   Potassium <2.0 (LL) 3.5 - 5.1 mmol/L   Chloride 105 98 - 111 mmol/L   CO2 19 (L) 22 - 32 mmol/L   Glucose, Bld 96 70 - 99 mg/dL   BUN 13 6 - 20 mg/dL   Creatinine, Ser 1.61 (H) 0.44 - 1.00 mg/dL   Calcium 7.9 (L) 8.9 - 10.3 mg/dL   Total Protein 5.7 (L) 6.5 - 8.1 g/dL   Albumin 3.1 (L) 3.5 - 5.0 g/dL   AST 26 15 - 41 U/L   ALT 13 0 - 44 U/L   Alkaline Phosphatase 51 38 - 126 U/L   Total Bilirubin 0.4 0.3 - 1.2 mg/dL   GFR, Estimated 50 (L) >60 mL/min   Anion gap 9 5 - 15  CBC with Differential  Result Value Ref Range   WBC 14.5 (H) 4.0 - 10.5 K/uL   RBC 3.10 (L) 3.87 - 5.11 MIL/uL   Hemoglobin 10.0 (L) 12.0 - 15.0 g/dL   HCT 09.6 (L) 04.5 - 40.9 %   MCV 91.6 80.0 - 100.0 fL   MCH 32.3 26.0 - 34.0 pg   MCHC 35.2 30.0 - 36.0 g/dL   RDW 81.1 91.4 - 78.2 %   Platelets 259 150 - 400 K/uL   nRBC 0.0 0.0 - 0.2 %   Neutrophils Relative % 88 %   Neutro Abs 12.7 (H) 1.7 - 7.7 K/uL   Lymphocytes Relative 6 %   Lymphs Abs 0.9 0.7 - 4.0 K/uL   Monocytes Relative 6 %   Monocytes Absolute 0.9 0.1 - 1.0 K/uL   Eosinophils Relative 0 %  Eosinophils Absolute 0.0 0.0 - 0.5 K/uL   Basophils Relative 0 %   Basophils Absolute 0.0 0.0 - 0.1 K/uL   Immature Granulocytes 0 %   Abs Immature Granulocytes 0.06 0.00 - 0.07 K/uL  Protime-INR  Result Value Ref Range   Prothrombin Time 13.7 11.4 - 15.2 seconds   INR 1.1 0.8 - 1.2  APTT  Result Value Ref Range   aPTT 28 24 - 36 seconds  Urinalysis, w/ Reflex to Culture (Infection Suspected) -Urine, Clean Catch  Result Value Ref Range   Specimen Source URINE, CLEAN CATCH    Color, Urine YELLOW YELLOW   APPearance CLEAR CLEAR   Specific Gravity, Urine 1.003 (L) 1.005 - 1.030   pH 5.0 5.0 - 8.0   Glucose, UA NEGATIVE NEGATIVE mg/dL   Hgb urine dipstick NEGATIVE NEGATIVE   Bilirubin Urine NEGATIVE NEGATIVE   Ketones, ur NEGATIVE NEGATIVE  mg/dL   Protein, ur NEGATIVE NEGATIVE mg/dL   Nitrite POSITIVE (A) NEGATIVE   Leukocytes,Ua NEGATIVE NEGATIVE   RBC / HPF 0-5 0 - 5 RBC/hpf   WBC, UA 0-5 0 - 5 WBC/hpf   Bacteria, UA MANY (A) NONE SEEN   Squamous Epithelial / HPF 0-5 0 - 5 /HPF  Rapid urine drug screen (hospital performed)  Result Value Ref Range   Opiates NONE DETECTED NONE DETECTED   Cocaine NONE DETECTED NONE DETECTED   Benzodiazepines POSITIVE (A) NONE DETECTED   Amphetamines NONE DETECTED NONE DETECTED   Tetrahydrocannabinol NONE DETECTED NONE DETECTED   Barbiturates NONE DETECTED NONE DETECTED  CBG monitoring, ED  Result Value Ref Range   Glucose-Capillary 89 70 - 99 mg/dL  I-Stat beta hCG blood, ED  Result Value Ref Range   I-stat hCG, quantitative <5.0 <5 mIU/mL   Comment 3          CBG monitoring, ED  Result Value Ref Range   Glucose-Capillary 119 (H) 70 - 99 mg/dL  CBG monitoring, ED  Result Value Ref Range   Glucose-Capillary 25 (LL) 70 - 99 mg/dL   DG Chest Port 1 View  Result Date: 12/20/2022 CLINICAL DATA:  Sepsis EXAM: PORTABLE CHEST 1 VIEW COMPARISON:  06/06/2022 FINDINGS: The heart size and mediastinal contours are within normal limits. Both lungs are clear. The visualized skeletal structures are unremarkable. IMPRESSION: No active disease. Electronically Signed   By: Helyn Numbers M.D.   On: 12/20/2022 23:20      Dione Booze, MD 12/21/22 0040

## 2022-12-20 NOTE — ED Provider Notes (Addendum)
[facility] AT [facility] Provider Note   CSN: [id number] Arrival date & time: 12/20/22  2225     History  Chief Complaint  Patient presents with   Loss of Consciousness    Emily Park is a 52 y.o. female.   Loss of Consciousness  Patient has a history of SVT hyperlipidemia, anxiety, acute renal failure, hypovolemic shock, IBS, menorrhagia who presents to the ED for evaluation of altered mental status.  Patient was found to have seizure-like activity at home.  EMS was called.  They noted a blood pressure of 70/48 and a blood glucose of 22.  Patient was given an amp of dextrose IV as well as a fluid bolus.  Patient was also started on epinephrine drip.  Patient reports feeling fine earlier today.  She had been working out in the yard.  She denied any chest pain or headache.  She denies any abdominal pain.  No vomiting or diarrhea.  No blood in her stool.    Home Medications Prior to Admission medications   Medication Sig Start Date End Date Taking? Authorizing Provider  acetaminophen (TYLENOL) 500 MG tablet Take 1,000 mg by mouth daily as needed for headache.    [provider]  Ascorbic Acid (VITAMIN C PO) Take 1 tablet by mouth every morning.    [provider]  CALCIUM PO Take 1 tablet by mouth every morning.    [provider]  Cholecalciferol (VITAMIN D3) 50 MCG (2000 UT) capsule Take 2,000 Units by mouth daily.    [provider]  clonazePAM (KLONOPIN) 1 MG tablet Take 1 mg by mouth at bedtime. 01/20/21   [provider]  doxepin (SINEQUAN) 75 MG capsule Take 75 mg by mouth at bedtime. 12/22/20   [provider]  Ferric Maltol (ACCRUFER) 30 MG CAPS 1 po bid 10/26/22   Plotnikov, Georgina Quint, MD  hyoscyamine (LEVSIN SL) 0.125 MG SL tablet Place 0.25-0.5 mg under the tongue as needed for cramping. 04/05/22   [provider]  linaclotide (LINZESS) 145 MCG CAPS capsule Take 1 capsule (145 mcg  total) by mouth 1 day or 1 dose. 11/22/22   Plotnikov, Georgina Quint, MD  LINZESS 290 MCG CAPS capsule Take 290 mcg by mouth daily. 08/10/21   [provider]  Magnesium Hydroxide (MILK OF MAGNESIA PO) Take 1 Dose by mouth as needed (constipation).    [provider]  Multiple Vitamin (MULTIVITAMIN WITH MINERALS) TABS tablet Take 1 tablet by mouth every morning.    [provider]  Multiple Vitamins-Minerals (ZINC PO) Take 1 tablet by mouth daily.    [provider]  NIACIN PO Take 1 tablet by mouth every morning.    [provider]  ondansetron (ZOFRAN-ODT) 8 MG disintegrating tablet Take 8 mg by mouth daily as needed for nausea or vomiting.    [provider]  pantoprazole (PROTONIX) 40 MG tablet Take 80 mg by mouth 2 (two) times daily. 04/08/21   [provider]  polyethylene glycol (MIRALAX / GLYCOLAX) 17 g packet Take 17 g by mouth 2 (two) times daily. Patient taking differently: Take 17 g by mouth daily as needed for moderate constipation. 07/10/21   Briant Cedar, MD  potassium chloride SA (KLOR-CON M) 20 MEQ tablet Take 2 tablets (40 mEq total) by mouth daily. Patient taking differently: Take 40 mEq by mouth in the morning, at noon, in the evening, and at bedtime. 03/03/22   Shanon Ace, PA-C  rizatriptan (MAXALT) 10 MG tablet Take 1 tablet (10 mg total) by mouth once as needed for up to 1 dose for migraine. May repeat in 2 hours if needed 12/05/22   Plotnikov, Georgina Quint, MD  sodium fluoride (PREVIDENT 5000 PLUS) 1.1 % CREA dental cream Apply a pea sized amount to toothbrush & brush teeth thoroughly for 2 minutes.  Spit out excess & do not rinse. 12/07/22     vortioxetine HBr (TRINTELLIX) 20 MG TABS tablet Take 20 mg by mouth daily.    [provider]  XIFAXAN 550 MG TABS tablet Take 550 mg by mouth 3 (three) times daily.    [provider]      Allergies    Zithromax [azithromycin]    Review of  Systems   Review of Systems  Cardiovascular:  Positive for syncope.    Physical Exam Updated Vital Signs BP 103/68   Pulse 82   Temp 98.7 F (37.1 C) (Oral)   Resp 19   SpO2 100%  Physical Exam Vitals and nursing note reviewed.  Constitutional:      Appearance: She is well-developed. She is not diaphoretic.  HENT:     Head: Normocephalic and atraumatic.     Right Ear: External ear normal.     Left Ear: External ear normal.  Eyes:     General: No scleral icterus.       Right eye: No discharge.        Left eye: No discharge.     Conjunctiva/sclera: Conjunctivae normal.  Neck:     Trachea: No tracheal deviation.  Cardiovascular:     Rate and Rhythm: Normal rate and regular rhythm.  Pulmonary:     Effort: Pulmonary effort is normal. No respiratory distress.     Breath sounds: Normal breath sounds. No stridor. No wheezing or rales.  Abdominal:     General: Bowel sounds are normal. There is no distension.     Palpations: Abdomen is soft.     Tenderness: There is no abdominal tenderness. There is no guarding or rebound.  Musculoskeletal:        General: No tenderness or deformity.     Cervical back: Neck supple.  Skin:    General: Skin is warm and dry.     Findings: No rash.  Neurological:     General: No focal deficit present.     Mental Status: She is alert.     Cranial Nerves: No cranial nerve deficit, dysarthria or facial asymmetry.     Sensory: No sensory deficit.     Motor: No abnormal muscle tone or seizure activity.     Coordination: Coordination normal.  Psychiatric:        Mood and Affect: Mood normal.     ED Results / Procedures / Treatments   Labs (all labs ordered are listed, but only abnormal results are displayed) Labs Reviewed  CBC WITH DIFFERENTIAL/PLATELET - Abnormal; Notable for the following components:      Result Value   WBC 14.5 (*)    RBC 3.10 (*)    Hemoglobin 10.0 (*)    HCT 28.4 (*)    Neutro Abs 12.7 (*)    All other components  within normal limits  CBG MONITORING, ED - Abnormal; Notable for the following components:   Glucose-Capillary 119 (*)    All other components within normal limits  CULTURE, BLOOD (ROUTINE X 2)  CULTURE, BLOOD (ROUTINE X 2)  LACTIC ACID, PLASMA  LACTIC ACID, PLASMA  COMPREHENSIVE METABOLIC PANEL  PROTIME-INR  APTT  URINALYSIS, W/ REFLEX TO CULTURE (INFECTION SUSPECTED)  CBG MONITORING, ED  I-STAT BETA HCG BLOOD, ED ([facility], [facility], [facility] ONLY)    EKG EKG Interpretation  Date/Time:  Wednesday December 20 2022 22:36:23 EDT Ventricular Rate:  99 PR Interval:  169 QRS Duration: 68 QT Interval:  423 QTC Calculation: 543 R Axis:   25 Text Interpretation: Sinus rhythm Borderline low voltage, extremity leads Nonspecific T abnormalities, lateral leads Prolonged QT interval , noted pn prior ecg Confirmed by Linwood Dibbles [phone]) on 12/20/2022 10:47:47 PM  Radiology DG Chest Port 1 View  Result Date: 12/20/2022 CLINICAL DATA:  Sepsis EXAM: PORTABLE CHEST 1 VIEW COMPARISON:  06/06/2022 FINDINGS: The heart size and mediastinal contours are within normal limits. Both lungs are clear. The visualized skeletal structures are unremarkable. IMPRESSION: No active disease. Electronically Signed   By: Helyn Numbers M.D.   On: 12/20/2022 23:20    Procedures .Critical Care  Performed by: Linwood Dibbles, MD Authorized by: Linwood Dibbles, MD   Critical care provider statement:    Critical care time (minutes):  35   Critical care was time spent personally by me on the following activities:  Development of treatment plan with patient or surrogate, discussions with consultants, evaluation of patient's response to treatment, examination of patient, ordering and review of laboratory studies, ordering and review of radiographic studies, ordering and performing treatments and interventions, pulse oximetry, re-evaluation of patient's condition and review of old charts     Medications Ordered in ED Medications  lactated ringers  bolus 1,000 mL (1,000 mLs Intravenous New Bag/Given 12/20/22 2306)  dextrose 50 % solution 25 mL (25 mLs Intravenous Given 12/20/22 2306)  hydrocortisone sodium succinate (SOLU-CORTEF) 100 MG injection 100 mg (100 mg Intravenous Given 12/20/22 2306)    ED Course/ Medical Decision Making/ A&P                             Medical Decision Making Differential diagnosis includes but not limited to sepsis, hypoglycemia, anemia, cardiac syncope  Amount and/or Complexity of Data Reviewed Labs: ordered. Radiology: ordered.  Risk Prescription drug management.   History ofPatient presented to the ED after a syncopal episode.  EMS also reported hypoglycemia.  I do not see that patient is on any hypoglycemic agents.  There was also mention of her having history of Addison's disease but I do not see that listed elsewhere in her chart.  Patient was given a dose of hydrocortisone empirically.  Patient has remained hemodynamically stable.  She does not require any pressor support.  Labs show initial leukocytosis.  Will check on lactic acid level the rest of her laboratory test and continue to monitor closely.  Care turned over to Dr Preston Fleeting at shift change.       Final Clinical Impression(s) / ED Diagnoses Final diagnoses:  None    Rx / DC Orders ED Discharge Orders     None         Linwood Dibbles, MD 12/20/22 Ouida Sills    Linwood Dibbles, MD 12/20/22 [phone]

## 2022-12-21 ENCOUNTER — Telehealth: Payer: Self-pay | Admitting: Physician Assistant

## 2022-12-21 ENCOUNTER — Encounter (HOSPITAL_COMMUNITY): Payer: Self-pay | Admitting: Pulmonary Disease

## 2022-12-21 ENCOUNTER — Other Ambulatory Visit: Payer: Self-pay

## 2022-12-21 DIAGNOSIS — T383X1A Poisoning by insulin and oral hypoglycemic [antidiabetic] drugs, accidental (unintentional), initial encounter: Secondary | ICD-10-CM | POA: Diagnosis not present

## 2022-12-21 DIAGNOSIS — F418 Other specified anxiety disorders: Secondary | ICD-10-CM | POA: Diagnosis not present

## 2022-12-21 DIAGNOSIS — K589 Irritable bowel syndrome without diarrhea: Secondary | ICD-10-CM | POA: Diagnosis present

## 2022-12-21 DIAGNOSIS — E871 Hypo-osmolality and hyponatremia: Secondary | ICD-10-CM | POA: Diagnosis not present

## 2022-12-21 DIAGNOSIS — N179 Acute kidney failure, unspecified: Secondary | ICD-10-CM | POA: Diagnosis not present

## 2022-12-21 DIAGNOSIS — Z833 Family history of diabetes mellitus: Secondary | ICD-10-CM | POA: Diagnosis not present

## 2022-12-21 DIAGNOSIS — R45851 Suicidal ideations: Secondary | ICD-10-CM | POA: Diagnosis not present

## 2022-12-21 DIAGNOSIS — E0590 Thyrotoxicosis, unspecified without thyrotoxic crisis or storm: Secondary | ICD-10-CM | POA: Diagnosis not present

## 2022-12-21 DIAGNOSIS — R55 Syncope and collapse: Secondary | ICD-10-CM | POA: Diagnosis present

## 2022-12-21 DIAGNOSIS — Z8249 Family history of ischemic heart disease and other diseases of the circulatory system: Secondary | ICD-10-CM | POA: Diagnosis not present

## 2022-12-21 DIAGNOSIS — G928 Other toxic encephalopathy: Secondary | ICD-10-CM | POA: Diagnosis not present

## 2022-12-21 DIAGNOSIS — E162 Hypoglycemia, unspecified: Secondary | ICD-10-CM | POA: Diagnosis present

## 2022-12-21 DIAGNOSIS — Z79899 Other long term (current) drug therapy: Secondary | ICD-10-CM | POA: Diagnosis not present

## 2022-12-21 DIAGNOSIS — G9340 Encephalopathy, unspecified: Secondary | ICD-10-CM

## 2022-12-21 DIAGNOSIS — E271 Primary adrenocortical insufficiency: Secondary | ICD-10-CM | POA: Diagnosis not present

## 2022-12-21 DIAGNOSIS — G4700 Insomnia, unspecified: Secondary | ICD-10-CM | POA: Diagnosis present

## 2022-12-21 DIAGNOSIS — E785 Hyperlipidemia, unspecified: Secondary | ICD-10-CM | POA: Diagnosis present

## 2022-12-21 DIAGNOSIS — N1831 Chronic kidney disease, stage 3a: Secondary | ICD-10-CM | POA: Diagnosis not present

## 2022-12-21 DIAGNOSIS — Z803 Family history of malignant neoplasm of breast: Secondary | ICD-10-CM | POA: Diagnosis not present

## 2022-12-21 DIAGNOSIS — Z881 Allergy status to other antibiotic agents status: Secondary | ICD-10-CM | POA: Diagnosis not present

## 2022-12-21 DIAGNOSIS — E876 Hypokalemia: Secondary | ICD-10-CM | POA: Diagnosis present

## 2022-12-21 DIAGNOSIS — Z818 Family history of other mental and behavioral disorders: Secondary | ICD-10-CM | POA: Diagnosis not present

## 2022-12-21 DIAGNOSIS — F411 Generalized anxiety disorder: Secondary | ICD-10-CM | POA: Diagnosis present

## 2022-12-21 DIAGNOSIS — D631 Anemia in chronic kidney disease: Secondary | ICD-10-CM | POA: Diagnosis not present

## 2022-12-21 DIAGNOSIS — E160 Drug-induced hypoglycemia without coma: Secondary | ICD-10-CM | POA: Diagnosis not present

## 2022-12-21 DIAGNOSIS — Z8349 Family history of other endocrine, nutritional and metabolic diseases: Secondary | ICD-10-CM | POA: Diagnosis not present

## 2022-12-21 DIAGNOSIS — N390 Urinary tract infection, site not specified: Secondary | ICD-10-CM | POA: Diagnosis not present

## 2022-12-21 DIAGNOSIS — F332 Major depressive disorder, recurrent severe without psychotic features: Secondary | ICD-10-CM | POA: Diagnosis not present

## 2022-12-21 DIAGNOSIS — D649 Anemia, unspecified: Secondary | ICD-10-CM | POA: Diagnosis not present

## 2022-12-21 LAB — BASIC METABOLIC PANEL
Anion gap: 11 (ref 5–15)
Anion gap: 9 (ref 5–15)
Anion gap: 10 (ref 5–15)
Anion gap: 8 (ref 5–15)
BUN: 13 mg/dL (ref 6–20)
BUN: 7 mg/dL (ref 6–20)
BUN: 5 mg/dL — ABNORMAL LOW (ref 6–20)
BUN: <5 mg/dL — ABNORMAL LOW (ref 6–20)
CO2: 20 mmol/L — ABNORMAL LOW (ref 22–32)
CO2: 19 mmol/L — ABNORMAL LOW (ref 22–32)
CO2: 19 mmol/L — ABNORMAL LOW (ref 22–32)
CO2: 19 mmol/L — ABNORMAL LOW (ref 22–32)
Calcium: 8.3 mg/dL — ABNORMAL LOW (ref 8.9–10.3)
Calcium: 8.3 mg/dL — ABNORMAL LOW (ref 8.9–10.3)
Calcium: 8.9 mg/dL (ref 8.9–10.3)
Calcium: 8.5 mg/dL — ABNORMAL LOW (ref 8.9–10.3)
Chloride: 106 mmol/L (ref 98–111)
Chloride: 110 mmol/L (ref 98–111)
Chloride: 110 mmol/L (ref 98–111)
Chloride: 109 mmol/L (ref 98–111)
Creatinine, Ser: 1.03 mg/dL — ABNORMAL HIGH (ref 0.44–1.00)
Creatinine, Ser: 0.92 mg/dL (ref 0.44–1.00)
Creatinine, Ser: 0.81 mg/dL (ref 0.44–1.00)
Creatinine, Ser: 0.86 mg/dL (ref 0.44–1.00)
GFR, Estimated: >60 mL/min (ref >60)
GFR, Estimated: >60 mL/min (ref >60)
GFR, Estimated: >60 mL/min (ref >60)
GFR, Estimated: >60 mL/min (ref >60)
Glucose, Bld: 136 mg/dL — ABNORMAL HIGH (ref 70–99)
Glucose, Bld: 31 mg/dL — CL (ref 70–99)
Glucose, Bld: 22 mg/dL — CL (ref 70–99)
Glucose, Bld: 120 mg/dL — ABNORMAL HIGH (ref 70–99)
Potassium: 2.6 mmol/L — CL (ref 3.5–5.1)
Potassium: 3.4 mmol/L — ABNORMAL LOW (ref 3.5–5.1)
Potassium: 3.6 mmol/L (ref 3.5–5.1)
Potassium: 3.7 mmol/L (ref 3.5–5.1)
Sodium: 137 mmol/L (ref 135–145)
Sodium: 138 mmol/L (ref 135–145)
Sodium: 139 mmol/L (ref 135–145)
Sodium: 136 mmol/L (ref 135–145)

## 2022-12-21 LAB — CORTISOL: Cortisol, Plasma: 24.3 ug/dL

## 2022-12-21 LAB — CBC
HCT: 28.9 % — ABNORMAL LOW (ref 36.0–46.0)
Hemoglobin: 10 g/dL — ABNORMAL LOW (ref 12.0–15.0)
MCH: 31.5 pg (ref 26.0–34.0)
MCHC: 34.6 g/dL (ref 30.0–36.0)
MCV: 91.2 fL (ref 80.0–100.0)
Platelets: 274 10*3/uL (ref 150–400)
RBC: 3.17 MIL/uL — ABNORMAL LOW (ref 3.87–5.11)
RDW: 13.5 % (ref 11.5–15.5)
WBC: 14.4 10*3/uL — ABNORMAL HIGH (ref 4.0–10.5)
nRBC: 0 % (ref 0.0–0.2)

## 2022-12-21 LAB — PHOSPHORUS: Phosphorus: 1.7 mg/dL — ABNORMAL LOW (ref 2.5–4.6)

## 2022-12-21 LAB — CBG MONITORING, ED
Glucose-Capillary: 132 mg/dL — ABNORMAL HIGH (ref 70–99)
Glucose-Capillary: 140 mg/dL — ABNORMAL HIGH (ref 70–99)
Glucose-Capillary: 25 mg/dL — CL (ref 70–99)
Glucose-Capillary: 33 mg/dL — CL (ref 70–99)
Glucose-Capillary: 47 mg/dL — ABNORMAL LOW (ref 70–99)
Glucose-Capillary: 50 mg/dL — ABNORMAL LOW (ref 70–99)
Glucose-Capillary: 56 mg/dL — ABNORMAL LOW (ref 70–99)
Glucose-Capillary: 67 mg/dL — ABNORMAL LOW (ref 70–99)
Glucose-Capillary: 77 mg/dL (ref 70–99)

## 2022-12-21 LAB — RAPID URINE DRUG SCREEN, HOSP PERFORMED
Amphetamines: NOT DETECTED
Barbiturates: NOT DETECTED
Benzodiazepines: POSITIVE — AB
Cocaine: NOT DETECTED
Opiates: NOT DETECTED
Tetrahydrocannabinol: NOT DETECTED

## 2022-12-21 LAB — GLUCOSE, CAPILLARY
Glucose-Capillary: 125 mg/dL — ABNORMAL HIGH (ref 70–99)
Glucose-Capillary: 81 mg/dL (ref 70–99)
Glucose-Capillary: 61 mg/dL — ABNORMAL LOW (ref 70–99)
Glucose-Capillary: 90 mg/dL (ref 70–99)
Glucose-Capillary: 47 mg/dL — ABNORMAL LOW (ref 70–99)
Glucose-Capillary: 53 mg/dL — ABNORMAL LOW (ref 70–99)
Glucose-Capillary: 86 mg/dL (ref 70–99)
Glucose-Capillary: 27 mg/dL — CL (ref 70–99)
Glucose-Capillary: 29 mg/dL — CL (ref 70–99)
Glucose-Capillary: 54 mg/dL — ABNORMAL LOW (ref 70–99)
Glucose-Capillary: 33 mg/dL — CL (ref 70–99)
Glucose-Capillary: 66 mg/dL — ABNORMAL LOW (ref 70–99)
Glucose-Capillary: 139 mg/dL — ABNORMAL HIGH (ref 70–99)
Glucose-Capillary: 29 mg/dL — CL (ref 70–99)
Glucose-Capillary: 80 mg/dL (ref 70–99)
Glucose-Capillary: 26 mg/dL — CL (ref 70–99)
Glucose-Capillary: 33 mg/dL — CL (ref 70–99)
Glucose-Capillary: 64 mg/dL — ABNORMAL LOW (ref 70–99)
Glucose-Capillary: >600 mg/dL (ref 70–99)
Glucose-Capillary: 116 mg/dL — ABNORMAL HIGH (ref 70–99)
Glucose-Capillary: 15 mg/dL — CL (ref 70–99)

## 2022-12-21 LAB — URINALYSIS, W/ REFLEX TO CULTURE (INFECTION SUSPECTED)
Bilirubin Urine: NEGATIVE
Glucose, UA: NEGATIVE mg/dL
Hgb urine dipstick: NEGATIVE
Ketones, ur: NEGATIVE mg/dL
Leukocytes,Ua: NEGATIVE
Nitrite: POSITIVE — AB
Protein, ur: NEGATIVE mg/dL
Specific Gravity, Urine: 1.003 — ABNORMAL LOW (ref 1.005–1.030)
pH: 5 (ref 5.0–8.0)

## 2022-12-21 LAB — SODIUM, URINE, RANDOM: Sodium, Ur: 17 mmol/L

## 2022-12-21 LAB — MAGNESIUM
Magnesium: 1.7 mg/dL (ref 1.7–2.4)
Magnesium: 1.5 mg/dL — ABNORMAL LOW (ref 1.7–2.4)

## 2022-12-21 LAB — LACTIC ACID, PLASMA
Lactic Acid, Venous: 2.5 mmol/L (ref 0.5–1.9)
Lactic Acid, Venous: 2.1 mmol/L (ref 0.5–1.9)

## 2022-12-21 LAB — HEMOGLOBIN A1C
Hgb A1c MFr Bld: 4.9 % (ref 4.8–5.6)
Mean Plasma Glucose: 93.93 mg/dL

## 2022-12-21 LAB — ETHANOL: Alcohol, Ethyl (B): <10 mg/dL (ref <10)

## 2022-12-21 LAB — T4, FREE: Free T4: 2.1 ng/dL — ABNORMAL HIGH (ref 0.61–1.12)

## 2022-12-21 LAB — CREATININE, URINE, RANDOM: Creatinine, Urine: 15 mg/dL

## 2022-12-21 LAB — BETA-HYDROXYBUTYRIC ACID: Beta-Hydroxybutyric Acid: 0.08 mmol/L (ref 0.05–0.27)

## 2022-12-21 LAB — TSH: TSH: 0.175 u[IU]/mL — ABNORMAL LOW (ref 0.350–4.500)

## 2022-12-21 LAB — MRSA NEXT GEN BY PCR, NASAL: MRSA by PCR Next Gen: NOT DETECTED

## 2022-12-21 MED ORDER — OCTREOTIDE ACETATE 50 MCG/ML IJ SOLN
50.0000 ug | Freq: Four times a day (QID) | INTRAMUSCULAR | Status: DC
  Administered 2022-12-21: 50 ug via SUBCUTANEOUS
  Filled 2022-12-21 (×2): qty 1

## 2022-12-21 MED ORDER — SODIUM CHLORIDE 0.9 % IV SOLN
2.0000 g | INTRAVENOUS | Status: DC
  Administered 2022-12-22 – 2022-12-24 (×3): 2 g via INTRAVENOUS
  Filled 2022-12-21 (×3): qty 20

## 2022-12-21 MED ORDER — SODIUM CHLORIDE 0.9 % IV SOLN: 2.0000 g | Freq: Every day | INTRAVENOUS | Status: DC

## 2022-12-21 MED ORDER — ONDANSETRON HCL 4 MG/2ML IJ SOLN
4.0000 mg | Freq: Once | INTRAMUSCULAR | Status: AC
  Administered 2022-12-21: 4 mg via INTRAVENOUS
  Filled 2022-12-21: qty 2

## 2022-12-21 MED ORDER — OCTREOTIDE ACETATE 50 MCG/ML IJ SOLN
100.0000 ug | Freq: Four times a day (QID) | INTRAMUSCULAR | Status: AC
  Administered 2022-12-21: 100 ug via SUBCUTANEOUS
  Filled 2022-12-21: qty 2

## 2022-12-21 MED ORDER — POLYETHYLENE GLYCOL 3350 17 G PO PACK
17.0000 g | PACK | Freq: Every day | ORAL | Status: DC | PRN
  Administered 2022-12-22 – 2022-12-23 (×2): 17 g via ORAL
  Filled 2022-12-21 (×2): qty 1

## 2022-12-21 MED ORDER — SODIUM CHLORIDE 0.9 % IV SOLN
2.0000 g | Freq: Once | INTRAVENOUS | Status: AC
  Administered 2022-12-21: 2 g via INTRAVENOUS
  Filled 2022-12-21: qty 20

## 2022-12-21 MED ORDER — DEXTROSE 50 % IV SOLN
25.0000 g | INTRAVENOUS | Status: AC
  Administered 2022-12-21: 25 g via INTRAVENOUS
  Filled 2022-12-21: qty 50

## 2022-12-21 MED ORDER — DEXTROSE 50 % IV SOLN
50.0000 mL | Freq: Once | INTRAVENOUS | Status: AC
  Administered 2022-12-21: 50 mL via INTRAVENOUS
  Filled 2022-12-21: qty 50

## 2022-12-21 MED ORDER — DEXTROSE 10 % IV SOLN
INTRAVENOUS | Status: AC
  Administered 2022-12-21: 1000 mL via INTRAVENOUS

## 2022-12-21 MED ORDER — DEXTROSE 50 % IV SOLN: 50.0000 mL | Freq: Once | INTRAVENOUS | Status: AC

## 2022-12-21 MED ORDER — DEXTROSE 50 % IV SOLN
INTRAVENOUS | Status: AC
  Filled 2022-12-21: qty 50

## 2022-12-21 MED ORDER — POTASSIUM CHLORIDE CRYS ER 20 MEQ PO TBCR
40.0000 meq | EXTENDED_RELEASE_TABLET | Freq: Once | ORAL | Status: AC
  Administered 2022-12-21: 40 meq via ORAL
  Filled 2022-12-21: qty 2

## 2022-12-21 MED ORDER — SODIUM CHLORIDE 0.9% FLUSH
10.0000 mL | Freq: Two times a day (BID) | INTRAVENOUS | Status: DC
  Administered 2022-12-21 – 2022-12-27 (×11): 10 mL
  Administered 2022-12-27: 20 mL
  Administered 2022-12-28: 10 mL

## 2022-12-21 MED ORDER — MAGNESIUM SULFATE 2 GM/50ML IV SOLN
2.0000 g | Freq: Once | INTRAVENOUS | Status: AC
  Administered 2022-12-21: 2 g via INTRAVENOUS
  Filled 2022-12-21: qty 50

## 2022-12-21 MED ORDER — DEXTROSE 50 % IV SOLN
INTRAVENOUS | Status: AC
  Administered 2022-12-21: 50 mL
  Filled 2022-12-21: qty 50

## 2022-12-21 MED ORDER — POTASSIUM PHOSPHATES 15 MMOLE/5ML IV SOLN
30.0000 mmol | Freq: Once | INTRAVENOUS | Status: AC
  Administered 2022-12-21: 30 mmol via INTRAVENOUS
  Filled 2022-12-21: qty 10

## 2022-12-21 MED ORDER — DEXTROSE 20 % IV SOLN
INTRAVENOUS | Status: DC
  Filled 2022-12-21 (×20): qty 500

## 2022-12-21 MED ORDER — SODIUM CHLORIDE 0.9 % IV SOLN: 2.0000 g | INTRAVENOUS | Status: DC

## 2022-12-21 MED ORDER — DOXEPIN HCL 25 MG PO CAPS
75.0000 mg | ORAL_CAPSULE | Freq: Every day | ORAL | Status: DC
  Administered 2022-12-21 – 2022-12-27 (×8): 75 mg via ORAL
  Filled 2022-12-21 (×3): qty 3
  Filled 2022-12-21: qty 1
  Filled 2022-12-21 (×7): qty 3

## 2022-12-21 MED ORDER — POTASSIUM CHLORIDE CRYS ER 20 MEQ PO TBCR
40.0000 meq | EXTENDED_RELEASE_TABLET | ORAL | Status: AC
  Administered 2022-12-21 (×2): 40 meq via ORAL
  Filled 2022-12-21 (×2): qty 2

## 2022-12-21 MED ORDER — DOCUSATE SODIUM 100 MG PO CAPS
100.0000 mg | ORAL_CAPSULE | Freq: Two times a day (BID) | ORAL | Status: DC | PRN
  Administered 2022-12-21 – 2022-12-23 (×3): 100 mg via ORAL
  Filled 2022-12-21 (×3): qty 1

## 2022-12-21 MED ORDER — ONDANSETRON HCL 4 MG/2ML IJ SOLN
4.0000 mg | Freq: Three times a day (TID) | INTRAMUSCULAR | Status: DC | PRN
  Administered 2022-12-21 – 2022-12-22 (×2): 4 mg via INTRAVENOUS
  Filled 2022-12-21 (×2): qty 2

## 2022-12-21 MED ORDER — OCTREOTIDE ACETATE 100 MCG/ML IJ SOLN
100.0000 ug | Freq: Four times a day (QID) | INTRAMUSCULAR | Status: DC
  Administered 2022-12-21 – 2022-12-24 (×12): 100 ug via INTRAVENOUS
  Filled 2022-12-21 (×16): qty 1

## 2022-12-21 MED ORDER — MAGNESIUM SULFATE 2 GM/50ML IV SOLN
2.0000 g | INTRAVENOUS | Status: AC
  Administered 2022-12-21 (×2): 2 g via INTRAVENOUS
  Filled 2022-12-21 (×2): qty 50

## 2022-12-21 MED ORDER — SODIUM CHLORIDE 0.9 % IV SOLN: 2.0000 g | Freq: Once | INTRAVENOUS | Status: DC

## 2022-12-21 MED ORDER — DEXTROSE 50 % IV SOLN
25.0000 g | INTRAVENOUS | Status: AC
  Administered 2022-12-21: 25 g via INTRAVENOUS

## 2022-12-21 MED ORDER — DEXTROSE 50 % IV SOLN
12.5000 g | INTRAVENOUS | Status: AC
  Administered 2022-12-21: 12.5 g via INTRAVENOUS

## 2022-12-21 MED ORDER — SODIUM CHLORIDE 0.9% FLUSH
10.0000 mL | INTRAVENOUS | Status: DC | PRN
  Administered 2022-12-28: 30 mL

## 2022-12-21 MED ORDER — CHLORHEXIDINE GLUCONATE CLOTH 2 % EX PADS
6.0000 | MEDICATED_PAD | Freq: Every day | CUTANEOUS | Status: DC
  Administered 2022-12-21 – 2022-12-28 (×8): 6 via TOPICAL

## 2022-12-21 MED ORDER — DIPHENHYDRAMINE HCL 25 MG PO CAPS
25.0000 mg | ORAL_CAPSULE | Freq: Four times a day (QID) | ORAL | Status: DC | PRN
  Administered 2022-12-21 – 2022-12-27 (×6): 25 mg via ORAL
  Filled 2022-12-21 (×6): qty 1

## 2022-12-21 MED ORDER — VANCOMYCIN HCL 1250 MG/250ML IV SOLN
1250.0000 mg | Freq: Once | INTRAVENOUS | Status: DC
  Filled 2022-12-21: qty 250

## 2022-12-21 MED ORDER — HYDROCORTISONE SOD SUC (PF) 100 MG IJ SOLR
100.0000 mg | Freq: Two times a day (BID) | INTRAMUSCULAR | Status: DC
  Administered 2022-12-21: 100 mg via INTRAVENOUS
  Filled 2022-12-21: qty 2

## 2022-12-21 MED ORDER — HYDROCORTISONE SOD SUC (PF) 100 MG IJ SOLR
100.0000 mg | Freq: Four times a day (QID) | INTRAMUSCULAR | Status: DC
  Administered 2022-12-21 – 2022-12-24 (×14): 100 mg via INTRAVENOUS
  Filled 2022-12-21 (×14): qty 2

## 2022-12-21 MED ORDER — CLONAZEPAM 0.5 MG PO TABS
1.0000 mg | ORAL_TABLET | Freq: Every day | ORAL | Status: DC
  Administered 2022-12-21 – 2022-12-27 (×8): 1 mg via ORAL
  Filled 2022-12-21: qty 1
  Filled 2022-12-21 (×2): qty 2
  Filled 2022-12-21: qty 1
  Filled 2022-12-21: qty 2
  Filled 2022-12-21: qty 1
  Filled 2022-12-21 (×2): qty 2

## 2022-12-21 MED ORDER — HEPARIN SODIUM (PORCINE) 5000 UNIT/ML IJ SOLN
5000.0000 [IU] | Freq: Three times a day (TID) | INTRAMUSCULAR | Status: DC
  Administered 2022-12-21 – 2022-12-25 (×13): 5000 [IU] via SUBCUTANEOUS
  Filled 2022-12-21 (×13): qty 1

## 2022-12-21 MED ORDER — LACTATED RINGERS IV BOLUS
1000.0000 mL | Freq: Once | INTRAVENOUS | Status: AC
  Administered 2022-12-21: 1000 mL via INTRAVENOUS

## 2022-12-21 MED ORDER — DEXTROSE 50 % IV SOLN
INTRAVENOUS | Status: AC
  Administered 2022-12-21: 50 mL via INTRAVENOUS
  Filled 2022-12-21: qty 50

## 2022-12-21 NOTE — Progress Notes (Signed)
Octreotide increased to  q6h. D10w increased to 150cc/h previously.  Additional electrolyte repletion ordered:  Mg 2g x 2 doses Kphos Kdur x 2 doses  Repeat BMP @ noon.  Steffanie Dunn, DO 12/21/22 5:28 AM [facility]  For contact information, see Amion. If no response to pager, please call PCCM consult pager. After hours, 7PM- 7AM, please call Elink.

## 2022-12-21 NOTE — H&P (Signed)
NAME:  Emily Park, MRN:  [id number], DOB:  [birth year], LOS: 0 ADMISSION DATE:  12/20/2022, CONSULTATION DATE:  12/21/22 REFERRING MD:  Randall An, CHIEF COMPLAINT:  AMS   History of Present Illness:  Emily Park is a 52 y/o woman with a PMH of septic shock in 2022, IBS, GERD,  anxiety who presented by EMS after her husband found her minimally responsive on the floor. She was at home today doing housework and they ate a normal dinner this evening. Her husband went to get frosties for dessert and when he returned she was on the floor with reduced level of consciousness, not able to speak. She has had 2 similar episodes in the past- once when she had septic shock and another episode they are unsure of the cause in the fall of 2023. She has been feeling in her usual state of health recently- no n/v/d, no cough or SOB, no dysuria or frequency. She has some discoloration on her left thigh from her heating pad, but otherwise no skin changes or rashes. She has not recently had medications changes or changed the supplements she takes. She works as a Engineer, civil (consulting).   With EMS she was hypotension with BP 70/48 and hypoglycemic with BG 22 with possible seizure-like activity. She was started on an epinephrine infusion and given 750cc NS + amp D50w.  In the ED she was given hydrocortisone and was weaned off the epinephrine infusion. Despite having normal BG transiently, she dropped back into the 20s again. PCCM was consulted for management.   Pertinent  Medical History  GERD IBS Anxiety Septic shock with relative adrenal insufficiency IDA Chronic leukocytosis  Significant Hospital Events: Including procedures, antibiotic start and stop dates in addition to other pertinent events   4/25 admitted- d10, steroids, [place] octreotide, empiric antibiotics  Interim History / Subjective:    Objective   Blood pressure (!) 90/57, pulse 76, temperature 98.7 F (37.1 C), temperature source Oral, resp. rate 20, SpO2 100 %.         Intake/Output Summary (Last 24 hours) at 12/21/2022 0039 Last data filed at 12/21/2022 0008 Gross per 24 hour  Intake 1000 ml  Output --  Net 1000 ml   There were no vitals filed for this visit.  Examination: General: healthy appearing woman lying in bed in NAD HENT: [place]/AT, eyes anicteric Lungs: breathing comfortably on RA, CTAB Cardiovascular: S1S2, RRR Abdomen: soft, NT. Well healed scan in lower abdomen Extremities: no c/c/e. Levido reticularis rash only on anterolateral thigh on her left.  Neuro: awake and alert, moving all extremities, normal speech. Answering questions appropriately.   WBC 14.5 H/H 10/28.4 Na+  133 K+ <2 Bicarb 19, AG 9 BUN 13 Cr 1.29 LA 2.5 UA: nitrite positive, min WBCs UDS: + benzos (home med)  Resolved Hospital Problem list     Assessment & Plan:  Hypoglycemia, refractory to steroids and d10w.  -agree with d10w gtt -hydrocortisone  Q12h -[place] octreotide -accuchecks q1h + PRN -check for urine sulfonylureas, C-peptide -empiric antibiotics  Hypokalemia; worry about high insulin state causing this -MG level pending -oral and IV repletion; cautious  Constipation, has history of IBS -con't Linzess  Anxiety -con't PTA doxepin, clonazepam QHS  Acute metabolic encephalopathy, seems resolved -if recurrent, check BG but if normal with normal VS, will need EEG and imaging  Anemia, chronic -OP iron studies needed; follows with Hematology  CKD 3a -strict I/O -renally dose meds, avoid nephrotoxic meds -monitor  Hyponatremia -urine sodium and Cr  Anemia,  similar to baseline -monitor for bleeding -needs OP follow up with her Hematologist  Husband updated at bedside with patient. Full code.   Best Practice (right click and "Reselect all SmartList Selections" daily)   Diet/type: Regular consistency (see orders) DVT prophylaxis: prophylactic heparin  GI prophylaxis: N/A Lines: N/A Foley:  N/A Code Status:  full code- d/w  patient and her husband Last date of multidisciplinary goals of care discussion [4/25]  Labs   CBC: Recent Labs  Lab 12/20/22 2239  WBC 14.5*  NEUTROABS 12.7*  HGB 10.0*  HCT 28.4*  MCV 91.6  PLT 259    Basic Metabolic Panel: Recent Labs  Lab 12/20/22 2239  NA 133*  K <2.0*  CL 105  CO2 19*  GLUCOSE 96  BUN 13  CREATININE 1.29*  CALCIUM 7.9*   GFR: CrCl cannot be calculated (Unknown ideal weight.). Recent Labs  Lab 12/20/22 2239 12/20/22 2253  WBC 14.5*  --   LATICACIDVEN  --  2.5*    Liver Function Tests: Recent Labs  Lab 12/20/22 2239  AST 26  ALT 13  ALKPHOS 51  BILITOT 0.4  PROT 5.7*  ALBUMIN 3.1*   No results for input(s): "LIPASE", "AMYLASE" in the last 168 hours. No results for input(s): "AMMONIA" in the last 168 hours.  ABG    Component Value Date/Time   HCO3 9.3 (L) 06/06/2022 0608   TCO2 10 (L) 06/06/2022 0608   ACIDBASEDEF 16.0 (H) 06/06/2022 0608   O2SAT 52 06/06/2022 0608     Coagulation Profile: Recent Labs  Lab 12/20/22 2239  INR 1.1    Cardiac Enzymes: No results for input(s): "CKTOTAL", "CKMB", "CKMBINDEX", "TROPONINI" in the last 168 hours.  HbA1C: Hgb A1c MFr Bld  Date/Time Value Ref Range Status  07/06/2021 01:59 PM 4.9 4.8 - 5.6 % Final    Comment:    (NOTE) Pre diabetes:          5.7%-6.4%  Diabetes:              >6.4%  Glycemic control for   <7.0% adults with diabetes   08/31/2020 02:50 AM 5.5 4.8 - 5.6 % Final    Comment:    (NOTE) Pre diabetes:          5.7%-6.4%  Diabetes:              >6.4%  Glycemic control for   <7.0% adults with diabetes     CBG: Recent Labs  Lab 12/20/22 2235 12/20/22 2315 12/21/22 0014  GLUCAP 89 119* 25*    Review of Systems:   Review of Systems  Constitutional:  Negative for chills and fever.  HENT:  Negative for congestion.   Respiratory:  Negative for cough and shortness of breath.   Gastrointestinal:  Positive for constipation. Negative for abdominal  pain, nausea and vomiting.  Genitourinary: Negative.   Musculoskeletal: Negative.   Skin:  Negative for rash.  Neurological:  Positive for sensory change and speech change.     Past Medical History:  She,  has a past medical history of Abnormal TSH, ADD (attention deficit disorder), Anemia, Anxiety, Arrhythmia, Decreased libido, Depression, Heart murmur, Hyperlipidemia, Hypokalemia, Migraine, Nephrolithiasis, Pap smear abnormality of cervix/human papillomavirus (HPV) positive, Sleep difficulties, and SVT (supraventricular tachycardia).   Surgical History:   Past Surgical History:  Procedure Laterality Date   ABLATION     Cardiac ablation, endometrial ablation   BREAST LUMPECTOMY     right breast   BREAST LUMPECTOMY Right  CARDIOVERSION     COLONOSCOPY WITH PROPOFOL N/A 05/16/2021   Procedure: COLONOSCOPY WITH PROPOFOL;  Surgeon: Charlott Rakes, MD;  Location: [facility];  Service: Endoscopy;  Laterality: N/A;   COLPOSCOPY     w/ cervical biopsy   DILITATION & CURRETTAGE/HYSTROSCOPY WITH NOVASURE ABLATION N/A 09/11/2014   Procedure: DILATATION & CURETTAGE/HYSTEROSCOPY WITH NOVASURE ABLATION;  Surgeon: Lenoard Aden, MD;  Location: [facility];  Service: Gynecology;  Laterality: N/A;   GIVENS CAPSULE STUDY N/A 05/16/2021   Procedure: GIVENS CAPSULE STUDY;  Surgeon: Charlott Rakes, MD;  Location: [facility];  Service: Endoscopy;  Laterality: N/A;   XI ROBOT ASSISTED RECTOPEXY N/A 07/07/2021   Procedure: XI ROBOT ASSISTED RECTOPEXY;  Surgeon: Karie Soda, MD;  Location: [facility];  Service: General;  Laterality: N/A;   XI ROBOTIC ASSISTED LOWER ANTERIOR RESECTION N/A 07/07/2021   Procedure: XI ROBOTIC ASSISTED LOW ANTERIOR RECTOSIGMOID RESECTION WITH RIGID PROCTOSCOPY, TAP BLOCK;  Surgeon: Karie Soda, MD;  Location: [facility];  Service: General;  Laterality: N/A;     Social History:   reports that she has never smoked. She has been exposed to tobacco smoke. She has never used  smokeless tobacco. She reports that she does not currently use alcohol after a past usage of about 2.0 standard drinks of alcohol per week. She reports that she does not currently use drugs.   Family History:  Her family history includes AAA (abdominal aortic aneurysm) in her father; Breast cancer in her paternal grandmother; Cancer in her mother; Depression in her mother; Diabetes in her maternal grandfather; Heart attack in her father; Hypertension in her father; Hyperthyroidism in her sister; Hypothyroidism in her mother and sister; Thyroid disease in her mother.   Allergies Allergies  Allergen Reactions   Zithromax [Azithromycin] Rash     Home Medications  Prior to Admission medications   Medication Sig Start Date End Date Taking? Authorizing Provider  acetaminophen (TYLENOL) 500 MG tablet Take 1,000 mg by mouth daily as needed for headache.    [provider]  Ascorbic Acid (VITAMIN C PO) Take 1 tablet by mouth every morning.    [provider]  CALCIUM PO Take 1 tablet by mouth every morning.    [provider]  Cholecalciferol (VITAMIN D3) 50 MCG (2000 UT) capsule Take 2,000 Units by mouth daily.    [provider]  clonazePAM (KLONOPIN) 1 MG tablet Take 1 mg by mouth at bedtime. 01/20/21   [provider]  doxepin (SINEQUAN) 75 MG capsule Take 75 mg by mouth at bedtime. 12/22/20   [provider]  Ferric Maltol (ACCRUFER) 30 MG CAPS 1 po bid 10/26/22   Plotnikov, Georgina Quint, MD  hyoscyamine (LEVSIN SL) 0.125 MG SL tablet Place 0.25-0.5 mg under the tongue as needed for cramping. 04/05/22   [provider]  linaclotide (LINZESS) 145 MCG CAPS capsule Take 1 capsule (145 mcg total) by mouth 1 day or 1 dose. 11/22/22   Plotnikov, Georgina Quint, MD  LINZESS 290 MCG CAPS capsule Take 290 mcg by mouth daily. 08/10/21   [provider]  Magnesium Hydroxide (MILK OF MAGNESIA PO) Take 1 Dose by mouth as needed (constipation).     [provider]  Multiple Vitamin (MULTIVITAMIN WITH MINERALS) TABS tablet Take 1 tablet by mouth every morning.    [provider]  Multiple Vitamins-Minerals (ZINC PO) Take 1 tablet by mouth daily.    [provider]  NIACIN PO Take 1 tablet by mouth every morning.  [provider]  ondansetron (ZOFRAN-ODT) 8 MG disintegrating tablet Take 8 mg by mouth daily as needed for nausea or vomiting.    [provider]  pantoprazole (PROTONIX) 40 MG tablet Take 80 mg by mouth 2 (two) times daily. 04/08/21   [provider]  polyethylene glycol (MIRALAX / GLYCOLAX) 17 g packet Take 17 g by mouth 2 (two) times daily. Patient taking differently: Take 17 g by mouth daily as needed for moderate constipation. 07/10/21   Briant Cedar, MD  potassium chloride SA (KLOR-CON M) 20 MEQ tablet Take 2 tablets (40 mEq total) by mouth daily. Patient taking differently: Take 40 mEq by mouth in the morning, at noon, in the evening, and at bedtime. 03/03/22   Walisiewicz, Yvonna Alanis E, PA-C  rizatriptan (MAXALT) 10 MG tablet Take 1 tablet (10 mg total) by mouth once as needed for up to 1 dose for migraine. May repeat in 2 hours if needed 12/05/22   Plotnikov, Georgina Quint, MD  sodium fluoride (PREVIDENT 5000 PLUS) 1.1 % CREA dental cream Apply a pea sized amount to toothbrush & brush teeth thoroughly for 2 minutes.  Spit out excess & do not rinse. 12/07/22     vortioxetine HBr (TRINTELLIX) 20 MG TABS tablet Take 20 mg by mouth daily.    [provider]  XIFAXAN 550 MG TABS tablet Take 550 mg by mouth 3 (three) times daily.    [provider]     Critical care time:     This patient is critically ill with multiple organ system failure which requires frequent high complexity decision making, assessment, support, evaluation, and titration of therapies. This was completed through the application of advanced monitoring technologies and extensive interpretation  of multiple databases. During this encounter critical care time was devoted to patient care services described in this note for 45 minutes.  Steffanie Dunn, DO 12/21/22 1:23 AM [facility]  For contact information, see Amion. If no response to pager, please call PCCM consult pager. After hours, 7PM- 7AM, please call Elink.

## 2022-12-21 NOTE — Telephone Encounter (Signed)
Patients husband called to cancel upcoming appointments Patient is currently in ICU.

## 2022-12-21 NOTE — H&P (Addendum)
NAME:  Emily Park, MRN:  [id number], DOB:  November 16, 1970, LOS: 0 ADMISSION DATE:  12/20/2022, CONSULTATION DATE:  12/21/22 REFERRING MD:  P[place]on Fleeting CHIEF COMPLAINT:  AMS   History of Present Illness:  Emily Park is a 52 y.o. female who has a PMH as below. She presented to [facility] ED 4/25 with AMS. She was apparently in her usual state of health earlier in the day and had been doing yard work. At dinner and husband later went out to get frosty's and when he returned, she was altered and in the bed with seizure like activity. EMS responded and CBG was 22 with BP 70/48. She received fluids and D10 and was placed on Epi drip.  She apparently had septic shock years ago and was told there was some suspicion for Addison's disease thereafter; however, she has never been formally diagnosed and she is not on any medications for this.  In ED, she was weaned off epinephrine infusion and was continued on D10 after CBG dropped again. Mental status improved and she was alert and oriented and appropriately interacting. Additional labs noteable for K < 2.5 and SCr 1.25, lactate 2.5, WBC 14.5.  She denies any insulin use. She takes a few herbals but has been doing so for some time now. Has IBS but no other major medical problems. Has not had similar issues in the past. Denies any recent illness, no fevers/chills/sweats. Felt normal earlier in the day.  Pertinent  Medical History:  has Paroxysmal SVT (supraventricular tachycardia); Attention deficit hyperactivity disorder (ADHD); Lung nodules; Migraine; Hypovolemic shock; Acute renal failure superimposed on stage 2 chronic kidney disease; Hypotension; Thyroid nodule; Sepsis; Diarrhea; Intractable vomiting with nausea; Lower urinary tract infectious disease; Anemia, iron deficiency; Hypokalemia; Atypical squamous cells of undetermined significance on cytologic smear of cervix (ASC-US); HPV in female; Abnormal thyroid stimulating hormone (TSH) level; Anxiety; Heart murmur;  Mild episode of recurrent major depressive disorder; Rectal prolapse; Syncope and collapse; Complete rectal prolapse; Acute encephalopathy; High anion gap metabolic acidosis; Menorrhagia; Dry mouth; [place] reticularis; IBS (irritable colon syndrome); Thyroid function test abnormal; and Hypoglycemia on their problem list.  Significant Hospital Events: Including procedures, antibiotic start and stop dates in addition to other pertinent events   4/25 admit.  Interim History / Subjective:  Awake, alert.  Objective:  Blood pressure (!) 90/57, pulse 76, temperature 98.7 F (37.1 C), temperature source Oral, resp. rate 20, height 5' 4.75" (1.645 m), weight 56.7 kg, SpO2 100 %.        Intake/Output Summary (Last 24 hours) at 12/21/2022 0104 Last data filed at 12/21/2022 0104 Gross per 24 hour  Intake 2000 ml  Output --  Net 2000 ml   Filed Weights   12/21/22 0031  Weight: 56.7 kg    Examination: General: Adult female, resting in bed, in NAD. Neuro: A&O x 3, no deficits. HEENT: [place]/AT. Sclerae anicteric. EOMI. Cardiovascular: RRR, no M/R/G.  Lungs: Respirations even and unlabored.  CTA bilaterally, No W/R/R. Abdomen: BS x 4, soft, NT/ND.  Musculoskeletal: No gross deformities, no edema.  Skin: Intact, warm, no rashes.  Assessment & Plan:   Severe hypoglycemia - unclear etiology. Denies any insulin use. No ongoing issues with this in the past. - Continue D10 infusion. - Octreotide subcutaneous x 2. - Check sulfonylurea panel. - Q1hr CBG's.  AMS with possible seizures - felt to be 2/2 above. - Supportive care.  Severe hypokalemia - unclear etiology. S/p 40 mEq K PO. AKI. Hyponatremia. - Additional K  now along with 4 runs IV. - Empiric 2g Mag. - Follow up BMP at 0200 and again at 0600.  Possible UTI. - Start CTX. - Check Urine culture.  Hx Anxiety, Depression, Insomnia. - Continue home Doxepin, Klonopin for sleep.  Best practice (evaluated daily):  Diet/type:  Regular consistency (see orders) DVT prophylaxis: prophylactic heparin  GI prophylaxis: N/A Lines: N/A Foley:  N/A Code Status:  full code Last date of multidisciplinary goals of care discussion: None yet.  Labs   CBC: Recent Labs  Lab 12/20/22 2239  WBC 14.5*  NEUTROABS 12.7*  HGB 10.0*  HCT 28.4*  MCV 91.6  PLT 259    Basic Metabolic Panel: Recent Labs  Lab 12/20/22 2239  NA 133*  K <2.0*  CL 105  CO2 19*  GLUCOSE 96  BUN 13  CREATININE 1.29*  CALCIUM 7.9*  MG 1.7   GFR: Estimated Creatinine Clearance: 44.9 mL/min (A) (by C-G formula based on SCr of 1.29 mg/dL (H)). Recent Labs  Lab 12/20/22 2239 12/20/22 2253  WBC 14.5*  --   LATICACIDVEN  --  2.5*    Liver Function Tests: Recent Labs  Lab 12/20/22 2239  AST 26  ALT 13  ALKPHOS 51  BILITOT 0.4  PROT 5.7*  ALBUMIN 3.1*   No results for input(s): "LIPASE", "AMYLASE" in the last 168 hours. No results for input(s): "AMMONIA" in the last 168 hours.  ABG    Component Value Date/Time   HCO3 9.3 (L) 06/06/2022 0608   TCO2 10 (L) 06/06/2022 0608   ACIDBASEDEF 16.0 (H) 06/06/2022 0608   O2SAT 52 06/06/2022 0608     Coagulation Profile: Recent Labs  Lab 12/20/22 2239  INR 1.1    Cardiac Enzymes: No results for input(s): "CKTOTAL", "CKMB", "CKMBINDEX", "TROPONINI" in the last 168 hours.  HbA1C: Hgb A1c MFr Bld  Date/Time Value Ref Range Status  07/06/2021 01:59 PM 4.9 4.8 - 5.6 % Final    Comment:    (NOTE) Pre diabetes:          5.7%-6.4%  Diabetes:              >6.4%  Glycemic control for   <7.0% adults with diabetes   08/31/2020 02:50 AM 5.5 4.8 - 5.6 % Final    Comment:    (NOTE) Pre diabetes:          5.7%-6.4%  Diabetes:              >6.4%  Glycemic control for   <7.0% adults with diabetes     CBG: Recent Labs  Lab 12/20/22 2235 12/20/22 2315 12/21/22 0014  GLUCAP 89 119* 25*    Review of Systems:   All negative; except for those that are bolded, which  indicate positives.  Constitutional: weight loss, weight gain, night sweats, fevers, chills, fatigue, weakness.  HEENT: headaches, sore throat, sneezing, nasal congestion, post nasal drip, difficulty swallowing, tooth/dental problems, visual complaints, visual changes, ear aches. Neuro: difficulty with speech, weakness, numbness, ataxia. CV:  chest pain, orthopnea, PND, swelling in lower extremities, dizziness, palpitations, syncope.  Resp: cough, hemoptysis, dyspnea, wheezing. GI: heartburn, indigestion, abdominal pain, nausea, vomiting, diarrhea, constipation, change in bowel habits, loss of appetite, hematemesis, melena, hematochezia.  GU: dysuria, change in color of urine, urgency or frequency, flank pain, hematuria. MSK: joint pain or swelling, decreased range of motion. Psych: change in mood or affect, depression, anxiety, suicidal ideations, homicidal ideations. Skin: rash, itching, bruising.   Past Medical History:  She,  has a past medical history of Abnormal TSH, ADD (attention deficit disorder), Anemia, Anxiety, Arrhythmia, Decreased libido, Depression, Heart murmur, Hyperlipidemia, Hypokalemia, Migraine, Nephrolithiasis, Pap smear abnormality of cervix/human papillomavirus (HPV) positive, Sleep difficulties, and SVT (supraventricular tachycardia).   Surgical History:   Past Surgical History:  Procedure Laterality Date   ABLATION     Cardiac ablation, endometrial ablation   BREAST LUMPECTOMY     right breast   BREAST LUMPECTOMY Right    CARDIOVERSION     COLONOSCOPY WITH PROPOFOL N/A 05/16/2021   Procedure: COLONOSCOPY WITH PROPOFOL;  Surgeon: Charlott Rakes, MD;  Location: [facility];  Service: Endoscopy;  Laterality: N/A;   COLPOSCOPY     w/ cervical biopsy   DILITATION & CURRETTAGE/HYSTROSCOPY WITH NOVASURE ABLATION N/A 09/11/2014   Procedure: DILATATION & CURETTAGE/HYSTEROSCOPY WITH NOVASURE ABLATION;  Surgeon: Lenoard Aden, MD;  Location: [facility];  Service:  Gynecology;  Laterality: N/A;   GIVENS CAPSULE STUDY N/A 05/16/2021   Procedure: GIVENS CAPSULE STUDY;  Surgeon: Charlott Rakes, MD;  Location: [facility];  Service: Endoscopy;  Laterality: N/A;   XI ROBOT ASSISTED RECTOPEXY N/A 07/07/2021   Procedure: XI ROBOT ASSISTED RECTOPEXY;  Surgeon: Karie Soda, MD;  Location: [facility];  Service: General;  Laterality: N/A;   XI ROBOTIC ASSISTED LOWER ANTERIOR RESECTION N/A 07/07/2021   Procedure: XI ROBOTIC ASSISTED LOW ANTERIOR RECTOSIGMOID RESECTION WITH RIGID PROCTOSCOPY, TAP BLOCK;  Surgeon: Karie Soda, MD;  Location: [facility];  Service: General;  Laterality: N/A;     Social History:   reports that she has never smoked. She has been exposed to tobacco smoke. She has never used smokeless tobacco. She reports that she does not currently use alcohol after a past usage of about 2.0 standard drinks of alcohol per week. She reports that she does not currently use drugs.   Family History:  Her family history includes AAA (abdominal aortic aneurysm) in her father; Breast cancer in her paternal grandmother; Cancer in her mother; Depression in her mother; Diabetes in her maternal grandfather; Heart attack in her father; Hypertension in her father; Hyperthyroidism in her sister; Hypothyroidism in her mother and sister; Thyroid disease in her mother.   Allergies Allergies  Allergen Reactions   Zithromax [Azithromycin] Rash     Home Medications  Prior to Admission medications   Medication Sig Start Date End Date Taking? Authorizing Provider  acetaminophen (TYLENOL) 500 MG tablet Take 1,000 mg by mouth daily as needed for headache.    [provider]  Ascorbic Acid (VITAMIN C PO) Take 1 tablet by mouth every morning.    [provider]  CALCIUM PO Take 1 tablet by mouth every morning.    [provider]  Cholecalciferol (VITAMIN D3) 50 MCG (2000 UT) capsule Take 2,000 Units by mouth daily.    [provider]   clonazePAM (KLONOPIN) 1 MG tablet Take 1 mg by mouth at bedtime. 01/20/21   [provider]  doxepin (SINEQUAN) 75 MG capsule Take 75 mg by mouth at bedtime. 12/22/20   [provider]  Ferric Maltol (ACCRUFER) 30 MG CAPS 1 po bid 10/26/22   Plotnikov, Georgina Quint, MD  hyoscyamine (LEVSIN SL) 0.125 MG SL tablet Place 0.25-0.5 mg under the tongue as needed for cramping. 04/05/22   [provider]  linaclotide (LINZESS) 145 MCG CAPS capsule Take 1 capsule (145 mcg total) by mouth 1 day or 1 dose. 11/22/22   Plotnikov, Georgina Quint, MD  LINZESS 290 MCG CAPS capsule Take 290 mcg  by mouth daily. 08/10/21   [provider]  Magnesium Hydroxide (MILK OF MAGNESIA PO) Take 1 Dose by mouth as needed (constipation).    [provider]  Multiple Vitamin (MULTIVITAMIN WITH MINERALS) TABS tablet Take 1 tablet by mouth every morning.    [provider]  Multiple Vitamins-Minerals (ZINC PO) Take 1 tablet by mouth daily.    [provider]  NIACIN PO Take 1 tablet by mouth every morning.    [provider]  ondansetron (ZOFRAN-ODT) 8 MG disintegrating tablet Take 8 mg by mouth daily as needed for nausea or vomiting.    [provider]  pantoprazole (PROTONIX) 40 MG tablet Take 80 mg by mouth 2 (two) times daily. 04/08/21   [provider]  polyethylene glycol (MIRALAX / GLYCOLAX) 17 g packet Take 17 g by mouth 2 (two) times daily. Patient taking differently: Take 17 g by mouth daily as needed for moderate constipation. 07/10/21   Briant Cedar, MD  potassium chloride SA (KLOR-CON M) 20 MEQ tablet Take 2 tablets (40 mEq total) by mouth daily. Patient taking differently: Take 40 mEq by mouth in the morning, at noon, in the evening, and at bedtime. 03/03/22   Walisiewicz, Yvonna Alanis E, PA-C  rizatriptan (MAXALT) 10 MG tablet Take 1 tablet (10 mg total) by mouth once as needed for up to 1 dose for migraine. May repeat in 2 hours if needed  12/05/22   Plotnikov, Georgina Quint, MD  sodium fluoride (PREVIDENT 5000 PLUS) 1.1 % CREA dental cream Apply a pea sized amount to toothbrush & brush teeth thoroughly for 2 minutes.  Spit out excess & do not rinse. 12/07/22     vortioxetine HBr (TRINTELLIX) 20 MG TABS tablet Take 20 mg by mouth daily.    [provider]  XIFAXAN 550 MG TABS tablet Take 550 mg by mouth 3 (three) times daily.    [provider]     Critical care time: 35 min.   Rutherford Guys, PA - [facility] For pager details, please see AMION or use Epic chat  After 1900, please call ELINK for cross coverage needs 12/21/2022, 1:04 AM

## 2022-12-21 NOTE — Progress Notes (Addendum)
Blood glucose 29 pulled from PICC line. Treated with amp of D50. Patient denies s/s of hypoglycemia and is A&Ox4. Blood glucose rechecked and is 80. Patient has had multiple low blood sugars since being admitted and has been treated with IV dextrose. Patient eating and drinking and tolerating diet. IVF of D20 infusing at 150 ml/H.

## 2022-12-21 NOTE — Progress Notes (Signed)
CBG 27, patient A&Ox4 and denies s/s of  hypoglycemia except c/o slight headache. 25g of d50 given via IV. IV team at bedside to place PICC line. Unable to recheck CBG when due because PICC line placement and sterile procedure. Patient remains A&Ox4. Will recheck CBG once sterile procedure is complete.

## 2022-12-21 NOTE — Progress Notes (Signed)
Peripherally Inserted Central Catheter Placement  The IV Nurse has discussed with the patient and/or persons authorized to consent for the patient, the purpose of this procedure and the potential benefits and risks involved with this procedure.  The benefits include less needle sticks, lab draws from the catheter, and the patient may be discharged home with the catheter. Risks include, but not limited to, infection, bleeding, blood clot (thrombus formation), and puncture of an artery; nerve damage and irregular heartbeat and possibility to perform a PICC exchange if needed/ordered by physician.  Alternatives to this procedure were also discussed.  Bard Power PICC patient education guide, fact sheet on infection prevention and patient information card has been provided to patient /or left at bedside.    PICC Placement Documentation  PICC Triple Lumen 12/21/22 Right Cephalic 39 cm 0 cm (Active)  Indication for Insertion or Continuance of Line Vasoactive infusions 12/21/22 1520  Exposed Catheter (cm) 0 cm 12/21/22 1520  Site Assessment Clean, Dry, Intact 12/21/22 1520  Lumen #1 Status Flushed;Blood return noted;Saline locked 12/21/22 1520  Lumen #2 Status Flushed;Saline locked;Blood return noted 12/21/22 1520  Lumen #3 Status Flushed;Blood return noted 12/21/22 1520  Dressing Type Transparent;Securing device 12/21/22 1520  Dressing Status Antimicrobial disc in place 12/21/22 1520  Dressing Change Due 12/28/22 12/21/22 1520       Romie Jumper 12/21/2022, 3:31 PM

## 2022-12-21 NOTE — Progress Notes (Signed)
Pharmacy Antibiotic Note  Emily Park is a 52 y.o. female admitted on 12/20/2022 with sepsis.  Pharmacy has been consulted for Vancomycin and Cefepime dosing.  Plan: Vancomycin 1250 IV mg x1, then 750 mg every 24 hours. Goal AUC 400-550 Expected AUC 440.5  Cefepime 2 grams every 12 hours   Height: 5' 4.75" (164.5 cm) Weight: 56.7 kg (125 lb) IBW/kg (Calculated) : 56.43  Temp (24hrs), Avg:98.7 F (37.1 C), Min:98.7 F (37.1 C), Max:98.7 F (37.1 C)  Recent Labs  Lab 12/20/22 2239 12/20/22 2253  WBC 14.5*  --   CREATININE 1.29*  --   LATICACIDVEN  --  2.5*    Estimated Creatinine Clearance: 44.9 mL/min (A) (by C-G formula based on SCr of 1.29 mg/dL (H)).    Allergies  Allergen Reactions   Zithromax [Azithromycin] Rash    Thank you for allowing pharmacy to be a part of this patient's care.  Diona Browner 12/21/2022 1:11 AM

## 2022-12-21 NOTE — Progress Notes (Signed)
Spoke with patient's prior endocrinologist, Dr. Sharl Ma, about Emily Park and current illness for an unofficial consult. Unfortunately she was dismissed from their office due to frequently missing appointments.   Discussed hospital course and current refractory hypoglycemia. Differentials including adrenal insufficiency, infectious etiologies, malignancy such as insulinoma vs IGF-2 secreting tumor, and secondary to medications such as sulfonylureas.    He agreed with continuing IV octreotide and hydrocortisone while awaiting test results. Does appear as though sulfonylurea, c-peptide and insulin levels were taken from same blood sample with glucose of 31.  Recommended continue management with IV octreotide and hydrocortisone. If due to IGF secreting tumor would respond to steroids. If continues to not respond, may need to discontinue IV dextrose to prevent insulin dumping. Will see if patient is okay with [name] discussing current situation with her partner and ask if he takes any glucose lowering therapies. Follow up blood cultures and treat any underlying infectious etiology of her hypoglycemia and hypotension  Appreciate Dr. Daune Perch unofficial consult and insight into Emily Park's critical illness.

## 2022-12-21 NOTE — ED Notes (Signed)
MD made aware of pt's CBG of 50. Verbal orders obtained for interventions

## 2022-12-21 NOTE — Progress Notes (Signed)
Dr. Everardo All at the bedside speaking with patient and her husband. This RN made MD aware that patient had a short run of Vtach but has otherwise been in NSR. MD acknowledged and no new orders given at this time.

## 2022-12-22 ENCOUNTER — Inpatient Hospital Stay: Payer: Commercial Managed Care - PPO | Admitting: Physician Assistant

## 2022-12-22 ENCOUNTER — Inpatient Hospital Stay: Payer: Commercial Managed Care - PPO

## 2022-12-22 ENCOUNTER — Telehealth: Payer: Self-pay

## 2022-12-22 DIAGNOSIS — E162 Hypoglycemia, unspecified: Secondary | ICD-10-CM | POA: Diagnosis not present

## 2022-12-22 LAB — BASIC METABOLIC PANEL
Anion gap: 14 (ref 5–15)
Anion gap: 5 (ref 5–15)
Anion gap: 7 (ref 5–15)
Anion gap: 8 (ref 5–15)
Anion gap: 9 (ref 5–15)
BUN: <5 mg/dL — ABNORMAL LOW (ref 6–20)
BUN: <5 mg/dL — ABNORMAL LOW (ref 6–20)
BUN: <5 mg/dL — ABNORMAL LOW (ref 6–20)
BUN: <5 mg/dL — ABNORMAL LOW (ref 6–20)
BUN: <5 mg/dL — ABNORMAL LOW (ref 6–20)
CO2: 15 mmol/L — ABNORMAL LOW (ref 22–32)
CO2: 20 mmol/L — ABNORMAL LOW (ref 22–32)
CO2: 18 mmol/L — ABNORMAL LOW (ref 22–32)
CO2: 18 mmol/L — ABNORMAL LOW (ref 22–32)
CO2: 18 mmol/L — ABNORMAL LOW (ref 22–32)
Calcium: 6.5 mg/dL — ABNORMAL LOW (ref 8.9–10.3)
Calcium: 8.3 mg/dL — ABNORMAL LOW (ref 8.9–10.3)
Calcium: 8.5 mg/dL — ABNORMAL LOW (ref 8.9–10.3)
Calcium: 8.4 mg/dL — ABNORMAL LOW (ref 8.9–10.3)
Calcium: 8.4 mg/dL — ABNORMAL LOW (ref 8.9–10.3)
Chloride: 101 mmol/L (ref 98–111)
Chloride: 113 mmol/L — ABNORMAL HIGH (ref 98–111)
Chloride: 112 mmol/L — ABNORMAL HIGH (ref 98–111)
Chloride: 109 mmol/L (ref 98–111)
Chloride: 108 mmol/L (ref 98–111)
Creatinine, Ser: 1.01 mg/dL — ABNORMAL HIGH (ref 0.44–1.00)
Creatinine, Ser: 0.93 mg/dL (ref 0.44–1.00)
Creatinine, Ser: 1.04 mg/dL — ABNORMAL HIGH (ref 0.44–1.00)
Creatinine, Ser: 1.09 mg/dL — ABNORMAL HIGH (ref 0.44–1.00)
Creatinine, Ser: 1.12 mg/dL — ABNORMAL HIGH (ref 0.44–1.00)
GFR, Estimated: >60 mL/min (ref >60)
GFR, Estimated: >60 mL/min (ref >60)
GFR, Estimated: >60 mL/min (ref >60)
GFR, Estimated: >60 mL/min (ref >60)
GFR, Estimated: 59 mL/min — ABNORMAL LOW (ref >60)
Glucose, Bld: >1200 mg/dL (ref 70–99)
Glucose, Bld: 38 mg/dL — CL (ref 70–99)
Glucose, Bld: 183 mg/dL — ABNORMAL HIGH (ref 70–99)
Glucose, Bld: 207 mg/dL — ABNORMAL HIGH (ref 70–99)
Glucose, Bld: 163 mg/dL — ABNORMAL HIGH (ref 70–99)
Potassium: 2.9 mmol/L — ABNORMAL LOW (ref 3.5–5.1)
Potassium: 4 mmol/L (ref 3.5–5.1)
Potassium: 5.8 mmol/L — ABNORMAL HIGH (ref 3.5–5.1)
Potassium: 4.3 mmol/L (ref 3.5–5.1)
Potassium: 4.4 mmol/L (ref 3.5–5.1)
Sodium: 130 mmol/L — ABNORMAL LOW (ref 135–145)
Sodium: 138 mmol/L (ref 135–145)
Sodium: 137 mmol/L (ref 135–145)
Sodium: 135 mmol/L (ref 135–145)
Sodium: 135 mmol/L (ref 135–145)

## 2022-12-22 LAB — GLUCOSE, CAPILLARY
Glucose-Capillary: 109 mg/dL — ABNORMAL HIGH (ref 70–99)
Glucose-Capillary: 12 mg/dL — CL (ref 70–99)
Glucose-Capillary: 120 mg/dL — ABNORMAL HIGH (ref 70–99)
Glucose-Capillary: 140 mg/dL — ABNORMAL HIGH (ref 70–99)
Glucose-Capillary: 141 mg/dL — ABNORMAL HIGH (ref 70–99)
Glucose-Capillary: 17 mg/dL — CL (ref 70–99)
Glucose-Capillary: 172 mg/dL — ABNORMAL HIGH (ref 70–99)
Glucose-Capillary: 180 mg/dL — ABNORMAL HIGH (ref 70–99)
Glucose-Capillary: 218 mg/dL — ABNORMAL HIGH (ref 70–99)
Glucose-Capillary: 250 mg/dL — ABNORMAL HIGH (ref 70–99)
Glucose-Capillary: 32 mg/dL — CL (ref 70–99)
Glucose-Capillary: 35 mg/dL — CL (ref 70–99)
Glucose-Capillary: 45 mg/dL — ABNORMAL LOW (ref 70–99)
Glucose-Capillary: 48 mg/dL — ABNORMAL LOW (ref 70–99)
Glucose-Capillary: 61 mg/dL — ABNORMAL LOW (ref 70–99)
Glucose-Capillary: 68 mg/dL — ABNORMAL LOW (ref 70–99)
Glucose-Capillary: 77 mg/dL (ref 70–99)
Glucose-Capillary: 84 mg/dL (ref 70–99)
Glucose-Capillary: 94 mg/dL (ref 70–99)
Glucose-Capillary: 97 mg/dL (ref 70–99)
Glucose-Capillary: >600 mg/dL (ref 70–99)
Glucose-Capillary: >600 mg/dL (ref 70–99)
Glucose-Capillary: >600 mg/dL (ref 70–99)
Glucose-Capillary: >600 mg/dL (ref 70–99)

## 2022-12-22 LAB — INSULIN, RANDOM
Insulin: 48.8 u[IU]/mL — ABNORMAL HIGH (ref 2.6–24.9)
Insulin: 62.2 u[IU]/mL — ABNORMAL HIGH (ref 2.6–24.9)

## 2022-12-22 LAB — C-PEPTIDE
C-Peptide: 0.4 ng/mL — ABNORMAL LOW (ref 1.1–4.4)
C-Peptide: 0.2 ng/mL — ABNORMAL LOW (ref 1.1–4.4)

## 2022-12-22 MED ORDER — DEXTROSE 50 % IV SOLN
INTRAVENOUS | Status: AC
  Administered 2022-12-22: 25 g via INTRAVENOUS
  Filled 2022-12-22: qty 50

## 2022-12-22 MED ORDER — DEXTROSE 50 % IV SOLN
INTRAVENOUS | Status: AC
  Filled 2022-12-22: qty 50

## 2022-12-22 MED ORDER — POTASSIUM CHLORIDE 10 MEQ/50ML IV SOLN
10.0000 meq | INTRAVENOUS | Status: AC
  Administered 2022-12-22 (×5): 10 meq via INTRAVENOUS
  Filled 2022-12-22 (×3): qty 50

## 2022-12-22 MED ORDER — LORAZEPAM 2 MG/ML IJ SOLN
INTRAMUSCULAR | Status: AC
  Administered 2022-12-22: 1 mg via INTRAVENOUS
  Filled 2022-12-22: qty 1

## 2022-12-22 MED ORDER — DEXTROSE 50 % IV SOLN
25.0000 g | INTRAVENOUS | Status: AC
  Administered 2022-12-22: 25 g via INTRAVENOUS

## 2022-12-22 MED ORDER — LORAZEPAM 2 MG/ML IJ SOLN: 1.0000 mg | INTRAMUSCULAR | Status: AC

## 2022-12-22 MED ORDER — MAGNESIUM SULFATE 2 GM/50ML IV SOLN
2.0000 g | Freq: Once | INTRAVENOUS | Status: AC
  Administered 2022-12-22: 2 g via INTRAVENOUS
  Filled 2022-12-22: qty 50

## 2022-12-22 MED ORDER — DEXTROSE 50 % IV SOLN: 25.0000 g | INTRAVENOUS | Status: AC

## 2022-12-22 NOTE — Telephone Encounter (Signed)
PA approved. Effective  12/22/2022 to 12/22/2023 

## 2022-12-22 NOTE — Progress Notes (Signed)
eLink Physician-Brief Progress Note Patient Name: Emily Park DOB: 23-May-1971 MRN: [id number]   Date of Service  12/22/2022  HPI/Events of Note  Patient admitted with severe and refractory hypoglycemia of unclear cause.  Now since midnight glucose has finally been elevated.    eICU Interventions  Dextrose sources now stopped and will continue to check glucose hourly. Replacing K and MG     Intervention Category Major Interventions: Other:  Henry Russel, P 12/22/2022, 1:58 AM

## 2022-12-22 NOTE — Telephone Encounter (Signed)
PA initiated via Covermymeds; KEY: [id number]. Awaiting determination.

## 2022-12-22 NOTE — Progress Notes (Addendum)
NAME:  Emily Park, MRN:  [id number], DOB:  1971-02-05, LOS: 1 ADMISSION DATE:  12/20/2022, CONSULTATION DATE:  12/21/22 REFERRING MD:  Randall An, CHIEF COMPLAINT:  AMS   History of Present Illness:  Ms. Gronau is a 52 y/o woman with a PMH of septic shock in 2022, IBS, GERD,  anxiety who presented by EMS after her husband found her minimally responsive on the floor. She was at home today doing housework and they ate a normal dinner this evening. Her husband went to get frosties for dessert and when he returned she was on the floor with reduced level of consciousness, not able to speak. She has had 2 similar episodes in the past- once when she had septic shock and another episode they are unsure of the cause in the fall of 2023. She has been feeling in her usual state of health recently- no n/v/d, no cough or SOB, no dysuria or frequency. She has some discoloration on her left thigh from her heating pad, but otherwise no skin changes or rashes. She has not recently had medications changes or changed the supplements she takes. She works as a Engineer, civil (consulting).   With EMS she was hypotension with BP 70/48 and hypoglycemic with BG 22 with possible seizure-like activity. She was started on an epinephrine infusion and given 750cc NS + amp D50w.  In the ED she was given hydrocortisone and was weaned off the epinephrine infusion. Despite having normal BG transiently, she dropped back into the 20s again. PCCM was consulted for management.   Pertinent  Medical History  GERD IBS Anxiety Septic shock with relative adrenal insufficiency IDA Chronic leukocytosis  Significant Hospital Events: Including procedures, antibiotic start and stop dates in addition to other pertinent events   4/25 admitted- d10, steroids, [place] octreotide, empiric antibiotics  Interim History / Subjective:  Currently on D20 at 150 cc/h, CBG 45-97 this shift Insulin level 62.2, C-peptide 0.2, Sulfonylurea assay pending  Objective   Blood  pressure 92/69, pulse 96, temperature 98.2 F (36.8 C), temperature source Oral, resp. rate 19, height 5' 4.75" (1.645 m), weight 62.4 kg, SpO2 97 %.        Intake/Output Summary (Last 24 hours) at 12/22/2022 0952 Last data filed at 12/22/2022 0916 Gross per 24 hour  Intake 3792.16 ml  Output 5325 ml  Net -1532.84 ml    Filed Weights   12/21/22 0031 12/21/22 0800 12/22/22 0421  Weight: 56.7 kg 60.3 kg 62.4 kg    Examination: General: No distress, laying in bed HENT: Oropharynx clear Lungs: Clear bilaterally. Cardiovascular: Regular, no murmur Abdomen: Nondistended, positive bowel sounds Extremities: No edema Neuro: Awake, alert, moves all extremities, follows commands.  Slightly anxious.    Resolved Hospital Problem list     Assessment & Plan:  Hypoglycemia, refractory to steroids, now on d20.  Etiology unclear but suspect exogenous insulin based on low C-peptide, consider insulin secreting tumor but in that case c-peptide should be elevated -Continue subcutaneous octreotide -Continue D20 at current rate -Hydrocortisone 100 mg every 12 hours  Suspected urinary tract infection based on UA, -Follow urine culture -Continue ceftriaxone  Hypokalemia; improved.  Suspect due to high insulin state -Follow BMP -Replete electrolytes as indicated  Constipation, has history of IBS Question history of laxative abuse -Linzess ordered  Anxiety -Continue home doxepin, clonazepam QHS -Suspect she would benefit from psychiatry evaluation.  Question whether she took exogenous insulin  Acute metabolic encephalopathy, resolved -if recurrent, check BG but if normal with normal VS, will  need EEG and imaging  Anemia, chronic --Follow with hematology outpatient, iron studies  CKD 3a -Follow BMP, urine output, strict I's/O -Renal dose medications  Hyponatremia-resolved -Following BMP  Anemia, similar to baseline -monitor for bleeding -needs OP follow up with her  Hematologist  Hyperthyroidism.  Variability noted in her TSH, elevated T4 and low TSH on this admission.  Seems to correlate with degree of critical illness based on past labs. -Consider treatment of hyperthyroidism especially if we continue to suspect this is present after she is stabilized from this acute illness.   Best Practice (right click and "Reselect all SmartList Selections" daily)   Diet/type: Regular consistency (see orders) DVT prophylaxis: prophylactic heparin  GI prophylaxis: N/A Lines: N/A Foley:  N/A Code Status:  full code- d/w patient and her husband Last date of multidisciplinary goals of care discussion [4/25]  Labs   CBC: Recent Labs  Lab 12/20/22 2239 12/21/22 1140  WBC 14.5* 14.4*  NEUTROABS 12.7*  --   HGB 10.0* 10.0*  HCT 28.4* 28.9*  MCV 91.6 91.2  PLT 259 274     Basic Metabolic Panel: Recent Labs  Lab 12/20/22 2239 12/21/22 0112 12/21/22 1140 12/21/22 1641 12/21/22 2212 12/22/22 0004 12/22/22 0414  NA 133* 137 138 139 136 130* 138  K <2.0* 2.6* 3.4* 3.6 3.7 2.9* 4.0  CL 105 106 110 110 109 101 113*  CO2 19* 20* 19* 19* 19* 15* 20*  GLUCOSE 96 136* 31* 22* 120* >1,200* 38*  BUN 13 13 7  5* <5* <5* <5*  CREATININE 1.29* 1.03* 0.92 0.81 0.86 1.01* 0.93  CALCIUM 7.9* 8.3* 8.3* 8.9 8.5* 6.5* 8.3*  MG 1.7 1.5*  --   --   --   --   --   PHOS  --  1.7*  --   --   --   --   --     GFR: Estimated Creatinine Clearance: 62.3 mL/min (by C-G formula based on SCr of 0.93 mg/dL). Recent Labs  Lab 12/20/22 2239 12/20/22 2253 12/21/22 0112 12/21/22 1140  WBC 14.5*  --   --  14.4*  LATICACIDVEN  --  2.5* 2.1*  --      Liver Function Tests: Recent Labs  Lab 12/20/22 2239  AST 26  ALT 13  ALKPHOS 51  BILITOT 0.4  PROT 5.7*  ALBUMIN 3.1*    No results for input(s): "LIPASE", "AMYLASE" in the last 168 hours. No results for input(s): "AMMONIA" in the last 168 hours.  ABG    Component Value Date/Time   HCO3 9.3 (L) 06/06/2022 0608    TCO2 10 (L) 06/06/2022 0608   ACIDBASEDEF 16.0 (H) 06/06/2022 0608   O2SAT 52 06/06/2022 0608     Coagulation Profile: Recent Labs  Lab 12/20/22 2239  INR 1.1     Cardiac Enzymes: No results for input(s): "CKTOTAL", "CKMB", "CKMBINDEX", "TROPONINI" in the last 168 hours.  HbA1C: Hgb A1c MFr Bld  Date/Time Value Ref Range Status  12/20/2022 10:39 PM 4.9 4.8 - 5.6 % Final    Comment:    (NOTE) Pre diabetes:          5.7%-6.4%  Diabetes:              >6.4%  Glycemic control for   <7.0% adults with diabetes   07/06/2021 01:59 PM 4.9 4.8 - 5.6 % Final    Comment:    (NOTE) Pre diabetes:          5.7%-6.4%  Diabetes:              >  6.4%  Glycemic control for   <7.0% adults with diabetes     CBG: Recent Labs  Lab 12/22/22 0615 12/22/22 0737 12/22/22 0811 12/22/22 0901 12/22/22 0942  GLUCAP 84 61* 68* 45* 97     Review of Systems:   Review of Systems  Constitutional:  Negative for chills and fever.  HENT:  Negative for congestion.   Respiratory:  Negative for cough and shortness of breath.   Gastrointestinal:  Positive for constipation. Negative for abdominal pain, nausea and vomiting.  Genitourinary: Negative.   Musculoskeletal: Negative.   Skin:  Negative for rash.  Neurological:  Positive for sensory change and speech change.     Past Medical History:  She,  has a past medical history of Abnormal TSH, ADD (attention deficit disorder), Anemia, Anxiety, Arrhythmia, Decreased libido, Depression, Heart murmur, Hyperlipidemia, Hypokalemia, Migraine, Nephrolithiasis, Pap smear abnormality of cervix/human papillomavirus (HPV) positive, Sleep difficulties, and SVT (supraventricular tachycardia).   Surgical History:   Past Surgical History:  Procedure Laterality Date   ABLATION     Cardiac ablation, endometrial ablation   BREAST LUMPECTOMY     right breast   BREAST LUMPECTOMY Right    CARDIOVERSION     COLONOSCOPY WITH PROPOFOL N/A 05/16/2021    Procedure: COLONOSCOPY WITH PROPOFOL;  Surgeon: Charlott Rakes, MD;  Location: [facility];  Service: Endoscopy;  Laterality: N/A;   COLPOSCOPY     w/ cervical biopsy   DILITATION & CURRETTAGE/HYSTROSCOPY WITH NOVASURE ABLATION N/A 09/11/2014   Procedure: DILATATION & CURETTAGE/HYSTEROSCOPY WITH NOVASURE ABLATION;  Surgeon: Lenoard Aden, MD;  Location: [facility];  Service: Gynecology;  Laterality: N/A;   GIVENS CAPSULE STUDY N/A 05/16/2021   Procedure: GIVENS CAPSULE STUDY;  Surgeon: Charlott Rakes, MD;  Location: [facility];  Service: Endoscopy;  Laterality: N/A;   XI ROBOT ASSISTED RECTOPEXY N/A 07/07/2021   Procedure: XI ROBOT ASSISTED RECTOPEXY;  Surgeon: Karie Soda, MD;  Location: [facility];  Service: General;  Laterality: N/A;   XI ROBOTIC ASSISTED LOWER ANTERIOR RESECTION N/A 07/07/2021   Procedure: XI ROBOTIC ASSISTED LOW ANTERIOR RECTOSIGMOID RESECTION WITH RIGID PROCTOSCOPY, TAP BLOCK;  Surgeon: Karie Soda, MD;  Location: [facility];  Service: General;  Laterality: N/A;     Social History:   reports that she has never smoked. She has been exposed to tobacco smoke. She has never used smokeless tobacco. She reports that she does not currently use alcohol after a past usage of about 2.0 standard drinks of alcohol per week. She reports that she does not currently use drugs.   Family History:  Her family history includes AAA (abdominal aortic aneurysm) in her father; Breast cancer in her paternal grandmother; Cancer in her mother; Depression in her mother; Diabetes in her maternal grandfather; Heart attack in her father; Hypertension in her father; Hyperthyroidism in her sister; Hypothyroidism in her mother and sister; Thyroid disease in her mother.   Allergies Allergies  Allergen Reactions   Zithromax [Azithromycin] Rash     Home Medications  Prior to Admission medications   Medication Sig Start Date End Date Taking? Authorizing Provider  acetaminophen (TYLENOL) 500 MG tablet  Take 1,000 mg by mouth daily as needed for headache.    [provider]  Ascorbic Acid (VITAMIN C PO) Take 1 tablet by mouth every morning.    [provider]  CALCIUM PO Take 1 tablet by mouth every morning.    [provider]  Cholecalciferol (VITAMIN D3) 50 MCG (2000 UT) capsule Take 2,000 Units by mouth  daily.    [provider]  clonazePAM (KLONOPIN) 1 MG tablet Take 1 mg by mouth at bedtime. 01/20/21   [provider]  doxepin (SINEQUAN) 75 MG capsule Take 75 mg by mouth at bedtime. 12/22/20   [provider]  Ferric Maltol (ACCRUFER) 30 MG CAPS 1 po bid 10/26/22   Plotnikov, Georgina Quint, MD  hyoscyamine (LEVSIN SL) 0.125 MG SL tablet Place 0.25-0.5 mg under the tongue as needed for cramping. 04/05/22   [provider]  linaclotide (LINZESS) 145 MCG CAPS capsule Take 1 capsule (145 mcg total) by mouth 1 day or 1 dose. 11/22/22   Plotnikov, Georgina Quint, MD  LINZESS 290 MCG CAPS capsule Take 290 mcg by mouth daily. 08/10/21   [provider]  Magnesium Hydroxide (MILK OF MAGNESIA PO) Take 1 Dose by mouth as needed (constipation).    [provider]  Multiple Vitamin (MULTIVITAMIN WITH MINERALS) TABS tablet Take 1 tablet by mouth every morning.    [provider]  Multiple Vitamins-Minerals (ZINC PO) Take 1 tablet by mouth daily.    [provider]  NIACIN PO Take 1 tablet by mouth every morning.    [provider]  ondansetron (ZOFRAN-ODT) 8 MG disintegrating tablet Take 8 mg by mouth daily as needed for nausea or vomiting.    [provider]  pantoprazole (PROTONIX) 40 MG tablet Take 80 mg by mouth 2 (two) times daily. 04/08/21   [provider]  polyethylene glycol (MIRALAX / GLYCOLAX) 17 g packet Take 17 g by mouth 2 (two) times daily. Patient taking differently: Take 17 g by mouth daily as needed for moderate constipation. 07/10/21   Briant Cedar, MD  potassium chloride  SA (KLOR-CON M) 20 MEQ tablet Take 2 tablets (40 mEq total) by mouth daily. Patient taking differently: Take 40 mEq by mouth in the morning, at noon, in the evening, and at bedtime. 03/03/22   Walisiewicz, Yvonna Alanis E, PA-C  rizatriptan (MAXALT) 10 MG tablet Take 1 tablet (10 mg total) by mouth once as needed for up to 1 dose for migraine. May repeat in 2 hours if needed 12/05/22   Plotnikov, Georgina Quint, MD  sodium fluoride (PREVIDENT 5000 PLUS) 1.1 % CREA dental cream Apply a pea sized amount to toothbrush & brush teeth thoroughly for 2 minutes.  Spit out excess & do not rinse. 12/07/22     vortioxetine HBr (TRINTELLIX) 20 MG TABS tablet Take 20 mg by mouth daily.    [provider]  XIFAXAN 550 MG TABS tablet Take 550 mg by mouth 3 (three) times daily.    [provider]     Critical care time: 32 minutes    This patient is critically ill with multiple organ system failure which requires frequent high complexity decision making, assessment, support, evaluation, and titration of therapies. This was completed through the application of advanced monitoring technologies and extensive interpretation of multiple databases. During this encounter critical care time was devoted to patient care services described in this note for 32 minutes minutes.   Levy Pupa, MD, PhD 12/22/2022, 10:00 AM [facility] [phone] or if no answer before 7:00PM call [phone] For any issues after 7:00PM please call eLink [phone]

## 2022-12-22 NOTE — Progress Notes (Signed)
Patient threatening to leave, tearing gown off, screaming, thinks people are talking about her, states "maybe I just need to go to a mental health facility," patient states "I may be acting crazy, but I know I am not crazy."  Dr. Delton Coombes paged and 1 mg Ativan given with relief.  Will continue to monitor.  Darrold Span

## 2022-12-22 NOTE — Progress Notes (Signed)
Patient's spouse, [name], turned off the patient's Dextrose infusion for the 2nd time. This RN informed the patient's spouse for the 2nd time to not touch the IV pole and to press the call bell.  Dr. Delton Coombes witnessed the 1st incident.  Charge nurse notified.  Will continue to monitor.  Emily Park

## 2022-12-23 DIAGNOSIS — E162 Hypoglycemia, unspecified: Secondary | ICD-10-CM | POA: Diagnosis not present

## 2022-12-23 LAB — BASIC METABOLIC PANEL
Anion gap: 9 (ref 5–15)
Anion gap: 7 (ref 5–15)
Anion gap: 9 (ref 5–15)
BUN: <5 mg/dL — ABNORMAL LOW (ref 6–20)
BUN: <5 mg/dL — ABNORMAL LOW (ref 6–20)
BUN: <5 mg/dL — ABNORMAL LOW (ref 6–20)
CO2: 19 mmol/L — ABNORMAL LOW (ref 22–32)
CO2: 21 mmol/L — ABNORMAL LOW (ref 22–32)
CO2: 21 mmol/L — ABNORMAL LOW (ref 22–32)
Calcium: 8.4 mg/dL — ABNORMAL LOW (ref 8.9–10.3)
Calcium: 8.3 mg/dL — ABNORMAL LOW (ref 8.9–10.3)
Calcium: 8.4 mg/dL — ABNORMAL LOW (ref 8.9–10.3)
Chloride: 108 mmol/L (ref 98–111)
Chloride: 109 mmol/L (ref 98–111)
Chloride: 106 mmol/L (ref 98–111)
Creatinine, Ser: 1.09 mg/dL — ABNORMAL HIGH (ref 0.44–1.00)
Creatinine, Ser: 1.09 mg/dL — ABNORMAL HIGH (ref 0.44–1.00)
Creatinine, Ser: 1.15 mg/dL — ABNORMAL HIGH (ref 0.44–1.00)
GFR, Estimated: >60 mL/min (ref >60)
GFR, Estimated: >60 mL/min (ref >60)
GFR, Estimated: 57 mL/min — ABNORMAL LOW (ref >60)
Glucose, Bld: 178 mg/dL — ABNORMAL HIGH (ref 70–99)
Glucose, Bld: 163 mg/dL — ABNORMAL HIGH (ref 70–99)
Glucose, Bld: 202 mg/dL — ABNORMAL HIGH (ref 70–99)
Potassium: 4.1 mmol/L (ref 3.5–5.1)
Potassium: 4.1 mmol/L (ref 3.5–5.1)
Potassium: 4.1 mmol/L (ref 3.5–5.1)
Sodium: 136 mmol/L (ref 135–145)
Sodium: 137 mmol/L (ref 135–145)
Sodium: 136 mmol/L (ref 135–145)

## 2022-12-23 LAB — GLUCOSE, CAPILLARY
Glucose-Capillary: 122 mg/dL — ABNORMAL HIGH (ref 70–99)
Glucose-Capillary: 128 mg/dL — ABNORMAL HIGH (ref 70–99)
Glucose-Capillary: 133 mg/dL — ABNORMAL HIGH (ref 70–99)
Glucose-Capillary: 138 mg/dL — ABNORMAL HIGH (ref 70–99)
Glucose-Capillary: 142 mg/dL — ABNORMAL HIGH (ref 70–99)
Glucose-Capillary: 144 mg/dL — ABNORMAL HIGH (ref 70–99)
Glucose-Capillary: 147 mg/dL — ABNORMAL HIGH (ref 70–99)
Glucose-Capillary: 158 mg/dL — ABNORMAL HIGH (ref 70–99)
Glucose-Capillary: 164 mg/dL — ABNORMAL HIGH (ref 70–99)
Glucose-Capillary: 171 mg/dL — ABNORMAL HIGH (ref 70–99)
Glucose-Capillary: 172 mg/dL — ABNORMAL HIGH (ref 70–99)
Glucose-Capillary: 189 mg/dL — ABNORMAL HIGH (ref 70–99)
Glucose-Capillary: 451 mg/dL — ABNORMAL HIGH (ref 70–99)

## 2022-12-23 LAB — URINE CULTURE: Culture: NO GROWTH

## 2022-12-23 MED ORDER — FLUOXETINE HCL 20 MG PO CAPS
60.0000 mg | ORAL_CAPSULE | Freq: Every day | ORAL | Status: DC
  Administered 2022-12-23 – 2022-12-26 (×4): 60 mg via ORAL
  Filled 2022-12-23: qty 3
  Filled 2022-12-23 (×2): qty 6
  Filled 2022-12-23: qty 3

## 2022-12-23 MED ORDER — DEXTROSE 10 % IV SOLN: INTRAVENOUS | Status: DC

## 2022-12-23 MED ORDER — ALUM & MAG HYDROXIDE-SIMETH 200-200-20 MG/5ML PO SUSP
15.0000 mL | Freq: Once | ORAL | Status: AC
  Administered 2022-12-23: 15 mL via ORAL
  Filled 2022-12-23: qty 30

## 2022-12-23 MED ORDER — MAGNESIUM OXIDE -MG SUPPLEMENT 400 (240 MG) MG PO TABS
400.0000 mg | ORAL_TABLET | Freq: Two times a day (BID) | ORAL | Status: AC
  Administered 2022-12-23 (×2): 400 mg via ORAL
  Filled 2022-12-23 (×2): qty 1

## 2022-12-23 NOTE — Progress Notes (Signed)
NAME:  Emily Park, MRN:  [id number], DOB:  [birth year], LOS: 2 ADMISSION DATE:  12/20/2022, CONSULTATION DATE:  12/21/22 REFERRING MD:  Randall An, CHIEF COMPLAINT:  AMS   History of Present Illness:  Emily Park is a 52 y/o woman with a PMH of septic shock in 2022, IBS, GERD,  anxiety who presented by EMS after her husband found her minimally responsive on the floor. She was at home today doing housework and they ate a normal dinner this evening. Her husband went to get frosties for dessert and when he returned she was on the floor with reduced level of consciousness, not able to speak. She has had 2 similar episodes in the past- once when she had septic shock and another episode they are unsure of the cause in the fall of 2023. She has been feeling in her usual state of health recently- no n/v/d, no cough or SOB, no dysuria or frequency. She has some discoloration on her left thigh from her heating pad, but otherwise no skin changes or rashes. She has not recently had medications changes or changed the supplements she takes. She works as a Engineer, civil (consulting).   With EMS she was hypotension with BP 70/48 and hypoglycemic with BG 22 with possible seizure-like activity. She was started on an epinephrine infusion and given 750cc NS + amp D50w.  In the ED she was given hydrocortisone and was weaned off the epinephrine infusion. Despite having normal BG transiently, she dropped back into the 20s again. PCCM was consulted for management.   Pertinent  Medical History  GERD IBS Anxiety Septic shock with relative adrenal insufficiency IDA Chronic leukocytosis  Significant Hospital Events: Including procedures, antibiotic start and stop dates in addition to other pertinent events   4/25 admitted- d10, steroids, Alfarata octreotide, empiric antibiotics  Interim History / Subjective:  Hemodynamically stable on room air Periods of agitation, required Ativan afternoon 4/26 CBGs 122-147, currently D20 at 150  cc/h Sulfonylurea assay remains pending States that she did not take anything, does not recall any events that led up to her hypoglycemic episode at home   Objective   Blood pressure (!) 137/100, pulse 99, temperature 98 F (36.7 C), temperature source Oral, resp. rate (!) 24, height 5' 4.75" (1.645 m), weight 63.9 kg, SpO2 98 %.        Intake/Output Summary (Last 24 hours) at 12/23/2022 0743 Last data filed at 12/23/2022 7829 Gross per 24 hour  Intake 3637.94 ml  Output 8700 ml  Net -5062.06 ml    Filed Weights   12/21/22 0800 12/22/22 0421 12/23/22 0402  Weight: 60.3 kg 62.4 kg 63.9 kg    Examination: General: No distress, laying in bed HENT: Oropharynx clear, strong voice, no secretions Lungs: Clear bilaterally Cardiovascular: Regular, no murmur Abdomen: Nondistended, positive bowel sounds Extremities: No edema Neuro: Awake, alert, comfortable, moves all extremities, follows commands    Resolved Hospital Problem list     Assessment & Plan:  Hypoglycemia, refractory to steroids, now on d20.  Evaluation consistent with exogenous insulin: Elevated insulin level, low C-peptide.  Patient's husband notes that the patient has mentioned taking insulin with intention at self-harm in the past. -Continue subcutaneous octreotide, question duration.  Will discuss with pharmacy -Hydrocortisone 100 mg every 12 hours -Begin to wean D20 on 4/27 and follow CBG  Suspected urinary tract infection based on UA, -Follow urine culture, sent but not yet resulted -Continue ceftriaxone  Hypokalemia; improved.  Suspect due to high insulin state -Following BMP -Repeat  electrolytes if indicated  Constipation, has history of IBS History of laxative abuse -Continue Linzess as ordered  Anxiety, depression.  Suspect a possible exogenous insulin.  Question suicidal ideation.  Having periods of anxiety/panic -Continue home doxepin, clonazepam -Ativan as needed -Will obtain psychiatric  evaluation when she is stable from acute events.  There is some overlay with the patient's husband.  He has stopped her insulin pump twice, has been advised not to alter the patient's medications.  Following closely  Anemia, chronic -Outpatient hematology evaluation  CKD 3a, stable -Follow BMP, urine output  Anemia, similar to baseline -monitor for bleeding -needs OP follow up with her Hematologist  Hyperthyroidism.  Variability noted in her TSH, elevated T4 and low TSH on this admission.  Seems to correlate with degree of critical illness based on past labs. -Consider treatment hyperthyroidism as she stabilizes from acute illness.  Consider methimazole   Best Practice (right click and "Reselect all SmartList Selections" daily)   Diet/type: Regular consistency (see orders) DVT prophylaxis: prophylactic heparin  GI prophylaxis: N/A Lines: N/A Foley:  N/A Code Status:  full code- d/w patient and her husband Last date of multidisciplinary goals of care discussion [4/25]  Labs   CBC: Recent Labs  Lab 12/20/22 2239 12/21/22 1140  WBC 14.5* 14.4*  NEUTROABS 12.7*  --   HGB 10.0* 10.0*  HCT 28.4* 28.9*  MCV 91.6 91.2  PLT 259 274     Basic Metabolic Panel: Recent Labs  Lab 12/20/22 2239 12/21/22 0112 12/21/22 1140 12/22/22 1153 12/22/22 1557 12/22/22 1959 12/23/22 0010 12/23/22 0400  NA 133* 137   < > 137 135 135 136 137  K <2.0* 2.6*   < > 5.8* 4.3 4.4 4.1 4.1  CL 105 106   < > 112* 109 108 108 109  CO2 19* 20*   < > 18* 18* 18* 19* 21*  GLUCOSE 96 136*   < > 183* 207* 163* 178* 163*  BUN 13 13   < > <5* <5* <5* <5* <5*  CREATININE 1.29* 1.03*   < > 1.04* 1.09* 1.12* 1.09* 1.09*  CALCIUM 7.9* 8.3*   < > 8.5* 8.4* 8.4* 8.4* 8.3*  MG 1.7 1.5*  --   --   --   --   --   --   PHOS  --  1.7*  --   --   --   --   --   --    < > = values in this interval not displayed.    GFR: Estimated Creatinine Clearance: 53.1 mL/min (A) (by C-G formula based on SCr of 1.09  mg/dL (H)). Recent Labs  Lab 12/20/22 2239 12/20/22 2253 12/21/22 0112 12/21/22 1140  WBC 14.5*  --   --  14.4*  LATICACIDVEN  --  2.5* 2.1*  --      Liver Function Tests: Recent Labs  Lab 12/20/22 2239  AST 26  ALT 13  ALKPHOS 51  BILITOT 0.4  PROT 5.7*  ALBUMIN 3.1*    No results for input(s): "LIPASE", "AMYLASE" in the last 168 hours. No results for input(s): "AMMONIA" in the last 168 hours.  ABG    Component Value Date/Time   HCO3 9.3 (L) 06/06/2022 0608   TCO2 10 (L) 06/06/2022 0608   ACIDBASEDEF 16.0 (H) 06/06/2022 0608   O2SAT 52 06/06/2022 0608     Coagulation Profile: Recent Labs  Lab 12/20/22 2239  INR 1.1     Cardiac Enzymes: No results for input(s): "CKTOTAL", "  CKMB", "CKMBINDEX", "TROPONINI" in the last 168 hours.  HbA1C: Hgb A1c MFr Bld  Date/Time Value Ref Range Status  12/20/2022 10:39 PM 4.9 4.8 - 5.6 % Final    Comment:    (NOTE) Pre diabetes:          5.7%-6.4%  Diabetes:              >6.4%  Glycemic control for   <7.0% adults with diabetes   07/06/2021 01:59 PM 4.9 4.8 - 5.6 % Final    Comment:    (NOTE) Pre diabetes:          5.7%-6.4%  Diabetes:              >6.4%  Glycemic control for   <7.0% adults with diabetes     CBG: Recent Labs  Lab 12/22/22 2205 12/23/22 0003 12/23/22 0014 12/23/22 0213 12/23/22 0409  GLUCAP 109* 451* 142* 122* 147*     Review of Systems:   Review of Systems  Constitutional:  Negative for chills and fever.  HENT:  Negative for congestion.   Respiratory:  Negative for cough and shortness of breath.   Gastrointestinal:  Positive for constipation. Negative for abdominal pain, nausea and vomiting.  Genitourinary: Negative.   Musculoskeletal: Negative.   Skin:  Negative for rash.  Neurological:  Positive for sensory change and speech change.     Past Medical History:  She,  has a past medical history of Abnormal TSH, ADD (attention deficit disorder), Anemia, Anxiety, Arrhythmia,  Decreased libido, Depression, Heart murmur, Hyperlipidemia, Hypokalemia, Migraine, Nephrolithiasis, Pap smear abnormality of cervix/human papillomavirus (HPV) positive, Sleep difficulties, and SVT (supraventricular tachycardia).   Surgical History:   Past Surgical History:  Procedure Laterality Date   ABLATION     Cardiac ablation, endometrial ablation   BREAST LUMPECTOMY     right breast   BREAST LUMPECTOMY Right    CARDIOVERSION     COLONOSCOPY WITH PROPOFOL N/A 05/16/2021   Procedure: COLONOSCOPY WITH PROPOFOL;  Surgeon: Charlott Rakes, MD;  Location: [facility];  Service: Endoscopy;  Laterality: N/A;   COLPOSCOPY     w/ cervical biopsy   DILITATION & CURRETTAGE/HYSTROSCOPY WITH NOVASURE ABLATION N/A 09/11/2014   Procedure: DILATATION & CURETTAGE/HYSTEROSCOPY WITH NOVASURE ABLATION;  Surgeon: Lenoard Aden, MD;  Location: [facility];  Service: Gynecology;  Laterality: N/A;   GIVENS CAPSULE STUDY N/A 05/16/2021   Procedure: GIVENS CAPSULE STUDY;  Surgeon: Charlott Rakes, MD;  Location: [facility];  Service: Endoscopy;  Laterality: N/A;   XI ROBOT ASSISTED RECTOPEXY N/A 07/07/2021   Procedure: XI ROBOT ASSISTED RECTOPEXY;  Surgeon: Karie Soda, MD;  Location: [facility];  Service: General;  Laterality: N/A;   XI ROBOTIC ASSISTED LOWER ANTERIOR RESECTION N/A 07/07/2021   Procedure: XI ROBOTIC ASSISTED LOW ANTERIOR RECTOSIGMOID RESECTION WITH RIGID PROCTOSCOPY, TAP BLOCK;  Surgeon: Karie Soda, MD;  Location: [facility];  Service: General;  Laterality: N/A;     Social History:   reports that she has never smoked. She has been exposed to tobacco smoke. She has never used smokeless tobacco. She reports that she does not currently use alcohol after a past usage of about 2.0 standard drinks of alcohol per week. She reports that she does not currently use drugs.   Family History:  Her family history includes AAA (abdominal aortic aneurysm) in her father; Breast cancer in her paternal  grandmother; Cancer in her mother; Depression in her mother; Diabetes in her maternal grandfather; Heart attack in her father; Hypertension in  her father; Hyperthyroidism in her sister; Hypothyroidism in her mother and sister; Thyroid disease in her mother.   Allergies Allergies  Allergen Reactions   Zithromax [Azithromycin] Rash     Home Medications  Prior to Admission medications   Medication Sig Start Date End Date Taking? Authorizing Provider  acetaminophen (TYLENOL) 500 MG tablet Take 1,000 mg by mouth daily as needed for headache.    [provider]  Ascorbic Acid (VITAMIN C PO) Take 1 tablet by mouth every morning.    [provider]  CALCIUM PO Take 1 tablet by mouth every morning.    [provider]  Cholecalciferol (VITAMIN D3) 50 MCG (2000 UT) capsule Take 2,000 Units by mouth daily.    [provider]  clonazePAM (KLONOPIN) 1 MG tablet Take 1 mg by mouth at bedtime. 01/20/21   [provider]  doxepin (SINEQUAN) 75 MG capsule Take 75 mg by mouth at bedtime. 12/22/20   [provider]  Ferric Maltol (ACCRUFER) 30 MG CAPS 1 po bid 10/26/22   Plotnikov, Georgina Quint, MD  hyoscyamine (LEVSIN SL) 0.125 MG SL tablet Place 0.25-0.5 mg under the tongue as needed for cramping. 04/05/22   [provider]  linaclotide (LINZESS) 145 MCG CAPS capsule Take 1 capsule (145 mcg total) by mouth 1 day or 1 dose. 11/22/22   Plotnikov, Georgina Quint, MD  LINZESS 290 MCG CAPS capsule Take 290 mcg by mouth daily. 08/10/21   [provider]  Magnesium Hydroxide (MILK OF MAGNESIA PO) Take 1 Dose by mouth as needed (constipation).    [provider]  Multiple Vitamin (MULTIVITAMIN WITH MINERALS) TABS tablet Take 1 tablet by mouth every morning.    [provider]  Multiple Vitamins-Minerals (ZINC PO) Take 1 tablet by mouth daily.    [provider]  NIACIN PO Take 1 tablet by mouth every morning.    [provider]  ondansetron (ZOFRAN-ODT) 8 MG disintegrating tablet Take 8 mg by mouth daily as needed for nausea or vomiting.    [provider]  pantoprazole (PROTONIX) 40 MG tablet Take 80 mg by mouth 2 (two) times daily. 04/08/21   [provider]  polyethylene glycol (MIRALAX / GLYCOLAX) 17 g packet Take 17 g by mouth 2 (two) times daily. Patient taking differently: Take 17 g by mouth daily as needed for moderate constipation. 07/10/21   Briant Cedar, MD  potassium chloride SA (KLOR-CON M) 20 MEQ tablet Take 2 tablets (40 mEq total) by mouth daily. Patient taking differently: Take 40 mEq by mouth in the morning, at noon, in the evening, and at bedtime. 03/03/22   Walisiewicz, Yvonna Alanis E, PA-C  rizatriptan (MAXALT) 10 MG tablet Take 1 tablet (10 mg total) by mouth once as needed for up to 1 dose for migraine. May repeat in 2 hours if needed 12/05/22   Plotnikov, Georgina Quint, MD  sodium fluoride (PREVIDENT 5000 PLUS) 1.1 % CREA dental cream Apply a pea sized amount to toothbrush & brush teeth thoroughly for 2 minutes.  Spit out excess & do not rinse. 12/07/22     vortioxetine HBr (TRINTELLIX) 20 MG TABS tablet Take 20 mg by mouth daily.    [provider]  XIFAXAN 550 MG TABS tablet Take 550 mg by mouth 3 (three) times daily.    [provider]     Critical care time: 31 minutes    This patient is critically ill with multiple organ system failure which requires frequent high  complexity decision making, assessment, support, evaluation, and titration of therapies. This was completed through the application of advanced monitoring technologies and extensive interpretation of multiple databases. During this encounter critical care time was devoted to patient care services described in this note for 31 minutes minutes.   Levy Pupa, MD, PhD 12/23/2022, 7:43 AM [facility] [phone] or if no answer before 7:00PM call [phone] For any issues  after 7:00PM please call eLink [phone]

## 2022-12-24 DIAGNOSIS — E162 Hypoglycemia, unspecified: Secondary | ICD-10-CM | POA: Diagnosis not present

## 2022-12-24 DIAGNOSIS — F418 Other specified anxiety disorders: Secondary | ICD-10-CM | POA: Diagnosis not present

## 2022-12-24 DIAGNOSIS — F411 Generalized anxiety disorder: Secondary | ICD-10-CM | POA: Diagnosis present

## 2022-12-24 LAB — GLUCOSE, CAPILLARY
Glucose-Capillary: 142 mg/dL — ABNORMAL HIGH (ref 70–99)
Glucose-Capillary: 143 mg/dL — ABNORMAL HIGH (ref 70–99)
Glucose-Capillary: 147 mg/dL — ABNORMAL HIGH (ref 70–99)
Glucose-Capillary: 125 mg/dL — ABNORMAL HIGH (ref 70–99)
Glucose-Capillary: 125 mg/dL — ABNORMAL HIGH (ref 70–99)
Glucose-Capillary: 124 mg/dL — ABNORMAL HIGH (ref 70–99)

## 2022-12-24 LAB — CBC
HCT: 31.3 % — ABNORMAL LOW (ref 36.0–46.0)
Hemoglobin: 10.3 g/dL — ABNORMAL LOW (ref 12.0–15.0)
MCH: 31 pg (ref 26.0–34.0)
MCHC: 32.9 g/dL (ref 30.0–36.0)
MCV: 94.3 fL (ref 80.0–100.0)
Platelets: 261 10*3/uL (ref 150–400)
RBC: 3.32 MIL/uL — ABNORMAL LOW (ref 3.87–5.11)
RDW: 13.9 % (ref 11.5–15.5)
WBC: 14.9 10*3/uL — ABNORMAL HIGH (ref 4.0–10.5)
nRBC: 0 % (ref 0.0–0.2)

## 2022-12-24 LAB — BASIC METABOLIC PANEL
Anion gap: 12 (ref 5–15)
BUN: 6 mg/dL (ref 6–20)
CO2: 27 mmol/L (ref 22–32)
Calcium: 8.3 mg/dL — ABNORMAL LOW (ref 8.9–10.3)
Chloride: 100 mmol/L (ref 98–111)
Creatinine, Ser: 1.08 mg/dL — ABNORMAL HIGH (ref 0.44–1.00)
GFR, Estimated: >60 mL/min (ref >60)
Glucose, Bld: 150 mg/dL — ABNORMAL HIGH (ref 70–99)
Potassium: 3.7 mmol/L (ref 3.5–5.1)
Sodium: 139 mmol/L (ref 135–145)

## 2022-12-24 LAB — MAGNESIUM: Magnesium: 1.9 mg/dL (ref 1.7–2.4)

## 2022-12-24 LAB — PHOSPHORUS: Phosphorus: 3.8 mg/dL (ref 2.5–4.6)

## 2022-12-24 LAB — CULTURE, BLOOD (ROUTINE X 2)

## 2022-12-24 MED ORDER — HYDROCORTISONE SOD SUC (PF) 100 MG IJ SOLR
50.0000 mg | Freq: Three times a day (TID) | INTRAMUSCULAR | Status: DC
  Administered 2022-12-24 – 2022-12-26 (×6): 50 mg via INTRAVENOUS
  Filled 2022-12-24 (×9): qty 1

## 2022-12-24 MED ORDER — ORAL CARE MOUTH RINSE: 15.0000 mL | OROMUCOSAL | Status: DC | PRN

## 2022-12-24 NOTE — Consult Note (Addendum)
[facility] Face-to-Face Psychiatry Consult   Reason for Consult:'' depression, anxiety, insulin overdose -denying use, question suicidal intent, history of previous similar attempts.'' Referring Physician:  Joycelyn Das, MD Patient Identification: Emily Park MRN:  [id number] Principal Diagnosis: Hypoglycemia Diagnosis:  Principal Problem:   Hypoglycemia Active Problems:   Generalized anxiety disorder   Total Time spent with patient: 1 hour  Subjective:   Emily Park is a 52 y.o. female patient admitted with syncope, loss of consciousness.  HPI:  52 y/o woman who reports PMH of septic shock in 2022, IBS, GERD, Hyperlipidemia, depression and anxiety who was brought to the hospital by EMS after her husband found her minimally responsive on the floor at home. Patient unable to remember the sequence of events that lead to her current admission other than she was told that her blood sugar and blood pressure were low on admission. 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 She also reports that she has been seeing a psychiatrist Dr. Milas Hock for the treatment of her anxiety and depression and believe she is currently stable.  She denies prior history of suicide attempt and reject the insinuation that she took any of her medications to harm herself prior to current hospitalization. In addition, patient denies drugs/alcohol use, delusions, psychosis, mood swings, suicidal or homicidal ideations, intent or plan.   Past Psychiatric History: as above  Risk to Self:  denies Risk to Others:  denies Prior Inpatient Therapy:  denies Prior Outpatient Therapy:  yes with Rapinder Kaur-Psychiatrist  Past Medical History:  Past Medical History:  Diagnosis Date   Abnormal TSH    ADD (attention deficit disorder)    Anemia    Anxiety    Arrhythmia    Decreased libido    Depression    Heart murmur    Hyperlipidemia    Hypokalemia    Migraine     Nephrolithiasis    Pap smear abnormality of cervix/human papillomavirus (HPV) positive    Sleep difficulties    SVT (supraventricular tachycardia)     Past Surgical History:  Procedure Laterality Date   ABLATION     Cardiac ablation, endometrial ablation   BREAST LUMPECTOMY     right breast   BREAST LUMPECTOMY Right    CARDIOVERSION     COLONOSCOPY WITH PROPOFOL N/A 05/16/2021   Procedure: COLONOSCOPY WITH PROPOFOL;  Surgeon: Charlott Rakes, MD;  Location: [facility];  Service: Endoscopy;  Laterality: N/A;   COLPOSCOPY     w/ cervical biopsy   DILITATION & CURRETTAGE/HYSTROSCOPY WITH NOVASURE ABLATION N/A 09/11/2014   Procedure: DILATATION & CURETTAGE/HYSTEROSCOPY WITH NOVASURE ABLATION;  Surgeon: Lenoard Aden, MD;  Location: [facility];  Service: Gynecology;  Laterality: N/A;   GIVENS CAPSULE STUDY N/A 05/16/2021   Procedure: GIVENS CAPSULE STUDY;  Surgeon: Charlott Rakes, MD;  Location: [facility];  Service: Endoscopy;  Laterality: N/A;   XI ROBOT ASSISTED RECTOPEXY N/A 07/07/2021   Procedure: XI ROBOT ASSISTED RECTOPEXY;  Surgeon: Karie Soda, MD;  Location: [facility];  Service: General;  Laterality: N/A;   XI ROBOTIC ASSISTED LOWER ANTERIOR RESECTION N/A 07/07/2021   Procedure: XI ROBOTIC ASSISTED LOW ANTERIOR RECTOSIGMOID RESECTION WITH RIGID PROCTOSCOPY, TAP BLOCK;  Surgeon: Karie Soda, MD;  Location: [facility];  Service: General;  Laterality: N/A;   Family History:  Family History  Problem Relation Age of Onset   Hypothyroidism Mother    Depression Mother    Cancer Mother  thyroid   Thyroid disease Mother    Hypertension Father    Heart attack Father        Age 64   AAA (abdominal aortic aneurysm) Father    Hypothyroidism Sister    Hyperthyroidism Sister    Diabetes Maternal Grandfather    Breast cancer Paternal Grandmother    Family Psychiatric  History:  Social History:  Social History   Substance and Sexual Activity  Alcohol Use Not Currently    Alcohol/week: 2.0 standard drinks of alcohol   Types: 2 Glasses of wine per week   Comment: occasional wine     Social History   Substance and Sexual Activity  Drug Use Not Currently    Social History   Socioeconomic History   Marital status: Married    Spouse name: Not on file   Number of children: 2   Years of education: college   Highest education level: Not on file  Occupational History   Occupation: Charity fundraiser with Engineer, manufacturing systems: [employer]   Tobacco Use   Smoking status: Never    Passive exposure: Current   Smokeless tobacco: Never  Vaping Use   Vaping Use: Never used  Substance and Sexual Activity   Alcohol use: Not Currently    Alcohol/week: 2.0 standard drinks of alcohol    Types: 2 Glasses of wine per week    Comment: occasional wine   Drug use: Not Currently   Sexual activity: Yes    Partners: Male    Comment: Married  Other Topics Concern   Not on file  Social History Narrative   Lives at home with two sons.   Right-handed.   60 ounces of tea and soda per day.   Social Determinants of Health   Financial Resource Strain: Not on file  Food Insecurity: Food Insecurity Present (06/06/2022)   Hunger Vital Sign    Worried About Running Out of Food in the Last Year: Often true    Ran Out of Food in the Last Year: Often true  Transportation Needs: No Transportation Needs (06/06/2022)   PRAPARE - Administrator, Civil Service (Medical): No    Lack of Transportation (Non-Medical): No  Physical Activity: Not on file  Stress: Not on file  Social Connections: Not on file   Additional Social History:    Allergies:   Allergies  Allergen Reactions   Zithromax [Azithromycin] Rash    Labs:  Results for orders placed or performed during the hospital encounter of 12/20/22 (from the past 48 hour(s))  Glucose, capillary     Status: None   Collection Time: 12/22/22  2:30 PM  Result Value Ref Range   Glucose-Capillary 77 70 - 99 mg/dL    Comment: Glucose  reference range applies only to samples taken after fasting for at least 8 hours.  Basic metabolic panel     Status: Abnormal   Collection Time: 12/22/22  3:57 PM  Result Value Ref Range   Sodium 135 135 - 145 mmol/L   Potassium 4.3 3.5 - 5.1 mmol/L   Chloride 109 98 - 111 mmol/L   CO2 18 (L) 22 - 32 mmol/L   Glucose, Bld 207 (H) 70 - 99 mg/dL    Comment: Glucose reference range applies only to samples taken after fasting for at least 8 hours.   BUN <5 (L) 6 - 20 mg/dL   Creatinine, Ser 9.60 (H) 0.44 - 1.00 mg/dL   Calcium 8.4 (L) 8.9 - 10.3 mg/dL  GFR, Estimated >60 >60 mL/min    Comment: (NOTE) Calculated using the CKD-EPI Creatinine Equation (2021)    Anion gap 8 5 - 15    Comment: Performed at [facility], [address], [place], [place] [address]  Glucose, capillary     Status: Abnormal   Collection Time: 12/22/22  4:07 PM  Result Value Ref Range   Glucose-Capillary 180 (H) 70 - 99 mg/dL    Comment: Glucose reference range applies only to samples taken after fasting for at least 8 hours.  Glucose, capillary     Status: Abnormal   Collection Time: 12/22/22  5:41 PM  Result Value Ref Range   Glucose-Capillary 218 (H) 70 - 99 mg/dL    Comment: Glucose reference range applies only to samples taken after fasting for at least 8 hours.  Glucose, capillary     Status: Abnormal   Collection Time: 12/22/22  7:58 PM  Result Value Ref Range   Glucose-Capillary 141 (H) 70 - 99 mg/dL    Comment: Glucose reference range applies only to samples taken after fasting for at least 8 hours.  Basic metabolic panel     Status: Abnormal   Collection Time: 12/22/22  7:59 PM  Result Value Ref Range   Sodium 135 135 - 145 mmol/L   Potassium 4.4 3.5 - 5.1 mmol/L   Chloride 108 98 - 111 mmol/L   CO2 18 (L) 22 - 32 mmol/L   Glucose, Bld 163 (H) 70 - 99 mg/dL    Comment: Glucose reference range applies only to samples taken after fasting for at least 8 hours.   BUN <5 (L) 6 - 20 mg/dL    Creatinine, Ser 6.04 (H) 0.44 - 1.00 mg/dL   Calcium 8.4 (L) 8.9 - 10.3 mg/dL   GFR, Estimated 59 (L) >60 mL/min    Comment: (NOTE) Calculated using the CKD-EPI Creatinine Equation (2021)    Anion gap 9 5 - 15    Comment: Performed at [facility], [address], [place], [place] [address]  Glucose, capillary     Status: Abnormal   Collection Time: 12/22/22 10:05 PM  Result Value Ref Range   Glucose-Capillary 109 (H) 70 - 99 mg/dL    Comment: Glucose reference range applies only to samples taken after fasting for at least 8 hours.  Glucose, capillary     Status: Abnormal   Collection Time: 12/23/22 12:03 AM  Result Value Ref Range   Glucose-Capillary 451 (H) 70 - 99 mg/dL    Comment: Glucose reference range applies only to samples taken after fasting for at least 8 hours.  Basic metabolic panel     Status: Abnormal   Collection Time: 12/23/22 12:10 AM  Result Value Ref Range   Sodium 136 135 - 145 mmol/L   Potassium 4.1 3.5 - 5.1 mmol/L   Chloride 108 98 - 111 mmol/L   CO2 19 (L) 22 - 32 mmol/L   Glucose, Bld 178 (H) 70 - 99 mg/dL    Comment: Glucose reference range applies only to samples taken after fasting for at least 8 hours.   BUN <5 (L) 6 - 20 mg/dL   Creatinine, Ser 1.19 (H) 0.44 - 1.00 mg/dL   Calcium 8.4 (L) 8.9 - 10.3 mg/dL   GFR, Estimated >14 >78 mL/min    Comment: (NOTE) Calculated using the CKD-EPI Creatinine Equation (2021)    Anion gap 9 5 - 15    Comment: Performed at [facility], [address], [place], [place] [address]  Glucose, capillary     Status: Abnormal   Collection Time: 12/23/22 12:14 AM  Result Value Ref Range   Glucose-Capillary 142 (H) 70 - 99 mg/dL    Comment: Glucose reference range applies only to samples taken after fasting for at least 8 hours.  Glucose, capillary     Status: Abnormal   Collection Time: 12/23/22  2:13 AM  Result Value Ref Range   Glucose-Capillary 122 (H) 70 - 99 mg/dL    Comment: Glucose reference range  applies only to samples taken after fasting for at least 8 hours.  Basic metabolic panel     Status: Abnormal   Collection Time: 12/23/22  4:00 AM  Result Value Ref Range   Sodium 137 135 - 145 mmol/L   Potassium 4.1 3.5 - 5.1 mmol/L   Chloride 109 98 - 111 mmol/L   CO2 21 (L) 22 - 32 mmol/L   Glucose, Bld 163 (H) 70 - 99 mg/dL    Comment: Glucose reference range applies only to samples taken after fasting for at least 8 hours.   BUN <5 (L) 6 - 20 mg/dL   Creatinine, Ser 6.04 (H) 0.44 - 1.00 mg/dL   Calcium 8.3 (L) 8.9 - 10.3 mg/dL   GFR, Estimated >54 >09 mL/min    Comment: (NOTE) Calculated using the CKD-EPI Creatinine Equation (2021)    Anion gap 7 5 - 15    Comment: Performed at [facility], [address] [place], [place] [address]  Glucose, capillary     Status: Abnormal   Collection Time: 12/23/22  4:09 AM  Result Value Ref Range   Glucose-Capillary 147 (H) 70 - 99 mg/dL    Comment: Glucose reference range applies only to samples taken after fasting for at least 8 hours.  Glucose, capillary     Status: Abnormal   Collection Time: 12/23/22  5:59 AM  Result Value Ref Range   Glucose-Capillary 158 (H) 70 - 99 mg/dL    Comment: Glucose reference range applies only to samples taken after fasting for at least 8 hours.  Glucose, capillary     Status: Abnormal   Collection Time: 12/23/22  8:08 AM  Result Value Ref Range   Glucose-Capillary 189 (H) 70 - 99 mg/dL    Comment: Glucose reference range applies only to samples taken after fasting for at least 8 hours.  Basic metabolic panel     Status: Abnormal   Collection Time: 12/23/22  8:10 AM  Result Value Ref Range   Sodium 136 135 - 145 mmol/L   Potassium 4.1 3.5 - 5.1 mmol/L   Chloride 106 98 - 111 mmol/L   CO2 21 (L) 22 - 32 mmol/L   Glucose, Bld 202 (H) 70 - 99 mg/dL    Comment: Glucose reference range applies only to samples taken after fasting for at least 8 hours.   BUN <5 (L) 6 - 20 mg/dL   Creatinine, Ser  4.78 (H) 0.44 - 1.00 mg/dL   Calcium 8.4 (L) 8.9 - 10.3 mg/dL   GFR, Estimated 57 (L) >60 mL/min    Comment: (NOTE) Calculated using the CKD-EPI Creatinine Equation (2021)    Anion gap 9 5 - 15    Comment: Performed at [facility], [address] [place], [place] [address]  Glucose, capillary     Status: Abnormal   Collection Time: 12/23/22 10:02 AM  Result Value Ref Range   Glucose-Capillary  164 (H) 70 - 99 mg/dL    Comment: Glucose reference range applies only to samples taken after fasting for at least 8 hours.  Glucose, capillary     Status: Abnormal   Collection Time: 12/23/22 11:40 AM  Result Value Ref Range   Glucose-Capillary 171 (H) 70 - 99 mg/dL    Comment: Glucose reference range applies only to samples taken after fasting for at least 8 hours.  Glucose, capillary     Status: Abnormal   Collection Time: 12/23/22  2:46 PM  Result Value Ref Range   Glucose-Capillary 133 (H) 70 - 99 mg/dL    Comment: Glucose reference range applies only to samples taken after fasting for at least 8 hours.  Glucose, capillary     Status: Abnormal   Collection Time: 12/23/22  4:14 PM  Result Value Ref Range   Glucose-Capillary 172 (H) 70 - 99 mg/dL    Comment: Glucose reference range applies only to samples taken after fasting for at least 8 hours.  Glucose, capillary     Status: Abnormal   Collection Time: 12/23/22  7:23 PM  Result Value Ref Range   Glucose-Capillary 128 (H) 70 - 99 mg/dL    Comment: Glucose reference range applies only to samples taken after fasting for at least 8 hours.  Glucose, capillary     Status: Abnormal   Collection Time: 12/23/22  9:48 PM  Result Value Ref Range   Glucose-Capillary 138 (H) 70 - 99 mg/dL    Comment: Glucose reference range applies only to samples taken after fasting for at least 8 hours.  Glucose, capillary     Status: Abnormal   Collection Time: 12/23/22 11:33 PM  Result Value Ref Range   Glucose-Capillary 144 (H) 70 - 99 mg/dL     Comment: Glucose reference range applies only to samples taken after fasting for at least 8 hours.  Glucose, capillary     Status: Abnormal   Collection Time: 12/24/22  3:35 AM  Result Value Ref Range   Glucose-Capillary 142 (H) 70 - 99 mg/dL    Comment: Glucose reference range applies only to samples taken after fasting for at least 8 hours.  CBC     Status: Abnormal   Collection Time: 12/24/22  3:58 AM  Result Value Ref Range   WBC 14.9 (H) 4.0 - 10.5 K/uL   RBC 3.32 (L) 3.87 - 5.11 MIL/uL   Hemoglobin 10.3 (L) 12.0 - 15.0 g/dL   HCT 16.1 (L) 09.6 - 04.5 %   MCV 94.3 80.0 - 100.0 fL   MCH 31.0 26.0 - 34.0 pg   MCHC 32.9 30.0 - 36.0 g/dL   RDW 40.9 81.1 - 91.4 %   Platelets 261 150 - 400 K/uL   nRBC 0.0 0.0 - 0.2 %    Comment: Performed at [facility], [address] [place], [place] [address]  Basic metabolic panel     Status: Abnormal   Collection Time: 12/24/22  3:58 AM  Result Value Ref Range   Sodium 139 135 - 145 mmol/L   Potassium 3.7 3.5 - 5.1 mmol/L   Chloride 100 98 - 111 mmol/L   CO2 27 22 - 32 mmol/L   Glucose, Bld 150 (H) 70 - 99 mg/dL    Comment: Glucose reference range applies only to samples taken after fasting for at least 8 hours.   BUN 6 6 - 20 mg/dL   Creatinine, Ser 6.21 (H) 0.44 - 1.00 mg/dL  Calcium 8.3 (L) 8.9 - 10.3 mg/dL   GFR, Estimated >16 >10 mL/min    Comment: (NOTE) Calculated using the CKD-EPI Creatinine Equation (2021)    Anion gap 12 5 - 15    Comment: Performed at [facility], [address], [place], [place] [address]  Magnesium     Status: None   Collection Time: 12/24/22  3:58 AM  Result Value Ref Range   Magnesium 1.9 1.7 - 2.4 mg/dL    Comment: Performed at [facility], [address], [place], [place] [address]  Phosphorus     Status: None   Collection Time: 12/24/22  3:58 AM  Result Value Ref Range   Phosphorus 3.8 2.5 - 4.6 mg/dL    Comment: Performed at [facility], [address],  [place], [place] [address]  Glucose, capillary     Status: Abnormal   Collection Time: 12/24/22  7:43 AM  Result Value Ref Range   Glucose-Capillary 143 (H) 70 - 99 mg/dL    Comment: Glucose reference range applies only to samples taken after fasting for at least 8 hours.  Glucose, capillary     Status: Abnormal   Collection Time: 12/24/22 11:13 AM  Result Value Ref Range   Glucose-Capillary 147 (H) 70 - 99 mg/dL    Comment: Glucose reference range applies only to samples taken after fasting for at least 8 hours.    Current Facility-Administered Medications  Medication Dose Route Frequency Provider Last Rate Last Admin   Chlorhexidine Gluconate Cloth 2 % PADS 6 each  6 each Topical Daily Leslye Peer, MD   6 each at 12/24/22 0000   clonazePAM (KLONOPIN) tablet 1 mg  1 mg Oral QHS Leslye Peer, MD   1 mg at 12/23/22 2136   dextrose 10 % infusion   Intravenous Continuous Leslye Peer, MD 50 mL/hr at 12/24/22 1200 Infusion Verify at 12/24/22 1200   diphenhydrAMINE (BENADRYL) capsule 25 mg  25 mg Oral Q6H PRN Leslye Peer, MD   25 mg at 12/24/22 1335   docusate sodium (COLACE) capsule 100 mg  100 mg Oral BID PRN Leslye Peer, MD   100 mg at 12/23/22 1723   doxepin (SINEQUAN) capsule 75 mg  75 mg Oral QHS Leslye Peer, MD   75 mg at 12/23/22 2136   FLUoxetine (PROZAC) capsule 60 mg  60 mg Oral Daily Leslye Peer, MD   60 mg at 12/24/22 0915   heparin injection 5,000 Units  5,000 Units Subcutaneous Q8H Leslye Peer, MD   5,000 Units at 12/24/22 1331   hydrocortisone sodium succinate (SOLU-CORTEF) 100 MG injection 50 mg  50 mg Intravenous Q8H Pokhrel, Laxman, MD       ondansetron (ZOFRAN) injection 4 mg  4 mg Intravenous Q8H PRN Leslye Peer, MD   4 mg at 12/22/22 0947   Oral care mouth rinse  15 mL Mouth Rinse PRN Pokhrel, Laxman, MD       polyethylene glycol (MIRALAX / GLYCOLAX) packet 17 g  17 g Oral Daily PRN Leslye Peer, MD   17 g at 12/23/22 1723   sodium chloride  flush (NS) 0.9 % injection 10-40 mL  10-40 mL Intracatheter Q12H Leslye Peer, MD   10 mL at 12/24/22 0917   sodium chloride flush (NS) 0.9 % injection 10-40 mL  10-40 mL Intracatheter PRN Leslye Peer, MD        Musculoskeletal: Strength &  Muscle Tone: within normal limits Gait & Station: normal Patient leans: N/A    Psychiatric Specialty Exam:  Presentation  General Appearance:  Appropriate for Environment; Well Groomed  Eye Contact: Good  Speech: Clear and Coherent  Speech Volume: Normal  Handedness: Right   Mood and Affect  Mood: Euthymic  Affect: Appropriate   Thought Process  Thought Processes: Goal Directed; Linear  Descriptions of Associations:Intact  Orientation:Full (Time, Place and Person)  Thought Content:Logical  History of Schizophrenia/Schizoaffective disorder:No data recorded Duration of Psychotic Symptoms:No data recorded Hallucinations:Hallucinations: None  Ideas of Reference:None  Suicidal Thoughts:Suicidal Thoughts: No  Homicidal Thoughts:Homicidal Thoughts: No   Sensorium  Memory: Immediate Good; Recent Good; Remote Good  Judgment: Intact  Insight: Fair   Art therapist  Concentration: Good  Attention Span: Good  Recall: Good  Fund of Knowledge: Good  Language: Good   Psychomotor Activity  Psychomotor Activity: Psychomotor Activity: Normal   Assets  Assets: Communication Skills; Social Support   Sleep  Sleep: Sleep: Fair   Physical Exam: Physical Exam Review of Systems  Psychiatric/Behavioral:  Negative for depression, hallucinations, memory loss, substance abuse and suicidal ideas. The patient is not nervous/anxious and does not have insomnia.    Blood pressure (!) 144/94, pulse 94, temperature 98.3 F (36.8 C), temperature source Oral, resp. rate (!) 32, height 5' 4.75" (1.645 m), weight 63 kg, SpO2 95 %. Body mass index is 23.29 kg/m.  Treatment Plan Summary: 52 year old  female with multiple medical issue and history of anxiety and depression who claim to be stable on her current medications. She was admitted for low blood sugar and blood pressure. However, patient is alert, awake, oriented and she denies self harming thoughts, delusions and psychosis. Based on my evaluation today, I did not see any evidenced of current self harming thoughts or thoughts of harming others.  Recommendations: -Continue Prozac 60 mg daily for anxiety/depression -Consider referring patient to her outpatient psychiatrist-Rapinder Evelene Croon, MD after she is medically stable   Disposition: No evidence of imminent risk to self or others at present.   Patient does not meet criteria for psychiatric inpatient admission. Supportive therapy provided about ongoing stressors. Psychiatric consult service signing out. Re-consult as needed  Thedore Mins, MD 12/24/2022 1:41 PM

## 2022-12-24 NOTE — Progress Notes (Signed)
Suicide precaution orders are in. Room cleared, all belongings were giving back to pt's husband. Education provided. Sitter at the bedside

## 2022-12-24 NOTE — Progress Notes (Signed)
PROGRESS NOTE    Emily Park  WUJ:[id number] DOB: 03-Aug-1971 DOA: 12/20/2022 PCP: Tresa Garter, MD    Brief Narrative:  Emily Park is a 52 years old female with past medical history of  septic shock in 2022, irritable bowel syndrome, GERD,  anxiety who presented to the hospital after being found minimally responsive on by her husband.  After dinner, patient's husband had gone out to get to his when she returned he was unconscious and unable to talk. She has had 2 similar episodes in the past- once when she had septic shock and another episode they are unsure of the cause in the fall of 2023.  No recent illness reported.  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  EMS was called in a.m. patient was noted to be hypotensive with BP 70/48 and hypoglycemic with BG 22 with possible seizure-like activity. She was started on an epinephrine infusion and given 750cc NS + amp D50w.  In the ED, she was given hydrocortisone and was weaned off the epinephrine infusion. Despite having normal BG transiently, she dropped back into the 20s again so pulmonary critical care was consulted and patient was admitted to ICU.  Patient was subsequently considered stable out of the ICU but was still on D10 infusion.   Assessment and Plan:  Hypoglycemia, refractory to steroids, now on D10 infusion.   Patient had elevated insulin level with low C-peptide at 0.2.  History of taking insulin in the past for self-harm.  Patient  on subcu octreotide, receiving hydrocortisone 100 mg every 6hrly and is currently on D10 infusion.  Latest POC at 147 with infusion.  Will decrease hydrocortisone, discontinue octreotide.  Patient was encouraged to increase oral intake.   Possible UTI. On Rocephin IV.  Follow urine culture.   Hypokalemia Improved at this time.  Thought to be secondary to high insulin level.  Constipation, has history of IBS History of laxative abuse Continue  Linzess.   Anxiety, depression.  Patient with history of anxiety and panic disorder.  Question suicidal ideation. Continue doxepin Klonopin Ativan.  Psychiatry was consulted and no specific recommendations made at this time.   Anemia, chronic Hemoglobin of 10.3.  Will need to monitor as outpatient.  CKD 3a, stable Will monitor closely.  Latest creatinine at 1.08.  Hyperthyroidism.  Will need to reassess her thyroid function test as outpatient to decide on treatment.  Free T4 of 2.1 with a TSH low at 0.1.   DVT prophylaxis: heparin injection 5,000 Units Start: 12/21/22 0600 SCDs Start: 12/21/22 0103   Code Status:     Code Status: Full Code  Disposition: Home likely on 12/24/2020 . Will transfer to medical telemetry.   Status is: Inpatient  Remains inpatient appropriate because: Significant hypoglycemia on D10 infusion, octreotide,   Family Communication:   Spoke with the husband at length.  He is concerned that patient is mentally unstable with episodes of anxiety, depression, panic attacks.  Patient follows up with Dr. Carola Rhine as outpatient appointment is far away.  Patient has attempted any treatment in the past to harm herself by using insulin.  Consultants:  PCCM Psychiatry.  Procedures:  None  Antimicrobials:  Rocephin IV  Anti-infectives (From admission, onward)    Start     Dose/Rate Route Frequency Ordered Stop   12/22/22 0000  cefTRIAXone (ROCEPHIN) 2 g in sodium chloride 0.9 % 100 mL IVPB  2 g 200 mL/hr over 30 Minutes Intravenous Every 24 hours 12/21/22 0155     12/21/22 0800  cefTRIAXone (ROCEPHIN) 2 g in sodium chloride 0.9 % 100 mL IVPB  Status:  Discontinued        2 g 200 mL/hr over 30 Minutes Intravenous Every 24 hours 12/21/22 0114 12/21/22 0116   12/21/22 0200  cefTRIAXone (ROCEPHIN) 2 g in sodium chloride 0.9 % 100 mL IVPB  Status:  Discontinued        2 g 200 mL/hr over 30 Minutes Intravenous Daily 12/21/22 0116 12/21/22 0147   12/21/22 0200   cefTRIAXone (ROCEPHIN) 2 g in sodium chloride 0.9 % 100 mL IVPB        2 g 200 mL/hr over 30 Minutes Intravenous  Once 12/21/22 0155 12/21/22 0249   12/21/22 0100  vancomycin (VANCOREADY) IVPB 1250 mg/250 mL  Status:  Discontinued        1,250 mg 166.7 mL/hr over 90 Minutes Intravenous  Once 12/21/22 0059 12/21/22 0114   12/21/22 0100  ceFEPIme (MAXIPIME) 2 g in sodium chloride 0.9 % 100 mL IVPB  Status:  Discontinued        2 g 200 mL/hr over 30 Minutes Intravenous  Once 12/21/22 0059 12/21/22 0114      Subjective: Today, patient was seen and examined at bedside.  Patient denies any nausea, vomiting , fever, chills or rigor.  Still on D10 drip.  Has had something to eat.  Objective: Vitals:   12/24/22 0900 12/24/22 1000 12/24/22 1100 12/24/22 1118  BP: (!) 142/103 (!) 147/99 (!) 146/94   Pulse: 91 72 84   Resp: (!) 21 18 18    Temp:    98.3 F (36.8 C)  TempSrc:    Oral  SpO2: 97% 100% 95%   Weight:      Height:        Intake/Output Summary (Last 24 hours) at 12/24/2022 1119 Last data filed at 12/24/2022 0741 Gross per 24 hour  Intake 1891.47 ml  Output 1050 ml  Net 841.47 ml   Filed Weights   12/22/22 0421 12/23/22 0402 12/24/22 0408  Weight: 62.4 kg 63.9 kg 63 kg    Physical Examination: Body mass index is 23.29 kg/m.   General:  Average built, not in obvious distress, appears anxious HENT:   No scleral pallor or icterus noted. Oral mucosa is moist.  Chest:  Clear breath sounds.   No crackles or wheezes.  CVS: S1 &S2 heard. No murmur.  Regular rate and rhythm. Abdomen: Soft, nontender, nondistended.  Bowel sounds are heard.   Extremities: No cyanosis, clubbing or edema.  Peripheral pulses are palpable. Psych: Alert, awake appears anxious, low mood CNS:  No cranial nerve deficits.  Power equal in all extremities.   Skin: Warm and dry.  No rashes noted.  Data Reviewed:   CBC: Recent Labs  Lab 12/20/22 2239 12/21/22 1140 12/24/22 0358  WBC 14.5* 14.4* 14.9*   NEUTROABS 12.7*  --   --   HGB 10.0* 10.0* 10.3*  HCT 28.4* 28.9* 31.3*  MCV 91.6 91.2 94.3  PLT 259 274 261    Basic Metabolic Panel: Recent Labs  Lab 12/20/22 2239 12/21/22 0112 12/21/22 1140 12/22/22 1959 12/23/22 0010 12/23/22 0400 12/23/22 0810 12/24/22 0358  NA 133* 137   < > 135 136 137 136 139  K <2.0* 2.6*   < > 4.4 4.1 4.1 4.1 3.7  CL 105 106   < > 108 108 109 106 100  CO2 19* 20*   < > 18* 19* 21* 21* 27  GLUCOSE 96 136*   < > 163* 178* 163* 202* 150*  BUN 13 13   < > <5* <5* <5* <5* 6  CREATININE 1.29* 1.03*   < > 1.12* 1.09* 1.09* 1.15* 1.08*  CALCIUM 7.9* 8.3*   < > 8.4* 8.4* 8.3* 8.4* 8.3*  MG 1.7 1.5*  --   --   --   --   --  1.9  PHOS  --  1.7*  --   --   --   --   --  3.8   < > = values in this interval not displayed.    Liver Function Tests: Recent Labs  Lab 12/20/22 2239  AST 26  ALT 13  ALKPHOS 51  BILITOT 0.4  PROT 5.7*  ALBUMIN 3.1*     Radiology Studies: No results found.    LOS: 3 days    Joycelyn Das, MD [facility] Available via Epic secure chat 7am-7pm After these hours, please refer to coverage provider listed on [email or web address] 12/24/2022, 11:19 AM

## 2022-12-24 NOTE — Progress Notes (Signed)
Husband called, he wants to speak again to the Doctor tomorrow about the patient condition.

## 2022-12-24 NOTE — Progress Notes (Signed)
Pt husband came out from the room and expressed to me that Pt wants to talk to the doctor. When was asked which doctor they wanted to see and talk to, husband said: "Well the one who..., she admits that she done it to  herself". MD and Phych notified and aware. See need orders

## 2022-12-25 ENCOUNTER — Other Ambulatory Visit (HOSPITAL_COMMUNITY): Payer: Self-pay

## 2022-12-25 DIAGNOSIS — K581 Irritable bowel syndrome with constipation: Secondary | ICD-10-CM

## 2022-12-25 DIAGNOSIS — F411 Generalized anxiety disorder: Secondary | ICD-10-CM

## 2022-12-25 DIAGNOSIS — N1831 Chronic kidney disease, stage 3a: Secondary | ICD-10-CM | POA: Diagnosis not present

## 2022-12-25 DIAGNOSIS — D649 Anemia, unspecified: Secondary | ICD-10-CM | POA: Diagnosis not present

## 2022-12-25 DIAGNOSIS — E162 Hypoglycemia, unspecified: Secondary | ICD-10-CM | POA: Diagnosis not present

## 2022-12-25 DIAGNOSIS — F332 Major depressive disorder, recurrent severe without psychotic features: Secondary | ICD-10-CM | POA: Diagnosis present

## 2022-12-25 LAB — CULTURE, BLOOD (ROUTINE X 2): Special Requests: ADEQUATE

## 2022-12-25 LAB — GLUCOSE, CAPILLARY
Glucose-Capillary: 128 mg/dL — ABNORMAL HIGH (ref 70–99)
Glucose-Capillary: 143 mg/dL — ABNORMAL HIGH (ref 70–99)
Glucose-Capillary: 120 mg/dL — ABNORMAL HIGH (ref 70–99)
Glucose-Capillary: 107 mg/dL — ABNORMAL HIGH (ref 70–99)

## 2022-12-25 MED ORDER — PANTOPRAZOLE SODIUM 40 MG PO TBEC
40.0000 mg | DELAYED_RELEASE_TABLET | Freq: Every day | ORAL | Status: DC
  Administered 2022-12-25 – 2022-12-28 (×4): 40 mg via ORAL
  Filled 2022-12-25 (×4): qty 1

## 2022-12-25 MED ORDER — ENOXAPARIN SODIUM 40 MG/0.4ML IJ SOSY
40.0000 mg | PREFILLED_SYRINGE | INTRAMUSCULAR | Status: DC
  Administered 2022-12-25 – 2022-12-28 (×4): 40 mg via SUBCUTANEOUS
  Filled 2022-12-25 (×4): qty 0.4

## 2022-12-25 MED ORDER — LINACLOTIDE 145 MCG PO CAPS
145.0000 ug | ORAL_CAPSULE | Freq: Every day | ORAL | Status: DC
  Administered 2022-12-26 – 2022-12-28 (×3): 145 ug via ORAL
  Filled 2022-12-25 (×3): qty 1

## 2022-12-25 NOTE — Progress Notes (Signed)
I spoke with Mr. Antonucci, patient's husband and he is concerned about patient's psychiatric state and risk of recurrent attempts to hurting herself. Will forward this information to the psychiatry team.

## 2022-12-25 NOTE — Consult Note (Signed)
[facility] Face-to-Face Psychiatric Evaluation   Service Date: December 25, 2022 LOS:  LOS: 4 days    Assessment  The patient is a 52 year old female with no outpatient psychiatric notes or behavioral health admissions available for review.  She has an outpatient psychiatrist who prescribes her Klonopin and Adderall.  The patient has a past medical history of rectal prolapse, irritable bowel syndrome, and GERD.  She was medically admitted on 4/24 after being found unresponsive.  She was found to have a blood glucose of 22.  Lab work showed high insulin and low C-peptide, consistent with exogenous insulin administration (in a patient who has no history of type I or type 2 diabetes).  Psychiatry was consulted on 4/29 for the patient's husband being concerned suicidal intention.  Based on the patient's reported symptomatology, she does not meet criteria for major depressive disorder.  Feel that the patient is underreporting her symptoms.  Based on her husband's report of symptoms, the patient certainly exhibits hypersomnia and lack of energy and anhedonia.  This is consistent with a diagnosis of major depressive disorder.  The patient also meets criteria for generalized anxiety disorder.  Recommend inpatient psychiatric hospitalization.  The patient reports taking several Xanax not prescribed to her and injecting herself with insulin.  This behavior is consistent with intentions of self-harm, even if this was facilitated by intoxication with Xanax.  Collateral from her husband is decisive, with him reporting severe depression and the patient making many suicidal statements since the patient began having health issues.  The patient is currently amenable to behavioral health hospitalization.  She will need to be placed under involuntary commitment should she try to leave because she is at high risk for completed suicide.  The patient's husband feels that the patient has minimal elopement risk.   Will leave the patient's status as voluntary to avoid a sheriff's office transfer to behavioral health.  Recommend no changes presently to the patient's psychiatric regimen.  This can be further addressed in subsequent days or at the behavioral health Park.   Diagnoses:  Active Park problems: Principal Problem:   Hypoglycemia Active Problems:   UTI (urinary tract infection)   IBS (irritable bowel syndrome)   Generalized anxiety disorder   CKD stage 3a, GFR 45-59 ml/min (HCC)   Chronic anemia   Major depressive disorder, recurrent episode, severe (HCC)     Plan  ## Safety and Observation Level:  - Based on my clinical evaluation, I estimate the patient to be at high risk of self harm in the current setting - At this time, we recommend a one-to-one level of observation. This decision is based on my review of the chart including patient's history and current presentation, interview of the patient, mental status examination, and consideration of suicide risk including evaluating suicidal ideation, plan, intent, suicidal or self-harm behaviors, risk factors, and protective factors. This judgment is based on our ability to directly address suicide risk, implement suicide prevention strategies and develop a safety plan while the patient is in the clinical setting. Please contact our team if there is a concern that risk level has changed.   ## Medications:  -- Continue home Klonopin and doxepin, both scheduled nightly - Continue Prozac 60 mg daily, patient reports this medication was recently added (and Trintellix was discontinued)  ## Medical Decision Making Capacity:  Not assessed on this encounter  ## Further Work-up:  Per primary  ## Disposition:  Inpatient psychiatry  ## Behavioral / Environmental:  1:1  ##  Legal Status VOL  Thank you for this consult request. Recommendations have been communicated to the primary team.  We will follow at this time.   Carlyn Reichert,  MD   NEW history  Relevant Aspects of Park Course:  Admitted on 12/20/2022 for AMS.  Patient Report:  Pertinent history is gathered from the patient as follows. For ease of reading, objective statements that are made in the paragraphs that follow actually reflect subjective report of the patient.  The patient was born and raised in [place].  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  She stopped working 2 years ago because of physical health reasons.  She talks about becoming septic in 2022.  She reports that her husband, Emily Park, does Theme park manager and she says that they have a good relationship.  She denies abusing alcohol or illegal drugs.  The patient mainly identifies her physical ailments as her stressors.  She reports difficulties with her vision, irritable bowel syndrome, as her most problematic symptoms.  She reports that she has a 23 year old child who lives at home with them.  She reports that her parents are nearby and that her husband is supportive.  The patient denies having any access to guns.  The patient admits to some depression related to her physical problems.  She denies experiencing any suicidal thoughts over the past several weeks.  She reports good sleep with her medication regimen.  She reports poor concentration and feelings of guilt.  She reports no anhedonia.  The patient reports worrying about multiple different topics, such as her children and finances.  She reports fatigue, and previously listed insomnia and concentration difficulties.  She denies experiencing panic attacks.  Bipolar affective disorder screen is negative.  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  She insist that she only injected 2 units of insulin several times.  She states she is unsure whether or not  she was trying to kill herself.  ROS:  As above  Collateral information:  [name], spouse, contacted with consent of the patient. [phone] She has threatened to kill herself 50 times over the past several years.  She experiences depression characterized by: lies in bed and only gets up to go to the bathroom. Yesterday she said that it was a suicide attempt.  Guns removed to his car a week or so ago.  She gets "paranoid", thinking that people are talking about her.  No alcohol or drugs but does abuse her prescribed meds and then takes his medications (mainly Xanax). 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   Elopement risk: appears low based on his estimate   Psychiatric History:  Information collected from patient, EMR  Family psych history: none   Social History:  As above   Family History:  The patient's family history includes AAA (abdominal aortic aneurysm) in her father; Breast cancer in her paternal grandmother; Cancer in her mother; Depression in her mother; Diabetes in her maternal grandfather; Heart attack in her father; Hypertension in her father; Hyperthyroidism in her sister; Hypothyroidism in her mother and sister; Thyroid disease in her mother.  Medical History: Past Medical History:  Diagnosis Date   Abnormal TSH    ADD (attention deficit disorder)    Anemia    Anxiety  Arrhythmia    Decreased libido    Depression    Heart murmur    Hyperlipidemia    Hypokalemia    Migraine    Nephrolithiasis    Pap smear abnormality of cervix/human papillomavirus (HPV) positive    Sleep difficulties    SVT (supraventricular tachycardia)     Surgical History: Past Surgical History:  Procedure Laterality Date   ABLATION     Cardiac ablation, endometrial ablation   BREAST LUMPECTOMY     right breast   BREAST LUMPECTOMY Right    CARDIOVERSION     COLONOSCOPY WITH PROPOFOL N/A 05/16/2021   Procedure: COLONOSCOPY WITH PROPOFOL;   Surgeon: Charlott Rakes, MD;  Location: [facility];  Service: Endoscopy;  Laterality: N/A;   COLPOSCOPY     w/ cervical biopsy   DILITATION & CURRETTAGE/HYSTROSCOPY WITH NOVASURE ABLATION N/A 09/11/2014   Procedure: DILATATION & CURETTAGE/HYSTEROSCOPY WITH NOVASURE ABLATION;  Surgeon: Lenoard Aden, MD;  Location: [facility];  Service: Gynecology;  Laterality: N/A;   GIVENS CAPSULE STUDY N/A 05/16/2021   Procedure: GIVENS CAPSULE STUDY;  Surgeon: Charlott Rakes, MD;  Location: [facility];  Service: Endoscopy;  Laterality: N/A;   XI ROBOT ASSISTED RECTOPEXY N/A 07/07/2021   Procedure: XI ROBOT ASSISTED RECTOPEXY;  Surgeon: Karie Soda, MD;  Location: [facility];  Service: General;  Laterality: N/A;   XI ROBOTIC ASSISTED LOWER ANTERIOR RESECTION N/A 07/07/2021   Procedure: XI ROBOTIC ASSISTED LOW ANTERIOR RECTOSIGMOID RESECTION WITH RIGID PROCTOSCOPY, TAP BLOCK;  Surgeon: Karie Soda, MD;  Location: [facility];  Service: General;  Laterality: N/A;    Medications:   Current Facility-Administered Medications:    Chlorhexidine Gluconate Cloth 2 % PADS 6 each, 6 each, Topical, Daily, Byrum, Les Pou, MD, 6 each at 12/25/22 1057   clonazePAM (KLONOPIN) tablet 1 mg, 1 mg, Oral, QHS, Byrum, Les Pou, MD, 1 mg at 12/24/22 2057   diphenhydrAMINE (BENADRYL) capsule 25 mg, 25 mg, Oral, Q6H PRN, Leslye Peer, MD, 25 mg at 12/24/22 1335   docusate sodium (COLACE) capsule 100 mg, 100 mg, Oral, BID PRN, Leslye Peer, MD, 100 mg at 12/23/22 1723   doxepin (SINEQUAN) capsule 75 mg, 75 mg, Oral, QHS, Byrum, Les Pou, MD, 75 mg at 12/24/22 2205   enoxaparin (LOVENOX) injection 40 mg, 40 mg, Subcutaneous, Q24H, Arrien, York Ram, MD, 40 mg at 12/25/22 1615   FLUoxetine (PROZAC) capsule 60 mg, 60 mg, Oral, Daily, Byrum, Les Pou, MD, 60 mg at 12/25/22 1059   hydrocortisone sodium succinate (SOLU-CORTEF) 100 MG injection 50 mg, 50 mg, Intravenous, Q8H, Pokhrel, Laxman, MD, 50 mg at 12/25/22 1400    [START ON 12/26/2022] linaclotide (LINZESS) capsule 145 mcg, 145 mcg, Oral, QAC breakfast, Arrien, York Ram, MD   ondansetron [facility]) injection 4 mg, 4 mg, Intravenous, Q8H PRN, Leslye Peer, MD, 4 mg at 12/22/22 0947   Oral care mouth rinse, 15 mL, Mouth Rinse, PRN, Pokhrel, Laxman, MD   pantoprazole (PROTONIX) EC tablet 40 mg, 40 mg, Oral, Daily, Arrien, York Ram, MD, 40 mg at 12/25/22 1213   polyethylene glycol (MIRALAX / GLYCOLAX) packet 17 g, 17 g, Oral, Daily PRN, Leslye Peer, MD, 17 g at 12/23/22 1723   sodium chloride flush (NS) 0.9 % injection 10-40 mL, 10-40 mL, Intracatheter, Q12H, Byrum, Les Pou, MD, 10 mL at 12/25/22 1059   sodium chloride flush (NS) 0.9 % injection 10-40 mL, 10-40 mL, Intracatheter, PRN, Leslye Peer, MD  Allergies: Allergies  Allergen Reactions  Zithromax [Azithromycin] Rash       Objective  Vital signs:  Temp:  [97.5 F (36.4 C)-98.3 F (36.8 C)] 98.3 F (36.8 C) (04/29 1622) Pulse Rate:  [72-110] 95 (04/29 1622) Resp:  [17-18] 18 (04/29 1622) BP: (118-140)/(83-96) 124/83 (04/29 1622) SpO2:  [97 %-100 %] 97 % (04/29 1622) Weight:  [56.6 kg] 56.6 kg (04/29 0512)  Psychiatric Specialty Exam: Physical Exam Constitutional:      Appearance: the patient is not toxic-appearing.  Pulmonary:     Effort: Pulmonary effort is normal.  Neurological:     General: No focal deficit present.     Mental Status: the patient is alert and oriented to person, place, and time.   Review of Systems  Respiratory:  Negative for shortness of breath.   Cardiovascular:  Negative for chest pain.  Gastrointestinal:  Negative for abdominal pain, constipation, diarrhea, nausea and vomiting.  Neurological:  Negative for headaches.      BP 124/83 (BP Location: Left Arm)   Pulse 95   Temp 98.3 F (36.8 C) (Oral)   Resp 18   Ht 5' 4.75" (1.645 m)   Wt 56.6 kg   SpO2 97%   BMI 20.93 kg/m   General Appearance: Fairly Groomed  Eye Contact:   Good  Speech:  Clear and Coherent  Volume:  Normal  Mood:  Euthymic  Affect:  Congruent (superficially bright?)  Thought Process:  Coherent  Orientation:  Full (Time, Place, and Person)  Thought Content: Logical   Suicidal Thoughts:  No  Homicidal Thoughts:  No  Memory:  Immediate;   Good  Judgement:  poor  Insight:  shallow  Psychomotor Activity:  Normal  Concentration:  Concentration: Good  Recall:  Good  Fund of Knowledge: Good  Language: Good  Akathisia:  No  Handed:    AIMS (if indicated): not done  Assets:  Communication Skills Desire for Improvement Financial Resources/Insurance Housing Leisure Time Physical Health  ADL's:  Intact  Cognition: WNL  Sleep:  Fair   Carlyn Reichert, MD PGY-2

## 2022-12-25 NOTE — Plan of Care (Signed)

## 2022-12-25 NOTE — Assessment & Plan Note (Addendum)
Urine cultures with no growth.  -Leukocytosis trending down currently at 11.3.  -Status post completion of full course of IV Rocephin.  -Patient afebrile.  -Repeat labs in the morning.

## 2022-12-25 NOTE — Progress Notes (Signed)
Consult received d/t noted bruising to the R arm with PICC line. Dark bruising noted at insertion site and back of arm. Outlined bruising with skin pin. No redness, heat or swelling or other concerns at this time. Patient denies pain or tenderness. MD is aware. Tomasita Morrow, RN VAST

## 2022-12-25 NOTE — Hospital Course (Signed)
[facility] was admitted to the hospital with the working diagnosis of hypoglycemia.   52 yo female with the past medical history of GERD, IBS, and anxiety who presented with altered mental status. Patient was found unresponsive on the floor by her husband. EMS was called and she was found hypotensive 70/48, with glucose 22, and having a possible seizure. She was given IV fluids, started on IV epinephrine and received one amp D50 IV, and transported to the ED. On her physical examination her blood pressure was 90/57, HR 76, RR 20 and 02 saturation 100%, patient was awake and alert, lungs with no wheezing or rales, heart with S1 and S2 present and rhythmic, abdomen with no distention, left thigh with levido reticularis, no  lower extremity edema.   Na 133, K ,<2.0 Cl 105 bicarbonate 19 glucose 96 bun 13 cr 1,29 Mg 1,7  AST 26, ALT 13, total bilirubin 0.4  Wbc 14,5 hgb 10.0 plt 259   C peptide 0,4, insulin 48.8   Urine analysis SG 1,003, protein negative, leukocytes negative, many bacteria.   Toxicology positive for benzodiazepines.  Alcohol <10.   Chest radiograph with no cardiomegaly with no infiltrates.   EKG 99 bpm, normal axis, qtc 519, sinus rhythm with no significant ST segment or T wave changes.   Despite having normal BG transiently, she dropped back into the 20s again so pulmonary critical care was consulted and patient was admitted to ICU   Patient was placed on: d10 and then on D20, steroids, Virgil octreotide, empiric antibiotics   04/28 transferred to [facility].

## 2022-12-25 NOTE — Social Work (Signed)
  Transition of Care [facility]) Screening Note   Patient Details  Name: SEERAT PEADEN Date of Birth: 06-Dec-1970   Transition of Care [facility]) CM/SW Contact:    Carley Hammed, LCSW Phone Number: 12/25/2022, 2:31 PM    Transition of Care Department [facility]) has reviewed patient and we will continue to monitor patient advancement through interdisciplinary progression rounds. Will follow for final Psych recommendations and SDOH needs. If additional patient transition needs arise, please place a TOC consult.

## 2022-12-25 NOTE — Assessment & Plan Note (Signed)
Continue with pantoprazole and linaclotide.

## 2022-12-25 NOTE — Assessment & Plan Note (Signed)
Renal function has been stable with a serum cr at 1,0.  Plan to check electrolytes and fasting glucose in am.

## 2022-12-25 NOTE — Assessment & Plan Note (Addendum)
Patient has been evaluated by psychiatry, patient not suicidal risk early on when assessed by psychiatry.   -Patient reassessed by psychiatry who are now recommending inpatient psychiatric admission once medically stable.  -Continue fluoxetine 40 mg daily, Klonopin 1 mg nightly, doxepin 75 mg nightly. -Continue one-to-one sitter. -Psychiatry following and appreciate input and recommendations.

## 2022-12-25 NOTE — Assessment & Plan Note (Signed)
Cell count has been stable, with Hgb at 10.3

## 2022-12-25 NOTE — Progress Notes (Addendum)
Progress Note   Patient: Emily Park:[id number] DOB: February 06, 1971 DOA: 12/20/2022     4 DOS: the patient was seen and examined on 12/25/2022   Brief hospital course: Emily Park was admitted to the hospital with the working diagnosis of hypoglycemia.   52 yo female with the past medical history of GERD, IBS, and anxiety who presented with altered mental status. Patient was found unresponsive on the floor by her husband. EMS was called and she was found hypotensive 70/48, with glucose 22, and having a possible seizure. She was given IV fluids, started on IV epinephrine and received one amp D50 IV, and transported to the ED. On her physical examination her blood pressure was 90/57, HR 76, RR 20 and 02 saturation 100%, patient was awake and alert, lungs with no wheezing or rales, heart with S1 and S2 present and rhythmic, abdomen with no distention, left thigh with levido reticularis, no  lower extremity edema.   Na 133, K ,<2.0 Cl 105 bicarbonate 19 glucose 96 bun 13 cr 1,29 Mg 1,7  AST 26, ALT 13, total bilirubin 0.4  Wbc 14,5 hgb 10.0 plt 259   C peptide 0,4, insulin 48.8   Urine analysis SG 1,003, protein negative, leukocytes negative, many bacteria.   Toxicology positive for benzodiazepines.  Alcohol <10.   Chest radiograph with no cardiomegaly with no infiltrates.   EKG 99 bpm, normal axis, qtc 519, sinus rhythm with no significant ST segment or T wave changes.   Despite having normal BG transiently, she dropped back into the 20s again so pulmonary critical care was consulted and patient was admitted to ICU   Patient was placed on: d10 and then on D20, steroids, Poneto octreotide, empiric antibiotics   04/28 transferred to [facility].   Assessment and Plan: * Hypoglycemia Patient has been on octreotide, IV corticosteroids and IV dextrose infusion.  C peptide low and insulin high, consistent with possible insulin toxicity.   Her po intake has been improving.   Octreotide has been  discontinued.  Plan to discontinue IV dextrose. Continue to taper systemic corticosteroids.  Follow up CBG tid before meals. Follow up fasting glucose in am.   Generalized anxiety disorder Patient has been evaluated by psychiatry, patient not suicidal risk at this point in time.   Plan to continue with fluoxetine 20 mg  Patient is on doxepin and clonazepam.  Will discontinue one to one sitter and suicidal precautions.  Continue neuro checks per unit protocol.   CKD stage 3a, GFR 45-59 ml/min (HCC) Renal function has been stable with a serum cr at 1,0.  Plan to check electrolytes and fasting glucose in am.   Chronic anemia Cell count has been stable, with Hgb at 10.3    UTI (urinary tract infection) Urine culture has been with no growth.  Wbc has been at 14. Patient has completed therapy with ceftriaxone.  Plan to follow up cell count in am.    IBS (irritable bowel syndrome) Continue with pantoprazole and linaclotide.         Subjective: Patient is feeling better, no chest pain or dyspnea, her po intake has been increasing, denies any thoughts of hurting self or others.   Physical Exam: Vitals:   12/24/22 1653 12/24/22 1946 12/25/22 0512 12/25/22 0718  BP: (!) 134/97 120/85 (!) 140/96 118/88  Pulse: 84 (!) 110 72 98  Resp: 19 18 18 17   Temp: 98.5 F (36.9 C) 98.2 F (36.8 C) (!) 97.5 F (36.4 C) 98 F (36.7  C)  TempSrc: Oral Oral Oral Oral  SpO2: 98% 100% 100% 100%  Weight:   56.6 kg   Height:       Neurology awake and alert ENT with mild pallor Cardiovascular with S1 and S2 present and rhythmic with no gallops, ,rubs or murmurs Respiratory with no wheezing or rales Abdomen with no distention  Data Reviewed:    Family Communication: no family at the bedside   Disposition: Status is: Inpatient Remains inpatient appropriate because: hypoglycemia   Planned Discharge Destination: Home   Author: Coralie Keens, MD 12/25/2022 1:30 PM  For on  call review [email or web address].

## 2022-12-25 NOTE — Assessment & Plan Note (Signed)
Patient has been on octreotide, IV corticosteroids and IV dextrose infusion.  C peptide low and insulin high, consistent with possible insulin toxicity.  -Patient with improving oral intake. -Octreotide discontinued. -IV dextrose discontinued. -CBG noted at 101 today. -Patient encouraged for oral intake. -Decrease IV Solu-Cortef to 50 mg every 12 hours. -Continue steroid taper. -Supportive care.

## 2022-12-26 DIAGNOSIS — F332 Major depressive disorder, recurrent severe without psychotic features: Secondary | ICD-10-CM

## 2022-12-26 DIAGNOSIS — E876 Hypokalemia: Secondary | ICD-10-CM | POA: Diagnosis not present

## 2022-12-26 DIAGNOSIS — N1831 Chronic kidney disease, stage 3a: Secondary | ICD-10-CM | POA: Diagnosis not present

## 2022-12-26 DIAGNOSIS — F411 Generalized anxiety disorder: Secondary | ICD-10-CM | POA: Diagnosis not present

## 2022-12-26 DIAGNOSIS — E162 Hypoglycemia, unspecified: Secondary | ICD-10-CM | POA: Diagnosis not present

## 2022-12-26 DIAGNOSIS — N3000 Acute cystitis without hematuria: Secondary | ICD-10-CM

## 2022-12-26 LAB — CBC
HCT: 33.7 % — ABNORMAL LOW (ref 36.0–46.0)
Hemoglobin: 11.2 g/dL — ABNORMAL LOW (ref 12.0–15.0)
MCH: 30.8 pg (ref 26.0–34.0)
MCHC: 33.2 g/dL (ref 30.0–36.0)
MCV: 92.6 fL (ref 80.0–100.0)
Platelets: 255 10*3/uL (ref 150–400)
RBC: 3.64 MIL/uL — ABNORMAL LOW (ref 3.87–5.11)
RDW: 13.3 % (ref 11.5–15.5)
WBC: 11.3 10*3/uL — ABNORMAL HIGH (ref 4.0–10.5)
nRBC: 0 % (ref 0.0–0.2)

## 2022-12-26 LAB — BASIC METABOLIC PANEL
Anion gap: 9 (ref 5–15)
Anion gap: 12 (ref 5–15)
BUN: 15 mg/dL (ref 6–20)
BUN: 19 mg/dL (ref 6–20)
CO2: 32 mmol/L (ref 22–32)
CO2: 27 mmol/L (ref 22–32)
Calcium: 7.9 mg/dL — ABNORMAL LOW (ref 8.9–10.3)
Calcium: 8 mg/dL — ABNORMAL LOW (ref 8.9–10.3)
Chloride: 91 mmol/L — ABNORMAL LOW (ref 98–111)
Chloride: 99 mmol/L (ref 98–111)
Creatinine, Ser: 0.99 mg/dL (ref 0.44–1.00)
Creatinine, Ser: 0.97 mg/dL (ref 0.44–1.00)
GFR, Estimated: >60 mL/min (ref >60)
GFR, Estimated: >60 mL/min (ref >60)
Glucose, Bld: 107 mg/dL — ABNORMAL HIGH (ref 70–99)
Glucose, Bld: 110 mg/dL — ABNORMAL HIGH (ref 70–99)
Potassium: 2.7 mmol/L — CL (ref 3.5–5.1)
Potassium: 3.8 mmol/L (ref 3.5–5.1)
Sodium: 132 mmol/L — ABNORMAL LOW (ref 135–145)
Sodium: 138 mmol/L (ref 135–145)

## 2022-12-26 LAB — GLUCOSE, CAPILLARY
Glucose-Capillary: 101 mg/dL — ABNORMAL HIGH (ref 70–99)
Glucose-Capillary: 112 mg/dL — ABNORMAL HIGH (ref 70–99)
Glucose-Capillary: 114 mg/dL — ABNORMAL HIGH (ref 70–99)

## 2022-12-26 LAB — CULTURE, BLOOD (ROUTINE X 2)
Culture: NO GROWTH
Culture: NO GROWTH
Special Requests: ADEQUATE

## 2022-12-26 LAB — MAGNESIUM: Magnesium: 2 mg/dL (ref 1.7–2.4)

## 2022-12-26 MED ORDER — FLUOXETINE HCL 20 MG PO CAPS
40.0000 mg | ORAL_CAPSULE | Freq: Every day | ORAL | Status: DC
  Administered 2022-12-27 – 2022-12-28 (×2): 40 mg via ORAL
  Filled 2022-12-26 (×2): qty 2

## 2022-12-26 MED ORDER — POTASSIUM CHLORIDE 20 MEQ PO PACK
80.0000 meq | PACK | Freq: Once | ORAL | Status: AC
  Administered 2022-12-26: 80 meq via ORAL
  Filled 2022-12-26: qty 4

## 2022-12-26 MED ORDER — HYDROCORTISONE SOD SUC (PF) 100 MG IJ SOLR
50.0000 mg | Freq: Two times a day (BID) | INTRAMUSCULAR | Status: DC
  Administered 2022-12-27: 50 mg via INTRAVENOUS
  Filled 2022-12-26 (×2): qty 1

## 2022-12-26 NOTE — Consult Note (Signed)
[facility] Psychiatry Face-to-Face Psychiatric Evaluation   Service Date: December 26, 2022 LOS:  LOS: 5 days    Assessment  The patient is a 52 year old female with no outpatient psychiatric notes or behavioral health admissions available for review.  She has an outpatient psychiatrist who prescribes her Klonopin and Adderall.  The patient has a past medical history of rectal prolapse, irritable bowel syndrome, and GERD.  She was medically admitted on 4/24 after being found unresponsive.  She was found to have a blood glucose of 22.  Lab work showed high insulin and low C-peptide, consistent with exogenous insulin administration (in a patient who has no history of type I or type 2 diabetes).  Psychiatry was consulted on 4/29 for the patient's husband being concerned suicidal intention.  Based on the patient's reported symptomatology, she does not meet criteria for major depressive disorder.  Feel that the patient is underreporting her symptoms.  Based on her husband's report of symptoms, the patient certainly exhibits hypersomnia and lack of energy and anhedonia.  This is consistent with a diagnosis of major depressive disorder.  The patient also meets criteria for generalized anxiety disorder.  Recommend inpatient psychiatric hospitalization.  The patient reports taking several Xanax not prescribed to her and injecting herself with insulin.  This behavior is consistent with intentions of self-harm, even if this was facilitated by intoxication with Xanax.  Collateral from her husband is decisive, with him reporting severe depression and the patient making many suicidal statements since the patient began having health issues.  The patient is currently amenable to behavioral health hospitalization.  She will need to be placed under involuntary commitment should she try to leave because she is at high risk for completed suicide.  The patient's husband feels that the patient has minimal elopement risk.   Will leave the patient's status as voluntary to avoid a sheriff's office transfer to behavioral health.  4/30: Patient remains amenable to [facility] hospitalization.  Potassium low at 2.7.  Will wait for the patient to be medically ready for discharge.  Planning to decrease the patient's home Prozac from 60 mg daily to 40 mg daily, in anticipation of transition to the [facility] behavioral hospital.   Diagnoses:  Active Hospital problems: Principal Problem:   Hypoglycemia Active Problems:   UTI (urinary tract infection)   IBS (irritable bowel syndrome)   Generalized anxiety disorder   CKD stage 3a, GFR 45-59 ml/min (HCC)   Chronic anemia   Major depressive disorder, recurrent episode, severe (HCC)     Plan  ## Safety and Observation Level:  - Based on my clinical evaluation, I estimate the patient to be at high risk of self harm in the current setting - At this time, we recommend a one-to-one level of observation. This decision is based on my review of the chart including patient's history and current presentation, interview of the patient, mental status examination, and consideration of suicide risk including evaluating suicidal ideation, plan, intent, suicidal or self-harm behaviors, risk factors, and protective factors. This judgment is based on our ability to directly address suicide risk, implement suicide prevention strategies and develop a safety plan while the patient is in the clinical setting. Please contact our team if there is a concern that risk level has changed.   ## Medications:  -- Continue home Klonopin and doxepin, both scheduled nightly - Decrease Prozac from home dose of 60 mg daily to 40 mg daily, in anticipation of medication change at the behavioral hospital.  ##  Medical Decision Making Capacity:  Not assessed on this encounter  ## Further Work-up:  Per primary  ## Disposition:  Inpatient psychiatry  ## Behavioral / Environmental:  1:1  ##Legal  Status VOL  Thank you for this consult request. Recommendations have been communicated to the primary team.  We will follow at this time.   Carlyn Reichert, MD   NEW history  Relevant Aspects of Hospital Course:  Admitted on 12/20/2022 for AMS.  Patient Report:  Pertinent history is gathered from the patient as follows. For ease of reading, objective statements that are made in the paragraphs that follow actually reflect subjective report of the patient.  The patient was born and raised in [place].  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  She stopped working 2 years ago because of physical health reasons.  She talks about becoming septic in 2022.  She reports that her husband, [name], does Theme park manager and she says that they have a good relationship.  She denies abusing alcohol or illegal drugs.  The patient mainly identifies her physical ailments as her stressors.  She reports difficulties with her vision, irritable bowel syndrome, as her most problematic symptoms.  She reports that she has a 5 year old child who lives at home with them.  She reports that her parents are nearby and that her husband is supportive.  The patient denies having any access to guns.  The patient admits to some depression related to her physical problems.  She denies experiencing any suicidal thoughts over the past several weeks.  She reports good sleep with her medication regimen.  She reports poor concentration and feelings of guilt.  She reports no anhedonia.  The patient reports worrying about multiple different topics, such as her children and finances.  She reports fatigue, and previously listed insomnia and concentration difficulties.  She denies experiencing panic attacks.  Bipolar affective disorder screen is negative.  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  She insist that she only injected 2 units of insulin several times.  She states she is unsure whether or not she was trying to kill herself.  On interview 4/30: The patient exhibits a linear and logical thought process. The patient denies auditory/visual hallucinations and first rank symptoms.  She reports good mood, appetite, and sleep. She denies suicidal and homicidal thoughts. She denies side effects from her medications.  Review of systems as below.  Discussed the need for the patient to develop emotional regulation skills.  The patient agrees.  She continues to state that she is amenable to hospitalization at the behavioral hospital.  Attempted insight building regarding the patient's suicide attempt.   ROS:  As above  Collateral information:  Velta Rockholt, spouse, contacted with consent of the patient. [phone] She has threatened to kill herself 50 times over the past several years.  She experiences depression characterized by: lies in bed and only gets up to go to the bathroom. Yesterday she said that it was a suicide attempt.  Guns removed to his car a week or so ago.  She gets "paranoid", thinking that people are talking about her.  No alcohol or drugs but does abuse her prescribed meds and then takes his medications (mainly Xanax). 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  Elopement risk: appears low based on his estimate   Psychiatric History:  Information collected from patient, EMR  Family psych history: none   Social History:  As above   Family History:  The patient's family history includes AAA (abdominal aortic aneurysm) in her father; Breast cancer in her paternal grandmother; Cancer in her mother; Depression in her mother; Diabetes in her maternal grandfather; Heart attack in her father; Hypertension in her father; Hyperthyroidism in her sister; Hypothyroidism in her mother and sister;  Thyroid disease in her mother.  Medical History: Past Medical History:  Diagnosis Date   Abnormal TSH    ADD (attention deficit disorder)    Anemia    Anxiety    Arrhythmia    Decreased libido    Depression    Heart murmur    Hyperlipidemia    Hypokalemia    Migraine    Nephrolithiasis    Pap smear abnormality of cervix/human papillomavirus (HPV) positive    Sleep difficulties    SVT (supraventricular tachycardia)     Surgical History: Past Surgical History:  Procedure Laterality Date   ABLATION     Cardiac ablation, endometrial ablation   BREAST LUMPECTOMY     right breast   BREAST LUMPECTOMY Right    CARDIOVERSION     COLONOSCOPY WITH PROPOFOL N/A 05/16/2021   Procedure: COLONOSCOPY WITH PROPOFOL;  Surgeon: Charlott Rakes, MD;  Location: [facility];  Service: Endoscopy;  Laterality: N/A;   COLPOSCOPY     w/ cervical biopsy   DILITATION & CURRETTAGE/HYSTROSCOPY WITH NOVASURE ABLATION N/A 09/11/2014   Procedure: DILATATION & CURETTAGE/HYSTEROSCOPY WITH NOVASURE ABLATION;  Surgeon: Lenoard Aden, MD;  Location: [facility];  Service: Gynecology;  Laterality: N/A;   GIVENS CAPSULE STUDY N/A 05/16/2021   Procedure: GIVENS CAPSULE STUDY;  Surgeon: Charlott Rakes, MD;  Location: [facility];  Service: Endoscopy;  Laterality: N/A;   XI ROBOT ASSISTED RECTOPEXY N/A 07/07/2021   Procedure: XI ROBOT ASSISTED RECTOPEXY;  Surgeon: Karie Soda, MD;  Location: [facility];  Service: General;  Laterality: N/A;   XI ROBOTIC ASSISTED LOWER ANTERIOR RESECTION N/A 07/07/2021   Procedure: XI ROBOTIC ASSISTED LOW ANTERIOR RECTOSIGMOID RESECTION WITH RIGID PROCTOSCOPY, TAP BLOCK;  Surgeon: Karie Soda, MD;  Location: [facility];  Service: General;  Laterality: N/A;    Medications:   Current Facility-Administered Medications:    Chlorhexidine Gluconate Cloth 2 % PADS 6 each, 6 each, Topical, Daily, Byrum, Les Pou, MD, 6 each at 12/25/22 1057   clonazePAM (KLONOPIN) tablet 1 mg, 1 mg, Oral,  QHS, Byrum, Les Pou, MD, 1 mg at 12/25/22 2155   diphenhydrAMINE (BENADRYL) capsule 25 mg, 25 mg, Oral, Q6H PRN, Leslye Peer, MD, 25 mg at 12/24/22 1335   docusate sodium (COLACE) capsule 100 mg, 100 mg, Oral, BID PRN, Leslye Peer, MD, 100 mg at 12/23/22 1723   doxepin (SINEQUAN) capsule 75 mg, 75 mg, Oral, QHS, Byrum, Les Pou, MD, 75 mg at 12/25/22 2154   enoxaparin (LOVENOX) injection 40 mg, 40 mg, Subcutaneous, Q24H, Arrien, York Ram, MD, 40 mg at 12/25/22 1615   FLUoxetine (PROZAC) capsule 60 mg, 60 mg, Oral, Daily, Byrum, Les Pou, MD, 60 mg at 12/26/22 0849   hydrocortisone sodium succinate (SOLU-CORTEF) 100 MG injection 50 mg, 50 mg, Intravenous, Q8H, Pokhrel, Laxman, MD, 50 mg at 12/26/22 0452   linaclotide (LINZESS) capsule 145 mcg, 145 mcg, Oral, QAC breakfast, Arrien, York Ram, MD, 145 mcg at 12/26/22 0849   ondansetron (ZOFRAN) injection 4 mg, 4  mg, Intravenous, Q8H PRN, Leslye Peer, MD, 4 mg at 12/22/22 0947   Oral care mouth rinse, 15 mL, Mouth Rinse, PRN, Pokhrel, Laxman, MD   pantoprazole (PROTONIX) EC tablet 40 mg, 40 mg, Oral, Daily, Arrien, York Ram, MD, 40 mg at 12/26/22 0849   polyethylene glycol (MIRALAX / GLYCOLAX) packet 17 g, 17 g, Oral, Daily PRN, Leslye Peer, MD, 17 g at 12/23/22 1723   sodium chloride flush (NS) 0.9 % injection 10-40 mL, 10-40 mL, Intracatheter, Q12H, Byrum, Les Pou, MD, 10 mL at 12/26/22 0850   sodium chloride flush (NS) 0.9 % injection 10-40 mL, 10-40 mL, Intracatheter, PRN, Leslye Peer, MD  Allergies: Allergies  Allergen Reactions   Zithromax [Azithromycin] Rash       Objective  Vital signs:  Temp:  [98.3 F (36.8 C)-98.7 F (37.1 C)] 98.7 F (37.1 C) (04/30 0736) Pulse Rate:  [94-95] 94 (04/30 0736) Resp:  [18] 18 (04/30 0736) BP: (106-124)/(83-86) 106/86 (04/30 0736) SpO2:  [97 %-98 %] 98 % (04/30 0736)  Psychiatric Specialty Exam: Physical Exam Constitutional:      Appearance: the  patient is not toxic-appearing.  Pulmonary:     Effort: Pulmonary effort is normal.  Neurological:     General: No focal deficit present.     Mental Status: the patient is alert and oriented to person, place, and time.   Review of Systems  Respiratory:  Negative for shortness of breath.   Cardiovascular:  Negative for chest pain.  Gastrointestinal:  Negative for abdominal pain, constipation, diarrhea, nausea and vomiting.  Neurological:  Negative for headaches.      BP 106/86 (BP Location: Left Arm)   Pulse 94   Temp 98.7 F (37.1 C) (Oral)   Resp 18   Ht 5' 4.75" (1.645 m)   Wt 56.6 kg   SpO2 98%   BMI 20.93 kg/m   General Appearance: Fairly Groomed  Eye Contact:  Good  Speech:  Clear and Coherent  Volume:  Normal  Mood:  Euthymic  Affect:  incongruent - bright, anxious, occasionally tearful  Thought Process:  Coherent  Orientation:  Full (Time, Place, and Person)  Thought Content: Logical   Suicidal Thoughts:  No  Homicidal Thoughts:  No  Memory:  Immediate;   Good  Judgement:  poor  Insight:  shallow  Psychomotor Activity:  Normal  Concentration:  Concentration: Good  Recall:  Good  Fund of Knowledge: Good  Language: Good  Akathisia:  No  Handed:    AIMS (if indicated): not done  Assets:  Communication Skills Desire for Improvement Financial Resources/Insurance Housing Leisure Time Physical Health  ADL's:  Intact  Cognition: WNL  Sleep:  Fair   Carlyn Reichert, MD PGY-2

## 2022-12-26 NOTE — Assessment & Plan Note (Signed)
-  Potassium at 2.7 this morning. -Oral potassium 80 mEq ordered this morning. -Repeat labs this afternoon.

## 2022-12-26 NOTE — Assessment & Plan Note (Signed)
-  Patient noted with suicidal ideation. -Initially seen by psychiatry and patient cleared however husband concerned of patient's suicidality and as such patient reassessed by psychiatry who have reversed a prior recommendation and recommending inpatient psychiatric admission once patient is medically stable. -Continue current recommendations of Prozac 40 mg daily, Klonopin, doxepin as recommended per psychiatry. -Per psychiatry.

## 2022-12-26 NOTE — Progress Notes (Signed)
Pt A&OX4. Denies S/I at this time. Suicide precautions maintained. 1:1 sitter in room within arms reach. No needs voiced at this time.  Reva Bores 12/26/22 5:00 PM

## 2022-12-26 NOTE — Progress Notes (Signed)
PROGRESS NOTE    Emily Park  ZOX:[id number] DOB: 1970/09/27 DOA: 12/20/2022 PCP: Tresa Garter, MD    Chief Complaint  Patient presents with   Loss of Consciousness    Brief Narrative:  Emily Park was admitted to the hospital with the working diagnosis of hypoglycemia.   52 yo female with the past medical history of GERD, IBS, and anxiety who presented with altered mental status. Patient was found unresponsive on the floor by her husband. EMS was called and she was found hypotensive 70/48, with glucose 22, and having a possible seizure. She was given IV fluids, started on IV epinephrine and received one amp D50 IV, and transported to the ED. On her physical examination her blood pressure was 90/57, HR 76, RR 20 and 02 saturation 100%, patient was awake and alert, lungs with no wheezing or rales, heart with S1 and S2 present and rhythmic, abdomen with no distention, left thigh with levido reticularis, no  lower extremity edema.   Na 133, K ,<2.0 Cl 105 bicarbonate 19 glucose 96 bun 13 cr 1,29 Mg 1,7  AST 26, ALT 13, total bilirubin 0.4  Wbc 14,5 hgb 10.0 plt 259   C peptide 0,4, insulin 48.8   Urine analysis SG 1,003, protein negative, leukocytes negative, many bacteria.   Toxicology positive for benzodiazepines.  Alcohol <10.   Chest radiograph with no cardiomegaly with no infiltrates.   EKG 99 bpm, normal axis, qtc 519, sinus rhythm with no significant ST segment or T wave changes.   Despite having normal BG transiently, she dropped back into the 20s again so pulmonary critical care was consulted and patient was admitted to ICU   Patient was placed on: d10 and then on D20, steroids, [place] octreotide, empiric antibiotics   04/28 transferred to [facility].     Assessment & Plan:  Principal Problem:   Hypoglycemia Active Problems:   Generalized anxiety disorder   CKD stage 3a, GFR 45-59 ml/min (HCC)   Chronic anemia   UTI (urinary tract infection)   IBS (irritable  bowel syndrome)   Hypokalemia   Major depressive disorder, recurrent episode, severe (HCC)    Assessment and Plan: * Hypoglycemia Patient has been on octreotide, IV corticosteroids and IV dextrose infusion.  C peptide low and insulin high, consistent with possible insulin toxicity.  -Patient with improving oral intake. -Octreotide discontinued. -IV dextrose discontinued. -CBG noted at 101 today. -Patient encouraged for oral intake. -Decrease IV Solu-Cortef to 50 mg every 12 hours. -Continue steroid taper. -Supportive care.  Generalized anxiety disorder Patient has been evaluated by psychiatry, patient not suicidal risk early on when assessed by psychiatry.   -Patient reassessed by psychiatry who are now recommending inpatient psychiatric admission once medically stable.  -Continue fluoxetine 40 mg daily, Klonopin 1 mg nightly, doxepin 75 mg nightly. -Continue one-to-one sitter. -Psychiatry following and appreciate input and recommendations.  CKD stage 3a, GFR 45-59 ml/min (HCC) - Renal function stable.   -Creatinine at 0.99.   -Repeat labs in the AM.    Chronic anemia - Hemoglobin stable at 11.2.   -No overt bleeding.     UTI (urinary tract infection) Urine cultures with no growth.  -Leukocytosis trending down currently at 11.3.  -Status post completion of full course of IV Rocephin.  -Patient afebrile.  -Repeat labs in the morning.   IBS (irritable bowel syndrome) Continue with pantoprazole and linaclotide.   Major depressive disorder, recurrent episode, severe (HCC) -Patient noted with suicidal ideation. -Initially seen by psychiatry  and patient cleared however husband concerned of patient's suicidality and as such patient reassessed by psychiatry who have reversed a prior recommendation and recommending inpatient psychiatric admission once patient is medically stable. -Continue current recommendations of Prozac 40 mg daily, Klonopin, doxepin as recommended per  psychiatry. -Per psychiatry.  Hypokalemia -Potassium at 2.7 this morning. -Oral potassium 80 mEq ordered this morning. -Repeat labs this afternoon.         DVT prophylaxis: Lovenox Code Status: Full Family Communication: Updated patient.  No family at bedside. Disposition: To inpatient psychiatry when clinically stable, off steroids with CBG stabilizing hopefully in the next 48 hours.  Status is: Inpatient Remains inpatient appropriate because: Severity of illness   Consultants:  Psychiatry: Dr. Jannifer [place] 12/24/2022 Psychiatry: Dr. Gasper Sells 12/25/2022  Procedures:  Chest x-ray 12/20/2022   Antimicrobials:  Anti-infectives (From admission, onward)    Start     Dose/Rate Route Frequency Ordered Stop   12/22/22 0000  cefTRIAXone (ROCEPHIN) 2 g in sodium chloride 0.9 % 100 mL IVPB  Status:  Discontinued        2 g 200 mL/hr over 30 Minutes Intravenous Every 24 hours 12/21/22 0155 12/24/22 1225   12/21/22 0800  cefTRIAXone (ROCEPHIN) 2 g in sodium chloride 0.9 % 100 mL IVPB  Status:  Discontinued        2 g 200 mL/hr over 30 Minutes Intravenous Every 24 hours 12/21/22 0114 12/21/22 0116   12/21/22 0200  cefTRIAXone (ROCEPHIN) 2 g in sodium chloride 0.9 % 100 mL IVPB  Status:  Discontinued        2 g 200 mL/hr over 30 Minutes Intravenous Daily 12/21/22 0116 12/21/22 0147   12/21/22 0200  cefTRIAXone (ROCEPHIN) 2 g in sodium chloride 0.9 % 100 mL IVPB        2 g 200 mL/hr over 30 Minutes Intravenous  Once 12/21/22 0155 12/21/22 0249   12/21/22 0100  vancomycin (VANCOREADY) IVPB 1250 mg/250 mL  Status:  Discontinued        1,250 mg 166.7 mL/hr over 90 Minutes Intravenous  Once 12/21/22 0059 12/21/22 0114   12/21/22 0100  ceFEPIme (MAXIPIME) 2 g in sodium chloride 0.9 % 100 mL IVPB  Status:  Discontinued        2 g 200 mL/hr over 30 Minutes Intravenous  Once 12/21/22 0059 12/21/22 0114         Subjective: Laying in bed eating a salad.  States she is feeling better every  day.  Denies any chest pain.  No shortness of breath.  No abdominal pain.  Denies any suicidal or homicidal ideation.  States ate about half her omelette this morning with some potatoes.  Sitter at bedside.  States just saw psychiatry.  Objective: Vitals:   12/25/22 0718 12/25/22 1622 12/26/22 0736 12/26/22 1630  BP: 118/88 124/83 106/86 125/76  Pulse: 98 95 94 85  Resp: 17 18 18 20   Temp: 98 F (36.7 C) 98.3 F (36.8 C) 98.7 F (37.1 C) 98.7 F (37.1 C)  TempSrc: Oral Oral Oral Oral  SpO2: 100% 97% 98% 100%  Weight:      Height:        Intake/Output Summary (Last 24 hours) at 12/26/2022 1652 Last data filed at 12/26/2022 1200 Gross per 24 hour  Intake 720 ml  Output --  Net 720 ml   Filed Weights   12/23/22 0402 12/24/22 0408 12/25/22 0512  Weight: 63.9 kg 63 kg 56.6 kg    Examination:  General exam: Appears  calm and comfortable  Respiratory system: Clear to auscultation. Respiratory effort normal. Cardiovascular system: S1 & S2 heard, RRR. No JVD, murmurs, rubs, gallops or clicks. No pedal edema. Gastrointestinal system: Abdomen is nondistended, soft and nontender. No organomegaly or masses felt. Normal bowel sounds heard. Central nervous system: Alert and oriented. No focal neurological deficits. Extremities: Symmetric 5 x 5 power. Skin: Some bruising noted on right upper extremity.   Psychiatry: Judgement and insight appear normal. Mood & affect appropriate.     Data Reviewed:   CBC: Recent Labs  Lab 12/20/22 2239 12/21/22 1140 12/24/22 0358 12/26/22 0422  WBC 14.5* 14.4* 14.9* 11.3*  NEUTROABS 12.7*  --   --   --   HGB 10.0* 10.0* 10.3* 11.2*  HCT 28.4* 28.9* 31.3* 33.7*  MCV 91.6 91.2 94.3 92.6  PLT 259 274 261 255    Basic Metabolic Panel: Recent Labs  Lab 12/20/22 2239 12/21/22 0112 12/21/22 1140 12/23/22 0400 12/23/22 0810 12/24/22 0358 12/26/22 0422 12/26/22 1530  NA 133* 137   < > 137 136 139 132* 138  K <2.0* 2.6*   < > 4.1 4.1 3.7  2.7* 3.8  CL 105 106   < > 109 106 100 91* 99  CO2 19* 20*   < > 21* 21* 27 32 27  GLUCOSE 96 136*   < > 163* 202* 150* 107* 110*  BUN 13 13   < > <5* <5* 6 15 19   CREATININE 1.29* 1.03*   < > 1.09* 1.15* 1.08* 0.99 0.97  CALCIUM 7.9* 8.3*   < > 8.3* 8.4* 8.3* 7.9* 8.0*  MG 1.7 1.5*  --   --   --  1.9 2.0  --   PHOS  --  1.7*  --   --   --  3.8  --   --    < > = values in this interval not displayed.    GFR: Estimated Creatinine Clearance: 59.7 mL/min (by C-G formula based on SCr of 0.97 mg/dL).  Liver Function Tests: Recent Labs  Lab 12/20/22 2239  AST 26  ALT 13  ALKPHOS 51  BILITOT 0.4  PROT 5.7*  ALBUMIN 3.1*    CBG: Recent Labs  Lab 12/25/22 0715 12/25/22 1122 12/25/22 1621 12/26/22 1248 12/26/22 1624  GLUCAP 143* 120* 107* 101* 112*     Recent Results (from the past 240 hour(s))  Blood Culture (routine x 2)     Status: None   Collection Time: 12/21/22 12:34 AM   Specimen: BLOOD RIGHT ARM  Result Value Ref Range Status   Specimen Description BLOOD RIGHT ARM  Final   Special Requests   Final    BOTTLES DRAWN AEROBIC AND ANAEROBIC Blood Culture adequate volume   Culture   Final    NO GROWTH 5 DAYS Performed at [facility], [address] [place], [place] [address]    Report Status 12/26/2022 FINAL  Final  Urine Culture (for pregnant, neutropenic or urologic patients or patients with an indwelling urinary catheter)     Status: None   Collection Time: 12/21/22  1:15 AM   Specimen: Urine, Clean Catch  Result Value Ref Range Status   Specimen Description URINE, CLEAN CATCH  Final   Special Requests NONE  Final   Culture   Final    NO GROWTH Performed at [facility], [address] [place], [place] [address]    Report Status 12/23/2022 FINAL  Final  MRSA Next Gen by PCR,  Nasal     Status: None   Collection Time: 12/21/22  8:30 AM   Specimen: Nasal Mucosa; Nasal Swab  Result Value Ref Range Status   MRSA by PCR Next Gen NOT DETECTED NOT  DETECTED Final    Comment: (NOTE) The GeneXpert MRSA Assay (FDA approved for NASAL specimens only), is one component of a comprehensive MRSA colonization surveillance program. It is not intended to diagnose MRSA infection nor to guide or monitor treatment for MRSA infections. Test performance is not FDA approved in patients less than 60 years old. Performed at [facility], [address] [place], [place] [address]   Culture, blood (Routine X 2) w Reflex to ID Panel     Status: None   Collection Time: 12/21/22 11:40 AM   Specimen: BLOOD  Result Value Ref Range Status   Specimen Description BLOOD RIGHT ANTECUBITAL  Final   Special Requests   Final    BOTTLES DRAWN AEROBIC AND ANAEROBIC Blood Culture adequate volume   Culture   Final    NO GROWTH 5 DAYS Performed at [facility], [address], [place], [place] [address]    Report Status 12/26/2022 FINAL  Final         Radiology Studies: No results found.      Scheduled Meds:  Chlorhexidine Gluconate Cloth  6 each Topical Daily   clonazePAM  1 mg Oral QHS   doxepin  75 mg Oral QHS   enoxaparin (LOVENOX) injection  40 mg Subcutaneous Q24H   [START ON 12/27/2022] FLUoxetine  40 mg Oral Daily   hydrocortisone sodium succinate  50 mg Intravenous Q8H   linaclotide  145 mcg Oral QAC breakfast   pantoprazole  40 mg Oral Daily   sodium chloride flush  10-40 mL Intracatheter Q12H   Continuous Infusions:   LOS: 5 days    Time spent: 35 minutes    Ramiro Harvest, MD [facility]   To contact the attending provider between 7A-7P or the covering provider during after hours 7P-7A, please log into the web site [email or web address] and access using universal [place] password for that web site. If you do not have the password, please call the hospital operator.  12/26/2022, 4:52 PM

## 2022-12-27 DIAGNOSIS — E162 Hypoglycemia, unspecified: Secondary | ICD-10-CM | POA: Diagnosis not present

## 2022-12-27 LAB — BASIC METABOLIC PANEL
Anion gap: 7 (ref 5–15)
BUN: 16 mg/dL (ref 6–20)
CO2: 31 mmol/L (ref 22–32)
Calcium: 8 mg/dL — ABNORMAL LOW (ref 8.9–10.3)
Chloride: 100 mmol/L (ref 98–111)
Creatinine, Ser: 0.94 mg/dL (ref 0.44–1.00)
GFR, Estimated: >60 mL/min (ref >60)
Glucose, Bld: 89 mg/dL (ref 70–99)
Potassium: 3.1 mmol/L — ABNORMAL LOW (ref 3.5–5.1)
Sodium: 138 mmol/L (ref 135–145)

## 2022-12-27 LAB — CBC
HCT: 30.3 % — ABNORMAL LOW (ref 36.0–46.0)
Hemoglobin: 10.2 g/dL — ABNORMAL LOW (ref 12.0–15.0)
MCH: 31.2 pg (ref 26.0–34.0)
MCHC: 33.7 g/dL (ref 30.0–36.0)
MCV: 92.7 fL (ref 80.0–100.0)
Platelets: 217 10*3/uL (ref 150–400)
RBC: 3.27 MIL/uL — ABNORMAL LOW (ref 3.87–5.11)
RDW: 13.7 % (ref 11.5–15.5)
WBC: 9.8 10*3/uL (ref 4.0–10.5)
nRBC: 0 % (ref 0.0–0.2)

## 2022-12-27 LAB — GLUCOSE, CAPILLARY
Glucose-Capillary: 89 mg/dL (ref 70–99)
Glucose-Capillary: 135 mg/dL — ABNORMAL HIGH (ref 70–99)
Glucose-Capillary: 107 mg/dL — ABNORMAL HIGH (ref 70–99)
Glucose-Capillary: 134 mg/dL — ABNORMAL HIGH (ref 70–99)

## 2022-12-27 LAB — MAGNESIUM: Magnesium: 2 mg/dL (ref 1.7–2.4)

## 2022-12-27 MED ORDER — HYDRALAZINE HCL 20 MG/ML IJ SOLN: 10.0000 mg | INTRAMUSCULAR | Status: DC | PRN

## 2022-12-27 MED ORDER — HYDROCORTISONE 1 % EX CREA: 1.0000 | TOPICAL_CREAM | Freq: Three times a day (TID) | CUTANEOUS | Status: DC | PRN

## 2022-12-27 MED ORDER — IPRATROPIUM-ALBUTEROL 0.5-2.5 (3) MG/3ML IN SOLN: 3.0000 mL | RESPIRATORY_TRACT | Status: DC | PRN

## 2022-12-27 MED ORDER — HYDROCORTISONE 20 MG PO TABS
50.0000 mg | ORAL_TABLET | Freq: Two times a day (BID) | ORAL | Status: DC
  Administered 2022-12-27 – 2022-12-28 (×3): 50 mg via ORAL
  Filled 2022-12-27 (×4): qty 1

## 2022-12-27 MED ORDER — LIP MEDEX EX OINT: 1.0000 | TOPICAL_OINTMENT | CUTANEOUS | Status: DC | PRN

## 2022-12-27 MED ORDER — LORATADINE 10 MG PO TABS: 10.0000 mg | ORAL_TABLET | Freq: Every day | ORAL | Status: DC | PRN

## 2022-12-27 MED ORDER — POLYVINYL ALCOHOL 1.4 % OP SOLN: 1.0000 [drp] | OPHTHALMIC | Status: DC | PRN

## 2022-12-27 MED ORDER — ALUM & MAG HYDROXIDE-SIMETH 200-200-20 MG/5ML PO SUSP: 30.0000 mL | ORAL | Status: DC | PRN

## 2022-12-27 MED ORDER — PHENOL 1.4 % MT LIQD: 1.0000 | OROMUCOSAL | Status: DC | PRN

## 2022-12-27 MED ORDER — HYDROCORTISONE 20 MG PO TABS: 50.0000 mg | ORAL_TABLET | Freq: Every day | ORAL | Status: DC

## 2022-12-27 MED ORDER — SALINE SPRAY 0.65 % NA SOLN: 1.0000 | NASAL | Status: DC | PRN

## 2022-12-27 MED ORDER — METOPROLOL TARTRATE 5 MG/5ML IV SOLN: 5.0000 mg | INTRAVENOUS | Status: DC | PRN

## 2022-12-27 MED ORDER — MUSCLE RUB 10-15 % EX CREA: 1.0000 | TOPICAL_CREAM | CUTANEOUS | Status: DC | PRN

## 2022-12-27 MED ORDER — HYDROCORTISONE (PERIANAL) 2.5 % EX CREA: 1.0000 | TOPICAL_CREAM | Freq: Four times a day (QID) | CUTANEOUS | Status: DC | PRN

## 2022-12-27 MED ORDER — POTASSIUM CHLORIDE CRYS ER 20 MEQ PO TBCR
40.0000 meq | EXTENDED_RELEASE_TABLET | Freq: Once | ORAL | Status: AC
  Administered 2022-12-27: 40 meq via ORAL
  Filled 2022-12-27: qty 2

## 2022-12-27 MED ORDER — POTASSIUM CHLORIDE 10 MEQ/100ML IV SOLN
10.0000 meq | INTRAVENOUS | Status: AC
  Administered 2022-12-27 (×4): 10 meq via INTRAVENOUS
  Filled 2022-12-27 (×4): qty 100

## 2022-12-27 NOTE — Consult Note (Signed)
[facility] Psychiatry Face-to-Face Psychiatric Evaluation   Service Date: Dec 27, 2022 LOS:  LOS: 6 days    Assessment  The patient is a 52 year old female with no outpatient psychiatric notes or behavioral health admissions available for review.  She has an outpatient psychiatrist who prescribes her Klonopin and Adderall.  The patient has a past medical history of rectal prolapse, irritable bowel syndrome, and GERD.  She was medically admitted on 4/24 after being found unresponsive.  She was found to have a blood glucose of 22.  Lab work showed high insulin and low C-peptide, consistent with exogenous insulin administration (in a patient who has no history of type I or type 2 diabetes).  Psychiatry was consulted on 4/29 for the patient's husband being concerned suicidal intention.  Based on the patient's reported symptomatology, she does not meet criteria for major depressive disorder.  Feel that the patient is underreporting her symptoms.  Based on her husband's report of symptoms, the patient certainly exhibits hypersomnia and lack of energy and anhedonia.  This is consistent with a diagnosis of major depressive disorder.  The patient also meets criteria for generalized anxiety disorder.  Recommend inpatient psychiatric hospitalization.  The patient reports taking several Xanax not prescribed to her and injecting herself with insulin.  This behavior is consistent with intentions of self-harm, even if this was facilitated by intoxication with Xanax.  Collateral from her husband is decisive, with him reporting severe depression and the patient making many suicidal statements since the patient began having health issues.  The patient is currently amenable to behavioral health hospitalization.  She will need to be placed under involuntary commitment should she try to leave because she is at high risk for completed suicide.  The patient's husband feels that the patient has minimal elopement risk.   Will leave the patient's status as voluntary to avoid a sheriff's office transfer to behavioral health.  4/30: Patient remains amenable to [facility] hospitalization.  Potassium low at 2.7.  Will wait for the patient to be medically ready for discharge.  Planning to decrease the patient's home Prozac from 60 mg daily to 40 mg daily, in anticipation of transition to the [facility] behavioral hospital.  5/1: Patient seen and assessed by this NP, she continues to minimize her symptoms of depression and denies suicidal intent despite previous suggestion and lab work consistent with an overdose on insulin. She is very welcoming and engages well she continues to appear motivated to seek inpatient treatments " so that I can move on and get my life back. " She denies any symptoms or adverse reactions as it relates to dose reduction in her Prozac. She is eating her lunch, denies any eating disturbances and denies any sleep disruptions at this time. She denies si/hi/avh.   Diagnoses:  Active Hospital problems: Principal Problem:   Hypoglycemia Active Problems:   UTI (urinary tract infection)   Hypokalemia   IBS (irritable bowel syndrome)   Generalized anxiety disorder   CKD stage 3a, GFR 45-59 ml/min (HCC)   Chronic anemia   Major depressive disorder, recurrent episode, severe (HCC)     Plan  ## Safety and Observation Level:  - Based on my clinical evaluation, I estimate the patient to be at low-moderate risk of self harm in the current setting. Her risk for elopement is also low, and she appears future oriented to seek help in an inpatient setting. At this time we recommend ongoing continuous observation via a telesitter. In the interim we  can continue suicide precautions and mitigate any additional risks to suicide attempts in this current setting.  This decision is based on my review of the chart including patient's history and current presentation, interview of the patient, mental status examination, and  consideration of suicide risk including evaluating suicidal ideation, plan, intent, suicidal or self-harm behaviors, risk factors, and protective factors. This judgment is based on our ability to directly address suicide risk, implement suicide prevention strategies and develop a safety plan while the patient is in the clinical setting. Please contact our team if there is a concern that risk level has changed.   ## Medications:  -- Continue home Klonopin and doxepin, both scheduled nightly - Continue 40mg  po Prozac daily at this time.   ## Medical Decision Making Capacity:  Not assessed on this encounter  ## Further Work-up:  Per primary  ## Disposition:  Inpatient psychiatry  ## Behavioral / Environmental:  1:1 currently; telesitter is OPTIONAL due to elopement risk, suicide measures in place and ability to contract for safety.   ##Legal Status VOL  Thank you for this consult request. Recommendations have been communicated to the primary team.  We will follow at this time.   Maryagnes Amos, FNP   NEW history  Relevant Aspects of Hospital Course:  Admitted on 12/20/2022 for AMS.  Patient Report:  Pertinent history is gathered from the patient as follows. For ease of reading, objective statements that are made in the paragraphs that follow actually reflect subjective report of the patient.  The patient was born and raised in [place].  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  She stopped working 2 years ago because of physical health reasons.  She talks about becoming septic in 2022.  She reports that her husband, [name], does Theme park manager and she says that they have a good relationship.  She denies abusing alcohol or illegal drugs.  The patient mainly identifies her physical ailments as her stressors.  She reports difficulties with her vision, irritable bowel syndrome, as her most problematic symptoms.  She reports that she has a 62 year old  child who lives at home with them.  She reports that her parents are nearby and that her husband is supportive.  The patient denies having any access to guns.  The patient admits to some depression related to her physical problems.  She denies experiencing any suicidal thoughts over the past several weeks.  She reports good sleep with her medication regimen.  She reports poor concentration and feelings of guilt.  She reports no anhedonia.  The patient reports worrying about multiple different topics, such as her children and finances.  She reports fatigue, and previously listed insomnia and concentration difficulties.  She denies experiencing panic attacks.  Bipolar affective disorder screen is negative.  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  She insist that she only injected 2 units of insulin several times.  She states she is unsure whether or not she was trying to kill herself.  On interview 05/01:  The patient is observed to be sitting upright eating lunch. She smiles appropriately and waves at this provider. She has clear linear thought processes. She denies any depressive symptoms, mania, anxiety, psychosis, or stressors at this time that will exacerbate her current symptoms.  She continues to deny  any suicidal thoughts, ideations, intention, behaviors or gestures at this time. She continues to lack insight and poor impulse control as it relates to her suicide attempt. She continues to meet criteria for inpatient psychiatry at this time.   ROS:  As above  Collateral information:  Ren Grasse, spouse, contacted with consent of the patient. [phone] She has threatened to kill herself 50 times over the past several years.  She experiences depression characterized by: lies in bed and only gets up to go to the  bathroom. Yesterday she said that it was a suicide attempt.  Guns removed to his car a week or so ago.  She gets "paranoid", thinking that people are talking about her.  No alcohol or drugs but does abuse her prescribed meds and then takes his medications (mainly Xanax). 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   Elopement risk: appears low based on his estimate  Psychiatric History:  Information collected from patient, EMR  Family psych history: none   Social History:  As above   Family History:  The patient's family history includes AAA (abdominal aortic aneurysm) in her father; Breast cancer in her paternal grandmother; Cancer in her mother; Depression in her mother; Diabetes in her maternal grandfather; Heart attack in her father; Hypertension in her father; Hyperthyroidism in her sister; Hypothyroidism in her mother and sister; Thyroid disease in her mother.  Medical History: Past Medical History:  Diagnosis Date   Abnormal TSH    ADD (attention deficit disorder)    Anemia    Anxiety    Arrhythmia    Decreased libido    Depression    Heart murmur    Hyperlipidemia    Hypokalemia    Migraine    Nephrolithiasis    Pap smear abnormality of cervix/human papillomavirus (HPV) positive    Sleep difficulties    SVT (supraventricular tachycardia)     Surgical History: Past Surgical History:  Procedure Laterality Date   ABLATION     Cardiac ablation, endometrial ablation   BREAST LUMPECTOMY     right breast   BREAST LUMPECTOMY Right    CARDIOVERSION     COLONOSCOPY WITH PROPOFOL N/A 05/16/2021   Procedure: COLONOSCOPY WITH PROPOFOL;  Surgeon: Charlott Rakes, MD;  Location: [facility];  Service: Endoscopy;  Laterality: N/A;   COLPOSCOPY     w/ cervical biopsy   DILITATION & CURRETTAGE/HYSTROSCOPY WITH NOVASURE ABLATION N/A 09/11/2014   Procedure: DILATATION & CURETTAGE/HYSTEROSCOPY WITH NOVASURE ABLATION;  Surgeon: Lenoard Aden, MD;   Location: [facility];  Service: Gynecology;  Laterality: N/A;   GIVENS CAPSULE STUDY N/A 05/16/2021   Procedure: GIVENS CAPSULE STUDY;  Surgeon: Charlott Rakes, MD;  Location: [facility];  Service: Endoscopy;  Laterality: N/A;   XI ROBOT ASSISTED RECTOPEXY N/A 07/07/2021   Procedure: XI ROBOT ASSISTED RECTOPEXY;  Surgeon: Karie Soda, MD;  Location: [facility];  Service: General;  Laterality: N/A;   XI ROBOTIC ASSISTED LOWER ANTERIOR RESECTION N/A 07/07/2021   Procedure: XI ROBOTIC ASSISTED LOW ANTERIOR RECTOSIGMOID RESECTION WITH RIGID PROCTOSCOPY, TAP BLOCK;  Surgeon: Karie Soda, MD;  Location: [facility];  Service: General;  Laterality: N/A;    Medications:   Current Facility-Administered Medications:    alum & mag hydroxide-simeth (MAALOX/MYLANTA) 200-200-20 MG/5ML suspension 30 mL, 30 mL, Oral, Q4H PRN, Amin, Ankit Chirag, MD   Chlorhexidine Gluconate Cloth 2 % PADS 6 each, 6 each, Topical, Daily, Leslye Peer, MD, 6 each at 12/27/22 0981   clonazePAM (  KLONOPIN) tablet 1 mg, 1 mg, Oral, QHS, Byrum, Les Pou, MD, 1 mg at 12/26/22 2125   diphenhydrAMINE (BENADRYL) capsule 25 mg, 25 mg, Oral, Q6H PRN, Leslye Peer, MD, 25 mg at 12/24/22 1335   docusate sodium (COLACE) capsule 100 mg, 100 mg, Oral, BID PRN, Leslye Peer, MD, 100 mg at 12/23/22 1723   doxepin (SINEQUAN) capsule 75 mg, 75 mg, Oral, QHS, Byrum, Les Pou, MD, 75 mg at 12/26/22 2125   enoxaparin (LOVENOX) injection 40 mg, 40 mg, Subcutaneous, Q24H, Arrien, York Ram, MD, 40 mg at 12/27/22 1159   FLUoxetine (PROZAC) capsule 40 mg, 40 mg, Oral, Daily, Carlyn Reichert, MD, 40 mg at 12/27/22 1610   hydrALAZINE (APRESOLINE) injection 10 mg, 10 mg, Intravenous, Q4H PRN, Amin, Ankit Chirag, MD   hydrocortisone (ANUSOL-HC) 2.5 % rectal cream 1 Application, 1 Application, Topical, QID PRN, Amin, Ankit Chirag, MD   hydrocortisone (CORTEF) tablet 50 mg, 50 mg, Oral, BID, 50 mg at 12/27/22 1212 **FOLLOWED BY** [START ON 12/29/2022]  hydrocortisone (CORTEF) tablet 50 mg, 50 mg, Oral, Daily, Amin, Ankit Chirag, MD   hydrocortisone cream 1 % 1 Application, 1 Application, Topical, TID PRN, Amin, Ankit Chirag, MD   ipratropium-albuterol (DUONEB) 0.5-2.5 (3) MG/3ML nebulizer solution 3 mL, 3 mL, Nebulization, Q4H PRN, Amin, Ankit Chirag, MD   linaclotide (LINZESS) capsule 145 mcg, 145 mcg, Oral, QAC breakfast, Arrien, York Ram, MD, 145 mcg at 12/27/22 9604   lip balm (CARMEX) ointment 1 Application, 1 Application, Topical, PRN, Amin, Ankit Chirag, MD   loratadine (CLARITIN) tablet 10 mg, 10 mg, Oral, Daily PRN, Amin, Ankit Chirag, MD   metoprolol tartrate (LOPRESSOR) injection 5 mg, 5 mg, Intravenous, Q4H PRN, Amin, Ankit Chirag, MD   Muscle Rub CREA 1 Application, 1 Application, Topical, PRN, Amin, Ankit Chirag, MD   ondansetron (ZOFRAN) injection 4 mg, 4 mg, Intravenous, Q8H PRN, Leslye Peer, MD, 4 mg at 12/22/22 0947   Oral care mouth rinse, 15 mL, Mouth Rinse, PRN, Pokhrel, Laxman, MD   pantoprazole (PROTONIX) EC tablet 40 mg, 40 mg, Oral, Daily, Arrien, York Ram, MD, 40 mg at 12/27/22 0923   phenol (CHLORASEPTIC) mouth spray 1 spray, 1 spray, Mouth/Throat, PRN, Amin, Ankit Chirag, MD   polyethylene glycol (MIRALAX / GLYCOLAX) packet 17 g, 17 g, Oral, Daily PRN, Leslye Peer, MD, 17 g at 12/23/22 1723   polyvinyl alcohol (LIQUIFILM TEARS) 1.4 % ophthalmic solution 1 drop, 1 drop, Both Eyes, PRN, Amin, Ankit Chirag, MD   potassium chloride 10 mEq in 100 mL IVPB, 10 mEq, Intravenous, Q1 Hr x 4, Amin, Ankit Chirag, MD, Last Rate: 100 mL/hr at 12/27/22 1148, 10 mEq at 12/27/22 1148   sodium chloride (OCEAN) 0.65 % nasal spray 1 spray, 1 spray, Each Nare, PRN, Amin, Ankit Chirag, MD   sodium chloride flush (NS) 0.9 % injection 10-40 mL, 10-40 mL, Intracatheter, Q12H, Byrum, Les Pou, MD, 20 mL at 12/27/22 0923   sodium chloride flush (NS) 0.9 % injection 10-40 mL, 10-40 mL, Intracatheter, PRN, Leslye Peer,  MD  Allergies: Allergies  Allergen Reactions   Zithromax [Azithromycin] Rash       Objective  Vital signs:  Temp:  [97.7 F (36.5 C)-98.8 F (37.1 C)] 98.4 F (36.9 C) (05/01 0741) Pulse Rate:  [74-90] 83 (05/01 0741) Resp:  [16-20] 17 (05/01 0741) BP: (109-128)/(74-91) 109/74 (05/01 0741) SpO2:  [98 %-100 %] 99 % (05/01 0741) Weight:  [55.7 kg] 55.7 kg (05/01 0530)  Psychiatric Specialty  Exam: Physical Exam Constitutional:      Appearance: the patient is not toxic-appearing.  Pulmonary:     Effort: Pulmonary effort is normal.  Neurological:     General: No focal deficit present.     Mental Status: the patient is alert and oriented to person, place, and time.   Review of Systems  Respiratory:  Negative for shortness of breath.   Cardiovascular:  Negative for chest pain.  Gastrointestinal:  Negative for abdominal pain, constipation, diarrhea, nausea and vomiting.  Neurological:  Negative for headaches.      BP 109/74 (BP Location: Left Arm)   Pulse 83   Temp 98.4 F (36.9 C) (Oral)   Resp 17   Ht 5' 4.75" (1.645 m)   Wt 55.7 kg   SpO2 99%   BMI 20.59 kg/m   General Appearance: Fairly Groomed  Eye Contact:  Good  Speech:  Clear and Coherent  Volume:  Normal  Mood:  Euthymic  Affect:  incongruent - bright, anxious, occasionally tearful  Thought Process:  Coherent  Orientation:  Full (Time, Place, and Person)  Thought Content: Logical   Suicidal Thoughts:  No  Homicidal Thoughts:  No  Memory:  Immediate;   Good  Judgement:  poor  Insight:  shallow  Psychomotor Activity:  Normal  Concentration:  Concentration: Good  Recall:  Good  Fund of Knowledge: Good  Language: Good  Akathisia:  No  Handed:    AIMS (if indicated): not done  Assets:  Communication Skills Desire for Improvement Financial Resources/Insurance Housing Leisure Time Physical Health  ADL's:  Intact  Cognition: WNL  Sleep:  Fair   Carlyn Reichert, MD PGY-2

## 2022-12-27 NOTE — Progress Notes (Signed)
PROGRESS NOTE    Emily Park  MPN:[id number] DOB: [birth year] DOA: 12/20/2022 PCP: Tresa Garter, MD   Brief Narrative:  52 yo female with the past medical history of GERD, IBS, and anxiety who presented with altered mental status. Patient was found unresponsive on the floor by her husband. EMS was called and she was found hypotensive 70/48, with glucose 22, and having a possible seizure. She was given IV fluids, started on IV epinephrine and received one amp D50 IV, and transported to the ED. On her physical examination her blood pressure was 90/57, HR 76, RR 20 and 02 saturation 100%, patient was awake and alert, lungs with no wheezing or rales, heart with S1 and S2 present and rhythmic, abdomen with no distention, left thigh with levido reticularis, no lower extremity edema.  There is concerns of intentional insulin overdose.  Seen by psychiatry, initially recommended outpatient follow-up but eventually would benefit from inpatient transition once medically stable.   Assessment & Plan:  Principal Problem:   Hypoglycemia Active Problems:   Generalized anxiety disorder   CKD stage 3a, GFR 45-59 ml/min (HCC)   Chronic anemia   UTI (urinary tract infection)   IBS (irritable bowel syndrome)   Hypokalemia   Major depressive disorder, recurrent episode, severe (HCC)     Assessment and Plan: * Hypoglycemia Initially on octreotide, IV steroids and dextrose infusion.  Dextrose and octreotide discontinued.  Hydrocortisone has been transitioned to every 12, will continue to taper steroids (orders placed).  Continue supportive care.   Hypokalemia Continue aggressive repletion  Generalized anxiety disorder Seen by catchy-inpatient psych admission is recommended -Continue fluoxetine 40 mg daily, Klonopin 1 mg nightly, doxepin 75 mg nightly. Continue one-to-one sitter  CKD stage 3a, GFR 45-59 ml/min (HCC) Renal function stable at 0.9   Chronic anemia Hemoglobin stable around  11  UTI (urinary tract infection) Urine cultures with no growth.  Completed course of IV Rocephin  IBS (irritable bowel syndrome) Continue with pantoprazole and linaclotide.   Major depressive disorder, recurrent episode, severe (HCC) Management per psychiatry team.  Currently on Prozac, doxepin and Klonopin      DVT prophylaxis: enoxaparin (LOVENOX) injection 40 mg Start: 12/25/22 1315 SCDs Start: 12/21/22 0103 Code Status: Full code Family Communication:   Status is: Inpatient Planning to taper off steroids and managing potassium levels.  Hopefully will be able to transition patient to inpatient psych over next 24-48 hours      Diet Orders (From admission, onward)     Start     Ordered   12/21/22 0104  Diet regular Room service appropriate? Yes; Fluid consistency: Thin  Diet effective now       Question Answer Comment  Room service appropriate? Yes   Fluid consistency: Thin      12/21/22 0104            Subjective: Patient doing okay, does not have any complaints   Examination:  General exam: Appears calm and comfortable  Respiratory system: Clear to auscultation. Respiratory effort normal. Cardiovascular system: S1 & S2 heard, RRR. No JVD, murmurs, rubs, gallops or clicks. No pedal edema. Gastrointestinal system: Abdomen is nondistended, soft and nontender. No organomegaly or masses felt. Normal bowel sounds heard. Central nervous system: Alert and oriented. No focal neurological deficits. Extremities: Symmetric 5 x 5 power. Skin: No rashes, lesions or ulcers Psychiatry: Judgement and insight appear normal. Mood & affect appropriate.  Objective: Vitals:   12/26/22 1947 12/27/22 0506 12/27/22 0530 12/27/22 0741  BP:  128/83 (!) 125/91  109/74  Pulse: 90 74  83  Resp: 19 16  17   Temp: 98.8 F (37.1 C) 97.7 F (36.5 C)  98.4 F (36.9 C)  TempSrc: Oral Oral  Oral  SpO2: 98% 98%  99%  Weight:   55.7 kg   Height:        Intake/Output Summary (Last  24 hours) at 12/27/2022 0927 Last data filed at 12/27/2022 1191 Gross per 24 hour  Intake 720 ml  Output --  Net 720 ml   Filed Weights   12/24/22 0408 12/25/22 0512 12/27/22 0530  Weight: 63 kg 56.6 kg 55.7 kg    Scheduled Meds:  Chlorhexidine Gluconate Cloth  6 each Topical Daily   clonazePAM  1 mg Oral QHS   doxepin  75 mg Oral QHS   enoxaparin (LOVENOX) injection  40 mg Subcutaneous Q24H   FLUoxetine  40 mg Oral Daily   hydrocortisone sodium succinate  50 mg Intravenous Q12H   linaclotide  145 mcg Oral QAC breakfast   pantoprazole  40 mg Oral Daily   potassium chloride  40 mEq Oral Once   sodium chloride flush  10-40 mL Intracatheter Q12H   Continuous Infusions:  potassium chloride      Nutritional status     Body mass index is 20.59 kg/m.  Data Reviewed:   CBC: Recent Labs  Lab 12/20/22 2239 12/21/22 1140 12/24/22 0358 12/26/22 0422 12/27/22 0500  WBC 14.5* 14.4* 14.9* 11.3* 9.8  NEUTROABS 12.7*  --   --   --   --   HGB 10.0* 10.0* 10.3* 11.2* 10.2*  HCT 28.4* 28.9* 31.3* 33.7* 30.3*  MCV 91.6 91.2 94.3 92.6 92.7  PLT 259 274 261 255 217   Basic Metabolic Panel: Recent Labs  Lab 12/20/22 2239 12/21/22 0112 12/21/22 1140 12/23/22 0810 12/24/22 0358 12/26/22 0422 12/26/22 1530 12/27/22 0500  NA 133* 137   < > 136 139 132* 138 138  K <2.0* 2.6*   < > 4.1 3.7 2.7* 3.8 3.1*  CL 105 106   < > 106 100 91* 99 100  CO2 19* 20*   < > 21* 27 32 27 31  GLUCOSE 96 136*   < > 202* 150* 107* 110* 89  BUN 13 13   < > <5* 6 15 19 16   CREATININE 1.29* 1.03*   < > 1.15* 1.08* 0.99 0.97 0.94  CALCIUM 7.9* 8.3*   < > 8.4* 8.3* 7.9* 8.0* 8.0*  MG 1.7 1.5*  --   --  1.9 2.0  --  2.0  PHOS  --  1.7*  --   --  3.8  --   --   --    < > = values in this interval not displayed.   GFR: Estimated Creatinine Clearance: 60.9 mL/min (by C-G formula based on SCr of 0.94 mg/dL). Liver Function Tests: Recent Labs  Lab 12/20/22 2239  AST 26  ALT 13  ALKPHOS 51  BILITOT  0.4  PROT 5.7*  ALBUMIN 3.1*   No results for input(s): "LIPASE", "AMYLASE" in the last 168 hours. No results for input(s): "AMMONIA" in the last 168 hours. Coagulation Profile: Recent Labs  Lab 12/20/22 2239  INR 1.1   Cardiac Enzymes: No results for input(s): "CKTOTAL", "CKMB", "CKMBINDEX", "TROPONINI" in the last 168 hours. BNP (last 3 results) No results for input(s): "PROBNP" in the last 8760 hours. HbA1C: No results for input(s): "HGBA1C" in the last 72 hours. CBG: Recent Labs  Lab 12/26/22 1248 12/26/22 1624 12/26/22 1736 12/27/22 0510 12/27/22 0735  GLUCAP 101* 112* 114* 89 135*   Lipid Profile: No results for input(s): "CHOL", "HDL", "LDLCALC", "TRIG", "CHOLHDL", "LDLDIRECT" in the last 72 hours. Thyroid Function Tests: No results for input(s): "TSH", "T4TOTAL", "FREET4", "T3FREE", "THYROIDAB" in the last 72 hours. Anemia Panel: No results for input(s): "VITAMINB12", "FOLATE", "FERRITIN", "TIBC", "IRON", "RETICCTPCT" in the last 72 hours. Sepsis Labs: Recent Labs  Lab 12/20/22 2253 12/21/22 0112  LATICACIDVEN 2.5* 2.1*    Recent Results (from the past 240 hour(s))  Blood Culture (routine x 2)     Status: None   Collection Time: 12/21/22 12:34 AM   Specimen: BLOOD RIGHT ARM  Result Value Ref Range Status   Specimen Description BLOOD RIGHT ARM  Final   Special Requests   Final    BOTTLES DRAWN AEROBIC AND ANAEROBIC Blood Culture adequate volume   Culture   Final    NO GROWTH 5 DAYS Performed at [facility], [address] [place], [place] [address]    Report Status 12/26/2022 FINAL  Final  Urine Culture (for pregnant, neutropenic or urologic patients or patients with an indwelling urinary catheter)     Status: None   Collection Time: 12/21/22  1:15 AM   Specimen: Urine, Clean Catch  Result Value Ref Range Status   Specimen Description URINE, CLEAN CATCH  Final   Special Requests NONE  Final   Culture   Final    NO GROWTH Performed at [facility], [address] [place], [place] [address]    Report Status 12/23/2022 FINAL  Final  MRSA Next Gen by PCR, Nasal     Status: None   Collection Time: 12/21/22  8:30 AM   Specimen: Nasal Mucosa; Nasal Swab  Result Value Ref Range Status   MRSA by PCR Next Gen NOT DETECTED NOT DETECTED Final    Comment: (NOTE) The GeneXpert MRSA Assay (FDA approved for NASAL specimens only), is one component of a comprehensive MRSA colonization surveillance program. It is not intended to diagnose MRSA infection nor to guide or monitor treatment for MRSA infections. Test performance is not FDA approved in patients less than 34 years old. Performed at [facility], [address], [place], [place] [address]   Culture, blood (Routine X 2) w Reflex to ID Panel     Status: None   Collection Time: 12/21/22 11:40 AM   Specimen: BLOOD  Result Value Ref Range Status   Specimen Description BLOOD RIGHT ANTECUBITAL  Final   Special Requests   Final    BOTTLES DRAWN AEROBIC AND ANAEROBIC Blood Culture adequate volume   Culture   Final    NO GROWTH 5 DAYS Performed at [facility], [address], [place], [place] [address]    Report Status 12/26/2022 FINAL  Final         Radiology Studies: No results found.         LOS: 6 days   Time spent= 35 mins    Emily Park Joline Maxcy, MD [facility]  If 7PM-7AM, please contact night-coverage  12/27/2022, 9:27 AM

## 2022-12-28 ENCOUNTER — Inpatient Hospital Stay (HOSPITAL_COMMUNITY)
Admission: AD | Admit: 2022-12-28 | Discharge: 2023-01-04 | DRG: 885 | Disposition: A | Discharge status: Home / Self Care | Payer: Commercial Managed Care - PPO | Source: Other Acute Inpatient Hospital | Attending: Psychiatry | Admitting: Psychiatry

## 2022-12-28 ENCOUNTER — Other Ambulatory Visit: Payer: Self-pay

## 2022-12-28 ENCOUNTER — Encounter (HOSPITAL_COMMUNITY): Payer: Self-pay | Admitting: Family

## 2022-12-28 DIAGNOSIS — E785 Hyperlipidemia, unspecified: Secondary | ICD-10-CM | POA: Diagnosis present

## 2022-12-28 DIAGNOSIS — E271 Primary adrenocortical insufficiency: Secondary | ICD-10-CM | POA: Diagnosis not present

## 2022-12-28 DIAGNOSIS — T383X1A Poisoning by insulin and oral hypoglycemic [antidiabetic] drugs, accidental (unintentional), initial encounter: Secondary | ICD-10-CM | POA: Diagnosis not present

## 2022-12-28 DIAGNOSIS — F419 Anxiety disorder, unspecified: Secondary | ICD-10-CM | POA: Diagnosis present

## 2022-12-28 DIAGNOSIS — K219 Gastro-esophageal reflux disease without esophagitis: Secondary | ICD-10-CM | POA: Diagnosis present

## 2022-12-28 DIAGNOSIS — G43909 Migraine, unspecified, not intractable, without status migrainosus: Secondary | ICD-10-CM | POA: Diagnosis present

## 2022-12-28 DIAGNOSIS — K589 Irritable bowel syndrome without diarrhea: Secondary | ICD-10-CM | POA: Diagnosis present

## 2022-12-28 DIAGNOSIS — Z818 Family history of other mental and behavioral disorders: Secondary | ICD-10-CM

## 2022-12-28 DIAGNOSIS — N179 Acute kidney failure, unspecified: Secondary | ICD-10-CM | POA: Diagnosis not present

## 2022-12-28 DIAGNOSIS — G4700 Insomnia, unspecified: Secondary | ICD-10-CM | POA: Diagnosis present

## 2022-12-28 DIAGNOSIS — E162 Hypoglycemia, unspecified: Secondary | ICD-10-CM | POA: Diagnosis not present

## 2022-12-28 DIAGNOSIS — F332 Major depressive disorder, recurrent severe without psychotic features: Principal | ICD-10-CM | POA: Diagnosis present

## 2022-12-28 DIAGNOSIS — Z79899 Other long term (current) drug therapy: Secondary | ICD-10-CM

## 2022-12-28 DIAGNOSIS — E876 Hypokalemia: Secondary | ICD-10-CM | POA: Diagnosis present

## 2022-12-28 DIAGNOSIS — G928 Other toxic encephalopathy: Secondary | ICD-10-CM | POA: Diagnosis not present

## 2022-12-28 DIAGNOSIS — Z5941 Food insecurity: Secondary | ICD-10-CM

## 2022-12-28 DIAGNOSIS — F411 Generalized anxiety disorder: Secondary | ICD-10-CM | POA: Diagnosis present

## 2022-12-28 LAB — GLUCOSE, CAPILLARY
Glucose-Capillary: 81 mg/dL (ref 70–99)
Glucose-Capillary: 145 mg/dL — ABNORMAL HIGH (ref 70–99)

## 2022-12-28 LAB — BASIC METABOLIC PANEL
Anion gap: 5 (ref 5–15)
BUN: 13 mg/dL (ref 6–20)
CO2: 28 mmol/L (ref 22–32)
Calcium: 8.3 mg/dL — ABNORMAL LOW (ref 8.9–10.3)
Chloride: 104 mmol/L (ref 98–111)
Creatinine, Ser: 0.79 mg/dL (ref 0.44–1.00)
GFR, Estimated: >60 mL/min (ref >60)
Glucose, Bld: 117 mg/dL — ABNORMAL HIGH (ref 70–99)
Potassium: 3.5 mmol/L (ref 3.5–5.1)
Sodium: 137 mmol/L (ref 135–145)

## 2022-12-28 LAB — CBC
HCT: 30.1 % — ABNORMAL LOW (ref 36.0–46.0)
Hemoglobin: 10.2 g/dL — ABNORMAL LOW (ref 12.0–15.0)
MCH: 31.2 pg (ref 26.0–34.0)
MCHC: 33.9 g/dL (ref 30.0–36.0)
MCV: 92 fL (ref 80.0–100.0)
Platelets: 224 10*3/uL (ref 150–400)
RBC: 3.27 MIL/uL — ABNORMAL LOW (ref 3.87–5.11)
RDW: 13.6 % (ref 11.5–15.5)
WBC: 9.9 10*3/uL (ref 4.0–10.5)
nRBC: 0 % (ref 0.0–0.2)

## 2022-12-28 LAB — MAGNESIUM: Magnesium: 2.1 mg/dL (ref 1.7–2.4)

## 2022-12-28 MED ORDER — CLONAZEPAM 0.5 MG PO TABS
1.0000 mg | ORAL_TABLET | Freq: Every day | ORAL | Status: DC
  Administered 2022-12-28 – 2023-01-03 (×7): 1 mg via ORAL
  Filled 2022-12-28 (×7): qty 2

## 2022-12-28 MED ORDER — DOXEPIN HCL 75 MG PO CAPS
75.0000 mg | ORAL_CAPSULE | Freq: Every day | ORAL | Status: DC
  Administered 2022-12-28 – 2023-01-03 (×7): 75 mg via ORAL
  Filled 2022-12-28: qty 1
  Filled 2022-12-28: qty 3
  Filled 2022-12-28 (×8): qty 1

## 2022-12-28 MED ORDER — PANTOPRAZOLE SODIUM 40 MG PO TBEC
40.0000 mg | DELAYED_RELEASE_TABLET | Freq: Every day | ORAL | Status: DC
  Administered 2022-12-29 – 2023-01-04 (×7): 40 mg via ORAL
  Filled 2022-12-28 (×10): qty 1

## 2022-12-28 MED ORDER — HYDROCORTISONE 10 MG PO TABS: 50.0000 mg | ORAL_TABLET | Freq: Every day | ORAL | 0 refills | Status: DC

## 2022-12-28 MED ORDER — LORAZEPAM 2 MG/ML IJ SOLN: 2.0000 mg | Freq: Three times a day (TID) | INTRAMUSCULAR | Status: DC | PRN

## 2022-12-28 MED ORDER — DIPHENHYDRAMINE HCL 25 MG PO CAPS
50.0000 mg | ORAL_CAPSULE | Freq: Three times a day (TID) | ORAL | Status: DC | PRN
  Administered 2022-12-30: 50 mg via ORAL
  Filled 2022-12-28: qty 2

## 2022-12-28 MED ORDER — DIPHENHYDRAMINE HCL 50 MG/ML IJ SOLN: 50.0000 mg | Freq: Three times a day (TID) | INTRAMUSCULAR | Status: DC | PRN

## 2022-12-28 MED ORDER — ACETAMINOPHEN 325 MG PO TABS
650.0000 mg | ORAL_TABLET | Freq: Four times a day (QID) | ORAL | Status: DC | PRN
  Administered 2022-12-30 – 2023-01-02 (×4): 650 mg via ORAL
  Filled 2022-12-28 (×4): qty 2

## 2022-12-28 MED ORDER — IPRATROPIUM-ALBUTEROL 0.5-2.5 (3) MG/3ML IN SOLN: 3.0000 mL | RESPIRATORY_TRACT | Status: DC | PRN

## 2022-12-28 MED ORDER — POLYVINYL ALCOHOL 1.4 % OP SOLN: 1.0000 [drp] | OPHTHALMIC | 0 refills | Status: AC | PRN

## 2022-12-28 MED ORDER — PNEUMOCOCCAL VAC POLYVALENT 25 MCG/0.5ML IJ INJ
0.5000 mL | INJECTION | INTRAMUSCULAR | Status: DC
  Filled 2022-12-28: qty 0.5

## 2022-12-28 MED ORDER — FLUOXETINE HCL 20 MG PO CAPS
40.0000 mg | ORAL_CAPSULE | Freq: Every day | ORAL | Status: DC
  Administered 2022-12-29: 40 mg via ORAL
  Filled 2022-12-28 (×3): qty 2

## 2022-12-28 MED ORDER — LINACLOTIDE 145 MCG PO CAPS
145.0000 ug | ORAL_CAPSULE | Freq: Every day | ORAL | Status: DC
  Administered 2022-12-29 – 2023-01-04 (×7): 145 ug via ORAL
  Filled 2022-12-28 (×10): qty 1

## 2022-12-28 MED ORDER — POTASSIUM CHLORIDE CRYS ER 20 MEQ PO TBCR: 40.0000 meq | EXTENDED_RELEASE_TABLET | Freq: Every day | ORAL | 0 refills | Status: DC

## 2022-12-28 MED ORDER — HYDROCORTISONE 20 MG PO TABS
50.0000 mg | ORAL_TABLET | Freq: Two times a day (BID) | ORAL | Status: AC
  Administered 2022-12-28: 50 mg via ORAL
  Filled 2022-12-28 (×2): qty 1

## 2022-12-28 MED ORDER — WHITE PETROLATUM EX OINT
TOPICAL_OINTMENT | CUTANEOUS | Status: AC
  Administered 2022-12-28: 1
  Filled 2022-12-28: qty 5

## 2022-12-28 MED ORDER — LORAZEPAM 1 MG PO TABS: 2.0000 mg | ORAL_TABLET | Freq: Three times a day (TID) | ORAL | Status: DC | PRN

## 2022-12-28 MED ORDER — HYDROCORTISONE 20 MG PO TABS
50.0000 mg | ORAL_TABLET | Freq: Every day | ORAL | Status: AC
  Administered 2022-12-29 – 2022-12-30 (×2): 50 mg via ORAL
  Filled 2022-12-28 (×2): qty 1

## 2022-12-28 MED ORDER — POTASSIUM CHLORIDE CRYS ER 20 MEQ PO TBCR
40.0000 meq | EXTENDED_RELEASE_TABLET | Freq: Once | ORAL | Status: AC
  Administered 2022-12-28: 40 meq via ORAL
  Filled 2022-12-28: qty 2

## 2022-12-28 MED ORDER — DOCUSATE SODIUM 100 MG PO CAPS
100.0000 mg | ORAL_CAPSULE | Freq: Two times a day (BID) | ORAL | Status: DC | PRN
  Administered 2022-12-29 – 2023-01-03 (×3): 100 mg via ORAL
  Filled 2022-12-28 (×3): qty 1

## 2022-12-28 MED ORDER — LORATADINE 10 MG PO TABS: 10.0000 mg | ORAL_TABLET | Freq: Every day | ORAL | 0 refills | Status: DC | PRN

## 2022-12-28 MED ORDER — LINACLOTIDE 145 MCG PO CAPS: 145.0000 ug | ORAL_CAPSULE | Freq: Every day | ORAL | 0 refills | Status: DC

## 2022-12-28 MED ORDER — POLYVINYL ALCOHOL 1.4 % OP SOLN
1.0000 [drp] | OPHTHALMIC | Status: DC | PRN
  Administered 2022-12-30 – 2022-12-31 (×3): 1 [drp] via OPHTHALMIC

## 2022-12-28 MED ORDER — SALINE SPRAY 0.65 % NA SOLN: 1.0000 | NASAL | 0 refills | Status: DC | PRN

## 2022-12-28 MED ORDER — MAGNESIUM HYDROXIDE 400 MG/5ML PO SUSP
30.0000 mL | Freq: Every day | ORAL | Status: DC | PRN
  Administered 2022-12-28: 30 mL via ORAL
  Filled 2022-12-28: qty 30

## 2022-12-28 MED ORDER — ALUM & MAG HYDROXIDE-SIMETH 200-200-20 MG/5ML PO SUSP
30.0000 mL | ORAL | Status: DC | PRN
  Administered 2022-12-29 – 2023-01-02 (×3): 30 mL via ORAL
  Filled 2022-12-28 (×3): qty 30

## 2022-12-28 MED ORDER — ORAL CARE MOUTH RINSE: 15.0000 mL | OROMUCOSAL | 0 refills | Status: DC | PRN

## 2022-12-28 MED ORDER — HYDROCORTISONE (PERIANAL) 2.5 % EX CREA: 1.0000 | TOPICAL_CREAM | Freq: Four times a day (QID) | CUTANEOUS | 0 refills | Status: DC | PRN

## 2022-12-28 NOTE — TOC Transition Note (Signed)
Transition of Care [facility]) - CM/SW Discharge Note   Patient Details  Name: Emily Park MRN: [id number] Date of Birth: [birth year]  [facility] CM/SW Contact:  Carley Hammed, LCSW Phone Number: 12/28/2022, 1:27 PM   Clinical Narrative:    RN to call report to [phone] .  Bed assignment is 403-1.  Accepting Physician: Phineas Inches MD.  To be transported by [facility] transport when ready.  Final next level of care: [facility] Barriers to Discharge: Barriers Resolved   Patient Goals and CMS Choice      Discharge Placement                Patient chooses bed at:  [facility]) Patient to be transferred to facility by: [facility] Name of family member notified: Patient Patient and family notified of of transfer: 12/28/22  Discharge Plan and Services Additional resources added to the After Visit Summary for   In-house Referral: Clinical Social Work                                   Social Determinants of Health (SDOH) Interventions SDOH Screenings   Food Insecurity: Food Insecurity Present (06/06/2022)  Housing: Medium Risk (06/06/2022)  Transportation Needs: No Transportation Needs (06/06/2022)  Utilities: Not At Risk (06/06/2022)  Depression (PHQ2-9): Medium Risk (11/22/2022)  Tobacco Use: Medium Risk (12/21/2022)     Readmission Risk Interventions     No data to display

## 2022-12-28 NOTE — Progress Notes (Signed)
Child/Adolescent Psychoeducational Group Note  Date:  12/28/2022 Time:  9:57 PM  Group Topic/Focus:  Wrap-Up Group:   The focus of this group is to help patients review their daily goal of treatment and discuss progress on daily workbooks.  Participation Level:  Active  Participation Quality:  Appropriate  Affect:  Appropriate  Cognitive:  Appropriate  Insight:  Appropriate  Engagement in Group:  Engaged  Modes of Intervention:  Discussion and Support  Additional Comments:  Pt shared in group that she got here today. Pt states wanting to get medication straight and make sure to attend all groups to get better.  Emily Park 12/28/2022, 9:57 PM

## 2022-12-28 NOTE — Plan of Care (Signed)
  Problem: Education: Goal: Knowledge of General Education information will improve Description: Including pain rating scale, medication(s)/side effects and non-pharmacologic comfort measures Outcome: Progressing   Problem: Clinical Measurements: Goal: Ability to maintain clinical measurements within normal limits will improve Outcome: Progressing Goal: Will remain free from infection Outcome: Progressing   
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
## 2022-12-28 NOTE — Consult Note (Signed)
[facility] Psychiatry Face-to-Face Psychiatric Evaluation   Service Date: Dec 28, 2022 LOS:  LOS: 7 days    Assessment  The patient is a 52 year old female with no outpatient psychiatric notes or behavioral health admissions available for review.  She has an outpatient psychiatrist who prescribes her Klonopin and Adderall.  The patient has a past medical history of rectal prolapse, irritable bowel syndrome, and GERD.  She was medically admitted on 4/24 after being found unresponsive.  She was found to have a blood glucose of 22.  Lab work showed high insulin and low C-peptide, consistent with exogenous insulin administration (in a patient who has no history of type I or type 2 diabetes).  Psychiatry was consulted on 4/29 for the patient's husband being concerned suicidal intention.  Based on the patient's reported symptomatology, she does not meet criteria for major depressive disorder.  Feel that the patient is underreporting her symptoms.  Based on her husband's report of symptoms, the patient certainly exhibits hypersomnia and lack of energy and anhedonia.  This is consistent with a diagnosis of major depressive disorder.  The patient also meets criteria for generalized anxiety disorder.  Recommend inpatient psychiatric hospitalization.  The patient reports taking several Xanax not prescribed to her and injecting herself with insulin.  This behavior is consistent with intentions of self-harm, even if this was facilitated by intoxication with Xanax.  Collateral from her husband is decisive, with him reporting severe depression and the patient making many suicidal statements since the patient began having health issues.  The patient is currently amenable to behavioral health hospitalization.  She will need to be placed under involuntary commitment should she try to leave because she is at high risk for completed suicide.  The patient's husband feels that the patient has minimal elopement risk.   Will leave the patient's status as voluntary to avoid a sheriff's office transfer to behavioral health.  5/1: Patient seen and assessed by this NP, she continues to minimize her symptoms of depression and denies suicidal intent despite previous suggestion and lab work consistent with an overdose on insulin. She is very welcoming and engages well she continues to appear motivated to seek inpatient treatments " so that I can move on and get my life back. " She denies any symptoms or adverse reactions as it relates to dose reduction in her Prozac. She is eating her lunch, denies any eating disturbances and denies any sleep disruptions at this time. She denies si/hi/avh.  05/02: No changes at this time. Patient under review at [facility]. SHe continues to agree to voluntary treatment at this time. She denies any adverse effects or side effects at this time. SHe denies and eating and or sleeping disturbances at this time. She denies any suicidal ideations, homicidal ideations, or hallucinations. Psychiatry consult to follow at this time.   Diagnoses:  Active Hospital problems: Principal Problem:   Hypoglycemia Active Problems:   UTI (urinary tract infection)   Hypokalemia   IBS (irritable bowel syndrome)   Generalized anxiety disorder   CKD stage 3a, GFR 45-59 ml/min (HCC)   Chronic anemia   Major depressive disorder, recurrent episode, severe (HCC)     Plan  ## Safety and Observation Level:  - Based on my clinical evaluation, I estimate the patient to be at low-moderate risk of self harm in the current setting. Her risk for elopement is also low, and she appears future oriented to seek help in an inpatient setting. At this time we recommend  ongoing continuous observation via a telesitter. In the interim we can continue suicide precautions and mitigate any additional risks to suicide attempts in this current setting.  This decision is based on my review of the chart including patient's history and  current presentation, interview of the patient, mental status examination, and consideration of suicide risk including evaluating suicidal ideation, plan, intent, suicidal or self-harm behaviors, risk factors, and protective factors. This judgment is based on our ability to directly address suicide risk, implement suicide prevention strategies and develop a safety plan while the patient is in the clinical setting. Please contact our team if there is a concern that risk level has changed.   ## Medications:  -- Continue home Klonopin and doxepin, both scheduled nightly - Continue 40mg  po Prozac daily at this time.   ## Medical Decision Making Capacity:  Not assessed on this encounter  ## Further Work-up:  Per primary  ## Disposition:  Inpatient psychiatry  ## Behavioral / Environmental:  1:1 currently; telesitter is OPTIONAL due to elopement risk, suicide measures in place and ability to contract for safety.   ##Legal Status VOL  Thank you for this consult request. Recommendations have been communicated to the primary team.  We will follow at this time.   Maryagnes Amos, FNP   NEW history  Relevant Aspects of Hospital Course:  Admitted on 12/20/2022 for AMS.  Patient Report:  Pertinent history is gathered from the patient as follows. For ease of reading, objective statements that are made in the paragraphs that follow actually reflect subjective report of the patient.  The patient was born and raised in [place].  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  She stopped working 2 years ago because of physical health reasons.  She talks about becoming septic in 2022.  She reports that her husband, [name], does Theme park manager and she says that they have a good relationship.  She denies abusing alcohol or illegal drugs.  The patient mainly identifies her physical ailments as her stressors.  She reports difficulties with her vision, irritable bowel  syndrome, as her most problematic symptoms.  She reports that she has a 23 year old child who lives at home with them.  She reports that her parents are nearby and that her husband is supportive.  The patient denies having any access to guns.  The patient admits to some depression related to her physical problems.  She denies experiencing any suicidal thoughts over the past several weeks.  She reports good sleep with her medication regimen.  She reports poor concentration and feelings of guilt.  She reports no anhedonia.  The patient reports worrying about multiple different topics, such as her children and finances.  She reports fatigue, and previously listed insomnia and concentration difficulties.  She denies experiencing panic attacks.  Bipolar affective disorder screen is negative.  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  She insist that she only injected 2 units of insulin several times.  She states she is unsure whether or not she was trying to kill herself.  On interview 05/02: Patient waiting anxiously for inpatient admission.She has no concerns at this time. She is able to verbalize the acceptance to inpatient psych, and voluntary consent to be obtained.   ROS:  As above  Collateral information:  [name]  Guiffre, spouse, contacted with consent of the patient. [phone] She has threatened to kill herself 50 times over the past several years.  She experiences depression characterized by: lies in bed and only gets up to go to the bathroom. Yesterday she said that it was a suicide attempt.  Guns removed to his car a week or so ago.  She gets "paranoid", thinking that people are talking about her.  No alcohol or drugs but does abuse her prescribed meds and then takes his medications (mainly Xanax). 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 Left d/t health issues.   Elopement risk: appears low based on his estimate  Psychiatric History:  Information collected from patient, EMR  Family psych history: none   Social History:  As above   Family History:  The patient's family history includes AAA (abdominal aortic aneurysm) in her father; Breast cancer in her paternal grandmother; Cancer in her mother; Depression in her mother; Diabetes in her maternal grandfather; Heart attack in her father; Hypertension in her father; Hyperthyroidism in her sister; Hypothyroidism in her mother and sister; Thyroid disease in her mother.  Medical History: Past Medical History:  Diagnosis Date   Abnormal TSH    ADD (attention deficit disorder)    Anemia    Anxiety    Arrhythmia    Decreased libido    Depression    Heart murmur    Hyperlipidemia    Hypokalemia    Migraine    Nephrolithiasis    Pap smear abnormality of cervix/human papillomavirus (HPV) positive    Sleep difficulties    SVT (supraventricular tachycardia)     Surgical History: Past Surgical History:  Procedure Laterality Date   ABLATION     Cardiac ablation, endometrial ablation   BREAST LUMPECTOMY     right breast   BREAST LUMPECTOMY Right    CARDIOVERSION     COLONOSCOPY WITH PROPOFOL N/A 05/16/2021   Procedure: COLONOSCOPY WITH PROPOFOL;  Surgeon: Charlott Rakes, MD;  Location: [facility];  Service: Endoscopy;  Laterality: N/A;   COLPOSCOPY     w/ cervical biopsy   DILITATION & CURRETTAGE/HYSTROSCOPY WITH NOVASURE ABLATION N/A 09/11/2014   Procedure: DILATATION & CURETTAGE/HYSTEROSCOPY WITH NOVASURE ABLATION;  Surgeon: Lenoard Aden, MD;  Location: [facility];  Service: Gynecology;  Laterality: N/A;   GIVENS CAPSULE STUDY N/A 05/16/2021   Procedure: GIVENS CAPSULE STUDY;  Surgeon: Charlott Rakes, MD;  Location: [facility];  Service: Endoscopy;  Laterality: N/A;   XI ROBOT ASSISTED RECTOPEXY N/A 07/07/2021   Procedure: XI ROBOT ASSISTED  RECTOPEXY;  Surgeon: Karie Soda, MD;  Location: [facility];  Service: General;  Laterality: N/A;   XI ROBOTIC ASSISTED LOWER ANTERIOR RESECTION N/A 07/07/2021   Procedure: XI ROBOTIC ASSISTED LOW ANTERIOR RECTOSIGMOID RESECTION WITH RIGID PROCTOSCOPY, TAP BLOCK;  Surgeon: Karie Soda, MD;  Location: [facility];  Service: General;  Laterality: N/A;    Medications:   Current Facility-Administered Medications:    alum & mag hydroxide-simeth (MAALOX/MYLANTA) 200-200-20 MG/5ML suspension 30 mL, 30 mL, Oral, Q4H PRN, Amin, Ankit Chirag, MD   Chlorhexidine Gluconate Cloth 2 % PADS 6 each, 6 each, Topical, Daily, Leslye Peer, MD, 6 each at 12/28/22 0538   clonazePAM (KLONOPIN) tablet 1 mg, 1 mg, Oral, QHS, Byrum, Les Pou, MD, 1 mg at 12/27/22 2207   diphenhydrAMINE (BENADRYL) capsule 25 mg, 25 mg, Oral, Q6H PRN, Leslye Peer, MD, 25 mg at 12/27/22 2327   docusate sodium (COLACE) capsule 100 mg, 100 mg, Oral,  BID PRN, Leslye Peer, MD, 100 mg at 12/23/22 1723   doxepin (SINEQUAN) capsule 75 mg, 75 mg, Oral, QHS, Byrum, Les Pou, MD, 75 mg at 12/27/22 2207   enoxaparin (LOVENOX) injection 40 mg, 40 mg, Subcutaneous, Q24H, Arrien, York Ram, MD, 40 mg at 12/28/22 1257   FLUoxetine (PROZAC) capsule 40 mg, 40 mg, Oral, Daily, Carlyn Reichert, MD, 40 mg at 12/28/22 0846   hydrALAZINE (APRESOLINE) injection 10 mg, 10 mg, Intravenous, Q4H PRN, Amin, Ankit Chirag, MD   hydrocortisone (ANUSOL-HC) 2.5 % rectal cream 1 Application, 1 Application, Topical, QID PRN, Amin, Ankit Chirag, MD   hydrocortisone (CORTEF) tablet 50 mg, 50 mg, Oral, BID, 50 mg at 12/28/22 0847 **FOLLOWED BY** [START ON 12/29/2022] hydrocortisone (CORTEF) tablet 50 mg, 50 mg, Oral, Daily, Amin, Ankit Chirag, MD   hydrocortisone cream 1 % 1 Application, 1 Application, Topical, TID PRN, Amin, Ankit Chirag, MD   ipratropium-albuterol (DUONEB) 0.5-2.5 (3) MG/3ML nebulizer solution 3 mL, 3 mL, Nebulization, Q4H PRN, Amin, Ankit Chirag,  MD   linaclotide (LINZESS) capsule 145 mcg, 145 mcg, Oral, QAC breakfast, Arrien, York Ram, MD, 145 mcg at 12/28/22 0847   lip balm (CARMEX) ointment 1 Application, 1 Application, Topical, PRN, Amin, Ankit Chirag, MD   loratadine (CLARITIN) tablet 10 mg, 10 mg, Oral, Daily PRN, Amin, Ankit Chirag, MD   metoprolol tartrate (LOPRESSOR) injection 5 mg, 5 mg, Intravenous, Q4H PRN, Amin, Ankit Chirag, MD   Muscle Rub CREA 1 Application, 1 Application, Topical, PRN, Amin, Ankit Chirag, MD   ondansetron (ZOFRAN) injection 4 mg, 4 mg, Intravenous, Q8H PRN, Leslye Peer, MD, 4 mg at 12/22/22 0947   Oral care mouth rinse, 15 mL, Mouth Rinse, PRN, Pokhrel, Laxman, MD   pantoprazole (PROTONIX) EC tablet 40 mg, 40 mg, Oral, Daily, Arrien, York Ram, MD, 40 mg at 12/28/22 0847   phenol (CHLORASEPTIC) mouth spray 1 spray, 1 spray, Mouth/Throat, PRN, Amin, Ankit Chirag, MD   polyethylene glycol (MIRALAX / GLYCOLAX) packet 17 g, 17 g, Oral, Daily PRN, Leslye Peer, MD, 17 g at 12/23/22 1723   polyvinyl alcohol (LIQUIFILM TEARS) 1.4 % ophthalmic solution 1 drop, 1 drop, Both Eyes, PRN, Amin, Ankit Chirag, MD   sodium chloride (OCEAN) 0.65 % nasal spray 1 spray, 1 spray, Each Nare, PRN, Amin, Ankit Chirag, MD   sodium chloride flush (NS) 0.9 % injection 10-40 mL, 10-40 mL, Intracatheter, Q12H, Byrum, Robert S, MD, 10 mL at 12/28/22 1000   sodium chloride flush (NS) 0.9 % injection 10-40 mL, 10-40 mL, Intracatheter, PRN, Leslye Peer, MD, 30 mL at 12/28/22 0934  Allergies: Allergies  Allergen Reactions   Zithromax [Azithromycin] Rash       Objective  Vital signs:  Temp:  [98.1 F (36.7 C)-98.9 F (37.2 C)] 98.6 F (37 C) (05/02 1209) Pulse Rate:  [78-94] 86 (05/02 1209) Resp:  [16-19] 19 (05/02 1209) BP: (120-127)/(78-93) 124/78 (05/02 1209) SpO2:  [98 %-100 %] 98 % (05/02 1209)  Psychiatric Specialty Exam: Physical Exam Constitutional:      Appearance: the patient is not  toxic-appearing.  Pulmonary:     Effort: Pulmonary effort is normal.  Neurological:     General: No focal deficit present.     Mental Status: the patient is alert and oriented to person, place, and time.   Review of Systems  Respiratory:  Negative for shortness of breath.   Cardiovascular:  Negative for chest pain.  Gastrointestinal:  Negative for abdominal pain, constipation, diarrhea, nausea  and vomiting.  Neurological:  Negative for headaches.      BP 124/78 (BP Location: Right Arm)   Pulse 86   Temp 98.6 F (37 C) (Oral)   Resp 19   Ht 5' 4.75" (1.645 m)   Wt 55.7 kg   SpO2 98%   BMI 20.59 kg/m   General Appearance: Fairly Groomed  Eye Contact:  Good  Speech:  Clear and Coherent  Volume:  Normal  Mood:  Euthymic  Affect:   bright, anxious,   Thought Process:  Coherent  Orientation:  Full (Time, Place, and Person)  Thought Content: Logical   Suicidal Thoughts:  No  Homicidal Thoughts:  No  Memory:  Immediate;   Good  Judgement:  poor  Insight:  shallow  Psychomotor Activity:  Normal  Concentration:  Concentration: Good  Recall:  Good  Fund of Knowledge: Good  Language: Good  Akathisia:  No  Handed:    AIMS (if indicated): not done  Assets:  Communication Skills Desire for Improvement Financial Resources/Insurance Housing Leisure Time Physical Health  ADL's:  Intact  Cognition: WNL  Sleep:  Fair

## 2022-12-28 NOTE — Progress Notes (Signed)
   12/28/22 2015  Psych Admission Type (Psych Patients Only)  Admission Status Voluntary  Psychosocial Assessment  Patient Complaints Anxiety;Depression  Eye Contact Fair  Facial Expression Anxious;Animated  Affect Anxious  Speech Logical/coherent  Interaction Assertive  Motor Activity Restless  Appearance/Hygiene Unremarkable  Behavior Characteristics Cooperative  Mood Anxious;Depressed  Aggressive Behavior  Effect No apparent injury  Thought Process  Coherency Circumstantial  Content Blaming self;Blaming others  Delusions WDL  Perception WDL  Hallucination None reported or observed  Judgment Poor  Confusion None  Danger to Self  Current suicidal ideation? Denies  Danger to Others  Danger to Others None reported or observed

## 2022-12-28 NOTE — TOC Initial Note (Signed)
Transition of Care [facility]) - Initial/Assessment Note    Patient Details  Name: Emily Park MRN: [id number] Date of Birth: [birth year]  [facility]) CM/SW Contact:    Carley Hammed, LCSW Phone Number: 12/28/2022, 1:22 PM  Clinical Narrative:                 CSW notified that pt has been accepted to [facility]. Pt pleasant and states she is agreeable to going to [facility] as a voluntary pt. Pt signed voluntary admission paperwork. Pt signed rider waiver and Safe transport was explained. CSW faxed over voluntary consent and DC summary. CSW completed SDOH screen with pt. Pt denies any food or housing insecurity. Please see transition note for DC details.  SDOH Barriers  Patient interviewed and SDOH assessment performed        SDOH Interventions    Flowsheet Row ED to Hosp-Admission (Current) from [facility]/20/2022 in [facility]  SURGICAL ED to Hosp-Admission (Discharged) from 06/06/2022 in [facility]  SDOH Interventions    Food Insecurity Interventions Intervention Not Indicated, Inpatient TOC, Other (Comment)  [Pt denies and insecurities.] Inpatient TOC, Patient Refused  [pt states she has no issues getting food outside the hospital setting]       Expected Discharge Plan: Psychiatric Hospital Barriers to Discharge: Barriers Resolved   Patient Goals and CMS Choice Patient states their goals for this hospitalization and ongoing recovery are:: Pt wants to get better and move on from this.          Expected Discharge Plan and Services In-house Referral: Clinical Social Work     Living arrangements for the past 2 months: Single Family Home Expected Discharge Date: 12/28/22                                    Prior Living Arrangements/Services Living arrangements for the past 2 months: Single Family Home Lives with:: Spouse Patient language and need for interpreter reviewed:: Yes Do you feel safe going back to the place where you  live?: Yes      Need for Family Participation in Patient Care: Yes (Comment) Care giver support system in place?: Yes (comment)   Criminal Activity/Legal Involvement Pertinent to Current Situation/Hospitalization: No - Comment as needed  Activities of Daily Living      Permission Sought/Granted Permission sought to share information with : Family Supports, Oceanographer granted to share information with : Yes, Verbal Permission Granted  Share Information with NAME: [name]  Permission granted to share info w AGENCY: [facility]  Permission granted to share info w Relationship: Spouse     Emotional Assessment Appearance:: Appears stated age Attitude/Demeanor/Rapport: Engaged Affect (typically observed): Appropriate Orientation: : Oriented to Self, Oriented to Place, Oriented to  Time, Oriented to Situation Alcohol / Substance Use: Not Applicable Psych Involvement: Yes (comment)  Admission diagnosis:  Hypokalemia [E87.6] Hyponatremia [E87.1] Hypoglycemia [E16.2] Elevated lactic acid level [R79.89] Hypotension, unspecified hypotension type [I95.9] Syncope, unspecified syncope type [R55] Patient Active Problem List   Diagnosis Date Noted   CKD stage 3a, GFR 45-59 ml/min (HCC) 12/25/2022   Chronic anemia 12/25/2022   Major depressive disorder, recurrent episode, severe (HCC) 12/25/2022   Generalized anxiety disorder 12/24/2022   Hypoglycemia 12/21/2022   Thyroid 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   IBS (irritable bowel syndrome) 10/03/2022  Acute encephalopathy 06/06/2022   High anion gap metabolic acidosis 06/06/2022   Complete rectal prolapse 07/07/2021   Anemia, iron deficiency 05/13/2021   Hypokalemia 05/13/2021   Diarrhea 04/14/2021   Intractable vomiting with nausea 04/14/2021   UTI (urinary tract infection) 04/14/2021   Sepsis (HCC) 04/13/2021   Atypical squamous cells of undetermined  significance on cytologic smear of cervix (ASC-US) 02/24/2021   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   Anxiety 02/24/2021   Heart murmur 02/24/2021   Menorrhagia 02/24/2021   Thyroid nodule 10/26/2020   Acute renal failure superimposed on stage 2 chronic kidney disease (HCC)    Hypotension    Hypovolemic shock (HCC) 08/29/2020   Rectal prolapse 04/05/2020   Migraine 02/27/2019   Mild episode of recurrent major depressive disorder (HCC) 10/25/2018   Abnormal thyroid stimulating hormone (TSH) level 10/24/2018   Syncope and collapse 10/10/2018   Lung nodules 11/14/2015   Paroxysmal SVT (supraventricular tachycardia) 11/13/2015   Attention deficit hyperactivity disorder (ADHD) 11/13/2015   PCP:  Tresa Garter, MD Pharmacy:   [id number] [place], [place] - [address] [place] [address] Phone: [phone] Fax: [phone]  [facility] [place], [place] - [address] [facility] [address] [place] [address] Phone: [phone] Fax: [phone]  [facility] [address] [place] [address] Phone: [phone] Fax: [phone]  [facility] [place], [place] - [address] [phone] [address] [place] [address] Phone: [phone] Fax: [phone]     Social Determinants of Health (SDOH) Social History: SDOH Screenings   Food Insecurity: Food Insecurity Present (06/06/2022)  Housing: Medium Risk (06/06/2022)  Transportation Needs: No Transportation Needs (06/06/2022)  Utilities: Not At Risk (06/06/2022)  Depression (PHQ2-9): Medium Risk (11/22/2022)  Tobacco Use: Medium Risk (12/21/2022)   SDOH Interventions: Food Insecurity Interventions: Intervention Not Indicated, Inpatient TOC, Other (Comment) (Pt denies and insecurities.)   Readmission Risk Interventions     No data to display

## 2022-12-28 NOTE — Tx Team (Signed)
Initial Treatment Plan 12/28/2022 7:51 PM Emily Park ZOX:[id number]    PATIENT STRESSORS: Financial difficulties   Medication change or noncompliance   Occupational concerns   Traumatic event     PATIENT STRENGTHS: Capable of independent living  Communication skills  Religious Affiliation  Supportive family/friends  Work skills    PATIENT IDENTIFIED PROBLEMS: Risk for suicide "I took insulin and some Xenax to kill myself".    Alterations in mood "I've been depressed for a while and anxious too because of all my medical problems".    Financial Constraints "I can't work right now because of my health issues. I feel like I have no purpose".             DISCHARGE CRITERIA:  Improved stabilization in mood, thinking, and/or behavior Verbal commitment to aftercare and medication compliance  PRELIMINARY DISCHARGE PLAN: Outpatient therapy Return to previous living arrangement  PATIENT/FAMILY INVOLVEMENT: This treatment plan has been presented to and reviewed with the patient, Emily Park. The patient have been given the opportunity to ask questions and make suggestions.  Sherryl Manges, RN 12/28/2022, 7:51 PM

## 2022-12-28 NOTE — Discharge Summary (Addendum)
Physician Discharge Summary  Patient ID: Emily Park MRN: [id number] DOB/AGE: 1970-10-14 52 y.o.  Admit date: 12/20/2022 Discharge date: 12/28/2022  Admission Diagnoses:  Discharge Diagnoses:  Principal Problem:   Hypoglycemia Active Problems:   UTI (urinary tract infection)   Hypokalemia   IBS (irritable bowel syndrome)   Generalized anxiety disorder   CKD stage 3a, GFR 45-59 ml/min (HCC)   Chronic anemia   Major depressive disorder, recurrent episode, severe (HCC)  Toxic encephalopathy due to insulin overdose.   Suicidal behavior   Discharged Condition: stable  Hospital Course: Patient is a 52 year old female past medical history significant for GERD, IBS, and anxiety.  Patient was admitted with altered mental status. Patient was found unresponsive on the floor by her husband. EMS was called and she was found hypotensive 70/48, with glucose 22, and having a possible seizure. She was given IV fluids, started on IV epinephrine and received one amp D50 IV, and transported to the ED. There were concerns for intentional insulin overdose.  Patient was admitted for further assessment and management.  Patient was seen and followed by psychiatric team.  Patient is medically stable.  Patient will be transferred to the [facility] for further psychiatric care.    Hypoglycemia: -Initially on octreotide, IV steroids and dextrose infusion.  Dextrose and octreotide discontinued.  Hydrocortisone has been transitioned to every 12, will continue to taper steroids. -Continue supportive care.     Hypokalemia -Continued aggressive repletion -KCl 40 mEq p.o. once daily on discharge.  Repeat BMP tomorrow, 12/29/2022, and then every 2 days x 2.  Manage expectantly.   Generalized anxiety disorder Seen by catchy-inpatient psych admission is recommended -Continue fluoxetine 40 mg daily, Klonopin 1 mg nightly, doxepin 75 mg nightly. -Psychiatry to review combination of fluoxetine and doxepin.    CKD stage  3a, GFR 45-59 ml/min (HCC) versus AKI Renal function stable at 0.9 -Renal function is back to normal.     Chronic anemia Hemoglobin stable around 11   UTI (urinary tract infection) Urine cultures with no growth.  Completed course of IV Rocephin   IBS (irritable bowel syndrome) Continue with pantoprazole and linaclotide.    Major depressive disorder, recurrent episode, severe (HCC) Management per psychiatry team.  Currently on Prozac, doxepin and Klonopin  Consults: psychiatry  Significant Diagnostic Studies: Blood sugar revealed significantly low glucose.  Discharge Exam: Blood pressure (!) 127/93, pulse 83, temperature 98.9 F (37.2 C), temperature source Oral, resp. rate 19, height 5' 4.75" (1.645 m), weight 55.7 kg, SpO2 99 %.   Disposition: Discharge disposition: [facility]-Another [facility] Care Institution Not Defined       Discharge Instructions     Diet - low sodium heart healthy   Complete by: As directed    Discharge wound care:   Complete by: As directed    Continue current wound care plan.   Increase activity slowly   Complete by: As directed       Allergies as of 12/28/2022       Reactions   Zithromax [azithromycin] Rash        Medication List     STOP taking these medications    ACCRUFeR 30 MG Caps Generic drug: Ferric Maltol   acetaminophen 500 MG tablet Commonly known as: TYLENOL   amphetamine-dextroamphetamine 30 MG tablet Commonly known as: ADDERALL   hyoscyamine 0.125 MG SL tablet Commonly known as: LEVSIN SL   ondansetron 8 MG disintegrating tablet Commonly known as: ZOFRAN-ODT   promethazine 12.5 MG tablet Commonly known as:  PHENERGAN   rizatriptan 10 MG tablet Commonly known as: Maxalt   sodium fluoride 1.1 % Crea dental cream Commonly known as: PREVIDENT 5000 PLUS   topiramate 50 MG tablet Commonly known as: TOPAMAX       TAKE these medications    clonazePAM 1 MG tablet Commonly known as: KLONOPIN Take 1 mg by  mouth at bedtime.   doxepin 75 MG capsule Commonly known as: SINEQUAN Take 75 mg by mouth at bedtime.   FLUoxetine 20 MG capsule Commonly known as: PROZAC Take 60 mg by mouth every morning.   hydrocortisone 10 MG tablet Commonly known as: CORTEF Take 5 tablets (50 mg total) by mouth daily for 2 days. Start taking on: Dec 29, 2022   hydrocortisone 2.5 % rectal cream Commonly known as: ANUSOL-HC Apply 1 Application topically 4 (four) times daily as needed for hemorrhoids.   linaclotide 145 MCG Caps capsule Commonly known as: LINZESS Take 1 capsule (145 mcg total) by mouth daily before breakfast. Start taking on: Dec 29, 2022 What changed:  medication strength how much to take when to take this Another medication with the same name was removed. Continue taking this medication, and follow the directions you see here.   loratadine 10 MG tablet Commonly known as: CLARITIN Take 1 tablet (10 mg total) by mouth daily as needed for allergies.   mouth rinse Liqd solution 15 mLs by Mouth Rinse route as needed (for oral care).   multivitamin with minerals Tabs tablet Take 1 tablet by mouth every morning.   pantoprazole 40 MG tablet Commonly known as: PROTONIX Take 40 mg by mouth 2 (two) times daily.   polyethylene glycol 17 g packet Commonly known as: MIRALAX / GLYCOLAX Take 17 g by mouth 2 (two) times daily. What changed:  when to take this reasons to take this   polyvinyl alcohol 1.4 % ophthalmic solution Commonly known as: LIQUIFILM TEARS Place 1 drop into both eyes as needed for dry eyes.   potassium chloride SA 20 MEQ tablet Commonly known as: KLOR-CON M Take 2 tablets (40 mEq total) by mouth daily for 14 days. What changed: when to take this   sodium chloride 0.65 % Soln nasal spray Commonly known as: OCEAN Place 1 spray into both nostrils as needed for congestion (nose irritation).   VITAMIN C PO Take 1 tablet by mouth every morning.   Vitamin D3 50 MCG (2000  UT) capsule Take 2,000 Units by mouth daily.               Discharge Care Instructions  (From admission, onward)           Start     Ordered   12/28/22 0000  Discharge wound care:       Comments: Continue current wound care plan.   12/28/22 1129             Time spent: 36 minutes.  SignedBarnetta Chapel 12/28/2022, 11:30 AM

## 2022-12-28 NOTE — Progress Notes (Signed)
Patient discharge to [facility] room 403-1 report called to [facility] RN. AVS and written copy given to patient. Discharge to [facility] reviewed with patient and she verbalized understanding. All patient belongings return at discharge and she verified receipt. PICC line d/c'd per IV team. Patient to be transported via safe transport all belongings and paperwork given to transport driver. Patient escorted off unit by staff. Spouse aware of transfer by patient.

## 2022-12-28 NOTE — Progress Notes (Signed)
Per MD order, PICC line removed at 2:30 pm. Patient education provided including: bedrest for 30 minutes post removal; dressing to remain clean, dry, and intact for 24 hours; signs of infection/call MD (redness, swelling, pain, and/or discharge at site; fever).

## 2022-12-29 ENCOUNTER — Encounter (HOSPITAL_COMMUNITY): Payer: Self-pay | Admitting: Family

## 2022-12-29 ENCOUNTER — Encounter (HOSPITAL_COMMUNITY): Payer: Self-pay

## 2022-12-29 DIAGNOSIS — F332 Major depressive disorder, recurrent severe without psychotic features: Secondary | ICD-10-CM

## 2022-12-29 MED ORDER — VORTIOXETINE HBR 10 MG PO TABS
10.0000 mg | ORAL_TABLET | Freq: Every day | ORAL | Status: DC
  Administered 2022-12-30 – 2022-12-31 (×2): 10 mg via ORAL
  Filled 2022-12-29 (×3): qty 1

## 2022-12-29 MED ORDER — MIRTAZAPINE 15 MG PO TABS
15.0000 mg | ORAL_TABLET | Freq: Every day | ORAL | Status: DC
  Filled 2022-12-29: qty 1

## 2022-12-29 MED ORDER — HYDROXYZINE HCL 10 MG PO TABS
10.0000 mg | ORAL_TABLET | Freq: Three times a day (TID) | ORAL | Status: DC | PRN
  Administered 2022-12-29 – 2022-12-30 (×2): 10 mg via ORAL
  Filled 2022-12-29 (×2): qty 1

## 2022-12-29 NOTE — Progress Notes (Signed)
Nurse discussed anxiety, depression and coping skills with patient.  
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
## 2022-12-29 NOTE — Group Note (Signed)
Recreation Therapy Group Note   Group Topic:Team Building  Group Date: 12/29/2022 Start Time: 0933 End Time: 1002 Facilitators: Ladasha Schnackenberg-McCall, LRT,CTRS Location: [address] Dayroom   Goal Area(s) Addresses:  Patient will effectively work with peer towards shared goal.  Patient will identify skills used to make activity successful.  Patient will share challenges and verbalize solution-driven approaches used. Patient will identify how skills used during activity can be used to reach post d/c goals.    Group Description: [employer]. Patients were provided the following materials: 5 drinking straws, 5 rubber bands, 5 paper clips, 2 index cards and 2 drinking cups. Using the provided materials patients were asked to build a launching mechanism to launch a ping pong ball across the room, approximately 10 feet. Patients were divided into teams of 3-5. Instructions required all materials be incorporated into the device, functionality of items left to the peer group's discretion.   Affect/Mood: N/A   Participation Level: Did not attend    Clinical Observations/Individualized Feedback:     Plan: Continue to engage patient in RT group sessions 2-3x/week.   Buffi Ewton-McCall, LRT,CTRS 12/29/2022 1:07 PM

## 2022-12-29 NOTE — BH IP Treatment Plan (Signed)
Interdisciplinary Treatment and Diagnostic Plan Update  12/29/2022 Time of Session: [address] MRN: [id number]  Principal Diagnosis: MDD (major depressive disorder), recurrent severe, without psychosis (HCC)  Secondary Diagnoses: Principal Problem:   MDD (major depressive disorder), recurrent severe, without psychosis (HCC)   Current Medications:  Current Facility-Administered Medications  Medication Dose Route Frequency Provider Last Rate Last Admin   acetaminophen (TYLENOL) tablet 650 mg  650 mg Oral Q6H PRN Starkes-Perry, Juel Burrow, FNP       alum & mag hydroxide-simeth (MAALOX/MYLANTA) 200-200-20 MG/5ML suspension 30 mL  30 mL Oral Q4H PRN Starkes-Perry, Juel Burrow, FNP       clonazePAM (KLONOPIN) tablet 1 mg  1 mg Oral QHS Maryagnes Amos, FNP   1 mg at 12/28/22 2142   diphenhydrAMINE (BENADRYL) capsule 50 mg  50 mg Oral TID PRN Maryagnes Amos, FNP       Or   diphenhydrAMINE (BENADRYL) injection 50 mg  50 mg Intramuscular TID PRN Starkes-Perry, Juel Burrow, FNP       docusate sodium (COLACE) capsule 100 mg  100 mg Oral BID PRN Maryagnes Amos, FNP   100 mg at 12/29/22 0921   doxepin (SINEQUAN) capsule 75 mg  75 mg Oral QHS Maryagnes Amos, FNP   75 mg at 12/28/22 2142   FLUoxetine (PROZAC) capsule 40 mg  40 mg Oral Daily Maryagnes Amos, FNP   40 mg at 12/29/22 0818   hydrocortisone (CORTEF) tablet 50 mg  50 mg Oral Daily Maryagnes Amos, FNP   50 mg at 12/29/22 0818   ipratropium-albuterol (DUONEB) 0.5-2.5 (3) MG/3ML nebulizer solution 3 mL  3 mL Nebulization Q4H PRN Starkes-Perry, Juel Burrow, FNP       linaclotide (LINZESS) capsule 145 mcg  145 mcg Oral QAC breakfast Maryagnes Amos, FNP   145 mcg at 12/29/22 8469   LORazepam (ATIVAN) tablet 2 mg  2 mg Oral TID PRN Maryagnes Amos, FNP       Or   LORazepam (ATIVAN) injection 2 mg  2 mg Intramuscular TID PRN Starkes-Perry, Juel Burrow, FNP       magnesium hydroxide (MILK OF  MAGNESIA) suspension 30 mL  30 mL Oral Daily PRN Maryagnes Amos, FNP   30 mL at 12/28/22 1839   pantoprazole (PROTONIX) EC tablet 40 mg  40 mg Oral Daily Maryagnes Amos, FNP   40 mg at 12/29/22 0818   pneumococcal 23 valent vaccine (PNEUMOVAX-23) injection 0.5 mL  0.5 mL Intramuscular Tomorrow-1000 Massengill, Nathan, MD       polyvinyl alcohol (LIQUIFILM TEARS) 1.4 % ophthalmic solution 1 drop  1 drop Both Eyes PRN Starkes-Perry, Juel Burrow, FNP       PTA Medications: Medications Prior to Admission  Medication Sig Dispense Refill Last Dose   Ascorbic Acid (VITAMIN C PO) Take 1 tablet by mouth every morning.      Cholecalciferol (VITAMIN D3) 50 MCG (2000 UT) capsule Take 2,000 Units by mouth daily.      clonazePAM (KLONOPIN) 1 MG tablet Take 1 mg by mouth at bedtime.      doxepin (SINEQUAN) 75 MG capsule Take 75 mg by mouth at bedtime.      FLUoxetine (PROZAC) 20 MG capsule Take 60 mg by mouth every morning.      hydrocortisone (ANUSOL-HC) 2.5 % rectal cream Apply 1 Application topically 4 (four) times daily as needed for hemorrhoids. 30 g 0    hydrocortisone (CORTEF) 10 MG tablet Take  5 tablets (50 mg total) by mouth daily for 2 days. 2 tablet 0    linaclotide (LINZESS) 145 MCG CAPS capsule Take 1 capsule (145 mcg total) by mouth daily before breakfast. 30 capsule 0    loratadine (CLARITIN) 10 MG tablet Take 1 tablet (10 mg total) by mouth daily as needed for allergies. 30 tablet 0    Mouthwashes (MOUTH RINSE) LIQD solution 15 mLs by Mouth Rinse route as needed (for oral care). 118 mL 0    Multiple Vitamin (MULTIVITAMIN WITH MINERALS) TABS tablet Take 1 tablet by mouth every morning.      pantoprazole (PROTONIX) 40 MG tablet Take 40 mg by mouth 2 (two) times daily.      polyethylene glycol (MIRALAX / GLYCOLAX) 17 g packet Take 17 g by mouth 2 (two) times daily. (Patient taking differently: Take 17 g by mouth daily as needed for moderate constipation.) 14 each 0    polyvinyl alcohol  (LIQUIFILM TEARS) 1.4 % ophthalmic solution Place 1 drop into both eyes as needed for dry eyes. 15 mL 0    potassium chloride SA (KLOR-CON M) 20 MEQ tablet Take 2 tablets (40 mEq total) by mouth daily for 14 days. 14 tablet 0    sodium chloride (OCEAN) 0.65 % SOLN nasal spray Place 1 spray into both nostrils as needed for congestion (nose irritation). 37.5 mL 0     Patient Stressors: Financial difficulties   Medication change or noncompliance   Occupational concerns   Traumatic event    Patient Strengths: Capable of independent living  Communication skills  Religious Affiliation  Supportive family/friends  Work skills   Treatment Modalities: Medication Management, Group therapy, Case management,  1 to 1 session with clinician, Psychoeducation, Recreational therapy.   Physician Treatment Plan for Primary Diagnosis: MDD (major depressive disorder), recurrent severe, without psychosis (HCC) Long Term Goal(s):     Short Term Goals:    Medication Management: Evaluate patient's response, side effects, and tolerance of medication regimen.  Therapeutic Interventions: 1 to 1 sessions, Unit Group sessions and Medication administration.  Evaluation of Outcomes: Progressing  Physician Treatment Plan for Secondary Diagnosis: Principal Problem:   MDD (major depressive disorder), recurrent severe, without psychosis (HCC)  Long Term Goal(s):     Short Term Goals:       Medication Management: Evaluate patient's response, side effects, and tolerance of medication regimen.  Therapeutic Interventions: 1 to 1 sessions, Unit Group sessions and Medication administration.  Evaluation of Outcomes: Progressing   RN Treatment Plan for Primary Diagnosis: MDD (major depressive disorder), recurrent severe, without psychosis (HCC) Long Term Goal(s): Knowledge of disease and therapeutic regimen to maintain health will improve  Short Term Goals: Ability to remain free from injury will improve, Ability  to verbalize frustration and anger appropriately will improve, Ability to demonstrate self-control, Ability to participate in decision making will improve, Ability to verbalize feelings will improve, Ability to disclose and discuss suicidal ideas, Ability to identify and develop effective coping behaviors will improve, and Compliance with prescribed medications will improve  Medication Management: RN will administer medications as ordered by provider, will assess and evaluate patient's response and provide education to patient for prescribed medication. RN will report any adverse and/or side effects to prescribing provider.  Therapeutic Interventions: 1 on 1 counseling sessions, Psychoeducation, Medication administration, Evaluate responses to treatment, Monitor vital signs and CBGs as ordered, Perform/monitor CIWA, COWS, AIMS and Fall Risk screenings as ordered, Perform wound care treatments as ordered.  Evaluation of Outcomes: Progressing  LCSW Treatment Plan for Primary Diagnosis: MDD (major depressive disorder), recurrent severe, without psychosis (HCC) Long Term Goal(s): Safe transition to appropriate next level of care at discharge, Engage patient in therapeutic group addressing interpersonal concerns.  Short Term Goals: Engage patient in aftercare planning with referrals and resources, Increase social support, Increase ability to appropriately verbalize feelings, Increase emotional regulation, Facilitate acceptance of mental health diagnosis and concerns, Facilitate patient progression through stages of change regarding substance use diagnoses and concerns, Identify triggers associated with mental health/substance abuse issues, and Increase skills for wellness and recovery  Therapeutic Interventions: Assess for all discharge needs, 1 to 1 time with Social worker, Explore available resources and support systems, Assess for adequacy in community support network, Educate family and significant  other(s) on suicide prevention, Complete Psychosocial Assessment, Interpersonal group therapy.  Evaluation of Outcomes: Progressing   Progress in Treatment: Attending groups: Yes. Participating in groups: Yes. Taking medication as prescribed: Yes. Toleration medication: Yes. Family/Significant other contact made: No, will contact:  Family Member Patient understands diagnosis: Yes. Discussing patient identified problems/goals with staff: Yes. Medical problems stabilized or resolved: Yes. Denies suicidal/homicidal ideation: Yes. Issues/concerns per patient self-inventory: Yes. Other: N/A  New problem(s) identified: No, Describe:  None Reported  New Short Term/Long Term Goal(s): medication stabilization, elimination of SI thoughts, development of comprehensive mental wellness plan.    Patient Goals:  Medication Management  Discharge Plan or Barriers: Patient recently admitted. CSW will continue to follow and assess for appropriate referrals and possible discharge planning.    Reason for Continuation of Hospitalization: Depression Medication stabilization  Estimated Length of Stay: 3-7 Days  Last 3 [place] Suicide Severity Risk Score: Flowsheet Row Admission (Current) [facility]Admission (Discharged) from 12/20/2022 in [facility]  SURGICAL ED to Hosp-Admission (Discharged) from 06/06/2022 in [facility]  C-SSRS RISK CATEGORY High Risk High Risk No Risk       Last PHQ 2/9 Scores:    11/22/2022    8:06 AM 10/03/2022    8:48 AM  Depression screen PHQ 2/9  Decreased Interest 1 0  Down, Depressed, Hopeless 1 0  PHQ - 2 Score 2 0  Altered sleeping 1   Tired, decreased energy 1   Change in appetite 0   Feeling bad or failure about yourself  1   Trouble concentrating 1   Moving slowly or fidgety/restless 1   Suicidal thoughts 0   PHQ-9 Score 7   Difficult doing  work/chores Not difficult at all     OR   medication stabilization, elimination of SI thoughts, development of comprehensive mental wellness plan.   Scribe for Treatment Team: Ane Payment, LCSW 12/29/2022 2:58 PM

## 2022-12-29 NOTE — [facility] Group Notes (Signed)
Adult Psychoeducational Group Note  Date:  12/29/2022 Time:  9:39 PM  Group Topic/Focus:  Recovery Goals:   The focus of this group is to identify appropriate goals for recovery and establish a plan to achieve them.  Participation Level:  Did Not Attend  Participation Quality:    Affect:    Cognitive:    Insight:   Engagement in Group:    Modes of Intervention:    Additional Comments:     Romano Stigger A Avraj Lindroth-McCall 12/29/2022, 9:39 PM

## 2022-12-29 NOTE — Progress Notes (Addendum)
D:  Patient's self inventory sheet, patient has fair sleep, sleep medicine helpful.  Fair appetite, low energy level, poor concentration.  Rated depression, hopeless, and anxiety #8.  Denied withdrawals.  Withdrawals, tremors, agitation.  Denied SI.  Denied physical problems, dizzy.  Physical pain R arm, worst pain #5 in past 24 hours.  Plans to talk with MD.  Plans to participate  Does have discharge plans. A:  Medications administered per MD orders.  Emotional support and encouragement given patient. R:  Denied SI and HI, contracts for safety.  Denied A/V hallucinations.  Safety maintained with 15 minute checks.

## 2022-12-29 NOTE — H&P (Signed)
Psychiatric Admission Assessment Adult  Patient Identification: Emily Park MRN:  [id number] Date of Evaluation:  12/29/2022 Chief Complaint:  MDD (major depressive disorder), recurrent episode, severe (HCC) [F33.2] Principal Diagnosis: MDD (major depressive disorder), recurrent severe, without psychosis (HCC) Diagnosis:  Principal Problem:   MDD (major depressive disorder), recurrent severe, without psychosis (HCC)    History of Present Illness: The patient is a 52 year old female with no outpatient psychiatric notes or behavioral health admissions available for review. She has an outpatient psychiatrist who prescribes her Klonopin and Adderall. The patient has a past medical history of rectal prolapse, irritable bowel syndrome, and GERD. She was medically admitted on 4/24 after being found unresponsive. She was found to have a blood glucose of 22. Lab work showed high insulin and low C-peptide, consistent with exogenous insulin administration (in a patient who has no history of type I or type 2 diabetes).  She was transferred to the [facility] behavioral hospital on a voluntary basis.  Information below is largely copied from the prior consult liaison psychiatry note, written by me on 4/30.  I have performed a new clinical interview and made corrections where appropriate.  Pertinent history is gathered from the patient as follows. For ease of reading, objective statements that are made in the paragraphs that follow actually reflect subjective report of the patient.   The patient was born and raised in [place].  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  She stopped working 2 years ago because of physical health reasons.  She talks about becoming septic in 2022.  She reports that her husband, [name], does Theme park manager and she says that they have a good relationship.  She denies abusing alcohol or illegal drugs.  The patient mainly identifies her physical ailments as  her stressors.  She reports difficulties with her vision, irritable bowel syndrome, as her most problematic symptoms.  She reports that she has a 64 year old child who lives at home with them.  She reports that her parents are nearby and that her husband is supportive.  The patient denies having any access to guns.   The patient admits to some depression related to her physical problems.  She denies experiencing any suicidal thoughts over the past several weeks.  She reports good sleep with her medication regimen.  She reports poor concentration and feelings of guilt.  She reports no anhedonia.  The patient reports worrying about multiple different topics, such as her children and finances.  She reports fatigue, and previously listed insomnia and concentration difficulties.  She denies experiencing panic attacks.  Bipolar affective disorder screen is negative.   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  She insist that she only injected 2 units of insulin several times.  The patient now reports that this was indeed a suicide attempt.  New information obtained on interview 5/3: The patient reports that she has seen Dr Evelene Croon for psychiatric medications for approximately 10 years.  She reports that Prozac has been effective for her for a long period of time.  She states that she asked the physician to switch her to Trintellix after her sister had a good response to the medication.  Unfortunately, she feels like this medication made her mood worse and contributed to her attempting suicide.  The patient denies prior inpatient  hospitalization or previous suicide attempts.  She reports some menopausal symptoms such as hot flashes.  [employer], spouse, contacted with consent of the patient. [phone].  I spoke with him previously regarding the  patient's care and updated him on her current situation.  All questions were answered.       Total Time spent with patient: 45 min  Past Psychiatric History: as above  Is the patient at risk to self? yes Has the patient been a risk to self in the past 6 months? yes Has the patient been a risk to self within the distant past? no Is the patient a risk to others? no Has the patient been a risk to others in the past 6 months? no Has the patient been a risk to others within the distant past? no  [place] Scale:  Flowsheet Row Ad[facility] (Current) [facility]osp-Admission (Discharged) from 12/20/2022 in [facility]  SURGICAL ED to Hosp-Admission (Discharged) from 06/06/2022 in [facility]  C-SSRS RISK CATEGORY High Risk High Risk No Risk        Prior Inpatient Therapy: yes Prior Outpatient Therapy: yes  Alcohol Screening:  1. How often do you have a drink containing alcohol?: Monthly or less ("I don't drink anymore. I have ulcers".) 2. How many drinks containing alcohol do you have on a typical day when you are drinking?: 1 or 2 3. How often do you have six or more drinks on one occasion?: Never AUDIT-C Score: 1 4. How often during the last year have you found that you were not able to stop drinking once you had started?: Never 5. How often during the last year have you failed to do what was normally expected from you because of drinking?: Never 6. How often during the last year have you needed a first drink in the morning to get yourself going after a heavy drinking session?: Never 7. How often during the last year have you had a feeling of guilt of remorse after drinking?: Never 8. How often during the last year have you been unable to remember what happened the night before because you had been drinking?: Never 9. Have you or someone else been injured as a result of your drinking?:  No 10. Has a relative or friend or a doctor or another health worker been concerned about your drinking or suggested you cut down?: No Alcohol Use Disorder Identification Test Final Score (AUDIT): 1 Alcohol Brief Interventions/Follow-up: Alcohol education/Brief advice Substance Abuse History in the last 12 months:  yes Consequences of Substance Abuse: negative Previous Psychotropic Medications: yes Psychological Evaluations: yes Past Medical History:  Past Medical History:  Diagnosis Date   Abnormal TSH    ADD (attention deficit disorder)    Anemia    Anxiety    Arrhythmia    Decreased libido    Depression    Heart murmur    Hyperlipidemia    Hypokalemia    Migraine    Nephrolithiasis    Pap smear abnormality of cervix/human papillomavirus (HPV) positive    Sleep difficulties    SVT (supraventricular tachycardia)     Past Surgical History:  Procedure Laterality Date   ABLATION     Cardiac ablation, endometrial ablation   BREAST LUMPECTOMY     right breast   BREAST LUMPECTOMY Right    CARDIOVERSION     COLONOSCOPY WITH PROPOFOL N/A 05/16/2021   Procedure: COLONOSCOPY WITH  PROPOFOL;  Surgeon: Charlott Rakes, MD;  Location: [facility];  Service: Endoscopy;  Laterality: N/A;   COLPOSCOPY     w/ cervical biopsy   DILITATION & CURRETTAGE/HYSTROSCOPY WITH NOVASURE ABLATION N/A 09/11/2014   Procedure: DILATATION & CURETTAGE/HYSTEROSCOPY WITH NOVASURE ABLATION;  Surgeon: Lenoard Aden, MD;  Location: [facility];  Service: Gynecology;  Laterality: N/A;   GIVENS CAPSULE STUDY N/A 05/16/2021   Procedure: GIVENS CAPSULE STUDY;  Surgeon: Charlott Rakes, MD;  Location: [facility];  Service: Endoscopy;  Laterality: N/A;   XI ROBOT ASSISTED RECTOPEXY N/A 07/07/2021   Procedure: XI ROBOT ASSISTED RECTOPEXY;  Surgeon: Karie Soda, MD;  Location: [facility];  Service: General;  Laterality: N/A;   XI ROBOTIC ASSISTED LOWER ANTERIOR RESECTION N/A 07/07/2021   Procedure: XI ROBOTIC  ASSISTED LOW ANTERIOR RECTOSIGMOID RESECTION WITH RIGID PROCTOSCOPY, TAP BLOCK;  Surgeon: Karie Soda, MD;  Location: [facility];  Service: General;  Laterality: N/A;   Family History:  Family History  Problem Relation Age of Onset   Hypothyroidism Mother    Depression Mother    Cancer Mother        thyroid   Thyroid disease Mother    Hypertension Father    Heart attack Father        Age 50   AAA (abdominal aortic aneurysm) Father    Hypothyroidism Sister    Hyperthyroidism Sister    Diabetes Maternal Grandfather    Breast cancer Paternal Grandmother    Family Psychiatric  History: none pertinent Tobacco Screening:  Social History   Tobacco Use  Smoking Status Never   Passive exposure: Current  Smokeless Tobacco Never    [facility] Tobacco Counseling     Are you interested in Tobacco Cessation Medications?  N/A, patient does not use tobacco products Counseled patient on smoking cessation:  N/A, patient does not use tobacco products Reason Tobacco Screening Not Completed: Patient Refused Screening       Social History:  Social History   Substance and Sexual Activity  Alcohol Use Not Currently   Alcohol/week: 2.0 standard drinks of alcohol   Types: 2 Glasses of wine per week     Social History   Substance and Sexual Activity  Drug Use Not Currently    Additional Social History: Marital status: Married Number of Years Married: 3 What types of issues is patient dealing with in the relationship?: none reported Are you sexually active?: Yes What is your sexual orientation?: Heterosexual Does patient have children?: Yes How many children?: 2 How is patient's relationship with their children?: reports good relationship with children                         Allergies:   Allergies  Allergen Reactions   Zithromax [Azithromycin] Rash   Lab Results:  Results for orders placed or performed during the hospital encounter of 12/20/22 (from the past 48 hour(s))   Glucose, capillary     Status: Abnormal   Collection Time: 12/27/22  5:27 PM  Result Value Ref Range   Glucose-Capillary 134 (H) 70 - 99 mg/dL    Comment: Glucose reference range applies only to samples taken after fasting for at least 8 hours.  Basic metabolic panel     Status: Abnormal   Collection Time: 12/28/22  4:17 AM  Result Value Ref Range   Sodium 137 135 - 145 mmol/L   Potassium 3.5 3.5 - 5.1 mmol/L   Chloride 104 98 - 111 mmol/L  CO2 28 22 - 32 mmol/L   Glucose, Bld 117 (H) 70 - 99 mg/dL    Comment: Glucose reference range applies only to samples taken after fasting for at least 8 hours.   BUN 13 6 - 20 mg/dL   Creatinine, Ser 7.82 0.44 - 1.00 mg/dL   Calcium 8.3 (L) 8.9 - 10.3 mg/dL   GFR, Estimated >95 >62 mL/min    Comment: (NOTE) Calculated using the CKD-EPI Creatinine Equation (2021)    Anion gap 5 5 - 15    Comment: Performed at [facility], [address] [place], [place] [address]  CBC     Status: Abnormal   Collection Time: 12/28/22  4:17 AM  Result Value Ref Range   WBC 9.9 4.0 - 10.5 K/uL   RBC 3.27 (L) 3.87 - 5.11 MIL/uL   Hemoglobin 10.2 (L) 12.0 - 15.0 g/dL   HCT 57.8 (L) 46.9 - 62.9 %   MCV 92.0 80.0 - 100.0 fL   MCH 31.2 26.0 - 34.0 pg   MCHC 33.9 30.0 - 36.0 g/dL   RDW 52.8 41.3 - 24.4 %   Platelets 224 150 - 400 K/uL   nRBC 0.0 0.0 - 0.2 %    Comment: Performed at [facility], [address], [place], [place] [address]  Magnesium     Status: None   Collection Time: 12/28/22  4:17 AM  Result Value Ref Range   Magnesium 2.1 1.7 - 2.4 mg/dL    Comment: Performed at [facility], [address] [place], [place] [address]  Glucose, capillary     Status: None   Collection Time: 12/28/22  6:50 AM  Result Value Ref Range   Glucose-Capillary 81 70 - 99 mg/dL    Comment: Glucose reference range applies only to samples taken after fasting for at least 8 hours.   Comment 1 Notify RN   Glucose, capillary     Status: Abnormal    Collection Time: 12/28/22 12:04 PM  Result Value Ref Range   Glucose-Capillary 145 (H) 70 - 99 mg/dL    Comment: Glucose reference range applies only to samples taken after fasting for at least 8 hours.    Blood Alcohol level:  Lab Results  Component Value Date   ETH <10 12/21/2022   ETH <10 06/06/2022    Metabolic Disorder Labs:  Lab Results  Component Value Date   HGBA1C 4.9 12/20/2022   MPG 93.93 12/20/2022   MPG 93.93 07/06/2021   No results found for: "PROLACTIN" Lab Results  Component Value Date   CHOL 208 (H) 02/24/2014   TRIG 81.0 02/24/2014   HDL 54.90 02/24/2014   CHOLHDL 4 02/24/2014   VLDL 16.2 02/24/2014   LDLCALC 137 (H) 02/24/2014    Current Medications: Current Facility-Administered Medications  Medication Dose Route Frequency Provider Last Rate Last Admin   acetaminophen (TYLENOL) tablet 650 mg  650 mg Oral Q6H PRN Starkes-Perry, Juel Burrow, FNP       alum & mag hydroxide-simeth (MAALOX/MYLANTA) 200-200-20 MG/5ML suspension 30 mL  30 mL Oral Q4H PRN Starkes-Perry, Juel Burrow, FNP       clonazePAM (KLONOPIN) tablet 1 mg  1 mg Oral QHS Maryagnes Amos, FNP   1 mg at 12/28/22 2142   diphenhydrAMINE (BENADRYL) capsule 50 mg  50 mg Oral TID PRN Maryagnes Amos, FNP       Or   diphenhydrAMINE (BENADRYL) injection 50 mg  50 mg Intramuscular TID PRN  Maryagnes Amos, FNP       docusate sodium (COLACE) capsule 100 mg  100 mg Oral BID PRN Maryagnes Amos, FNP   100 mg at 12/29/22 0921   doxepin (SINEQUAN) capsule 75 mg  75 mg Oral QHS Maryagnes Amos, FNP   75 mg at 12/28/22 2142   FLUoxetine (PROZAC) capsule 40 mg  40 mg Oral Daily Maryagnes Amos, FNP   40 mg at 12/29/22 0818   hydrocortisone (CORTEF) tablet 50 mg  50 mg Oral Daily Maryagnes Amos, FNP   50 mg at 12/29/22 0818   ipratropium-albuterol (DUONEB) 0.5-2.5 (3) MG/3ML nebulizer solution 3 mL  3 mL Nebulization Q4H PRN Starkes-Perry, Juel Burrow, FNP        linaclotide (LINZESS) capsule 145 mcg  145 mcg Oral QAC breakfast Maryagnes Amos, FNP   145 mcg at 12/29/22 1610   LORazepam (ATIVAN) tablet 2 mg  2 mg Oral TID PRN Maryagnes Amos, FNP       Or   LORazepam (ATIVAN) injection 2 mg  2 mg Intramuscular TID PRN Starkes-Perry, Juel Burrow, FNP       magnesium hydroxide (MILK OF MAGNESIA) suspension 30 mL  30 mL Oral Daily PRN Maryagnes Amos, FNP   30 mL at 12/28/22 1839   pantoprazole (PROTONIX) EC tablet 40 mg  40 mg Oral Daily Maryagnes Amos, FNP   40 mg at 12/29/22 0818   pneumococcal 23 valent vaccine (PNEUMOVAX-23) injection 0.5 mL  0.5 mL Intramuscular Tomorrow-1000 Massengill, Nathan, MD       polyvinyl alcohol (LIQUIFILM TEARS) 1.4 % ophthalmic solution 1 drop  1 drop Both Eyes PRN Starkes-Perry, Juel Burrow, FNP       PTA Medications: Medications Prior to Admission  Medication Sig Dispense Refill Last Dose   Ascorbic Acid (VITAMIN C PO) Take 1 tablet by mouth every morning.      Cholecalciferol (VITAMIN D3) 50 MCG (2000 UT) capsule Take 2,000 Units by mouth daily.      clonazePAM (KLONOPIN) 1 MG tablet Take 1 mg by mouth at bedtime.      doxepin (SINEQUAN) 75 MG capsule Take 75 mg by mouth at bedtime.      FLUoxetine (PROZAC) 20 MG capsule Take 60 mg by mouth every morning.      hydrocortisone (ANUSOL-HC) 2.5 % rectal cream Apply 1 Application topically 4 (four) times daily as needed for hemorrhoids. 30 g 0    hydrocortisone (CORTEF) 10 MG tablet Take 5 tablets (50 mg total) by mouth daily for 2 days. 2 tablet 0    linaclotide (LINZESS) 145 MCG CAPS capsule Take 1 capsule (145 mcg total) by mouth daily before breakfast. 30 capsule 0    loratadine (CLARITIN) 10 MG tablet Take 1 tablet (10 mg total) by mouth daily as needed for allergies. 30 tablet 0    Mouthwashes (MOUTH RINSE) LIQD solution 15 mLs by Mouth Rinse route as needed (for oral care). 118 mL 0    Multiple Vitamin (MULTIVITAMIN WITH MINERALS) TABS tablet  Take 1 tablet by mouth every morning.      pantoprazole (PROTONIX) 40 MG tablet Take 40 mg by mouth 2 (two) times daily.      polyethylene glycol (MIRALAX / GLYCOLAX) 17 g packet Take 17 g by mouth 2 (two) times daily. (Patient taking differently: Take 17 g by mouth daily as needed for moderate constipation.) 14 each 0    polyvinyl alcohol (LIQUIFILM TEARS) 1.4 % ophthalmic solution  Place 1 drop into both eyes as needed for dry eyes. 15 mL 0    potassium chloride SA (KLOR-CON M) 20 MEQ tablet Take 2 tablets (40 mEq total) by mouth daily for 14 days. 14 tablet 0    sodium chloride (OCEAN) 0.65 % SOLN nasal spray Place 1 spray into both nostrils as needed for congestion (nose irritation). 37.5 mL 0     Musculoskeletal: Strength & Muscle Tone: normal Gait & Station: normal Patient leans: NA   Psychiatric Specialty Exam: Physical Exam Constitutional:      Appearance: the patient is not toxic-appearing.  Pulmonary:     Effort: Pulmonary effort is normal.  Neurological:     General: No focal deficit present.     Mental Status: the patient is alert and oriented to person, place, and time.   Review of Systems  Respiratory:  Negative for shortness of breath.   Cardiovascular:  Negative for chest pain.  Gastrointestinal:  Negative for abdominal pain, constipation, diarrhea, nausea and vomiting.  Neurological:  Negative for headaches.      BP (!) 135/103 (BP Location: Left Arm)   Pulse 89   Temp 98.5 F (36.9 C) (Oral)   Resp 16   Ht 5\' 4"  (1.626 m)   Wt 59.4 kg   SpO2 100%   BMI 22.49 kg/m   General Appearance: Fairly Groomed  Eye Contact:  Good  Speech:  Clear and Coherent  Volume:  Normal  Mood:  Euthymic  Affect:  Congruent, tearful on initial assessment, but stable later  Thought Process:  Coherent  Orientation:  Full (Time, Place, and Person)  Thought Content: Logical   Suicidal Thoughts:  No  Homicidal Thoughts:  No  Memory:  Immediate;   Good  Judgement:  poor   Insight:  poor  Psychomotor Activity:  Normal  Concentration:  Concentration: Good  Recall:  Good  Fund of Knowledge: Good  Language: Good  Akathisia:  No  Handed:  not assessed  AIMS (if indicated): not done  Assets:  Communication Skills Desire for Improvement Financial Resources/Insurance Housing Leisure Time Physical Health  ADL's:  Intact  Cognition: WNL  Sleep:  Fair    Treatment Plan Summary: Daily contact with patient to assess and evaluate symptoms and progress in treatment and Medication management  Physician Treatment Plan for Primary Diagnosis: MDD (major depressive disorder), recurrent severe, without psychosis (HCC) Long Term Goal(s): Improvement in symptoms so as ready for discharge  Short Term Goals: Ability to identify changes in lifestyle to reduce recurrence of condition will improve  Physician Treatment Plan for Secondary Diagnosis: Principal Problem:   MDD (major depressive disorder), recurrent severe, without psychosis (HCC)   Long Term Goal(s): Improvement in symptoms so as ready for discharge  Short Term Goals: Ability to verbalize feelings will improve  ASSESSMENT:   Diagnoses / Active Problems: Major depressive disorder Generalized anxiety disorder   PLAN: Safety and Monitoring:  -- Voluntary admission to inpatient psychiatric unit for safety, stabilization and treatment  -- Daily contact with patient to assess and evaluate symptoms and progress in treatment  -- Patient's case to be discussed in multi-disciplinary team meeting  -- Observation Level : q15 minute checks  -- Vital signs:  q12 hours  -- Precautions: suicide, elopement, and assault  2. Psychiatric Diagnoses and Treatment:  -Restart Trintellix at 10 mg and plan to increase dose to 20 mg on 5/6, for the treatment of the patient's depression and anxiety.  Do not feel this medication was  at an effective dose previously. - Continue home Klonopin 1 mg nightly and doxepin 75 mg  nightly - Discontinue home Prozac - Start as needed hydroxyzine   3. Medical Issues Being Addressed:   Labs review, unremarkable   Tobacco Use Disorder  -- Nicotine patch 21mg /24 hours ordered  -- Smoking cessation encouraged  4. Discharge Planning:   -- Social work and case management to assist with discharge planning and identification of hospital follow-up needs prior to discharge  -- Estimated LOS: 5-7 days  -- Discharge Concerns: Need to establish a safety plan; Medication compliance and effectiveness  -- Discharge Goals: Return home with outpatient referrals for mental health follow-up including medication management/psychotherapy -Spouse requesting a phone call on Monday - Will need to have the spouse agree to administer the patient's controlled substances (i.e. Klonopin) because of the patient's severe suicide attempt that involved Xanax.  I certify that inpatient services furnished can reasonably be expected to improve the patient's condition.    Carlyn Reichert, MD PGY-2

## 2022-12-29 NOTE — Progress Notes (Signed)
   12/29/22 2300  Psych Admission Type (Psych Patients Only)  Admission Status Voluntary  Psychosocial Assessment  Patient Complaints Anxiety;Depression  Eye Contact Fair  Facial Expression Animated  Affect Appropriate to circumstance  Speech Logical/coherent  Interaction Assertive  Motor Activity Other (Comment) (WDL)  Appearance/Hygiene Unremarkable  Behavior Characteristics Appropriate to situation  Mood Depressed  Thought Process  Coherency Circumstantial  Content WDL  Delusions None reported or observed  Perception WDL  Hallucination None reported or observed  Judgment Poor  Confusion None  Danger to Self  Current suicidal ideation? Denies  Danger to Others  Danger to Others None reported or observed

## 2022-12-30 LAB — SULFONYLUREA HYPOGLYCEMICS PANEL, SERUM
Acetohexamide: NEGATIVE ug/mL (ref 20–60)
Chlorpropamide: NEGATIVE ug/mL (ref 75–250)
Glimepiride: NEGATIVE ng/mL (ref 80–250)
Glipizide: NEGATIVE ng/mL (ref 200–1000)
Glyburide: NEGATIVE ng/mL
Nateglinide: NEGATIVE ng/mL
Repaglinide: NEGATIVE ng/mL
Tolazamide: NEGATIVE ug/mL
Tolbutamide: NEGATIVE ug/mL (ref 40–100)

## 2022-12-30 LAB — COMPREHENSIVE METABOLIC PANEL
ALT: 35 U/L (ref 0–44)
AST: 32 U/L (ref 15–41)
Albumin: 3.5 g/dL (ref 3.5–5.0)
Alkaline Phosphatase: 52 U/L (ref 38–126)
Anion gap: 8 (ref 5–15)
BUN: 14 mg/dL (ref 6–20)
CO2: 27 mmol/L (ref 22–32)
Calcium: 8.5 mg/dL — ABNORMAL LOW (ref 8.9–10.3)
Chloride: 102 mmol/L (ref 98–111)
Creatinine, Ser: 0.94 mg/dL (ref 0.44–1.00)
GFR, Estimated: >60 mL/min (ref >60)
Glucose, Bld: 88 mg/dL (ref 70–99)
Potassium: 2.7 mmol/L — CL (ref 3.5–5.1)
Sodium: 137 mmol/L (ref 135–145)
Total Bilirubin: 0.5 mg/dL (ref 0.3–1.2)
Total Protein: 6.8 g/dL (ref 6.5–8.1)

## 2022-12-30 MED ORDER — POTASSIUM CHLORIDE CRYS ER 20 MEQ PO TBCR
40.0000 meq | EXTENDED_RELEASE_TABLET | Freq: Once | ORAL | Status: AC
  Administered 2022-12-30: 40 meq via ORAL
  Filled 2022-12-30 (×2): qty 2

## 2022-12-30 MED ORDER — TOPIRAMATE 25 MG PO TABS
50.0000 mg | ORAL_TABLET | Freq: Every day | ORAL | Status: DC
  Administered 2022-12-30 – 2023-01-01 (×3): 50 mg via ORAL
  Filled 2022-12-30 (×6): qty 2

## 2022-12-30 MED ORDER — POTASSIUM CHLORIDE CRYS ER 20 MEQ PO TBCR
20.0000 meq | EXTENDED_RELEASE_TABLET | Freq: Two times a day (BID) | ORAL | Status: DC
  Administered 2022-12-30 – 2023-01-04 (×10): 20 meq via ORAL
  Filled 2022-12-30 (×15): qty 1

## 2022-12-30 NOTE — Plan of Care (Signed)
  Problem: Education: Goal: Knowledge of [facility] Education information/materials will improve Outcome: Progressing Goal: Emotional status will improve Outcome: Progressing Goal: Mental status will improve Outcome: Progressing Goal: Verbalization of understanding the information provided will improve Outcome: Progressing   Problem: Activity: Goal: Interest or engagement in activities will improve Outcome: Progressing Goal: Sleeping patterns will improve Outcome: Progressing   Problem: Coping: Goal: Ability to verbalize frustrations and anger appropriately will improve Outcome: Progressing Goal: Ability to demonstrate self-control will improve Outcome: Progressing   Problem: Health Behavior/Discharge Planning: Goal: Identification of resources available to assist in meeting health care needs will improve Outcome: Progressing Goal: Compliance with treatment plan for underlying cause of condition will improve Outcome: Progressing   Problem: Physical Regulation: Goal: Ability to maintain clinical measurements within normal limits will improve Outcome: Progressing   Problem: Safety: Goal: Periods of time without injury will increase Outcome: Progressing   Problem: Education: Goal: Utilization of techniques to improve thought processes will improve Outcome: Progressing Goal: Knowledge of the prescribed therapeutic regimen will improve Outcome: Progressing   Problem: Activity: Goal: Interest or engagement in leisure activities will improve Outcome: Progressing Goal: Imbalance in normal sleep/wake cycle will improve Outcome: Progressing   Problem: Coping: Goal: Coping ability will improve Outcome: Progressing Goal: Will verbalize feelings Outcome: Progressing   Problem: Health Behavior/Discharge Planning: Goal: Ability to make decisions will improve Outcome: Progressing Goal: Compliance with therapeutic regimen will improve Outcome: Progressing    Problem: Role Relationship: Goal: Will demonstrate positive changes in social behaviors and relationships Outcome: Progressing   Problem: Safety: Goal: Ability to disclose and discuss suicidal ideas will improve Outcome: Progressing Goal: Ability to identify and utilize support systems that promote safety will improve Outcome: Progressing   Problem: Self-Concept: Goal: Will verbalize positive feelings about self Outcome: Progressing Goal: Level of anxiety will decrease Outcome: Progressing   Problem: Activity: Goal: Will identify at least one activity in which they can participate Outcome: Progressing   Problem: Coping: Goal: Ability to identify and develop effective coping behavior will improve Outcome: Progressing Goal: Ability to interact with others will improve Outcome: Progressing Goal: Demonstration of participation in decision-making regarding own care will improve Outcome: Progressing Goal: Ability to use eye contact when communicating with others will improve Outcome: Progressing   Problem: Health Behavior/Discharge Planning: Goal: Identification of resources available to assist in meeting health care needs will improve Outcome: Progressing   Problem: Self-Concept: Goal: Will verbalize positive feelings about self Outcome: Progressing   Problem: Education: Goal: Ability to incorporate positive changes in behavior to improve self-esteem will improve Outcome: Progressing   Problem: Health Behavior/Discharge Planning: Goal: Ability to identify and utilize available resources and services will improve Outcome: Progressing Goal: Ability to remain free from injury will improve Outcome: Progressing   Problem: Self-Concept: Goal: Will verbalize positive feelings about self Outcome: Progressing   Problem: Skin Integrity: Goal: Demonstration of wound healing without infection will improve Outcome: Progressing   Problem: Education: Goal: Ability to make  informed decisions regarding treatment will improve Outcome: Progressing   Problem: Coping: Goal: Coping ability will improve Outcome: Progressing   Problem: Health Behavior/Discharge Planning: Goal: Identification of resources available to assist in meeting health care needs will improve Outcome: Progressing   Problem: Medication: Goal: Compliance with prescribed medication regimen will improve Outcome: Progressing   Problem: Self-Concept: Goal: Ability to disclose and discuss suicidal ideas will improve Outcome: Progressing Goal: Will verbalize positive feelings about self Outcome: Progressing

## 2022-12-30 NOTE — Progress Notes (Signed)
Pt woke up and reported feeling agitated. Pt given PRN Benadryl per MAR.

## 2022-12-30 NOTE — Progress Notes (Signed)
   12/30/22 2318  Psych Admission Type (Psych Patients Only)  Admission Status Voluntary  Psychosocial Assessment  Patient Complaints Anxiety;Insomnia  Eye Contact Fair  Facial Expression Animated  Affect Appropriate to circumstance  Speech Logical/coherent  Interaction Assertive  Motor Activity Other (Comment) (WDL)  Appearance/Hygiene Unremarkable  Behavior Characteristics Appropriate to situation  Mood Pleasant;Anxious  Thought Process  Coherency WDL  Content WDL  Delusions None reported or observed  Perception WDL  Hallucination None reported or observed  Judgment Poor  Confusion None  Danger to Self  Current suicidal ideation? Denies  Danger to Others  Danger to Others None reported or observed

## 2022-12-30 NOTE — Progress Notes (Addendum)
[facility] MD Progress Note  12/30/2022 11:10 AM Emily Park  MRN:  [id number]   Reason for Admission:  The patient is a 52 year old female with no outpatient psychiatric notes or behavioral health admissions available for review. She has an outpatient psychiatrist who prescribes her Klonopin and Adderall. The patient has a past medical history of rectal prolapse, irritable bowel syndrome, and GERD. She was medically admitted on 4/24 after being found unresponsive. She was found to have a blood glucose of 22. Lab work showed high insulin and low C-peptide, consistent with exogenous insulin administration (in a patient who has no history of type I or type 2 diabetes).  She was transferred to the [facility] behavioral hospital on a voluntary basis. The patient is currently on Hospital Day 2.   Chart Review from last 24 hours:  The patient's chart was reviewed and nursing notes were reviewed. The patient's case was discussed in multidisciplinary team meeting. Per [facility] patient is compliant his treatment on the unit.  Potassium level low this morning 2.7, patient complains of headache to the staff received Tylenol as needed, no medications given for agitation or aggression since admission, Atarax as needed for anxiety given 5/3  Information Obtained Today During Patient Interview: The patient was seen and evaluated on the unit. On assessment today the patient reports has history of low potassium secondary to her bowel prolapse and IBS she reports being on potassium twice daily 40 mEq prescribed by GI for the past year.  She also reports her migraines she has been receiving Topamax for the past 10 to 15 years with most recent dose prior to admission to the hospital 150 mg daily.  Patient reports better sleep last night with no nightmares noted, she denies side effect to medications provided reports appetite improved, denies passive or active SI intention or plan.  When discussed with patient again today about her suicide  attempt prior to admission she describes it as "cry for help or attention" she considers her presents here "I am happy about it I feel God is giving me another chance" she denies side effect of medication and agrees to comply with medication changes as discussed with her we will titrate Trintellix in the next few days gradually up to 20 mg daily to address depression symptoms and may consider augmenting with Abilify if needed.  She reports attending groups.  She notes her husband visited last night, she describes her mood was kind of unstable with him and she got irritable with him "I need to call him to apologize I was blaming him for involuntarily committing me but I understand I need help"  Addendum: spoke to Dr Orland Mustard with hospitalist service over the phone, discussed patient;'s care with K repletion this morning and starting tonight, also plan to get CMP and Mg level in the morning, also noted EKG with no abnormalities, no additional care recommended by Dr Artis Flock, Per Dr Artis Flock patient doesn't need to be sent to ER or assessed by hospitalist at this time, will follow  Sleep  Sleep: Improved, 6 hours reported, not interrupted  Principal Problem: MDD (major depressive disorder), recurrent severe, without psychosis (HCC) Diagnosis: Principal Problem:   MDD (major depressive disorder), recurrent severe, without psychosis (HCC)    Past Psychiatric History: See H&P  Past Medical History:  Past Medical History:  Diagnosis Date   Abnormal TSH    ADD (attention deficit disorder)    Anemia    Anxiety    Arrhythmia  Decreased libido    Depression    Heart murmur    Hyperlipidemia    Hypokalemia    Migraine    Nephrolithiasis    Pap smear abnormality of cervix/human papillomavirus (HPV) positive    Sleep difficulties    SVT (supraventricular tachycardia)     Past Surgical History:  Procedure Laterality Date   ABLATION     Cardiac ablation, endometrial ablation   BREAST  LUMPECTOMY     right breast   BREAST LUMPECTOMY Right    CARDIOVERSION     COLONOSCOPY WITH PROPOFOL N/A 05/16/2021   Procedure: COLONOSCOPY WITH PROPOFOL;  Surgeon: Charlott Rakes, MD;  Location: [facility];  Service: Endoscopy;  Laterality: N/A;   COLPOSCOPY     w/ cervical biopsy   DILITATION & CURRETTAGE/HYSTROSCOPY WITH NOVASURE ABLATION N/A 09/11/2014   Procedure: DILATATION & CURETTAGE/HYSTEROSCOPY WITH NOVASURE ABLATION;  Surgeon: Lenoard Aden, MD;  Location: [facility];  Service: Gynecology;  Laterality: N/A;   GIVENS CAPSULE STUDY N/A 05/16/2021   Procedure: GIVENS CAPSULE STUDY;  Surgeon: Charlott Rakes, MD;  Location: [facility];  Service: Endoscopy;  Laterality: N/A;   XI ROBOT ASSISTED RECTOPEXY N/A 07/07/2021   Procedure: XI ROBOT ASSISTED RECTOPEXY;  Surgeon: Karie Soda, MD;  Location: [facility];  Service: General;  Laterality: N/A;   XI ROBOTIC ASSISTED LOWER ANTERIOR RESECTION N/A 07/07/2021   Procedure: XI ROBOTIC ASSISTED LOW ANTERIOR RECTOSIGMOID RESECTION WITH RIGID PROCTOSCOPY, TAP BLOCK;  Surgeon: Karie Soda, MD;  Location: [facility];  Service: General;  Laterality: N/A;   Family History:  Family History  Problem Relation Age of Onset   Hypothyroidism Mother    Depression Mother    Cancer Mother        thyroid   Thyroid disease Mother    Hypertension Father    Heart attack Father        Age 62   AAA (abdominal aortic aneurysm) Father    Hypothyroidism Sister    Hyperthyroidism Sister    Diabetes Maternal Grandfather    Breast cancer Paternal Grandmother    Family Psychiatric  History: None reported Social History: Lives with husband at home, used to work as a Engineer, civil (consulting) currently unemployed, denies legal problems or pending court dates, denies having access to guns at home  Current Medications: Current Facility-Administered Medications  Medication Dose Route Frequency Provider Last Rate Last Admin   acetaminophen (TYLENOL) tablet 650 mg  650 mg Oral  Q6H PRN Maryagnes Amos, FNP   650 mg at 12/30/22 0939   alum & mag hydroxide-simeth (MAALOX/MYLANTA) 200-200-20 MG/5ML suspension 30 mL  30 mL Oral Q4H PRN Maryagnes Amos, FNP   30 mL at 12/29/22 1600   clonazePAM (KLONOPIN) tablet 1 mg  1 mg Oral QHS Maryagnes Amos, FNP   1 mg at 12/29/22 2009   diphenhydrAMINE (BENADRYL) capsule 50 mg  50 mg Oral TID PRN Maryagnes Amos, FNP       Or   diphenhydrAMINE (BENADRYL) injection 50 mg  50 mg Intramuscular TID PRN Starkes-Perry, Juel Burrow, FNP       docusate sodium (COLACE) capsule 100 mg  100 mg Oral BID PRN Maryagnes Amos, FNP   100 mg at 12/29/22 0921   doxepin (SINEQUAN) capsule 75 mg  75 mg Oral QHS Maryagnes Amos, FNP   75 mg at 12/29/22 2114   hydrOXYzine (ATARAX) tablet 10 mg  10 mg Oral TID PRN Carlyn Reichert, MD   10 mg at 12/29/22  1558   ipratropium-albuterol (DUONEB) 0.5-2.5 (3) MG/3ML nebulizer solution 3 mL  3 mL Nebulization Q4H PRN Starkes-Perry, Juel Burrow, FNP       linaclotide (LINZESS) capsule 145 mcg  145 mcg Oral QAC breakfast Maryagnes Amos, FNP   145 mcg at 12/30/22 1610   LORazepam (ATIVAN) tablet 2 mg  2 mg Oral TID PRN Maryagnes Amos, FNP       Or   LORazepam (ATIVAN) injection 2 mg  2 mg Intramuscular TID PRN Maryagnes Amos, FNP       magnesium hydroxide (MILK OF MAGNESIA) suspension 30 mL  30 mL Oral Daily PRN Maryagnes Amos, FNP   30 mL at 12/28/22 1839   pantoprazole (PROTONIX) EC tablet 40 mg  40 mg Oral Daily Maryagnes Amos, FNP   40 mg at 12/30/22 9604   polyvinyl alcohol (LIQUIFILM TEARS) 1.4 % ophthalmic solution 1 drop  1 drop Both Eyes PRN Maryagnes Amos, FNP   1 drop at 12/30/22 5409   vortioxetine HBr (TRINTELLIX) tablet 10 mg  10 mg Oral Daily Carlyn Reichert, MD   10 mg at 12/30/22 8119    Lab Results:  Results for orders placed or performed during the hospital encounter of 12/28/22 (from the past 48 hour(s))   Comprehensive metabolic panel     Status: Abnormal   Collection Time: 12/30/22  6:35 AM  Result Value Ref Range   Sodium 137 135 - 145 mmol/L   Potassium 2.7 (LL) 3.5 - 5.1 mmol/L    Comment: CRITICAL RESULT CALLED TO, READ BACK BY AND VERIFIED WITH Gardiner Sleeper, RN 12/30/22 0809 J. COLE   Chloride 102 98 - 111 mmol/L   CO2 27 22 - 32 mmol/L   Glucose, Bld 88 70 - 99 mg/dL    Comment: Glucose reference range applies only to samples taken after fasting for at least 8 hours.   BUN 14 6 - 20 mg/dL   Creatinine, Ser 1.47 0.44 - 1.00 mg/dL   Calcium 8.5 (L) 8.9 - 10.3 mg/dL   Total Protein 6.8 6.5 - 8.1 g/dL   Albumin 3.5 3.5 - 5.0 g/dL   AST 32 15 - 41 U/L   ALT 35 0 - 44 U/L   Alkaline Phosphatase 52 38 - 126 U/L   Total Bilirubin 0.5 0.3 - 1.2 mg/dL   GFR, Estimated >82 >95 mL/min    Comment: (NOTE) Calculated using the CKD-EPI Creatinine Equation (2021)    Anion gap 8 5 - 15    Comment: Performed at [facility], [address], [place], [place] [address]    Blood Alcohol level:  Lab Results  Component Value Date   [facility] <10 12/21/2022   ETH <10 06/06/2022    Metabolic Disorder Labs: Lab Results  Component Value Date   HGBA1C 4.9 12/20/2022   MPG 93.93 12/20/2022   MPG 93.93 07/06/2021   No results found for: "PROLACTIN" Lab Results  Component Value Date   CHOL 208 (H) 02/24/2014   TRIG 81.0 02/24/2014   HDL 54.90 02/24/2014   CHOLHDL 4 02/24/2014   VLDL 16.2 02/24/2014   LDLCALC 137 (H) 02/24/2014    Physical Findings: AIMS: Facial and Oral Movements Muscles of Facial Expression: None, normal Lips and Perioral Area: None, normal Jaw: None, normal Tongue: None, normal,Extremity Movements Upper (arms, wrists, hands, fingers): None, normal Lower (legs, knees, ankles, toes): None, normal, Trunk Movements Neck, shoulders, hips: None, normal, Overall Severity Severity of abnormal movements (  highest score from questions above): None,  normal Incapacitation due to abnormal movements: None, normal Patient's awareness of abnormal movements (rate only patient's report): No Awareness, Dental Status Current problems with teeth and/or dentures?: No Does patient usually wear dentures?: No  CIWA:    COWS:     Musculoskeletal: Strength & Muscle Tone: within normal limits Gait & Station: normal Patient leans: N/A  Psychiatric Specialty Exam:  General Appearance: Fairly dressed and groomed, appears at stated age or slightly older  Behavior: Calm and cooperative  Psychomotor Activity: No psychomotor agitation but mild retardation noted  Eye Contact: Fair Speech: Fluent, normal   Mood: Dysphoric but seems to be minimizing Affect: Congruent  Thought Process: Linear and goal-directed Descriptions of Associations: Intact, concrete Thought Content: Hallucinations: Denies AH, VH  Delusions: No paranoia  Suicidal Thoughts: Denies active SI, intention, plan  Homicidal Thoughts: Denies HI, intention, plan   Alertness/Orientation: Alert and oriented  Insight: Improved, limited seems to be minimizing symptoms Judgment: Improved  Memory: Fair  Art therapist  Concentration: Fair Attention Span: Fair Recall: [employer] of Knowledge: Fair   Assets  Assets: Manufacturing systems engineer; Social Support    Physical Exam: Physical Exam Vitals and nursing note reviewed.    Review of Systems  All other systems reviewed and are negative.  Blood pressure (!) 126/94, pulse 90, temperature 98.4 F (36.9 C), temperature source Oral, resp. rate 16, height 5\' 4"  (1.626 m), weight 59.4 kg, SpO2 100 %. Body mass index is 22.49 kg/m.   Treatment Plan Summary: Daily contact with patient to assess and evaluate symptoms and progress in treatment and Medication management  ASSESSMENT:  Diagnoses / Active Problems: Principal Problem: MDD (major depressive disorder), recurrent severe, without psychosis (HCC) Diagnosis:  Principal Problem:   MDD (major depressive disorder), recurrent severe, without psychosis (HCC)   PLAN: Safety and Monitoring:  -- Voluntary admission to inpatient psychiatric unit for safety, stabilization and treatment  -- Daily contact with patient to assess and evaluate symptoms and progress in treatment  -- Patient's case to be discussed in multi-disciplinary team meeting  -- Observation Level : q15 minute checks  -- Vital signs:  q12 hours  -- Precautions: suicide, elopement, and assault  2. Medications:   Trintellix 10 mg daily was started this morning to help with depression symptoms with plan to titrate to 20 mg in few days if well-tolerated  Continue Klonopin 1 mg at bedtime, home medication for anxiety and sleep  Continue as needed Atarax for anxiety  Continue doxepin 75 mg daily at bedtime, home medication for sleep  Continue Protonix 40 mg daily for GERD, home medication  Continue Linzess 145 mcg daily before breakfast for IBS, home medication  Start k dur scheduled 20 meq twice daily, patient reports home dose 40 twice daily, will monitor potassium closely and adjust accordingly  Start Topamax 50 mg daily for migraine, patient reports home dose for 150 mg for the past 10 to 15 years, will follow and adjust accordingly   The risks/benefits/side-effects/alternatives to this medication were discussed in detail with the patient and time was given for questions. The patient consents to medication trial.    -- Metabolic profile and EKG monitoring obtained while on an atypical antipsychotic (BMI: Lipid Panel: HbgA1c: QTc:)      3. Pertinent labs: CMP this morning no abnormalities except for very low potassium 2.7 which was at 3.5 on 5/2.  Other lab results were reviewed from recent hospitalization Hemoglobin A1c 4/24 4.9 EKG 5/2 QTC  462 EKG 12/30/22 NSR, QTC 435     Lab ordered: CMP 5/5   4. Group and Therapy: -- Encouraged patient to participate in unit milieu and in  scheduled group therapies       -- Short Term Goals: Ability to identify changes in lifestyle to reduce recurrence of condition will improve, Ability to verbalize feelings will improve, Ability to disclose and discuss suicidal ideas, Ability to demonstrate self-control will improve, and Ability to identify and develop effective coping behaviors will improve  -- Long Term Goals: Improvement in symptoms so as ready for discharge  5. Discharge Planning:   -- Social work and case management to assist with discharge planning and identification of hospital follow-up needs prior to discharge  -- Estimated LOS: 5-7 days  -- Discharge Concerns: Need to establish a safety plan; Medication compliance and effectiveness  -- Discharge Goals: Return home with outpatient referrals for mental health follow-up including medication management/psychotherapy  Given incident of overdose and suicide attempt prior to admission will discussed with patient's husband to manage her medications on daily basis and to limit patient access to her medications to ensure compliance with treatment after discharge.    Total Time Spent in Direct Patient Care:  I personally spent 35 minutes on the unit in direct patient care. The direct patient care time included face-to-face time with the patient, reviewing the patient's chart, communicating with other professionals, and coordinating care. Greater than 50% of this time was spent in counseling or coordinating care with the patient regarding goals of hospitalization, psycho-education, and discharge planning needs.   Cherolyn Behrle Abbott Pao, MD 12/30/2022, 11:10 AM

## 2022-12-30 NOTE — [facility] Group Notes (Signed)
Adult Psychoeducational Group Note  Date:  12/30/2022 Time:  2:41 PM  Group Topic/Focus:  Emotional Education:   The focus of this group is to discuss what feelings/emotions are, and how they are experienced.  The group focused on the process of reframing thought process from negative to positive.  Participation Level:  Active  Participation Quality:  Attentive and Sharing  Affect:  Appropriate  Cognitive:  Appropriate  Insight: Appropriate  Engagement in Group:  Engaged  Modes of Intervention:  Discussion  Additional Comments:  Pt described her biggest issue is worrying about the future and not being capable of being successful.  Pt recognized she needs to stop, refocus, breath and get help if needed.  Tanysha Quant A Marda Breidenbach-McCall 12/30/2022, 2:41 PM

## 2022-12-30 NOTE — [facility] Group Notes (Signed)
Adult Psychoeducational Group Note  Date:  12/30/2022 Time:  9:46 AM  Group Topic/Focus:  Orientation:   The focus of this group is to educate the patient on the purpose and policies of crisis stabilization and provide a format to answer questions about their admission.  The group details unit policies and expectations of patients while admitted.  Participation Level:  Active  Participation Quality:  Appropriate and Sharing  Affect:  Appropriate  Cognitive:  Appropriate  Insight: Appropriate  Engagement in Group:  Engaged  Modes of Intervention:  Orientation  Additional Comments:  Pt goal is to stay focused and stay positive.  Anne Boltz A Kayda Allers-McCall 12/30/2022, 9:46 AM

## 2022-12-30 NOTE — Progress Notes (Signed)
Lab called with a critical value of 2.7 potassium at 0811. Dr. Abbott Pao was notified at 60. New order for of potassium one time dose.

## 2022-12-30 NOTE — [facility] Group Notes (Signed)
[facility] Group Notes:  (Nursing/MHT/Case Management/Adjunct)  Date:  12/30/2022  Time:  9:21 PM  Type of Therapy:   Wrap-up group  Participation Level:  Active  Participation Quality:  Appropriate  Affect:  Appropriate  Cognitive:  Appropriate  Insight:  Appropriate  Engagement in Group:  Engaged  Modes of Intervention:  Education  Summary of Progress/Problems: Goal to stay positive, day 8/10. Participated in an emotional skills exercise.   Emily Park 12/30/2022, 9:21 PM

## 2022-12-30 NOTE — Progress Notes (Signed)
Patient appears pleasant. Patient denies SI/HI/AVH. Pt reports anxiety is 7/10 and depression is 5/10. Pt reports improved sleep and fair appetite. Patient complied with morning medication with no reported side effects. Potassium was give per [facility]. Patient remains safe on Q55min checks and contracts for safety.      12/30/22 1005  Psych Admission Type (Psych Patients Only)  Admission Status Voluntary  Psychosocial Assessment  Patient Complaints Anxiety;Depression  Eye Contact Fair  Facial Expression Animated  Affect Appropriate to circumstance  Speech Logical/coherent  Interaction Assertive  Appearance/Hygiene Unremarkable  Behavior Characteristics Cooperative;Anxious  Mood Depressed  Thought Process  Coherency WDL  Content WDL  Delusions None reported or observed  Perception WDL  Hallucination None reported or observed  Judgment Poor  Confusion None  Danger to Self  Current suicidal ideation? Denies  Danger to Others  Danger to Others None reported or observed

## 2022-12-31 LAB — COMPREHENSIVE METABOLIC PANEL
ALT: 31 U/L (ref 0–44)
AST: 25 U/L (ref 15–41)
Albumin: 3.5 g/dL (ref 3.5–5.0)
Alkaline Phosphatase: 44 U/L (ref 38–126)
Anion gap: 9 (ref 5–15)
BUN: 18 mg/dL (ref 6–20)
CO2: 24 mmol/L (ref 22–32)
Calcium: 8.8 mg/dL — ABNORMAL LOW (ref 8.9–10.3)
Chloride: 103 mmol/L (ref 98–111)
Creatinine, Ser: 1.11 mg/dL — ABNORMAL HIGH (ref 0.44–1.00)
GFR, Estimated: 59 mL/min — ABNORMAL LOW (ref >60)
Glucose, Bld: 89 mg/dL (ref 70–99)
Potassium: 3.1 mmol/L — ABNORMAL LOW (ref 3.5–5.1)
Sodium: 136 mmol/L (ref 135–145)
Total Bilirubin: 0.3 mg/dL (ref 0.3–1.2)
Total Protein: 6.7 g/dL (ref 6.5–8.1)

## 2022-12-31 LAB — MAGNESIUM: Magnesium: 2.1 mg/dL (ref 1.7–2.4)

## 2022-12-31 MED ORDER — VORTIOXETINE HBR 20 MG PO TABS
20.0000 mg | ORAL_TABLET | Freq: Every day | ORAL | Status: DC
  Administered 2023-01-01 – 2023-01-04 (×4): 20 mg via ORAL
  Filled 2022-12-31 (×6): qty 1

## 2022-12-31 MED ORDER — HYDROXYZINE HCL 25 MG PO TABS
25.0000 mg | ORAL_TABLET | Freq: Three times a day (TID) | ORAL | Status: DC | PRN
  Administered 2022-12-31 – 2023-01-03 (×7): 25 mg via ORAL
  Filled 2022-12-31 (×7): qty 1

## 2022-12-31 NOTE — Progress Notes (Addendum)
[facility] MD Progress Note  12/31/2022 10:57 AM Emily Park  MRN:  [id number]   Reason for Admission:  The patient is a 52 year old female with no outpatient psychiatric notes or behavioral health admissions available for review. She has an outpatient psychiatrist who prescribes her Klonopin and Adderall. The patient has a past medical history of rectal prolapse, irritable bowel syndrome, and GERD. She was medically admitted on 4/24 after being found unresponsive. She was found to have a blood glucose of 22. Lab work showed high insulin and low C-peptide, consistent with exogenous insulin administration (in a patient who has no history of type I or type 2 diabetes).  She was transferred to the [facility] behavioral hospital on a voluntary basis. The patient is currently on Hospital Day 3.   Chart Review from last 24 hours:  The patient's chart was reviewed and nursing notes were reviewed. The patient's case was discussed in multidisciplinary team meeting. Per [facility], The patient is compliant his treatment on the unit.  Potassium level improved this morning 3.1, patient is denying any problems with staff and he presents pleasant and interactive in the milieu.  Information Obtained Today During Patient Interview: The patient was seen and evaluated on the unit.  Upon evaluation today patient reports had a good visit with her sister yesterday and also had a good phone call with her husband.  She reports interacting well in the milieu and attending groups. Continues to regret and feels ashamed regard her suicide attempt. Reports poor sleep/interrupted even with doxepin and klonopin, discussed using vistaril prn for anxiety or sleep. Feels less depressed, denies passive or active si intention or plan, denies hi or avh. Denies se to meds, agrees with meds changes plan.  Sleep  Sleep: Improved, 6 hours reported, yet interrupted  Principal Problem: MDD (major depressive disorder), recurrent severe, without psychosis  (HCC) Diagnosis: Principal Problem:   MDD (major depressive disorder), recurrent severe, without psychosis (HCC)  Past Psychiatric History: See H&P  Past Medical History:  Past Medical History:  Diagnosis Date   Abnormal TSH    ADD (attention deficit disorder)    Anemia    Anxiety    Arrhythmia    Decreased libido    Depression    Heart murmur    Hyperlipidemia    Hypokalemia    Migraine    Nephrolithiasis    Pap smear abnormality of cervix/human papillomavirus (HPV) positive    Sleep difficulties    SVT (supraventricular tachycardia)     Past Surgical History:  Procedure Laterality Date   ABLATION     Cardiac ablation, endometrial ablation   BREAST LUMPECTOMY     right breast   BREAST LUMPECTOMY Right    CARDIOVERSION     COLONOSCOPY WITH PROPOFOL N/A 05/16/2021   Procedure: COLONOSCOPY WITH PROPOFOL;  Surgeon: Charlott Rakes, MD;  Location: [facility];  Service: Endoscopy;  Laterality: N/A;   COLPOSCOPY     w/ cervical biopsy   DILITATION & CURRETTAGE/HYSTROSCOPY WITH NOVASURE ABLATION N/A 09/11/2014   Procedure: DILATATION & CURETTAGE/HYSTEROSCOPY WITH NOVASURE ABLATION;  Surgeon: Lenoard Aden, MD;  Location: [facility];  Service: Gynecology;  Laterality: N/A;   GIVENS CAPSULE STUDY N/A 05/16/2021   Procedure: GIVENS CAPSULE STUDY;  Surgeon: Charlott Rakes, MD;  Location: [facility];  Service: Endoscopy;  Laterality: N/A;   XI ROBOT ASSISTED RECTOPEXY N/A 07/07/2021   Procedure: XI ROBOT ASSISTED RECTOPEXY;  Surgeon: Karie Soda, MD;  Location: [facility];  Service: General;  Laterality: N/A;  XI ROBOTIC ASSISTED LOWER ANTERIOR RESECTION N/A 07/07/2021   Procedure: XI ROBOTIC ASSISTED LOW ANTERIOR RECTOSIGMOID RESECTION WITH RIGID PROCTOSCOPY, TAP BLOCK;  Surgeon: Karie Soda, MD;  Location: [facility];  Service: General;  Laterality: N/A;   Family History:  Family History  Problem Relation Age of Onset   Hypothyroidism Mother    Depression Mother    Cancer  Mother        thyroid   Thyroid disease Mother    Hypertension Father    Heart attack Father        Age 53   AAA (abdominal aortic aneurysm) Father    Hypothyroidism Sister    Hyperthyroidism Sister    Diabetes Maternal Grandfather    Breast cancer Paternal Grandmother    Family Psychiatric  History: None reported Social History: Lives with husband at home, used to work as a Engineer, civil (consulting) currently unemployed, denies legal problems or pending court dates, denies having access to guns at home  Current Medications: Current Facility-Administered Medications  Medication Dose Route Frequency Provider Last Rate Last Admin   acetaminophen (TYLENOL) tablet 650 mg  650 mg Oral Q6H PRN Maryagnes Amos, FNP   650 mg at 12/30/22 0939   alum & mag hydroxide-simeth (MAALOX/MYLANTA) 200-200-20 MG/5ML suspension 30 mL  30 mL Oral Q4H PRN Maryagnes Amos, FNP   30 mL at 12/29/22 1600   clonazePAM (KLONOPIN) tablet 1 mg  1 mg Oral QHS Maryagnes Amos, FNP   1 mg at 12/30/22 2104   diphenhydrAMINE (BENADRYL) capsule 50 mg  50 mg Oral TID PRN Maryagnes Amos, FNP   50 mg at 12/30/22 2254   Or   diphenhydrAMINE (BENADRYL) injection 50 mg  50 mg Intramuscular TID PRN Maryagnes Amos, FNP       docusate sodium (COLACE) capsule 100 mg  100 mg Oral BID PRN Maryagnes Amos, FNP   100 mg at 12/29/22 0921   doxepin (SINEQUAN) capsule 75 mg  75 mg Oral QHS Maryagnes Amos, FNP   75 mg at 12/30/22 2104   hydrOXYzine (ATARAX) tablet 10 mg  10 mg Oral TID PRN Carlyn Reichert, MD   10 mg at 12/30/22 1618   ipratropium-albuterol (DUONEB) 0.5-2.5 (3) MG/3ML nebulizer solution 3 mL  3 mL Nebulization Q4H PRN Starkes-Perry, Juel Burrow, FNP       linaclotide (LINZESS) capsule 145 mcg  145 mcg Oral QAC breakfast Maryagnes Amos, FNP   145 mcg at 12/31/22 0610   LORazepam (ATIVAN) tablet 2 mg  2 mg Oral TID PRN Maryagnes Amos, FNP       Or   LORazepam (ATIVAN) injection 2  mg  2 mg Intramuscular TID PRN Starkes-Perry, Juel Burrow, FNP       magnesium hydroxide (MILK OF MAGNESIA) suspension 30 mL  30 mL Oral Daily PRN Maryagnes Amos, FNP   30 mL at 12/28/22 1839   pantoprazole (PROTONIX) EC tablet 40 mg  40 mg Oral Daily Maryagnes Amos, FNP   40 mg at 12/31/22 0745   polyvinyl alcohol (LIQUIFILM TEARS) 1.4 % ophthalmic solution 1 drop  1 drop Both Eyes PRN Maryagnes Amos, FNP   1 drop at 12/30/22 1344   potassium chloride SA (KLOR-CON M) CR tablet 20 mEq  20 mEq Oral BID Abbott Pao, Kylil Swopes, MD   20 mEq at 12/31/22 0745   topiramate (TOPAMAX) tablet 50 mg  50 mg Oral Daily Henry Demeritt, MD   50 mg at 12/31/22  9604   vortioxetine HBr (TRINTELLIX) tablet 10 mg  10 mg Oral Daily Carlyn Reichert, MD   10 mg at 12/31/22 5409    Lab Results:  Results for orders placed or performed during the hospital encounter of 12/28/22 (from the past 48 hour(s))  Comprehensive metabolic panel     Status: Abnormal   Collection Time: 12/30/22  6:35 AM  Result Value Ref Range   Sodium 137 135 - 145 mmol/L   Potassium 2.7 (LL) 3.5 - 5.1 mmol/L    Comment: CRITICAL RESULT CALLED TO, READ BACK BY AND VERIFIED WITH Gardiner Sleeper, RN 12/30/22 [address]   Chloride 102 98 - 111 mmol/L   CO2 27 22 - 32 mmol/L   Glucose, Bld 88 70 - 99 mg/dL    Comment: Glucose reference range applies only to samples taken after fasting for at least 8 hours.   BUN 14 6 - 20 mg/dL   Creatinine, Ser 8.11 0.44 - 1.00 mg/dL   Calcium 8.5 (L) 8.9 - 10.3 mg/dL   Total Protein 6.8 6.5 - 8.1 g/dL   Albumin 3.5 3.5 - 5.0 g/dL   AST 32 15 - 41 U/L   ALT 35 0 - 44 U/L   Alkaline Phosphatase 52 38 - 126 U/L   Total Bilirubin 0.5 0.3 - 1.2 mg/dL   GFR, Estimated >91 >47 mL/min    Comment: (NOTE) Calculated using the CKD-EPI Creatinine Equation (2021)    Anion gap 8 5 - 15    Comment: Performed at [facility], [address], [place], [place] [address]  Comprehensive metabolic  panel     Status: Abnormal   Collection Time: 12/31/22  6:54 AM  Result Value Ref Range   Sodium 136 135 - 145 mmol/L   Potassium 3.1 (L) 3.5 - 5.1 mmol/L   Chloride 103 98 - 111 mmol/L   CO2 24 22 - 32 mmol/L   Glucose, Bld 89 70 - 99 mg/dL    Comment: Glucose reference range applies only to samples taken after fasting for at least 8 hours.   BUN 18 6 - 20 mg/dL   Creatinine, Ser 2.13 (H) 0.44 - 1.00 mg/dL   Calcium 8.8 (L) 8.9 - 10.3 mg/dL   Total Protein 6.7 6.5 - 8.1 g/dL   Albumin 3.5 3.5 - 5.0 g/dL   AST 25 15 - 41 U/L   ALT 31 0 - 44 U/L   Alkaline Phosphatase 44 38 - 126 U/L   Total Bilirubin 0.3 0.3 - 1.2 mg/dL   GFR, Estimated 59 (L) >60 mL/min    Comment: (NOTE) Calculated using the CKD-EPI Creatinine Equation (2021)    Anion gap 9 5 - 15    Comment: Performed at [facility], [address], [place], [place] [address]  Magnesium     Status: None   Collection Time: 12/31/22  6:54 AM  Result Value Ref Range   Magnesium 2.1 1.7 - 2.4 mg/dL    Comment: Performed at [facility], [address], [place], [place] [address]    Blood Alcohol level:  Lab Results  Component Value Date   [facility] <10 12/21/2022   ETH <10 06/06/2022    Metabolic Disorder Labs: Lab Results  Component Value Date   HGBA1C 4.9 12/20/2022   MPG 93.93 12/20/2022   MPG 93.93 07/06/2021   No results found for: "PROLACTIN" Lab Results  Component Value Date   CHOL 208 (H) 02/24/2014  TRIG 81.0 02/24/2014   HDL 54.90 02/24/2014   CHOLHDL 4 02/24/2014   VLDL 16.2 02/24/2014   LDLCALC 137 (H) 02/24/2014    Physical Findings: AIMS: Facial and Oral Movements Muscles of Facial Expression: None, normal Lips and Perioral Area: None, normal Jaw: None, normal Tongue: None, normal,Extremity Movements Upper (arms, wrists, hands, fingers): None, normal Lower (legs, knees, ankles, toes): None, normal, Trunk Movements Neck, shoulders, hips: None, normal,  Overall Severity Severity of abnormal movements (highest score from questions above): None, normal Incapacitation due to abnormal movements: None, normal Patient's awareness of abnormal movements (rate only patient's report): No Awareness, Dental Status Current problems with teeth and/or dentures?: No Does patient usually wear dentures?: No  CIWA:    COWS:     Musculoskeletal: Strength & Muscle Tone: within normal limits Gait & Station: normal Patient leans: N/A  Psychiatric Specialty Exam:  General Appearance: Fairly dressed and groomed, appears at stated age or slightly older  Behavior: Calm and cooperative  Psychomotor Activity: No psychomotor agitation but mild retardation noted  Eye Contact: Fair Speech: Fluent, normal   Mood: Dysphoric but seems to be minimizing Affect: Congruent  Thought Process: Linear and goal-directed Descriptions of Associations: Intact, concrete Thought Content: Hallucinations: Denies AH, VH  Delusions: No paranoia  Suicidal Thoughts: Denies active SI, intention, plan  Homicidal Thoughts: Denies HI, intention, plan   Alertness/Orientation: Alert and oriented  Insight: Improved, limited seems to be minimizing symptoms Judgment: Improved  Memory: Fair  Art therapist  Concentration: Fair Attention Span: Fair Recall: [employer] of Knowledge: Fair   Assets  Assets: Manufacturing systems engineer; Social Support    Physical Exam: Physical Exam Vitals and nursing note reviewed.    Review of Systems  All other systems reviewed and are negative.  Blood pressure (!) 116/91, pulse 92, temperature 98.2 F (36.8 C), temperature source Oral, resp. rate 16, height 5\' 4"  (1.626 m), weight 59.4 kg, SpO2 100 %. Body mass index is 22.49 kg/m.   Treatment Plan Summary: Daily contact with patient to assess and evaluate symptoms and progress in treatment and Medication management  ASSESSMENT:  Diagnoses / Active Problems: Principal  Problem: MDD (major depressive disorder), recurrent severe, without psychosis (HCC) Diagnosis: Principal Problem:   MDD (major depressive disorder), recurrent severe, without psychosis (HCC)   PLAN: Safety and Monitoring:  -- Voluntary admission to inpatient psychiatric unit for safety, stabilization and treatment  -- Daily contact with patient to assess and evaluate symptoms and progress in treatment  -- Patient's case to be discussed in multi-disciplinary team meeting  -- Observation Level : q15 minute checks  -- Vital signs:  q12 hours  -- Precautions: suicide, elopement, and assault  2. Medications:   Trintellix 10 mg daily was started this morning to help with depression symptoms with plan to titrate to 20 mg starting tomorrow 5/6 given it is well-tolerated  Continue Klonopin 1 mg at bedtime, home medication for anxiety and sleep  Titrate Atarax from 20 to 25 mg to use as needed for anxiety or sleep  Continue doxepin 75 mg daily at bedtime, home medication for sleep  Continue Protonix 40 mg daily for GERD, home medication  Continue Linzess 145 mcg daily before breakfast for IBS, home medication  Continue k dur scheduled 20 meq twice daily, patient reports home dose 40 once daily for many years prescribed by GI Dr., will monitor potassium closely and adjust accordingly  Continue Topamax 50 mg daily for migraine, patient reports home dose for 150 mg for  the past 10 to 15 years, will follow and adjust accordingly   The risks/benefits/side-effects/alternatives to this medication were discussed in detail with the patient and time was given for questions. The patient consents to medication trial.    -- Metabolic profile and EKG monitoring obtained while on an atypical antipsychotic (BMI: Lipid Panel: HbgA1c: QTc:)      3. Pertinent labs: CMP this morning no abnormalities except for very low potassium 2.7 which was at 3.5 on 5/2.  Other lab results were reviewed from recent  hospitalization Hemoglobin A1c 4/24 4.9 EKG 5/2 QTC 462 EKG 12/30/22 NSR, QTC 435     Lab ordered: CMP daily to monitor potassium   4. Group and Therapy: -- Encouraged patient to participate in unit milieu and in scheduled group therapies       -- Short Term Goals: Ability to identify changes in lifestyle to reduce recurrence of condition will improve, Ability to verbalize feelings will improve, Ability to disclose and discuss suicidal ideas, Ability to demonstrate self-control will improve, and Ability to identify and develop effective coping behaviors will improve  -- Long Term Goals: Improvement in symptoms so as ready for discharge  5. Discharge Planning:   -- Social work and case management to assist with discharge planning and identification of hospital follow-up needs prior to discharge  -- Estimated LOS: 5-7 days  -- Discharge Concerns: Need to establish a safety plan; Medication compliance and effectiveness  -- Discharge Goals: Return home with outpatient referrals for mental health follow-up including medication management/psychotherapy  Given incident of overdose and suicide attempt prior to admission will discussed with patient's husband to manage her medications on daily basis and to limit patient access to her medications to ensure compliance with treatment after discharge.  Patient with multiple serious suicide attempts/gestures over the past few years, hide acute and chronic suicidal risk, after establishing crisis stabilization, patient will benefit from IOP or PHP after discharge, will follow    Total Time Spent in Direct Patient Care:  I personally spent 35 minutes on the unit in direct patient care. The direct patient care time included face-to-face time with the patient, reviewing the patient's chart, communicating with other professionals, and coordinating care. Greater than 50% of this time was spent in counseling or coordinating care with the patient regarding goals of  hospitalization, psycho-education, and discharge planning needs.   Jerime Arif Abbott Pao, MD 12/31/2022, 10:57 AM

## 2022-12-31 NOTE — [facility] Suicide Risk Assessment (Signed)
[facility] INPATIENT:  Family/Significant Other Suicide Prevention Education  Suicide Prevention Education:  Education Completed; Emily Park, spouse, [phone],  (name of family member/significant other) has been identified by the patient as the family member/significant other with whom the patient will be residing, and identified as the person(s) who will aid the patient in the event of a mental health crisis (suicidal ideations/suicide attempt).    Husband is concerned that patient's behavior is still erratic and she has continued to be verbally aggressive with husband and son even while here in the hospital.  When her behavior changes, it is almost like she is a different person.  He remains very concerned about her being suicidal, possibly being discharged too soon.  Two nights before her suicide attempt, the patient was arguing with husband and tried to overdose on pills, which he stopped her from doing.  Three months before this, she got his gun to use and he had to take that away from her as well.  He believes she has Bipolar disorder and is happy she has been put on a mood stabilizer.  Now, there are no firearms in the home as he removed it when she grabbed it to use.  He has purchased a weekly pill planner and intends to administer her medicines when she comes home, leaving them in a locked safe otherwise.  He has had to put his own medicines in the safe because she tries to take them when she is upset.  He is afraid this will make her angry, but was encouraged to tell her that the hospital staff recommended this.  We discussed at length her follow-up, as both her new therapist at [facility] and her psychiatrist Rupinder Evelene Croon are out of network for their insurance.  Because of her long-term and ongoing instability, CSW offered either IOP or PHP to help to continue to stabilize the current crisis.  He was quite excited about either, feels that she could participate well  virtually.  With written consent from the patient, the family member/significant other has been provided the following suicide prevention education, prior to the and/or following the discharge of the patient.  The suicide prevention education provided includes the following: Suicide risk factors Suicide prevention and interventions National Suicide Hotline telephone number [facility] assessment telephone number [facility] Emergency Assistance 911 [facility] and/or Residential Mobile Crisis Unit telephone number  Request made of family/significant other to: Remove weapons (e.g., guns, rifles, knives), all items previously/currently identified as safety concern.   Remove drugs/medications (over-the-counter, prescriptions, illicit drugs), all items previously/currently identified as a safety concern.  The family member/significant other verbalizes understanding of the suicide prevention education information provided.  The family member/significant other agrees to remove the items of safety concern listed above.  Carloyn Jaeger Grossman-Orr 12/31/2022, 1:46 PM

## 2022-12-31 NOTE — Group Note (Signed)
Date:  12/31/2022 Time:  11:53 AM  Group Topic/Focus:  Goals Group:   The focus of this group is to help patients establish daily goals to achieve during treatment and discuss how the patient can incorporate goal setting into their daily lives to aide in recovery. Orientation:   The focus of this group is to educate the patient on the purpose and policies of crisis stabilization and provide a format to answer questions about their admission.  The group details unit policies and expectations of patients while admitted.    Participation Level:  Active  Participation Quality:  Appropriate  Affect:  Appropriate  Cognitive:  Appropriate  Insight: Appropriate  Engagement in Group:  Engaged  Modes of Intervention:  Activity  Additional Comments:   Pt attended and participated in the Orientation/Goals group. Pt personal goal is to stay happy. Pt will focus on positives, attend groups, take all medications and show self-compassion.  Edmund Hilda Virlee Stroschein 12/31/2022, 11:53 AM

## 2022-12-31 NOTE — Plan of Care (Signed)
Nurse discussed anxiety, depression and coping skills with patient.  

## 2022-12-31 NOTE — Group Note (Signed)
Date:  12/31/2022 Time:  6:38 PM  Group Topic/Focus:  Dimensions of Wellness:   The focus of this group is to introduce the topic of wellness and discuss the role each dimension of wellness plays in total health.    Participation Level:  Did Not Attend  Participation Quality:   n/a  Affect:   n/a  Cognitive:   n/a  Insight: None  Engagement in Group:   n/a  Modes of Intervention:   n/a  Additional Comments:   Pt did not attend.  Edmund Hilda Guiliana Shor 12/31/2022, 6:38 PM

## 2022-12-31 NOTE — Group Note (Signed)
Date:  12/31/2022 Time:  6:59 PM  Group Topic/Focus:  Dimensions of Wellness:   The focus of this group is to introduce the topic of wellness and discuss the role each dimension of wellness plays in total health.    Participation Level:  Active  Participation Quality:  Appropriate  Affect:  Appropriate  Cognitive:  Appropriate  Insight: Appropriate  Engagement in Group:  Engaged  Modes of Intervention:  Activity and Discussion  Additional Comments:   Pt attended and participated in the Spiritual Wellness group.  Emily Park 12/31/2022, 6:59 PM

## 2022-12-31 NOTE — Progress Notes (Signed)
   12/31/22 2221  Psych Admission Type (Psych Patients Only)  Admission Status Voluntary  Psychosocial Assessment  Patient Complaints None  Eye Contact Fair  Facial Expression Anxious  Affect Appropriate to circumstance  Speech Logical/coherent  Interaction Assertive  Motor Activity Other (Comment) (WDL)  Appearance/Hygiene Unremarkable  Behavior Characteristics Appropriate to situation  Mood Anxious;Pleasant  Thought Process  Coherency WDL  Content WDL  Delusions None reported or observed  Perception WDL  Hallucination None reported or observed  Judgment WDL  Confusion None  Danger to Self  Current suicidal ideation? Denies  Agreement Not to Harm Self Yes  Description of Agreement verbal  Danger to Others  Danger to Others None reported or observed

## 2022-12-31 NOTE — Plan of Care (Addendum)
D:  Patient's self inventory sheet, patient sleeps good, sleep medication.  Good appetite, low energy level, good concentration.  Rated depression and hopeless 5, anxiety 6.  Denied withdrawals.  Denied SI.  Physical problems.  Physical pain denied.  Goal is to stay focused and happy.  A:  Medications administered per MD orders.  Emotional support and encouragement. Given patient. R:  Denied SI and HI.  Denied A/V hallucinations.   Safety maintained with 15 minute safety checks.

## 2022-12-31 NOTE — Group Note (Signed)
Date:  12/31/2022 Time:  4:41 PM  Group Topic/Focus:  Dimensions of Wellness:   The focus of this group is to introduce the topic of wellness and discuss the role each dimension of wellness plays in total health.    Participation Level:  Active  Participation Quality:  Appropriate  Affect:  Appropriate  Cognitive:  Appropriate  Insight: Appropriate  Engagement in Group:  Engaged  Modes of Intervention:  Activity and Discussion  Additional Comments:   Pt attended and participated in the Intellectual Wellness group.  Emily Park Hilda Davis Ambrosini 12/31/2022, 4:41 PM

## 2022-12-31 NOTE — [facility] Suicide Risk Assessment (Signed)
Clinical Social Work Note  About an hour after speaking with patient's husband about SPE, CSW received phone call from patient's stepmother Curt Bears [phone].  CSW informed her that no release has been signed and thus no information can be provided.  She was okay with just providing information for the team to consider.  She talked emotionally and repetitively for 20 minutes.  Basically, this stepmother has been in her life 40 years and is her "go to" person that patient calls when she is suicidal; stepmother is the person who talks her "down."  Patient has said for many years she wants to commit suicide.  She switches from being nice to being mean and argumentative in a flash.  Stepmother states husband will be unable to manage patient's medicines because he has to work or they will lose their home.  She also stated that if the patient threatens to harm herself if he does not give her what she wants, he will in fact give in because he does not like her to be angry with him.  Stepmother also said nobody in the family will come to pick the patient up if she is released before she is ready.  CSW explained the role of a crisis stabilization facility.  CSW explained that stepmother's # will be shared with the team, but that does not guarantee that there will be contact or that the plan will change.  Ambrose Mantle, LCSW 12/31/2022, 3:34 PM

## 2022-12-31 NOTE — [facility] Group Notes (Signed)
Adult Psychoeducational Group Note  Date:  12/31/2022 Time:  10:38 PM  Group Topic/Focus:  Wrap-Up Group:   The focus of this group is to help patients review their daily goal of treatment and discuss progress on daily workbooks.  Participation Level:  Active  Participation Quality:  Supportive  Affect:  Appropriate  Cognitive:  Appropriate  Insight: Appropriate  Engagement in Group:  Engaged  Modes of Intervention:  Support  Additional Comments:  Pt attended the evening group and responded to all discussion prompts from the Writer. Pt shared that today was a good day on the unit, the highlight of which was a visit from her son. "My baby boy came to visit me!" On the subject of goals for the coming week, Pt mentioned wanting to "stay focused on getting better" while also figuring out her discharge plan. "I have a great support system, so I'll be okay." Pt rated her day an 8 out of 10.  Christ Kick 12/31/2022, 10:38 PM

## 2023-01-01 LAB — POTASSIUM: Potassium: 3.9 mmol/L (ref 3.5–5.1)

## 2023-01-01 LAB — COMPREHENSIVE METABOLIC PANEL
ALT: 29 U/L (ref 0–44)
AST: 20 U/L (ref 15–41)
Albumin: 3.6 g/dL (ref 3.5–5.0)
Alkaline Phosphatase: 53 U/L (ref 38–126)
Anion gap: 9 (ref 5–15)
BUN: 18 mg/dL (ref 6–20)
CO2: 24 mmol/L (ref 22–32)
Calcium: 8.6 mg/dL — ABNORMAL LOW (ref 8.9–10.3)
Chloride: 102 mmol/L (ref 98–111)
Creatinine, Ser: 1.02 mg/dL — ABNORMAL HIGH (ref 0.44–1.00)
GFR, Estimated: >60 mL/min (ref >60)
Glucose, Bld: 95 mg/dL (ref 70–99)
Potassium: 3.9 mmol/L (ref 3.5–5.1)
Sodium: 135 mmol/L (ref 135–145)
Total Bilirubin: 0.5 mg/dL (ref 0.3–1.2)
Total Protein: 6.5 g/dL (ref 6.5–8.1)

## 2023-01-01 MED ORDER — TOPIRAMATE 100 MG PO TABS
100.0000 mg | ORAL_TABLET | Freq: Every day | ORAL | Status: DC
  Administered 2023-01-02 – 2023-01-04 (×3): 100 mg via ORAL
  Filled 2023-01-01 (×5): qty 1

## 2023-01-01 NOTE — Group Note (Signed)
Occupational Therapy Group Note  Group Topic:Coping Skills  Group Date: 01/01/2023 Start Time: 1430 End Time: 1500 Facilitators: Saidy Ormand G, OT   Group Description: Group encouraged increased engagement and participation through discussion and activity focused on "Coping Ahead." Patients were split up into teams and selected a card from a stack of positive coping strategies. Patients were instructed to act out/charade the coping skill for other peers to guess and receive points for their team. Discussion followed with a focus on identifying additional positive coping strategies and patients shared how they were going to cope ahead over the weekend while continuing hospitalization stay.  Therapeutic Goal(s): Identify positive vs negative coping strategies. Identify coping skills to be used during hospitalization vs coping skills outside of hospital/at home Increase participation in therapeutic group environment and promote engagement in treatment   Participation Level: Engaged   Participation Quality: Independent   Behavior: Appropriate   Speech/Thought Process: Relevant   Affect/Mood: Appropriate   Insight: Fair   Judgement: Fair      Modes of Intervention: Education  Patient Response to Interventions:  Attentive   Plan: Continue to engage patient in OT groups 2 - 3x/week.  01/01/2023  Katlyne Nishida G Chakia Counts, OT Jammie Clink, OT   

## 2023-01-01 NOTE — Plan of Care (Signed)
  Problem: Education: Goal: Emotional status will improve Outcome: Progressing Goal: Mental status will improve Outcome: Progressing   Problem: Health Behavior/Discharge Planning: Goal: Compliance with treatment plan for underlying cause of condition will improve Outcome: Progressing   

## 2023-01-01 NOTE — Progress Notes (Signed)
   01/01/23 1302  Psych Admission Type (Psych Patients Only)  Admission Status Voluntary  Psychosocial Assessment  Patient Complaints None  Eye Contact Fair  Facial Expression Anxious  Affect Appropriate to circumstance  Speech Logical/coherent  Interaction Assertive  Motor Activity Slow  Appearance/Hygiene Unremarkable  Behavior Characteristics Appropriate to situation  Mood Pleasant;Anxious  Thought Process  Coherency WDL  Content WDL  Delusions None reported or observed  Perception WDL  Hallucination None reported or observed  Judgment WDL  Confusion None  Danger to Self  Current suicidal ideation? Denies  Agreement Not to Harm Self Yes  Description of Agreement verbal  Danger to Others  Danger to Others None reported or observed

## 2023-01-01 NOTE — Group Note (Signed)
Recreation Therapy Group Note   Group Topic:Problem Solving  Group Date: 01/01/2023 Start Time: 0932 End Time: 0956 Facilitators: Tierra Divelbiss-McCall, LRT,CTRS Location: [address] Day[address]   Goal Area(s) Addresses:  Patient will effectively work with peer towards shared goal.  Patient will identify skills used to make activity successful.  Patient will identify how skills used during activity can be applied to reach post d/c goals.   Group Description: [employer]. In teams of 5-6, patients were given 12 craft pipe cleaners. Using the materials provided, patients were instructed to compete again the opposing team(s) to build the tallest free-standing structure from floor level. The activity was timed; difficulty increased by Clinical research associate as Production designer, theatre/television/film continued.  Systematically resources were removed with additional directions for example, placing one arm behind their back, working in silence, and shape stipulations. LRT facilitated post-activity discussion reviewing team processes and necessary communication skills involved in completion. Patients were encouraged to reflect how the skills utilized, or not utilized, in this activity can be incorporated to positively impact support systems post discharge.   Affect/Mood: N/A   Participation Level: Did not attend    Clinical Observations/Individualized Feedback:     Plan: Continue to engage patient in RT group sessions 2-3x/week.   Alexandro Line-McCall, LRT,CTRS  01/01/2023 1:18 PM

## 2023-01-01 NOTE — Progress Notes (Signed)
Chaplain received a consult that Emily Park wanted to meet following a significant suicide attempt.  She is grateful to be alive and feels that she has great support.  She is hopeful that her time here will bring healing and that she will have a second chance at life.  Her faith is important to her and her pastor has been in touch with her. Chaplain provided listening and emotional support.  Chaplain [employer], Bcc

## 2023-01-01 NOTE — Progress Notes (Signed)
[facility] MD Progress Note  01/01/2023 9:07 AM Emily Park  MRN:  [id number]   Reason for Admission:  The patient is a 52 year old female with no outpatient psychiatric notes or behavioral health admissions available for review. She has an outpatient psychiatrist who prescribes her Klonopin and Adderall. The patient has a past medical history of rectal prolapse, irritable bowel syndrome, and GERD. She was medically admitted on 4/24 after being found unresponsive. She was found to have a blood glucose of 22. Lab work showed high insulin and low C-peptide, consistent with exogenous insulin administration (in a patient who has no history of type I or type 2 diabetes).  She was transferred to the [facility] behavioral hospital on a voluntary basis.   Information obtained from 24-hour nursing report: The patient was noted to be cooperative and appropriate with peers and with staff.   Information Obtained Today During Patient Interview: The patient engages well in interview.  Her affect is bright.  She denies experiencing any side effects from the increased dose of Trintellix.  She is amenable to increasing her Topamax upwards towards her prior home dose.  She is able to list coping strategies for dealing with depression.  She is interested in an in person PHP program.  I discussed Garen Lah with the patient.  She denies experiencing any suicidal thoughts.  She reports appropriate sleep, appetite, and mood  Called the patient's husband, with the patient's consent to discuss medication safety.  He already has a safe with a combination the patient does not have access to.  He agrees to keep the medication in the safe and only dispense 2 days worth of medication at a time.   Principal Problem: MDD (major depressive disorder), recurrent severe, without psychosis (HCC) Diagnosis: Principal Problem:   MDD (major depressive disorder), recurrent severe, without psychosis (HCC)  Past Psychiatric History: See  H&P  Past Medical History:  Past Medical History:  Diagnosis Date   Abnormal TSH    ADD (attention deficit disorder)    Anemia    Anxiety    Arrhythmia    Decreased libido    Depression    Heart murmur    Hyperlipidemia    Hypokalemia    Migraine    Nephrolithiasis    Pap smear abnormality of cervix/human papillomavirus (HPV) positive    Sleep difficulties    SVT (supraventricular tachycardia)     Past Surgical History:  Procedure Laterality Date   ABLATION     Cardiac ablation, endometrial ablation   BREAST LUMPECTOMY     right breast   BREAST LUMPECTOMY Right    CARDIOVERSION     COLONOSCOPY WITH PROPOFOL N/A 05/16/2021   Procedure: COLONOSCOPY WITH PROPOFOL;  Surgeon: Charlott Rakes, MD;  Location: [facility];  Service: Endoscopy;  Laterality: N/A;   COLPOSCOPY     w/ cervical biopsy   DILITATION & CURRETTAGE/HYSTROSCOPY WITH NOVASURE ABLATION N/A 09/11/2014   Procedure: DILATATION & CURETTAGE/HYSTEROSCOPY WITH NOVASURE ABLATION;  Surgeon: Lenoard Aden, MD;  Location: [facility];  Service: Gynecology;  Laterality: N/A;   GIVENS CAPSULE STUDY N/A 05/16/2021   Procedure: GIVENS CAPSULE STUDY;  Surgeon: Charlott Rakes, MD;  Location: [facility];  Service: Endoscopy;  Laterality: N/A;   XI ROBOT ASSISTED RECTOPEXY N/A 07/07/2021   Procedure: XI ROBOT ASSISTED RECTOPEXY;  Surgeon: Karie Soda, MD;  Location: [facility];  Service: General;  Laterality: N/A;   XI ROBOTIC ASSISTED LOWER ANTERIOR RESECTION N/A 07/07/2021   Procedure: XI ROBOTIC ASSISTED LOW ANTERIOR  RECTOSIGMOID RESECTION WITH RIGID PROCTOSCOPY, TAP BLOCK;  Surgeon: Karie Soda, MD;  Location: [facility];  Service: General;  Laterality: N/A;   Family History:  Family History  Problem Relation Age of Onset   Hypothyroidism Mother    Depression Mother    Cancer Mother        thyroid   Thyroid disease Mother    Hypertension Father    Heart attack Father        Age 73   AAA (abdominal aortic aneurysm)  Father    Hypothyroidism Sister    Hyperthyroidism Sister    Diabetes Maternal Grandfather    Breast cancer Paternal Grandmother    Family Psychiatric  History: None reported Social History: Lives with husband at home, used to work as a Engineer, civil (consulting) currently unemployed, denies legal problems or pending court dates, denies having access to guns at home  Current Medications: Current Facility-Administered Medications  Medication Dose Route Frequency Provider Last Rate Last Admin   acetaminophen (TYLENOL) tablet 650 mg  650 mg Oral Q6H PRN Maryagnes Amos, FNP   650 mg at 01/01/23 0542   alum & mag hydroxide-simeth (MAALOX/MYLANTA) 200-200-20 MG/5ML suspension 30 mL  30 mL Oral Q4H PRN Maryagnes Amos, FNP   30 mL at 01/01/23 0543   clonazePAM (KLONOPIN) tablet 1 mg  1 mg Oral QHS Maryagnes Amos, FNP   1 mg at 12/31/22 2116   diphenhydrAMINE (BENADRYL) capsule 50 mg  50 mg Oral TID PRN Maryagnes Amos, FNP   50 mg at 12/30/22 2254   Or   diphenhydrAMINE (BENADRYL) injection 50 mg  50 mg Intramuscular TID PRN Maryagnes Amos, FNP       docusate sodium (COLACE) capsule 100 mg  100 mg Oral BID PRN Maryagnes Amos, FNP   100 mg at 12/29/22 0921   doxepin (SINEQUAN) capsule 75 mg  75 mg Oral QHS Maryagnes Amos, FNP   75 mg at 12/31/22 2116   hydrOXYzine (ATARAX) tablet 25 mg  25 mg Oral TID PRN Sarita Bottom, MD   25 mg at 12/31/22 2116   ipratropium-albuterol (DUONEB) 0.5-2.5 (3) MG/3ML nebulizer solution 3 mL  3 mL Nebulization Q4H PRN Starkes-Perry, Juel Burrow, FNP       linaclotide (LINZESS) capsule 145 mcg  145 mcg Oral QAC breakfast Maryagnes Amos, FNP   145 mcg at 01/01/23 0630   LORazepam (ATIVAN) tablet 2 mg  2 mg Oral TID PRN Maryagnes Amos, FNP       Or   LORazepam (ATIVAN) injection 2 mg  2 mg Intramuscular TID PRN Starkes-Perry, Juel Burrow, FNP       magnesium hydroxide (MILK OF MAGNESIA) suspension 30 mL  30 mL Oral Daily PRN  Maryagnes Amos, FNP   30 mL at 12/28/22 1839   pantoprazole (PROTONIX) EC tablet 40 mg  40 mg Oral Daily Maryagnes Amos, FNP   40 mg at 01/01/23 0749   polyvinyl alcohol (LIQUIFILM TEARS) 1.4 % ophthalmic solution 1 drop  1 drop Both Eyes PRN Maryagnes Amos, FNP   1 drop at 12/31/22 1259   potassium chloride SA (KLOR-CON M) CR tablet 20 mEq  20 mEq Oral BID Abbott Pao, Nadir, MD   20 mEq at 01/01/23 0749   topiramate (TOPAMAX) tablet 50 mg  50 mg Oral Daily Attiah, Nadir, MD   50 mg at 01/01/23 0749   vortioxetine HBr (TRINTELLIX) tablet 20 mg  20 mg Oral Daily Attiah, Nadir,  MD   20 mg at 01/01/23 0748    Lab Results:  Results for orders placed or performed during the hospital encounter of 12/28/22 (from the past 48 hour(s))  Comprehensive metabolic panel     Status: Abnormal   Collection Time: 12/31/22  6:54 AM  Result Value Ref Range   Sodium 136 135 - 145 mmol/L   Potassium 3.1 (L) 3.5 - 5.1 mmol/L   Chloride 103 98 - 111 mmol/L   CO2 24 22 - 32 mmol/L   Glucose, Bld 89 70 - 99 mg/dL    Comment: Glucose reference range applies only to samples taken after fasting for at least 8 hours.   BUN 18 6 - 20 mg/dL   Creatinine, Ser 1.61 (H) 0.44 - 1.00 mg/dL   Calcium 8.8 (L) 8.9 - 10.3 mg/dL   Total Protein 6.7 6.5 - 8.1 g/dL   Albumin 3.5 3.5 - 5.0 g/dL   AST 25 15 - 41 U/L   ALT 31 0 - 44 U/L   Alkaline Phosphatase 44 38 - 126 U/L   Total Bilirubin 0.3 0.3 - 1.2 mg/dL   GFR, Estimated 59 (L) >60 mL/min    Comment: (NOTE) Calculated using the CKD-EPI Creatinine Equation (2021)    Anion gap 9 5 - 15    Comment: Performed at [facility], [address], [place], [place] [address]  Magnesium     Status: None   Collection Time: 12/31/22  6:54 AM  Result Value Ref Range   Magnesium 2.1 1.7 - 2.4 mg/dL    Comment: Performed at [facility], [address], [place], [place] [address]  Comprehensive metabolic panel     Status:  Abnormal   Collection Time: 01/01/23  6:48 AM  Result Value Ref Range   Sodium 135 135 - 145 mmol/L   Potassium 3.9 3.5 - 5.1 mmol/L   Chloride 102 98 - 111 mmol/L   CO2 24 22 - 32 mmol/L   Glucose, Bld 95 70 - 99 mg/dL    Comment: Glucose reference range applies only to samples taken after fasting for at least 8 hours.   BUN 18 6 - 20 mg/dL   Creatinine, Ser 1.19 (H) 0.44 - 1.00 mg/dL   Calcium 8.6 (L) 8.9 - 10.3 mg/dL   Total Protein 6.5 6.5 - 8.1 g/dL   Albumin 3.6 3.5 - 5.0 g/dL   AST 20 15 - 41 U/L   ALT 29 0 - 44 U/L   Alkaline Phosphatase 53 38 - 126 U/L   Total Bilirubin 0.5 0.3 - 1.2 mg/dL   GFR, Estimated >14 >78 mL/min    Comment: (NOTE) Calculated using the CKD-EPI Creatinine Equation (2021)    Anion gap 9 5 - 15    Comment: Performed at [facility], [address], [place], [place] [address]    Blood Alcohol level:  Lab Results  Component Value Date   [facility] <10 12/21/2022   ETH <10 06/06/2022    Metabolic Disorder Labs: Lab Results  Component Value Date   HGBA1C 4.9 12/20/2022   MPG 93.93 12/20/2022   MPG 93.93 07/06/2021   No results found for: "PROLACTIN" Lab Results  Component Value Date   CHOL 208 (H) 02/24/2014   TRIG 81.0 02/24/2014   HDL 54.90 02/24/2014   CHOLHDL 4 02/24/2014   VLDL 16.2 02/24/2014   LDLCALC 137 (H) 02/24/2014   Psychiatric Specialty Exam: Physical Exam Constitutional:  Appearance: the patient is not toxic-appearing.  Pulmonary:     Effort: Pulmonary effort is normal.  Neurological:     General: No focal deficit present.     Mental Status: the patient is alert and oriented to person, place, and time.   Review of Systems  Respiratory:  Negative for shortness of breath.   Cardiovascular:  Negative for chest pain.  Gastrointestinal:  Negative for abdominal pain, constipation, diarrhea, nausea and vomiting.  Neurological:  Negative for headaches.      BP (!) 119/91 (BP Location: Left Arm)    Pulse 95   Temp 98.2 F (36.8 C) (Oral)   Resp 16   Ht 5\' 4"  (1.626 m)   Wt 59.4 kg   SpO2 100%   BMI 22.49 kg/m   General Appearance: Fairly Groomed  Eye Contact:  Good  Speech:  Clear and Coherent  Volume:  Normal  Mood:  Euthymic  Affect:  Congruent  Thought Process:  Coherent  Orientation:  Full (Time, Place, and Person)  Thought Content: Logical   Suicidal Thoughts:  No  Homicidal Thoughts:  No  Memory:  Immediate;   Good  Judgement:  fair  Insight:  fair  Psychomotor Activity:  Normal  Concentration:  Concentration: Good  Recall:  Good  Fund of Knowledge: Good  Language: Good  Akathisia:  No  Handed:  not assessed  AIMS (if indicated): not done  Assets:  Communication Skills Desire for Improvement Financial Resources/Insurance Housing Leisure Time Physical Health  ADL's:  Intact  Cognition: WNL  Sleep:  Fair     Treatment Plan Summary: Daily contact with patient to assess and evaluate symptoms and progress in treatment and Medication management  ASSESSMENT:  Diagnoses / Active Problems: Principal Problem: MDD (major depressive disorder), recurrent severe, without psychosis (HCC) Diagnosis: Principal Problem:   MDD (major depressive disorder), recurrent severe, without psychosis (HCC)   PLAN: Safety and Monitoring:  -- Voluntary admission to inpatient psychiatric unit for safety, stabilization and treatment  -- Daily contact with patient to assess and evaluate symptoms and progress in treatment  -- Patient's case to be discussed in multi-disciplinary team meeting  -- Observation Level : q15 minute checks  -- Vital signs:  q12 hours  -- Precautions: suicide, elopement, and assault  2. Medications:   Trintellix 20 mg daily (increased 5/6)  Continue Klonopin 1 mg at bedtime, home medication for anxiety and sleep  Titrate Atarax from 20 to 25 mg to use as needed for anxiety or sleep  Continue doxepin 75 mg daily at bedtime, home medication for  sleep  Continue Protonix 40 mg daily for GERD, home medication  Continue Linzess 145 mcg daily before breakfast for IBS, home medication  Continue k dur scheduled 20 meq twice daily, patient reports home dose 40 once daily for many years prescribed by GI Dr., will monitor potassium closely and adjust accordingly  Increase home Topamax to 100 mg daily (home dose of 150 mg daily)   The risks/benefits/side-effects/alternatives to this medication were discussed in detail with the patient and time was given for questions. The patient consents to medication trial.    -- Metabolic profile and EKG monitoring obtained while on an atypical antipsychotic (BMI: Lipid Panel: HbgA1c: QTc:)      3. Pertinent labs:  Hypokalemia to 2.7 on 5/4, now increased to 3.9 - Repeat potassium for 3 days in the evenings (will need to reassess after 5/8) - Patient is on 20 mEq twice daily  4. Group and Therapy: -- Encouraged patient to participate in unit milieu and in scheduled group therapies       -- Short Term Goals: Ability to identify changes in lifestyle to reduce recurrence of condition will improve, Ability to verbalize feelings will improve, Ability to disclose and discuss suicidal ideas, Ability to demonstrate self-control will improve, and Ability to identify and develop effective coping behaviors will improve  -- Long Term Goals: Improvement in symptoms so as ready for discharge  5. Discharge Planning:   -- Social work and case management to assist with discharge planning and identification of hospital follow-up needs prior to discharge  -- Estimated LOS: 5-7 days  -- Discharge Concerns: Need to establish a safety plan; Medication compliance and effectiveness  -- Discharge Goals: Return home with outpatient referrals for mental health follow-up including medication management/psychotherapy -Discussed with the patient's husband the need for keeping the patient's medication out of their control for the  foreseeable future.  This was agreed upon by the patient's husband. -Plan for referral to [facility]  Carlyn Reichert, MD 01/01/2023, 9:07 AM

## 2023-01-01 NOTE — Progress Notes (Signed)
   01/01/23 1945  Psych Admission Type (Psych Patients Only)  Admission Status Voluntary  Psychosocial Assessment  Patient Complaints Anxiety;Depression  Eye Contact Fair  Facial Expression Anxious  Affect Appropriate to circumstance  Speech Logical/coherent  Interaction Assertive  Motor Activity Other (Comment) (WDL)  Appearance/Hygiene Unremarkable  Behavior Characteristics Cooperative;Appropriate to situation  Mood Anxious;Pleasant  Thought Process  Coherency WDL  Content WDL  Delusions None reported or observed  Perception WDL  Hallucination None reported or observed  Judgment WDL  Confusion None  Danger to Self  Current suicidal ideation? Denies  Agreement Not to Harm Self Yes  Description of Agreement Verbal  Danger to Others  Danger to Others None reported or observed

## 2023-01-01 NOTE — BHH Group Notes (Signed)
Spirituality group facilitated by Kathleen Argue, BCC.  Group Description: Group focused on topic of hope. Patients participated in facilitated discussion around topic, connecting with one another around experiences and definitions for hope. Group members engaged with visual explorer photos, reflecting on what hope looks like for them today. Group engaged in discussion around how their definitions of hope are present today in hospital.  Modalities: Psycho-social ed, Adlerian, Narrative, MI  Patient Progress: Emily Park attended group and actively engaged and participated in group conversation and activities.  Comments demonstrated good insight and she was supportive of peers.

## 2023-01-02 LAB — POTASSIUM: Potassium: 3.5 mmol/L (ref 3.5–5.1)

## 2023-01-02 MED ORDER — VITAMIN D (ERGOCALCIFEROL) 1.25 MG (50000 UNIT) PO CAPS
50000.0000 [IU] | ORAL_CAPSULE | ORAL | Status: DC
  Administered 2023-01-02: 50000 [IU] via ORAL
  Filled 2023-01-02 (×2): qty 1

## 2023-01-02 NOTE — Group Note (Signed)
Date:  01/02/2023 Time:  10:54 AM  Group Topic/Focus:  Goals Group:   The focus of this group is to help patients establish daily goals to achieve during treatment and discuss how the patient can incorporate goal setting into their daily lives to aide in recovery. Orientation:   The focus of this group is to educate the patient on the purpose and policies of crisis stabilization and provide a format to answer questions about their admission.  The group details unit policies and expectations of patients while admitted.    Participation Level:  Active  Participation Quality:  Appropriate  Affect:  Appropriate  Cognitive:  Appropriate  Insight: Appropriate  Engagement in Group:  Engaged  Modes of Intervention:  Discussion  Additional Comments:  Patient attended group and was attentive the duration of it. Patient's goal was to attend all groups.   Fusaye Wachtel T Rhone Ozaki 01/02/2023, 10:54 AM

## 2023-01-02 NOTE — Progress Notes (Signed)
   01/02/23 0545  15 Minute Checks  Location Bedroom  Visual Appearance Calm  Behavior Sleeping  Sleep (Behavioral Health Patients Only)  Calculate sleep? (Click Yes once per 24 hr at 0600 safety check) Yes  Documented sleep last 24 hours 7.5    

## 2023-01-02 NOTE — Progress Notes (Signed)
   01/02/23 2200  Psych Admission Type (Psych Patients Only)  Admission Status Voluntary  Psychosocial Assessment  Patient Complaints Anxiety  Eye Contact Fair  Facial Expression Animated  Affect Appropriate to circumstance  Speech Logical/coherent  Interaction Assertive  Motor Activity Slow  Appearance/Hygiene Unremarkable  Behavior Characteristics Cooperative;Appropriate to situation  Mood Pleasant  Thought Process  Coherency WDL  Content WDL  Delusions None reported or observed  Perception WDL  Hallucination None reported or observed  Judgment WDL  Confusion None  Danger to Self  Current suicidal ideation? Denies  Agreement Not to Harm Self Yes  Description of Agreement verbal  Danger to Others  Danger to Others None reported or observed

## 2023-01-02 NOTE — Group Note (Signed)
Recreation Therapy Group Note   Group Topic:Animal Assisted Therapy   Group Date: 01/02/2023 Start Time: 0945 End Time: 1035 Facilitators: Arnez Stoneking-McCall, LRT,CTRS Location: [address] Dayroom   Animal-Assisted Activity (AAA) Program Checklist/Progress Notes Patient Eligibility Criteria Checklist & Daily Group note for Rec Tx Intervention  AAA/T Program Assumption of Risk Form signed by Patient/ or Parent Legal Guardian Yes  Patient understands his/her participation is voluntary Yes   Affect/Mood: N/A   Participation Level: Did not attend    Clinical Observations/Individualized Feedback:     Plan: Continue to engage patient in RT group sessions 2-3x/week.   Emily Park, LRT,CTRS 01/02/2023 2:19 PM

## 2023-01-02 NOTE — BHH Counselor (Signed)
CSW reached out to [facility] to get patient a scheduled PHP appointment. One of the admission coordinator, Efraim Kaufmann got patient demographic information from CSW and afterwards Efraim Kaufmann stated for patient to call her today @ 3:15 PM to complete a screening; once screening is completed and referral paperwork is sent, they will provide recommendation to CSW after assessment which can take about 4/5 hours. If patient meet criteria an appointment will be scheduled.

## 2023-01-02 NOTE — Progress Notes (Signed)
[facility] MD Progress Note  01/02/2023 8:50 AM AARTI SWIDERSKI  MRN:  [id number]  Reason for Admission:  The patient is a 52 year old female with no outpatient psychiatric notes or behavioral health admissions available for review. She has an outpatient psychiatrist who prescribes her Klonopin and Adderall. The patient has a past medical history of rectal prolapse, irritable bowel syndrome, and GERD. She was medically admitted on 4/24 after being found unresponsive. She was found to have a blood glucose of 22. Lab work showed high insulin and low C-peptide, consistent with exogenous insulin administration (in a patient who has no history of type I or type 2 diabetes).  She was transferred to the [facility] behavioral hospital on a voluntary basis.   Information obtained from 24-hour nursing report: The patient was cooperative and appropriate.   Information Obtained Today During Patient Interview: The patient reports good mood, appetite, and sleep. She denies suicidal and homicidal thoughts. She denies side effects from her medications.  Review of systems notable for a headache. May be related to increase antidepressant dose. Will monitor.   Brief CBT provided regarding coping with challenging emotions. Discussed her husband managing her medications and keeping them locked away and she was agreeable.   Principal Problem: MDD (major depressive disorder), recurrent severe, without psychosis (HCC) Diagnosis: Principal Problem:   MDD (major depressive disorder), recurrent severe, without psychosis (HCC)  Past Psychiatric History: See H&P  Past Medical History:  Past Medical History:  Diagnosis Date   Abnormal TSH    ADD (attention deficit disorder)    Anemia    Anxiety    Arrhythmia    Decreased libido    Depression    Heart murmur    Hyperlipidemia    Hypokalemia    Migraine    Nephrolithiasis    Pap smear abnormality of cervix/human papillomavirus (HPV) positive    Sleep difficulties    SVT  (supraventricular tachycardia)     Past Surgical History:  Procedure Laterality Date   ABLATION     Cardiac ablation, endometrial ablation   BREAST LUMPECTOMY     right breast   BREAST LUMPECTOMY Right    CARDIOVERSION     COLONOSCOPY WITH PROPOFOL N/A 05/16/2021   Procedure: COLONOSCOPY WITH PROPOFOL;  Surgeon: Charlott Rakes, MD;  Location: [facility];  Service: Endoscopy;  Laterality: N/A;   COLPOSCOPY     w/ cervical biopsy   DILITATION & CURRETTAGE/HYSTROSCOPY WITH NOVASURE ABLATION N/A 09/11/2014   Procedure: DILATATION & CURETTAGE/HYSTEROSCOPY WITH NOVASURE ABLATION;  Surgeon: Lenoard Aden, MD;  Location: [facility];  Service: Gynecology;  Laterality: N/A;   GIVENS CAPSULE STUDY N/A 05/16/2021   Procedure: GIVENS CAPSULE STUDY;  Surgeon: Charlott Rakes, MD;  Location: [facility];  Service: Endoscopy;  Laterality: N/A;   XI ROBOT ASSISTED RECTOPEXY N/A 07/07/2021   Procedure: XI ROBOT ASSISTED RECTOPEXY;  Surgeon: Karie Soda, MD;  Location: [facility];  Service: General;  Laterality: N/A;   XI ROBOTIC ASSISTED LOWER ANTERIOR RESECTION N/A 07/07/2021   Procedure: XI ROBOTIC ASSISTED LOW ANTERIOR RECTOSIGMOID RESECTION WITH RIGID PROCTOSCOPY, TAP BLOCK;  Surgeon: Karie Soda, MD;  Location: [facility];  Service: General;  Laterality: N/A;   Family History:  Family History  Problem Relation Age of Onset   Hypothyroidism Mother    Depression Mother    Cancer Mother        thyroid   Thyroid disease Mother    Hypertension Father    Heart attack Father  Age 32   AAA (abdominal aortic aneurysm) Father    Hypothyroidism Sister    Hyperthyroidism Sister    Diabetes Maternal Grandfather    Breast cancer Paternal Grandmother    Family Psychiatric  History: None reported Social History: Lives with husband at home, used to work as a Engineer, civil (consulting) currently unemployed, denies legal problems or pending court dates, denies having access to guns at home  Current Medications: Current  Facility-Administered Medications  Medication Dose Route Frequency Provider Last Rate Last Admin   acetaminophen (TYLENOL) tablet 650 mg  650 mg Oral Q6H PRN Maryagnes Amos, FNP   650 mg at 01/01/23 2106   alum & mag hydroxide-simeth (MAALOX/MYLANTA) 200-200-20 MG/5ML suspension 30 mL  30 mL Oral Q4H PRN Maryagnes Amos, FNP   30 mL at 01/01/23 0543   clonazePAM (KLONOPIN) tablet 1 mg  1 mg Oral QHS Maryagnes Amos, FNP   1 mg at 01/01/23 2106   diphenhydrAMINE (BENADRYL) capsule 50 mg  50 mg Oral TID PRN Maryagnes Amos, FNP   50 mg at 12/30/22 2254   Or   diphenhydrAMINE (BENADRYL) injection 50 mg  50 mg Intramuscular TID PRN Maryagnes Amos, FNP       docusate sodium (COLACE) capsule 100 mg  100 mg Oral BID PRN Maryagnes Amos, FNP   100 mg at 01/02/23 0623   doxepin (SINEQUAN) capsule 75 mg  75 mg Oral QHS Maryagnes Amos, FNP   75 mg at 01/01/23 2106   hydrOXYzine (ATARAX) tablet 25 mg  25 mg Oral TID PRN Sarita Bottom, MD   25 mg at 01/02/23 0816   ipratropium-albuterol (DUONEB) 0.5-2.5 (3) MG/3ML nebulizer solution 3 mL  3 mL Nebulization Q4H PRN Starkes-Perry, Juel Burrow, FNP       linaclotide (LINZESS) capsule 145 mcg  145 mcg Oral QAC breakfast Maryagnes Amos, FNP   145 mcg at 01/02/23 1610   LORazepam (ATIVAN) tablet 2 mg  2 mg Oral TID PRN Maryagnes Amos, FNP       Or   LORazepam (ATIVAN) injection 2 mg  2 mg Intramuscular TID PRN Starkes-Perry, Juel Burrow, FNP       magnesium hydroxide (MILK OF MAGNESIA) suspension 30 mL  30 mL Oral Daily PRN Maryagnes Amos, FNP   30 mL at 12/28/22 1839   pantoprazole (PROTONIX) EC tablet 40 mg  40 mg Oral Daily Maryagnes Amos, FNP   40 mg at 01/02/23 9604   polyvinyl alcohol (LIQUIFILM TEARS) 1.4 % ophthalmic solution 1 drop  1 drop Both Eyes PRN Maryagnes Amos, FNP   1 drop at 12/31/22 1259   potassium chloride SA (KLOR-CON M) CR tablet 20 mEq  20 mEq Oral BID  Abbott Pao, Nadir, MD   20 mEq at 01/02/23 0814   topiramate (TOPAMAX) tablet 100 mg  100 mg Oral Daily Attiah, Nadir, MD   100 mg at 01/02/23 0815   vortioxetine HBr (TRINTELLIX) tablet 20 mg  20 mg Oral Daily Attiah, Nadir, MD   20 mg at 01/02/23 5409    Lab Results:  Results for orders placed or performed during the hospital encounter of 12/28/22 (from the past 48 hour(s))  Comprehensive metabolic panel     Status: Abnormal   Collection Time: 01/01/23  6:48 AM  Result Value Ref Range   Sodium 135 135 - 145 mmol/L   Potassium 3.9 3.5 - 5.1 mmol/L   Chloride 102 98 - 111 mmol/L  CO2 24 22 - 32 mmol/L   Glucose, Bld 95 70 - 99 mg/dL    Comment: Glucose reference range applies only to samples taken after fasting for at least 8 hours.   BUN 18 6 - 20 mg/dL   Creatinine, Ser 1.61 (H) 0.44 - 1.00 mg/dL   Calcium 8.6 (L) 8.9 - 10.3 mg/dL   Total Protein 6.5 6.5 - 8.1 g/dL   Albumin 3.6 3.5 - 5.0 g/dL   AST 20 15 - 41 U/L   ALT 29 0 - 44 U/L   Alkaline Phosphatase 53 38 - 126 U/L   Total Bilirubin 0.5 0.3 - 1.2 mg/dL   GFR, Estimated >09 >60 mL/min    Comment: (NOTE) Calculated using the CKD-EPI Creatinine Equation (2021)    Anion gap 9 5 - 15    Comment: Performed at [facility], [address] [place], [place] [address]  Potassium     Status: None   Collection Time: 01/01/23  6:24 PM  Result Value Ref Range   Potassium 3.9 3.5 - 5.1 mmol/L    Comment: Performed at [facility], [address], [place], [place] [address]    Blood Alcohol level:  Lab Results  Component Value Date   [facility] <10 12/21/2022   ETH <10 06/06/2022    Metabolic Disorder Labs: Lab Results  Component Value Date   HGBA1C 4.9 12/20/2022   MPG 93.93 12/20/2022   MPG 93.93 07/06/2021   No results found for: "PROLACTIN" Lab Results  Component Value Date   CHOL 208 (H) 02/24/2014   TRIG 81.0 02/24/2014   HDL 54.90 02/24/2014   CHOLHDL 4 02/24/2014   VLDL 16.2  02/24/2014   LDLCALC 137 (H) 02/24/2014   Psychiatric Specialty Exam: Physical Exam Constitutional:      Appearance: the patient is not toxic-appearing.  Pulmonary:     Effort: Pulmonary effort is normal.  Neurological:     General: No focal deficit present.     Mental Status: the patient is alert and oriented to person, place, and time.   Review of Systems  Respiratory:  Negative for shortness of breath.   Cardiovascular:  Negative for chest pain.  Gastrointestinal:  Negative for abdominal pain, constipation, diarrhea, nausea and vomiting.  Neurological:  Negative for headaches.      BP 116/88 (BP Location: Right Arm)   Pulse 94   Temp 98.4 F (36.9 C) (Oral)   Resp 16   Ht 5\' 4"  (1.626 m)   Wt 59.4 kg   SpO2 100%   BMI 22.49 kg/m   General Appearance: Fairly Groomed  Eye Contact:  Good  Speech:  Clear and Coherent  Volume:  Normal  Mood:  Euthymic  Affect:  Congruent  Thought Process:  Coherent  Orientation:  Full (Time, Place, and Person)  Thought Content: Logical   Suicidal Thoughts:  No  Homicidal Thoughts:  No  Memory:  Immediate;   Good  Judgement:  fair  Insight:  fair  Psychomotor Activity:  Normal  Concentration:  Concentration: Good  Recall:  Good  Fund of Knowledge: Good  Language: Good  Akathisia:  No  Handed:  not assessed  AIMS (if indicated): not done  Assets:  Communication Skills Desire for Improvement Financial Resources/Insurance Housing Leisure Time Physical Health  ADL's:  Intact  Cognition: WNL  Sleep:  Fair     Treatment Plan Summary: Daily contact with patient to assess and evaluate symptoms and progress in treatment and  Medication management  ASSESSMENT:  Diagnoses / Active Problems: Principal Problem: MDD (major depressive disorder), recurrent severe, without psychosis (HCC) Diagnosis: Principal Problem:   MDD (major depressive disorder), recurrent severe, without psychosis (HCC)   PLAN: Safety and Monitoring:  --  Voluntary admission to inpatient psychiatric unit for safety, stabilization and treatment  -- Daily contact with patient to assess and evaluate symptoms and progress in treatment  -- Patient's case to be discussed in multi-disciplinary team meeting  -- Observation Level : q15 minute checks  -- Vital signs:  q12 hours  -- Precautions: suicide, elopement, and assault  2. Medications:   Continue Trintellix 20 mg daily (increased 5/6) for depression  Continue Klonopin 1 mg at bedtime, home medication for anxiety and sleep  Titrate Atarax from 20 to 25 mg to use as needed for anxiety or sleep  Continue doxepin 75 mg daily at bedtime, home medication for sleep  Continue Protonix 40 mg daily for GERD, home medication  Continue Linzess 145 mcg daily before breakfast for IBS, home medication  Continue k dur scheduled 20 meq twice daily, patient reports home dose 40 once daily for many years prescribed by GI Dr., will monitor potassium closely and adjust accordingly  Increased home Topamax to 100 mg daily (home dose of 150 mg daily)   The risks/benefits/side-effects/alternatives to this medication were discussed in detail with the patient and time was given for questions. The patient consents to medication trial.    -- Metabolic profile and EKG monitoring obtained while on an atypical antipsychotic (BMI: Lipid Panel: HbgA1c: QTc:)      3. Pertinent labs:  Hypokalemia to 2.7 on 5/4, now increased to 3.9 - Repeat potassium for 3 days in the evenings (will need to reassess after 5/8) - Patient is on 20 mEq twice daily    4. Group and Therapy: -- Encouraged patient to participate in unit milieu and in scheduled group therapies       -- Short Term Goals: Ability to identify changes in lifestyle to reduce recurrence of condition will improve, Ability to verbalize feelings will improve, Ability to disclose and discuss suicidal ideas, Ability to demonstrate self-control will improve, and Ability to  identify and develop effective coping behaviors will improve  -- Long Term Goals: Improvement in symptoms so as ready for discharge  5. Discharge Planning:   -- Social work and case management to assist with discharge planning and identification of hospital follow-up needs prior to discharge  -- Estimated LOS: 5-7 days  -- Discharge Concerns: Need to establish a safety plan; Medication compliance and effectiveness  -- Discharge Goals: Return home with outpatient referrals for mental health follow-up including medication management/psychotherapy -Discussed with the patient's husband the need for keeping the patient's medication out of their control for the foreseeable future.  This was agreed upon by the patient's husband. -Plan for referral to [facility]  Carlyn Reichert, MD 01/02/2023, 8:50 AM

## 2023-01-02 NOTE — Progress Notes (Signed)
Pt A & O X4. Presents anxious with fixed smile, fair eye contact, assertive with logical speech on interactions. Denies SI, HI, AVH and pain. Rates her anxiety 6/10 and depression 5/10 "I worry about everything I have to do when I get home. I'm just like my mother, she worried all the time". Verbalized concerns about headache related to lower dose of Topamax "I will wait and see because the doctor increased my dose to 100 mg, so I will see". PRN Vistaril 25 mg and Tylenol 650 mg both PO given for anxiety and headache 6/10 with desired effect when reassessed.  Safety checks maintained at Q 15 minutes intervals without incident. Attended scheduled groups, participated in activities. Tolerated meals and fluids well. Off unit for meals and recreational activities without issues.

## 2023-01-02 NOTE — Group Note (Signed)
LCSW Group Therapy Note  Group Date: 01/02/2023 Start Time: 1100 End Time: 1150   Type of Therapy and Topic:  Group Therapy - Healthy vs Unhealthy Coping Skills Dealing with Depression  Participation Level:  Minimal   Description of Group The focus of this group was to determine what unhealthy coping techniques typically are used by group members and what healthy coping techniques would be helpful in coping with various problems. Patients were guided in becoming aware of the differences between healthy and unhealthy coping techniques. Patients were asked to identify 2-3 healthy coping skills they would like to learn to use more effectively.  Therapeutic Goals Patients learned that coping is what human beings do all day long to deal with various situations in their lives Patients defined and discussed healthy vs unhealthy coping techniques Patients identified their preferred coping techniques and identified whether these were healthy or unhealthy Patients determined 2-3 healthy coping skills they would like to become more familiar with and use more often. Patients provided support and ideas to each other   Summary of Patient Progress:  Patient came to group about 35 minutes in and was attentive to her peers as they shared their positive ways they cope depression. Patient did accept the provided worksheets and read along. Patient was respectful and remained in group until it was over.   Therapeutic Modalities Cognitive Behavioral Therapy Motivational Interviewing  Beather Arbour 01/02/2023  3:18 PM

## 2023-01-03 ENCOUNTER — Other Ambulatory Visit (HOSPITAL_COMMUNITY): Payer: Self-pay

## 2023-01-03 ENCOUNTER — Telehealth (HOSPITAL_COMMUNITY): Payer: Self-pay | Admitting: Pharmacy Technician

## 2023-01-03 ENCOUNTER — Encounter (HOSPITAL_COMMUNITY): Payer: Self-pay

## 2023-01-03 LAB — POTASSIUM: Potassium: 3.9 mmol/L (ref 3.5–5.1)

## 2023-01-03 NOTE — Telephone Encounter (Signed)
Pharmacy Patient Advocate Encounter  Insurance verification completed.    The patient is insured through [employer]   The patient is currently admitted and ran test claims for the following: Trintellix.  Copays and coinsurance results were relayed to Inpatient clinical team.

## 2023-01-03 NOTE — BHH Group Notes (Signed)
BHH Group Notes:  (Nursing/MHT/Case Management/Adjunct)  Date:  01/03/2023  Time:  1:16 AM  Type of Therapy:   Wrap- up group  Participation Level:  Active  Participation Quality:  Appropriate  Affect:  Appropriate  Cognitive:  Appropriate  Insight:  Appropriate  Engagement in Group:  Engaged  Modes of Intervention:  Education  Summary of Progress/Problems: Pt goal to be happy, Pt reports she is happy. Day was 9/10.  Emily Park 01/03/2023, 1:16 AM

## 2023-01-03 NOTE — Plan of Care (Signed)
Nurse discussed anxiety, depression and coping skills with patient.  

## 2023-01-03 NOTE — BH IP Treatment Plan (Signed)
Interdisciplinary Treatment and Diagnostic Plan Update  01/03/2023 Time of Session: 8:50 AM (UPDATE)  Emily Park MRN: [id number]  Principal Diagnosis: MDD (major depressive disorder), recurrent severe, without psychosis (HCC)  Secondary Diagnoses: Principal Problem:   MDD (major depressive disorder), recurrent severe, without psychosis (HCC)   Current Medications:  Current Facility-Administered Medications  Medication Dose Route Frequency Provider Last Rate Last Admin   acetaminophen (TYLENOL) tablet 650 mg  650 mg Oral Q6H PRN Maryagnes Amos, FNP   650 mg at 01/02/23 1015   alum & mag hydroxide-simeth (MAALOX/MYLANTA) 200-200-20 MG/5ML suspension 30 mL  30 mL Oral Q4H PRN Maryagnes Amos, FNP   30 mL at 01/02/23 2058   clonazePAM (KLONOPIN) tablet 1 mg  1 mg Oral QHS Maryagnes Amos, FNP   1 mg at 01/02/23 2055   diphenhydrAMINE (BENADRYL) capsule 50 mg  50 mg Oral TID PRN Maryagnes Amos, FNP   50 mg at 12/30/22 2254   Or   diphenhydrAMINE (BENADRYL) injection 50 mg  50 mg Intramuscular TID PRN Maryagnes Amos, FNP       docusate sodium (COLACE) capsule 100 mg  100 mg Oral BID PRN Maryagnes Amos, FNP   100 mg at 01/02/23 0623   doxepin (SINEQUAN) capsule 75 mg  75 mg Oral QHS Maryagnes Amos, FNP   75 mg at 01/02/23 2056   hydrOXYzine (ATARAX) tablet 25 mg  25 mg Oral TID PRN Sarita Bottom, MD   25 mg at 01/03/23 0005   ipratropium-albuterol (DUONEB) 0.5-2.5 (3) MG/3ML nebulizer solution 3 mL  3 mL Nebulization Q4H PRN Starkes-Perry, Juel Burrow, FNP       linaclotide (LINZESS) capsule 145 mcg  145 mcg Oral QAC breakfast Maryagnes Amos, FNP   145 mcg at 01/03/23 0612   LORazepam (ATIVAN) tablet 2 mg  2 mg Oral TID PRN Maryagnes Amos, FNP       Or   LORazepam (ATIVAN) injection 2 mg  2 mg Intramuscular TID PRN Starkes-Perry, Juel Burrow, FNP       magnesium hydroxide (MILK OF MAGNESIA) suspension 30 mL  30 mL Oral Daily PRN  Maryagnes Amos, FNP   30 mL at 12/28/22 1839   pantoprazole (PROTONIX) EC tablet 40 mg  40 mg Oral Daily Maryagnes Amos, FNP   40 mg at 01/03/23 0751   polyvinyl alcohol (LIQUIFILM TEARS) 1.4 % ophthalmic solution 1 drop  1 drop Both Eyes PRN Maryagnes Amos, FNP   1 drop at 12/31/22 1259   potassium chloride SA (KLOR-CON M) CR tablet 20 mEq  20 mEq Oral BID Abbott Pao, Nadir, MD   20 mEq at 01/03/23 0751   topiramate (TOPAMAX) tablet 100 mg  100 mg Oral Daily Attiah, Nadir, MD   100 mg at 01/03/23 5621   Vitamin D (Ergocalciferol) (DRISDOL) 1.25 MG (50000 UNIT) capsule 50,000 Units  50,000 Units Oral Q7 days Carlyn Reichert, MD   50,000 Units at 01/02/23 1630   vortioxetine HBr (TRINTELLIX) tablet 20 mg  20 mg Oral Daily Attiah, Nadir, MD   20 mg at 01/03/23 3086   PTA Medications: Medications Prior to Admission  Medication Sig Dispense Refill Last Dose   Ascorbic Acid (VITAMIN C PO) Take 1 tablet by mouth every morning.      Cholecalciferol (VITAMIN D3) 50 MCG (2000 UT) capsule Take 2,000 Units by mouth daily.      clonazePAM (KLONOPIN) 1 MG tablet Take 1 mg by mouth at  bedtime.      doxepin (SINEQUAN) 75 MG capsule Take 75 mg by mouth at bedtime.      FLUoxetine (PROZAC) 20 MG capsule Take 60 mg by mouth every morning.      hydrocortisone (ANUSOL-HC) 2.5 % rectal cream Apply 1 Application topically 4 (four) times daily as needed for hemorrhoids. 30 g 0    [EXPIRED] hydrocortisone (CORTEF) 10 MG tablet Take 5 tablets (50 mg total) by mouth daily for 2 days. 2 tablet 0    linaclotide (LINZESS) 145 MCG CAPS capsule Take 1 capsule (145 mcg total) by mouth daily before breakfast. 30 capsule 0    loratadine (CLARITIN) 10 MG tablet Take 1 tablet (10 mg total) by mouth daily as needed for allergies. 30 tablet 0    Mouthwashes (MOUTH RINSE) LIQD solution 15 mLs by Mouth Rinse route as needed (for oral care). 118 mL 0    Multiple Vitamin (MULTIVITAMIN WITH MINERALS) TABS tablet Take  1 tablet by mouth every morning.      pantoprazole (PROTONIX) 40 MG tablet Take 40 mg by mouth 2 (two) times daily.      polyethylene glycol (MIRALAX / GLYCOLAX) 17 g packet Take 17 g by mouth 2 (two) times daily. (Patient taking differently: Take 17 g by mouth daily as needed for moderate constipation.) 14 each 0    polyvinyl alcohol (LIQUIFILM TEARS) 1.4 % ophthalmic solution Place 1 drop into both eyes as needed for dry eyes. 15 mL 0    potassium chloride SA (KLOR-CON M) 20 MEQ tablet Take 2 tablets (40 mEq total) by mouth daily for 14 days. 14 tablet 0    sodium chloride (OCEAN) 0.65 % SOLN nasal spray Place 1 spray into both nostrils as needed for congestion (nose irritation). 37.5 mL 0     Patient Stressors: Financial difficulties   Medication change or noncompliance   Occupational concerns   Traumatic event    Patient Strengths: Capable of independent living  Communication skills  Religious Affiliation  Supportive family/friends  Work skills   Treatment Modalities: Medication Management, Group therapy, Case management,  1 to 1 session with clinician, Psychoeducation, Recreational therapy.   Physician Treatment Plan for Primary Diagnosis: MDD (major depressive disorder), recurrent severe, without psychosis (HCC) Long Term Goal(s): Improvement in symptoms so as ready for discharge   Short Term Goals: Ability to identify changes in lifestyle to reduce recurrence of condition will improve Ability to verbalize feelings will improve Ability to disclose and discuss suicidal ideas Ability to demonstrate self-control will improve Ability to identify and develop effective coping behaviors will improve  Medication Management: Evaluate patient's response, side effects, and tolerance of medication regimen.  Therapeutic Interventions: 1 to 1 sessions, Unit Group sessions and Medication administration.  Evaluation of Outcomes: Progressing  Physician Treatment Plan for Secondary  Diagnosis: Principal Problem:   MDD (major depressive disorder), recurrent severe, without psychosis (HCC)  Long Term Goal(s): Improvement in symptoms so as ready for discharge   Short Term Goals: Ability to identify changes in lifestyle to reduce recurrence of condition will improve Ability to verbalize feelings will improve Ability to disclose and discuss suicidal ideas Ability to demonstrate self-control will improve Ability to identify and develop effective coping behaviors will improve     Medication Management: Evaluate patient's response, side effects, and tolerance of medication regimen.  Therapeutic Interventions: 1 to 1 sessions, Unit Group sessions and Medication administration.  Evaluation of Outcomes: Progressing   RN Treatment Plan for Primary Diagnosis: MDD (major  depressive disorder), recurrent severe, without psychosis (HCC) Long Term Goal(s): Knowledge of disease and therapeutic regimen to maintain health will improve  Short Term Goals: Ability to remain free from injury will improve, Ability to verbalize frustration and anger appropriately will improve, Ability to participate in decision making will improve, Ability to verbalize feelings will improve, Ability to identify and develop effective coping behaviors will improve, and Compliance with prescribed medications will improve  Medication Management: RN will administer medications as ordered by provider, will assess and evaluate patient's response and provide education to patient for prescribed medication. RN will report any adverse and/or side effects to prescribing provider.  Therapeutic Interventions: 1 on 1 counseling sessions, Psychoeducation, Medication administration, Evaluate responses to treatment, Monitor vital signs and CBGs as ordered, Perform/monitor CIWA, COWS, AIMS and Fall Risk screenings as ordered, Perform wound care treatments as ordered.  Evaluation of Outcomes: Progressing   LCSW Treatment Plan  for Primary Diagnosis: MDD (major depressive disorder), recurrent severe, without psychosis (HCC) Long Term Goal(s): Safe transition to appropriate next level of care at discharge, Engage patient in therapeutic group addressing interpersonal concerns.  Short Term Goals: Engage patient in aftercare planning with referrals and resources, Increase social support, Increase emotional regulation, Facilitate acceptance of mental health diagnosis and concerns, Identify triggers associated with mental health/substance abuse issues, and Increase skills for wellness and recovery  Therapeutic Interventions: Assess for all discharge needs, 1 to 1 time with Social worker, Explore available resources and support systems, Assess for adequacy in community support network, Educate family and significant other(s) on suicide prevention, Complete Psychosocial Assessment, Interpersonal group therapy.  Evaluation of Outcomes: Progressing   Progress in Treatment: Attending groups: Yes. Participating in groups: Yes. Taking medication as prescribed: Yes. Toleration medication: Yes. Family/Significant other contact made: Yes; Janessa Wuertz, spouse, [phone] Patient understands diagnosis: Yes. Discussing patient identified problems/goals with staff: Yes. Medical problems stabilized or resolved: Yes. Denies suicidal/homicidal ideation: Yes. Issues/concerns per patient self-inventory: No.    New problem(s) identified: No, Describe:  None Reported   New Short Term/Long Term Goal(s): medication stabilization, elimination of SI thoughts, development of comprehensive mental wellness plan.     Patient Goals:  Medication Management   Discharge Plan or Barriers: Patient recently admitted. CSW will continue to follow and assess for appropriate referrals and possible discharge planning.     Reason for Continuation of Hospitalization: Depression Medication stabilization   Estimated Length of Stay: 1-2 Days  Last 3  [place] Suicide Severity Risk Score: Flowsheet Row Ad[facility] (Current) [facility]Ad[facility] (Discharged) from 12/20/2022 in [facility]  SURGICAL ED to Hosp-Admission (Discharged) from 06/06/2022 in [facility]  C-SSRS RISK CATEGORY High Risk High Risk No Risk       Last PHQ 2/9 Scores:    11/22/2022    8:06 AM 10/03/2022    8:48 AM  Depression screen PHQ 2/9  Decreased Interest 1 0  Down, Depressed, Hopeless 1 0  PHQ - 2 Score 2 0  Altered sleeping 1   Tired, decreased energy 1   Change in appetite 0   Feeling bad or failure about yourself  1   Trouble concentrating 1   Moving slowly or fidgety/restless 1   Suicidal thoughts 0   PHQ-9 Score 7   Difficult doing work/chores Not difficult at all     Scribe for Treatment Team: Isabella Bowens, LCSWA 01/03/2023 9:12 AM

## 2023-01-03 NOTE — Group Note (Signed)
Date:  01/03/2023 Time:  12:11 PM  Group Topic/Focus:  Goals Group:   The focus of this group is to help patients establish daily goals to achieve during treatment and discuss how the patient can incorporate goal setting into their daily lives to aide in recovery.    Participation Level:  Active  Participation Quality:  Attentive  Affect:  Appropriate  Cognitive:  Appropriate  Insight: Appropriate  Engagement in Group:  Engaged  Modes of Intervention:  Discussion  Additional Comments:     Reymundo Poll 01/03/2023, 12:11 PM

## 2023-01-03 NOTE — Progress Notes (Signed)
[facility] MD Progress Note  01/03/2023 9:11 AM Emily Park  MRN:  [id number]  Reason for Admission:  The patient is a 52 year old female with no outpatient psychiatric notes or behavioral health admissions available for review. She has an outpatient psychiatrist who prescribes her Klonopin and Adderall. The patient has a past medical history of rectal prolapse, irritable bowel syndrome, and GERD. She was medically admitted on 4/24 after being found unresponsive. She was found to have a blood glucose of 22. Lab work showed high insulin and low C-peptide, consistent with exogenous insulin administration (in a patient who has no history of type I or type 2 diabetes).  She was transferred to the [facility] behavioral hospital on a voluntary basis.   Information obtained from 24-hour nursing report: The patient was cooperative and appropriate.   Information Obtained Today During Patient Interview: Today the patient reports resolution of her headache that she had yesterday.  The patient is amenable to discharge tomorrow, date previously discussed with her husband and the patient.  She has been given a PHP appointment date and her Trintellix is covered by her insurance.  I discussed with her the need for her to follow-up with her primary care doctor soon as possible after discharge, given her hypokalemia and the severity of the condition.  She expressed understanding.  LCSW to follow-up regarding PCP appointment.  Principal Problem: MDD (major depressive disorder), recurrent severe, without psychosis (HCC) Diagnosis: Principal Problem:   MDD (major depressive disorder), recurrent severe, without psychosis (HCC)  Past Psychiatric History: See H&P  Past Medical History:  Past Medical History:  Diagnosis Date   Abnormal TSH    ADD (attention deficit disorder)    Anemia    Anxiety    Arrhythmia    Decreased libido    Depression    Heart murmur    Hyperlipidemia    Hypokalemia    Migraine     Nephrolithiasis    Pap smear abnormality of cervix/human papillomavirus (HPV) positive    Sleep difficulties    SVT (supraventricular tachycardia)     Past Surgical History:  Procedure Laterality Date   ABLATION     Cardiac ablation, endometrial ablation   BREAST LUMPECTOMY     right breast   BREAST LUMPECTOMY Right    CARDIOVERSION     COLONOSCOPY WITH PROPOFOL N/A 05/16/2021   Procedure: COLONOSCOPY WITH PROPOFOL;  Surgeon: Charlott Rakes, MD;  Location: [facility] END[facility];  Service: Endoscopy;  Laterality: N/A;   COLPOSCOPY     w/ cervical biopsy   DILITATION & CURRETTAGE/HYSTROSCOPY WITH NOVASURE ABLATION N/A 09/11/2014   Procedure: DILATATION & CURETTAGE/HYSTEROSCOPY WITH NOVASURE ABLATION;  Surgeon: Lenoard Aden, MD;  Location: [facility];  Service: Gynecology;  Laterality: N/A;   GIVENS CAPSULE STUDY N/A 05/16/2021   Procedure: GIVENS CAPSULE STUDY;  Surgeon: Charlott Rakes, MD;  Location: [facility];  Service: Endoscopy;  Laterality: N/A;   XI ROBOT ASSISTED RECTOPEXY N/A 07/07/2021   Procedure: XI ROBOT ASSISTED RECTOPEXY;  Surgeon: Karie Soda, MD;  Location: [facility];  Service: General;  Laterality: N/A;   XI ROBOTIC ASSISTED LOWER ANTERIOR RESECTION N/A 07/07/2021   Procedure: XI ROBOTIC ASSISTED LOW ANTERIOR RECTOSIGMOID RESECTION WITH RIGID PROCTOSCOPY, TAP BLOCK;  Surgeon: Karie Soda, MD;  Location: [facility];  Service: General;  Laterality: N/A;   Family History:  Family History  Problem Relation Age of Onset   Hypothyroidism Mother    Depression Mother    Cancer Mother  thyroid   Thyroid disease Mother    Hypertension Father    Heart attack Father        Age 5   AAA (abdominal aortic aneurysm) Father    Hypothyroidism Sister    Hyperthyroidism Sister    Diabetes Maternal Grandfather    Breast cancer Paternal Grandmother    Family Psychiatric  History: None reported Social History: Lives with husband at home, used to work as a Engineer, civil (consulting) currently  unemployed, denies legal problems or pending court dates, denies having access to guns at home  Current Medications: Current Facility-Administered Medications  Medication Dose Route Frequency Provider Last Rate Last Admin   acetaminophen (TYLENOL) tablet 650 mg  650 mg Oral Q6H PRN Maryagnes Amos, FNP   650 mg at 01/02/23 1015   alum & mag hydroxide-simeth (MAALOX/MYLANTA) 200-200-20 MG/5ML suspension 30 mL  30 mL Oral Q4H PRN Maryagnes Amos, FNP   30 mL at 01/02/23 2058   clonazePAM (KLONOPIN) tablet 1 mg  1 mg Oral QHS Maryagnes Amos, FNP   1 mg at 01/02/23 2055   diphenhydrAMINE (BENADRYL) capsule 50 mg  50 mg Oral TID PRN Maryagnes Amos, FNP   50 mg at 12/30/22 2254   Or   diphenhydrAMINE (BENADRYL) injection 50 mg  50 mg Intramuscular TID PRN Maryagnes Amos, FNP       docusate sodium (COLACE) capsule 100 mg  100 mg Oral BID PRN Maryagnes Amos, FNP   100 mg at 01/02/23 0623   doxepin (SINEQUAN) capsule 75 mg  75 mg Oral QHS Maryagnes Amos, FNP   75 mg at 01/02/23 2056   hydrOXYzine (ATARAX) tablet 25 mg  25 mg Oral TID PRN Sarita Bottom, MD   25 mg at 01/03/23 0005   ipratropium-albuterol (DUONEB) 0.5-2.5 (3) MG/3ML nebulizer solution 3 mL  3 mL Nebulization Q4H PRN Starkes-Perry, Juel Burrow, FNP       linaclotide (LINZESS) capsule 145 mcg  145 mcg Oral QAC breakfast Maryagnes Amos, FNP   145 mcg at 01/03/23 0612   LORazepam (ATIVAN) tablet 2 mg  2 mg Oral TID PRN Maryagnes Amos, FNP       Or   LORazepam (ATIVAN) injection 2 mg  2 mg Intramuscular TID PRN Starkes-Perry, Juel Burrow, FNP       magnesium hydroxide (MILK OF MAGNESIA) suspension 30 mL  30 mL Oral Daily PRN Maryagnes Amos, FNP   30 mL at 12/28/22 1839   pantoprazole (PROTONIX) EC tablet 40 mg  40 mg Oral Daily Maryagnes Amos, FNP   40 mg at 01/03/23 0751   polyvinyl alcohol (LIQUIFILM TEARS) 1.4 % ophthalmic solution 1 drop  1 drop Both Eyes PRN  Maryagnes Amos, FNP   1 drop at 12/31/22 1259   potassium chloride SA (KLOR-CON M) CR tablet 20 mEq  20 mEq Oral BID Abbott Pao, Nadir, MD   20 mEq at 01/03/23 0751   topiramate (TOPAMAX) tablet 100 mg  100 mg Oral Daily Attiah, Nadir, MD   100 mg at 01/03/23 1610   Vitamin D (Ergocalciferol) (DRISDOL) 1.25 MG (50000 UNIT) capsule 50,000 Units  50,000 Units Oral Q7 days Carlyn Reichert, MD   50,000 Units at 01/02/23 1630   vortioxetine HBr (TRINTELLIX) tablet 20 mg  20 mg Oral Daily Abbott Pao, Nadir, MD   20 mg at 01/03/23 9604    Lab Results:  Results for orders placed or performed during the hospital encounter of 12/28/22 (from  the past 48 hour(s))  Potassium     Status: None   Collection Time: 01/01/23  6:24 PM  Result Value Ref Range   Potassium 3.9 3.5 - 5.1 mmol/L    Comment: Performed at [facility], [address], [place], [place] [address]  Potassium     Status: None   Collection Time: 01/02/23  6:21 PM  Result Value Ref Range   Potassium 3.5 3.5 - 5.1 mmol/L    Comment: Performed at [facility], [address], [place], [place] [address]    Blood Alcohol level:  Lab Results  Component Value Date   [facility] <10 12/21/2022   ETH <10 06/06/2022    Metabolic Disorder Labs: Lab Results  Component Value Date   HGBA1C 4.9 12/20/2022   MPG 93.93 12/20/2022   MPG 93.93 07/06/2021   No results found for: "PROLACTIN" Lab Results  Component Value Date   CHOL 208 (H) 02/24/2014   TRIG 81.0 02/24/2014   HDL 54.90 02/24/2014   CHOLHDL 4 02/24/2014   VLDL 16.2 02/24/2014   LDLCALC 137 (H) 02/24/2014   Psychiatric Specialty Exam: Physical Exam Constitutional:      Appearance: the patient is not toxic-appearing.  Pulmonary:     Effort: Pulmonary effort is normal.  Neurological:     General: No focal deficit present.     Mental Status: the patient is alert and oriented to person, place, and time.   Review of Systems  Respiratory:   Negative for shortness of breath.   Cardiovascular:  Negative for chest pain.  Gastrointestinal:  Negative for abdominal pain, constipation, diarrhea, nausea and vomiting.  Neurological:  Negative for headaches.      BP (!) 111/93 (BP Location: Right Arm)   Pulse 90   Temp 97.8 F (36.6 C) (Oral)   Resp 16   Ht 5\' 4"  (1.626 m)   Wt 59.4 kg   SpO2 100%   BMI 22.49 kg/m   General Appearance: Fairly Groomed  Eye Contact:  Good  Speech:  Clear and Coherent  Volume:  Normal  Mood:  Euthymic  Affect:  Congruent  Thought Process:  Coherent  Orientation:  Full (Time, Place, and Person)  Thought Content: Logical   Suicidal Thoughts:  No  Homicidal Thoughts:  No  Memory:  Immediate;   Good  Judgement:  fair  Insight:  fair  Psychomotor Activity:  Normal  Concentration:  Concentration: Good  Recall:  Good  Fund of Knowledge: Good  Language: Good  Akathisia:  No  Handed:  not assessed  AIMS (if indicated): not done  Assets:  Communication Skills Desire for Improvement Financial Resources/Insurance Housing Leisure Time Physical Health  ADL's:  Intact  Cognition: WNL  Sleep:  Fair     Treatment Plan Summary: Daily contact with patient to assess and evaluate symptoms and progress in treatment and Medication management  ASSESSMENT:  Diagnoses / Active Problems: Principal Problem: MDD (major depressive disorder), recurrent severe, without psychosis (HCC) Diagnosis: Principal Problem:   MDD (major depressive disorder), recurrent severe, without psychosis (HCC)   PLAN: Safety and Monitoring:  -- Voluntary admission to inpatient psychiatric unit for safety, stabilization and treatment  -- Daily contact with patient to assess and evaluate symptoms and progress in treatment  -- Patient's case to be discussed in multi-disciplinary team meeting  -- Observation Level : q15 minute checks  -- Vital signs:  q12 hours  -- Precautions: suicide, elopement, and assault  2.  Medications:  Continue Trintellix 20 mg daily (increased 5/6) for depression  Continue Klonopin 1 mg at bedtime, home medication for anxiety and sleep  Titrate Atarax from 20 to 25 mg to use as needed for anxiety or sleep  Continue doxepin 75 mg daily at bedtime, home medication for sleep  Continue Protonix 40 mg daily for GERD, home medication  Continue Linzess 145 mcg daily before breakfast for IBS, home medication  Continue k dur scheduled 20 meq twice daily, patient reports home dose 40 once daily for many years prescribed by GI Dr., will monitor potassium closely and adjust accordingly  Increased home Topamax to 100 mg daily on 5/6 (home dose of 150 mg daily)   The risks/benefits/side-effects/alternatives to this medication were discussed in detail with the patient and time was given for questions. The patient consents to medication trial.    -- Metabolic profile and EKG monitoring obtained while on an atypical antipsychotic (BMI: Lipid Panel: HbgA1c: QTc:)      3. Pertinent labs:  Hypokalemia to 2.7 on 5/4, now increased to 3.9 - Repeat potassium for 3 days in the evenings (will need to reassess after 5/8) - Patient is on 20 mEq twice daily    4. Group and Therapy: -- Encouraged patient to participate in unit milieu and in scheduled group therapies       -- Short Term Goals: Ability to identify changes in lifestyle to reduce recurrence of condition will improve, Ability to verbalize feelings will improve, Ability to disclose and discuss suicidal ideas, Ability to demonstrate self-control will improve, and Ability to identify and develop effective coping behaviors will improve  -- Long Term Goals: Improvement in symptoms so as ready for discharge  5. Discharge Planning:   -- Social work and case management to assist with discharge planning and identification of hospital follow-up needs prior to discharge  -- Estimated LOS: 5-7 days  -- Discharge Concerns: Need to establish a  safety plan; Medication compliance and effectiveness  -- Discharge Goals: Return home with outpatient referrals for mental health follow-up including medication management/psychotherapy -Discussed with the patient's husband the need for keeping the patient's medication out of their control for the foreseeable future.  This was agreed upon by the patient's husband. -PHP follow-up set with [facility] program  Carlyn Reichert, MD 01/03/2023, 9:12 AM

## 2023-01-03 NOTE — Progress Notes (Signed)
Emily Park did not attend wrap up NA group

## 2023-01-03 NOTE — Progress Notes (Signed)
D:  Patient's self inventory sheet, patient has fair sleep, sleep medication not helpful.  Good appetite, low energy level, good concentration.  Rated depression and hopeless 4, anxiety 6.  Denied withdrawals.  Denied SI.  Denied physical problems.  Denied physical pain.  Goal is continue working on discharge goals with MD and SW.  Be Happy.  Stay focused, take meds and participate.  Does have discharge plans.   A:  Medications administered per MD orders.  Emotional support and encouragement given. R:  Denied SI and HI, contracts for safety.  Denied A/V hallucinations.  Safety maintained with 15 minute checks.

## 2023-01-03 NOTE — Group Note (Signed)
Recreation Therapy Group Note   Group Topic:Team Building  Group Date: 01/03/2023 Start Time: 0934 End Time: 1010 Facilitators: Minyon Billiter-McCall, LRT,CTRS Location: [address] Dayroom   Goal Area(s) Addresses:  Patient will effectively work with peer towards shared goal.  Patient will identify skills used to make activity successful.  Patient will identify how skills used during activity can be used to reach post d/c goals.   Group Description: Landing Pad. In teams of 3-5, patients were given 12 plastic drinking straws and an equal length of masking tape. Using the materials provided, patients were asked to build a landing pad to catch a golf ball dropped from approximately 5 feet in the air. All materials were required to be used by the team in their design. LRT facilitated post-activity discussion.   Affect/Mood: Appropriate   Participation Level: Engaged   Participation Quality: Independent   Behavior: Appropriate   Speech/Thought Process: Focused   Insight: Good   Judgement: Good   Modes of Intervention: STEM Activity   Patient Response to Interventions:  Engaged   Education Outcome:  Acknowledges education   Clinical Observations/Individualized Feedback: Pt attended and participated in group session.    Plan: Continue to engage patient in RT group sessions 2-3x/week.   Yolande Skoda-McCall, LRT,CTRS 01/03/2023 1:33 PM

## 2023-01-03 NOTE — BHH Group Notes (Signed)
Spiritual care group on grief and loss facilitated by Chaplain Katy Giovanny Dugal, Bcc  Group Goal: Support / Education around grief and loss  Members engage in facilitated group support and psycho-social education.  Group Description:  Following introductions and group rules, group members engaged in facilitated group dialogue and support around topic of loss, with particular support around experiences of loss in their lives. Group Identified types of loss (relationships / self / things) and identified patterns, circumstances, and changes that precipitate losses. Reflected on thoughts / feelings around loss, normalized grief responses, and recognized variety in grief experience. Group encouraged individual reflection on safe space and on the coping skills that they are already utilizing.  Group drew on Adlerian / Rogerian and narrative framework  Patient Progress: Did not attend.  

## 2023-01-03 NOTE — TOC Benefit Eligibility Note (Signed)
Patient Product/process development scientist completed.    The patient is currently admitted and upon discharge could be taking Trintellix 20 mg tablets.  The current 30 day co-pay is $0.00.   The patient is insured through [employer]   This test claim was processed through [facility]- copay amounts may vary at other pharmacies due to pharmacy/plan contracts, or as the patient moves through the different stages of their insurance plan.  Roland Earl, CPHT Pharmacy Patient Advocate Specialist [facility] Patient Advocate Team Direct Number: [phone]  Fax: [phone]

## 2023-01-04 MED ORDER — POTASSIUM CHLORIDE CRYS ER 20 MEQ PO TBCR: 20.0000 meq | EXTENDED_RELEASE_TABLET | Freq: Two times a day (BID) | ORAL | 0 refills | Status: DC

## 2023-01-04 MED ORDER — HYDROXYZINE HCL 25 MG PO TABS: 25.0000 mg | ORAL_TABLET | Freq: Three times a day (TID) | ORAL | 0 refills | Status: DC | PRN

## 2023-01-04 MED ORDER — TOPIRAMATE 100 MG PO TABS: 100.0000 mg | ORAL_TABLET | Freq: Every day | ORAL | 0 refills | Status: DC

## 2023-01-04 MED ORDER — VORTIOXETINE HBR 20 MG PO TABS: 20.0000 mg | ORAL_TABLET | Freq: Every day | ORAL | 0 refills | Status: DC

## 2023-01-04 NOTE — Progress Notes (Signed)
   01/03/23 2130  Psych Admission Type (Psych Patients Only)  Admission Status Voluntary  Psychosocial Assessment  Patient Complaints Anxiety;Depression  Eye Contact Fair  Facial Expression Anxious  Affect Appropriate to circumstance  Speech Logical/coherent  Interaction Assertive  Motor Activity Slow  Appearance/Hygiene Unremarkable  Behavior Characteristics Cooperative;Appropriate to situation  Mood Anxious  Thought Process  Coherency WDL  Content WDL  Delusions None reported or observed  Perception WDL  Hallucination None reported or observed  Judgment WDL  Confusion None  Danger to Self  Current suicidal ideation? Denies  Agreement Not to Harm Self Yes  Description of Agreement Verbal contract  Danger to Others  Danger to Others None reported or observed

## 2023-01-04 NOTE — Progress Notes (Signed)
   01/04/23 0630  15 Minute Checks  Location Bedroom  Visual Appearance Calm  Behavior Composed  Sleep (Behavioral Health Patients Only)  Calculate sleep? (Click Yes once per 24 hr at 0600 safety check) Yes  Documented sleep last 24 hours 7.75

## 2023-01-04 NOTE — Progress Notes (Signed)
D:  Patient denied SI and HI, contracts for safety.  Denied A/V hallucinations.  Denied pain. A:  Medications administered per MD orders.  Emotional support and encouragement given patient. R:  Safety maintained with 15 minute checks.  

## 2023-01-04 NOTE — Discharge Summary (Signed)
Physician Discharge Summary Note  Patient:  Emily Park is an 52 y.o., female MRN:  [id number] DOB:  [birth year] Patient phone:  [phone] (home)  Patient address:   [address] [place] [address],  Total Time spent with patient: 15 min  Date of Admission:  12/28/2022 Date of Discharge: 01/04/2023  Reason for Admission:   The patient is a 52 year old female with no outpatient psychiatric notes or behavioral health admissions available for review. She has an outpatient psychiatrist who prescribes her Klonopin and Adderall. The patient has a past medical history of rectal prolapse, irritable bowel syndrome, and GERD. She was medically admitted on 4/24 after being found unresponsive. She was found to have a blood glucose of 22. Lab work showed high insulin and low C-peptide, consistent with exogenous insulin administration (in a patient who has no history of type I or type 2 diabetes).  She was transferred to the [facility] behavioral hospital on a voluntary basis.   Principal Problem: MDD (major depressive disorder), recurrent severe, without psychosis (HCC) Discharge Diagnoses: Principal Problem:   MDD (major depressive disorder), recurrent severe, without psychosis (HCC)    Past Psychiatric History: See H and P  Past Medical History:  Past Medical History:  Diagnosis Date   Abnormal TSH    ADD (attention deficit disorder)    Anemia    Anxiety    Arrhythmia    Decreased libido    Depression    Heart murmur    Hyperlipidemia    Hypokalemia    Migraine    Nephrolithiasis    Pap smear abnormality of cervix/human papillomavirus (HPV) positive    Sleep difficulties    SVT (supraventricular tachycardia)     Past Surgical History:  Procedure Laterality Date   ABLATION     Cardiac ablation, endometrial ablation   BREAST LUMPECTOMY     right breast   BREAST LUMPECTOMY Right    CARDIOVERSION     COLONOSCOPY WITH PROPOFOL N/A 05/16/2021   Procedure: COLONOSCOPY  WITH PROPOFOL;  Surgeon: Charlott Rakes, MD;  Location: [facility];  Service: Endoscopy;  Laterality: N/A;   COLPOSCOPY     w/ cervical biopsy   DILITATION & CURRETTAGE/HYSTROSCOPY WITH NOVASURE ABLATION N/A 09/11/2014   Procedure: DILATATION & CURETTAGE/HYSTEROSCOPY WITH NOVASURE ABLATION;  Surgeon: Lenoard Aden, MD;  Location: [facility];  Service: Gynecology;  Laterality: N/A;   GIVENS CAPSULE STUDY N/A 05/16/2021   Procedure: GIVENS CAPSULE STUDY;  Surgeon: Charlott Rakes, MD;  Location: [facility];  Service: Endoscopy;  Laterality: N/A;   XI ROBOT ASSISTED RECTOPEXY N/A 07/07/2021   Procedure: XI ROBOT ASSISTED RECTOPEXY;  Surgeon: Karie Soda, MD;  Location: [facility];  Service: General;  Laterality: N/A;   XI ROBOTIC ASSISTED LOWER ANTERIOR RESECTION N/A 07/07/2021   Procedure: XI ROBOTIC ASSISTED LOW ANTERIOR RECTOSIGMOID RESECTION WITH RIGID PROCTOSCOPY, TAP BLOCK;  Surgeon: Karie Soda, MD;  Location: [facility];  Service: General;  Laterality: N/A;   Family History:  Family History  Problem Relation Age of Onset   Hypothyroidism Mother    Depression Mother    Cancer Mother        thyroid   Thyroid disease Mother    Hypertension Father    Heart attack Father        Age 55   AAA (abdominal aortic aneurysm) Father    Hypothyroidism Sister    Hyperthyroidism Sister    Diabetes Maternal Grandfather    Breast cancer Paternal Grandmother    Family Psychiatric  History: See H and P Social History:  Social History   Substance and Sexual Activity  Alcohol Use Not Currently   Alcohol/week: 2.0 standard drinks of alcohol   Types: 2 Glasses of wine per week     Social History   Substance and Sexual Activity  Drug Use Not Currently    Social History   Socioeconomic History   Marital status: Married    Spouse name: Not on file   Number of children: 2   Years of education: college   Highest education level: Not on file  Occupational History   Occupation: Charity fundraiser with  Engineer, manufacturing systems: [employer]   Tobacco Use   Smoking status: Never    Passive exposure: Current   Smokeless tobacco: Never  Vaping Use   Vaping Use: Never used  Substance and Sexual Activity   Alcohol use: Not Currently    Alcohol/week: 2.0 standard drinks of alcohol    Types: 2 Glasses of wine per week   Drug use: Not Currently   Sexual activity: Yes    Partners: Male    Comment: Married  Other Topics Concern   Not on file  Social History Narrative   Lives at home with two sons.   Right-handed.   60 ounces of tea and soda per day.   Social Determinants of Health   Financial Resource Strain: Not on file  Food Insecurity: Food Insecurity Present (12/28/2022)   Hunger Vital Sign    Worried About Running Out of Food in the Last Year: Sometimes true    Ran Out of Food in the Last Year: Sometimes true  Transportation Needs: No Transportation Needs (12/28/2022)   PRAPARE - Administrator, Civil Service (Medical): No    Lack of Transportation (Non-Medical): No  Physical Activity: Not on file  Stress: Not on file  Social Connections: Not on file    Hospital Course:    During the patient's hospitalization, patient had extensive initial psychiatric evaluation, and follow-up psychiatric evaluations every day.   Upon evaluation, psychiatric diagnoses were given as follows:  Major depressive disorder Generalized anxiety disorder   Patient's psychiatric medications were adjusted on admission:  Restart Trintellix at 10 mg daily (patient was previously on this medication a few weeks ago) Discontinue home Prozac Continue home Klonopin 1 mg nightly and doxepin 75 mg nightly   During the hospitalization, other adjustments were made to the patient's psychiatric medication regimen:  Trintellix was increased to 20 mg daily, it was felt that her previous dose of 5 or 10 mg was insufficient to treat her depressive symptoms   Gradually, patient started adjusting to milieu.    Patient's care was discussed during the interdisciplinary team meeting every day during the hospitalization.   The patient denied having side effects to prescribed psychiatric medication.   The patient reports their target psychiatric symptoms of depression responded well to the psychiatric medications, and the patient reports overall benefit other psychiatric hospitalization. Supportive psychotherapy was provided to the patient. The patient also participated in regular group therapy while admitted.    Labs were reviewed with the patient, and abnormal results were discussed with the patient.   The patient denied having suicidal thoughts more than 48 hours prior to discharge.  Patient denies having homicidal thoughts.  Patient denies having auditory hallucinations.  Patient denies any visual hallucinations.  Patient denies having paranoid thoughts.   The patient is able to verbalize their individual safety plan to this provider.  It is recommended to the patient to continue psychiatric medications as prescribed, after discharge from the hospital.     It is recommended to the patient to follow up with your outpatient psychiatric provider and PCP.   Discussed with the patient, the impact of alcohol, drugs, tobacco have been there overall psychiatric and medical wellbeing, and total abstinence from substance use was recommended the patient.     Physical Findings: AIMS:  Facial and Oral Movements Muscles of Facial Expression: None, normal Lips and Perioral Area: None, normal Jaw: None, normal Tongue: None, normal, Extremity Movements Upper (arms, wrists, hands, fingers): None, normal Lower (legs, knees, ankles, toes): None, normal,  Trunk Movements Neck, shoulders, hips: None, normal,  Overall Severity Severity of abnormal movements (highest score from questions above): None, normal Incapacitation due to abnormal movements: None, normal Patient's awareness of abnormal movements  (rate only patient's report): No Awareness,  Dental Status Current problems with teeth and/or dentures?: No Does patient usually wear dentures?: No   Psychiatric Specialty Exam: Physical Exam Constitutional:      Appearance: the patient is not toxic-appearing.  Pulmonary:     Effort: Pulmonary effort is normal.  Neurological:     General: No focal deficit present.     Mental Status: the patient is alert and oriented to person, place, and time.   Review of Systems  Respiratory:  Negative for shortness of breath.   Cardiovascular:  Negative for chest pain.  Gastrointestinal:  Negative for abdominal pain, constipation, diarrhea, nausea and vomiting.  Neurological:  Negative for headaches.      BP (!) 126/96 (BP Location: Right Arm)   Pulse 98   Temp 98.2 F (36.8 C) (Oral)   Resp 16   Ht 5\' 4"  (1.626 m)   Wt 59.4 kg   SpO2 100%   BMI 22.49 kg/m   General Appearance: Fairly Groomed  Eye Contact:  Good  Speech:  Clear and Coherent  Volume:  Normal  Mood:  Euthymic  Affect:  Congruent  Thought Process:  Coherent  Orientation:  Full (Time, Place, and Person)  Thought Content: Logical   Suicidal Thoughts:  No  Homicidal Thoughts:  No  Memory:  Immediate;   Good  Judgement:  fair  Insight:  fair  Psychomotor Activity:  Normal  Concentration:  Concentration: Good  Recall:  Good  Fund of Knowledge: Good  Language: Good  Akathisia:  No  Handed:  not assessed  AIMS (if indicated): not done  Assets:  Communication Skills Desire for Improvement Financial Resources/Insurance Housing Leisure Time Physical Health  ADL's:  Intact  Cognition: WNL  Sleep:  Fair      Social History   Tobacco Use  Smoking Status Never   Passive exposure: Current  Smokeless Tobacco Never   Tobacco Cessation:  N/A, patient does not currently use tobacco products   Blood Alcohol level:  Lab Results  Component Value Date   ETH <10 12/21/2022   ETH <10 06/06/2022    Metabolic  Disorder Labs:  Lab Results  Component Value Date   HGBA1C 4.9 12/20/2022   MPG 93.93 12/20/2022   MPG 93.93 07/06/2021   No results found for: "PROLACTIN" Lab Results  Component Value Date   CHOL 208 (H) 02/24/2014   TRIG 81.0 02/24/2014   HDL 54.90 02/24/2014   CHOLHDL 4 02/24/2014   VLDL 16.2 02/24/2014   LDLCALC 137 (H) 02/24/2014    See Psychiatric Specialty Exam and Suicide Risk Assessment completed by Attending Physician prior  to discharge.  Discharge destination: self-care  Is patient on multiple antipsychotic therapies at discharge:  no Has Patient had three or more failed trials of antipsychotic monotherapy by history:  no  Recommended Plan for Multiple Antipsychotic Therapies: NA  Discharge Instructions     Diet - low sodium heart healthy   Complete by: As directed    Increase activity slowly   Complete by: As directed       Allergies as of 01/04/2023       Reactions   Zithromax [azithromycin] Rash        Medication List     STOP taking these medications    FLUoxetine 20 MG capsule Commonly known as: PROZAC   hydrocortisone 10 MG tablet Commonly known as: CORTEF   mouth rinse Liqd solution   sodium chloride 0.65 % Soln nasal spray Commonly known as: OCEAN       TAKE these medications      Indication  clonazePAM 1 MG tablet Commonly known as: KLONOPIN Take 1 mg by mouth at bedtime.  Indication: Feeling Anxious   doxepin 75 MG capsule Commonly known as: SINEQUAN Take 75 mg by mouth at bedtime.  Indication: Feeling Anxious   hydrocortisone 2.5 % rectal cream Commonly known as: ANUSOL-HC Apply 1 Application topically 4 (four) times daily as needed for hemorrhoids.  Indication: rectal prolapse   hydrOXYzine 25 MG tablet Commonly known as: ATARAX Take 1 tablet (25 mg total) by mouth 3 (three) times daily as needed for anxiety (sleep).  Indication: Feeling Anxious   linaclotide 145 MCG Caps capsule Commonly known as:  LINZESS Take 1 capsule (145 mcg total) by mouth daily before breakfast.  Indication: Constipation caused by Irritable Bowel Syndrome   loratadine 10 MG tablet Commonly known as: CLARITIN Take 1 tablet (10 mg total) by mouth daily as needed for allergies.  Indication: Allergic Conjunctivitis   multivitamin with minerals Tabs tablet Take 1 tablet by mouth every morning.  Indication: supplementation   pantoprazole 40 MG tablet Commonly known as: PROTONIX Take 40 mg by mouth 2 (two) times daily.  Indication: Gastroesophageal Reflux Disease   polyethylene glycol 17 g packet Commonly known as: MIRALAX / GLYCOLAX Take 17 g by mouth 2 (two) times daily. What changed:  when to take this reasons to take this  Indication: Constipation   polyvinyl alcohol 1.4 % ophthalmic solution Commonly known as: LIQUIFILM TEARS Place 1 drop into both eyes as needed for dry eyes.  Indication: dry eyes   potassium chloride SA 20 MEQ tablet Commonly known as: KLOR-CON M Take 1 tablet (20 mEq total) by mouth 2 (two) times daily for 14 days. What changed:  how much to take when to take this  Indication: Low Amount of Potassium in the Blood   topiramate 100 MG tablet Commonly known as: TOPAMAX Take 1 tablet (100 mg total) by mouth daily. Start taking on: Jan 05, 2023  Indication: Migraine Headache   VITAMIN C PO Take 1 tablet by mouth every morning.  Indication: Supplementation   Vitamin D3 50 MCG (2000 UT) capsule Take 2,000 Units by mouth daily.  Indication: Vitamin D Deficiency   vortioxetine HBr 20 MG Tabs tablet Commonly known as: TRINTELLIX Take 1 tablet (20 mg total) by mouth daily. Start taking on: Jan 05, 2023  Indication: Major Depressive Disorder        Follow-up Information     [facility] Follow up on 01/09/2023.   Specialty: Behavioral Health Why: You  are scheduled for an assessment for the PHP on Friday, 5/14 at 10:00 am. This  appointment will last approximately one hour and will be virtual via [employer] Please download the [employer] Teams app prior to the appointment. If you need to cancel or reschedule, please call [phone] and leave a voicemail with your name, date of birth, and phone number Contact information: [address] [place] [address] [phone]        Plotnikov, Georgina Quint, MD Follow up on 01/11/2023.   Specialty: Internal Medicine Why: You have a scheduled PCP appointment with this proovider, next thursday @ 10:40 AM. Contact information: [address] [place] [address] [phone]                 Follow-up recommendations:  Activity as tolerated. Diet as recommended by PCP. Keep all scheduled follow-up appointments as recommended.  Patient is instructed to take all prescribed medications as recommended. Report any side effects or adverse reactions to your outpatient psychiatrist. Patient is instructed to abstain from alcohol and illegal drugs while on prescription medications. In the event of worsening symptoms, patient is instructed to call the crisis hotline, 911, or go to the nearest emergency department for evaluation and treatment.  Prescriptions given at discharge. Patient agreeable to plan. Given opportunity to ask questions. Appears to feel comfortable with discharge.  Patient is also instructed prior to discharge to: Take all medications as prescribed by mental healthcare provider. Report any adverse effects and or reactions from the medicines to outpatient provider promptly. Patient has been instructed & cautioned: To not engage in alcohol and or illegal drug use while on prescription medicines. In the event of worsening symptoms,  patient is instructed to call the crisis hotline, 911 and or go to the nearest ED for appropriate evaluation and treatment of symptoms. To follow-up with primary care provider for other medical issues,  concerns and or health care needs  The patient was evaluated each day by a clinical provider to ascertain response to treatment. Improvement was noted by the patient's report of decreasing symptoms, improved sleep and appetite, affect, medication tolerance, behavior, and participation in unit programming.  Patient was asked each day to complete a self inventory noting mood, mental status, pain, new symptoms, anxiety and concerns.  Patient responded well to medication and being in a therapeutic and supportive environment. Positive and appropriate behavior was noted and the patient was motivated for recovery. The patient worked closely with the treatment team and case manager to develop a discharge plan with appropriate goals. Coping skills, problem solving as well as relaxation therapies were also part of the unit programming.  By the day of discharge patient was in much improved condition than upon admission.  Symptoms were reported as significantly decreased or resolved completely. The patient was motivated to continue taking medication with a goal of continued improvement in mental health.    Comments: Patient discharged with plan to go to the [facility] program on Tuesday.  I expressed to her again the importance of her following up with a primary care doctor at her appointment next week to check her potassium level and figure out what needs to happen with her potassium supplementation.  She expressed understanding and agreed.  Signed: Carlyn Reichert, MD PGY-2

## 2023-01-04 NOTE — Progress Notes (Signed)
  [facility] Adult Case Management Discharge Plan :  Will you be returning to the same living situation after discharge:  Yes,  Patient will be returning back with her husband  At discharge, do you have transportation home?: Yes,  Patient husband will be picking her up at 11:30 AM Do you have the ability to pay for your medications: Yes,  [employer]   Release of information consent forms completed and in the chart;  Patient's signature needed at discharge.  Patient to Follow up at:  Follow-up Information     [facility] Follow up on 01/09/2023.   Specialty: Behavioral Health Why: You are scheduled for an assessment for the PHP on Friday, 5/14 at 10:00 am. This appointment will last approximately one hour and will be virtual via [employer] Please download the [employer] Teams app prior to the appointment. If you need to cancel or reschedule, please call [phone] and leave a voicemail with your name, date of birth, and phone number Contact information: [address] [place] [address] [phone]        Plotnikov, Georgina Quint, MD Follow up on 01/11/2023.   Specialty: Internal Medicine Why: You have a scheduled PCP appointment with this proovider, next thursday @ 10:40 AM. Contact information: [address] [place] [address] [phone]                 Next level of care provider has access to [facility] Link:yes  Safety Planning and Suicide Prevention discussed: Yes,  Readie Burchardt, spouse, [phone]     Has patient been referred to the Quitline?: Patient does not use tobacco/nicotine products  Patient has been referred for addiction treatment: No known substance use disorder.  Isabella Bowens, LCSWA 01/04/2023, 9:49 AM

## 2023-01-04 NOTE — Plan of Care (Signed)
  Problem: Education: Goal: Knowledge of [facility] Education information/materials will improve Outcome: Progressing   Problem: Activity: Goal: Sleeping patterns will improve Outcome: Progressing   Problem: Education: Goal: Knowledge of the prescribed therapeutic regimen will improve Outcome: Progressing

## 2023-01-04 NOTE — Group Note (Signed)
Date:  01/04/2023 Time:  11:16 AM  Group Topic/Focus:  Goals Group:   The focus of this group is to help patients establish daily goals to achieve during treatment and discuss how the patient can incorporate goal setting into their daily lives to aide in recovery.    Participation Level:  Active  Participation Quality:  Appropriate  Affect:  Appropriate  Cognitive:  Appropriate  Insight: Appropriate  Engagement in Group:  Engaged  Modes of Intervention:  Discussion  Additional Comments:     Reymundo Poll 01/04/2023, 11:16 AM

## 2023-01-04 NOTE — B[facility] Suicide Risk Assessment (Signed)
[facility] Discharge Suicide Risk Assessment   Principal Problem: MDD (major depressive disorder), recurrent severe, without psychosis (HCC) Discharge Diagnoses: Principal Problem:   MDD (major depressive disorder), recurrent severe, without psychosis (HCC)    Total Time spent with patient: 15 min   During the patient's hospitalization, patient had extensive initial psychiatric evaluation, and follow-up psychiatric evaluations every day.  Upon evaluation, psychiatric diagnoses were given as follows:  Major depressive disorder Generalized anxiety disorder  Patient's psychiatric medications were adjusted on admission:  Restart Trintellix at 10 mg daily (patient was previously on this medication a few weeks ago) Discontinue home Prozac Continue home Klonopin 1 mg nightly and doxepin 75 mg nightly  During the hospitalization, other adjustments were made to the patient's psychiatric medication regimen:  Trintellix was increased to 20 mg daily, it was felt that her previous dose of 5 or 10 mg was insufficient to treat her depressive symptoms  Gradually, patient started adjusting to milieu.   Patient's care was discussed during the interdisciplinary team meeting every day during the hospitalization.  The patient denied having side effects to prescribed psychiatric medication.  The patient reports their target psychiatric symptoms of depression responded well to the psychiatric medications, and the patient reports overall benefit other psychiatric hospitalization. Supportive psychotherapy was provided to the patient. The patient also participated in regular group therapy while admitted.   Labs were reviewed with the patient, and abnormal results were discussed with the patient.  The patient denied having suicidal thoughts more than 48 hours prior to discharge.  Patient denies having homicidal thoughts.  Patient denies having auditory hallucinations.  Patient denies any visual hallucinations.   Patient denies having paranoid thoughts.  The patient is able to verbalize their individual safety plan to this provider.  It is recommended to the patient to continue psychiatric medications as prescribed, after discharge from the hospital.    It is recommended to the patient to follow up with your outpatient psychiatric provider and PCP.  Discussed with the patient, the impact of alcohol, drugs, tobacco have been there overall psychiatric and medical wellbeing, and total abstinence from substance use was recommended the patient.    Psychiatric Specialty Exam: Physical Exam Constitutional:      Appearance: the patient is not toxic-appearing.  Pulmonary:     Effort: Pulmonary effort is normal.  Neurological:     General: No focal deficit present.     Mental Status: the patient is alert and oriented to person, place, and time.   Review of Systems  Respiratory:  Negative for shortness of breath.   Cardiovascular:  Negative for chest pain.  Gastrointestinal:  Negative for abdominal pain, constipation, diarrhea, nausea and vomiting.  Neurological:  Negative for headaches.      BP (!) 126/96 (BP Location: Right Arm)   Pulse 98   Temp 98.2 F (36.8 C) (Oral)   Resp 16   Ht 5\' 4"  (1.626 m)   Wt 59.4 kg   SpO2 100%   BMI 22.49 kg/m   General Appearance: Fairly Groomed  Eye Contact:  Good  Speech:  Clear and Coherent  Volume:  Normal  Mood:  Euthymic  Affect:  Congruent  Thought Process:  Coherent  Orientation:  Full (Time, Place, and Person)  Thought Content: Logical   Suicidal Thoughts:  No  Homicidal Thoughts:  No  Memory:  Immediate;   Good  Judgement:  fair  Insight:  fair  Psychomotor Activity:  Normal  Concentration:  Concentration: Good  Recall:  Good  Fund of Knowledge: Good  Language: Good  Akathisia:  No  Handed:  not assessed  AIMS (if indicated): not done  Assets:  Communication Skills Desire for Improvement Financial  Resources/Insurance Housing Leisure Time Physical Health  ADL's:  Intact  Cognition: WNL  Sleep:  Fair     Mental Status Per Nursing Assessment::   On Admission:  Self-harm behaviors, Self-harm thoughts (PTA)  Demographic Factors:  Caucasian  Loss Factors: NA  Historical Factors: Impulsivity  Risk Reduction Factors:   Sense of responsibility to family, Living with another person, especially a relative, Positive social support, Positive therapeutic relationship, and Positive coping skills or problem solving skills  Continued Clinical Symptoms:  Depression:   Severe  Cognitive Features That Contribute To Risk:  None    Suicide Risk:  mild   Follow-up Information     [facility] Follow up on 01/09/2023.   Specialty: Behavioral Health Why: You are scheduled for an assessment for the PHP on Friday, 5/14 at 10:00 am. This appointment will last approximately one hour and will be virtual via [employer] Please download the [employer] Teams app prior to the appointment. If you need to cancel or reschedule, please call [phone] and leave a voicemail with your name, date of birth, and phone number Contact information: [address] [place] [address] [phone]        Plotnikov, Georgina Quint, MD Follow up on 01/11/2023.   Specialty: Internal Medicine Why: You have a scheduled PCP appointment with this proovider, next thursday @ 10:40 AM. Contact information: [address] [place] [address] [phone]                 Plan Of Care/Follow-up recommendations:  Activity as tolerated. Diet as recommended by PCP. Keep all scheduled follow-up appointments as recommended.  Patient is instructed to take all prescribed medications as recommended. Report any side effects or adverse reactions to your outpatient psychiatrist. Patient is instructed to abstain from alcohol and illegal drugs while  on prescription medications. In the event of worsening symptoms, patient is instructed to call the crisis hotline, 911, or go to the nearest emergency department for evaluation and treatment.  Prescriptions given at discharge. Patient agreeable to plan. Given opportunity to ask questions. Appears to feel comfortable with discharge.  Patient is also instructed prior to discharge to: Take all medications as prescribed by mental healthcare provider. Report any adverse effects and or reactions from the medicines to outpatient provider promptly. Patient has been instructed & cautioned: To not engage in alcohol and or illegal drug use while on prescription medicines. In the event of worsening symptoms,  patient is instructed to call the crisis hotline, 911 and or go to the nearest ED for appropriate evaluation and treatment of symptoms. To follow-up with primary care provider for other medical issues, concerns and or health care needs  The patient was evaluated each day by a clinical provider to ascertain response to treatment. Improvement was noted by the patient's report of decreasing symptoms, improved sleep and appetite, affect, medication tolerance, behavior, and participation in unit programming.  Patient was asked each day to complete a self inventory noting mood, mental status, pain, new symptoms, anxiety and concerns.  Patient responded well to medication and being in a therapeutic and supportive environment. Positive and appropriate behavior was noted and the patient was motivated for recovery. The patient worked closely with the treatment team and case manager to develop  a discharge plan with appropriate goals. Coping skills, problem solving as well as relaxation therapies were also part of the unit programming.  By the day of discharge patient was in much improved condition than upon admission.  Symptoms were reported as significantly decreased or resolved completely. The patient was motivated to  continue taking medication with a goal of continued improvement in mental health.     Carlyn Reichert, MD PGY-2

## 2023-01-04 NOTE — Plan of Care (Signed)
Nurse discussed anxiety, depression and coping skills with patient.  

## 2023-01-04 NOTE — Progress Notes (Signed)
Discharge Note:  Patient discharged home with husband.  Suicide prevention information given and discussed with patient who stated she understood and had no questions.  Denied SI and HI.  Denied A/V hallucinations.  Patient stated she received all her belongings, clothing, toiletries, misc items, etc.  Patient stated she appreciated all assistance received from [facility] staff.  All required discharge information given. ° °

## 2023-01-08 ENCOUNTER — Telehealth (HOSPITAL_COMMUNITY): Payer: Self-pay | Admitting: Licensed Clinical Social Worker

## 2023-01-09 ENCOUNTER — Encounter (HOSPITAL_COMMUNITY): Payer: Self-pay

## 2023-01-09 ENCOUNTER — Other Ambulatory Visit (HOSPITAL_COMMUNITY): Payer: Commercial Managed Care - PPO | Attending: Psychiatry | Admitting: Licensed Clinical Social Worker

## 2023-01-09 DIAGNOSIS — F411 Generalized anxiety disorder: Secondary | ICD-10-CM

## 2023-01-09 DIAGNOSIS — F332 Major depressive disorder, recurrent severe without psychotic features: Secondary | ICD-10-CM
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
## 2023-01-10 ENCOUNTER — Other Ambulatory Visit (HOSPITAL_COMMUNITY): Payer: Commercial Managed Care - PPO

## 2023-01-11 ENCOUNTER — Other Ambulatory Visit (HOSPITAL_COMMUNITY): Payer: Commercial Managed Care - PPO

## 2023-01-11 ENCOUNTER — Ambulatory Visit: Payer: Commercial Managed Care - PPO | Admitting: Internal Medicine

## 2023-01-11 ENCOUNTER — Encounter: Payer: Self-pay | Admitting: Internal Medicine

## 2023-01-11 VITALS — BP 118/80 | HR 91 | Temp 98.2°F | Ht 64.0 in | Wt 126.0 lb

## 2023-01-11 DIAGNOSIS — N1831 Chronic kidney disease, stage 3a: Secondary | ICD-10-CM | POA: Diagnosis not present

## 2023-01-11 DIAGNOSIS — E876 Hypokalemia: Secondary | ICD-10-CM | POA: Diagnosis not present

## 2023-01-11 DIAGNOSIS — F332 Major depressive disorder, recurrent severe without psychotic features: Secondary | ICD-10-CM

## 2023-01-11 DIAGNOSIS — E861 Hypovolemia: Secondary | ICD-10-CM

## 2023-01-11 DIAGNOSIS — F411 Generalized anxiety disorder: Secondary | ICD-10-CM

## 2023-01-11 DIAGNOSIS — R61 Generalized hyperhidrosis: Secondary | ICD-10-CM | POA: Diagnosis not present

## 2023-01-11 LAB — CBC WITH DIFFERENTIAL/PLATELET
Basophils Absolute: 0 10*3/uL (ref 0.0–0.1)
Basophils Relative: 0.6 % (ref 0.0–3.0)
Eosinophils Absolute: 0.1 10*3/uL (ref 0.0–0.7)
Eosinophils Relative: 1.6 % (ref 0.0–5.0)
HCT: 37.6 % (ref 36.0–46.0)
Hemoglobin: 12.4 g/dL (ref 12.0–15.0)
Lymphocytes Relative: 40.5 % (ref 12.0–46.0)
Lymphs Abs: 2.5 10*3/uL (ref 0.7–4.0)
MCHC: 32.9 g/dL (ref 30.0–36.0)
MCV: 92.1 fl (ref 78.0–100.0)
Monocytes Absolute: 0.4 10*3/uL (ref 0.1–1.0)
Monocytes Relative: 6.5 % (ref 3.0–12.0)
Neutro Abs: 3.1 10*3/uL (ref 1.4–7.7)
Neutrophils Relative %: 50.8 % (ref 43.0–77.0)
Platelets: 457 10*3/uL — ABNORMAL HIGH (ref 150.0–400.0)
RBC: 4.08 Mil/uL (ref 3.87–5.11)
RDW: 14.2 % (ref 11.5–15.5)
WBC: 6.1 10*3/uL (ref 4.0–10.5)

## 2023-01-11 LAB — FOLLICLE STIMULATING HORMONE: FSH: 147.6 m[IU]/mL

## 2023-01-11 LAB — COMPREHENSIVE METABOLIC PANEL
ALT: 18 U/L (ref 0–35)
AST: 23 U/L (ref 0–37)
Albumin: 4.4 g/dL (ref 3.5–5.2)
Alkaline Phosphatase: 67 U/L (ref 39–117)
BUN: 23 mg/dL (ref 6–23)
CO2: 21 mEq/L (ref 19–32)
Calcium: 9.6 mg/dL (ref 8.4–10.5)
Chloride: 105 mEq/L (ref 96–112)
Creatinine, Ser: 1.36 mg/dL — ABNORMAL HIGH (ref 0.40–1.20)
GFR: 44.56 mL/min — ABNORMAL LOW (ref >60.00)
Glucose, Bld: 94 mg/dL (ref 70–99)
Potassium: 3.2 mEq/L — ABNORMAL LOW (ref 3.5–5.1)
Sodium: 134 mEq/L — ABNORMAL LOW (ref 135–145)
Total Bilirubin: 0.5 mg/dL (ref 0.2–1.2)
Total Protein: 7.8 g/dL (ref 6.0–8.3)

## 2023-01-11 LAB — MAGNESIUM: Magnesium: 1.9 mg/dL (ref 1.5–2.5)

## 2023-01-11 LAB — CORTISOL: Cortisol, Plasma: 5.5 ug/dL

## 2023-01-11 NOTE — Assessment & Plan Note (Signed)
Emily Park made a suicidal attempt with injecting herself with 10 units of Lantus insulin. She is now seeing a psychiatrist and a psychologist, attending group classes. Currently on Trintellix and Topamax Seems to be doing well.

## 2023-01-11 NOTE — Assessment & Plan Note (Addendum)
Recent.  Obtain cortisol, c-Met

## 2023-01-11 NOTE — Assessment & Plan Note (Signed)
Continue good hydration.  Monitor GFR

## 2023-01-11 NOTE — Assessment & Plan Note (Signed)
Emily Park made a suicidal attempt with injecting herself with 10 units of Lantus insulin. She is now seeing a psychiatrist and a psychologist, attending group classes.

## 2023-01-11 NOTE — Progress Notes (Signed)
Subjective:  Patient ID: Emily Park, female    DOB: 01/30/1971  Age: 52 y.o. MRN: [id number]  CC: Follow-up   HPI [name] presents for 1 wk admission for depression following admission for hypoglycemic coma.  Anova made a suicidal attempt with injecting herself with 10 units of Lantus insulin.   Per recent hx:  "Admit date: 12/20/2022 Discharge date: 12/28/2022   Admission Diagnoses:   Discharge Diagnoses:  Principal Problem:   Hypoglycemia Active Problems:   UTI (urinary tract infection)   Hypokalemia   IBS (irritable bowel syndrome)   Generalized anxiety disorder   CKD stage 3a, GFR 45-59 ml/min (HCC)   Chronic anemia   Major depressive disorder, recurrent episode, severe (HCC)  Toxic encephalopathy due to insulin overdose.   Suicidal behavior     Discharged Condition: stable   Hospital Course: Patient is a 52 year old female past medical history significant for GERD, IBS, and anxiety.  Patient was admitted with altered mental status. Patient was found unresponsive on the floor by her husband. EMS was called and she was found hypotensive 70/48, with glucose 22, and having a possible seizure. She was given IV fluids, started on IV epinephrine and received one amp D50 IV, and transported to the ED. There were concerns for intentional insulin overdose.  Patient was admitted for further assessment and management.  Patient was seen and followed by psychiatric team.  Patient is medically stable.  Patient will be transferred to the [facility] for further psychiatric care.     Hypoglycemia: -Initially on octreotide, IV steroids and dextrose infusion.  Dextrose and octreotide discontinued.  Hydrocortisone has been transitioned to every 12, will continue to taper steroids. -Continue supportive care.     Hypokalemia -Continued aggressive repletion -KCl 40 mEq p.o. once daily on discharge.  Repeat BMP tomorrow, 12/29/2022, and then every 2 days x 2.  Manage expectantly.    Generalized anxiety disorder Seen by catchy-inpatient psych admission is recommended -Continue fluoxetine 40 mg daily, Klonopin 1 mg nightly, doxepin 75 mg nightly. -Psychiatry to review combination of fluoxetine and doxepin.     CKD stage 3a, GFR 45-59 ml/min (HCC) versus AKI Renal function stable at 0.9 -Renal function is back to normal.     Chronic anemia Hemoglobin stable around 11   UTI (urinary tract infection) Urine cultures with no growth.  Completed course of IV Rocephin   IBS (irritable bowel syndrome) Continue with pantoprazole and linaclotide.    Major depressive disorder, recurrent episode, severe (HCC) Management per psychiatry team.  Currently on Prozac, doxepin and Klonopin   Consults: psychiatry   Significant Diagnostic Studies: Blood sugar revealed significantly low glucose.   Discharge Exam: Blood pressure (!) 127/93, pulse 83, temperature 98.9 F (37.2 C), temperature source Oral, resp. rate 19, height 5' 4.75" (1.645 m), weight 55.7 kg, SpO2 99 %.     Disposition: Discharge disposition: [facility]-Another [facility] Care Institution Not Defined"          Outpatient Medications Prior to Visit  Medication Sig Dispense Refill   ACCRUFER 30 MG CAPS Take 1 capsule by mouth 2 (two) times daily.     Ascorbic Acid (VITAMIN C PO) Take 1 tablet by mouth every morning.     Cholecalciferol (VITAMIN D3) 50 MCG (2000 UT) capsule Take 2,000 Units by mouth daily.     clonazePAM (KLONOPIN) 1 MG tablet Take 1 mg by mouth at bedtime.     doxepin (SINEQUAN) 75 MG capsule Take 75 mg by mouth at  bedtime.     hydrocortisone (ANUSOL-HC) 2.5 % rectal cream Apply 1 Application topically 4 (four) times daily as needed for hemorrhoids. 30 g 0   hydrOXYzine (ATARAX) 25 MG tablet Take 1 tablet (25 mg total) by mouth 3 (three) times daily as needed for anxiety (sleep). 30 tablet 0   linaclotide (LINZESS) 145 MCG CAPS capsule Take 1 capsule (145 mcg total) by mouth daily before breakfast.  30 capsule 0   loratadine (CLARITIN) 10 MG tablet Take 1 tablet (10 mg total) by mouth daily as needed for allergies. 30 tablet 0   Multiple Vitamin (MULTIVITAMIN WITH MINERALS) TABS tablet Take 1 tablet by mouth every morning.     pantoprazole (PROTONIX) 40 MG tablet Take 40 mg by mouth 2 (two) times daily.     polyethylene glycol (MIRALAX / GLYCOLAX) 17 g packet Take 17 g by mouth 2 (two) times daily. (Patient taking differently: Take 17 g by mouth daily as needed for moderate constipation.) 14 each 0   polyvinyl alcohol (LIQUIFILM TEARS) 1.4 % ophthalmic solution Place 1 drop into both eyes as needed for dry eyes. 15 mL 0   potassium chloride SA (KLOR-CON M) 20 MEQ tablet Take 1 tablet (20 mEq total) by mouth 2 (two) times daily for 14 days. 28 tablet 0   rizatriptan (MAXALT) 10 MG tablet PLEASE SEE ATTACHED FOR DETAILED DIRECTIONS     SODIUM FLUORIDE 5000 PPM 1.1 % PSTE PLEASE SEE ATTACHED FOR DETAILED DIRECTIONS     topiramate (TOPAMAX) 100 MG tablet Take 1 tablet (100 mg total) by mouth daily. 30 tablet 0   vortioxetine HBr (TRINTELLIX) 20 MG TABS tablet Take 1 tablet (20 mg total) by mouth daily. 30 tablet 0   No facility-administered medications prior to visit.    ROS: Review of Systems  Constitutional:  Positive for fatigue. Negative for activity change, appetite change, chills and unexpected weight change.  HENT:  Negative for congestion, mouth sores and sinus pressure.   Eyes:  Negative for visual disturbance.  Respiratory:  Negative for cough and chest tightness.   Gastrointestinal:  Negative for abdominal pain and nausea.  Genitourinary:  Negative for difficulty urinating, frequency and vaginal pain.  Musculoskeletal:  Negative for back pain and gait problem.  Skin:  Negative for pallor and rash.  Neurological:  Negative for dizziness, tremors, weakness, numbness and headaches.  Psychiatric/Behavioral:  Positive for dysphoric mood. Negative for confusion, decreased  concentration, sleep disturbance and suicidal ideas. The patient is nervous/anxious.     Objective:  BP 118/80 (BP Location: Left Arm, Patient Position: Sitting, Cuff Size: Normal)   Pulse 91   Temp 98.2 F (36.8 C) (Oral)   Ht 5\' 4"  (1.626 m)   Wt 126 lb (57.2 kg)   SpO2 99%   BMI 21.63 kg/m   BP Readings from Last 3 Encounters:  01/11/23 118/80  01/04/23 (!) 126/96  12/28/22 124/78    Wt Readings from Last 3 Encounters:  01/11/23 126 lb (57.2 kg)  12/28/22 131 lb (59.4 kg)  12/27/22 122 lb 12.7 oz (55.7 kg)    Physical Exam Constitutional:      General: She is not in acute distress.    Appearance: Normal appearance. She is well-developed.  HENT:     Head: Normocephalic.     Right Ear: External ear normal.     Left Ear: External ear normal.     Nose: Nose normal.  Eyes:     General:  Right eye: No discharge.        Left eye: No discharge.     Conjunctiva/sclera: Conjunctivae normal.     Pupils: Pupils are equal, round, and reactive to light.  Neck:     Thyroid: No thyromegaly.     Vascular: No JVD.     Trachea: No tracheal deviation.  Cardiovascular:     Rate and Rhythm: Normal rate and regular rhythm.     Heart sounds: Normal heart sounds.  Pulmonary:     Effort: No respiratory distress.     Breath sounds: No stridor. No wheezing.  Abdominal:     General: Bowel sounds are normal. There is no distension.     Palpations: Abdomen is soft. There is no mass.     Tenderness: There is no abdominal tenderness. There is no guarding or rebound.  Musculoskeletal:        General: No tenderness.     Cervical back: Normal range of motion and neck supple. No rigidity.  Lymphadenopathy:     Cervical: No cervical adenopathy.  Skin:    Findings: No erythema or rash.  Neurological:     Cranial Nerves: No cranial nerve deficit.     Motor: No abnormal muscle tone.     Coordination: Coordination normal.     Deep Tendon Reflexes: Reflexes normal.  Psychiatric:         Behavior: Behavior normal.        Thought Content: Thought content normal.        Judgment: Judgment normal.     Lab Results  Component Value Date   WBC 6.1 01/11/2023   HGB 12.4 01/11/2023   HCT 37.6 01/11/2023   PLT 457.0 (H) 01/11/2023   GLUCOSE 94 01/11/2023   CHOL 208 (H) 02/24/2014   TRIG 81.0 02/24/2014   HDL 54.90 02/24/2014   LDLCALC 137 (H) 02/24/2014   ALT 18 01/11/2023   AST 23 01/11/2023   NA 134 (L) 01/11/2023   K 3.2 (L) 01/11/2023   CL 105 01/11/2023   CREATININE 1.36 (H) 01/11/2023   BUN 23 01/11/2023   CO2 21 01/11/2023   TSH 0.175 (L) 12/21/2022   INR 1.1 12/20/2022   HGBA1C 4.9 12/20/2022    No results found.  Assessment & Plan:   Problem List Items Addressed This Visit     Hypotension    Recent.  Obtain cortisol, c-Met      Hypokalemia - Primary   Relevant Orders   Magnesium (Completed)   Comprehensive metabolic panel (Completed)   CBC with Differential/Platelet (Completed)   FSH (Completed)   Cortisol (Completed)   Generalized anxiety disorder    Charlanne made a suicidal attempt with injecting herself with 10 units of Lantus insulin. She is now seeing a psychiatrist and a psychologist, attending group classes.      Relevant Orders   Magnesium (Completed)   Comprehensive metabolic panel (Completed)   CBC with Differential/Platelet (Completed)   FSH (Completed)   Cortisol (Completed)   CKD stage 3a, GFR 45-59 ml/min (HCC)    Continue good hydration.  Monitor GFR      Relevant Orders   Magnesium (Completed)   Comprehensive metabolic panel (Completed)   CBC with Differential/Platelet (Completed)   FSH (Completed)   Cortisol (Completed)   MDD (major depressive disorder), recurrent severe, without psychosis (HCC)    Havanna made a suicidal attempt with injecting herself with 10 units of Lantus insulin. She is now seeing a psychiatrist and a psychologist, attending  group classes. Currently on Trintellix and Topamax Seems to be doing  well.      Other Visit Diagnoses     Night sweats       Relevant Orders   FSH (Completed)   Cortisol (Completed)         No orders of the defined types were placed in this encounter.     Follow-up: Return in about 6 weeks (around 02/22/2023) for a follow-up visit.  Sonda Primes, MD

## 2023-01-12 ENCOUNTER — Encounter: Payer: Self-pay | Admitting: Internal Medicine

## 2023-01-12 ENCOUNTER — Other Ambulatory Visit: Payer: Self-pay | Admitting: Internal Medicine

## 2023-01-12 ENCOUNTER — Other Ambulatory Visit (HOSPITAL_COMMUNITY): Payer: Commercial Managed Care - PPO

## 2023-01-12 MED ORDER — POTASSIUM CHLORIDE CRYS ER 20 MEQ PO TBCR: 20.0000 meq | EXTENDED_RELEASE_TABLET | Freq: Every day | ORAL | 3 refills | Status: DC

## 2023-01-15 ENCOUNTER — Other Ambulatory Visit: Payer: Self-pay | Admitting: Internal Medicine

## 2023-01-15 ENCOUNTER — Other Ambulatory Visit (HOSPITAL_COMMUNITY): Payer: Commercial Managed Care - PPO

## 2023-01-16 ENCOUNTER — Other Ambulatory Visit (HOSPITAL_COMMUNITY): Payer: Commercial Managed Care - PPO | Attending: Psychiatry

## 2023-01-16 ENCOUNTER — Other Ambulatory Visit (HOSPITAL_COMMUNITY): Payer: Commercial Managed Care - PPO | Attending: Psychiatry | Admitting: Licensed Clinical Social Worker

## 2023-01-16 ENCOUNTER — Telehealth (HOSPITAL_COMMUNITY): Payer: Self-pay | Admitting: Licensed Clinical Social Worker

## 2023-01-16 DIAGNOSIS — F322 Major depressive disorder, single episode, severe without psychotic features: Secondary | ICD-10-CM | POA: Insufficient documentation

## 2023-01-16 DIAGNOSIS — F332 Major depressive disorder, recurrent severe without psychotic features: Secondary | ICD-10-CM | POA: Diagnosis not present

## 2023-01-16 DIAGNOSIS — F411 Generalized anxiety disorder: Secondary | ICD-10-CM | POA: Diagnosis present

## 2023-01-16 DIAGNOSIS — F4310 Post-traumatic stress disorder, unspecified: Secondary | ICD-10-CM | POA: Diagnosis not present

## 2023-01-16 DIAGNOSIS — G43909 Migraine, unspecified, not intractable, without status migrainosus: Secondary | ICD-10-CM | POA: Diagnosis not present

## 2023-01-16 DIAGNOSIS — K589 Irritable bowel syndrome without diarrhea: Secondary | ICD-10-CM | POA: Diagnosis not present

## 2023-01-16 DIAGNOSIS — R4589 Other symptoms and signs involving emotional state: Secondary | ICD-10-CM
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
## 2023-01-17 ENCOUNTER — Encounter (HOSPITAL_COMMUNITY): Payer: Self-pay

## 2023-01-17 ENCOUNTER — Other Ambulatory Visit (HOSPITAL_COMMUNITY): Payer: Commercial Managed Care - PPO

## 2023-01-17 ENCOUNTER — Other Ambulatory Visit (HOSPITAL_COMMUNITY): Payer: Commercial Managed Care - PPO | Admitting: Licensed Clinical Social Worker

## 2023-01-17 DIAGNOSIS — F411 Generalized anxiety disorder: Secondary | ICD-10-CM

## 2023-01-17 DIAGNOSIS — R4589 Other symptoms and signs involving emotional state: Secondary | ICD-10-CM

## 2023-01-17 DIAGNOSIS — F332 Major depressive disorder, recurrent severe without psychotic features: Secondary | ICD-10-CM | POA: Diagnosis not present

## 2023-01-17 NOTE — Therapy (Signed)
[facility] PARTIAL HOSPITALIZATION PROGRAM [address] [place], [place], [address] Phone: [phone]   Fax:  [phone]  Occupational Therapy Evaluation Virtual Visit via Video Note  I connected with Emily Park on 01/17/23 at  8:00 AM EDT by a video enabled telemedicine application and verified that I am speaking with the correct person using two identifiers.  Location: Patient: home Provider: office   I discussed the limitations of evaluation and management by telemedicine and the availability of in person appointments. The patient expressed understanding and agreed to proceed.    The patient was advised to call back or seek an in-person evaluation if the symptoms worsen or if the condition fails to improve as anticipated.  I provided 85 minutes of non-face-to-face time during this encounter.   Patient Details  Name: Emily Park Date of Birth: [birth year] No data recorded  Encounter Date: 01/16/2023   OT End of Session - 01/17/23 1928     Visit Number 1    Number of Visits 20    Date for OT Re-Evaluation 02/16/23    OT Start Time 1000    OT Stop Time 1255   Eval: 30; Tx: 55   OT Time Calculation (min) 175 min    Activity Tolerance Patient tolerated treatment well             Past Medical History:  Diagnosis Date   Abnormal TSH    ADD (attention deficit disorder)    Anemia    Anxiety    Arrhythmia    Decreased libido    Depression    Heart murmur    Hyperlipidemia    Hypokalemia    Migraine    Nephrolithiasis    Pap smear abnormality of cervix/human papillomavirus (HPV) positive    Sleep difficulties    SVT (supraventricular tachycardia)     Past Surgical History:  Procedure Laterality Date   ABLATION     Cardiac ablation, endometrial ablation   BREAST LUMPECTOMY     right breast   BREAST LUMPECTOMY Right    CARDIOVERSION     COLONOSCOPY WITH PROPOFOL N/A 05/16/2021   Procedure: COLONOSCOPY  WITH PROPOFOL;  Surgeon: Charlott Rakes, MD;  Location: [facility];  Service: Endoscopy;  Laterality: N/A;   COLPOSCOPY     w/ cervical biopsy   DILITATION & CURRETTAGE/HYSTROSCOPY WITH NOVASURE ABLATION N/A 09/11/2014   Procedure: DILATATION & CURETTAGE/HYSTEROSCOPY WITH NOVASURE ABLATION;  Surgeon: Lenoard Aden, MD;  Location: [facility];  Service: Gynecology;  Laterality: N/A;   GIVENS CAPSULE STUDY N/A 05/16/2021   Procedure: GIVENS CAPSULE STUDY;  Surgeon: Charlott Rakes, MD;  Location: [facility];  Service: Endoscopy;  Laterality: N/A;   XI ROBOT ASSISTED RECTOPEXY N/A 07/07/2021   Procedure: XI ROBOT ASSISTED RECTOPEXY;  Surgeon: Karie Soda, MD;  Location: [facility];  Service: General;  Laterality: N/A;   XI ROBOTIC ASSISTED LOWER ANTERIOR RESECTION N/A 07/07/2021   Procedure: XI ROBOTIC ASSISTED LOW ANTERIOR RECTOSIGMOID RESECTION WITH RIGID PROCTOSCOPY, TAP BLOCK;  Surgeon: Karie Soda, MD;  Location: [facility];  Service: General;  Laterality: N/A;    There were no vitals filed for this visit.   Subjective Assessment - 01/17/23 1925     Subjective  "Hoping to learn how to use coping skills while I am here to improve my day to day life"    Pertinent History ADHD, GAD, MDD    Currently in Pain? No/denies    Pain Score 0-No pain  Multiple Pain Sites No                OT Assessment  Diagnosis: MDD, ADHD, GAD Past medical history/referral information: MDD Living situation: pvt residence ADLs: independent  Work: na Leisure: inhibited Social support:  fair Struggles: goals, routines, interests OT goal:  improve deficit areas to allow for improved occupational performance in daily activities  OCAIRS Mental Health Interview Summary of Client Scores:  Facilitates participation in occupation Allows participation in occupation Inhibits participation in occupation Restricts participation in occupation Comments:  Roles   X    Habits   X    Personal Causation   X     Values  X     Interests   X    Skills   X    Short-Term Goals  X     Long-term Goals   X    Interpretation of Past Experiences  X     Physical Environment  X     Social Environment   X    Readiness for Change  X       Need for Occupational Therapy:  4 Shows positive occupational participation, no need for OT.   3 Need for minimal intervention/consultative participation   2 Need for OT intervention indicated to restore/improve participation   1 Need for extensive OT intervention indicated to improve participation.  Referral for follow up services also recommended.   Assessment:  Patient demonstrates behavior that INHIBITS participation in occupation.  Patient will benefit from occupational therapy intervention in order to improve time management, financial management, stress management, job readiness skills, social skills, and health management skills in preparation to return to full time community living and to be a productive community member.    Plan:  Patient will participate in skilled occupational therapy sessions individually or in a group setting to improve coping skills, psychosocial skills, and emotional skills required to return to prior level of function. Treatment will be 4-5 times per week for 4 weeks.      Group Session:   O: During today's OT group session, the patient participated in an educational segment about the importance of goal-setting and the application of the SMART framework to enhance daily life, particularly focusing on ADLs and iADLs. The session began with five open-ended pre-session questions that facilitated group discussion and introspection about their current relationship with goals. Following the introduction and educational segment, participants engaged in brainstorming and group discussions to devise hypothetical SMART goals. The session concluded with five post-session questions to reinforce understanding and facilitate reflection. Throughout the  session, there was a range of engagement levels noted among the participants.   A:  Patient demonstrated a high level of engagement throughout the session. They actively participated in discussions, sharing personal experiences related to goal setting and challenges faced. Patient was able to clearly articulate an understanding of the SMART framework and proposed personal SMART goals related to their own ADLs with minimal assistance. They expressed enthusiasm about applying what they learned to their daily routine and appeared motivated to make changes.                 OT Education - 01/17/23 1927     Education Details OCAIRS / Group Tx: SMART Goals    Person(s) Educated Patient    Methods Explanation;Handout    Comprehension Verbalized understanding              OT Short Term Goals - 01/17/23 1930  OT SHORT TERM GOAL #1   Title Client will develop and utilize a personalized coping toolbox containing at least five coping strategies to manage challenging situations, demonstrating their use in real-life scenarios by the end of therapy.    Time 4    Period Weeks    Status On-going    Target Date 02/16/23      OT SHORT TERM GOAL #2   Title Client will independently identify and modify three areas of the current routine that contribute to increased stress or dysfunction by the end of therapy.    Time 4    Period Weeks    Status On-going    Target Date 02/16/23      OT SHORT TERM GOAL #3   Title By the time of discharge, client will independently set, track, and make progress towards a long-term goal, demonstrating resilience in overcoming obstacles and seeking support when needed.    Time 4    Period Weeks    Status On-going                      Plan - 01/17/23 1929     Clinical Impression Statement Pt presents w/ multiple deficits across psychosocial domains that inhibit general occupational performance and dialy participation in roles, leisure  and goals.    OT Occupational Profile and History Problem Focused Assessment - Including review of records relating to presenting problem    Occupational performance deficits (Please refer to evaluation for details): Rest and Sleep;IADL's;Leisure;Social Participation    Psychosocial Skills Routines and Behaviors;Interpersonal Interaction;Habits;Coping Strategies    Rehab Potential Good    Clinical Decision Making Limited treatment options, no task modification necessary    Comorbidities Affecting Occupational Performance: None    Modification or Assistance to Complete Evaluation  No modification of tasks or assist necessary to complete eval    OT Frequency 5x / week    OT Duration 4 weeks    OT Treatment/Interventions Psychosocial skills training;Coping strategies training    Consulted and Agree with Plan of Care Patient             Patient will benefit from skilled therapeutic intervention in order to improve the following deficits and impairments:       Psychosocial Skills: Routines and Behaviors, Interpersonal Interaction, Habits, Coping Strategies   Visit Diagnosis: Difficulty coping  Generalized anxiety disorder  MDD (major depressive disorder), recurrent severe, without psychosis (HCC)    Problem List Patient Active Problem List   Diagnosis Date Noted   MDD (major depressive disorder), recurrent severe, without psychosis (HCC) 12/28/2022   CKD stage 3a, GFR 45-59 ml/min (HCC) 12/25/2022   Chronic anemia 12/25/2022   Major depressive disorder, recurrent episode, severe (HCC) 12/25/2022   Generalized anxiety disorder 12/24/2022   Hypoglycemia 12/21/2022   Thyroid 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   IBS (irritable bowel syndrome) 10/03/2022   Acute encephalopathy 06/06/2022   High anion gap metabolic acidosis 06/06/2022   Complete rectal prolapse 07/07/2021   Anemia, iron deficiency 05/13/2021   Hypokalemia  05/13/2021   Diarrhea 04/14/2021   Intractable vomiting with nausea 04/14/2021   UTI (urinary tract infection) 04/14/2021   Sepsis (HCC) 04/13/2021   Atypical squamous cells of undetermined significance on cytologic smear of cervix (ASC-US) 02/24/2021   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   Anxiety 02/24/2021   Heart murmur 02/24/2021   Menorrhagia 02/24/2021   Thyroid nodule 10/26/2020   Acute renal  failure superimposed on stage 2 chronic kidney disease (HCC)    Hypotension    Hypovolemic shock (HCC) 08/29/2020   Rectal prolapse 04/05/2020   Migraine 02/27/2019   Mild episode of recurrent major depressive disorder (HCC) 10/25/2018   Abnormal thyroid stimulating hormone (TSH) level 10/24/2018   Syncope and collapse 10/10/2018   Lung nodules 11/14/2015   Paroxysmal SVT (supraventricular tachycardia) 11/13/2015   Attention deficit hyperactivity disorder (ADHD) 11/13/2015    Ted Mcalpine, OT 01/17/2023, 7:32 PM  Kerrin Champagne, OT   [facility] HOSPITALIZATION PROGRAM [address] [place], [place], [address] Phone: [phone]   Fax:  [phone]  Name: Emily Park MRN: [id number] Date of Birth: December 23, 1970

## 2023-01-18 ENCOUNTER — Encounter (HOSPITAL_COMMUNITY): Payer: Self-pay

## 2023-01-18 ENCOUNTER — Other Ambulatory Visit (HOSPITAL_COMMUNITY): Payer: Commercial Managed Care - PPO | Admitting: Licensed Clinical Social Worker

## 2023-01-18 ENCOUNTER — Other Ambulatory Visit (HOSPITAL_COMMUNITY): Payer: Commercial Managed Care - PPO

## 2023-01-18 DIAGNOSIS — R4589 Other symptoms and signs involving emotional state: Secondary | ICD-10-CM

## 2023-01-18 DIAGNOSIS — F411 Generalized anxiety disorder: Secondary | ICD-10-CM

## 2023-01-18 DIAGNOSIS — F332 Major depressive disorder, recurrent severe without psychotic features: Secondary | ICD-10-CM | POA: Diagnosis not present

## 2023-01-18 NOTE — Therapy (Signed)
[facility] PARTIAL HOSPITALIZATION PROGRAM [address] [place], [place], [address] Phone: [phone]   Fax:  [phone]  Occupational Therapy Treatment Virtual Visit via Video Note  I connected with Emily Park on 01/18/23 at  8:00 AM EDT by a video enabled telemedicine application and verified that I am speaking with the correct person using two identifiers.  Location: Patient: home Provider: office   I discussed the limitations of evaluation and management by telemedicine and the availability of in person appointments. The patient expressed understanding and agreed to proceed.    The patient was advised to call back or seek an in-person evaluation if the symptoms worsen or if the condition fails to improve as anticipated.  I provided 55 minutes of non-face-to-face time during this encounter.   Patient Details  Name: Emily Park MRN: [id number] Date of Birth: 06/24/1971 No data recorded  Encounter Date: 01/17/2023   OT End of Session - 01/18/23 0939     Visit Number 2    Number of Visits 20    Date for OT Re-Evaluation 02/16/23    OT Start Time 1200    OT Stop Time 1255    OT Time Calculation (min) 55 min             Past Medical History:  Diagnosis Date   Abnormal TSH    ADD (attention deficit disorder)    Anemia    Anxiety    Arrhythmia    Decreased libido    Depression    Heart murmur    Hyperlipidemia    Hypokalemia    Migraine    Nephrolithiasis    Pap smear abnormality of cervix/human papillomavirus (HPV) positive    Sleep difficulties    SVT (supraventricular tachycardia)     Past Surgical History:  Procedure Laterality Date   ABLATION     Cardiac ablation, endometrial ablation   BREAST LUMPECTOMY     right breast   BREAST LUMPECTOMY Right    CARDIOVERSION     COLONOSCOPY WITH PROPOFOL N/A 05/16/2021   Procedure: COLONOSCOPY WITH PROPOFOL;  Surgeon: Charlott Rakes, MD;  Location: [facility];   Service: Endoscopy;  Laterality: N/A;   COLPOSCOPY     w/ cervical biopsy   DILITATION & CURRETTAGE/HYSTROSCOPY WITH NOVASURE ABLATION N/A 09/11/2014   Procedure: DILATATION & CURETTAGE/HYSTEROSCOPY WITH NOVASURE ABLATION;  Surgeon: Lenoard Aden, MD;  Location: [facility];  Service: Gynecology;  Laterality: N/A;   GIVENS CAPSULE STUDY N/A 05/16/2021   Procedure: GIVENS CAPSULE STUDY;  Surgeon: Charlott Rakes, MD;  Location: [facility];  Service: Endoscopy;  Laterality: N/A;   XI ROBOT ASSISTED RECTOPEXY N/A 07/07/2021   Procedure: XI ROBOT ASSISTED RECTOPEXY;  Surgeon: Karie Soda, MD;  Location: [facility];  Service: General;  Laterality: N/A;   XI ROBOTIC ASSISTED LOWER ANTERIOR RESECTION N/A 07/07/2021   Procedure: XI ROBOTIC ASSISTED LOW ANTERIOR RECTOSIGMOID RESECTION WITH RIGID PROCTOSCOPY, TAP BLOCK;  Surgeon: Karie Soda, MD;  Location: [facility];  Service: General;  Laterality: N/A;    There were no vitals filed for this visit.   Subjective Assessment - 01/18/23 0938     Currently in Pain? No/denies    Pain Score 0-No pain                  Group Session:  S: Doing a bit better today.   O: During today's OT group session, the patient participated in an educational segment about the importance of goal-setting  and the application of the SMART framework to enhance daily life, particularly focusing on ADLs and iADLs. The session began with five open-ended pre-session questions that facilitated group discussion and introspection about their current relationship with goals. Following the introduction and educational segment, participants engaged in brainstorming and group discussions to devise hypothetical SMART goals. The session concluded with five post-session questions to reinforce understanding and facilitate reflection. Throughout the session, there was a range of engagement levels noted among the participants.   A:  Patient demonstrated a high level of engagement  throughout the session. They actively participated in discussions, sharing personal experiences related to goal setting and challenges faced. Patient was able to clearly articulate an understanding of the SMART framework and proposed personal SMART goals related to their own ADLs with minimal assistance. They expressed enthusiasm about applying what they learned to their daily routine and appeared motivated to make changes.    P: Continue to attend PHP OT group sessions 5x week for 4 weeks to promote daily structure, social engagement, and opportunities to develop and utilize adaptive strategies to maximize functional performance in preparation for safe transition and integration back into school, work, and the community. Plan to address topic of pt 3 in next OT group session.                 OT Education - 01/18/23 0938     Education Details SMART Goals 2              OT Short Term Goals - 01/17/23 1930       OT SHORT TERM GOAL #1   Title Client will develop and utilize a personalized coping toolbox containing at least five coping strategies to manage challenging situations, demonstrating their use in real-life scenarios by the end of therapy.    Time 4    Period Weeks    Status On-going    Target Date 02/16/23      OT SHORT TERM GOAL #2   Title Client will independently identify and modify three areas of the current routine that contribute to increased stress or dysfunction by the end of therapy.    Time 4    Period Weeks    Status On-going    Target Date 02/16/23      OT SHORT TERM GOAL #3   Title By the time of discharge, client will independently set, track, and make progress towards a long-term goal, demonstrating resilience in overcoming obstacles and seeking support when needed.    Time 4    Period Weeks    Status On-going                      Plan - 01/18/23 0939     Psychosocial Skills Routines and Behaviors;Interpersonal  Interaction;Habits;Coping Strategies             Patient will benefit from skilled therapeutic intervention in order to improve the following deficits and impairments:       Psychosocial Skills: Routines and Behaviors, Interpersonal Interaction, Habits, Coping Strategies   Visit Diagnosis: Difficulty coping    Problem List Patient Active Problem List   Diagnosis Date Noted   MDD (major depressive disorder), recurrent severe, without psychosis (HCC) 12/28/2022   CKD stage 3a, GFR 45-59 ml/min (HCC) 12/25/2022   Chronic anemia 12/25/2022   Major depressive disorder, recurrent episode, severe (HCC) 12/25/2022   Generalized anxiety disorder 12/24/2022   Hypoglycemia 12/21/2022   Thyroid function test abnormal 10/04/2022  Dry mouth 10/03/2022   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   IBS (irritable bowel syndrome) 10/03/2022   Acute encephalopathy 06/06/2022   High anion gap metabolic acidosis 06/06/2022   Complete rectal prolapse 07/07/2021   Anemia, iron deficiency 05/13/2021   Hypokalemia 05/13/2021   Diarrhea 04/14/2021   Intractable vomiting with nausea 04/14/2021   UTI (urinary tract infection) 04/14/2021   Sepsis (HCC) 04/13/2021   Atypical squamous cells of undetermined significance on cytologic smear of cervix (ASC-US) 02/24/2021   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   Anxiety 02/24/2021   Heart murmur 02/24/2021   Menorrhagia 02/24/2021   Thyroid nodule 10/26/2020   Acute renal failure superimposed on stage 2 chronic kidney disease (HCC)    Hypotension    Hypovolemic shock (HCC) 08/29/2020   Rectal prolapse 04/05/2020   Migraine 02/27/2019   Mild episode of recurrent major depressive disorder (HCC) 10/25/2018   Abnormal thyroid stimulating hormone (TSH) level 10/24/2018   Syncope and collapse 10/10/2018   Lung nodules 11/14/2015   Paroxysmal SVT (supraventricular tachycardia) 11/13/2015   Attention deficit hyperactivity disorder (ADHD) 11/13/2015    Ted Mcalpine,  OT 01/18/2023, 9:40 AM Kerrin Champagne, OT  [facility] HOSPITALIZATION PROGRAM [address] [place], [place], [address] Phone: [phone]   Fax:  [phone]  Name: Emily Park MRN: [id number] Date of Birth: 02-20-1971

## 2023-01-19 ENCOUNTER — Encounter (HOSPITAL_COMMUNITY): Payer: Self-pay

## 2023-01-19 ENCOUNTER — Other Ambulatory Visit (HOSPITAL_COMMUNITY): Payer: Commercial Managed Care - PPO | Admitting: Licensed Clinical Social Worker

## 2023-01-19 ENCOUNTER — Other Ambulatory Visit (HOSPITAL_COMMUNITY): Payer: Commercial Managed Care - PPO

## 2023-01-19 DIAGNOSIS — F332 Major depressive disorder, recurrent severe without psychotic features: Secondary | ICD-10-CM

## 2023-01-19 DIAGNOSIS — F411 Generalized anxiety disorder: Secondary | ICD-10-CM

## 2023-01-19 DIAGNOSIS — R4589 Other symptoms and signs involving emotional state: Secondary | ICD-10-CM

## 2023-01-19 NOTE — Progress Notes (Signed)
Spoke with patient via Team video call, used 2 identifiers to correctly identify patient. States this is her first time in PHP. She was inpatient at [facility] hospital for 1 week after a suicide attempt by injection Lantus insulin into her body. It was her first attempt. She has been having a lot of medical issues in the past few years and she felt her quality of life was going downhill fast. 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 She worked until May 2023 then had to quit. Felt worthless and sad. Her children have moved out and she became depressed. Denies SI/HI or AV hallucinations. States she regrets trying to harm herself and now has a lot of guilt that she did that to her family. On scale 1-10 as 10 being worst she rates depression at 4 and anxiety at 7. PHQ9=11. No side effect from medication. No issues or complaints.

## 2023-01-19 NOTE — Therapy (Signed)
[facility] PARTIAL HOSPITALIZATION PROGRAM [address] [place], [place], [address] Phone: [phone]   Fax:  [phone]  Occupational Therapy Treatment Virtual Visit via Video Note  I connected with Emily Park on 01/19/23 at  8:00 AM EDT by a video enabled telemedicine application and verified that I am speaking with the correct person using two identifiers.  Location: Patient: home Provider: office   I discussed the limitations of evaluation and management by telemedicine and the availability of in person appointments. The patient expressed understanding and agreed to proceed.    The patient was advised to call back or seek an in-person evaluation if the symptoms worsen or if the condition fails to improve as anticipated.  I provided 55 minutes of non-face-to-face time during this encounter.   Patient Details  Name: Emily Park MRN: [id number] Date of Birth: 10/05/1970 No data recorded  Encounter Date: 01/19/2023   OT End of Session - 01/19/23 1733     Visit Number 4    Number of Visits 20    Date for OT Re-Evaluation 02/16/23    OT Start Time 1200    OT Stop Time 1255    OT Time Calculation (min) 55 min             Past Medical History:  Diagnosis Date   Abnormal TSH    ADD (attention deficit disorder)    Anemia    Anxiety    Arrhythmia    Decreased libido    Depression    Heart murmur    Hyperlipidemia    Hypokalemia    Migraine    Nephrolithiasis    Pap smear abnormality of cervix/human papillomavirus (HPV) positive    Sleep difficulties    SVT (supraventricular tachycardia)     Past Surgical History:  Procedure Laterality Date   ABLATION     Cardiac ablation, endometrial ablation   BREAST LUMPECTOMY     right breast   BREAST LUMPECTOMY Right    CARDIOVERSION     COLONOSCOPY WITH PROPOFOL N/A 05/16/2021   Procedure: COLONOSCOPY WITH PROPOFOL;  Surgeon: Charlott Rakes, MD;  Location: [facility];   Service: Endoscopy;  Laterality: N/A;   COLPOSCOPY     w/ cervical biopsy   DILITATION & CURRETTAGE/HYSTROSCOPY WITH NOVASURE ABLATION N/A 09/11/2014   Procedure: DILATATION & CURETTAGE/HYSTEROSCOPY WITH NOVASURE ABLATION;  Surgeon: Lenoard Aden, MD;  Location: [facility];  Service: Gynecology;  Laterality: N/A;   GIVENS CAPSULE STUDY N/A 05/16/2021   Procedure: GIVENS CAPSULE STUDY;  Surgeon: Charlott Rakes, MD;  Location: [facility];  Service: Endoscopy;  Laterality: N/A;   XI ROBOT ASSISTED RECTOPEXY N/A 07/07/2021   Procedure: XI ROBOT ASSISTED RECTOPEXY;  Surgeon: Karie Soda, MD;  Location: [facility];  Service: General;  Laterality: N/A;   XI ROBOTIC ASSISTED LOWER ANTERIOR RESECTION N/A 07/07/2021   Procedure: XI ROBOTIC ASSISTED LOW ANTERIOR RECTOSIGMOID RESECTION WITH RIGID PROCTOSCOPY, TAP BLOCK;  Surgeon: Karie Soda, MD;  Location: [facility];  Service: General;  Laterality: N/A;    There were no vitals filed for this visit.   Subjective Assessment - 01/19/23 1733     Currently in Pain? No/denies    Pain Score 0-No pain                Group Session:  S: Feeling good today.   O: During today's OT group session, the patient participated in an educational segment about the importance of goal-setting and the application of  the SMART framework to enhance daily life, particularly focusing on ADLs and iADLs. The session began with five open-ended pre-session questions that facilitated group discussion and introspection about their current relationship with goals. Following the introduction and educational segment, participants engaged in brainstorming and group discussions to devise hypothetical SMART goals. The session concluded with five post-session questions to reinforce understanding and facilitate reflection. Throughout the session, there was a range of engagement levels noted among the participants.   A:  Patient demonstrated a high level of engagement throughout the  session. They actively participated in discussions, sharing personal experiences related to goal setting and challenges faced. Patient was able to clearly articulate an understanding of the SMART framework and proposed personal SMART goals related to their own ADLs with minimal assistance. They expressed enthusiasm about applying what they learned to their daily routine and appeared motivated to make changes.    P: Continue to attend PHP OT group sessions 5x week for 4 weeks to promote daily structure, social engagement, and opportunities to develop and utilize adaptive strategies to maximize functional performance in preparation for safe transition and integration back into school, work, and the community. Plan to address topic of Routines 1 in next OT group session.                   OT Education - 01/19/23 1733     Education Details SMART Goals 4              OT Short Term Goals - 01/17/23 1930       OT SHORT TERM GOAL #1   Title Client will develop and utilize a personalized coping toolbox containing at least five coping strategies to manage challenging situations, demonstrating their use in real-life scenarios by the end of therapy.    Time 4    Period Weeks    Status On-going    Target Date 02/16/23      OT SHORT TERM GOAL #2   Title Client will independently identify and modify three areas of the current routine that contribute to increased stress or dysfunction by the end of therapy.    Time 4    Period Weeks    Status On-going    Target Date 02/16/23      OT SHORT TERM GOAL #3   Title By the time of discharge, client will independently set, track, and make progress towards a long-term goal, demonstrating resilience in overcoming obstacles and seeking support when needed.    Time 4    Period Weeks    Status On-going                      Plan - 01/19/23 1734     Psychosocial Skills Routines and Behaviors;Interpersonal  Interaction;Habits;Coping Strategies             Patient will benefit from skilled therapeutic intervention in order to improve the following deficits and impairments:       Psychosocial Skills: Routines and Behaviors, Interpersonal Interaction, Habits, Coping Strategies   Visit Diagnosis: Difficulty coping    Problem List Patient Active Problem List   Diagnosis Date Noted   MDD (major depressive disorder), recurrent severe, without psychosis (HCC) 12/28/2022   CKD stage 3a, GFR 45-59 ml/min (HCC) 12/25/2022   Chronic anemia 12/25/2022   Major depressive disorder, recurrent episode, severe (HCC) 12/25/2022   Generalized anxiety disorder 12/24/2022   Hypoglycemia 12/21/2022   Thyroid function test abnormal 10/04/2022   Dry mouth  10/03/2022   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   IBS (irritable bowel syndrome) 10/03/2022   Acute encephalopathy 06/06/2022   High anion gap metabolic acidosis 06/06/2022   Complete rectal prolapse 07/07/2021   Anemia, iron deficiency 05/13/2021   Hypokalemia 05/13/2021   Diarrhea 04/14/2021   Intractable vomiting with nausea 04/14/2021   UTI (urinary tract infection) 04/14/2021   Sepsis (HCC) 04/13/2021   Atypical squamous cells of undetermined significance on cytologic smear of cervix (ASC-US) 02/24/2021   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   Anxiety 02/24/2021   Heart murmur 02/24/2021   Menorrhagia 02/24/2021   Thyroid nodule 10/26/2020   Acute renal failure superimposed on stage 2 chronic kidney disease (HCC)    Hypotension    Hypovolemic shock (HCC) 08/29/2020   Rectal prolapse 04/05/2020   Migraine 02/27/2019   Mild episode of recurrent major depressive disorder (HCC) 10/25/2018   Abnormal thyroid stimulating hormone (TSH) level 10/24/2018   Syncope and collapse 10/10/2018   Lung nodules 11/14/2015   Paroxysmal SVT (supraventricular tachycardia) 11/13/2015   Attention deficit hyperactivity disorder (ADHD) 11/13/2015    Ted Mcalpine,  OT 01/19/2023, 5:34 PM   Kerrin Champagne, OT  [facility] HOSPITALIZATION PROGRAM [address] [place], [place], [address] Phone: [phone]   Fax:  [phone]  Name: Emily Park MRN: [id number] Date of Birth: [birth year]

## 2023-01-19 NOTE — Therapy (Signed)
[facility] PARTIAL HOSPITALIZATION PROGRAM [address] [place], [place], [address] Phone: [phone]   Fax:  [phone]  Occupational Therapy Treatment Virtual Visit via Video Note  I connected with Emily Park on 01/19/23 at  8:00 AM EDT by a video enabled telemedicine application and verified that I am speaking with the correct person using two identifiers.  Location: Patient: home Provider: office   I discussed the limitations of evaluation and management by telemedicine and the availability of in person appointments. The patient expressed understanding and agreed to proceed.    The patient was advised to call back or seek an in-person evaluation if the symptoms worsen or if the condition fails to improve as anticipated.  I provided 55 minutes of non-face-to-face time during this encounter.   Patient Details  Name: Emily Park Date of Birth: 09-16-1970 No data recorded  Encounter Date: 01/18/2023   OT End of Session - 01/19/23 1730     Visit Number 3    Number of Visits 20    Date for OT Re-Evaluation 02/16/23    OT Start Time 1200    OT Stop Time 1255    OT Time Calculation (min) 55 min             Past Medical History:  Diagnosis Date   Abnormal TSH    ADD (attention deficit disorder)    Anemia    Anxiety    Arrhythmia    Decreased libido    Depression    Heart murmur    Hyperlipidemia    Hypokalemia    Migraine    Nephrolithiasis    Pap smear abnormality of cervix/human papillomavirus (HPV) positive    Sleep difficulties    SVT (supraventricular tachycardia)     Past Surgical History:  Procedure Laterality Date   ABLATION     Cardiac ablation, endometrial ablation   BREAST LUMPECTOMY     right breast   BREAST LUMPECTOMY Right    CARDIOVERSION     COLONOSCOPY WITH PROPOFOL N/A 05/16/2021   Procedure: COLONOSCOPY WITH PROPOFOL;  Surgeon: Charlott Rakes, MD;  Location: [facility];   Service: Endoscopy;  Laterality: N/A;   COLPOSCOPY     w/ cervical biopsy   DILITATION & CURRETTAGE/HYSTROSCOPY WITH NOVASURE ABLATION N/A 09/11/2014   Procedure: DILATATION & CURETTAGE/HYSTEROSCOPY WITH NOVASURE ABLATION;  Surgeon: Lenoard Aden, MD;  Location: [facility];  Service: Gynecology;  Laterality: N/A;   GIVENS CAPSULE STUDY N/A 05/16/2021   Procedure: GIVENS CAPSULE STUDY;  Surgeon: Charlott Rakes, MD;  Location: [facility];  Service: Endoscopy;  Laterality: N/A;   XI ROBOT ASSISTED RECTOPEXY N/A 07/07/2021   Procedure: XI ROBOT ASSISTED RECTOPEXY;  Surgeon: Karie Soda, MD;  Location: [facility];  Service: General;  Laterality: N/A;   XI ROBOTIC ASSISTED LOWER ANTERIOR RESECTION N/A 07/07/2021   Procedure: XI ROBOTIC ASSISTED LOW ANTERIOR RECTOSIGMOID RESECTION WITH RIGID PROCTOSCOPY, TAP BLOCK;  Surgeon: Karie Soda, MD;  Location: [facility];  Service: General;  Laterality: N/A;    There were no vitals filed for this visit.   Subjective Assessment - 01/19/23 1730     Currently in Pain? No/denies    Pain Score 0-No pain                  Group Session:  S: Feeling better today I believe.   O: During today's OT group session, the patient participated in an educational segment about the importance of goal-setting  and the application of the SMART framework to enhance daily life, particularly focusing on ADLs and iADLs. The session began with five open-ended pre-session questions that facilitated group discussion and introspection about their current relationship with goals. Following the introduction and educational segment, participants engaged in brainstorming and group discussions to devise hypothetical SMART goals. The session concluded with five post-session questions to reinforce understanding and facilitate reflection. Throughout the session, there was a range of engagement levels noted among the participants.   A:  Patient demonstrated a high level of engagement  throughout the session. They actively participated in discussions, sharing personal experiences related to goal setting and challenges faced. Patient was able to clearly articulate an understanding of the SMART framework and proposed personal SMART goals related to their own ADLs with minimal assistance. They expressed enthusiasm about applying what they learned to their daily routine and appeared motivated to make changes.   P: Continue to attend PHP OT group sessions 5x week for 4 weeks to promote daily structure, social engagement, and opportunities to develop and utilize adaptive strategies to maximize functional performance in preparation for safe transition and integration back into school, work, and the community. Plan to address topic of pt 4 in next OT group session.                 OT Education - 01/19/23 1730     Education Details SMART Goals 3              OT Short Term Goals - 01/17/23 1930       OT SHORT TERM GOAL #1   Title Client will develop and utilize a personalized coping toolbox containing at least five coping strategies to manage challenging situations, demonstrating their use in real-life scenarios by the end of therapy.    Time 4    Period Weeks    Status On-going    Target Date 02/16/23      OT SHORT TERM GOAL #2   Title Client will independently identify and modify three areas of the current routine that contribute to increased stress or dysfunction by the end of therapy.    Time 4    Period Weeks    Status On-going    Target Date 02/16/23      OT SHORT TERM GOAL #3   Title By the time of discharge, client will independently set, track, and make progress towards a long-term goal, demonstrating resilience in overcoming obstacles and seeking support when needed.    Time 4    Period Weeks    Status On-going                      Plan - 01/19/23 1731     Psychosocial Skills Routines and Behaviors;Interpersonal  Interaction;Habits;Coping Strategies             Patient will benefit from skilled therapeutic intervention in order to improve the following deficits and impairments:       Psychosocial Skills: Routines and Behaviors, Interpersonal Interaction, Habits, Coping Strategies   Visit Diagnosis: Difficulty coping    Problem List Patient Active Problem List   Diagnosis Date Noted   MDD (major depressive disorder), recurrent severe, without psychosis (HCC) 12/28/2022   CKD stage 3a, GFR 45-59 ml/min (HCC) 12/25/2022   Chronic anemia 12/25/2022   Major depressive disorder, recurrent episode, severe (HCC) 12/25/2022   Generalized anxiety disorder 12/24/2022   Hypoglycemia 12/21/2022   Thyroid function test abnormal 10/04/2022   Dry  mouth 10/03/2022   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   IBS (irritable bowel syndrome) 10/03/2022   Acute encephalopathy 06/06/2022   High anion gap metabolic acidosis 06/06/2022   Complete rectal prolapse 07/07/2021   Anemia, iron deficiency 05/13/2021   Hypokalemia 05/13/2021   Diarrhea 04/14/2021   Intractable vomiting with nausea 04/14/2021   UTI (urinary tract infection) 04/14/2021   Sepsis (HCC) 04/13/2021   Atypical squamous cells of undetermined significance on cytologic smear of cervix (ASC-US) 02/24/2021   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   Anxiety 02/24/2021   Heart murmur 02/24/2021   Menorrhagia 02/24/2021   Thyroid nodule 10/26/2020   Acute renal failure superimposed on stage 2 chronic kidney disease (HCC)    Hypotension    Hypovolemic shock (HCC) 08/29/2020   Rectal prolapse 04/05/2020   Migraine 02/27/2019   Mild episode of recurrent major depressive disorder (HCC) 10/25/2018   Abnormal thyroid stimulating hormone (TSH) level 10/24/2018   Syncope and collapse 10/10/2018   Lung nodules 11/14/2015   Paroxysmal SVT (supraventricular tachycardia) 11/13/2015   Attention deficit hyperactivity disorder (ADHD) 11/13/2015    Emily Park,  OT 01/19/2023, 5:31 PM  Emily Park, OT   [facility] HOSPITALIZATION PROGRAM [address] [place], [place], [address] Phone: [phone]   Fax:  [phone]  Name: Emily Park MRN: Park Date of Birth: 1971/03/14

## 2023-01-22 ENCOUNTER — Other Ambulatory Visit (HOSPITAL_COMMUNITY): Payer: Commercial Managed Care - PPO

## 2023-01-23 ENCOUNTER — Other Ambulatory Visit (HOSPITAL_COMMUNITY): Payer: Commercial Managed Care - PPO

## 2023-01-24 ENCOUNTER — Other Ambulatory Visit (HOSPITAL_COMMUNITY): Payer: Commercial Managed Care - PPO

## 2023-01-24 ENCOUNTER — Other Ambulatory Visit (HOSPITAL_COMMUNITY): Payer: Commercial Managed Care - PPO | Admitting: Licensed Clinical Social Worker

## 2023-01-24 DIAGNOSIS — R4589 Other symptoms and signs involving emotional state: Secondary | ICD-10-CM

## 2023-01-24 DIAGNOSIS — F332 Major depressive disorder, recurrent severe without psychotic features: Secondary | ICD-10-CM

## 2023-01-24 DIAGNOSIS — F411 Generalized anxiety disorder: Secondary | ICD-10-CM

## 2023-01-25 ENCOUNTER — Other Ambulatory Visit (HOSPITAL_COMMUNITY): Payer: Commercial Managed Care - PPO | Admitting: Licensed Clinical Social Worker

## 2023-01-25 ENCOUNTER — Other Ambulatory Visit (HOSPITAL_COMMUNITY): Payer: Commercial Managed Care - PPO

## 2023-01-25 ENCOUNTER — Encounter (HOSPITAL_COMMUNITY): Payer: Self-pay

## 2023-01-25 DIAGNOSIS — F411 Generalized anxiety disorder: Secondary | ICD-10-CM

## 2023-01-25 DIAGNOSIS — F332 Major depressive disorder, recurrent severe without psychotic features: Secondary | ICD-10-CM

## 2023-01-25 MED ORDER — BUSPIRONE HCL 5 MG PO TABS: 5.0000 mg | ORAL_TABLET | Freq: Two times a day (BID) | ORAL | 0 refills | Status: DC

## 2023-01-25 NOTE — Therapy (Signed)
[facility] PARTIAL HOSPITALIZATION PROGRAM [address] [place], [place], [address] Phone: [phone]   Fax:  [phone]  Occupational Therapy Treatment Virtual Visit via Video Note  I connected with Emily Park on 01/25/23 at  8:00 AM EDT by a video enabled telemedicine application and verified that I am speaking with the correct person using two identifiers.  Location: Patient: home Provider: office   I discussed the limitations of evaluation and management by telemedicine and the availability of in person appointments. The patient expressed understanding and agreed to proceed.    The patient was advised to call back or seek an in-person evaluation if the symptoms worsen or if the condition fails to improve as anticipated.  I provided 55 minutes of non-face-to-face time during this encounter.   Patient Details  Name: Emily Park MRN: [id number] Date of Birth: [birth year] No data recorded  Encounter Date: 01/24/2023   OT End of Session - 01/25/23 1736     Visit Number 5    Number of Visits 20    Date for OT Re-Evaluation 02/16/23    OT Start Time 1200    OT Stop Time 1255    OT Time Calculation (min) 55 min             Past Medical History:  Diagnosis Date   Abnormal TSH    ADD (attention deficit disorder)    Anemia    Anxiety    Arrhythmia    Decreased libido    Depression    Heart murmur    Hyperlipidemia    Hypokalemia    Migraine    Nephrolithiasis    Pap smear abnormality of cervix/human papillomavirus (HPV) positive    Sleep difficulties    SVT (supraventricular tachycardia)     Past Surgical History:  Procedure Laterality Date   ABLATION     Cardiac ablation, endometrial ablation   BREAST LUMPECTOMY     right breast   BREAST LUMPECTOMY Right    CARDIOVERSION     COLONOSCOPY WITH PROPOFOL N/A 05/16/2021   Procedure: COLONOSCOPY WITH PROPOFOL;  Surgeon: Charlott Rakes, MD;  Location: [facility];   Service: Endoscopy;  Laterality: N/A;   COLPOSCOPY     w/ cervical biopsy   DILITATION & CURRETTAGE/HYSTROSCOPY WITH NOVASURE ABLATION N/A 09/11/2014   Procedure: DILATATION & CURETTAGE/HYSTEROSCOPY WITH NOVASURE ABLATION;  Surgeon: Lenoard Aden, MD;  Location: [facility];  Service: Gynecology;  Laterality: N/A;   GIVENS CAPSULE STUDY N/A 05/16/2021   Procedure: GIVENS CAPSULE STUDY;  Surgeon: Charlott Rakes, MD;  [facility]: [facility];  Service: Endoscopy;  Laterality: N/A;   XI ROBOT ASSISTED RECTOPEXY N/A 07/07/2021   Procedure: XI ROBOT ASSISTED RECTOPEXY;  Surgeon: Karie Soda, MD;  Location: [facility];  Service: General;  Laterality: N/A;   XI ROBOTIC ASSISTED LOWER ANTERIOR RESECTION N/A 07/07/2021   Procedure: XI ROBOTIC ASSISTED LOW ANTERIOR RECTOSIGMOID RESECTION WITH RIGID PROCTOSCOPY, TAP BLOCK;  Surgeon: Karie Soda, MD;  Location: [facility];  Service: General;  Laterality: N/A;    There were no vitals filed for this visit.   Subjective Assessment - 01/25/23 1735     Currently in Pain? No/denies    Pain Score 0-No pain                  Group Session:  S: Feeling better today.   O: The objective of the telehealth group therapy session was to discuss the significance of routines in promoting mental health  and wellbeing. The OT aimed to explore the power of routines in providing structure, stability, and predictability in individuals' lives, as well as their role in establishing healthy habits, reducing stress and anxiety, managing time effectively, achieving goals, and fostering a sense of community and social connectedness.   Participants were guided to identify areas where routines could improve their mental health and were provided with tips for establishing and maintaining healthy routines. The session concluded by emphasizing the potential of routines to enhance overall quality of life.  Homework Assignment:  As part of the session, participants were assigned a  homework task to reflect on their current routines and select one area of their lives where they could establish a new routine to improve their mental health and wellbeing. They were instructed to start small and implement the routine gradually, while seeking support from friends, family, or mental health professionals as needed. The participants were asked to report their progress in the next therapy session, focusing on the benefits and challenges encountered during the implementation of their chosen routine.   A: During the telehealth group therapy session, the patient actively participated in the discussion on the importance of routines in promoting mental health and wellbeing. The patient demonstrated a good understanding of the power of routines in providing structure, stability, and predictability in their life. They were able to identify areas in their life where routines could contribute to improving their overall mental health.  The patient showed motivation and willingness to reflect on their current habits and behaviors, recognizing the need for positive changes. They actively engaged in the session, sharing personal experiences and challenges related to establishing and maintaining healthy routines. The patient expressed a desire to reduce stress and anxiety, manage their time more effectively, and achieve their goals through the implementation of routines.    P: Continue to attend PHP OT group sessions 5x week for 4 weeks to promote daily structure, social engagement, and opportunities to develop and utilize adaptive strategies to maximize functional performance in preparation for safe transition and integration back into school, work, and the community. Plan to address topic of pt 3 in next OT group session.                 OT Education - 01/25/23 1735     Education Details Routines 2              OT Short Term Goals - 01/17/23 1930       OT SHORT TERM GOAL #1    Title Client will develop and utilize a personalized coping toolbox containing at least five coping strategies to manage challenging situations, demonstrating their use in real-life scenarios by the end of therapy.    Time 4    Period Weeks    Status On-going    Target Date 02/16/23      OT SHORT TERM GOAL #2   Title Client will independently identify and modify three areas of the current routine that contribute to increased stress or dysfunction by the end of therapy.    Time 4    Period Weeks    Status On-going    Target Date 02/16/23      OT SHORT TERM GOAL #3   Title By the time of discharge, client will independently set, track, and make progress towards a long-term goal, demonstrating resilience in overcoming obstacles and seeking support when needed.    Time 4    Period Weeks    Status On-going  Plan - 01/25/23 1736     Psychosocial Skills Routines and Behaviors;Interpersonal Interaction;Habits;Coping Strategies             Patient will benefit from skilled therapeutic intervention in order to improve the following deficits and impairments:       Psychosocial Skills: Routines and Behaviors, Interpersonal Interaction, Habits, Coping Strategies   Visit Diagnosis: Difficulty coping    Problem List Patient Active Problem List   Diagnosis Date Noted   MDD (major depressive disorder), recurrent severe, without psychosis (HCC) 12/28/2022   CKD stage 3a, GFR 45-59 ml/min (HCC) 12/25/2022   Chronic anemia 12/25/2022   Major depressive disorder, recurrent episode, severe (HCC) 12/25/2022   Generalized anxiety disorder 12/24/2022   Hypoglycemia 12/21/2022   Thyroid 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   IBS (irritable bowel syndrome) 10/03/2022   Acute encephalopathy 06/06/2022   High anion gap metabolic acidosis 06/06/2022   Complete rectal prolapse 07/07/2021   Anemia, iron  deficiency 05/13/2021   Hypokalemia 05/13/2021   Diarrhea 04/14/2021   Intractable vomiting with nausea 04/14/2021   UTI (urinary tract infection) 04/14/2021   Sepsis (HCC) 04/13/2021   Atypical squamous cells of undetermined significance on cytologic smear of cervix (ASC-US) 02/24/2021   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   Anxiety 02/24/2021   Heart murmur 02/24/2021   Menorrhagia 02/24/2021   Thyroid nodule 10/26/2020   Acute renal failure superimposed on stage 2 chronic kidney disease (HCC)    Hypotension    Hypovolemic shock (HCC) 08/29/2020   Rectal prolapse 04/05/2020   Migraine 02/27/2019   Mild episode of recurrent major depressive disorder (HCC) 10/25/2018   Abnormal thyroid stimulating hormone (TSH) level 10/24/2018   Syncope and collapse 10/10/2018   Lung nodules 11/14/2015   Paroxysmal SVT (supraventricular tachycardia) 11/13/2015   Attention deficit hyperactivity disorder (ADHD) 11/13/2015    Ted Mcalpine, OT 01/25/2023, 5:38 PM  Kerrin Champagne, OT   [facility] HOSPITALIZATION PROGRAM [address] [place], [place], [address] Phone: [phone]   Fax:  [phone]  Name: Emily Park MRN: [id number] Date of Birth: 1971-07-15

## 2023-01-25 NOTE — Progress Notes (Signed)
Virtual Visit via Video Note  I connected with Emily Park on 01/27/23 at  9:00 AM EDT by a video enabled telemedicine application and verified that I am speaking with the correct person using two identifiers.  Location: Patient: Home Provider: Office   I discussed the limitations of evaluation and management by telemedicine and the availability of in person appointments. The patient expressed understanding and agreed to proceed.   I discussed the assessment and treatment plan with the patient. The patient was provided an opportunity to ask questions and all were answered. The patient agreed with the plan and demonstrated an understanding of the instructions.   The patient was advised to call back or seek an in-person evaluation if the symptoms worsen or if the condition fails to improve as anticipated.  I provided 15 minutes of non-face-to-face time during this encounter.   [name], NP   BH MD/PA/NP OP Progress Note  01/27/2023 11:24 AM Emily Park  MRN:  [id number]  Chief Complaint:  Worsening Depression  HPI:  Emily Park 52 year old female was seen and evaluated for weekly assessment via video tele-assessment .  She presents with a bright and pleasant affect.  She is denying suicidal or homicidal ideations.  Denies auditory visual hallucinations.  States she is feeling a Bit better since attending daily group sessions.  States she has been working on Pharmacologist such as deep breathing trying to journal states she recently started knitting again.  She states she has plans to continue walking 4 miles daily to help with her mental health.  She does reports she is taking her medications as indicated.  Rating her depression 4/10 with 10 being the worst.  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  Would like to initiate something different for her anxiety.  Discussed initiating BuSpar 5 mg p.o. twice daily.  Patient was  receptive to plan.  Psychiatry to follow-up for medication adherence and efficacy.  She was receptive to plan.  During evaluation Emily Park is sitting; she is alert/oriented x 4; calm/cooperative; and mood congruent with affect.  Patient is speaking in a clear tone at moderate volume, and normal pace; with good eye contact. Her thought process is coherent and relevant; There is no indication that she is currently responding to internal/external stimuli or experiencing delusional thought content.  Patient denies suicidal/self-harm/homicidal ideation, psychosis, and paranoia.  Patient has remained calm throughout assessment and has answered questions appropriately.    Visit Diagnosis:    ICD-10-CM   1. MDD (major depressive disorder), recurrent severe, without psychosis (HCC)  F33.2     2. Generalized anxiety disorder  F41.1       Past Psychiatric History:   Past Medical History:  Past Medical History:  Diagnosis Date   Abnormal TSH    ADD (attention deficit disorder)    Anemia    Anxiety    Arrhythmia    Decreased libido    Depression    Heart murmur    Hyperlipidemia    Hypokalemia    Migraine    Nephrolithiasis    Pap smear abnormality of cervix/human papillomavirus (HPV) positive    Sleep difficulties    SVT (supraventricular tachycardia)     Past Surgical History:  Procedure Laterality Date   ABLATION     Cardiac ablation, endometrial ablation   BREAST LUMPECTOMY     right breast   BREAST LUMPECTOMY Right  CARDIOVERSION     COLONOSCOPY WITH PROPOFOL N/A 05/16/2021   Procedure: COLONOSCOPY WITH PROPOFOL;  Surgeon: Charlott Rakes, MD;  [facility];  Service: Endoscopy;  Laterality: N/A;   COLPOSCOPY     w/ cervical biopsy   DILITATION & CURRETTAGE/HYSTROSCOPY WITH NOVASURE ABLATION N/A 09/11/2014   Procedure: DILATATION & CURETTAGE/HYSTEROSCOPY WITH NOVASURE ABLATION;  Surgeon: Lenoard Aden, MD;  Location: [facility];  Service: Gynecology;   Laterality: N/A;   GIVENS CAPSULE STUDY N/A 05/16/2021   Procedure: GIVENS CAPSULE STUDY;  Surgeon: Charlott Rakes, MD;  [facility];  Service: Endoscopy;  Laterality: N/A;   XI ROBOT ASSISTED RECTOPEXY N/A 07/07/2021   Procedure: XI ROBOT ASSISTED RECTOPEXY;  Surgeon: Karie Soda, MD;  [facility]: [facility];  Service: General;  Laterality: N/A;   XI ROBOTIC ASSISTED LOWER ANTERIOR RESECTION N/A 07/07/2021   Procedure: XI ROBOTIC ASSISTED LOW ANTERIOR RECTOSIGMOID RESECTION WITH RIGID PROCTOSCOPY, TAP BLOCK;  Surgeon: Karie Soda, MD;  [facility]: [facility];  Service: General;  Laterality: N/A;    Family Psychiatric History:   Family History:  Family History  Problem Relation Age of Onset   Hypothyroidism Mother    Depression Mother    Cancer Mother        thyroid   Thyroid disease Mother    Alcohol abuse Father    Hypertension Father    Heart attack Father        Age 8   AAA (abdominal aortic aneurysm) Father    Depression Sister    Hypothyroidism Sister    Hyperthyroidism Sister    Diabetes Maternal Grandfather    Depression Maternal Grandmother    Breast cancer Paternal Grandmother    Depression Cousin     Social History:  Social History   Socioeconomic History   Marital status: Married    Spouse name: Not on file   Number of children: 2   Years of education: college   Highest education level: Associate degree: academic program  Occupational History   Occupation: Charity fundraiser with Engineer, manufacturing systems: [employer]   Tobacco Use   Smoking status: Never    Passive exposure: Current   Smokeless tobacco: Never  Vaping Use   Vaping Use: Never used  Substance and Sexual Activity   Alcohol use: Not Currently    Alcohol/week: 2.0 standard drinks of alcohol    Types: 2 Glasses of wine per week   Drug use: Not Currently   Sexual activity: Yes    Partners: Male    Comment: Married  Other Topics Concern   Not on file  Social History Narrative   Lives at home with two sons.    Right-handed.   60 ounces of tea and soda per day.   Social Determinants of Health   Financial Resource Strain: Not on file  Food Insecurity: Food Insecurity Present (12/28/2022)   Hunger Vital Sign    Worried About Running Out of Food in the Last Year: Sometimes true    Ran Out of Food in the Last Year: Sometimes true  Transportation Needs: No Transportation Needs (12/28/2022)   PRAPARE - Administrator, Civil Service (Medical): No    Lack of Transportation (Non-Medical): No  Physical Activity: Not on file  Stress: Not on file  Social Connections: Not on file    Allergies:  Allergies  Allergen Reactions   Zithromax [Azithromycin] Rash    Metabolic Disorder Labs: Lab Results  Component Value Date   HGBA1C 4.9 12/20/2022  MPG 93.93 12/20/2022   MPG 93.93 07/06/2021   No results found for: "PROLACTIN" Lab Results  Component Value Date   CHOL 208 (H) 02/24/2014   TRIG 81.0 02/24/2014   HDL 54.90 02/24/2014   CHOLHDL 4 02/24/2014   VLDL 16.2 02/24/2014   LDLCALC 137 (H) 02/24/2014   Lab Results  Component Value Date   TSH 0.175 (L) 12/21/2022   TSH 0.67 10/03/2022    Therapeutic Level Labs: No results found for: "LITHIUM" No results found for: "VALPROATE" No results found for: "CBMZ"  Current Medications: Current Outpatient Medications  Medication Sig Dispense Refill   busPIRone (BUSPAR) 5 MG tablet Take 1 tablet (5 mg total) by mouth 2 (two) times daily. 60 tablet 0   ACCRUFER 30 MG CAPS Take 1 capsule by mouth 2 (two) times daily. (Patient not taking: Reported on 01/19/2023)     Ascorbic Acid (VITAMIN C PO) Take 1 tablet by mouth every morning. (Patient not taking: Reported on 01/19/2023)     Cholecalciferol (VITAMIN D3) 50 MCG (2000 UT) capsule Take 2,000 Units by mouth daily. (Patient not taking: Reported on 01/19/2023)     clonazePAM (KLONOPIN) 1 MG tablet Take 1 mg by mouth at bedtime.     doxepin (SINEQUAN) 75 MG capsule Take 75 mg by mouth at  bedtime.     hydrocortisone (ANUSOL-HC) 2.5 % rectal cream Apply 1 Application topically 4 (four) times daily as needed for hemorrhoids. (Patient not taking: Reported on 01/19/2023) 30 g 0   hydrOXYzine (ATARAX) 25 MG tablet Take 1 tablet (25 mg total) by mouth 3 (three) times daily as needed for anxiety (sleep). (Patient not taking: Reported on 01/19/2023) 30 tablet 0   KLOR-CON M20 20 MEQ tablet TAKE 1 TABLET BY MOUTH EVERY DAY 90 tablet 1   linaclotide (LINZESS) 145 MCG CAPS capsule Take 1 capsule (145 mcg total) by mouth daily before breakfast. 30 capsule 0   loratadine (CLARITIN) 10 MG tablet Take 1 tablet (10 mg total) by mouth daily as needed for allergies. (Patient not taking: Reported on 01/19/2023) 30 tablet 0   Multiple Vitamin (MULTIVITAMIN WITH MINERALS) TABS tablet Take 1 tablet by mouth every morning.     pantoprazole (PROTONIX) 40 MG tablet Take 40 mg by mouth 2 (two) times daily.     polyethylene glycol (MIRALAX / GLYCOLAX) 17 g packet Take 17 g by mouth 2 (two) times daily. (Patient taking differently: Take 17 g by mouth daily as needed for moderate constipation.) 14 each 0   polyvinyl alcohol (LIQUIFILM TEARS) 1.4 % ophthalmic solution Place 1 drop into both eyes as needed for dry eyes. 15 mL 0   rizatriptan (MAXALT) 10 MG tablet PLEASE SEE ATTACHED FOR DETAILED DIRECTIONS     SODIUM FLUORIDE 5000 PPM 1.1 % PSTE PLEASE SEE ATTACHED FOR DETAILED DIRECTIONS (Patient not taking: Reported on 01/19/2023)     topiramate (TOPAMAX) 100 MG tablet Take 1 tablet (100 mg total) by mouth daily. 30 tablet 0   vortioxetine HBr (TRINTELLIX) 20 MG TABS tablet Take 1 tablet (20 mg total) by mouth daily. 30 tablet 0   No current facility-administered medications for this visit.     Musculoskeletal: Strength & Muscle Tone: within normal limits Gait & Station: normal Patient leans: N/A  Psychiatric Specialty Exam: Review of Systems  Respiratory: Negative.    Cardiovascular: Negative.    Psychiatric/Behavioral:  Negative for self-injury and sleep disturbance. The patient is nervous/anxious.   All other systems reviewed and are negative.  There were no vitals taken for this visit.There is no height or weight on file to calculate BMI.  General Appearance: Casual  Eye Contact:  Good  Speech:  Clear and Coherent  Volume:  Normal  Mood:  Anxious  Affect:  Congruent  Thought Process:  Coherent  Orientation:  Full (Time, Place, and Person)  Thought Content: Logical   Suicidal Thoughts:  No  Homicidal Thoughts:  No  Memory:  Immediate;   Good Recent;   Good  Judgement:  Good  Insight:  Good  Psychomotor Activity:  Normal  Concentration:  Concentration: Good  Recall:  Good  Fund of Knowledge: Good  Language: Good  Akathisia:  No  Handed:  Right  AIMS (if indicated): done  Assets:  Communication Skills Desire for Improvement Resilience Social Support  ADL's:  Intact  Cognition: WNL  Sleep:  Good   Screenings: AIMS    Flowsheet Row [facility]  AIMS Total Score 0      AUDIT    Flowsheet Row [facility]  Alcohol Use Disorder Identification Test Final Score (AUDIT) 1      GAD-7    Flowsheet Row Counselor from 01/09/2023 in BE[facility]AV[facility]AL [facility]ALTH PARTIAL HOSPITALIZATION PROGRAM Office Visit from 11/22/2022 in [facility] [place] [facility]  Total GAD-7 Score 15 12      PHQ2-9    Flowsheet Row Counselor from 01/19/2023 in [facility] PARTIAL HOSPITALIZATION PROGRAM Counselor from 01/09/2023 in [facility] PARTIAL HOSPITALIZATION PROGRAM Office Visit from 11/22/2022 in [facility] [place] [facility] [place] Office Visit from 10/03/2022 in [facility] [place] [facility] [place]  PHQ-2 Total Score 2 2 2  0  PHQ-9 Total Score 11 12 7  --      Flowsheet Row Counselor from  01/19/2023 in [facility] PARTIAL HOSPITALIZATION PROGRAM Counselor from 01/09/2023 in [facility] PARTIAL HOSPITALIZATION PROGRAM Ad[facility] (Discharged[facility]  C-SSRS RISK CATEGORY Error: Question 6 not populated Error: Q3, 4, or 5 should not be populated when Q2 is No High Risk        Assessment and Plan:  Patient to continue partial hospitalization program -Patient to start BuSpar 5 mg p.o. twice daily -Keep all outpatient follow-up appointments   Collaboration of Care: Collaboration of Care: Medication Management AEB initiated BuSpar psychiatry to continue to titrate where appropriate  Patient/Guardian was advised Release of Information must be obtained prior to any record release in order to collaborate their care with an outside provider. Patient/Guardian was advised if they have not already done so to contact the registration department to sign all necessary forms in order for [name] to release information regarding their care.   Consent: Patient/Guardian gives verbal consent for treatment and assignment of benefits for services provided during this visit. Patient/Guardian expressed understanding and agreed to proceed.    [name], NP 01/27/2023, 11:24 AM

## 2023-01-26 ENCOUNTER — Other Ambulatory Visit (HOSPITAL_COMMUNITY): Payer: Commercial Managed Care - PPO

## 2023-01-26 ENCOUNTER — Other Ambulatory Visit (HOSPITAL_COMMUNITY): Payer: Commercial Managed Care - PPO | Admitting: Licensed Clinical Social Worker

## 2023-01-26 VITALS — Ht 64.0 in | Wt 125.0 lb

## 2023-01-26 DIAGNOSIS — R4589 Other symptoms and signs involving emotional state: Secondary | ICD-10-CM

## 2023-01-26 DIAGNOSIS — F332 Major depressive disorder, recurrent severe without psychotic features: Secondary | ICD-10-CM

## 2023-01-26 DIAGNOSIS — F411 Generalized anxiety disorder: Secondary | ICD-10-CM

## 2023-01-26 NOTE — Psych (Signed)
Virtual Visit via Video Note  I connected with Emily Park on 01/17/23 at  9:00 AM EDT by a video enabled telemedicine application and verified that I am speaking with the correct person using two identifiers.  Location: Patient: patient home Provider: clinical home office   I discussed the limitations of evaluation and management by telemedicine and the availability of in person appointments. The patient expressed understanding and agreed to proceed.  I discussed the assessment and treatment plan with the patient. The patient was provided an opportunity to ask questions and all were answered. The patient agreed with the plan and demonstrated an understanding of the instructions.   The patient was advised to call back or seek an in-person evaluation if the symptoms worsen or if the condition fails to improve as anticipated.  Pt was provided 240 minutes of non-face-to-face time during this encounter.   Donia Guiles, LCSW   [facility] PHP THERAPIST PROGRESS NOTE  Emily Park [id number]  Session Time: 9:00 - 10:00  Participation Level: Active  Behavioral Response: CasualAlertDepressed  Type of Therapy: Group Therapy  Treatment Goals addressed: Coping  Progress Towards Goals: Initial  Interventions: CBT, DBT, Supportive, and Reframing  Summary: Emily Park is a 52 y.o. female who presents with depression and anxiety symptoms.  Clinician led check-in regarding current stressors and situation, and review of patient completed daily inventory. Clinician utilized active listening and empathetic response and validated patient emotions. Clinician facilitated processing group on pertinent issues.?    Therapist Response: Patient arrived within time allowed. Patient rates her mood at a 8 on a scale of 1-10 with 10 being best. Pt states she feels "pretty good." Pt states she slept 8 hours and ate 2x. Pt reports she had a dentist appt yesterday to fill cavities and felt off for the  rest of the day. Pt reports spending time at home afterwards. Pt reports struggle with managing mood. Patient able to process. Patient engaged in discussion.         Session Time: 10:00 am - 11:00 am   Participation Level: Active   Behavioral Response: CasualAlertDepressed   Type of Therapy: Group Therapy   Treatment Goals addressed: Coping   Progress Towards Goals: Progressing   Interventions: CBT, DBT, Solution Focused, Strength-based, Supportive, and Reframing   Therapist Response: Cln led processing group for pt's current struggles. Group members shared stressors and provided support and feedback. Cln brought in topics of boundaries, healthy relationships, and unhealthy thought processes to inform discussion.    Therapist Response: Pt able to process and provide support to group.          Session Time: 11:00 -12:00   Participation Level: Active   Behavioral Response: CasualAlertDepressed   Type of Therapy: Group Therapy, Spiritual Care   Treatment Goals addressed: Coping   Progress Towards Goals: Progressing   Interventions: Supportive, Education   Summary:  [name], Chaplain, led group.   Therapist Response: Pt participated       Session Time: 12:00 -1:00   Participation Level: Active   Behavioral Response: CasualAlertDepressed   Type of Therapy: Group therapy, Occupational Therapy   Treatment Goals addressed: Coping   Progress Towards Goals: Progressing   Interventions: Supportive; Psychoeducation   Summary: 12:00 - 12:50: Occupational Therapy group led by cln E. Hollan. 12:50 - 1:00 Clinician assessed for immediate needs, medication compliance and efficacy, and safety concerns.   Therapist Response: 12:00 - 12:50: See OT note 12:50 - 1:00 pm: At check-out, patient  reports no immediate concerns. Patient demonstrates progress as evidenced by continued engagement and responsiveness to treatment. Patient denies SI/HI/self-harm thoughts at the end  of group.    Suicidal/Homicidal: Nowithout intent/plan   Plan: Pt will continue in PHP while working to stabilize post-hospitalization, decrease depression and anxiety symptoms, and increase ability to manage symptoms in a healthy manner.   Collaboration of Care: Medication Management AEB J. McQuilla  Patient/Guardian was advised Release of Information must be obtained prior to any record release in order to collaborate their care with an outside provider. Patient/Guardian was advised if they have not already done so to contact the registration department to sign all necessary forms in order for [name] to release information regarding their care.   Consent: Patient/Guardian gives verbal consent for treatment and assignment of benefits for services provided during this visit. Patient/Guardian expressed understanding and agreed to proceed.   Diagnosis: MDD (major depressive disorder), recurrent severe, without psychosis (HCC) [F33.2]    1. MDD (major depressive disorder), recurrent severe, without psychosis (HCC)   2. Generalized anxiety disorder       Donia Guiles, LCSW

## 2023-01-26 NOTE — Psych (Signed)
Virtual Visit via Video Note  I connected with Emily Park on 01/16/23 at  9:00 AM EDT by a video enabled telemedicine application and verified that I am speaking with the correct person using two identifiers.  Location: Patient: patient home Provider: clinical home office   I discussed the limitations of evaluation and management by telemedicine and the availability of in person appointments. The patient expressed understanding and agreed to proceed.  I discussed the assessment and treatment plan with the patient. The patient was provided an opportunity to ask questions and all were answered. The patient agreed with the plan and demonstrated an understanding of the instructions.   The patient was advised to call back or seek an in-person evaluation if the symptoms worsen or if the condition fails to improve as anticipated.  Pt was provided 240 minutes of non-face-to-face time during this encounter.   Donia Guiles, LCSW   [facility] PHP THERAPIST PROGRESS NOTE  Emily Park [id number]  Session Time: 9:00 - 10:00  Participation Level: Did Not Attend  Behavioral Response: CasualAlertDepressed  Type of Therapy: Group Therapy  Treatment Goals addressed: Coping  Progress Towards Goals: Initial  Interventions: CBT, DBT, Supportive, and Reframing  Summary: Emily Park is a 52 y.o. female who presents with depression and anxiety symptoms.  Clinician led check-in regarding current stressors and situation, and review of patient completed daily inventory. Clinician utilized active listening and empathetic response and validated patient emotions. Clinician facilitated processing group on pertinent issues.?    Therapist Response: Patient arrived within time allowed. Patient met with psychiatrist during this session.       Session Time: 10:00 am - 11:00 am   Participation Level: Active   Behavioral Response: CasualAlertDepressed   Type of Therapy: Group Therapy    Treatment Goals addressed: Coping   Progress Towards Goals: Progressing   Interventions: CBT, DBT, Solution Focused, Strength-based, Supportive, and Reframing   Therapist Response: Cln led discussion on coping skills that can help in intense feelings. Cln discussed DBT TIP skills of temperature and intense exercise. Cln shared DBT ACCEPTS sensation skill. Group discussed how to apply these skills and how to incorporate them into their coping plans.   Therapist Response:  Pt engaged in discussion and is able to identify ways to practice skills discussed.          Session Time: 11:00 -12:00   Participation Level: Active   Behavioral Response: CasualAlertDepressed   Type of Therapy: Group Therapy   Treatment Goals addressed: Coping   Progress Towards Goals: Progressing   Interventions: CBT, DBT, Solution Focused, Strength-based, Supportive, and Reframing   Summary: Cln introduced the "Thoughts" distraction skills from DBT distress tolerance skill ACCEPTS. Cln discussed how this set of distraction skills can be helpful when in situations where outside resources are limited or unavailable. Group practiced and brainstormed ways to apply the Thought skill.    Therapist Response: Pt engaged in discussion and is able to determine ways to utilize skill.        Session Time: 12:00 -1:00   Participation Level: Active   Behavioral Response: CasualAlertDepressed   Type of Therapy: Group therapy, Occupational Therapy   Treatment Goals addressed: Coping   Progress Towards Goals: Progressing   Interventions: Supportive; Psychoeducation   Summary: 12:00 - 12:50: Occupational Therapy group led by cln E. Hollan. 12:50 - 1:00 Clinician assessed for immediate needs, medication compliance and efficacy, and safety concerns.   Therapist Response: 12:00 - 12:50: See OT note 12:50 -  1:00 pm: At check-out, patient reports no immediate concerns. Patient demonstrates progress as evidenced by  participation in first group session. Patient denies SI/HI/self-harm thoughts at the end of group.    Suicidal/Homicidal: Nowithout intent/plan   Plan: Pt will continue in PHP while working to stabilize post-hospitalization, decrease depression and anxiety symptoms, and increase ability to manage symptoms in a healthy manner.   Collaboration of Care: Medication Management AEB J. McQuilla  Patient/Guardian was advised Release of Information must be obtained prior to any record release in order to collaborate their care with an outside provider. Patient/Guardian was advised if they have not already done so to contact the registration department to sign all necessary forms in order for [name] to release information regarding their care.   Consent: Patient/Guardian gives verbal consent for treatment and assignment of benefits for services provided during this visit. Patient/Guardian expressed understanding and agreed to proceed.   Diagnosis: MDD (major depressive disorder), recurrent severe, without psychosis (HCC) [F33.2]    1. MDD (major depressive disorder), recurrent severe, without psychosis (HCC)   2. Generalized anxiety disorder       Donia Guiles, LCSW

## 2023-01-26 NOTE — Progress Notes (Unsigned)
Patient presented with appropriate affect, level and pleasant mood and denied any current thoughts of wanting to harm herself or others, no SI/HI plan, means or intent reported and stated she was feeling much improved.  Patient stated she is now walking 4 miles a week, knitting again and getting out to do things.  Stated she is no longer isolating and has a good support system.  Reviewed all pt's current medications and she is glad to be tapering off Clonazepam and has no problems with new Buspar medication she took for the first time the previous night.  Patient reported a good check up recently with PCP and cardiologist and no other concerns at this time.

## 2023-01-26 NOTE — Psych (Signed)
Virtual Visit via Video Note  I connected with Emily Park on 01/18/23 at  9:00 AM EDT by a video enabled telemedicine application and verified that I am speaking with the correct person using two identifiers.  Location: Patient: patient home Provider: clinical home office   I discussed the limitations of evaluation and management by telemedicine and the availability of in person appointments. The patient expressed understanding and agreed to proceed.  I discussed the assessment and treatment plan with the patient. The patient was provided an opportunity to ask questions and all were answered. The patient agreed with the plan and demonstrated an understanding of the instructions.   The patient was advised to call back or seek an in-person evaluation if the symptoms worsen or if the condition fails to improve as anticipated.  Pt was provided 240 minutes of non-face-to-face time during this encounter.   Donia Guiles, LCSW   [facility] PHP THERAPIST PROGRESS NOTE  Emily Park [id number]  Session Time: 9:00 - 10:00  Participation Level: Active  Behavioral Response: CasualAlertDepressed  Type of Therapy: Group Therapy  Treatment Goals addressed: Coping  Progress Towards Goals: Initial  Interventions: CBT, DBT, Supportive, and Reframing  Summary: Emily Park is a 52 y.o. female who presents with depression and anxiety symptoms.  Clinician led check-in regarding current stressors and situation, and review of patient completed daily inventory. Clinician utilized active listening and empathetic response and validated patient emotions. Clinician facilitated processing group on pertinent issues.?    Therapist Response: Patient arrived within time allowed. Patient rates her mood at a 6 on a scale of 1-10 with 10 being best. Pt states she feels "tired." Pt states she slept 6 hours and ate 2x. Pt reports she had a conflict with her husband last night that upset her. Pt reports  her anger interfered with her sleep and she has been up since 4:30 am ruminating. Pt reports they resolved the conflict later in the morning. Pt reports struggle with moving past her feelings.  Patient able to process. Patient engaged in discussion.         Session Time: 10:00 am - 11:00 am   Participation Level: Active   Behavioral Response: CasualAlertDepressed   Type of Therapy: Group Therapy   Treatment Goals addressed: Coping   Progress Towards Goals: Progressing   Interventions: CBT, DBT, Solution Focused, Strength-based, Supportive, and Reframing   Therapist Response: Cln led discussion on deep breathing and its therapeutic benefits, using DBT TIPP skills to inform discussion. Group practiced how to breathe from their diaphragms to ensure therapeutic quality and different ways to keep track of regulating breaths.    Therapist Response: Pt engaged in discussion and practice.          Session Time: 11:00 -12:00   Participation Level: Active   Behavioral Response: CasualAlertDepressed   Type of Therapy: Group Therapy   Treatment Goals addressed: Coping   Progress Towards Goals: Progressing   Interventions: CBT, DBT, Solution Focused, Strength-based, Supportive, and Reframing   Summary: Cln continued topic of DBT distress tolerance skills. Cln introduced Self-Soothe skills. Group discussed ways they can utilize the five senses to soothe themselves when struggling.   Therapist Response:  Pt engaged in discussion and identifies way to utilize the skill.        Session Time: 12:00 -1:00   Participation Level: Active   Behavioral Response: CasualAlertDepressed   Type of Therapy: Group therapy, Occupational Therapy   Treatment Goals addressed: Coping   Progress  Towards Goals: Progressing   Interventions: Supportive; Psychoeducation   Summary: 12:00 - 12:50: Occupational Therapy group led by cln E. Hollan. 12:50 - 1:00 Clinician assessed for immediate needs,  medication compliance and efficacy, and safety concerns.   Therapist Response: 12:00 - 12:50: See OT note 12:50 - 1:00 pm: At check-out, patient reports no immediate concerns. Patient demonstrates progress as evidenced by continued engagement and responsiveness to treatment. Patient denies SI/HI/self-harm thoughts at the end of group.    Suicidal/Homicidal: Nowithout intent/plan   Plan: Pt will continue in PHP while working to stabilize post-hospitalization, decrease depression and anxiety symptoms, and increase ability to manage symptoms in a healthy manner.   Collaboration of Care: Medication Management AEB J. McQuilla  Patient/Guardian was advised Release of Information must be obtained prior to any record release in order to collaborate their care with an outside provider. Patient/Guardian was advised if they have not already done so to contact the registration department to sign all necessary forms in order for [name] to release information regarding their care.   Consent: Patient/Guardian gives verbal consent for treatment and assignment of benefits for services provided during this visit. Patient/Guardian expressed understanding and agreed to proceed.   Diagnosis: MDD (major depressive disorder), recurrent severe, without psychosis (HCC) [F33.2]    1. MDD (major depressive disorder), recurrent severe, without psychosis (HCC)   2. Generalized anxiety disorder       Donia Guiles, LCSW

## 2023-01-26 NOTE — Psych (Signed)
Virtual Visit via Video Note  I connected with Emily Park on 01/19/23 at  9:00 AM EDT by a video enabled telemedicine application and verified that I am speaking with the correct person using two identifiers.  Location: Patient: patient home Provider: clinical home office   I discussed the limitations of evaluation and management by telemedicine and the availability of in person appointments. The patient expressed understanding and agreed to proceed.  I discussed the assessment and treatment plan with the patient. The patient was provided an opportunity to ask questions and all were answered. The patient agreed with the plan and demonstrated an understanding of the instructions.   The patient was advised to call back or seek an in-person evaluation if the symptoms worsen or if the condition fails to improve as anticipated.  Pt was provided 240 minutes of non-face-to-face time during this encounter.   Donia Guiles, LCSW   [facility] PHP THERAPIST PROGRESS NOTE  Emily Park [id number]  Session Time: 9:00 - 10:00  Participation Level: Active  Behavioral Response: CasualAlertDepressed  Type of Therapy: Group Therapy  Treatment Goals addressed: Coping  Progress Towards Goals: Progressing  Interventions: CBT, DBT, Supportive, and Reframing  Summary: Emily Park is a 52 y.o. female who presents with depression and anxiety symptoms.  Clinician led check-in regarding current stressors and situation, and review of patient completed daily inventory. Clinician utilized active listening and empathetic response and validated patient emotions. Clinician facilitated processing group on pertinent issues.?    Therapist Response: Patient arrived within time allowed. Patient rates her mood at a 8 on a scale of 1-10 with 10 being best. Pt states she feels "positive." Pt states she slept 8.5 hours and ate 2x. Pt reports she slept well last night and is feeling more rested. Pt reports  spending time outside and going on a walk with her dog. Pt reports struggling with boundaries. Patient able to process. Patient engaged in discussion.         Session Time: 10:00 am - 11:00 am   Participation Level: Active   Behavioral Response: CasualAlertDepressed   Type of Therapy: Group Therapy   Treatment Goals addressed: Coping   Progress Towards Goals: Progressing   Interventions: CBT, DBT, Solution Focused, Strength-based, Supportive, and Reframing   Therapist Response:  Cln led discussion on anxious behaviors. Group members shared patterns of anxiety they experience. Cln encouraged pt's to increase insight into the roots of their anxious behaviors to be able to address triggers.      Therapist Response: Pt engaged in discussion and reports increased insight into triggers.          Session Time: 11:00 -12:00   Participation Level: Active   Behavioral Response: CasualAlertDepressed   Type of Therapy: Group Therapy   Treatment Goals addressed: Coping   Progress Towards Goals: Progressing   Interventions: CBT, DBT, Solution Focused, Strength-based, Supportive, and Reframing   Summary: Cln led discussion on ways to manage stressors and feelings over the weekend. Group members  brainstormed things to do over the weekend for multiple levels of energy, access, and moods. Cln reviewed crisis services should they be needed and provided pt's with the text crisis line, mobile crisis, national suicide hotline, [facility] 24/7 line, and information on [facility].     Therapist Response:  Pt engaged in discussion and is able to identify 3 ideas of what to do over the weekend to keep their mind engaged.        Session  Time: 12:00 -1:00   Participation Level: Active   Behavioral Response: CasualAlertDepressed   Type of Therapy: Group therapy, Occupational Therapy   Treatment Goals addressed: Coping   Progress Towards Goals: Progressing   Interventions: Supportive;  Psychoeducation   Summary: 12:00 - 12:50: Occupational Therapy group led by cln E. Hollan. 12:50 - 1:00 Clinician assessed for immediate needs, medication compliance and efficacy, and safety concerns.   Therapist Response: 12:00 - 12:50: See OT note 12:50 - 1:00 pm: At check-out, patient reports no immediate concerns. Patient demonstrates progress as evidenced by continued engagement and responsiveness to treatment. Patient denies SI/HI/self-harm thoughts at the end of group.    Suicidal/Homicidal: Nowithout intent/plan   Plan: Pt will continue in PHP while working to stabilize post-hospitalization, decrease depression and anxiety symptoms, and increase ability to manage symptoms in a healthy manner.   Collaboration of Care: Medication Management AEB J. McQuilla  Patient/Guardian was advised Release of Information must be obtained prior to any record release in order to collaborate their care with an outside provider. Patient/Guardian was advised if they have not already done so to contact the registration department to sign all necessary forms in order for [name] to release information regarding their care.   Consent: Patient/Guardian gives verbal consent for treatment and assignment of benefits for services provided during this visit. Patient/Guardian expressed understanding and agreed to proceed.   Diagnosis: MDD (major depressive disorder), recurrent severe, without psychosis (HCC) [F33.2]    1. MDD (major depressive disorder), recurrent severe, without psychosis (HCC)   2. Generalized anxiety disorder       Donia Guiles, LCSW

## 2023-01-27 ENCOUNTER — Encounter (HOSPITAL_COMMUNITY): Payer: Self-pay | Admitting: Family

## 2023-01-27 ENCOUNTER — Encounter (HOSPITAL_COMMUNITY): Payer: Self-pay

## 2023-01-27 DIAGNOSIS — F411 Generalized anxiety disorder: Secondary | ICD-10-CM | POA: Insufficient documentation

## 2023-01-27 DIAGNOSIS — Z7722 Contact with and (suspected) exposure to environmental tobacco smoke (acute) (chronic): Secondary | ICD-10-CM | POA: Insufficient documentation

## 2023-01-27 DIAGNOSIS — G43909 Migraine, unspecified, not intractable, without status migrainosus: Secondary | ICD-10-CM | POA: Insufficient documentation

## 2023-01-27 DIAGNOSIS — Z818 Family history of other mental and behavioral disorders: Secondary | ICD-10-CM | POA: Insufficient documentation

## 2023-01-27 DIAGNOSIS — F332 Major depressive disorder, recurrent severe without psychotic features: Secondary | ICD-10-CM | POA: Insufficient documentation

## 2023-01-27 DIAGNOSIS — Z79899 Other long term (current) drug therapy: Secondary | ICD-10-CM | POA: Insufficient documentation

## 2023-01-27 NOTE — Psych (Signed)
Virtual Visit via Video Note  I connected with Emily Park on 01/24/23 at  9:00 AM EDT by a video enabled telemedicine application and verified that I am speaking with the correct person using two identifiers.  Location: Patient: patient home Provider: clinical home office   I discussed the limitations of evaluation and management by telemedicine and the availability of in person appointments. The patient expressed understanding and agreed to proceed.  I discussed the assessment and treatment plan with the patient. The patient was provided an opportunity to ask questions and all were answered. The patient agreed with the plan and demonstrated an understanding of the instructions.   The patient was advised to call back or seek an in-person evaluation if the symptoms worsen or if the condition fails to improve as anticipated.  Pt was provided 240 minutes of non-face-to-face time during this encounter.   Donia Guiles, LCSW   [facility] PHP THERAPIST PROGRESS NOTE  Emily Park [id number]  Session Time: 9:00 - 10:00  Participation Level: Active  Behavioral Response: CasualAlertDepressed  Type of Therapy: Group Therapy  Treatment Goals addressed: Coping  Progress Towards Goals: Progressing  Interventions: CBT, DBT, Supportive, and Reframing  Summary: Emily Park is a 52 y.o. female who presents with depression and anxiety symptoms.  Clinician led check-in regarding current stressors and situation, and review of patient completed daily inventory. Clinician utilized active listening and empathetic response and validated patient emotions. Clinician facilitated processing group on pertinent issues.?    Therapist Response: Patient arrived within time allowed. Patient rates her mood at a 5 on a scale of 1-10 with 10 being best. Pt states she feels "okay." Pt states she slept 8.5 hours and ate 2x. Pt reports she had a migraine yesterday that knocked her out and she was in bed  resting. Pt reports her weekend went well and she spent most of it with her husband. Pt struggles with maintaining mood.  Patient able to process. Patient engaged in discussion.         Session Time: 10:00 am - 11:00 am   Participation Level: Active   Behavioral Response: CasualAlertDepressed   Type of Therapy: Group Therapy   Treatment Goals addressed: Coping   Progress Towards Goals: Progressing   Interventions: CBT, DBT, Solution Focused, Strength-based, Supportive, and Reframing   Therapist Response: Cln led processing group for pt's current struggles. Group members shared stressors and provided support and feedback. Cln brought in topics of boundaries, healthy relationships, and unhealthy thought processes to inform discussion.    Therapist Response: Pt able to process and provide support to group.          Session Time: 11:00 -12:00   Participation Level: Active   Behavioral Response: CasualAlertDepressed   Type of Therapy: Group Therapy, Spiritual Care   Treatment Goals addressed: Coping   Progress Towards Goals: Progressing   Interventions: Supportive, Education   Summary:  [name], Chaplain, led group.   Therapist Response: Pt participated       Session Time: 12:00 -1:00   Participation Level: Active   Behavioral Response: CasualAlertDepressed   Type of Therapy: Group therapy, Occupational Therapy   Treatment Goals addressed: Coping   Progress Towards Goals: Progressing   Interventions: Supportive; Psychoeducation   Summary: 12:00 - 12:50: Occupational Therapy group led by cln E. Hollan. 12:50 - 1:00 Clinician assessed for immediate needs, medication compliance and efficacy, and safety concerns.   Therapist Response: 12:00 - 12:50: See OT note 12:50 - 1:00  pm: At check-out, patient reports no immediate concerns. Patient demonstrates progress as evidenced by continued engagement and responsiveness to treatment. Patient denies SI/HI/self-harm  thoughts at the end of group.    Suicidal/Homicidal: Nowithout intent/plan   Plan: Pt will continue in PHP while working to stabilize post-hospitalization, decrease depression and anxiety symptoms, and increase ability to manage symptoms in a healthy manner.   Collaboration of Care: Medication Management AEB J. McQuilla  Patient/Guardian was advised Release of Information must be obtained prior to any record release in order to collaborate their care with an outside provider. Patient/Guardian was advised if they have not already done so to contact the registration department to sign all necessary forms in order for [name] to release information regarding their care.   Consent: Patient/Guardian gives verbal consent for treatment and assignment of benefits for services provided during this visit. Patient/Guardian expressed understanding and agreed to proceed.   Diagnosis: MDD (major depressive disorder), recurrent severe, without psychosis (HCC) [F33.2]    1. MDD (major depressive disorder), recurrent severe, without psychosis (HCC)   2. Generalized anxiety disorder       Donia Guiles, LCSW

## 2023-01-27 NOTE — Therapy (Signed)
[facility] PARTIAL HOSPITALIZATION PROGRAM [address] [place], [place], [address] Phone: [phone]   Fax:  [phone]  Occupational Therapy Treatment Virtual Visit via Video Note  I connected with Emily Park on 01/27/23 at  8:00 AM EDT by a video enabled telemedicine application and verified that I am speaking with the correct person using two identifiers.  Location: Patient: home Provider: office   I discussed the limitations of evaluation and management by telemedicine and the availability of in person appointments. The patient expressed understanding and agreed to proceed.    The patient was advised to call back or seek an in-person evaluation if the symptoms worsen or if the condition fails to improve as anticipated.  I provided 55 minutes of non-face-to-face time during this encounter.   Patient Details  Name: Emily Park MRN: [id number] Date of Birth: 1971/08/28 No data recorded  Encounter Date: 01/26/2023   OT End of Session - 01/27/23 1221     Visit Number 6    Number of Visits 20    Date for OT Re-Evaluation 02/16/23    OT Start Time 1200    OT Stop Time 1255    OT Time Calculation (min) 55 min             Past Medical History:  Diagnosis Date   Abnormal TSH    ADD (attention deficit disorder)    Anemia    Anxiety    Arrhythmia    Decreased libido    Depression    Heart murmur    Hyperlipidemia    Hypokalemia    Migraine    Nephrolithiasis    Pap smear abnormality of cervix/human papillomavirus (HPV) positive    Sleep difficulties    SVT (supraventricular tachycardia)     Past Surgical History:  Procedure Laterality Date   ABLATION     Cardiac ablation, endometrial ablation   BREAST LUMPECTOMY     right breast   BREAST LUMPECTOMY Right    CARDIOVERSION     COLONOSCOPY WITH PROPOFOL N/A 05/16/2021   Procedure: COLONOSCOPY WITH PROPOFOL;  Surgeon: Charlott Rakes, MD;  Location: [facility];   Service: Endoscopy;  Laterality: N/A;   COLPOSCOPY     w/ cervical biopsy   DILITATION & CURRETTAGE/HYSTROSCOPY WITH NOVASURE ABLATION N/A 09/11/2014   Procedure: DILATATION & CURETTAGE/HYSTEROSCOPY WITH NOVASURE ABLATION;  Surgeon: Lenoard Aden, MD;  Location: [facility];  Service: Gynecology;  Laterality: N/A;   GIVENS CAPSULE STUDY N/A 05/16/2021   Procedure: GIVENS CAPSULE STUDY;  Surgeon: Charlott Rakes, MD;  Location: [facility];  Service: Endoscopy;  Laterality: N/A;   XI ROBOT ASSISTED RECTOPEXY N/A 07/07/2021   Procedure: XI ROBOT ASSISTED RECTOPEXY;  Surgeon: Karie Soda, MD;  Location: [facility];  Service: General;  Laterality: N/A;   XI ROBOTIC ASSISTED LOWER ANTERIOR RESECTION N/A 07/07/2021   Procedure: XI ROBOTIC ASSISTED LOW ANTERIOR RECTOSIGMOID RESECTION WITH RIGID PROCTOSCOPY, TAP BLOCK;  Surgeon: Karie Soda, MD;  Location: [facility];  Service: General;  Laterality: N/A;    There were no vitals filed for this visit.   Subjective Assessment - 01/27/23 1221     Currently in Pain? No/denies    Pain Score 0-No pain                 Group Session:  S: Feeling better today.   O: The objective of this presentation is to provide a comprehensive understanding of the concept of "motivation" and its role in  human behavior and well-being. The content covers various theories of motivation, including intrinsic and extrinsic motivators, and explores the psychological mechanisms that drive individuals to achieve goals, overcome obstacles, and make decisions. By diving into real-world applications, the presentation aims to offer actionable strategies for enhancing motivation in different life domains, such as work, relationships, and personal growth. Utilizing a multi-disciplinary approach, this presentation integrates insights from psychology, neuroscience, and behavioral economics to present a holistic view of motivation. The objective is not only to educate the audience about  the complexities and driving forces behind motivation but also to equip them with practical tools and techniques to improve their own motivation levels. By the end of the presentation, attendees should have a well-rounded understanding of what motivates human actions and how to harness this knowledge for personal and professional betterment.   A: The patient demonstrates a high level of engagement during the session, actively participating in discussions about motivation theories and their applicability to their own life. They show keen interest in learning new strategies to improve their motivation and even offer examples from their own experiences that align with the theories presented. Their level of self-awareness and willingness to invest in self-improvement suggest that they are well-positioned to benefit from the practical tools and techniques discussed. The patient's ability to articulate their goals and challenges further supports the likelihood of successfully implementing the strategies presented.     P: Continue to attend PHP OT group sessions 5x week for 4 weeks to promote daily structure, social engagement, and opportunities to develop and utilize adaptive strategies to maximize functional performance in preparation for safe transition and integration back into school, work, and the community. Plan to address topic of pt 2 in next OT group session.                  OT Education - 01/27/23 1221     Education Details Motivation 1              OT Short Term Goals - 01/17/23 1930       OT SHORT TERM GOAL #1   Title Client will develop and utilize a personalized coping toolbox containing at least five coping strategies to manage challenging situations, demonstrating their use in real-life scenarios by the end of therapy.    Time 4    Period Weeks    Status On-going    Target Date 02/16/23      OT SHORT TERM GOAL #2   Title Client will independently identify and  modify three areas of the current routine that contribute to increased stress or dysfunction by the end of therapy.    Time 4    Period Weeks    Status On-going    Target Date 02/16/23      OT SHORT TERM GOAL #3   Title By the time of discharge, client will independently set, track, and make progress towards a long-term goal, demonstrating resilience in overcoming obstacles and seeking support when needed.    Time 4    Period Weeks    Status On-going                      Plan - 01/27/23 1221     Psychosocial Skills Routines and Behaviors;Interpersonal Interaction;Habits;Coping Strategies             Patient will benefit from skilled therapeutic intervention in order to improve the following deficits and impairments:       Psychosocial Skills:  Routines and Behaviors, Interpersonal Interaction, Habits, Coping Strategies   Visit Diagnosis: Difficulty coping    Problem List Patient Active Problem List   Diagnosis Date Noted   MDD (major depressive disorder), recurrent severe, without psychosis (HCC) 12/28/2022   CKD stage 3a, GFR 45-59 ml/min (HCC) 12/25/2022   Chronic anemia 12/25/2022   Major depressive disorder, recurrent episode, severe (HCC) 12/25/2022   Generalized anxiety disorder 12/24/2022   Hypoglycemia 12/21/2022   Thyroid 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   Anxiety 02/24/2021   Heart murmur  02/24/2021   Menorrhagia 02/24/2021   Thyroid nodule 10/26/2020   Acute renal failure superimposed on stage 2 chronic kidney disease (HCC)    Hypotension    Hypovolemic shock (HCC) 08/29/2020   Rectal prolapse 04/05/2020   Migraine 02/27/2019   Mild episode of recurrent major depressive disorder (HCC) 10/25/2018   Abnormal thyroid stimulating hormone (TSH) level 10/24/2018   Syncope and collapse 10/10/2018   Lung nodules 11/14/2015   Paroxysmal SVT (supraventricular tachycardia) 11/13/2015   Attention deficit hyperactivity disorder (ADHD) 11/13/2015    Ted Mcalpine, OT 01/27/2023, 12:22 PM  Kerrin Champagne, OT   [facility] HOSPITALIZATION PROGRAM [address] [place], [place], [address] Phone: [phone]   Fax:  [phone]  Name: Emily Park MRN: [id number] Date of Birth: May 21, 1971

## 2023-01-28 NOTE — Psych (Signed)
Virtual Visit via Video Note  I connected with Emily Park on 01/25/23 at  9:00 AM EDT by a video enabled telemedicine application and verified that I am speaking with the correct person using two identifiers.  Location: Patient: patient home Provider: clinical home office   I discussed the limitations of evaluation and management by telemedicine and the availability of in person appointments. The patient expressed understanding and agreed to proceed.  I discussed the assessment and treatment plan with the patient. The patient was provided an opportunity to ask questions and all were answered. The patient agreed with the plan and demonstrated an understanding of the instructions.   The patient was advised to call back or seek an in-person evaluation if the symptoms worsen or if the condition fails to improve as anticipated.  Pt was provided 180 minutes of non-face-to-face time during this encounter.   Donia Guiles, LCSW   [facility] PHP THERAPIST PROGRESS NOTE  Emily Park [id number]  Session Time: 9:00 - 10:00  Participation Level: Active  Behavioral Response: CasualAlertDepressed  Type of Therapy: Group Therapy  Treatment Goals addressed: Coping  Progress Towards Goals: Progressing  Interventions: CBT, DBT, Supportive, and Reframing  Summary: Emily Park is a 52 y.o. female who presents with depression and anxiety symptoms.  Clinician led check-in regarding current stressors and situation, and review of patient completed daily inventory. Clinician utilized active listening and empathetic response and validated patient emotions. Clinician facilitated processing group on pertinent issues.?    Therapist Response: Patient arrived within time allowed. Patient rates her mood at a 6 on a scale of 1-10 with 10 being best. Pt states she feels "okay." Pt states she slept 7 hours and ate 2x. Pt reports her sleep was impaired by her dog. Pt reports she had a cardiologist appt  yesterday and that it went well and she will only need maintenance checkups now. Pt reports goal of walking 1 mile 4x/week. Pt struggles with making herself a priority. Patient able to process. Patient engaged in discussion.         Session Time: 10:00 am - 11:00 am   Participation Level: Active   Behavioral Response: CasualAlertDepressed   Type of Therapy: Group Therapy   Treatment Goals addressed: Coping   Progress Towards Goals: Progressing   Interventions: CBT, DBT, Solution Focused, Strength-based, Supportive, and Reframing   Therapist Response: Cln led discussion on ways to reinforce new habits. Group shared ways in which they can set themselves up for good habits. Cln encouraged reminders in phone, post-it notes, educating support system, and practicing in low-stress environments.    Therapist Response: Pt engaged in discussion and is able to determine ways to reinforce positive habits.          Session Time: 11:00 -12:00   Participation Level: Active   Behavioral Response: CasualAlertDepressed   Type of Therapy: Group Therapy   Treatment Goals addressed: Coping   Progress Towards Goals: Progressing   Interventions: CBT, DBT, Solution Focused, Strength-based, Supportive, and Reframing   Summary: Cln led discussion on feelings and the role they play for our lives. Cln provided DBT wise mind framework to discuss facts about feelings and how to utilize these facts to help contextualize the feelings which we experience.     Therapist Response:   Pt engaged in discussion and reports understanding.      ** Pt chose to leave group at 12 due to a conflicting appt. Pt denies SI before leaving group.  Suicidal/Homicidal: Nowithout intent/plan   Plan: Pt will continue in PHP while working to stabilize post-hospitalization, decrease depression and anxiety symptoms, and increase ability to manage symptoms in a healthy manner.   Collaboration of Care: Medication  Management A[employer] J[employer] McQuilla  Patient/Guardian was advised Release of Information must be obtained prior to any record release in order to collaborate their care with an outside provider. Patient/Guardian was advised if they have not already done so to contact the registration department to sign all necessary forms in order for [name] to release information regarding their care.   Consent: Patient/Guardian gives verbal consent for treatment and assignment of benefits for services provided during this visit. Patient/Guardian expressed understanding and agreed to proceed.   Diagnosis: MDD (major depressive disorder), recurrent severe, without psychosis (HCC) [F33.2]    1. MDD (major depressive disorder), recurrent severe, without psychosis (HCC)   2. Generalized anxiety disorder       Donia Guiles, LCSW

## 2023-01-28 NOTE — Psych (Signed)
Virtual Visit via Video Note  I connected with Emily Park on 01/26/23 at  9:00 AM EDT by a video enabled telemedicine application and verified that I am speaking with the correct person using two identifiers.  Location: Patient: patient home Provider: clinical home office   I discussed the limitations of evaluation and management by telemedicine and the availability of in person appointments. The patient expressed understanding and agreed to proceed.  I discussed the assessment and treatment plan with the patient. The patient was provided an opportunity to ask questions and all were answered. The patient agreed with the plan and demonstrated an understanding of the instructions.   The patient was advised to call back or seek an in-person evaluation if the symptoms worsen or if the condition fails to improve as anticipated.  Pt was provided 240 minutes of non-face-to-face time during this encounter.   Donia Guiles, LCSW   [facility] PHP THERAPIST PROGRESS NOTE  Emily Park [id number]  Session Time: 9:00 - 10:00  Participation Level: Active  Behavioral Response: CasualAlertDepressed  Type of Therapy: Group Therapy  Treatment Goals addressed: Coping  Progress Towards Goals: Progressing  Interventions: CBT, DBT, Supportive, and Reframing  Summary: Emily Park is a 52 y.o. female who presents with depression and anxiety symptoms.  Clinician led check-in regarding current stressors and situation, and review of patient completed daily inventory. Clinician utilized active listening and empathetic response and validated patient emotions. Clinician facilitated processing group on pertinent issues.?    Therapist Response: Patient arrived within time allowed. Patient rates her mood at a 6 on a scale of 1-10 with 10 being best. Pt states she feels "pretty good." Pt states she slept 9 hours and ate 2x. Pt reports her afternoon went well and she did a chore, spent time outside,  and took a walk. Pt reports feeling that she is managing her time well and still feels depressed which is upsetting. Cln educated pt on depression and its causes. Patient able to process. Patient engaged in discussion.         Session Time: 10:00 am - 11:00 am   Participation Level: Active   Behavioral Response: CasualAlertDepressed   Type of Therapy: Group Therapy   Treatment Goals addressed: Coping   Progress Towards Goals: Progressing   Interventions: CBT, DBT, Solution Focused, Strength-based, Supportive, and Reframing   Therapist Response: Cln led discussion on trust and how to establish healthy trust. Group discussed common missteps such as disclosing personal information too soon, ignoring behaviors being displayed, inaccurately defining the relationship, and not giving people credit. Cln utilized CBT thought challenging principles to encourage pt's to rely on "evidence" versus feelings. Group members shared struggles they experience with trust.   Therapist Response: Pt engaged in discussion and is able to identify  areas of improvement in their trust process.          Session Time: 11:00 -12:00   Participation Level: Active   Behavioral Response: CasualAlertDepressed   Type of Therapy: Group Therapy   Treatment Goals addressed: Coping   Progress Towards Goals: Progressing   Interventions: CBT, DBT, Solution Focused, Strength-based, Supportive, and Reframing   Summary: Cln led discussion on control and the way it impacts our lives. Group members shared struggles and worked to identify the way in which control is contributing to the struggle. Cln utilized CBT thought challenging and the Catch-Challenge-Change model to address their control issues.    Therapist Response:  Pt engaged in discussion and is able  to determine ways in which control is an issue for them and brainstormed how to address it in a healthy manner.         Session Time: 12:00 -1:00    Participation Level: Active   Behavioral Response: CasualAlertDepressed   Type of Therapy: Group therapy, Occupational Therapy   Treatment Goals addressed: Coping   Progress Towards Goals: Progressing   Interventions: Supportive; Psychoeducation   Summary: 12:00 - 12:50: Occupational Therapy group led by cln E. Hollan. 12:50 - 1:00 Clinician assessed for immediate needs, medication compliance and efficacy, and safety concerns.   Therapist Response: 12:00 - 12:50: See OT note 12:50 - 1:00 pm: At check-out, patient reports no immediate concerns. Patient demonstrates progress as evidenced by continued engagement and responsiveness to treatment. Patient denies SI/HI/self-harm thoughts at the end of group.    Suicidal/Homicidal: Nowithout intent/plan   Plan: Pt will continue in PHP while working to stabilize post-hospitalization, decrease depression and anxiety symptoms, and increase ability to manage symptoms in a healthy manner.   Collaboration of Care: Medication Management AEB J. McQuilla  Patient/Guardian was advised Release of Information must be obtained prior to any record release in order to collaborate their care with an outside provider. Patient/Guardian was advised if they have not already done so to contact the registration department to sign all necessary forms in order for [name] to release information regarding their care.   Consent: Patient/Guardian gives verbal consent for treatment and assignment of benefits for services provided during this visit. Patient/Guardian expressed understanding and agreed to proceed.   Diagnosis: MDD (major depressive disorder), recurrent severe, without psychosis (HCC) [F33.2]    1. MDD (major depressive disorder), recurrent severe, without psychosis (HCC)   2. Generalized anxiety disorder       Donia Guiles, LCSW

## 2023-01-29 ENCOUNTER — Other Ambulatory Visit (HOSPITAL_COMMUNITY): Payer: Commercial Managed Care - PPO | Attending: Psychiatry | Admitting: Licensed Clinical Social Worker

## 2023-01-29 ENCOUNTER — Other Ambulatory Visit (HOSPITAL_COMMUNITY): Payer: Commercial Managed Care - PPO

## 2023-01-29 ENCOUNTER — Encounter (HOSPITAL_COMMUNITY): Payer: Self-pay

## 2023-01-29 DIAGNOSIS — G43909 Migraine, unspecified, not intractable, without status migrainosus: Secondary | ICD-10-CM | POA: Diagnosis not present

## 2023-01-29 DIAGNOSIS — Z818 Family history of other mental and behavioral disorders: Secondary | ICD-10-CM | POA: Diagnosis not present

## 2023-01-29 DIAGNOSIS — F332 Major depressive disorder, recurrent severe without psychotic features: Secondary | ICD-10-CM

## 2023-01-29 DIAGNOSIS — F411 Generalized anxiety disorder: Secondary | ICD-10-CM | POA: Diagnosis not present

## 2023-01-29 DIAGNOSIS — Z79899 Other long term (current) drug therapy: Secondary | ICD-10-CM | POA: Diagnosis not present

## 2023-01-29 DIAGNOSIS — R4589 Other symptoms and signs involving emotional state: Secondary | ICD-10-CM

## 2023-01-29 DIAGNOSIS — Z7722 Contact with and (suspected) exposure to environmental tobacco smoke (acute) (chronic): Secondary | ICD-10-CM | POA: Diagnosis not present

## 2023-01-29 NOTE — Therapy (Signed)
[facility] PARTIAL HOSPITALIZATION PROGRAM [address] [place], [place], [address] Phone: [phone]   Fax:  [phone]  Occupational Therapy Treatment Virtual Visit via Video Note  I connected with Phallon Cassie Arnesen on 01/29/23 at  8:00 AM EDT by a video enabled telemedicine application and verified that I am speaking with the correct person using two identifiers.  Location: Patient: home Provider: office   I discussed the limitations of evaluation and management by telemedicine and the availability of in person appointments. The patient expressed understanding and agreed to proceed.    The patient was advised to call back or seek an in-person evaluation if the symptoms worsen or if the condition fails to improve as anticipated.  I provided 55 minutes of non-face-to-face time during this encounter.   Patient Details  Name: Emily Park MRN: [id number] Date of Birth: 10/29/1970 No data recorded  Encounter Date: 01/29/2023   OT End of Session - 01/29/23 1706     Visit Number 7    Number of Visits 20    Date for OT Re-Evaluation 02/16/23    OT Start Time 1200    OT Stop Time 1255    OT Time Calculation (min) 55 min             Past Medical History:  Diagnosis Date   Abnormal TSH    ADD (attention deficit disorder)    Anemia    Anxiety    Arrhythmia    Decreased libido    Depression    Heart murmur    Hyperlipidemia    Hypokalemia    Migraine    Nephrolithiasis    Pap smear abnormality of cervix/human papillomavirus (HPV) positive    Sleep difficulties    SVT (supraventricular tachycardia)     Past Surgical History:  Procedure Laterality Date   ABLATION     Cardiac ablation, endometrial ablation   BREAST LUMPECTOMY     right breast   BREAST LUMPECTOMY Right    CARDIOVERSION     COLONOSCOPY WITH PROPOFOL N/A 05/16/2021   Procedure: COLONOSCOPY WITH PROPOFOL;  Surgeon: Charlott Rakes, MD;  Location: [facility];   Service: Endoscopy;  Laterality: N/A;   COLPOSCOPY     w/ cervical biopsy   DILITATION & CURRETTAGE/HYSTROSCOPY WITH NOVASURE ABLATION N/A 09/11/2014   Procedure: DILATATION & CURETTAGE/HYSTEROSCOPY WITH NOVASURE ABLATION;  Surgeon: Lenoard Aden, MD;  Location: [facility];  Service: Gynecology;  Laterality: N/A;   GIVENS CAPSULE STUDY N/A 05/16/2021   Procedure: GIVENS CAPSULE STUDY;  Surgeon: Charlott Rakes, MD;  Location: [facility];  Service: Endoscopy;  Laterality: N/A;   XI ROBOT ASSISTED RECTOPEXY N/A 07/07/2021   Procedure: XI ROBOT ASSISTED RECTOPEXY;  Surgeon: Karie Soda, MD;  Location: [facility];  Service: General;  Laterality: N/A;   XI ROBOTIC ASSISTED LOWER ANTERIOR RESECTION N/A 07/07/2021   Procedure: XI ROBOTIC ASSISTED LOW ANTERIOR RECTOSIGMOID RESECTION WITH RIGID PROCTOSCOPY, TAP BLOCK;  Surgeon: Karie Soda, MD;  Location: [facility];  Service: General;  Laterality: N/A;    There were no vitals filed for this visit.   Subjective Assessment - 01/29/23 1705     Currently in Pain? No/denies    Pain Score 0-No pain                 Group Session:  S: Had a pretty good weekend. Feeling better today.   O: The objective of this presentation is to provide a comprehensive understanding of the concept of "  motivation" and its role in human behavior and well-being. The content covers various theories of motivation, including intrinsic and extrinsic motivators, and explores the psychological mechanisms that drive individuals to achieve goals, overcome obstacles, and make decisions. By diving into real-world applications, the presentation aims to offer actionable strategies for enhancing motivation in different life domains, such as work, relationships, and personal growth. Utilizing a multi-disciplinary approach, this presentation integrates insights from psychology, neuroscience, and behavioral economics to present a holistic view of motivation. The objective is not only to  educate the audience about the complexities and driving forces behind motivation but also to equip them with practical tools and techniques to improve their own motivation levels. By the end of the presentation, attendees should have a well-rounded understanding of what motivates human actions and how to harness this knowledge for personal and professional betterment.   A:  The patient demonstrates a high level of engagement during the session, actively participating in discussions about motivation theories and their applicability to their own life. They show keen interest in learning new strategies to improve their motivation and even offer examples from their own experiences that align with the theories presented. Their level of self-awareness and willingness to invest in self-improvement suggest that they are well-positioned to benefit from the practical tools and techniques discussed. The patient's ability to articulate their goals and challenges further supports the likelihood of successfully implementing the strategies presented.    P: Continue to attend PHP OT group sessions 5x week for 4 weeks to promote daily structure, social engagement, and opportunities to develop and utilize adaptive strategies to maximize functional performance in preparation for safe transition and integration back into school, work, and the community. Plan to address topic of pt 3 in next OT group session.                  OT Education - 01/29/23 1705     Education Details Motivation 2              OT Short Term Goals - 01/17/23 1930       OT SHORT TERM GOAL #1   Title Client will develop and utilize a personalized coping toolbox containing at least five coping strategies to manage challenging situations, demonstrating their use in real-life scenarios by the end of therapy.    Time 4    Period Weeks    Status On-going    Target Date 02/16/23      OT SHORT TERM GOAL #2   Title Client will  independently identify and modify three areas of the current routine that contribute to increased stress or dysfunction by the end of therapy.    Time 4    Period Weeks    Status On-going    Target Date 02/16/23      OT SHORT TERM GOAL #3   Title By the time of discharge, client will independently set, track, and make progress towards a long-term goal, demonstrating resilience in overcoming obstacles and seeking support when needed.    Time 4    Period Weeks    Status On-going                      Plan - 01/29/23 1706     Psychosocial Skills Routines and Behaviors;Interpersonal Interaction;Habits;Coping Strategies             Patient will benefit from skilled therapeutic intervention in order to improve the following deficits and impairments:  Psychosocial Skills: Routines and Behaviors, Interpersonal Interaction, Habits, Coping Strategies   Visit Diagnosis: Difficulty coping    Problem List Patient Active Problem List   Diagnosis Date Noted   MDD (major depressive disorder), recurrent severe, without psychosis (HCC) 12/28/2022   CKD stage 3a, GFR 45-59 ml/min (HCC) 12/25/2022   Chronic anemia 12/25/2022   Major depressive disorder, recurrent episode, severe (HCC) 12/25/2022   Generalized anxiety disorder 12/24/2022   Hypoglycemia 12/21/2022   Thyroid 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   Anxiety  02/24/2021   Heart murmur 02/24/2021   Menorrhagia 02/24/2021   Thyroid nodule 10/26/2020   Acute renal failure superimposed on stage 2 chronic kidney disease (HCC)    Hypotension    Hypovolemic shock (HCC) 08/29/2020   Rectal prolapse 04/05/2020   Migraine 02/27/2019   Mild episode of recurrent major depressive disorder (HCC) 10/25/2018   Abnormal thyroid stimulating hormone (TSH) level 10/24/2018   Syncope and collapse 10/10/2018   Lung nodules 11/14/2015   Paroxysmal SVT (supraventricular tachycardia) 11/13/2015   Attention deficit hyperactivity disorder (ADHD) 11/13/2015    Ted Mcalpine, OT 01/29/2023, 5:07 PM  Kerrin Champagne, OT   [facility] HOSPITALIZATION PROGRAM [address] [place], [place], [address] Phone: [phone]   Fax:  [phone]  Name: Emily Park MRN: [id number] Date of Birth: [birth year]

## 2023-01-29 NOTE — Progress Notes (Signed)
Spoke with patient via Teams video call, used 2 identifiers to correctly identify patient. States that groups are going well. She did not sleep well last night but she worked hard at her mothers house over the weekend. She is trying to wean off Klonopin and use Hydroxyzine instead. She is hoping to do IOP after PHP ends. On scale 1-10 as 10 being worst she rates depression at 3 and anxiety at 7. Denies SI/HI or AV hallucinations. No issues or complaints.

## 2023-01-30 ENCOUNTER — Other Ambulatory Visit (HOSPITAL_COMMUNITY): Payer: Commercial Managed Care - PPO

## 2023-01-31 ENCOUNTER — Other Ambulatory Visit (HOSPITAL_COMMUNITY): Payer: Commercial Managed Care - PPO

## 2023-01-31 ENCOUNTER — Encounter: Payer: Self-pay | Admitting: Internal Medicine

## 2023-01-31 ENCOUNTER — Other Ambulatory Visit (HOSPITAL_COMMUNITY): Payer: Commercial Managed Care - PPO | Admitting: Licensed Clinical Social Worker

## 2023-01-31 DIAGNOSIS — F332 Major depressive disorder, recurrent severe without psychotic features: Secondary | ICD-10-CM | POA: Diagnosis not present

## 2023-01-31 DIAGNOSIS — R4589 Other symptoms and signs involving emotional state: Secondary | ICD-10-CM

## 2023-01-31 DIAGNOSIS — F411 Generalized anxiety disorder: Secondary | ICD-10-CM

## 2023-02-01 ENCOUNTER — Other Ambulatory Visit (HOSPITAL_COMMUNITY): Payer: Commercial Managed Care - PPO

## 2023-02-01 ENCOUNTER — Encounter (HOSPITAL_COMMUNITY): Payer: Self-pay

## 2023-02-01 NOTE — Therapy (Signed)
[facility] PARTIAL HOSPITALIZATION PROGRAM [address] [place], [place], [address] Phone: [phone]   Fax:  [phone]  Occupational Therapy Treatment Virtual Visit via Video Note  I connected with Emily Park on 02/01/23 at  8:00 AM EDT by a video enabled telemedicine application and verified that I am speaking with the correct person using two identifiers.  Location: Patient: home Provider: office   I discussed the limitations of evaluation and management by telemedicine and the availability of in person appointments. The patient expressed understanding and agreed to proceed.    The patient was advised to call back or seek an in-person evaluation if the symptoms worsen or if the condition fails to improve as anticipated.  I provided 55 minutes of non-face-to-face time during this encounter.   Patient Details  Name: Emily Park MRN: [id number] Date of Birth: 1970-09-12 No data recorded  Encounter Date: 01/31/2023   OT End of Session - 02/01/23 2345     Visit Number 8    Number of Visits 20    Date for OT Re-Evaluation 02/16/23    OT Start Time 1200    OT Stop Time 1255    OT Time Calculation (min) 55 min             Past Medical History:  Diagnosis Date   Abnormal TSH    ADD (attention deficit disorder)    Anemia    Anxiety    Arrhythmia    Decreased libido    Depression    Heart murmur    Hyperlipidemia    Hypokalemia    Migraine    Nephrolithiasis    Pap smear abnormality of cervix/human papillomavirus (HPV) positive    Sleep difficulties    SVT (supraventricular tachycardia)     Past Surgical History:  Procedure Laterality Date   ABLATION     Cardiac ablation, endometrial ablation   BREAST LUMPECTOMY     right breast   BREAST LUMPECTOMY Right    CARDIOVERSION     COLONOSCOPY WITH PROPOFOL N/A 05/16/2021   Procedure: COLONOSCOPY WITH PROPOFOL;  Surgeon: Charlott Rakes, MD;  Location: [facility];   Service: Endoscopy;  Laterality: N/A;   COLPOSCOPY     w/ cervical biopsy   DILITATION & CURRETTAGE/HYSTROSCOPY WITH NOVASURE ABLATION N/A 09/11/2014   Procedure: DILATATION & CURETTAGE/HYSTEROSCOPY WITH NOVASURE ABLATION;  Surgeon: Lenoard Aden, MD;  Location: [facility];  Service: Gynecology;  Laterality: N/A;   GIVENS CAPSULE STUDY N/A 05/16/2021   Procedure: GIVENS CAPSULE STUDY;  Surgeon: Charlott Rakes, MD;  Location: [facility];  Service: Endoscopy;  Laterality: N/A;   XI ROBOT ASSISTED RECTOPEXY N/A 07/07/2021   Procedure: XI ROBOT ASSISTED RECTOPEXY;  Surgeon: Karie Soda, MD;  Location: [facility];  Service: General;  Laterality: N/A;   XI ROBOTIC ASSISTED LOWER ANTERIOR RESECTION N/A 07/07/2021   Procedure: XI ROBOTIC ASSISTED LOW ANTERIOR RECTOSIGMOID RESECTION WITH RIGID PROCTOSCOPY, TAP BLOCK;  Surgeon: Karie Soda, MD;  Location: [facility];  Service: General;  Laterality: N/A;    There were no vitals filed for this visit.   Subjective Assessment - 02/01/23 2343     Currently in Pain? No/denies    Pain Score 0-No pain                 Group Session:  S: Feeling better today overall.   O: The primary objective of this topic is to explore and understand the concept of occupational balance in the context  of daily living. The term "occupational balance" is defined broadly, encompassing all activities that occupy an individual's time and energy, including self-care, leisure, and work-related tasks. The goal is to guide participants towards achieving a harmonious blend of these activities, tailored to their personal values and life circumstances. This balance is aimed at enhancing overall well-being, not by equally distributing time across activities, but by ensuring that daily engagements are fulfilling and not draining. The content delves into identifying various barriers that individuals face in achieving occupational balance, such as overcommitment, misaligned  priorities, external pressures, and lack of effective time management. The impact of these barriers on occupational performance, roles, and lifestyles is examined, highlighting issues like reduced efficiency, strained relationships, and potential health problems. Strategies for cultivating occupational balance are a key focus. These strategies include practical methods like time blocking, prioritizing tasks, establishing self-care rituals, decluttering, connecting with nature, and engaging in reflective practices. These approaches are designed to be adaptable and applicable to a wide range of life scenarios, promoting a proactive and mindful approach to daily living. The overall aim is to equip participants with the knowledge and tools to create a balanced lifestyle that supports their mental, emotional, and physical health, thereby improving their functional performance in daily life.   A:  The patient demonstrated a high level of engagement and active participation throughout the session on occupational balance. The patient frequently contributed to discussions, offering insightful reflections on personal experiences related to the barriers and strategies for achieving occupational balance. There was a clear understanding of the concept and an ability to relate it to their own life. The patient showed enthusiasm in learning and applying the strategies discussed, such as time blocking and self-care rituals, indicating a strong motivation to improve their occupational balance. The patient's proactive approach and responsiveness to the topic suggest a high potential for implementing these strategies effectively in their daily routine.   P: Continue to attend PHP OT group sessions 5x week for 4 weeks to promote daily structure, social engagement, and opportunities to develop and utilize adaptive strategies to maximize functional performance in preparation for safe transition and integration back into school,  work, and the community. Plan to address topic of OB cont'd in next OT group session.                  OT Education - 02/01/23 2343     Education Details Occupational Balance              OT Short Term Goals - 01/17/23 1930       OT SHORT TERM GOAL #1   Title Client will develop and utilize a personalized coping toolbox containing at least five coping strategies to manage challenging situations, demonstrating their use in real-life scenarios by the end of therapy.    Time 4    Period Weeks    Status On-going    Target Date 02/16/23      OT SHORT TERM GOAL #2   Title Client will independently identify and modify three areas of the current routine that contribute to increased stress or dysfunction by the end of therapy.    Time 4    Period Weeks    Status On-going    Target Date 02/16/23      OT SHORT TERM GOAL #3   Title By the time of discharge, client will independently set, track, and make progress towards a long-term goal, demonstrating resilience in overcoming obstacles and seeking support when needed.  Time 4    Period Weeks    Status On-going                      Plan - 02/01/23 2345     Psychosocial Skills Routines and Behaviors;Interpersonal Interaction;Habits;Coping Strategies             Patient will benefit from skilled therapeutic intervention in order to improve the following deficits and impairments:       Psychosocial Skills: Routines and Behaviors, Interpersonal Interaction, Habits, Coping Strategies   Visit Diagnosis: Difficulty coping    Problem List Patient Active Problem List   Diagnosis Date Noted   MDD (major depressive disorder), recurrent severe, without psychosis (HCC) 12/28/2022   CKD stage 3a, GFR 45-59 ml/min (HCC) 12/25/2022   Chronic anemia 12/25/2022   Major depressive disorder, recurrent episode, severe (HCC) 12/25/2022   Generalized anxiety disorder 12/24/2022   Hypoglycemia 12/21/2022    Thyroid 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   Anxiety 02/24/2021   Heart murmur 02/24/2021   Menorrhagia 02/24/2021   Thyroid nodule 10/26/2020   Acute renal failure superimposed on stage 2 chronic kidney disease (HCC)    Hypotension    Hypovolemic shock (HCC) 08/29/2020   Rectal prolapse 04/05/2020   Migraine 02/27/2019   Mild episode of recurrent major depressive disorder (HCC) 10/25/2018   Abnormal thyroid stimulating hormone (TSH) level 10/24/2018   Syncope and collapse 10/10/2018   Lung nodules 11/14/2015   Paroxysmal SVT (supraventricular tachycardia) 11/13/2015   Attention deficit hyperactivity disorder (ADHD) 11/13/2015    Ted Mcalpine, OT 02/01/2023, 11:47 PM Kerrin Champagne, OT  [facility] HOSPITALIZATION PROGRAM [address] [place], [place], [address] Phone: [phone]   Fax:  [phone]  Name: Emily Park MRN: [id number] Date of Birth: 1970-11-25

## 2023-02-02 ENCOUNTER — Other Ambulatory Visit (HOSPITAL_COMMUNITY): Payer: Commercial Managed Care - PPO | Admitting: Licensed Clinical Social Worker

## 2023-02-02 ENCOUNTER — Other Ambulatory Visit (HOSPITAL_COMMUNITY): Payer: Commercial Managed Care - PPO

## 2023-02-02 DIAGNOSIS — R4589 Other symptoms and signs involving emotional state: Secondary | ICD-10-CM

## 2023-02-02 DIAGNOSIS — F332 Major depressive disorder, recurrent severe without psychotic features: Secondary | ICD-10-CM

## 2023-02-02 DIAGNOSIS — F411 Generalized anxiety disorder: Secondary | ICD-10-CM

## 2023-02-05 ENCOUNTER — Other Ambulatory Visit (HOSPITAL_COMMUNITY): Payer: Commercial Managed Care - PPO

## 2023-02-05 ENCOUNTER — Encounter (HOSPITAL_COMMUNITY): Payer: Self-pay

## 2023-02-05 ENCOUNTER — Other Ambulatory Visit (HOSPITAL_COMMUNITY): Payer: Commercial Managed Care - PPO | Admitting: Licensed Clinical Social Worker

## 2023-02-05 ENCOUNTER — Telehealth (HOSPITAL_COMMUNITY): Payer: Self-pay | Admitting: Psychiatry

## 2023-02-05 DIAGNOSIS — R4589 Other symptoms and signs involving emotional state: Secondary | ICD-10-CM

## 2023-02-05 DIAGNOSIS — F411 Generalized anxiety disorder: Secondary | ICD-10-CM

## 2023-02-05 DIAGNOSIS — G43909 Migraine, unspecified, not intractable, without status migrainosus: Secondary | ICD-10-CM

## 2023-02-05 DIAGNOSIS — F332 Major depressive disorder, recurrent severe without psychotic features: Secondary | ICD-10-CM | POA: Diagnosis not present

## 2023-02-05 MED ORDER — TOPIRAMATE 100 MG PO TABS
100.0000 mg | ORAL_TABLET | Freq: Every day | ORAL | 0 refills | Status: DC
Start: 2023-02-05 — End: 2023-03-16

## 2023-02-05 MED ORDER — VORTIOXETINE HBR 20 MG PO TABS
20.0000 mg | ORAL_TABLET | Freq: Every day | ORAL | 0 refills | Status: DC
Start: 2023-02-05 — End: 2023-03-06

## 2023-02-05 NOTE — Therapy (Signed)
[facility] PARTIAL HOSPITALIZATION PROGRAM [address] [place], [place], [address] Phone: [phone]   Fax:  [phone]  Occupational Therapy Treatment Virtual Visit via Video Note  I connected with [name] on 02/05/23 at  8:00 AM EDT by a video enabled telemedicine application and verified that I am speaking with the correct person using two identifiers.  Location: Patient: home Provider: office   I discussed the limitations of evaluation and management by telemedicine and the availability of in person appointments. The patient expressed understanding and agreed to proceed.    The patient was advised to call back or seek an in-person evaluation if the symptoms worsen or if the condition fails to improve as anticipated.  I provided 55 minutes of non-face-to-face time during this encounter.   Patient Details  Name: Emily Park MRN: [id number] Date of Birth: [birth year] No data recorded  Encounter Date: 02/02/2023   OT End of Session - 02/05/23 1050     Visit Number 9    Number of Visits 20    Date for OT Re-Evaluation 02/16/23    OT Start Time 1200    OT Stop Time 1255    OT Time Calculation (min) 55 min             Past Medical History:  Diagnosis Date   Abnormal TSH    ADD (attention deficit disorder)    Anemia    Anxiety    Arrhythmia    Decreased libido    Depression    Heart murmur    Hyperlipidemia    Hypokalemia    Migraine    Nephrolithiasis    Pap smear abnormality of cervix/human papillomavirus (HPV) positive    Sleep difficulties    SVT (supraventricular tachycardia)     Past Surgical History:  Procedure Laterality Date   ABLATION     Cardiac ablation, endometrial ablation   BREAST LUMPECTOMY     right breast   BREAST LUMPECTOMY Right    CARDIOVERSION     COLONOSCOPY WITH PROPOFOL N/A 05/16/2021   Procedure: COLONOSCOPY WITH PROPOFOL;  Surgeon: Charlott Rakes, MD;  Location: [facility];   Service: Endoscopy;  Laterality: N/A;   COLPOSCOPY     w/ cervical biopsy   DILITATION & CURRETTAGE/HYSTROSCOPY WITH NOVASURE ABLATION N/A 09/11/2014   Procedure: DILATATION & CURETTAGE/HYSTEROSCOPY WITH NOVASURE ABLATION;  Surgeon: Lenoard Aden, MD;  Location: [facility];  Service: Gynecology;  Laterality: N/A;   GIVENS CAPSULE STUDY N/A 05/16/2021   Procedure: GIVENS CAPSULE STUDY;  Surgeon: Charlott Rakes, MD;  Location: [facility];  Service: Endoscopy;  Laterality: N/A;   XI ROBOT ASSISTED RECTOPEXY N/A 07/07/2021   Procedure: XI ROBOT ASSISTED RECTOPEXY;  Surgeon: Karie Soda, MD;  Location: [facility];  Service: General;  Laterality: N/A;   XI ROBOTIC ASSISTED LOWER ANTERIOR RESECTION N/A 07/07/2021   Procedure: XI ROBOTIC ASSISTED LOW ANTERIOR RECTOSIGMOID RESECTION WITH RIGID PROCTOSCOPY, TAP BLOCK;  Surgeon: Karie Soda, MD;  Location: [facility];  Service: General;  Laterality: N/A;    There were no vitals filed for this visit.   Subjective Assessment - 02/05/23 1049     Currently in Pain? No/denies    Pain Score 0-No pain                Group Session:  S: Feeling better today.   O: The primary objective of this topic is to explore and understand the concept of occupational balance in the context of daily  living. The term "occupational balance" is defined broadly, encompassing all activities that occupy an individual's time and energy, including self-care, leisure, and work-related tasks. The goal is to guide participants towards achieving a harmonious blend of these activities, tailored to their personal values and life circumstances. This balance is aimed at enhancing overall well-being, not by equally distributing time across activities, but by ensuring that daily engagements are fulfilling and not draining. The content delves into identifying various barriers that individuals face in achieving occupational balance, such as overcommitment, misaligned priorities, external  pressures, and lack of effective time management. The impact of these barriers on occupational performance, roles, and lifestyles is examined, highlighting issues like reduced efficiency, strained relationships, and potential health problems. Strategies for cultivating occupational balance are a key focus. These strategies include practical methods like time blocking, prioritizing tasks, establishing self-care rituals, decluttering, connecting with nature, and engaging in reflective practices. These approaches are designed to be adaptable and applicable to a wide range of life scenarios, promoting a proactive and mindful approach to daily living. The overall aim is to equip participants with the knowledge and tools to create a balanced lifestyle that supports their mental, emotional, and physical health, thereby improving their functional performance in daily life.   A:  The patient demonstrated a high level of engagement and active participation throughout the session on occupational balance. The patient frequently contributed to discussions, offering insightful reflections on personal experiences related to the barriers and strategies for achieving occupational balance. There was a clear understanding of the concept and an ability to relate it to their own life. The patient showed enthusiasm in learning and applying the strategies discussed, such as time blocking and self-care rituals, indicating a strong motivation to improve their occupational balance. The patient's proactive approach and responsiveness to the topic suggest a high potential for implementing these strategies effectively in their daily routine.   P: Continue to attend PHP OT group sessions 5x week for 4 weeks to promote daily structure, social engagement, and opportunities to develop and utilize adaptive strategies to maximize functional performance in preparation for safe transition and integration back into school, work, and the  community. Plan to address topic of tbd in next OT group session.                   OT Education - 02/05/23 1050     Education Details Occupational Balance              OT Short Term Goals - 01/17/23 1930       OT SHORT TERM GOAL #1   Title Client will develop and utilize a personalized coping toolbox containing at least five coping strategies to manage challenging situations, demonstrating their use in real-life scenarios by the end of therapy.    Time 4    Period Weeks    Status On-going    Target Date 02/16/23      OT SHORT TERM GOAL #2   Title Client will independently identify and modify three areas of the current routine that contribute to increased stress or dysfunction by the end of therapy.    Time 4    Period Weeks    Status On-going    Target Date 02/16/23      OT SHORT TERM GOAL #3   Title By the time of discharge, client will independently set, track, and make progress towards a long-term goal, demonstrating resilience in overcoming obstacles and seeking support when needed.    Time  4    Period Weeks    Status On-going                      Plan - 02/05/23 1050     Psychosocial Skills Routines and Behaviors;Interpersonal Interaction;Habits;Coping Strategies             Patient will benefit from skilled therapeutic intervention in order to improve the following deficits and impairments:       Psychosocial Skills: Routines and Behaviors, Interpersonal Interaction, Habits, Coping Strategies   Visit Diagnosis: Difficulty coping    Problem List Patient Active Problem List   Diagnosis Date Noted   MDD (major depressive disorder), recurrent severe, without psychosis (HCC) 12/28/2022   CKD stage 3a, GFR 45-59 ml/min (HCC) 12/25/2022   Chronic anemia 12/25/2022   Major depressive disorder, recurrent episode, severe (HCC) 12/25/2022   Generalized anxiety disorder 12/24/2022   Hypoglycemia 12/21/2022   Thyroid function  test abnormal 10/04/2022   Dry mouth 10/03/2022   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   IBS (irritable bowel syndrome) 10/03/2022   Acute encephalopathy 06/06/2022   High anion gap metabolic acidosis 06/06/2022   Complete rectal prolapse 07/07/2021   Anemia, iron deficiency 05/13/2021   Hypokalemia 05/13/2021   Diarrhea 04/14/2021   Intractable vomiting with nausea 04/14/2021   UTI (urinary tract infection) 04/14/2021   Sepsis (HCC) 04/13/2021   Atypical squamous cells of undetermined significance on cytologic smear of cervix (ASC-US) 02/24/2021   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   Anxiety 02/24/2021   Heart murmur 02/24/2021   Menorrhagia 02/24/2021   Thyroid nodule 10/26/2020   Acute renal failure superimposed on stage 2 chronic kidney disease (HCC)    Hypotension    Hypovolemic shock (HCC) 08/29/2020   Rectal prolapse 04/05/2020   Migraine 02/27/2019   Mild episode of recurrent major depressive disorder (HCC) 10/25/2018   Abnormal thyroid stimulating hormone (TSH) level 10/24/2018   Syncope and collapse 10/10/2018   Lung nodules 11/14/2015   Paroxysmal SVT (supraventricular tachycardia) 11/13/2015   Attention deficit hyperactivity disorder (ADHD) 11/13/2015    Ted Mcalpine, OT 02/05/2023, 10:50 AM  Kerrin Champagne, OT   [facility] HOSPITALIZATION PROGRAM [address] [place], [place], [address] Phone: [phone]   Fax:  [phone]  Name: TABBITHA JANVRIN MRN: [id number] Date of Birth: 1971-08-06

## 2023-02-05 NOTE — Progress Notes (Signed)
Spoke with patient via Teams video call, used 2 identifiers to correctly identify patient. States that groups are going well. This is her last week and she will start IOP next week. On scale 1-10 as 10 being worst she rates depression at 2 and anxiety at 3. Denies SI/HI or AV hallucinations. PHQ9=2. No side effects from medications. No issues or complaints.

## 2023-02-05 NOTE — Progress Notes (Signed)
Sent in refills of trintellix and Topamax, patient clarified she is not on  tramadol.  PDMP confirms patient is not on tramadol currently.  Patient also endorsed interest in new psychiatrist outpatient, patient reports she is also concerned about the number and dosages of controlled substances she is taking; however this will likely need to be addressed by an outpatient provider.  PGY-3 Eliseo Gum, MD

## 2023-02-06 ENCOUNTER — Other Ambulatory Visit (HOSPITAL_COMMUNITY): Payer: Commercial Managed Care - PPO

## 2023-02-07 ENCOUNTER — Telehealth (HOSPITAL_COMMUNITY): Payer: Self-pay | Admitting: Psychiatry

## 2023-02-07 ENCOUNTER — Encounter (HOSPITAL_COMMUNITY): Payer: Self-pay

## 2023-02-07 NOTE — Therapy (Signed)
[facility] PARTIAL HOSPITALIZATION PROGRAM [address] [place], [place], [address] Phone: [phone]   Fax:  [phone]  Occupational Therapy Treatment Virtual Visit via Video Note  I connected with Emily Park on 02/07/23 at  8:00 AM EDT by a video enabled telemedicine application and verified that I am speaking with the correct person using two identifiers.  Location: Patient: home Provider: office   I discussed the limitations of evaluation and management by telemedicine and the availability of in person appointments. The patient expressed understanding and agreed to proceed.    The patient was advised to call back or seek an in-person evaluation if the symptoms worsen or if the condition fails to improve as anticipated.  I provided 55 minutes of non-face-to-face time during this encounter.   Patient Details  Name: Emily Park MRN: [id number] Date of Birth: Sep 23, 1970 No data recorded  Encounter Date: 02/05/2023   OT End of Session - 02/07/23 1033     Visit Number 10    Number of Visits 20    Date for OT Re-Evaluation 02/16/23    OT Start Time 1200    OT Stop Time 1255    OT Time Calculation (min) 55 min             Past Medical History:  Diagnosis Date   Abnormal TSH    ADD (attention deficit disorder)    Anemia    Anxiety    Arrhythmia    Decreased libido    Depression    Heart murmur    Hyperlipidemia    Hypokalemia    Migraine    Nephrolithiasis    Pap smear abnormality of cervix/human papillomavirus (HPV) positive    Sleep difficulties    SVT (supraventricular tachycardia)     Past Surgical History:  Procedure Laterality Date   ABLATION     Cardiac ablation, endometrial ablation   BREAST LUMPECTOMY     right breast   BREAST LUMPECTOMY Right    CARDIOVERSION     COLONOSCOPY WITH PROPOFOL N/A 05/16/2021   Procedure: COLONOSCOPY WITH PROPOFOL;  Surgeon: Charlott Rakes, MD;  Location: [facility];   Service: Endoscopy;  Laterality: N/A;   COLPOSCOPY     w/ cervical biopsy   DILITATION & CURRETTAGE/HYSTROSCOPY WITH NOVASURE ABLATION N/A 09/11/2014   Procedure: DILATATION & CURETTAGE/HYSTEROSCOPY WITH NOVASURE ABLATION;  Surgeon: Lenoard Aden, MD;  Location: [facility];  Service: Gynecology;  Laterality: N/A;   GIVENS CAPSULE STUDY N/A 05/16/2021   Procedure: GIVENS CAPSULE STUDY;  Surgeon: Charlott Rakes, MD;  Location: [facility];  Service: Endoscopy;  Laterality: N/A;   XI ROBOT ASSISTED RECTOPEXY N/A 07/07/2021   Procedure: XI ROBOT ASSISTED RECTOPEXY;  Surgeon: Karie Soda, MD;  Location: [facility];  Service: General;  Laterality: N/A;   XI ROBOTIC ASSISTED LOWER ANTERIOR RESECTION N/A 07/07/2021   Procedure: XI ROBOTIC ASSISTED LOW ANTERIOR RECTOSIGMOID RESECTION WITH RIGID PROCTOSCOPY, TAP BLOCK;  Surgeon: Karie Soda, MD;  Location: [facility];  Service: General;  Laterality: N/A;    There were no vitals filed for this visit.   Subjective Assessment - 02/07/23 1033     Currently in Pain? No/denies    Pain Score 0-No pain                  Group Session:  S: Doing better today. Looking forward to moving forward.   O: The session centered on the neuroscientific underpinnings of motivation and its inverse relationship with  procrastination. Through a review of current scientific literature, the group examined:  The function of dopamine as a critical neurotransmitter in the motivation circuitry of the brain. The physiological and psychological aspects of procrastination, including the impact of dopamine on the tendency to delay tasks. Strategies for leveraging an understanding of dopamine's role to enhance motivation, address procrastination, and foster productive habits. The objective was to provide a framework for participants to understand the neurochemical dynamics of motivation, relate these to personal experiences of procrastination, and apply evidence-based methods  to improve focus and drive in both professional and personal contexts.   A: The patient demonstrates a strong grasp of the material presented on the neurobiology of motivation and its implications for procrastination. They actively participated in discussions, showing an ability to connect scientific concepts to personal experiences. The patient exhibits insight into their own behavior patterns and expresses a keen interest in applying the strategies discussed to mitigate tendencies towards procrastination. The patient's engagement level indicates a readiness to implement behavioral changes that may enhance personal productivity and reduce procrastination.  OCCUPATIONAL THERAPY DISCHARGE SUMMARY  Visits from Start of Care: 10  Current functional level related to goals / functional outcomes: OT goals met / pt is ready for discharge.   Remaining deficits: No deficits remaining     Plan: Patient agrees to discharge.                      OT Education - 02/07/23 1033     Education Details Procrastination              OT Short Term Goals - 01/17/23 1930       OT SHORT TERM GOAL #1   Title Client will develop and utilize a personalized coping toolbox containing at least five coping strategies to manage challenging situations, demonstrating their use in real-life scenarios by the end of therapy.    Time 4    Period Weeks    Status On-going    Target Date 02/16/23      OT SHORT TERM GOAL #2   Title Client will independently identify and modify three areas of the current routine that contribute to increased stress or dysfunction by the end of therapy.    Time 4    Period Weeks    Status On-going    Target Date 02/16/23      OT SHORT TERM GOAL #3   Title By the time of discharge, client will independently set, track, and make progress towards a long-term goal, demonstrating resilience in overcoming obstacles and seeking support when needed.    Time 4    Period  Weeks    Status On-going                      Plan - 02/07/23 1034     Psychosocial Skills Routines and Behaviors;Interpersonal Interaction;Habits;Coping Strategies             Patient will benefit from skilled therapeutic intervention in order to improve the following deficits and impairments:       Psychosocial Skills: Routines and Behaviors, Interpersonal Interaction, Habits, Coping Strategies   Visit Diagnosis: Difficulty coping    Problem List Patient Active Problem List   Diagnosis Date Noted   MDD (major depressive disorder), recurrent severe, without psychosis (HCC) 12/28/2022   CKD stage 3a, GFR 45-59 ml/min (HCC) 12/25/2022   Chronic anemia 12/25/2022   Major depressive disorder, recurrent episode, severe (HCC) 12/25/2022  Generalized anxiety disorder 12/24/2022   Hypoglycemia 12/21/2022   Thyroid 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   Anxiety 02/24/2021   Heart murmur 02/24/2021   Menorrhagia 02/24/2021   Thyroid nodule 10/26/2020   Acute renal failure superimposed on stage 2 chronic kidney disease (HCC)    Hypotension    Hypovolemic shock (HCC) 08/29/2020   Rectal prolapse 04/05/2020   Migraine 02/27/2019   Mild episode of recurrent major depressive disorder (HCC) 10/25/2018   Abnormal thyroid stimulating hormone (TSH) level 10/24/2018   Syncope and collapse 10/10/2018   Lung nodules 11/14/2015    Paroxysmal SVT (supraventricular tachycardia) 11/13/2015   Attention deficit hyperactivity disorder (ADHD) 11/13/2015    Ted Mcalpine, OT 02/07/2023, 10:34 AM  Kerrin Champagne, OT   [facility] HOSPITALIZATION PROGRAM [address] [place], [place], [address] Phone: [phone]   Fax:  [phone]  Name: Emily Park MRN: [id number] Date of Birth: 05-Aug-1971

## 2023-02-08 ENCOUNTER — Encounter (HOSPITAL_COMMUNITY): Payer: Self-pay | Admitting: Psychiatry

## 2023-02-08 ENCOUNTER — Other Ambulatory Visit (HOSPITAL_COMMUNITY): Payer: Commercial Managed Care - PPO | Admitting: Licensed Clinical Social Worker

## 2023-02-08 DIAGNOSIS — F411 Generalized anxiety disorder: Secondary | ICD-10-CM

## 2023-02-08 DIAGNOSIS — F332 Major depressive disorder, recurrent severe without psychotic features: Secondary | ICD-10-CM

## 2023-02-08 DIAGNOSIS — Z79899 Other long term (current) drug therapy: Secondary | ICD-10-CM

## 2023-02-08 MED ORDER — CLONAZEPAM 0.5 MG PO TABS
0.5000 mg | ORAL_TABLET | Freq: Every day | ORAL | 0 refills | Status: DC
Start: 2023-02-08 — End: 2023-03-16

## 2023-02-08 MED ORDER — CLONAZEPAM 0.25 MG PO TBDP
0.2500 mg | ORAL_TABLET | Freq: Every day | ORAL | 0 refills | Status: DC
Start: 2023-02-08 — End: 2023-03-16

## 2023-02-08 NOTE — Progress Notes (Signed)
Virtual Visit via Video Note  I connected with Emily Park on @TODAY @ at  9:00 AM EDT by a video enabled telemedicine application and verified that I am speaking with the correct person using two identifiers.  Location: Patient: at home Provider: at office   I discussed the limitations of evaluation and management by telemedicine and the availability of in person appointments. The patient expressed understanding and agreed to proceed.  I discussed the assessment and treatment plan with the patient. The patient was provided an opportunity to ask questions and all were answered. The patient agreed with the plan and demonstrated an understanding of the instructions.   The patient was advised to call back or seek an in-person evaluation if the symptoms worsen or if the condition fails to improve as anticipated.  I provided 30 minutes of non-face-to-face time during this encounter.   Chestine Spore, RITA, M.Ed, CNA   Patient ID: Emily Park, female   DOB: 09/21/1970, 52 y.o.   MRN: [id number] D:  As per recent CCA states:  " Emily Park is a 52yo female referred to [facility] by [facility] following a suicide attempt and subsequent 7 day psych admission. She cites her stressors as her declining physical health and inability to work, as she previously worked as an [employer] fundraiser. Regarding ADLs, she endorses normal hygiene but decreased household tasks. She reports she has been seeing Dr. Milagros Evener for med man for 11 years and has been diagnosed with ADHD, anxiety, depression, and OCPD. She denies other treatment history and psych hospitalizations, previous suicide attempts, NSSI, substance abuse, HI, AVH, and current SI. She reports a great aunt and cousin died by suicide. She cites her husband, sons, sisters, parents, and husband's family as her supports. She currently lives with her husband and her 25yo son lives with them part time. She reports she had been taking her husband's Xanax and Gabapentin the week leading to  her hospitalization and since d/c the meds have been secured. She reports complex medical history to include recent GI ulcers, IBS, anemia, and hx of rectal prolapse. She states there are no firearms in her home.   Current Symptoms/Problems: sadness, hopelessness, worrying about the future, isolating, decreased appetite, lost 5lb in two months, decreased sleep (waking up throughout the night)"  Pt stepped down to virtual MH-IOP from virtual PHP today (02-08-23).  Reports PHP was very helpful in learning coping skills.  States she has put all her handouts in a notebook for future reference.  "I am so proud of myself because I am keeping busy.  I was able to cook and clean the whole house yesterday after taking my husband for a procedure."  On a scale of 1-10 (10 being the worst); pt rates her depression at a 5 and her anxiety at a 7.  Denies SI/HI or A/V hallucinations. A:  Oriented pt.  Pt is requesting a new psychiatrist and would like a referral to a therapist. Oriented pt.  Pt was advised of ROI must be obtained prior to any records release in order to collaborate her care with an outside provider.  Pt was advised if she has not already done so to contact the front desk to sign all necessary forms in order for MH-IOP to release info re: her care.  Consent:  Pt gives verbal consent for tx and assignment of benefits for services provided during this telehealth group process.  Pt expressed understanding and agreed to proceed. Collaboration of care:  Collaborate with Dr. Gerlean Ren  McQuilla AEB,and [name], LCSW AEB.  Encouraged support groups through [facility]. Pt will improve her mood as evidenced by being happy again, managing her mood and coping with daily stressors for 5 out of 7 days for 60 days. R:  Pt receptive.   Jeri Modena, M.Ed,CNA
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
## 2023-02-08 NOTE — Progress Notes (Signed)
Virtual Visit via Video Note   I connected with Winfield Rast. Goode on 02/08/23 at  9:00 AM EDT by a video enabled telemedicine application and verified that I am speaking with the correct person using two identifiers.   At orientation to the IOP program, Case Manager discussed the limitations of evaluation and management by telemedicine and the availability of in person appointments. The patient expressed understanding and agreed to proceed with virtual visits throughout the duration of the program.   Location:  Patient: Patient Home Provider: [facility]   History of Present Illness: MDD and GAD  Observations/Objective: Check In: Case Manager checked in with all participants to review discharge dates, insurance authorizations, work-related documents and needs from the treatment team regarding medications. Halli stated needs and engaged in discussion.    Initial Therapeutic Activity: Counselor facilitated a check-in with Langston to assess for safety, sobriety and medication compliance.  Counselor also inquired about Annett's current emotional ratings, as well as any significant changes in thoughts, feelings or behavior since previous check in.  Hasina presented for session on time and was alert, oriented x5, with no evidence or self-report of active SI/HI or A/V H.  Arista reported compliance with medication and denied use of alcohol or illicit substances.  Kashay reported scores of 5/10 for depression, 7/10 for anxiety, and 0/10 for anger/irritability.  Diesha denied any recent outbursts or panic attacks.  Lucciana reported that a recent success was completing PHP and making the decision to step down to [facility] in order to maintain support and continue building coping skills.  Masayo reported that an additional success was completing several household chores yesterday, stating "I felt very productive".  Alissah reported that her goal today is to take a mile long walk with her dog.       Second Therapeutic Activity: Counselor  covered topic of core beliefs with group today.  Counselor virtually shared a handout on the subject, which explained how everyone looks at the world differently, and two people can have the same experience, but have different interpretations of what happened.  Members were encouraged to think of these like sunglasses with different "shades" influencing perception towards positive or negative outcomes.  Examples of negative core beliefs were provided, such as "I'm unlovable", "I'm not good enough", and "I'm a bad person".  Members were asked to share which one(s) they could relate to, and then identify evidence which contradicts these beliefs.  Counselor also provided psychoeducation on positive affirmations today.  Counselor explained how these are positive statements which can be spoken out loud or recited mentally to challenge negative thoughts and/or core beliefs to improve mood and outlook each day.  Counselor shared a comprehensive list of affirmations virtually to members with different categories, including ones for health, confidence, success, and happiness.  Counselor invited members to look through this list and identify any which resonated with them, and practice saying them out loud with sincerity.  Intervention was effective, as evidenced by [name] successfully participating in discussion on the subject and reporting that she could relate to several negative core beliefs listed on the handout, such as "I am a failure", "People can't be trusted", and "Nothing ever goes right".  Sonora was able to successfully challenge the core belief "I am a loser" by identifying evidence that contradicted it, including the fact that she was able to successfully complete PHP, she is taking walks more often, and building motivation to do more things around the home each day.  [name]  also reported that she liked several of the positive affirmations listed, such as "I learn from my challenges and find ways to overcome them", "I  can do whatever I focus my mind on", and "I see perfection in my virtues and flaws".    Assessment and Plan: Counselor recommends that Anyelina remain in IOP treatment to better manage mental health symptoms, ensure stability and pursue completion of treatment plan goals. Counselor recommends adherence to crisis/safety plan, taking medications as prescribed, and following up with medical professionals if any issues arise.    Follow Up Instructions: Counselor will send Webex link for session tomorrow.  Vaudie was advised to call back or seek an in-person evaluation if the symptoms worsen or if the condition fails to improve as anticipated.   Collaboration of Care:   Medication Management AEB Dr. Eliseo Gum or Hillery Jacks, NP                                          Case Manager AEB Jeri Modena, CNA   Patient/Guardian was advised Release of Information must be obtained prior to any record release in order to collaborate their care with an outside provider. Patient/Guardian was advised if they have not already done so to contact the registration department to sign all necessary forms in order for [name] to release information regarding their care.   Consent: Patient/Guardian gives verbal consent for treatment and assignment of benefits for services provided during this visit. Patient/Guardian expressed understanding and agreed to proceed.  I provided 180 minutes of non-face-to-face time during this encounter.   [name], [place], LCAS 02/08/23

## 2023-02-09 ENCOUNTER — Other Ambulatory Visit (HOSPITAL_COMMUNITY): Payer: Commercial Managed Care - PPO

## 2023-02-09 ENCOUNTER — Other Ambulatory Visit (HOSPITAL_COMMUNITY): Payer: Self-pay | Admitting: Family

## 2023-02-09 DIAGNOSIS — F411 Generalized anxiety disorder: Secondary | ICD-10-CM

## 2023-02-09 DIAGNOSIS — F332 Major depressive disorder, recurrent severe without psychotic features: Secondary | ICD-10-CM | POA: Diagnosis not present

## 2023-02-12 ENCOUNTER — Other Ambulatory Visit (HOSPITAL_COMMUNITY): Payer: Commercial Managed Care - PPO | Admitting: Psychiatry

## 2023-02-12 ENCOUNTER — Telehealth (HOSPITAL_COMMUNITY): Payer: Self-pay | Admitting: Psychiatry

## 2023-02-13 ENCOUNTER — Other Ambulatory Visit (HOSPITAL_COMMUNITY): Payer: Commercial Managed Care - PPO

## 2023-02-13 NOTE — Psych (Signed)
Virtual Visit via Video Note  I connected with Emily Park on 01/29/23 at  9:00 AM EDT by a video enabled telemedicine application and verified that I am speaking with the correct person using two identifiers.  Location: Patient: patient home Provider: clinical home office   I discussed the limitations of evaluation and management by telemedicine and the availability of in person appointments. The patient expressed understanding and agreed to proceed.  I discussed the assessment and treatment plan with the patient. The patient was provided an opportunity to ask questions and all were answered. The patient agreed with the plan and demonstrated an understanding of the instructions.   The patient was advised to call back or seek an in-person evaluation if the symptoms worsen or if the condition fails to improve as anticipated.  Pt was provided 240 minutes of non-face-to-face time during this encounter.   Donia Guiles, LCSW   [facility] PHP THERAPIST PROGRESS NOTE  REBECCA JACQUART [id number]  Session Time: 9:00 - 10:00  Participation Level: Active  Behavioral Response: CasualAlertDepressed  Type of Therapy: Group Therapy  Treatment Goals addressed: Coping  Progress Towards Goals: Progressing  Interventions: CBT, DBT, Supportive, and Reframing  Summary: Emily Park is a 52 y.o. female who presents with depression and anxiety symptoms.  Clinician led check-in regarding current stressors and situation, and review of patient completed daily inventory. Clinician utilized active listening and empathetic response and validated patient emotions. Clinician facilitated processing group on pertinent issues.?    Therapist Response: Patient arrived within time allowed. Patient rates her mood at a 7 on a scale of 1-10 with 10 being best. Pt states she feels "tired." Pt states she slept 6 hours and ate 1-2x. Pt reports Saturday was busy and she ended up doing chores and errands for  multiple people. Pt reports she went to church on Sunday and felt low energy. Pt struggles with saying no. Patient able to process. Patient engaged in discussion.         Session Time: 10:00 am - 11:00 am   Participation Level: Active   Behavioral Response: CasualAlertDepressed   Type of Therapy: Group Therapy   Treatment Goals addressed: Coping   Progress Towards Goals: Progressing   Interventions: CBT, DBT, Solution Focused, Strength-based, Supportive, and Reframing   Therapist Response: Cln introduced wellness topic of sleep hygiene. Cln discussed ways in which poor sleep affects our mood and overall wellness. Cln provided education on sleep hygiene techniques and principles. Group discussed their current sleep issues and how they can apply sleep hygiene skills to improve their quality of sleep.    Therapist Response:  Pt engaged in discussion and identifies which sleep hygiene skill they will apply first.            Session Time: 11:00 -12:00   Participation Level: Active   Behavioral Response: CasualAlertDepressed   Type of Therapy: Group Therapy   Treatment Goals addressed: Coping   Progress Towards Goals: Progressing   Interventions: CBT, DBT, Solution Focused, Strength-based, Supportive, and Reframing   Summary: Cln led discussion on negative self-talk and how it affects [name]. Cln utilized CBT to discuss how thoughts shape our feelings and actions. Group members shared how negative thinking affects them and worked to reframe their negative thinking.    Therapist Response:  Pt engaged in discussion and reports understanding.        Session Time: 12:00 -1:00   Participation Level: Active   Behavioral Response: CasualAlertDepressed   Type of  Therapy: Group therapy, Occupational Therapy   Treatment Goals addressed: Coping   Progress Towards Goals: Progressing   Interventions: Supportive; Psychoeducation   Summary: 12:00 - 12:50: Occupational Therapy group  led by cln E. Hollan. 12:50 - 1:00 Clinician assessed for immediate needs, medication compliance and efficacy, and safety concerns.   Therapist Response: 12:00 - 12:50: See OT note 12:50 - 1:00 pm: At check-out, patient reports no immediate concerns. Patient demonstrates progress as evidenced by continued engagement and responsiveness to treatment. Patient denies SI/HI/self-harm thoughts at the end of group.    Suicidal/Homicidal: Nowithout intent/plan   Plan: Pt will continue in PHP while working to stabilize post-hospitalization, decrease depression and anxiety symptoms, and increase ability to manage symptoms in a healthy manner.   Collaboration of Care: Medication Management AEB J. McQuilla  Patient/Guardian was advised Release of Information must be obtained prior to any record release in order to collaborate their care with an outside provider. Patient/Guardian was advised if they have not already done so to contact the registration department to sign all necessary forms in order for [name] to release information regarding their care.   Consent: Patient/Guardian gives verbal consent for treatment and assignment of benefits for services provided during this visit. Patient/Guardian expressed understanding and agreed to proceed.   Diagnosis: MDD (major depressive disorder), recurrent severe, without psychosis (HCC) [F33.2]    1. MDD (major depressive disorder), recurrent severe, without psychosis (HCC)   2. Generalized anxiety disorder       Donia Guiles, LCSW

## 2023-02-14 ENCOUNTER — Telehealth (HOSPITAL_COMMUNITY): Payer: Self-pay | Admitting: Psychiatry

## 2023-02-14 ENCOUNTER — Other Ambulatory Visit (HOSPITAL_COMMUNITY): Payer: Commercial Managed Care - PPO | Admitting: Psychiatry

## 2023-02-14 NOTE — Telephone Encounter (Signed)
D:  According to group facilitator.  Pt sent an email stating she wouldn't be attending virtual MH-IOP again today d/t illness.  This is patient's third absence and she just started on 02-08-23.  A:  Placed call to pt.  According to pt, she has been sick on her stomach and vomiting.  "I feel a little better but I am just very tired today, but I will return tomorrow." Discussed group expectations with pt.  Informed pt, the case mgr will discuss with the treatment team.  Mentioned to pt, if she's not allowed to return d/t absences, the case mgr will call her back with f/u appts.  R:  Pt receptive.

## 2023-02-15 ENCOUNTER — Other Ambulatory Visit (HOSPITAL_COMMUNITY): Payer: Commercial Managed Care - PPO | Admitting: Licensed Clinical Social Worker

## 2023-02-15 ENCOUNTER — Other Ambulatory Visit (HOSPITAL_COMMUNITY): Payer: Self-pay | Admitting: Student in an Organized Health Care Education/Training Program

## 2023-02-15 DIAGNOSIS — F411 Generalized anxiety disorder: Secondary | ICD-10-CM

## 2023-02-15 DIAGNOSIS — F332 Major depressive disorder, recurrent severe without psychotic features: Secondary | ICD-10-CM | POA: Diagnosis not present

## 2023-02-15 MED ORDER — DOXEPIN HCL 75 MG PO CAPS: 75.0000 mg | ORAL_CAPSULE | Freq: Every day | ORAL | 0 refills | Status: DC

## 2023-02-15 NOTE — Progress Notes (Addendum)
Virtual Visit via Video Note   I connected with Emily Florida. Park on 02/15/23 at  9:00 AM EDT by a video enabled telemedicine application and verified that I am speaking with the correct person using two identifiers.   At orientation to the IOP program, Case Manager discussed the limitations of evaluation and management by telemedicine and the availability of in person appointments. The patient expressed understanding and agreed to proceed with virtual visits throughout the duration of the program.   Location:  Patient: Patient Home Provider: [facility]   History of Present Illness: MDD and GAD  Observations/Objective: Check In: Case Manager checked in with all participants to review discharge dates, insurance authorizations, work-related documents and needs from the treatment team regarding medications. Emily Park stated needs and engaged in discussion.    Initial Therapeutic Activity: Counselor facilitated a check-in with Emily Park to assess for safety, sobriety and medication compliance.  Counselor also inquired about Emily Park's current emotional ratings, as well as any significant changes in thoughts, feelings or behavior since previous check in.  Emily Park presented for session on time and was alert, oriented x5, with no evidence or self-report of active SI/HI or A/V H.  Emily Park reported compliance with medication and denied use of alcohol or illicit substances.  Emily Park reported scores of 4/10 for depression, 6/10 for anxiety, and 0/10 for anger/irritability.  Emily Park denied any recent outbursts or panic attacks.  Emily Park reported that a recent success was seeing her children over weekend, and making homemade spaghetti and meatballs for them.  Emily Park reported that a recent struggle was experiencing illness over the past few days, which led her to miss group, stating "I wasn't able to keep anything down".  Emily Park reported that her goal today is to take a walk outside for exercise since she has been stuck inside for a few days.        Second Therapeutic Activity: Counselor engaged the group in discussion on managing work/life balance today to improve mental health and wellness.  Counselor explained how finding balance between responsibilities at home and work place can be challenging, and lead to increased stress.  Counselor facilitated discussion on what challenges members are currently, or have historically faced.  Counselor also discussed strategies for improving work/life balance while members work on their mental health during treatment.  Some of these included keeping track of time management; creating a list of priorities and scaling importance; setting realistic, measurable goals each day; establishing boundaries; taking care of health needs; and nurturing relationships at home and work for support.  Counselor inquired about areas where members feel they are excelling, as well as areas they could focus on during treatment. 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  Emily Park stated "The expectations and patients were not manageable.  You couldn't do it.  I couldn't give adequate care to patients".  Emily Park reported that she experienced several symptoms of burnout, including feeling tired and drained, withdrawing from responsibilities, lacking motivation, and feeling overwhelmed.  Emily Park reported that there were also numerous warning signs such as noticing physical health declining, neglecting social aspects of her life due to her schedule, skipping breaks, and getting out of shape.  Emily Park was receptive to suggestions offered today for addressing work life imbalance, including taking time each day to create a realistic to-do  list, working on refusal skills in order to say "No" to unmanageable tasks and set healthier boundaries.  Assessment and  Plan: Counselor recommends that Emily Park remain in IOP treatment to better manage mental health symptoms, ensure stability and pursue completion of treatment plan goals. Counselor recommends adherence to crisis/safety plan, taking medications as prescribed, and following up with medical professionals if any issues arise.    Follow Up Instructions: Counselor will send Webex link for session tomorrow.  Emily Park was advised to call back or seek an in-person evaluation if the symptoms worsen or if the condition fails to improve as anticipated.   Collaboration of Care:   Medication Management AEB Dr. Eliseo Gum or Hillery Jacks, NP                                          Case Manager AEB Jeri Modena, CNA   Patient/Guardian was advised Release of Information must be obtained prior to any record release in order to collaborate their care with an outside provider. Patient/Guardian was advised if they have not already done so to contact the registration department to sign all necessary forms in order for [name] to release information regarding their care.   Consent: Patient/Guardian gives verbal consent for treatment and assignment of benefits for services provided during this visit. Patient/Guardian expressed understanding and agreed to proceed.  I provided 180 minutes of non-face-to-face time during this encounter.   [name], LCSW, LCAS 02/15/23

## 2023-02-16 ENCOUNTER — Other Ambulatory Visit (HOSPITAL_COMMUNITY): Payer: Commercial Managed Care - PPO | Admitting: Licensed Clinical Social Worker

## 2023-02-16 DIAGNOSIS — F411 Generalized anxiety disorder: Secondary | ICD-10-CM

## 2023-02-16 DIAGNOSIS — F332 Major depressive disorder, recurrent severe without psychotic features: Secondary | ICD-10-CM

## 2023-02-16 NOTE — Progress Notes (Signed)
Virtual Visit via Video Note   I connected with Emily Florida. Park on 02/16/23 at  9:00 AM EDT by a video enabled telemedicine application and verified that I am speaking with the correct person using two identifiers.   At orientation to the IOP program, Case Manager discussed the limitations of evaluation and management by telemedicine and the availability of in person appointments. The patient expressed understanding and agreed to proceed with virtual visits throughout the duration of the program.   Location:  Patient: Patient Home Provider: [facility]   History of Present Illness: MDD and GAD  Observations/Objective: Check In: Case Manager checked in with all participants to review discharge dates, insurance authorizations, work-related documents and needs from the treatment team regarding medications. Emily Park stated needs and engaged in discussion.    Initial Therapeutic Activity: Counselor facilitated a check-in with Emily Park to assess for safety, sobriety and medication compliance.  Counselor also inquired about Emily Park's current emotional ratings, as well as any significant changes in thoughts, feelings or behavior since previous check in.  Emily Park presented for session on time and was alert, oriented x5, with no evidence or self-report of active SI/HI or A/V H.  Emily Park reported compliance with medication and denied use of alcohol or illicit substances.  Emily Park reported scores of 3/10 for depression, 3/10 for anxiety, and 0/10 for anger/irritability.  Emily Park denied any recent outbursts or panic attacks.  Emily Park reported that a recent success was assisting her mother with a doctor's appointment yesterday.  Emily Park reported that a recent struggle was finding that this took up so much time, she wasn't able to take the walk she originally planned for.  Emily Park reported that her goal this weekend is to visit family and grab a nice dinner together.       Second Therapeutic Activity: Counselor introduced topic of assertive  communication today.  Counselor shared various handouts with members virtually in group to read along with on the subject.  These handouts defined assertive communication as a communication style in which a person stands up for their own needs and wants, while also taking into consideration the needs and wants of others, without behaving in a passive or aggressive way.  Traits of assertive communicators were highlighted such as using appropriate speaking volume, maintaining eye contact, using confident language, and avoiding interruption.  Members were also provided with tips on how to improve communication, including respecting oneself, expressing thoughts and feelings calmly, and saying "No" when necessary.  Members were given a variety of scenarios where they could practice using these tips to respond in an assertive manner.  Intervention was effective, as evidenced by Emily Park participating in discussion on topic, reporting that she has a passive communication style due to traits such as prioritizing the needs of others, allowing others to take advantage, and not expressing her own needs or wants.  Emily Park reported that this has led to issues such as taking on the burdens of family members and friends, which has led her to neglect her own needs at times.  Emily Park showed more effective use of assertive communication skills through engagement in roleplay activities.    Assessment and Plan: Counselor recommends that Emily Park remain in IOP treatment to better manage mental health symptoms, ensure stability and pursue completion of treatment plan goals. Counselor recommends adherence to crisis/safety plan, taking medications as prescribed, and following up with medical professionals if any issues arise.    Follow Up Instructions: Counselor will send Webex link for session tomorrow.  Emily Park  was advised to call back or seek an in-person evaluation if the symptoms worsen or if the condition fails to improve as anticipated.    Collaboration of Care:   Medication Management AEB Dr. Eliseo Gum or Hillery Jacks, NP                                          Case Manager AEB Jeri Modena, CNA   Patient/Guardian was advised Release of Information must be obtained prior to any record release in order to collaborate their care with an outside provider. Patient/Guardian was advised if they have not already done so to contact the registration department to sign all necessary forms in order for [name] to release information regarding their care.   Consent: Patient/Guardian gives verbal consent for treatment and assignment of benefits for services provided during this visit. Patient/Guardian expressed understanding and agreed to proceed.  I provided 180 minutes of non-face-to-face time during this encounter.   [name], [place], LCAS 02/16/23

## 2023-02-19 ENCOUNTER — Other Ambulatory Visit (HOSPITAL_COMMUNITY): Payer: Commercial Managed Care - PPO | Admitting: Licensed Clinical Social Worker

## 2023-02-19 DIAGNOSIS — F332 Major depressive disorder, recurrent severe without psychotic features: Secondary | ICD-10-CM

## 2023-02-19 DIAGNOSIS — F411 Generalized anxiety disorder: Secondary | ICD-10-CM

## 2023-02-19 NOTE — Progress Notes (Signed)
Virtual Visit via Video Note   I connected with Emily Park. [place] on 02/19/23 at  9:00 AM EDT by a video enabled telemedicine application and verified that I am speaking with the correct person using two identifiers.   At orientation to the IOP program, Case Manager discussed the limitations of evaluation and management by telemedicine and the availability of in person appointments. The patient expressed understanding and agreed to proceed with virtual visits throughout the duration of the program.   Location:  Patient: Patient Home Provider: [facility]   History of Present Illness: MDD and GAD  Observations/Objective: Check In: Case Manager checked in with all participants to review discharge dates, insurance authorizations, work-related documents and needs from the treatment team regarding medications. Nou stated needs and engaged in discussion.    Initial Therapeutic Activity: Counselor facilitated a check-in with Shadow to assess for safety, sobriety and medication compliance.  Counselor also inquired about Emily Park's current emotional ratings, as well as any significant changes in thoughts, feelings or behavior since previous check in.  Emily Park presented for session on time and was alert, oriented x5, with no evidence or self-report of active SI/HI or A/V H.  Emily Park reported compliance with medication and denied use of alcohol or illicit substances.  Emily Park reported scores of 3/10 for depression, 7/10 for anxiety, and 0/10 for anger/irritability.  Emily Park denied any recent outbursts or panic attacks.  Emily Park reported that a recent success was having a slower paced weekend, which involved doing a few yardwork tasks, watching a movie, and grabbing a nice dinner.  Emily Park reported that a recent struggle was learning that her husband's hemoglobin levels are low, which has her concerned.  Emily Park reported that her goal today is to assist her husband in scheduling a follow up appointment to have recent symptoms assessed.        Second Therapeutic Activity: Counselor introduced topic of self-care today.  Counselor explained how this can be defined as the things one does to maintain good health and improve well-being.  Counselor provided members with a self-care assessment form to complete.  This handout featured various sub-categories of self-care, including physical, psychological/emotional, social, spiritual, and professional.  Members were asked to rank their engagement in the activities listed for each dimension on a scale of 1-3, with 1 indicating 'Poor', 2 indicating 'Ok', and 3 indicating 'Well'.  Counselor invited members to share results of their assessment, and inquired about which areas of self-care they are doing well in, as well as areas that require attention, and how they plan to begin addressing this during treatment.  Intervention was effective, as evidenced by Emily Park successfully completing initial 2 sections of assessment and actively engaging in discussion on subject, reporting that she is excelling in areas such as maintaining personal hygiene, wearing clothes that make her feel good, getting enough sleep, and attending preventative medical appointments, but would benefit from focusing more on areas such as eating healthy foods, eating regularly, and resting when sick.  Emily Park reported that they would work to improve self-care deficits by eating smaller meals throughout the day until appetite returns, establishing an IBS friendly diet with her doctor to manage flareups more effectively, and being more mindful of warning signs that could indicate she is getting sick and needs to take time to rest.    Assessment and Plan: Counselor recommends that Emily Park remain in IOP treatment to better manage mental health symptoms, ensure stability and pursue completion of treatment plan goals. Counselor recommends adherence to  crisis/safety plan, taking medications as prescribed, and following up with medical professionals if any  issues arise.    Follow Up Instructions: Counselor will send Webex link for session tomorrow.  [name] was advised to call back or seek an in-person evaluation if the symptoms worsen or if the condition fails to improve as anticipated.   Collaboration of Care:   Medication Management AEB Dr. Eliseo Gum or Hillery Jacks, NP                                          Case Manager AEB Jeri Modena, CNA   Patient/Guardian was advised Release of Information must be obtained prior to any record release in order to collaborate their care with an outside provider. Patient/Guardian was advised if they have not already done so to contact the registration department to sign all necessary forms in order for Emily Park to release information regarding their care.   Consent: Patient/Guardian gives verbal consent for treatment and assignment of benefits for services provided during this visit. Patient/Guardian expressed understanding and agreed to proceed.  I provided 180 minutes of non-face-to-face time during this encounter.   Emily Park, [place], LCAS 02/19/23

## 2023-02-20 ENCOUNTER — Other Ambulatory Visit (HOSPITAL_COMMUNITY): Payer: Commercial Managed Care - PPO | Admitting: Licensed Clinical Social Worker

## 2023-02-20 DIAGNOSIS — F332 Major depressive disorder, recurrent severe without psychotic features: Secondary | ICD-10-CM | POA: Diagnosis not present

## 2023-02-20 DIAGNOSIS — F411 Generalized anxiety disorder: Secondary | ICD-10-CM

## 2023-02-20 NOTE — Progress Notes (Signed)
Virtual Visit via Video Note   I connected with Emily Park. Emily Park on 02/20/23 at  9:00 AM EDT by a video enabled telemedicine application and verified that I am speaking with the correct person using two identifiers.   At orientation to the IOP program, Case Manager discussed the limitations of evaluation and management by telemedicine and the availability of in person appointments. The patient expressed understanding and agreed to proceed with virtual visits throughout the duration of the program.   Location:  Patient: Patient Home Provider: [facility]   History of Present Illness: MDD and GAD  Observations/Objective: Check In: Case Manager checked in with all participants to review discharge dates, insurance authorizations, work-related documents and needs from the treatment team regarding medications. Emily Park stated needs and engaged in discussion.    Initial Therapeutic Activity: Counselor facilitated a check-in with Emily Park to assess for safety, sobriety and medication compliance.  Counselor also inquired about Emily Park's current emotional ratings, as well as any significant changes in thoughts, feelings or behavior since previous check in.  Emily Park presented for session on time and was alert, oriented x5, with no evidence or self-report of active SI/HI or A/V H.  Emily Park reported compliance with medication and denied use of alcohol or illicit substances.  Emily Park reported that she had a very stressful day yesterday due to taking care of her husband and felt very overwhelmed.  She reported that she didn't want to talk in depth about this during check-in, due to worrying about her husband being at home today, him hearing about her caretaking stress, and hurting his feelings.    Second Therapeutic Activity: Counselor introduced topic of anger management today.  Counselor virtually shared a handout with members on this subject featuring a variety of coping skills, and facilitated discussion on these approaches.   Examples included raising awareness of anger triggers, practicing deep breathing, keeping an anger log to better understand episodes, using diversion activities to distract oneself for 30 minutes, taking a time out when necessary, and being mindful of warning signs tied to thoughts or behavior.  Counselor inquired about which techniques group members have used before, what has proved to be helpful, what their unique warning signs might be, as well as what they will try out in the future to assist with de-escalation.  Intervention was effective, as evidenced by Emily Park participating in discussion on activity, and reporting that she believes her childhood had a large impact on her struggle with anger, as she had to be in charge of things as the eldest child, stating "I've always had control issues because of that".  Emily Park reported that her triggers include being treated unfairly, feeling helpless, out of control, judged for her work, or being lied to.  Emily Park reported that warning signs include headaches or migraines, tearfulness, and feeling sick to her stomach.  Emily Park reported that she will work to manage anger more effectively by using coping skills such as keeping an anger journal to track progress with anger management day to day, take timeouts when needed to deescalate, use deep breathing to calm down, and develop assertive communication skills to stand up for herself and address ongoing interpersonal problems.  Assessment and Plan: Counselor recommends that Emily Park remain in IOP treatment to better manage mental health symptoms, ensure stability and pursue completion of treatment plan goals. Counselor recommends adherence to crisis/safety plan, taking medications as prescribed, and following up with medical professionals if any issues arise.    Follow Up Instructions: Counselor will send  Webex link for session tomorrow.  Emily Park was advised to call back or seek an in-person evaluation if the symptoms worsen or if the  condition fails to improve as anticipated.   Collaboration of Care:   Medication Management AEB Dr. Eliseo Gum or Hillery Jacks, NP                                          Case Manager AEB Jeri Modena, CNA   Patient/Guardian was advised Release of Information must be obtained prior to any record release in order to collaborate their care with an outside provider. Patient/Guardian was advised if they have not already done so to contact the registration department to sign all necessary forms in order for Emily Park to release information regarding their care.   Consent: Patient/Guardian gives verbal consent for treatment and assignment of benefits for services provided during this visit. Patient/Guardian expressed understanding and agreed to proceed.  I provided 180 minutes of non-face-to-face time during this encounter.   [name], [place], LCAS 02/20/23

## 2023-02-21 ENCOUNTER — Other Ambulatory Visit (HOSPITAL_COMMUNITY): Payer: Commercial Managed Care - PPO | Admitting: Licensed Clinical Social Worker

## 2023-02-21 DIAGNOSIS — F332 Major depressive disorder, recurrent severe without psychotic features: Secondary | ICD-10-CM

## 2023-02-21 DIAGNOSIS — F411 Generalized anxiety disorder: Secondary | ICD-10-CM

## 2023-02-21 NOTE — Progress Notes (Signed)
Virtual Visit via Video Note   I connected with Emily Park on 02/21/23 at  9:00 AM EDT by a video enabled telemedicine application and verified that I am speaking with the correct person using two identifiers.   At orientation to the IOP program, Case Manager discussed the limitations of evaluation and management by telemedicine and the availability of in person appointments. The patient expressed understanding and agreed to proceed with virtual visits throughout the duration of the program.   Location:  Patient: Patient Home Provider: [facility]   History of Present Illness: MDD and GAD  Observations/Objective: Check In: Case Manager checked in with all participants to review discharge dates, insurance authorizations, work-related documents and needs from the treatment team regarding medications. Emily Park stated needs and engaged in discussion.    Initial Therapeutic Activity: Counselor facilitated a check-in with Emily Park to assess for safety, sobriety and medication compliance.  Counselor also inquired about Emily Park's current emotional ratings, as well as any significant changes in thoughts, feelings or behavior since previous check in.  Emily Park presented for session on time and was alert, oriented x5, with no evidence or self-report of active SI/HI or A/V H.  Emily Park reported compliance with medication and denied use of alcohol or illicit substances.  Emily Park reported scores of 5/10 for depression, 6/10 for anxiety, and 6/10 for irritability.  Emily Park denied any recent outbursts or panic attacks.  Emily Park reported that a recent success was talking to a friend this morning to lift her spirits and taking time to rest yesterday in the hammock outside.  Emily Park reported that a recent struggle was worrying about her husband's health yesterday, which was overwhelming at times.  Emily Park reported that her goal today is to take a walk, and do some cleaning around the home to stay productive.       Second Therapeutic Activity:  Counselor introduced topic of building a social support network today.  Counselor explained how this can be defined as having a having a group of healthy people in one's life you can talk to, spend time with, and get help from to improve both mental and physical health.  Counselor noted that some barriers can make it difficult to connect with other people, including the presence of anxiety or depression, or moving to an unfamiliar area.  Group members were asked to assess the current state of their support network, and identify ways that this could be improved.  Tips were given on how to address previously noted barriers, such as strengthening social skills, using relaxation techniques to reduce anxiety, scheduling social time each week, and/or exploring social events nearby which could increase chances of meeting new supports.  Members were also encouraged to consider getting closer to people they already know through suggestions such as outreaching someone by text, email or phone call if they haven't spoken in awhile, doing something nice for a friend/family member unexpectedly, and/or inviting someone over for a game/movie/dinner night.  Intervention was effective, as evidenced by Emily Park actively participating in discussion on the subject, and reporting that she would benefit from strengthening her support network, as mental and physical health challenges led her to lose touch over the past year, and led to worsening depression and anxiety.  Emily Park stated "Everything went downhill and I cut contact with a lot of people".  She reported that her goal will be to outreach friends that she lost touch with and make plans with them, find a new church that has a community more in  line with her beliefs, volunteer at the animal shelter, and take a Spanish class, since she could meet people while learning a new skill.     Assessment and Plan: Counselor recommends that Emily Park remain in IOP treatment to better manage mental  health symptoms, ensure stability and pursue completion of treatment plan goals. Counselor recommends adherence to crisis/safety plan, taking medications as prescribed, and following up with medical professionals if any issues arise.    Follow Up Instructions: Counselor will send Webex link for session tomorrow.  Emily Park was advised to call back or seek an in-person evaluation if the symptoms worsen or if the condition fails to improve as anticipated.   Collaboration of Care:   Medication Management AEB Dr. Eliseo Gum or Hillery Jacks, NP                                          Case Manager AEB Jeri Modena, CNA   Patient/Guardian was advised Release of Information must be obtained prior to any record release in order to collaborate their care with an outside provider. Patient/Guardian was advised if they have not already done so to contact the registration department to sign all necessary forms in order for [name] to release information regarding their care.   Consent: Patient/Guardian gives verbal consent for treatment and assignment of benefits for services provided during this visit. Patient/Guardian expressed understanding and agreed to proceed.  I provided 180 minutes of non-face-to-face time during this encounter.   Emily Stain, LCSW, LCAS 02/21/23

## 2023-02-22 ENCOUNTER — Ambulatory Visit: Payer: Commercial Managed Care - PPO | Admitting: Internal Medicine

## 2023-02-22 ENCOUNTER — Other Ambulatory Visit (HOSPITAL_COMMUNITY): Payer: Commercial Managed Care - PPO | Admitting: Licensed Clinical Social Worker

## 2023-02-22 DIAGNOSIS — F332 Major depressive disorder, recurrent severe without psychotic features: Secondary | ICD-10-CM | POA: Diagnosis not present

## 2023-02-22 DIAGNOSIS — F411 Generalized anxiety disorder: Secondary | ICD-10-CM

## 2023-02-22 NOTE — Progress Notes (Signed)
Virtual Visit via Video Note   I connected with Emily Park. [place] on 02/22/23 at  9:00 AM EDT by a video enabled telemedicine application and verified that I am speaking with the correct person using two identifiers.   At orientation to the IOP program, Case Manager discussed the limitations of evaluation and management by telemedicine and the availability of in person appointments. The patient expressed understanding and agreed to proceed with virtual visits throughout the duration of the program.   Location:  Patient: Patient Home Provider: [facility]   History of Present Illness: MDD and GAD  Observations/Objective: Check In: Case Manager checked in with all participants to review discharge dates, insurance authorizations, work-related documents and needs from the treatment team regarding medications. [name] stated needs and engaged in discussion.    Initial Therapeutic Activity: Counselor facilitated a check-in with Emily Park to assess for safety, sobriety and medication compliance.  Counselor also inquired about Emily Park's current emotional ratings, as well as any significant changes in thoughts, feelings or behavior since previous check in.  Emily Park presented for session on time and was alert, oriented x5, with no evidence or self-report of active SI/HI or A/V H.  Emily Park reported compliance with medication and denied use of alcohol or illicit substances.  Emily Park reported scores of 6/10 for depression, 6/10 for anxiety, and 6/10 for anger/irritability.  Emily Park denied any recent panic attacks.  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  Emily Park reported that a recent struggle was getting into an argument with her husband yesterday when she tried to offer helpful advice, and he got upset.  Emily Park reported that her goal today is to do some cleaning around the home since its expected to rain.       Second Therapeutic Activity: Psycho-educational  portion of group was provided by [name], Interior and spatial designer of community education with [employer].  [name] provided information on history of her local agency, mission statement, and the variety of unique services offered which group members might find beneficial to engage in, including both virtual and in-person support groups, as well as peer support program for mentoring.  [name] offered time to answer member's questions regarding services and encouraged them to consider utilizing these services to assist in working towards their individual wellness goals.  Intervention effectiveness could not be measured, as client did not participate.    Third Therapeutic Activity: Counselor introduced topic of stress management today.  Counselor provided definition of stress as feeling tense, overwhelmed, worn out, and/or exhausted, and noted that in small amounts, stress can be motivating until things become too overwhelming to manage.  Counselor also explained how stress can be acute (brief but intense) or chronic (long-lasting) and this can impact the severity of symptoms one can experience in the physical, emotional, and behavioral categories.  Counselor inquired about members' specific stressors, how long they have been prevalent, and the various symptoms that tend to manifest as a result.  Counselor also offered several stress management strategies to help improve members' coping ability, including journaling, gratitude practice, relaxation techniques, and time management tips.  Counselor also explained that research has shown a strong support network composed of trusted family, friends, or community members can increase resilience in times of stress, and inquired about who members can reach out to for help in managing stressors.  Counselor encouraged members to consider discussing stressor 'red flags' with their close supports that can be monitored and strategies for assisting  them in times of crisis.   Intervention was effective, as evidenced by Emily Park actively participating in discussion on subject, reporting that her most significant stressors include staying healthy, financial worries, and family conflict.  [name] was able to identify several warning signs related to stress, including teeth grinding, isolation, increased heartrate, forgetfulness, and crying spells.  Emily Park reported that her stress management goal is to return to work in order to address money worries, and set healthier boundaries with toxic people that stress her out.  Emily Park also expressed receptiveness to several stress management strategies practiced today in session, including practicing deep breathing exercise, maintaining a stress tracker in her journal to build insight into stressors, and practicing progressive muscle relaxation exercise.    Assessment and Plan: Counselor recommends that [name] remain in IOP treatment to better manage mental health symptoms, ensure stability and pursue completion of treatment plan goals. Counselor recommends adherence to crisis/safety plan, taking medications as prescribed, and following up with medical professionals if any issues arise.    Follow Up Instructions: Counselor will send Webex link for session tomorrow.  Emily Park was advised to call back or seek an in-person evaluation if the symptoms worsen or if the condition fails to improve as anticipated.   Collaboration of Care:   Medication Management AEB Dr. Eliseo Gum or Hillery Jacks, NP                                          Case Manager AEB Jeri Modena, CNA   Patient/Guardian was advised Release of Information must be obtained prior to any record release in order to collaborate their care with an outside provider. Patient/Guardian was advised if they have not already done so to contact the registration department to sign all necessary forms in order for [name] to release information regarding their care.   Consent: Patient/Guardian gives verbal  consent for treatment and assignment of benefits for services provided during this visit. Patient/Guardian expressed understanding and agreed to proceed.  I provided 180 minutes of non-face-to-face time during this encounter.   [name], [place], LCAS 02/22/23

## 2023-02-23 ENCOUNTER — Other Ambulatory Visit (HOSPITAL_COMMUNITY): Payer: Commercial Managed Care - PPO | Admitting: Psychiatry

## 2023-02-23 DIAGNOSIS — F411 Generalized anxiety disorder: Secondary | ICD-10-CM

## 2023-02-23 DIAGNOSIS — F332 Major depressive disorder, recurrent severe without psychotic features: Secondary | ICD-10-CM

## 2023-02-23 NOTE — Progress Notes (Signed)
Virtual Visit via Video Note   I connected with Emily Park on 02/23/23 at  9:00 AM EDT by a video enabled telemedicine application and verified that I am speaking with the correct person using two identifiers.   At orientation to the IOP program, Case Manager discussed the limitations of evaluation and management by telemedicine and the availability of in person appointments. The patient expressed understanding and agreed to proceed with virtual visits throughout the duration of the program.   Location:  Patient: Patient Home Provider: Clinical Home Office   History of Present Illness: MDD and GAD  Observations/Objective: Check In: Case Manager checked in with all participants to review discharge dates, insurance authorizations, work-related documents and needs from the treatment team regarding medications. Emily Park stated needs and engaged in discussion.    Initial Therapeutic Activity: Counselor facilitated a check-in with Emily Park to assess for safety, sobriety and medication compliance.  Counselor also inquired about Emily Park's current emotional ratings, as well as any significant changes in thoughts, feelings or behavior since previous check in.  Emily Park presented for session on time and was alert, oriented x5, with no evidence or self-report of active SI/HI or A/V H.  Alayjah reported compliance with medication and denied use of alcohol or illicit substances.  Emily Park reported scores of 3/10 for depression, 4/10 for anxiety, and 0/10 for anger/irritability.  Emily Park denied any recent outbursts or panic attacks.  Emily Park reported that a recent success was going out to lunch, cleaning up the kitchen, and vacuuming around her home yesterday.  Emily Park denied any new struggles, reporting that she and her husband were able to get along better in the evening with no arguments and he did agree they should pursue marriage counseling.  Emily Park reported that her goal this weekend is to visit her father, who has been dealing with  health challenges and could use some support.       Second Therapeutic Activity: Counselor covered topic of distress tolerance skills today.  Counselor utilized a DBT handout which explained how distressing situations don't always have quick solutions, so the only choice is to sit with uncomfortable emotions until they pass.  Counselor offered the IMPROVE acronym as a solution to this problem, which outlined various skills (i.e. Imagery, Meaning, Prayer, Relaxation, 'One thing in the moment', Vacation, and Encouragement) that could be explored in order to improve ability to tolerate discomfort.  Counselor tasked members with identifying personalized strategies for each category which could have been implemented to handle a recent challenge more effectively.  Intervention was effective, as evidenced by Emily Park actively engaging in discussion on subject, reporting that dealing with family conflict can be distressing for her.  Emily Park was able to identify several strategies for handling a similar struggle in the future, including visualizing herself relaxing at the beach, recognizing that dealing with conflict well could strengthen the relationship and lead to greater understanding with her partner, saying a prayer to her higher power, engaging in deep breathing to avoid outbursts, running a warm bath to increase sense of relaxation, calling a supportive friend to make plans, listening to classical or smooth jazz music, cleaning up around the home or folding clothes, playing with Play-Doh, or reciting a memorable quote that gives her strength.  Emily Park stated "Breathe, and allow things to pass".  Assessment and Plan: Counselor recommends that Emily Park remain in IOP treatment to better manage mental health symptoms, ensure stability and pursue completion of treatment plan goals. Counselor recommends adherence to crisis/safety plan, taking medications as  prescribed, and following up with medical professionals if any issues  arise.    Follow Up Instructions: Counselor will send Webex link for session tomorrow.  Emily Park was advised to call back or seek an in-person evaluation if the symptoms worsen or if the condition fails to improve as anticipated.   Collaboration of Care:   Medication Management AEB Dr. Eliseo Gum or Hillery Jacks, NP                                          Case Manager AEB Jeri Modena, CNA   Patient/Guardian was advised Release of Information must be obtained prior to any record release in order to collaborate their care with an outside provider. Patient/Guardian was advised if they have not already done so to contact the registration department to sign all necessary forms in order for [name] to release information regarding their care.   Consent: Patient/Guardian gives verbal consent for treatment and assignment of benefits for services provided during this visit. Patient/Guardian expressed understanding and agreed to proceed.  I provided 165 minutes of non-face-to-face time during this encounter.   [name], [place], LCAS 02/23/23

## 2023-02-26 ENCOUNTER — Other Ambulatory Visit (HOSPITAL_COMMUNITY): Payer: Commercial Managed Care - PPO | Attending: Psychiatry | Admitting: Licensed Clinical Social Worker

## 2023-02-26 DIAGNOSIS — F411 Generalized anxiety disorder: Secondary | ICD-10-CM | POA: Diagnosis not present

## 2023-02-26 DIAGNOSIS — F332 Major depressive disorder, recurrent severe without psychotic features: Secondary | ICD-10-CM | POA: Diagnosis present

## 2023-02-26 NOTE — Psych (Signed)
Virtual Visit via Video Note  I connected with Emily Park on 01/31/23 at  9:00 AM EDT by a video enabled telemedicine application and verified that I am speaking with the correct person using two identifiers.  Location: Patient: patient home Provider: clinical home office   I discussed the limitations of evaluation and management by telemedicine and the availability of in person appointments. The patient expressed understanding and agreed to proceed.  I discussed the assessment and treatment plan with the patient. The patient was provided an opportunity to ask questions and all were answered. The patient agreed with the plan and demonstrated an understanding of the instructions.   The patient was advised to call back or seek an in-person evaluation if the symptoms worsen or if the condition fails to improve as anticipated.  Pt was provided 240 minutes of non-face-to-face time during this encounter.   Donia Guiles, LCSW   [facility] PHP THERAPIST PROGRESS NOTE  Emily Park [id number]  Session Time: 9:00 - 10:00  Participation Level: Active  Behavioral Response: CasualAlertDepressed  Type of Therapy: Group Therapy  Treatment Goals addressed: Coping  Progress Towards Goals: Progressing  Interventions: CBT, DBT, Supportive, and Reframing  Summary: Emily Park is a 52 y.o. female who presents with depression and anxiety symptoms.  Clinician led check-in regarding current stressors and situation, and review of patient completed daily inventory. Clinician utilized active listening and empathetic response and validated patient emotions. Clinician facilitated processing group on pertinent issues.?    Therapist Response: Patient arrived within time allowed. Patient rates her mood at a 8 on a scale of 1-10 with 10 being best. Pt states she feels "pretty good." Pt states she slept 8.5 hours and ate 2x. Pt reports she had a GI appt yesterday and it went well. Pt states she took  her dog for a walk and is working to increase her water intake. Pt reports trying to get back into knitting and watching tv pastors to motivate her. Pt demonstrates increased forward thinking, however continues to struggle with self-confidence. Patient able to process. Patient engaged in discussion.          Session Time: 10:00 am - 11:00 am   Participation Level: Active   Behavioral Response: CasualAlertDepressed   Type of Therapy: Group Therapy   Treatment Goals addressed: Coping   Progress Towards Goals: Progressing   Interventions: CBT, DBT, Solution Focused, Strength-based, Supportive, and Reframing   Therapist Response: Cln led processing group for pt's current struggles. Group members shared stressors and provided support and feedback. Cln brought in topics of boundaries, healthy relationships, and unhealthy thought processes to inform discussion.    Therapist Response: Pt able to process and provide support to group.          Session Time: 11:00 -12:00   Participation Level: Active   Behavioral Response: CasualAlertDepressed   Type of Therapy: Group Therapy, Spiritual Care   Treatment Goals addressed: Coping   Progress Towards Goals: Progressing   Interventions: Supportive, Education   Summary:  Emily Park, Chaplain, led group.   Therapist Response: Pt participated       Session Time: 12:00 -1:00   Participation Level: Active   Behavioral Response: CasualAlertDepressed   Type of Therapy: Group therapy, Occupational Therapy   Treatment Goals addressed: Coping   Progress Towards Goals: Progressing   Interventions: Supportive; Psychoeducation   Summary: 12:00 - 12:50: Occupational Therapy group led by cln E. Hollan. 12:50 - 1:00 Clinician assessed for immediate needs, medication  compliance and efficacy, and safety concerns.   Therapist Response: 12:00 - 12:50: See OT note 12:50 - 1:00 pm: At check-out, patient reports no immediate concerns.  Patient demonstrates progress as evidenced by continued engagement and responsiveness to treatment. Patient denies SI/HI/self-harm thoughts at the end of group.    Suicidal/Homicidal: Nowithout intent/plan   Plan: Pt will continue in PHP while working to stabilize post-hospitalization, decrease depression and anxiety symptoms, and increase ability to manage symptoms in a healthy manner.   Collaboration of Care: Medication Management AEB J. McQuilla  Patient/Guardian was advised Release of Information must be obtained prior to any record release in order to collaborate their care with an outside provider. Patient/Guardian was advised if they have not already done so to contact the registration department to sign all necessary forms in order for [name] to release information regarding their care.   Consent: Patient/Guardian gives verbal consent for treatment and assignment of benefits for services provided during this visit. Patient/Guardian expressed understanding and agreed to proceed.   Diagnosis: MDD (major depressive disorder), recurrent severe, without psychosis (HCC) [F33.2]    1. MDD (major depressive disorder), recurrent severe, without psychosis (HCC)   2. Generalized anxiety disorder       Donia Guiles, LCSW

## 2023-02-26 NOTE — Progress Notes (Signed)
Virtual Visit via Video Note   I connected with Emily Park. Germain on 02/26/23 at  9:00 AM EDT by a video enabled telemedicine application and verified that I am speaking with the correct person using two identifiers.   At orientation to the IOP program, Case Manager discussed the limitations of evaluation and management by telemedicine and the availability of in person appointments. The patient expressed understanding and agreed to proceed with virtual visits throughout the duration of the program.   Location:  Patient: Patient Home Provider: OPT BH Office   History of Present Illness: MDD and GAD  Observations/Objective: Check In: Case Manager checked in with all participants to review discharge dates, insurance authorizations, work-related documents and needs from the treatment team regarding medications. Zavanna stated needs and engaged in discussion.    Initial Therapeutic Activity: Counselor facilitated a check-in with Rasheeta to assess for safety, sobriety and medication compliance.  Counselor also inquired about Jodell's current emotional ratings, as well as any significant changes in thoughts, feelings or behavior since previous check in.  Asja presented for session on time and was alert, oriented x5, with no evidence or self-report of active SI/HI or A/V H.  Dawnn reported compliance with medication and denied use of alcohol or illicit substances.  Ahuva reported scores of 3/10 for depression, 4/10 for anxiety, and 0/10 for anger/irritability.  Sheniya denied any recent outbursts or panic attacks.  Norris reported that a recent success was visiting her father over the weekend for a few hours.  Jayauna reported that a recent struggle was experiencing some stomach pain on Sunday, which interfered with her plan to attend church.  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       Second Therapeutic Activity: Counselor introduced topic  of self-esteem today and defined this as the value an individual places on oneself, based upon assessment of personal worth as a human being and approval/disapproval of one's behavior. Counselor asked members to assess their level of self-esteem at this time based upon common indicators of high self-esteem, including: accepting oneself unconditionally;  having self-respect and deep seated belief that one matters; being unaffected by other people's opinions/criticisms; and showing good control over emotions.  Counselor also explained concept of one's inner critic which serves to highlight faults and minimize strengths, directly influencing low sense of self-esteem.  Counselor then provided handout on 'strengths and qualities', which featured questions to guide discussion and increase awareness of each member's unique individual abilities which could reinforce higher self-esteem. Examples of questions included: 'things I am good at', 'challenges I have overcome', and 'what I like about myself'.  Intervention was effective, as evidenced by [name] actively engaging in discussion on topic, and completing a self-esteem assessment, receiving a score of 8, which indicated a 'negative' level of self-esteem at this time due to traits such as being passive during interactions with others, fearing rejection from other people, and putting down her own abilities or accomplishments.  Chanaya stated "I've always kind of felt like I had low self-esteem".  Alynn was receptive to several strategies offered today for increasing self-esteem during treatment, including making positive changes to her self-care routine such as including more exercise, putting pictures of her kids around the home to reinforce accomplishment as a providing mother, asking for feedback from positive supports in her network like her step mother, and recognizing personal strengths such as love, kindness, empathy, spirituality, and  forgiveness.  Assessment and  Plan: Counselor recommends that Rewa remain in IOP treatment to better manage mental health symptoms, ensure stability and pursue completion of treatment plan goals. Counselor recommends adherence to crisis/safety plan, taking medications as prescribed, and following up with medical professionals if any issues arise.    Follow Up Instructions: Counselor will send Webex link for session tomorrow.  Nkenge was advised to call back or seek an in-person evaluation if the symptoms worsen or if the condition fails to improve as anticipated.   Collaboration of Care:   Medication Management AEB Dr. Eliseo Gum or Hillery Jacks, NP                                          Case Manager AEB Jeri Modena, CNA   Patient/Guardian was advised Release of Information must be obtained prior to any record release in order to collaborate their care with an outside provider. Patient/Guardian was advised if they have not already done so to contact the registration department to sign all necessary forms in order for [name] to release information regarding their care.   Consent: Patient/Guardian gives verbal consent for treatment and assignment of benefits for services provided during this visit. Patient/Guardian expressed understanding and agreed to proceed.  I provided 165 minutes of non-face-to-face time during this encounter.   [name], LCSW, LCAS 02/26/23

## 2023-02-27 ENCOUNTER — Encounter (HOSPITAL_COMMUNITY): Payer: Self-pay | Admitting: Psychiatry

## 2023-02-27 ENCOUNTER — Other Ambulatory Visit (HOSPITAL_COMMUNITY): Payer: Commercial Managed Care - PPO | Admitting: Licensed Clinical Social Worker

## 2023-02-27 DIAGNOSIS — F411 Generalized anxiety disorder: Secondary | ICD-10-CM

## 2023-02-27 DIAGNOSIS — F332 Major depressive disorder, recurrent severe without psychotic features: Secondary | ICD-10-CM | POA: Diagnosis not present

## 2023-02-27 MED ORDER — DOXEPIN HCL 75 MG PO CAPS: 75.0000 mg | ORAL_CAPSULE | Freq: Every day | ORAL | 0 refills | Status: DC

## 2023-02-27 NOTE — Psych (Signed)
Virtual Visit via Video Note  I connected with Emily Park on 02/02/23 at  9:00 AM EDT by a video enabled telemedicine application and verified that I am speaking with the correct person using two identifiers.  Location: Patient: patient home Provider: clinical home office   I discussed the limitations of evaluation and management by telemedicine and the availability of in person appointments. The patient expressed understanding and agreed to proceed.  I discussed the assessment and treatment plan with the patient. The patient was provided an opportunity to ask questions and all were answered. The patient agreed with the plan and demonstrated an understanding of the instructions.   The patient was advised to call back or seek an in-person evaluation if the symptoms worsen or if the condition fails to improve as anticipated.  Pt was provided 240 minutes of non-face-to-face time during this encounter.   Donia Guiles, LCSW   [facility] PHP THERAPIST PROGRESS NOTE  ZENNA SPILLER [id number]  Session Time: 9:00 - 10:00  Participation Level: Active  Behavioral Response: CasualAlertDepressed  Type of Therapy: Group Therapy  Treatment Goals addressed: Coping  Progress Towards Goals: Progressing  Interventions: CBT, DBT, Supportive, and Reframing  Summary: Emily Park is a 52 y.o. female who presents with depression and anxiety symptoms.  Clinician led check-in regarding current stressors and situation, and review of patient completed daily inventory. Clinician utilized active listening and empathetic response and validated patient emotions. Clinician facilitated processing group on pertinent issues.?    Therapist Response: Patient arrived within time allowed. Patient rates her mood at a 6 on a scale of 1-10 with 10 being best. Pt states she feels "bad physically." Pt states she slept 5 hours and ate 0x. Pt reports a GI flare up yesterday and struggling with nausea, vomiting,  and keeping food down. Pt reports she stayed in bed most of the day with her dog watching tv. Pt reports feeling "ok" mentally due to not having the energy to consider anything but her physical symptoms. Patient able to process. Patient engaged in discussion.          Session Time: 10:00 am - 11:00 am   Participation Level: Active   Behavioral Response: CasualAlertDepressed   Type of Therapy: Group Therapy   Treatment Goals addressed: Coping   Progress Towards Goals: Progressing   Interventions: CBT, DBT, Solution Focused, Strength-based, Supportive, and Reframing   Therapist Response: Cln led discussion on ways to manage stressors and feelings over the weekend. Group members  brainstormed things to do over the weekend for multiple levels of energy, access, and moods. Cln reviewed crisis services should they be needed and provided pt's with the text crisis line, mobile crisis, national suicide hotline, [facility] 24/7 line, and information on [facility].      Therapist Response: Pt engaged in discussion and is able to identify 3 ideas of what to do over the weekend to keep their mind engaged.        Session Time: 11:00 -12:00   Participation Level: Active   Behavioral Response: CasualAlertDepressed   Type of Therapy: Group Therapy   Treatment Goals addressed: Coping   Progress Towards Goals: Progressing   Interventions: CBT, DBT, Solution Focused, Strength-based, Supportive, and Reframing   Summary: Cln led discussion on CBT thinking error: mind reading. Cln worked with group members to identify examples of mind reading and the consequences that can come. Group shared ways in which mind reading has been an issue for them and  barriers to working on it.    Therapist Response:   Pt engaged in discussion and reports understanding of mind reading.       Session Time: 12:00 -1:00   Participation Level: Active   Behavioral Response: CasualAlertDepressed   Type of Therapy:  Group therapy, Occupational Therapy   Treatment Goals addressed: Coping   Progress Towards Goals: Progressing   Interventions: Supportive; Psychoeducation   Summary: 12:00 - 12:50: Occupational Therapy group led by cln E. Hollan. 12:50 - 1:00 Clinician assessed for immediate needs, medication compliance and efficacy, and safety concerns.   Therapist Response: 12:00 - 12:50: See OT note 12:50 - 1:00 pm: At check-out, patient reports no immediate concerns. Patient demonstrates progress as evidenced by continued engagement and responsiveness to treatment. Patient denies SI/HI/self-harm thoughts at the end of group.    Suicidal/Homicidal: Nowithout intent/plan   Plan: Pt will continue in PHP while working to stabilize post-hospitalization, decrease depression and anxiety symptoms, and increase ability to manage symptoms in a healthy manner.   Collaboration of Care: Medication Management AEB J. McQuilla  Patient/Guardian was advised Release of Information must be obtained prior to any record release in order to collaborate their care with an outside provider. Patient/Guardian was advised if they have not already done so to contact the registration department to sign all necessary forms in order for [name] to release information regarding their care.   Consent: Patient/Guardian gives verbal consent for treatment and assignment of benefits for services provided during this visit. Patient/Guardian expressed understanding and agreed to proceed.   Diagnosis: MDD (major depressive disorder), recurrent severe, without psychosis (HCC) [F33.2]    1. MDD (major depressive disorder), recurrent severe, without psychosis (HCC)   2. Generalized anxiety disorder       Donia Guiles, LCSW

## 2023-02-27 NOTE — Progress Notes (Signed)
Virtual Visit via Video Note   I connected with Emily Park. Vohra on 02/27/23 at  9:00 AM EDT by a video enabled telemedicine application and verified that I am speaking with the correct person using two identifiers.   At orientation to the IOP program, Case Manager discussed the limitations of evaluation and management by telemedicine and the availability of in person appointments. The patient expressed understanding and agreed to proceed with virtual visits throughout the duration of the program.   Location:  Patient: Patient Home Provider: [facility]   History of Present Illness: MDD and GAD  Observations/Objective: Check In: Case Manager checked in with all participants to review discharge dates, insurance authorizations, work-related documents and needs from the treatment team regarding medications. Melonie stated needs and engaged in discussion.    Initial Therapeutic Activity: Counselor facilitated a check-in with Emily Park to assess for safety, sobriety and medication compliance.  Counselor also inquired about Emily Park's current emotional ratings, as well as any significant changes in thoughts, feelings or behavior since previous check in.  Emily Park presented for session on time and was alert, oriented x5, with no evidence or self-report of active SI/HI or A/V H.  Emily Park reported compliance with medication and denied use of alcohol or illicit substances.  Emily Park reported scores of 3/10 for depression, 4/10 for anxiety, and 0/10 for anger/irritability.  Emily Park denied any recent outbursts or panic attacks.  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  Emily Park reported that a recent struggle has been worrying about her husband's drinking, although she is hesitant to bring this up since he has anger problems.  Emily Park reported that her goal today is to visit the dentist and get some cavities filled.       Second Therapeutic Activity: Counselor  introduced [employer], [name] to provide psychoeducation on topic of Grief and Loss with members today.  [name] began discussion by checking in with the group about their baseline mood today, general thoughts on what grief means to them and how it has affected them personally in the past.  [name] provided information on how the process of grief/loss can differ depending upon one's unique culture, and categories of loss one could experience (i.e. loss of a person, animal, relationship, job, identity, etc).  [name] encouraged members to be mindful of how pervasive loss can be, and how to recognize signs which could indicate that this is having an impact on one's overall mental health and wellbeing.  Intervention was effective, as evidenced by Emily Park participating in discussion with speaker on the subject, reporting that this helped her realize how broad grief can be, since it led her to reflect on the loss she felt when her ex-husband left.  Emily Park stated "It was hard to overcome that.  Being with someone so long and having kids together".  She was receptive to empathetic feedback from the chaplain.      Third Therapeutic Activity: Counselor introduced topic of grounding skills today.  Counselor defined these as simple strategies one can use to help detach from difficult thoughts or feelings temporarily by focusing on something else.  Counselor noted that grounding will not solve the problem at hand, but can provide the practitioner with time to regain control over their thoughts and/or feelings and prevent the situation from getting worse (i.e. interrupting a panic attack).  Counselor divided these into three categories (mental, physical, and soothing) and then provided examples of  each which group members could practice during session.  Some of these included describing one's environment in detail or playing a categories game with oneself for mental category, taking a hot bath/shower, stretching, or  carrying a grounding object for physical category, and saying kind statements, or visualizing people one cares about for soothing category.  Counselor inquired about which techniques members have used with success in the past, or will commit to learning, practicing, and applying now to improve coping abilities.  Intervention was effective, as evidenced by Emily Park participating in discussion on the subject, trying out several of the techniques during session, and expressing interest in adding several to her available coping skills, such as playing a categories game involving listing dog breeds or zoo animals, visualizing herself relaxing at the beach in the water, reading an engrossing story, playing with her animals to make her laugh, using a 'fidget ring' as a grounding object, stretching, practicing deep breathing, telling herself "You're a good person and you'll get through this", looking back at meaningful gifts or cards her children have given her in the past, and scheduling a massage for a safe treat.    Assessment and Plan: Counselor recommends that Emily Park remain in IOP treatment to better manage mental health symptoms, ensure stability and pursue completion of treatment plan goals. Counselor recommends adherence to crisis/safety plan, taking medications as prescribed, and following up with medical professionals if any issues arise.    Follow Up Instructions: Counselor will send Webex link for session tomorrow.  Emily Park was advised to call back or seek an in-person evaluation if the symptoms worsen or if the condition fails to improve as anticipated.   Collaboration of Care:   Medication Management AEB Dr. Eliseo Gum or Hillery Jacks, NP                                          Case Manager AEB Jeri Modena, CNA   Patient/Guardian was advised Release of Information must be obtained prior to any record release in order to collaborate their care with an outside provider. Patient/Guardian was advised if they  have not already done so to contact the registration department to sign all necessary forms in order for [name] to release information regarding their care.   Consent: Patient/Guardian gives verbal consent for treatment and assignment of benefits for services provided during this visit. Patient/Guardian expressed understanding and agreed to proceed.  I provided 180 minutes of non-face-to-face time during this encounter.   [name], LCSW, LCAS 02/27/23

## 2023-02-27 NOTE — Psych (Signed)
Virtual Visit via Video Note  I connected with Emily Park on 02/05/23 at  9:00 AM EDT by a video enabled telemedicine application and verified that I am speaking with the correct person using two identifiers.  Location: Patient: patient home Provider: clinical home office   I discussed the limitations of evaluation and management by telemedicine and the availability of in person appointments. The patient expressed understanding and agreed to proceed.  I discussed the assessment and treatment plan with the patient. The patient was provided an opportunity to ask questions and all were answered. The patient agreed with the plan and demonstrated an understanding of the instructions.   The patient was advised to call back or seek an in-person evaluation if the symptoms worsen or if the condition fails to improve as anticipated.  Pt was provided 240 minutes of non-face-to-face time during this encounter.   Donia Guiles, LCSW   [facility] PHP THERAPIST PROGRESS NOTE  RANIYA WOOLARD [id number]  Session Time: 9:00 - 10:00  Participation Level: Active  Behavioral Response: CasualAlertDepressed  Type of Therapy: Group Therapy  Treatment Goals addressed: Coping  Progress Towards Goals: Progressing  Interventions: CBT, DBT, Supportive, and Reframing  Summary: Emily Park is a 52 y.o. female who presents with depression and anxiety symptoms.  Clinician led check-in regarding current stressors and situation, and review of patient completed daily inventory. Clinician utilized active listening and empathetic response and validated patient emotions. Clinician facilitated processing group on pertinent issues.?    Therapist Response: Patient arrived within time allowed. Patient rates her mood at a 8 on a scale of 1-10 with 10 being best. Pt states she feels "pretty good." Pt states she slept 7 hours and ate 2x. Pt reports she had a good weekend with her husband and they didn't have any  arguments and she didn't feel like she had to walk on eggshells. Pt reports doing some chores and attending church. Pt reports being bolstered by the positive mood of her husband.  Patient able to process. Patient engaged in discussion.          Session Time: 10:00 am - 11:00 am   Participation Level: Active   Behavioral Response: CasualAlertDepressed   Type of Therapy: Group Therapy   Treatment Goals addressed: Coping   Progress Towards Goals: Progressing   Interventions: CBT, DBT, Solution Focused, Strength-based, Supportive, and Reframing   Therapist Response: Cln led discussion on deep breathing and its therapeutic benefits, using DBT TIPP skills to inform discussion. Group practiced how to breathe from their diaphragms to ensure therapeutic quality and different ways to keep track of regulating breaths.       Therapist Response: Pt engaged in discussion and practice.        Session Time: 11:00 -12:00   Participation Level: Active   Behavioral Response: CasualAlertDepressed   Type of Therapy: Group Therapy   Treatment Goals addressed: Coping   Progress Towards Goals: Progressing   Interventions: CBT, DBT, Solution Focused, Strength-based, Supportive, and Reframing   Summary: Cln led discussion on saying "no." Group members shared struggles they have with saying no and how it affects them. Cln utilized boundaries, communication, and self-esteem tenets to inform discussion.   Therapist Response:  Pt engaged in discussion and reports increased understanding of how to say "no."         Session Time: 12:00 -1:00   Participation Level: Active   Behavioral Response: CasualAlertDepressed   Type of Therapy: Group therapy, Occupational Therapy  Treatment Goals addressed: Coping   Progress Towards Goals: Progressing   Interventions: Supportive; Psychoeducation   Summary: 12:00 - 12:50: Occupational Therapy group led by cln E. Hollan. 12:50 - 1:00 Clinician  assessed for immediate needs, medication compliance and efficacy, and safety concerns.   Therapist Response: 12:00 - 12:50: See OT note 12:50 - 1:00 pm: At check-out, patient reports no immediate concerns. Patient demonstrates progress as evidenced by continued engagement and responsiveness to treatment. Patient denies SI/HI/self-harm thoughts at the end of group.    Suicidal/Homicidal: Nowithout intent/plan   Plan: Pt will continue in PHP while working to stabilize post-hospitalization, decrease depression and anxiety symptoms, and increase ability to manage symptoms in a healthy manner.   Collaboration of Care: Medication Management AEB J. McQuilla  Patient/Guardian was advised Release of Information must be obtained prior to any record release in order to collaborate their care with an outside provider. Patient/Guardian was advised if they have not already done so to contact the registration department to sign all necessary forms in order for [name] to release information regarding their care.   Consent: Patient/Guardian gives verbal consent for treatment and assignment of benefits for services provided during this visit. Patient/Guardian expressed understanding and agreed to proceed.   Diagnosis: MDD (major depressive disorder), recurrent severe, without psychosis (HCC) [F33.2]    1. MDD (major depressive disorder), recurrent severe, without psychosis (HCC)   2. Migraine without status migrainosus, not intractable, unspecified migraine type   3. Generalized anxiety disorder       Donia Guiles, LCSW

## 2023-02-27 NOTE — Progress Notes (Signed)
Medication was refilled.

## 2023-02-28 ENCOUNTER — Other Ambulatory Visit (HOSPITAL_COMMUNITY): Payer: Commercial Managed Care - PPO | Admitting: Psychiatry

## 2023-02-28 ENCOUNTER — Telehealth (HOSPITAL_COMMUNITY): Payer: Self-pay | Admitting: Psychiatry

## 2023-02-28 DIAGNOSIS — F332 Major depressive disorder, recurrent severe without psychotic features: Secondary | ICD-10-CM

## 2023-02-28 DIAGNOSIS — F411 Generalized anxiety disorder: Secondary | ICD-10-CM

## 2023-02-28 NOTE — Telephone Encounter (Signed)
D:  Pt's insurance company previously authorized ten days for her to be in virtual MH-IOP (02-08-23 thru 02-21-23).  On 02-23-23 MH-IOP Case Mgr called for an extension and to request for five more days.  A:  Was informed that the new auth was extended thru 03-06-23 and clinical needed to be faxed to [phone]. Placed call today @ 1000, spoke to Wilmette R re: authorization.  According to Judeth Cornfield the Berkley Harvey is still pending.  She said [facility] has a turnaround of 15 days.  Call [phone].  Placed call to inform pt that that the last five days are still pending, so it is her choice to continue attending, but it is highly likely that MH-IOP won't know the status until after pt is d/c'd on 03-06-23.  Inform treatment team.

## 2023-02-28 NOTE — Progress Notes (Signed)
Virtual Visit via Video Note   I connected with Emily Park. Emily Park on 02/28/23 at  9:00 AM EDT by a video enabled telemedicine application and verified that I am speaking with the correct person using two identifiers.   At orientation to the IOP program, Case Manager discussed the limitations of evaluation and management by telemedicine and the availability of in person appointments. The patient expressed understanding and agreed to proceed with virtual visits throughout the duration of the program.   Location:  Patient: Patient Home Provider: [facility]   History of Present Illness: MDD and GAD  Observations/Objective: Check In: Case Manager checked in with all participants to review discharge dates, insurance authorizations, work-related documents and needs from the treatment team regarding medications. Emily Park stated needs and engaged in discussion.    Initial Therapeutic Activity: Counselor facilitated a check-in with Emily Park to assess for safety, sobriety and medication compliance.  Counselor also inquired about Emily Park's current emotional ratings, as well as any significant changes in thoughts, feelings or behavior since previous check in.  Emily Park presented for session on time and was alert, oriented x5, with no evidence or self-report of active SI/HI or A/V H.  Emily Park reported compliance with medication and denied use of alcohol or illicit substances.  Emily Park reported scores of 3/10 for depression, 3/10 for anxiety, and 0/10 for anger/irritability.  Emily Park denied any recent outbursts or panic attacks.  Emily Park reported that a recent success was attending a dentist appointment yesterday, which went well.  She reported that she also walked 2 miles for exercise.  Emily Park denied any new struggles.  Emily Park reported that her goal today is to go to the pool with a family member depending on how hot it is.         Second Therapeutic Activity: Counselor introduced Emily Park, [employer] Pharmacist, to provide psychoeducation on  topic of medication compliance with members today.  Emily Park provided psychoeducation on classes of medications such as antidepressants, antipsychotics, what symptoms they are intended to treat, and any side effects one might encounter while on a particular prescription.  Time was allowed for clients to ask any questions they might have of Emily Park regarding this specialty.  Intervention was effective, as evidenced by Emily Park participating in discussion with speaker on the subject, reporting that she has been prescribed doxepin, and worried about potential side effects that could worsen her digestive issues.  Emily Park was receptive to feedback from pharmacist regarding benefits and costs of taking this medication.   Third Therapeutic Activity: Counselor provided psychoeducation on subject of boundaries with group members today using a virtual handout.  This handout defined boundaries as the limits and rules that we set for ourselves within relationships, and featured a breakdown of the 3 common categories of boundaries (i.e. porous, rigid, and healthy), along with typical traits specific to each one for easy identification.  It was noted that most people have a mixture of different boundary types depending on setting, person, and culture.  Additional information was provided on the types of boundaries (i.e. physical, intellectual, emotional, sexual, material, and time) within relationships, and what could be considered healthy versus unhealthy. Counselor tasked members with identifying what types of boundaries they presently hold within her own support systems, the collective impact these boundaries have upon their mental health, and changes that could be made in order to more effectively communicate individual mental health needs.  Intervention was effective, as evidenced by Emily Park actively engaging in discussion on topic, reporting that she has porous  boundaries due to traits such as having trouble saying "no" to others  requests, being accepting of abuse and disrespect, and fearing rejection if she doesn't comply with others.  She noted that she is also unlikely to ask for help, even when she is struggling.  Nuria reported that she would work to improve boundaries by continuing to develop her assertive communication skills through therapy, as she hopes this will allow her to speak up to family members more confident and let them know what she is, and is not comfortable doing for them.    Assessment and Plan: Counselor recommends that Sharree remain in IOP treatment to better manage mental health symptoms, ensure stability and pursue completion of treatment plan goals. Counselor recommends adherence to crisis/safety plan, taking medications as prescribed, and following up with medical professionals if any issues arise.    Follow Up Instructions: Counselor will send Webex link for session tomorrow.  Florabelle was advised to call back or seek an in-person evaluation if the symptoms worsen or if the condition fails to improve as anticipated.   Collaboration of Care:   Medication Management AEB Dr. Eliseo Gum or Hillery Jacks, NP                                          Case Manager AEB Jeri Modena, CNA   Patient/Guardian was advised Release of Information must be obtained prior to any record release in order to collaborate their care with an outside provider. Patient/Guardian was advised if they have not already done so to contact the registration department to sign all necessary forms in order for [name] to release information regarding their care.   Consent: Patient/Guardian gives verbal consent for treatment and assignment of benefits for services provided during this visit. Patient/Guardian expressed understanding and agreed to proceed.  I provided 180 minutes of non-face-to-face time during this encounter.   [name], LCSW, LCAS 02/28/23

## 2023-03-02 ENCOUNTER — Other Ambulatory Visit (HOSPITAL_COMMUNITY): Payer: Commercial Managed Care - PPO | Admitting: Licensed Clinical Social Worker

## 2023-03-02 DIAGNOSIS — F411 Generalized anxiety disorder: Secondary | ICD-10-CM

## 2023-03-02 DIAGNOSIS — F332 Major depressive disorder, recurrent severe without psychotic features: Secondary | ICD-10-CM

## 2023-03-02 NOTE — Progress Notes (Signed)
Virtual Visit via Video Note   I connected with Emily Park on 03/02/23 at  9:00 AM EDT by a video enabled telemedicine application and verified that I am speaking with the correct person using two identifiers.   At orientation to the IOP program, Case Manager discussed the limitations of evaluation and management by telemedicine and the availability of in person appointments. The patient expressed understanding and agreed to proceed with virtual visits throughout the duration of the program.   Location:  Patient: Patient Home Provider: Clinical Home Office   History of Present Illness: MDD and GAD  Observations/Objective: Check In: Case Manager checked in with all participants to review discharge dates, insurance authorizations, work-related documents and needs from the treatment team regarding medications. Emily Park stated needs and engaged in discussion.    Initial Therapeutic Activity: Counselor facilitated a check-in with Emily Park to assess for safety, sobriety and medication compliance.  Counselor also inquired about Emily Park's current emotional ratings, as well as any significant changes in thoughts, feelings or behavior since previous check in.  Emily Park presented for session on time and was alert, oriented x5, with no evidence or self-report of active SI/HI or A/V H.  Emily Park reported compliance with medication and denied use of alcohol or illicit substances.  Emily Park reported scores of 3/10 for depression, 6/10 for anxiety, and 6/10 for anger/irritability.  Emily Park denied any recent outbursts or panic attacks.  Emily Park reported that a recent success was enjoying the 4th of July holiday with some family members.  Emily Park reported that a recent struggle was having a disagreement with her sister, which may lead her to cancel plans this afternoon.  Emily Park reported that her goal today is to go to the [employer] with friends if she doesn't cancel plans.       Second Therapeutic Activity: Counselor continued  discussion upon topic of self-care today with group members.  Counselor virtually shared self-care assessment form with members via Webex to complete final sub-categories (i.e. social, spiritual, and professional).  Members were asked to rank their engagement in the activities listed for each dimension on a scale of 1-3, with 1 indicating 'Poor', 2 indicating 'Ok', and 3 indicating 'Well'.  Counselor invited members to share results of this assessment, and inquired about which areas of self-care they are doing well in, as well as areas that require attention, and how they plan to begin addressing this during treatment.  Intervention was effective, as evidenced by Emily Park successfully completing final sections of assessment and actively engaging in discussion on subject, reporting that she is excelling in areas such as spending time outside in nature, acting in accordance with morals and values, learning new things related to her profession, and improving her professional skills, but would benefit from focusing more on areas such as calling or writing people that live far away, meeting new people, asking others for help, doing enjoyable activities with other people, recognizing things that give meaning to her life; setting aside time for thought and reflection; participating in a cause that is important to her; taking time to appreciate art; saying "No" to excessive responsibilities, and maintaining a balance between personal and professional life.  Emily Park reported that she would work to improve self-care deficits by getting back in touch with one of her best friends from the past; joining [employer] to meet new supportive peers; going to [employer] or volunteering with the [place] [employer] to meet new people and improve physical exercise; engaging in gratitude practice more  often to recognize positives in her life; taking time early each morning to sit and meditate; looking into pottery or candle making classes in  order to make new forms of art; and developing assertive communication skills in order to set healthier boundaries at home and work.    Third Therapeutic Activity: Counselor provided demonstration of relaxation technique known as mindful breathing meditation to help members increase sense of calm, resiliency, and control.  Counselor guided members through process of getting comfortable, achieving a relaxed breathing rhythm, and focusing on this for several minutes, allowing troubling thoughts and feelings to come and go without rumination.  Counselor processed effectiveness of activity afterward in discussion with members, including how this impacted their mental state, whether it was difficult to stay focused, and if they plan to include it in self-care routine to improve day-to-day coping.  Intervention was effective, as evidenced by Emily Park participating in exercise, and reporting that she felt relaxed as a result, and did not experience too many distractions.  She reported that she would add this to her self-care routine in order to stay more calm and grounded throughout the day.    Assessment and Plan: Counselor recommends that [name] remain in IOP treatment to better manage mental health symptoms, ensure stability and pursue completion of treatment plan goals. Counselor recommends adherence to crisis/safety plan, taking medications as prescribed, and following up with medical professionals if any issues arise.    Follow Up Instructions: Counselor will send Webex link for session tomorrow.  Emily Park was advised to call back or seek an in-person evaluation if the symptoms worsen or if the condition fails to improve as anticipated.   Collaboration of Care:   Medication Management AEB Dr. Eliseo Gum or Hillery Jacks, NP                                          Case Manager AEB Jeri Modena, CNA   Patient/Guardian was advised Release of Information must be obtained prior to any record release in order to  collaborate their care with an outside provider. Patient/Guardian was advised if they have not already done so to contact the registration department to sign all necessary forms in order for [name] to release information regarding their care.   Consent: Patient/Guardian gives verbal consent for treatment and assignment of benefits for services provided during this visit. Patient/Guardian expressed understanding and agreed to proceed.  I provided 145 minutes of non-face-to-face time during this encounter.   [name], LCSW, LCAS 03/02/23

## 2023-03-05 ENCOUNTER — Other Ambulatory Visit (HOSPITAL_COMMUNITY): Payer: Commercial Managed Care - PPO

## 2023-03-05 ENCOUNTER — Other Ambulatory Visit (HOSPITAL_COMMUNITY): Payer: Commercial Managed Care - PPO | Attending: Psychiatry | Admitting: Licensed Clinical Social Worker

## 2023-03-05 DIAGNOSIS — F332 Major depressive disorder, recurrent severe without psychotic features: Secondary | ICD-10-CM

## 2023-03-05 DIAGNOSIS — F411 Generalized anxiety disorder: Secondary | ICD-10-CM

## 2023-03-05 DIAGNOSIS — K589 Irritable bowel syndrome without diarrhea: Secondary | ICD-10-CM | POA: Insufficient documentation

## 2023-03-05 DIAGNOSIS — F329 Major depressive disorder, single episode, unspecified: Secondary | ICD-10-CM | POA: Insufficient documentation
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
## 2023-03-06 ENCOUNTER — Encounter (HOSPITAL_COMMUNITY): Payer: Self-pay | Admitting: Family

## 2023-03-06 ENCOUNTER — Other Ambulatory Visit (HOSPITAL_COMMUNITY): Payer: Commercial Managed Care - PPO | Admitting: Psychiatry

## 2023-03-06 ENCOUNTER — Other Ambulatory Visit (HOSPITAL_COMMUNITY): Payer: Commercial Managed Care - PPO

## 2023-03-06 DIAGNOSIS — F411 Generalized anxiety disorder: Secondary | ICD-10-CM

## 2023-03-06 DIAGNOSIS — F332 Major depressive disorder, recurrent severe without psychotic features: Secondary | ICD-10-CM

## 2023-03-06 DIAGNOSIS — F329 Major depressive disorder, single episode, unspecified: Secondary | ICD-10-CM | POA: Diagnosis not present

## 2023-03-06 MED ORDER — VORTIOXETINE HBR 20 MG PO TABS
20.0000 mg | ORAL_TABLET | Freq: Every day | ORAL | 0 refills | Status: DC
Start: 2023-03-06 — End: 2023-03-27

## 2023-03-06 NOTE — Progress Notes (Signed)
Virtual Visit via Video Note   I connected with Emily Park on 03/06/23 at  9:00 AM EDT by a video enabled telemedicine application and verified that I am speaking with the correct person using two identifiers.   At orientation to the IOP program, Case Manager discussed the limitations of evaluation and management by telemedicine and the availability of in person appointments. The patient expressed understanding and agreed to proceed with virtual visits throughout the duration of the program.   Location:  Patient: Patient Home Provider: [facility]   History of Present Illness: MDD and GAD  Observations/Objective: Check In: Case Manager checked in with all participants to review discharge dates, insurance authorizations, work-related documents and needs from the treatment team regarding medications. Emily Park stated needs and engaged in discussion.    Initial Therapeutic Activity: Counselor facilitated a check-in with Emily Park to assess for safety, sobriety and medication compliance.  Counselor also inquired about Emily Park's current emotional ratings, as well as any significant changes in thoughts, feelings or behavior since previous check in.  Emily Park presented for session on time and was alert, oriented x5, with no evidence or self-report of active SI/HI or A/V H.  Emily Park reported compliance with medication and denied use of alcohol or illicit substances.  Emily Park reported scores of 2/10 for depression, 5/10 for anxiety, and 0/10 for anger/irritability.  Emily Park denied any recent outbursts or panic attacks.  Emily Park reported that a recent success was reaching out to an old friend and making plans to hang out soon, in addition to taking a walk in the afternoon.  Emily Park denied any new struggles.  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       Second Therapeutic Activity: Counselor introduced [employer], [name] to provide psychoeducation on topic of  Grief and Loss with members today.  Emily Park began discussion by checking in with the group about their baseline mood today, general thoughts on what grief means to them and how it has affected them personally in the past.  Emily Park provided information on how the process of grief/loss can differ depending upon one's unique culture, and categories of loss one could experience (i.e. loss of a person, animal, relationship, job, identity, etc).  Emily Park encouraged members to be mindful of how pervasive loss can be, and how to recognize signs which could indicate that this is having an impact on one's overall mental health and wellbeing.  Intervention was effective, as evidenced by Emily Park participating in discussion with speaker on the subject, reporting that this discussion made her reflect upon the loss of a pet that was impactful, and the importance of establishing rituals or traditions to remember them each year in order to help cope with grief.    Third Therapeutic Activity: Counselor covered topic of attachment styles today.  Counselor virtually shared a handout with the group on this topic which defined attachment styles as how people think about and behave in relationships.  Styles were broken down by category, including secure attachment where one believes close relationships are trustworthy, compared to insecure attachment (i.e. anxious, avoidant, or anxious-avoidant) where one is distrusting or worries about their bond with others.  Counselor inquired about which attachment style members most related to, how this has influenced their mental health/well-being, and whether they intend to begin making any changes.  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  Emily Park reported that because she struggles to express her needs and wants, she often does everything in the household on her own, which is especially difficult when her physical health is poor.  Emily Park reported that she plans to become more assertive through therapy in order to speak her mind with supports like her husband, and set new boundaries that respect her need for self-care.  Emily Park left group early at 11:40am in order to attend an appointment on time, but informed care team ahead of time.    Assessment and Plan: Emily Park has completed MHIOP and will be discharged today.  Counselor recommends adherence to crisis/safety plan, taking medications as prescribed, and following up with medical professionals if any issues arise.    Follow Up Instructions: Emily Park was advised to call back or seek an in-person evaluation if the symptoms worsen or if the condition fails to improve as anticipated.   Collaboration of Care:   Medication Management AEB Dr. Eliseo Park or Emily Jacks, NP                                          Case Manager AEB Emily Modena, CNA   Patient/Guardian was advised Release of Information must be obtained prior to any record release in order to collaborate their care with an outside provider. Patient/Guardian was advised if they have not already done so to contact the registration department to sign all necessary forms in order for [name] to release information regarding their care.   Consent: Patient/Guardian gives verbal consent for treatment and assignment of benefits for services provided during this visit. Patient/Guardian expressed understanding and agreed to proceed.  I provided 160 minutes of non-face-to-face time during this encounter.   Emily Stain, LCSW, LCAS 03/06/23

## 2023-03-06 NOTE — Patient Instructions (Signed)
D:  Patient completed virtual MH-IOP today.  A:  Discharge today.  Follow up with Ambrose Mantle, LCSW on 03-12-23 @ 8 a.m (in person) and Dr. Mercy Riding on 03-27-23 @ 8 a.m (video).  Encouraged support groups through [facility] [phone].  R:  Patient receptive.

## 2023-03-06 NOTE — Progress Notes (Signed)
Virtual Visit via Video Note  I connected with Emily Park on 03/06/23 at  9:00 AM EDT by a video enabled telemedicine application and verified that I am speaking with the correct person using two identifiers.  Location: Patient: Home Provider: Office   I discussed the limitations of evaluation and management by telemedicine and the availability of in person appointments. The patient expressed understanding and agreed to proceed.  I discussed the assessment and treatment plan with the patient. The patient was provided an opportunity to ask questions and all were answered. The patient agreed with the plan and demonstrated an understanding of the instructions.   The patient was advised to call back or seek an in-person evaluation if the symptoms worsen or if the condition fails to improve as anticipated.  I provided 15 minutes of non-face-to-face time during this encounter.   Emily Rack, NP   [facility] Intensive Outpatient Program Discharge Summary  Emily Park [id number]  Admission date: 02/12/2023 Discharge date: 03/06/2023  Reason for admission: per HPI on discharge"  Emily Park is a 52 yo patient with a PPH of MDD who presents after dc from [facility] where she was Admitted after SA via exogenous insulin. She did require medical hospitalization for approx 5 days prior to transfer. She was being admitted to [facility] 01/16/2023 and IOP and 02/08/2023. PMH: Hx of septic shot 2 years ago, rectal prolapse, multiple stomach ulcers.   IBS, has had bowel surgery and struggles with going to the bathroom. Husband controls her medications and keeps them locked."  Progress in Program Toward Treatment Goals: Progressing, patient attended to participated with daily group session when active and engaged participation.  She is denying suicidal or homicidal ideations.  Denies auditory or visual hallucinations.  Reports overall the program was helpful to help with coping skills.  She is  currently rating her anxiety and depression 5 out of 10 with 10 being the worst.  States her depression has been really low over the past few weeks.  Progress (rationale): Chart reviewed medications refilled 02/27/2023  Collaboration of Care: Primary Care Provider AEB Mercy Riding and Psychiatrist AEB [facility] July 15 at 8 AM therapist  Patient/Guardian was advised Release of Information must be obtained prior to any record release in order to collaborate their care with an outside provider. Patient/Guardian was advised if they have not already done so to contact the registration department to sign all necessary forms in order for [name] to release information regarding their care.   Consent: Patient/Guardian gives verbal consent for treatment and assignment of benefits for services provided during this visit. Patient/Guardian expressed understanding and agreed to proceed.   Emily Rack, NP 03/06/2023

## 2023-03-06 NOTE — Progress Notes (Signed)
Virtual Visit via Video Note  I connected with Emily Park on @TODAY @ at  9:00 AM EDT by a video enabled telemedicine application and verified that I am speaking with the correct person using two identifiers.  Location: Patient: at home Provider: at office   I discussed the limitations of evaluation and management by telemedicine and the availability of in person appointments. The patient expressed understanding and agreed to proceed.  I discussed the assessment and treatment plan with the patient. The patient was provided an opportunity to ask questions and all were answered. The patient agreed with the plan and demonstrated an understanding of the instructions.   The patient was advised to call back or seek an in-person evaluation if the symptoms worsen or if the condition fails to improve as anticipated.  I provided 30 minutes of non-face-to-face time during this encounter.   Chestine Spore, RITA, M.Ed, CNA   Patient ID: Emily Park, female   DOB: Aug 02, 1971, 52 y.o.   MRN: [id number] D:  As per recent CCA states:  " Emily Park is a 52yo female referred to [facility] by [facility] following a suicide attempt and subsequent 7 day psych admission. She cites her stressors as her declining physical health and inability to work, as she previously worked as an [employer] fundraiser. Regarding ADLs, she endorses normal hygiene but decreased household tasks. She reports she has been seeing Dr. Milagros Evener for med man for 11 years and has been diagnosed with ADHD, anxiety, depression, and OCPD. She denies other treatment history and psych hospitalizations, previous suicide attempts, NSSI, substance abuse, HI, AVH, and current SI. She reports a great aunt and cousin died by suicide. She cites her husband, sons, sisters, parents, and husband's family as her supports. She currently lives with her husband and her 25yo son lives with them part time. She reports she had been taking her husband's Xanax and Gabapentin the week leading to  her hospitalization and since d/c the meds have been secured. She reports complex medical history to include recent GI ulcers, IBS, anemia, and hx of rectal prolapse. She states there are no firearms in her home.   Current Symptoms/Problems: sadness, hopelessness, worrying about the future, isolating, decreased appetite, lost 5lb in two months, decreased sleep (waking up throughout the night)"   Pt stepped down to virtual MH-IOP from virtual PHP today (02-08-23).  Reports PHP was very helpful in learning coping skills.  States she has put all her handouts in a notebook for future reference.  "I am so proud of myself because I am keeping busy.  I was able to cook and clean the whole house yesterday after taking my husband for a procedure."  On a scale of 1-10 (10 being the worst); pt rates her depression at a 5 and her anxiety at a 7.  Denies SI/HI or A/V hallucinations.  Pt completed virtual MH-IOP today.  She attended 15 days.  Pt is anxious about discharging today.  Reports group was very helpful.  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  On a scale of 1-10 (10 being the worst); pt rates her anxiety at a 5 and depression at a 2.  Denies SI/HI paranoia or A/V hallucinations.  A:  Discharge today.  F/U with Ambrose Mantle on 03-12-23 @ 8 a.m (in person)  and Dr. Mercy Riding on 03-27-23 @ 8 a.m (video).  Pt states she completed all the paperwork for IOP and Clinic.  She mailed it but the case mgr hasn't received it yet.  Informed pt of this.  Strongly encouraged support groups through [facility] [phone]. Pt was advised of ROI must be obtained prior to any records release in order to collaborate her care with an outside provider.  Pt was advised if she has not already done so to contact the front desk to sign all necessary forms in  order for MH-IOP to release info re: her care.  Consent:  Pt gives verbal consent for tx and assignment of benefits for services provided during this telehealth group process.  Pt expressed understanding and agreed to proceed. Collaboration of care:  Collaborate with Dr. Princess Bruins AEB, Hillery Jacks, NP, AEB; Ambrose Mantle, LCSW AEB and [name], LCSW AEB.  R:  Patient receptive.  Jeri Modena, M.Ed, CNA

## 2023-03-07 ENCOUNTER — Other Ambulatory Visit (HOSPITAL_COMMUNITY): Payer: Commercial Managed Care - PPO

## 2023-03-07 ENCOUNTER — Ambulatory Visit: Payer: Commercial Managed Care - PPO | Admitting: Internal Medicine

## 2023-03-08 ENCOUNTER — Other Ambulatory Visit (HOSPITAL_COMMUNITY): Payer: Commercial Managed Care - PPO

## 2023-03-09 ENCOUNTER — Other Ambulatory Visit (HOSPITAL_COMMUNITY): Payer: Commercial Managed Care - PPO

## 2023-03-12 ENCOUNTER — Ambulatory Visit (HOSPITAL_COMMUNITY): Payer: Commercial Managed Care - PPO | Admitting: Clinical

## 2023-03-12 ENCOUNTER — Other Ambulatory Visit (HOSPITAL_COMMUNITY): Payer: Commercial Managed Care - PPO

## 2023-03-13 ENCOUNTER — Other Ambulatory Visit (HOSPITAL_COMMUNITY): Payer: Commercial Managed Care - PPO

## 2023-03-14 ENCOUNTER — Other Ambulatory Visit (HOSPITAL_COMMUNITY): Payer: Commercial Managed Care - PPO

## 2023-03-14 NOTE — Progress Notes (Signed)
Virtual Visit via Video Note  I connected with Emily Park on 02/09/23 at  9:00 AM EDT by a video enabled telemedicine application and verified that I am speaking with the correct person using two identifiers.  Location: Patient: patient home Provider: clinical home office   I discussed the limitations of evaluation and management by telemedicine and the availability of in person appointments. The patient expressed understanding and agreed to proceed.  I discussed the assessment and treatment plan with the patient. The patient was provided an opportunity to ask questions and all were answered. The patient agreed with the plan and demonstrated an understanding of the instructions.   The patient was advised to call back or seek an in-person evaluation if the symptoms worsen or if the condition fails to improve as anticipated.  Pt was provided 180 minutes of non-face-to-face time during this encounter.   Emily Guiles, LCSW   Daily Group Progress Note  Program: IOP  Group Time: 9:00 - 10:00  Participation Level: Active  Behavioral Response: Appropriate and Sharing  Type of Therapy:  Group Therapy  Summary of Progress: Clinician led check-in regarding current stressors and situation, and review of patient completed daily inventory. Clinician utilized active listening and empathetic response and validated patient emotions. Clinician facilitated processing group on pertinent issues.?  Patient arrived within time allowed. Patient rates her mood at a 8 on a scale of 1-10 with 10 being best. Pt reports recent success as walking 1 mile yesterday. Pt reports recent struggle with managing negative thinking. Pt denies SI/HI.   Progress Towards Goals: Progressing   Group Time: 10:00 - 11:00   Participation Level:  Active  Behavioral Response: Appropriate and Sharing  Type of Therapy: Group Therapy  Summary of Progress: Cln introduced DBT distress tolerance skill IMPROVE.  Cln  discussed how this set of skills are for when you have to sit through an undesirable feeling and wait for it to pass. Group discussed how to apply the IMPROVE skills to decrease distress at the undesired feeling.  Pt engaged in discussion and reports understanding of topic.   Progress Towards Goals: Progressing   Group Time: 11:00 - 12:00  Participation Level:  Active  Behavioral Response: Appropriate and Sharing  Type of Therapy: Group Therapy  Summary of Progress: Chaplain, K Claussen, led group discussion on Strength. Group discussed ways they were taught to view strength and how that differs from the way they acknowledge/show strength now. Group worked to determine how to recognize the strength they hold.  Pt engaged in discussion and participated.   Progress Towards Goals: Progressing    Emily Guiles, LCSW

## 2023-03-15 ENCOUNTER — Other Ambulatory Visit (HOSPITAL_COMMUNITY): Payer: Commercial Managed Care - PPO

## 2023-03-16 ENCOUNTER — Telehealth (HOSPITAL_COMMUNITY): Payer: Self-pay | Admitting: Psychiatry

## 2023-03-16 ENCOUNTER — Other Ambulatory Visit (HOSPITAL_COMMUNITY): Payer: Commercial Managed Care - PPO

## 2023-03-16 ENCOUNTER — Other Ambulatory Visit (HOSPITAL_COMMUNITY): Payer: Self-pay | Admitting: Student in an Organized Health Care Education/Training Program

## 2023-03-16 ENCOUNTER — Encounter (HOSPITAL_COMMUNITY): Payer: Self-pay | Admitting: Student in an Organized Health Care Education/Training Program

## 2023-03-16 ENCOUNTER — Encounter: Payer: Self-pay | Admitting: Internal Medicine

## 2023-03-16 DIAGNOSIS — Z79899 Other long term (current) drug therapy: Secondary | ICD-10-CM

## 2023-03-16 DIAGNOSIS — G43909 Migraine, unspecified, not intractable, without status migrainosus: Secondary | ICD-10-CM

## 2023-03-16 MED ORDER — TOPIRAMATE 100 MG PO TABS
100.0000 mg | ORAL_TABLET | Freq: Every day | ORAL | 0 refills | Status: DC
Start: 2023-03-16 — End: 2023-03-20

## 2023-03-16 MED ORDER — CLONAZEPAM 0.5 MG PO TABS
0.5000 mg | ORAL_TABLET | Freq: Every day | ORAL | 0 refills | Status: DC
Start: 2023-03-16 — End: 2023-03-20

## 2023-03-16 MED ORDER — CLONAZEPAM 0.25 MG PO TBDP: 0.2500 mg | ORAL_TABLET | Freq: Every day | ORAL | 0 refills | Status: DC

## 2023-03-16 NOTE — Progress Notes (Signed)
Refilled Klonopin 0.5mg  tablets QHS and 0.25mg  QHS with only 11 day supply to bridge to next appt. Patient is has LTM BZD use and was interested in tapering down.   PGY-4 Eliseo Gum, MD

## 2023-03-16 NOTE — Telephone Encounter (Signed)
D:  Pt left vm that she is in need of refills (ie. Doxepin, Topomax, Clonazepam).  She would like the refills to be sent to [facility] on [address].  Pt has upcoming new pt appt with Dr. Mercy Riding on 03-27-23.  A:  Informed Dr. Morrie Sheldon and Tilford Pillar.

## 2023-03-19 ENCOUNTER — Other Ambulatory Visit: Payer: Self-pay | Admitting: Internal Medicine

## 2023-03-19 ENCOUNTER — Other Ambulatory Visit (HOSPITAL_COMMUNITY): Payer: Commercial Managed Care - PPO

## 2023-03-19 DIAGNOSIS — Z79899 Other long term (current) drug therapy: Secondary | ICD-10-CM

## 2023-03-19 DIAGNOSIS — G43909 Migraine, unspecified, not intractable, without status migrainosus: Secondary | ICD-10-CM

## 2023-03-20 ENCOUNTER — Other Ambulatory Visit (HOSPITAL_COMMUNITY): Payer: Commercial Managed Care - PPO

## 2023-03-20 ENCOUNTER — Encounter: Payer: Self-pay | Admitting: Internal Medicine

## 2023-03-20 ENCOUNTER — Ambulatory Visit: Payer: Commercial Managed Care - PPO | Admitting: Internal Medicine

## 2023-03-20 VITALS — BP 130/86 | HR 107 | Temp 98.3°F | Ht 64.0 in | Wt 120.0 lb

## 2023-03-20 DIAGNOSIS — D649 Anemia, unspecified: Secondary | ICD-10-CM

## 2023-03-20 DIAGNOSIS — F411 Generalized anxiety disorder: Secondary | ICD-10-CM

## 2023-03-20 DIAGNOSIS — N179 Acute kidney failure, unspecified: Secondary | ICD-10-CM | POA: Diagnosis not present

## 2023-03-20 DIAGNOSIS — G4700 Insomnia, unspecified: Secondary | ICD-10-CM | POA: Insufficient documentation

## 2023-03-20 DIAGNOSIS — F5101 Primary insomnia: Secondary | ICD-10-CM

## 2023-03-20 DIAGNOSIS — R682 Dry mouth, unspecified: Secondary | ICD-10-CM

## 2023-03-20 DIAGNOSIS — N182 Chronic kidney disease, stage 2 (mild): Secondary | ICD-10-CM

## 2023-03-20 DIAGNOSIS — G43909 Migraine, unspecified, not intractable, without status migrainosus: Secondary | ICD-10-CM

## 2023-03-20 DIAGNOSIS — N1831 Chronic kidney disease, stage 3a: Secondary | ICD-10-CM | POA: Diagnosis not present

## 2023-03-20 LAB — COMPREHENSIVE METABOLIC PANEL
ALT: 16 U/L (ref 0–35)
AST: 24 U/L (ref 0–37)
Albumin: 4.9 g/dL (ref 3.5–5.2)
Alkaline Phosphatase: 79 U/L (ref 39–117)
BUN: 21 mg/dL (ref 6–23)
CO2: 23 mEq/L (ref 19–32)
Calcium: 10.2 mg/dL (ref 8.4–10.5)
Chloride: 98 mEq/L (ref 96–112)
Creatinine, Ser: 1.35 mg/dL — ABNORMAL HIGH (ref 0.40–1.20)
GFR: 44.89 mL/min — ABNORMAL LOW (ref >60.00)
Glucose, Bld: 108 mg/dL — ABNORMAL HIGH (ref 70–99)
Potassium: 3.1 mEq/L — ABNORMAL LOW (ref 3.5–5.1)
Sodium: 131 mEq/L — ABNORMAL LOW (ref 135–145)
Total Bilirubin: 0.5 mg/dL (ref 0.2–1.2)
Total Protein: 8.7 g/dL — ABNORMAL HIGH (ref 6.0–8.3)

## 2023-03-20 LAB — CBC WITH DIFFERENTIAL/PLATELET
Basophils Absolute: 0.1 10*3/uL (ref 0.0–0.1)
Basophils Relative: 0.6 % (ref 0.0–3.0)
Eosinophils Absolute: 0.1 10*3/uL (ref 0.0–0.7)
Eosinophils Relative: 1.3 % (ref 0.0–5.0)
HCT: 44.9 % (ref 36.0–46.0)
Hemoglobin: 14.5 g/dL (ref 12.0–15.0)
Lymphocytes Relative: 29.8 % (ref 12.0–46.0)
Lymphs Abs: 2.9 10*3/uL (ref 0.7–4.0)
MCHC: 32.2 g/dL (ref 30.0–36.0)
MCV: 89.8 fl (ref 78.0–100.0)
Monocytes Absolute: 0.5 10*3/uL (ref 0.1–1.0)
Monocytes Relative: 5.4 % (ref 3.0–12.0)
Neutro Abs: 6.2 10*3/uL (ref 1.4–7.7)
Neutrophils Relative %: 62.9 % (ref 43.0–77.0)
Platelets: 408 10*3/uL — ABNORMAL HIGH (ref 150.0–400.0)
RBC: 5 Mil/uL (ref 3.87–5.11)
RDW: 14.7 % (ref 11.5–15.5)
WBC: 9.8 10*3/uL (ref 4.0–10.5)

## 2023-03-20 LAB — TSH: TSH: 0.18 u[IU]/mL — ABNORMAL LOW (ref 0.35–5.50)

## 2023-03-20 MED ORDER — HYDROXYZINE HCL 25 MG PO TABS: 25.0000 mg | ORAL_TABLET | Freq: Two times a day (BID) | ORAL | 0 refills | Status: DC | PRN

## 2023-03-20 MED ORDER — PREVIDENT 5000 BOOSTER PLUS 1.1 % DT PSTE: PASTE | DENTAL | 3 refills | Status: AC

## 2023-03-20 MED ORDER — TOPIRAMATE 100 MG PO TABS
100.0000 mg | ORAL_TABLET | Freq: Every day | ORAL | 1 refills | Status: DC
Start: 2023-03-20 — End: 2023-08-02

## 2023-03-20 MED ORDER — PANTOPRAZOLE SODIUM 40 MG PO TBEC: 40.0000 mg | DELAYED_RELEASE_TABLET | Freq: Two times a day (BID) | ORAL | 3 refills | Status: DC

## 2023-03-20 NOTE — Assessment & Plan Note (Signed)
Hydrate well 

## 2023-03-20 NOTE — Assessment & Plan Note (Signed)
Try Vistaril prn   Psychiatry appt is on 7/30.

## 2023-03-20 NOTE — Assessment & Plan Note (Signed)
[facility] tid

## 2023-03-20 NOTE — Assessment & Plan Note (Signed)
Continue good hydration.  Monitor GFR

## 2023-03-20 NOTE — Assessment & Plan Note (Signed)
Accrufer bid Check CBC

## 2023-03-20 NOTE — Progress Notes (Signed)
Subjective:  Patient ID: Emily Park, female    DOB: [birth year]  Age: 52 y.o. MRN: [id number]  CC: Follow-up (3 MNTH F/U)   HPI Emily Park presents for severe insomnia - out of Clonazepam. Can't sleep... Feeling worse. Psychiatry appt is on 7/30.   Outpatient Medications Prior to Visit  Medication Sig Dispense Refill   ACCRUFER 30 MG CAPS Take 1 capsule by mouth 2 (two) times daily.     amphetamine-dextroamphetamine (ADDERALL) 30 MG tablet Take 1 tablet by mouth 2 (two) times daily.     busPIRone (BUSPAR) 5 MG tablet Take 1 tablet (5 mg total) by mouth 2 (two) times daily. 60 tablet 0   Cholecalciferol (VITAMIN D3) 50 MCG (2000 UT) capsule Take 2,000 Units by mouth daily.     clonazePAM (KLONOPIN) 0.25 MG disintegrating tablet Take 1 tablet (0.25 mg total) by mouth at bedtime. Take with the 0.5mg  Klonopin at night. 11 tablet 0   doxepin (SINEQUAN) 75 MG capsule Take 1 capsule (75 mg total) by mouth at bedtime. 30 capsule 0   hydrocortisone (ANUSOL-HC) 2.5 % rectal cream Apply 1 Application topically 4 (four) times daily as needed for hemorrhoids. 30 g 0   KLOR-CON M20 20 MEQ tablet TAKE 1 TABLET BY MOUTH EVERY DAY 90 tablet 1   linaclotide (LINZESS) 145 MCG CAPS capsule Take 1 capsule (145 mcg total) by mouth daily before breakfast. 30 capsule 0   Multiple Vitamin (MULTIVITAMIN WITH MINERALS) TABS tablet Take 1 tablet by mouth every morning.     polyethylene glycol (MIRALAX / GLYCOLAX) 17 g packet Take 17 g by mouth 2 (two) times daily. (Patient taking differently: Take 17 g by mouth daily as needed for moderate constipation.) 14 each 0   polyvinyl alcohol (LIQUIFILM TEARS) 1.4 % ophthalmic solution Place 1 drop into both eyes as needed for dry eyes. 15 mL 0   promethazine (PHENERGAN) 12.5 MG tablet Take by mouth.     rizatriptan (MAXALT) 10 MG tablet PLEASE SEE ATTACHED FOR DETAILED DIRECTIONS     vortioxetine HBr (TRINTELLIX) 20 MG TABS tablet Take 1 tablet (20 mg total) by  mouth daily. 30 tablet 0   Ascorbic Acid (VITAMIN C PO) Take 1 tablet by mouth every morning.     clonazePAM (KLONOPIN) 0.5 MG tablet Take 1 tablet (0.5 mg total) by mouth at bedtime for 11 days. Take with the 0.25mg  tablet klonopin nightly. 11 tablet 0   pantoprazole (PROTONIX) 40 MG tablet Take 40 mg by mouth 2 (two) times daily.     SODIUM FLUORIDE 5000 PPM 1.1 % PSTE      topiramate (TOPAMAX) 100 MG tablet Take 1 tablet (100 mg total) by mouth daily. 30 tablet 0   loratadine (CLARITIN) 10 MG tablet Take 1 tablet (10 mg total) by mouth daily as needed for allergies. (Patient not taking: Reported on 01/19/2023) 30 tablet 0   No facility-administered medications prior to visit.    ROS: Review of Systems  Constitutional:  Negative for activity change, appetite change, chills, fatigue and unexpected weight change.  HENT:  Negative for congestion, mouth sores and sinus pressure.   Eyes:  Negative for visual disturbance.  Respiratory:  Negative for cough and chest tightness.   Gastrointestinal:  Negative for abdominal pain and nausea.  Genitourinary:  Negative for difficulty urinating, frequency and vaginal pain.  Musculoskeletal:  Negative for back pain and gait problem.  Skin:  Negative for pallor and rash.  Neurological:  Negative for dizziness,  tremors, weakness, numbness and headaches.  Psychiatric/Behavioral:  Positive for dysphoric mood. Negative for confusion, self-injury, sleep disturbance and suicidal ideas. The patient is nervous/anxious.     Objective:  BP 130/86 (BP Location: Left Arm, Patient Position: Sitting, Cuff Size: Normal)   Pulse (!) 107   Temp 98.3 F (36.8 C) (Oral)   Ht 5\' 4"  (1.626 m)   Wt 120 lb (54.4 kg)   SpO2 99%   BMI 20.60 kg/m   BP Readings from Last 3 Encounters:  03/20/23 130/86  01/11/23 118/80  12/28/22 124/78    Wt Readings from Last 3 Encounters:  03/20/23 120 lb (54.4 kg)  01/11/23 126 lb (57.2 kg)  12/27/22 122 lb 12.7 oz (55.7 kg)     Physical Exam Constitutional:      General: She is not in acute distress.    Appearance: Normal appearance. She is well-developed.  HENT:     Head: Normocephalic.     Right Ear: External ear normal.     Left Ear: External ear normal.     Nose: Nose normal.  Eyes:     General:        Right eye: No discharge.        Left eye: No discharge.     Conjunctiva/sclera: Conjunctivae normal.     Pupils: Pupils are equal, round, and reactive to light.  Neck:     Thyroid: No thyromegaly.     Vascular: No JVD.     Trachea: No tracheal deviation.  Cardiovascular:     Rate and Rhythm: Normal rate and regular rhythm.     Heart sounds: Normal heart sounds.  Pulmonary:     Effort: No respiratory distress.     Breath sounds: No stridor. No wheezing.  Abdominal:     General: Bowel sounds are normal. There is no distension.     Palpations: Abdomen is soft. There is no mass.     Tenderness: There is no abdominal tenderness. There is no guarding or rebound.  Musculoskeletal:        General: No tenderness.     Cervical back: Normal range of motion and neck supple. No rigidity.  Lymphadenopathy:     Cervical: No cervical adenopathy.  Skin:    Findings: No erythema or rash.  Neurological:     Cranial Nerves: No cranial nerve deficit.     Motor: No abnormal muscle tone.     Coordination: Coordination normal.     Deep Tendon Reflexes: Reflexes normal.  Psychiatric:        Behavior: Behavior normal.        Thought Content: Thought content normal.        Judgment: Judgment normal.   Dry mouth Sad  Lab Results  Component Value Date   WBC 6.1 01/11/2023   HGB 12.4 01/11/2023   HCT 37.6 01/11/2023   PLT 457.0 (H) 01/11/2023   GLUCOSE 94 01/11/2023   CHOL 208 (H) 02/24/2014   TRIG 81.0 02/24/2014   HDL 54.90 02/24/2014   LDLCALC 137 (H) 02/24/2014   ALT 18 01/11/2023   AST 23 01/11/2023   NA 134 (L) 01/11/2023   K 3.2 (L) 01/11/2023   CL 105 01/11/2023   CREATININE 1.36 (H)  01/11/2023   BUN 23 01/11/2023   CO2 21 01/11/2023   TSH 0.175 (L) 12/21/2022   INR 1.1 12/20/2022   HGBA1C 4.9 12/20/2022    No results found.  Assessment & Plan:   Problem List Items Addressed This  Visit     Migraine   Relevant Medications   topiramate (TOPAMAX) 100 MG tablet   Acute renal failure superimposed on stage 2 chronic kidney disease (HCC) - Primary    Hydrate well      Dry mouth    [facility] tid      Relevant Orders   TSH   Generalized anxiety disorder    Try Vistaril prn   Psychiatry appt is on 7/30.       Relevant Medications   hydrOXYzine (ATARAX) 25 MG tablet   CKD stage 3a, GFR 45-59 ml/min (HCC)    Continue good hydration.  Monitor GFR      Relevant Orders   CBC with Differential/Platelet   Comprehensive metabolic panel   Iron, TIBC and Ferritin Panel   TSH   Chronic anemia    Accrufer bid Check CBC       Relevant Orders   CBC with Differential/Platelet   Comprehensive metabolic panel   Iron, TIBC and Ferritin Panel   TSH   Insomnia    Try Vistaril prn   Psychiatry appt is on 7/30.          Meds ordered this encounter  Medications   Sodium Fluoride (PREVIDENT 5000 BOOSTER PLUS) 1.1 % PSTE    Sig: As directed tid    Dispense:  100 mL    Refill:  3   hydrOXYzine (ATARAX) 25 MG tablet    Sig: Take 1 tablet (25 mg total) by mouth 2 (two) times daily as needed (anxiety, insomnia).    Dispense:  60 tablet    Refill:  0   pantoprazole (PROTONIX) 40 MG tablet    Sig: Take 1 tablet (40 mg total) by mouth 2 (two) times daily.    Dispense:  180 tablet    Refill:  3   topiramate (TOPAMAX) 100 MG tablet    Sig: Take 1 tablet (100 mg total) by mouth daily.    Dispense:  90 tablet    Refill:  1      Follow-up: Return in about 3 months (around 06/20/2023) for a follow-up visit.  Sonda Primes, MD

## 2023-03-21 ENCOUNTER — Other Ambulatory Visit (INDEPENDENT_AMBULATORY_CARE_PROVIDER_SITE_OTHER): Payer: Commercial Managed Care - PPO

## 2023-03-21 ENCOUNTER — Other Ambulatory Visit: Payer: Self-pay | Admitting: Internal Medicine

## 2023-03-21 ENCOUNTER — Other Ambulatory Visit (HOSPITAL_COMMUNITY): Payer: Commercial Managed Care - PPO

## 2023-03-21 DIAGNOSIS — R946 Abnormal results of thyroid function studies: Secondary | ICD-10-CM

## 2023-03-21 LAB — IRON,TIBC AND FERRITIN PANEL
%SAT: 19 % (calc) (ref 16–45)
Ferritin: 8 ng/mL — ABNORMAL LOW (ref 16–232)
Iron: 75 ug/dL (ref 45–160)
TIBC: 388 mcg/dL (calc) (ref 250–450)

## 2023-03-21 LAB — T4, FREE: Free T4: 1.08 ng/dL (ref 0.60–1.60)

## 2023-03-21 MED ORDER — POTASSIUM CHLORIDE CRYS ER 20 MEQ PO TBCR: 20.0000 meq | EXTENDED_RELEASE_TABLET | Freq: Two times a day (BID) | ORAL | 1 refills | Status: DC

## 2023-03-22 ENCOUNTER — Other Ambulatory Visit (HOSPITAL_COMMUNITY): Payer: Commercial Managed Care - PPO

## 2023-03-23 ENCOUNTER — Other Ambulatory Visit (HOSPITAL_COMMUNITY): Payer: Commercial Managed Care - PPO

## 2023-03-23 ENCOUNTER — Encounter: Payer: Self-pay | Admitting: Internal Medicine

## 2023-03-26 ENCOUNTER — Other Ambulatory Visit (HOSPITAL_COMMUNITY): Payer: Commercial Managed Care - PPO

## 2023-03-26 NOTE — Progress Notes (Unsigned)
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 non-face-to-face time during this encounter.   Stasia Cavalier, MD

## 2023-03-27 ENCOUNTER — Telehealth (HOSPITAL_COMMUNITY): Payer: Self-pay | Admitting: *Deleted

## 2023-03-27 ENCOUNTER — Other Ambulatory Visit (HOSPITAL_COMMUNITY): Payer: Commercial Managed Care - PPO

## 2023-03-27 ENCOUNTER — Encounter: Payer: Self-pay | Admitting: Internal Medicine

## 2023-03-27 ENCOUNTER — Encounter (HOSPITAL_COMMUNITY): Payer: Self-pay | Admitting: Psychiatry

## 2023-03-27 ENCOUNTER — Ambulatory Visit (HOSPITAL_BASED_OUTPATIENT_CLINIC_OR_DEPARTMENT_OTHER): Payer: Commercial Managed Care - PPO | Admitting: Psychiatry

## 2023-03-27 DIAGNOSIS — F332 Major depressive disorder, recurrent severe without psychotic features: Secondary | ICD-10-CM

## 2023-03-27 DIAGNOSIS — F411 Generalized anxiety disorder: Secondary | ICD-10-CM

## 2023-03-27 DIAGNOSIS — Z79899 Other long term (current) drug therapy: Secondary | ICD-10-CM

## 2023-03-27 MED ORDER — DOXEPIN HCL 75 MG PO CAPS
75.0000 mg | ORAL_CAPSULE | Freq: Every day | ORAL | 2 refills | Status: DC
Start: 2023-03-27 — End: 2023-05-02

## 2023-03-27 MED ORDER — SERTRALINE HCL 25 MG PO TABS
ORAL_TABLET | ORAL | 0 refills | Status: DC
Start: 2023-03-27 — End: 2023-05-02

## 2023-03-27 MED ORDER — PRAZOSIN HCL 2 MG PO CAPS
2.0000 mg | ORAL_CAPSULE | Freq: Every day | ORAL | 2 refills | Status: DC
Start: 2023-03-27 — End: 2023-05-02

## 2023-03-27 MED ORDER — SERTRALINE HCL 100 MG PO TABS
100.0000 mg | ORAL_TABLET | Freq: Every day | ORAL | 2 refills | Status: DC
Start: 2023-03-27 — End: 2023-04-18

## 2023-03-27 MED ORDER — CLONAZEPAM 1 MG PO TBDP: 1.0000 mg | ORAL_TABLET | Freq: Every day | ORAL | 2 refills | Status: DC

## 2023-03-27 MED ORDER — VORTIOXETINE HBR 10 MG PO TABS
10.0000 mg | ORAL_TABLET | Freq: Every day | ORAL | 0 refills | Status: DC
Start: 2023-03-27 — End: 2023-05-02

## 2023-03-27 NOTE — Telephone Encounter (Signed)
Referrals for ADHD evaluation sent to [facility] and [facility]. Pt advised.

## 2023-03-28 ENCOUNTER — Other Ambulatory Visit (HOSPITAL_COMMUNITY): Payer: Commercial Managed Care - PPO

## 2023-03-28 ENCOUNTER — Other Ambulatory Visit: Payer: Self-pay | Admitting: Internal Medicine

## 2023-03-28 DIAGNOSIS — J309 Allergic rhinitis, unspecified: Secondary | ICD-10-CM

## 2023-03-29 ENCOUNTER — Other Ambulatory Visit (HOSPITAL_COMMUNITY): Payer: Commercial Managed Care - PPO

## 2023-03-30 ENCOUNTER — Other Ambulatory Visit (HOSPITAL_COMMUNITY): Payer: Commercial Managed Care - PPO

## 2023-03-30 NOTE — Telephone Encounter (Signed)
Pt call back she states she just saw MD on 03/20/23, and also done labs. He was the one who stated she need to to see Immunologist../lmb

## 2023-03-30 NOTE — Telephone Encounter (Signed)
Gilda Crease, RMA This patient will need office visit before being referred. Specialist office require office note. Thanks  Sent pt my chart msg letting her know she has to see MD first../lmb

## 2023-04-06 NOTE — Telephone Encounter (Signed)
Patient called stating she would like the referral sent in.

## 2023-04-10 ENCOUNTER — Other Ambulatory Visit (HOSPITAL_COMMUNITY): Payer: Self-pay | Admitting: Psychiatry

## 2023-04-10 DIAGNOSIS — F411 Generalized anxiety disorder: Secondary | ICD-10-CM

## 2023-04-10 DIAGNOSIS — F332 Major depressive disorder, recurrent severe without psychotic features: Secondary | ICD-10-CM

## 2023-04-13 ENCOUNTER — Other Ambulatory Visit: Payer: Self-pay

## 2023-04-13 MED ORDER — ACCRUFER 30 MG PO CAPS: 1.0000 | ORAL_CAPSULE | Freq: Two times a day (BID) | ORAL | 0 refills | Status: DC

## 2023-04-18 ENCOUNTER — Other Ambulatory Visit (HOSPITAL_COMMUNITY): Payer: Self-pay | Admitting: Psychiatry

## 2023-04-18 ENCOUNTER — Ambulatory Visit (HOSPITAL_COMMUNITY): Payer: Commercial Managed Care - PPO | Admitting: Clinical

## 2023-04-18 DIAGNOSIS — F411 Generalized anxiety disorder: Secondary | ICD-10-CM

## 2023-04-18 DIAGNOSIS — F332 Major depressive disorder, recurrent severe without psychotic features: Secondary | ICD-10-CM

## 2023-04-24 ENCOUNTER — Telehealth (HOSPITAL_COMMUNITY): Payer: Self-pay

## 2023-04-24 DIAGNOSIS — F332 Major depressive disorder, recurrent severe without psychotic features: Secondary | ICD-10-CM

## 2023-04-24 MED ORDER — CLONAZEPAM 1 MG PO TABS
1.0000 mg | ORAL_TABLET | Freq: Every day | ORAL | 0 refills | Status: DC
Start: 2023-04-24 — End: 2023-05-02

## 2023-04-24 NOTE — Telephone Encounter (Signed)
Patients husband left a voicemail asking for a call back, patients father committed suicide last night and she is not doing well

## 2023-04-24 NOTE — Telephone Encounter (Signed)
Patient and her husband were contacted via telephone and spoke for approximately 15 minutes.   Patient reported that her father committed suicide by shooting himself last night. Both patient and her husband were at the beach on vacation and just drove home. Ambert has been struggling with how to handle this going through a range of emotions including shock, sadness, anger, regret, and self blame. She denied any thoughts of self harm or suicide and her husband still does manage her medications.We reviewed crisis recourses if suicidality were to change.   We reviewed the stages of grief and encouraged the patient to lean on the supports of her family during this time. In regards to medication patient notes that she did take an extra dose of Klonopin last night. We reviewed the importance of not overusing medications, though agreed that a short term increase (7 days) of Klonopin to 1 mg twice a day would be appropriate during this time of increased stress.   In regards to the other medications patient notes that she only briefly tried Prazosin with no notable benefit. Due to concern of dizziness and her hx of falls they opted to discontinue the medication. As for the transition off of Trintellix and onto Zoloft it had seemed to be going well other then some initial withdrawal symptoms. Up until yesterday the Zoloft had appeared to be having some positive effect on patients mood symptoms.   We will schedule patient for a follow up next week.

## 2023-05-01 NOTE — Progress Notes (Signed)
BH MD/PA/NP OP Progress Note  05/02/2023 11:40 AM Emily Park  MRN:  [id number]  Visit Diagnosis:    ICD-10-CM   1. Generalized anxiety disorder  F41.1 mirtazapine (REMERON) 7.5 MG tablet    cloNIDine (CATAPRES) 0.1 MG tablet    2. MDD (major depressive disorder), recurrent severe, without psychosis (HCC)  F33.2 mirtazapine (REMERON) 7.5 MG tablet    cloNIDine (CATAPRES) 0.1 MG tablet    3. Long-term current use of benzodiazepine  Z79.899       Assessment: Emily Park is a 52 y.o. female with a history of MDD with suicide attempt and subsequent hospitalization in May 2024, GAD, reported ADHD, and IBS who presented to [facility] for initial evaluation on 03/27/2023.  At initial evaluation patient reported struggling with neurovegetative symptoms of depression including low mood, anhedonia, amotivation, disturbed sleep, hopelessness, feelings of worthlessness, and negative self thoughts.  She denied any SI/HI or thoughts of self-harm though had acted on suicidal thoughts in May 2024.  Crisis resources were reviewed and patient has completed safety planning.  Medications are managed by her husband and she is able to list a number of supports she can reach out to if needed.  Patient also struggled with symptoms of anxiety including excessive worry, fear of something awful happening, restlessness, palpitations, chest pressure, and shortness of breath.  At times the anxiety can progress to panic episodes.  Patient does have a past trauma history from which she experiences nightmares and hypervigilance though she denies any other symptoms consistent with PTSD.  Also of note she has been prescribed high-dose benzodiazepines for over a decade and is in the process of tapering off of them.  Psychosocially patient has good supports in husband, family, and friends.  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  There  is increased financial stressors at time of initial evaluation.  Patient met criteria for MDD and GAD.  She did have a former diagnosis of ADHD this could be complicated by her other underlying psychiatric disorders and further neurological testing would be appropriate.  Emily Park presents for follow-up evaluation. Today, 05/02/23, patient reports increased depression, anxiety, insomnia and mood lability following the loss of her father to suicide last week.  She has had improvement in affect compared to phone conversation last week.  Despite this patient does still present with significant concern for depression.  She would benefit from increased support by IOP referral and plans to also start group counseling in the near future.  In regards to medication she has had some benefit from the increase in Klonopin.  We will continue the 7-day increase as initially planned which will finish tomorrow and then return back to 1 mg daily.  Patient had [place] doxepin in [place] due to lack of perceived benefit.  This is likely secondary to the increased stress she is currently experiencing.  We will start clonidine which she has taken with good benefit in the past and mirtazapine today for mood, sleep, and appetite symptoms.  Patient will follow up in a month.  Plan: - Discontinued Trintellix  - Continue Zoloft 100 mg daily - Start Mirtazapine 7.5 mg QHS - Continue Klonopin 1 mg BID for one more day before decreasing to 1 mg daily - Start Clonidine 0.1 mg at bedtime - Continue Topamax 100 mg in the morning for migraines - Discontinue Doxepin 75 mg  nightly, no benefit - Discontinue Prazosin, concern about dizziness and thus did not start - Start grief counseling  - Refer to IOP - Provided info about group therapy options like [facility] and [facility] - Neuropsysch testing referral - Crisis resources reviewed - Follow up in a month   Chief Complaint:  Chief Complaint  Patient  presents with   Follow-up   HPI:  Emily Park reporting increased depression after her father committed suicide. Provider sent in a 7 day script of Klonopin 1 mg BID which patient will finish tomorrow.  Patient presented today accompanied by her husband.  She reports that she is still really struggling with grief and depression following the loss of her father.  Patient notes that she has been intermittently tearful, with worsening insomnia, decreased motivation, decreased appetite, and fatigue.  She denies any SI or thoughts of self-harm.  She can also have periods of mood lability where she is more anxious, irritable, or euthymic.  Of note patient does still have labile affect however less so compared to her phone conversation with this provider last week.  She has been able to identify some of the positives and happy times that she spent with her father instead of focusing on the negatives and recent suicide.  Patient also no longer endorsed thoughts of self blame for his suicide.  Patient's husband reports that she has had mood swings ever since the event.  He had to go back to work yesterday and is really worried about her on her own.  He found that while she had improved at the IOP program there had already been a decline even before this event.   We discussed the importance of social support during this time and the benefit of not spending excessive amounts of time on her own.  Patient did acknowledge she feels better when she has people around her.  We discussed the option of returning to the IOP program which she was interested in.  In regards to medication she did feel the increase in Klonopin was helpful and had been interested in continuing with twice a day dosing.  After reviewing potential risk and negative long-term effects patient was open to continuing the 7-day prescription as planned before switching back to Klonopin 1 mg daily.  In regards to sleep she discontinued  doxepin due to lack of benefit.  It is possible that this is secondary to the increased grief she is currently experiencing.  Patient was interested in restarting clonidine which she had taken in the past for sleep with good effect.  We reviewed the risk and benefits especially in relation to the dizziness as she had this this is a concern about prazosin.  Patient expressed her understanding and was not concerned that she had not experienced this side effect in the past.  We also discussed starting on mirtazapine to help stimulate appetite and sleep in addition to improving depression.  In addition patient will start grief counseling in the near future.  Patient's husband had asked about mood stabilizers and why we can consider those in the future they do not appear to be indicated at this time.  Her current mood lability appears to be more in relation to recent stressors and working through the stages of grief.  Past Psychiatric History: Patient was admitted to [facility] in 12/2022.  After which she stepped down to PHP on 01/16/2023 followed by IOP on 02/08/2023.  Patient had been admitted to the hospital following  a suicide attempt by insulin overdose.  Prior to this she had no other suicide attempts or episodes of self-harm. OPT: Dr. Evelene Croon 10+ years Therapist: [facility] 2x, but no insurance coverage, appointment scheduled with [facility] Previous: Klonopin 1mg  QHS and doxepin 75mg  QHS, patient has been on Klonopin 10 years, they were rx Klonopin 1mg , TID but she gets sedated so was only taking BID/QHS until around 09/2022, Abilify, Prozac (worked then pooped out after 20+ years), Trintellix, doxepin ( Xanax, Wellbutrin ( failed), BuSar (ineffective), Atarax (ineffective), Ambien ( behavior changes, spending money suddenly). Trazodone did not help with sleep.   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  Patient is being tapered  off of Klonopin now.  She denies any current ethanol use though had in the past.  Patient discontinued due to developing stomach ulcers.  She denies any THC, tobacco, or other substance use.  Past Medical History:  Past Medical History:  Diagnosis Date   Abnormal TSH    ADD (attention deficit disorder)    Anemia    Anxiety    Arrhythmia    Decreased libido    Depression    Heart murmur    Hyperlipidemia    Hypokalemia    Migraine    Nephrolithiasis    Pap smear abnormality of cervix/human papillomavirus (HPV) positive    Sleep difficulties    SVT (supraventricular tachycardia)     Past Surgical History:  Procedure Laterality Date   ABLATION     Cardiac ablation, endometrial ablation   BREAST LUMPECTOMY     right breast   BREAST LUMPECTOMY Right    CARDIOVERSION     COLONOSCOPY WITH PROPOFOL N/A 05/16/2021   Procedure: COLONOSCOPY WITH PROPOFOL;  Surgeon: Charlott Rakes, MD;  Location: [facility];  Service: Endoscopy;  Laterality: N/A;   COLPOSCOPY     w/ cervical biopsy   DILITATION & CURRETTAGE/HYSTROSCOPY WITH NOVASURE ABLATION N/A 09/11/2014   Procedure: DILATATION & CURETTAGE/HYSTEROSCOPY WITH NOVASURE ABLATION;  Surgeon: Lenoard Aden, MD;  Location: [facility];  Service: Gynecology;  Laterality: N/A;   GIVENS CAPSULE STUDY N/A 05/16/2021   Procedure: GIVENS CAPSULE STUDY;  Surgeon: Charlott Rakes, MD;  Location: [facility];  Service: Endoscopy;  Laterality: N/A;   XI ROBOT ASSISTED RECTOPEXY N/A 07/07/2021   Procedure: XI ROBOT ASSISTED RECTOPEXY;  Surgeon: Karie Soda, MD;  Location: [facility];  Service: General;  Laterality: N/A;   XI ROBOTIC ASSISTED LOWER ANTERIOR RESECTION N/A 07/07/2021   Procedure: XI ROBOTIC ASSISTED LOW ANTERIOR RECTOSIGMOID RESECTION WITH RIGID PROCTOSCOPY, TAP BLOCK;  Surgeon: Karie Soda, MD;  Location: [facility];  Service: General;  Laterality: N/A;   Family History:  Family History  Problem Relation Age of Onset   Hypothyroidism  Mother    Depression Mother    Cancer Mother        thyroid   Thyroid disease Mother    Alcohol abuse Father    Hypertension Father    Heart attack Father        Age 44   AAA (abdominal aortic aneurysm) Father    Depression Sister    Hypothyroidism Sister    Hyperthyroidism Sister    Diabetes Maternal Grandfather    Depression Maternal Grandmother    Breast cancer Paternal Grandmother    Depression Cousin     Social History:  Social History   Socioeconomic History   Marital status: Married  Spouse name: Not on file   Number of children: 2   Years of education: college   Highest education level: Associate degree: academic program  Occupational History   Occupation: Charity fundraiser with Engineer, manufacturing systems: [employer]   Tobacco Use   Smoking status: Never    Passive exposure: Current   Smokeless tobacco: Never  Vaping Use   Vaping status: Never Used  Substance and Sexual Activity   Alcohol use: Not Currently    Alcohol/week: 2.0 standard drinks of alcohol    Types: 2 Glasses of wine per week   Drug use: Not Currently   Sexual activity: Yes    Partners: Male    Comment: Married  Other Topics Concern   Not on file  Social History Narrative   Lives at home with two sons.   Right-handed.   60 ounces of tea and soda per day.   Social Determinants of Health   Financial Resource Strain: Low Risk  (10/16/2018)   Received from [facility] visits prior to 10/28/2022., [facility] visits prior to 10/28/2022.   Overall Financial Resource Strain (CARDIA)    Difficulty of Paying Living Expenses: Not hard at all  Food Insecurity: Food Insecurity Present (12/28/2022)   Hunger Vital Sign    Worried About Running Out of Food in the Last Year: Sometimes true    Ran Out of Food in the Last Year: Sometimes true  Transportation Needs: No Transportation Needs (12/28/2022)   PRAPARE - Administrator, Civil Service (Medical): No    Lack of  Transportation (Non-Medical): No  Physical Activity: Not on file  Stress: Not on file  Social Connections: Unknown (01/12/2022)   Received from [facility]   Social Network    Social Network: Not on file    Allergies:  Allergies  Allergen Reactions   Zithromax [Azithromycin] Rash    Current Medications: Current Outpatient Medications  Medication Sig Dispense Refill   cloNIDine (CATAPRES) 0.1 MG tablet Take 1 tablet (0.1 mg total) by mouth every evening. 30 tablet 2   mirtazapine (REMERON) 7.5 MG tablet Take 1 tablet (7.5 mg total) by mouth at bedtime. 30 tablet 2   ACCRUFER 30 MG CAPS Take 1 capsule (30 mg total) by mouth 2 (two) times daily. 90 capsule 0   Cholecalciferol (VITAMIN D3) 50 MCG (2000 UT) capsule Take 2,000 Units by mouth daily.     clonazePAM (KLONOPIN) 1 MG disintegrating tablet Take 1 tablet (1 mg total) by mouth at bedtime. 30 tablet 2   hydrocortisone (ANUSOL-HC) 2.5 % rectal cream Apply 1 Application topically 4 (four) times daily as needed for hemorrhoids. 30 g 0   linaclotide (LINZESS) 145 MCG CAPS capsule Take 1 capsule (145 mcg total) by mouth daily before breakfast. 30 capsule 0   loratadine (CLARITIN) 10 MG tablet Take 1 tablet (10 mg total) by mouth daily as needed for allergies. (Patient not taking: Reported on 01/19/2023) 30 tablet 0   Multiple Vitamin (MULTIVITAMIN WITH MINERALS) TABS tablet Take 1 tablet by mouth every morning.     pantoprazole (PROTONIX) 40 MG tablet Take 1 tablet (40 mg total) by mouth 2 (two) times daily. 180 tablet 3   polyethylene glycol (MIRALAX / GLYCOLAX) 17 g packet Take 17 g by mouth 2 (two) times daily. (Patient taking differently: Take 17 g by mouth daily as needed for moderate constipation.) 14 each 0   polyvinyl alcohol (LIQUIFILM TEARS) 1.4 % ophthalmic solution  Place 1 drop into both eyes as needed for dry eyes. 15 mL 0   potassium chloride SA (KLOR-CON M20) 20 MEQ tablet Take 1 tablet (20 mEq total) by mouth 2 (two) times  daily. 180 tablet 1   rizatriptan (MAXALT) 10 MG tablet PLEASE SEE ATTACHED FOR DETAILED DIRECTIONS     sertraline (ZOLOFT) 100 MG tablet TAKE 1 TABLET (100 MG TOTAL) BY MOUTH DAILY. START IN 3 WEEKS AFTER HAVING TAKEN ZOLOFT 75 MG DAILY FOR ONE WEEK. 90 tablet 0   Sodium Fluoride (PREVIDENT 5000 BOOSTER PLUS) 1.1 % PSTE As directed tid 100 mL 3   topiramate (TOPAMAX) 100 MG tablet Take 1 tablet (100 mg total) by mouth daily. 90 tablet 1   No current facility-administered medications for this visit.     Psychiatric Specialty Exam: Review of Systems  There were no vitals taken for this visit.There is no height or weight on file to calculate BMI.  General Appearance: Disheveled  Eye Contact:  Fair  Speech:  Clear and Coherent  Volume:  Normal  Mood:  Depressed and Dysphoric  Affect:  Congruent, Labile, Full Range, and Tearful  Thought Process:  Coherent  Orientation:  Full (Time, Place, and Person)  Thought Content: Logical, Illogical, and Rumination   Suicidal Thoughts:  No  Homicidal Thoughts:  No  Memory:  Immediate;   Fair  Judgement:  Fair  Insight:  Fair  Psychomotor Activity:  Decreased and Restlessness  Concentration:  Concentration: Fair  Recall:  Fair  Fund of Knowledge: Fair  Language: Good  Akathisia:  No    AIMS (if indicated): not done  Assets:  Communication Skills Desire for Improvement Financial Resources/Insurance Housing Social Support  ADL's:  Intact  Cognition: WNL  Sleep:  Fair   Metabolic Disorder Labs: Lab Results  Component Value Date   HGBA1C 4.9 12/20/2022   MPG 93.93 12/20/2022   MPG 93.93 07/06/2021   No results found for: "PROLACTIN" Lab Results  Component Value Date   CHOL 208 (H) 02/24/2014   TRIG 81.0 02/24/2014   HDL 54.90 02/24/2014   CHOLHDL 4 02/24/2014   VLDL 16.2 02/24/2014   LDLCALC 137 (H) 02/24/2014   Lab Results  Component Value Date   TSH 0.18 (L) 03/20/2023   TSH 0.175 (L) 12/21/2022    Therapeutic Level  Labs: No results found for: "LITHIUM" No results found for: "VALPROATE" No results found for: "CBMZ"   Screenings: AIMS    Flowsheet Row [facility]  AIMS Total Score 0      AUDIT    Flowsheet Row Ad[facility] ([facility]  Alcohol Use Disorder Identification Test Final Score (AUDIT) 1      GAD-7    Flowsheet Row Office Visit from 03/20/2023 in [facility] [place] Counselor from 01/09/2023 [facility] Office Visit from 11/22/2022 in [facility]  Total GAD-7 Score 7 15 12       PHQ2-9    Flowsheet Row Office Visit from 03/20/2023 in [facility] [place] Counselor from 02/05/2023 in [facility] Counselor from 01/19/2023 in [facility] Counselor from 01/09/2023 in [facility] Office Visit from 11/22/2022 in [facility]  PHQ-2 Total Score 4 1 2 2 2   PHQ-9 Total Score 14 2  11 12 7       Flowsheet Row [facility]/27/2023 [facility] Counselor from 01/19/2023 in [facility] Counselor from 01/09/2023 in [facility]  C-SSRS RISK CATEGORY Moderate Risk Error: Question 6 not populated Error: Q3, 4, or 5 should not be populated when Q2 is No       Collaboration of Care: Collaboration of Care: Medication Management AEB medication prescription, Other provider involved in patient's care AEB internal med chart review, and Referral or follow-up with counselor/therapist AEB IOP referral  Patient/Guardian was advised Release of Information must be obtained prior to any  record release in order to collaborate their care with an outside provider. Patient/Guardian was advised if they have not already done so to contact the registration department to sign all necessary forms in order for [name] to release information regarding their care.   Consent: Patient/Guardian gives verbal consent for treatment and assignment of benefits for services provided during this visit. Patient/Guardian expressed understanding and agreed to proceed.    Stasia Cavalier, MD 05/02/2023, 11:40 AM   Virtual Visit via Video Note  I connected with Franchon Worm wood on 05/02/23 at 11:00 AM EDT by a video enabled telemedicine application and verified that I am speaking with the correct person using two identifiers.  Location: Patient: Home Provider: Home Office   I discussed the limitations of evaluation and management by telemedicine and the availability of in person appointments. The patient expressed understanding and agreed to proceed.   I discussed the assessment and treatment plan with the patient. The patient was provided an opportunity to ask questions and all were answered. The patient agreed with the plan and demonstrated an understanding of the instructions.   The patient was advised to call back or seek an in-person evaluation if the symptoms worsen or if the condition fails to improve as anticipated.  I provided 30 minutes of non-face-to-face time during this encounter.   Stasia Cavalier, MD

## 2023-05-02 ENCOUNTER — Telehealth (HOSPITAL_COMMUNITY): Payer: Self-pay | Admitting: Professional

## 2023-05-02 ENCOUNTER — Encounter (HOSPITAL_COMMUNITY): Payer: Self-pay | Admitting: Psychiatry

## 2023-05-02 ENCOUNTER — Other Ambulatory Visit (HOSPITAL_COMMUNITY): Payer: Self-pay | Admitting: Psychiatry

## 2023-05-02 ENCOUNTER — Telehealth (HOSPITAL_BASED_OUTPATIENT_CLINIC_OR_DEPARTMENT_OTHER): Payer: Commercial Managed Care - PPO | Admitting: Psychiatry

## 2023-05-02 DIAGNOSIS — F411 Generalized anxiety disorder: Secondary | ICD-10-CM

## 2023-05-02 DIAGNOSIS — F332 Major depressive disorder, recurrent severe without psychotic features: Secondary | ICD-10-CM

## 2023-05-02 DIAGNOSIS — Z79899 Other long term (current) drug therapy: Secondary | ICD-10-CM

## 2023-05-02 MED ORDER — MIRTAZAPINE 7.5 MG PO TABS: 7.5000 mg | ORAL_TABLET | Freq: Every day | ORAL | 2 refills | Status: DC

## 2023-05-02 MED ORDER — CLONIDINE HCL 0.1 MG PO TABS
0.1000 mg | ORAL_TABLET | Freq: Every evening | ORAL | 2 refills | Status: DC
Start: 2023-05-02 — End: 2023-05-02

## 2023-05-03 ENCOUNTER — Other Ambulatory Visit (HOSPITAL_COMMUNITY): Payer: Commercial Managed Care - PPO | Attending: Psychiatry | Admitting: Licensed Clinical Social Worker

## 2023-05-03 DIAGNOSIS — Z634 Disappearance and death of family member: Secondary | ICD-10-CM | POA: Diagnosis not present

## 2023-05-03 DIAGNOSIS — F331 Major depressive disorder, recurrent, moderate: Secondary | ICD-10-CM | POA: Diagnosis not present

## 2023-05-03 DIAGNOSIS — F411 Generalized anxiety disorder: Secondary | ICD-10-CM | POA: Diagnosis present

## 2023-05-03 DIAGNOSIS — F332 Major depressive disorder, recurrent severe without psychotic features: Secondary | ICD-10-CM
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
## 2023-05-04 ENCOUNTER — Other Ambulatory Visit (HOSPITAL_COMMUNITY): Payer: Commercial Managed Care - PPO

## 2023-05-07 ENCOUNTER — Telehealth (HOSPITAL_COMMUNITY): Payer: Self-pay | Admitting: Licensed Clinical Social Worker

## 2023-05-07 ENCOUNTER — Other Ambulatory Visit (HOSPITAL_COMMUNITY): Payer: Commercial Managed Care - PPO

## 2023-05-08 ENCOUNTER — Other Ambulatory Visit (HOSPITAL_COMMUNITY): Payer: Commercial Managed Care - PPO

## 2023-05-09 ENCOUNTER — Other Ambulatory Visit (HOSPITAL_COMMUNITY): Payer: Commercial Managed Care - PPO

## 2023-05-10 ENCOUNTER — Other Ambulatory Visit (HOSPITAL_COMMUNITY): Payer: Commercial Managed Care - PPO

## 2023-05-11 ENCOUNTER — Other Ambulatory Visit (HOSPITAL_COMMUNITY): Payer: Commercial Managed Care - PPO

## 2023-05-11 DIAGNOSIS — F4312 Post-traumatic stress disorder, chronic: Secondary | ICD-10-CM | POA: Insufficient documentation

## 2023-05-14 ENCOUNTER — Encounter (HOSPITAL_COMMUNITY): Payer: Self-pay

## 2023-05-14 ENCOUNTER — Other Ambulatory Visit (HOSPITAL_COMMUNITY): Payer: Commercial Managed Care - PPO | Attending: Psychiatry | Admitting: Licensed Clinical Social Worker

## 2023-05-14 ENCOUNTER — Other Ambulatory Visit (HOSPITAL_COMMUNITY): Payer: Commercial Managed Care - PPO | Attending: Psychiatry

## 2023-05-14 DIAGNOSIS — R4589 Other symptoms and signs involving emotional state: Secondary | ICD-10-CM | POA: Insufficient documentation

## 2023-05-14 DIAGNOSIS — F332 Major depressive disorder, recurrent severe without psychotic features: Secondary | ICD-10-CM | POA: Diagnosis present

## 2023-05-14 DIAGNOSIS — Z634 Disappearance and death of family member: Secondary | ICD-10-CM | POA: Insufficient documentation

## 2023-05-14 DIAGNOSIS — F411 Generalized anxiety disorder: Secondary | ICD-10-CM | POA: Diagnosis not present

## 2023-05-14 DIAGNOSIS — G43909 Migraine, unspecified, not intractable, without status migrainosus: Secondary | ICD-10-CM | POA: Insufficient documentation

## 2023-05-14 MED ORDER — SERTRALINE HCL 100 MG PO TABS: 150.0000 mg | ORAL_TABLET | Freq: Every day | ORAL | 0 refills | Status: DC
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
## 2023-05-15 ENCOUNTER — Other Ambulatory Visit (HOSPITAL_COMMUNITY): Payer: Commercial Managed Care - PPO

## 2023-05-16 ENCOUNTER — Other Ambulatory Visit (HOSPITAL_COMMUNITY): Payer: Commercial Managed Care - PPO

## 2023-05-17 ENCOUNTER — Other Ambulatory Visit (HOSPITAL_COMMUNITY): Payer: Commercial Managed Care - PPO | Admitting: Licensed Clinical Social Worker

## 2023-05-17 ENCOUNTER — Encounter (HOSPITAL_COMMUNITY): Payer: Self-pay

## 2023-05-17 ENCOUNTER — Other Ambulatory Visit (HOSPITAL_COMMUNITY): Payer: Commercial Managed Care - PPO

## 2023-05-17 DIAGNOSIS — F332 Major depressive disorder, recurrent severe without psychotic features: Secondary | ICD-10-CM | POA: Diagnosis not present

## 2023-05-17 DIAGNOSIS — Z634 Disappearance and death of family member: Secondary | ICD-10-CM

## 2023-05-17 DIAGNOSIS — F411 Generalized anxiety disorder: Secondary | ICD-10-CM

## 2023-05-17 DIAGNOSIS — R4589 Other symptoms and signs involving emotional state: Secondary | ICD-10-CM

## 2023-05-17 NOTE — Therapy (Signed)
Lung nodules 11/14/2015   Paroxysmal SVT (supraventricular tachycardia) 11/13/2015   Attention deficit hyperactivity disorder (ADHD) 11/13/2015    Ted Mcalpine, OT 05/17/2023, 10:27 PM  Kerrin Champagne, OT   [facility] PROGRAM [address] [place], [place], [address] Phone: [phone]   Fax:  [phone]  Name: Emily Park MRN: [id number] Date of Birth: 1971/08/27  Lung nodules 11/14/2015   Paroxysmal SVT (supraventricular tachycardia) 11/13/2015   Attention deficit hyperactivity disorder (ADHD) 11/13/2015    Ted Mcalpine, OT 05/17/2023, 10:27 PM  Kerrin Champagne, OT   [facility] PROGRAM [address] [place], [place], [address] Phone: [phone]   Fax:  [phone]  Name: Emily Park MRN: [id number] Date of Birth: 1971/08/27  [facility] PARTIAL HOSPITALIZATION PROGRAM [address] [place], [place], [address] Phone: [phone]   Fax:  [phone]  Occupational Therapy Evaluation Virtual Visit via Video Note  I connected with Emily Park on 05/17/23 at  8:00 AM EDT by a video enabled telemedicine application and verified that I am speaking with the correct person using two identifiers.  Location: Patient: home Provider: office   I discussed the limitations of evaluation and management by telemedicine and the availability of in person appointments. The patient expressed understanding and agreed to proceed.    The patient was advised to call back or seek an in-person evaluation if the symptoms worsen or if the condition fails to improve as anticipated.  I provided 85 minutes of non-face-to-face time during this encounter.   Patient Details  Name: Emily Park MRN: [id number] Date of Birth: [birth year] No data recorded  Encounter Date: 05/14/2023   OT End of Session - 05/17/23 2217     Visit Number 1    Number of Visits 20    Date for OT Re-Evaluation 06/16/23    OT Start Time 0930    OT Stop Time 1255   eval: 30; Tx: 55   OT Time Calculation (min) 205 min    Activity Tolerance Patient tolerated treatment well             Past Medical History:  Diagnosis Date   Abnormal TSH    ADD (attention deficit disorder)    Anemia    Anxiety    Arrhythmia    Decreased libido    Depression    Heart murmur    Hyperlipidemia    Hypokalemia    Migraine    Nephrolithiasis    Pap smear abnormality of cervix/human papillomavirus (HPV) positive    Sleep difficulties    SVT (supraventricular tachycardia)     Past Surgical History:  Procedure Laterality Date   ABLATION     Cardiac ablation, endometrial ablation   BREAST LUMPECTOMY     right breast   BREAST LUMPECTOMY Right    CARDIOVERSION     COLONOSCOPY WITH PROPOFOL N/A 05/16/2021   Procedure: COLONOSCOPY  WITH PROPOFOL;  Surgeon: Charlott Rakes, MD;  Location: [facility];  Service: Endoscopy;  Laterality: N/A;   COLPOSCOPY     w/ cervical biopsy   DILITATION & CURRETTAGE/HYSTROSCOPY WITH NOVASURE ABLATION N/A 09/11/2014   Procedure: DILATATION & CURETTAGE/HYSTEROSCOPY WITH NOVASURE ABLATION;  Surgeon: Lenoard Aden, MD;  Location: [facility];  Service: Gynecology;  Laterality: N/A;   GIVENS CAPSULE STUDY N/A 05/16/2021   Procedure: GIVENS CAPSULE STUDY;  Surgeon: Charlott Rakes, MD;  Location: [facility];  Service: Endoscopy;  Laterality: N/A;   XI ROBOT ASSISTED RECTOPEXY N/A 07/07/2021   Procedure: XI ROBOT ASSISTED RECTOPEXY;  Surgeon: Karie Soda, MD;  Location: [facility];  Service: General;  Laterality: N/A;   XI ROBOTIC ASSISTED LOWER ANTERIOR RESECTION N/A 07/07/2021   Procedure: XI ROBOTIC ASSISTED LOW ANTERIOR RECTOSIGMOID RESECTION WITH RIGID PROCTOSCOPY, TAP BLOCK;  Surgeon: Karie Soda, MD;  Location: [facility];  Service: General;  Laterality: N/A;    There were no vitals filed for this visit.   Subjective Assessment - 05/17/23 2212     Subjective  I have had a lot happen ove rthe past few months and I really need to work on some things.    Pertinent History ADHS, MDD, GAD    Limitations coping skills, routines, goals    Patient Stated  Lung nodules 11/14/2015   Paroxysmal SVT (supraventricular tachycardia) 11/13/2015   Attention deficit hyperactivity disorder (ADHD) 11/13/2015    Ted Mcalpine, OT 05/17/2023, 10:27 PM  Kerrin Champagne, OT   [facility] PROGRAM [address] [place], [place], [address] Phone: [phone]   Fax:  [phone]  Name: Emily Park MRN: [id number] Date of Birth: 1971/08/27

## 2023-05-18 ENCOUNTER — Other Ambulatory Visit (HOSPITAL_COMMUNITY): Payer: Commercial Managed Care - PPO

## 2023-05-18 ENCOUNTER — Encounter (HOSPITAL_COMMUNITY): Payer: Self-pay

## 2023-05-18 NOTE — Therapy (Signed)
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   Anxiety 02/24/2021   Heart murmur 02/24/2021    Menorrhagia 02/24/2021   Thyroid nodule 10/26/2020   Acute renal failure superimposed on stage 2 chronic kidney disease (HCC)    Hypotension    Hypovolemic shock (HCC) 08/29/2020   Rectal prolapse 04/05/2020   Migraine 02/27/2019   Mild episode of recurrent major depressive disorder (HCC) 10/25/2018   Abnormal thyroid stimulating hormone (TSH) level 10/24/2018   Syncope and collapse 10/10/2018   Lung nodules 11/14/2015   Paroxysmal SVT (supraventricular tachycardia) 11/13/2015   Attention deficit hyperactivity disorder (ADHD) 11/13/2015    Ted Mcalpine, OT 05/18/2023, 9:23 AM  Kerrin Champagne, OT   [facility] HOSPITALIZATION PROGRAM [address] [place], [place], [address] Phone: [phone]   Fax:  [phone]  Name: ELDEAN Park MRN: [id number] Date of Birth: 1971/01/07  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   Anxiety 02/24/2021   Heart murmur 02/24/2021    Menorrhagia 02/24/2021   Thyroid nodule 10/26/2020   Acute renal failure superimposed on stage 2 chronic kidney disease (HCC)    Hypotension    Hypovolemic shock (HCC) 08/29/2020   Rectal prolapse 04/05/2020   Migraine 02/27/2019   Mild episode of recurrent major depressive disorder (HCC) 10/25/2018   Abnormal thyroid stimulating hormone (TSH) level 10/24/2018   Syncope and collapse 10/10/2018   Lung nodules 11/14/2015   Paroxysmal SVT (supraventricular tachycardia) 11/13/2015   Attention deficit hyperactivity disorder (ADHD) 11/13/2015    Ted Mcalpine, OT 05/18/2023, 9:23 AM  Kerrin Champagne, OT   [facility] HOSPITALIZATION PROGRAM [address] [place], [place], [address] Phone: [phone]   Fax:  [phone]  Name: ELDEAN Park MRN: [id number] Date of Birth: 1971/01/07  [facility] PARTIAL HOSPITALIZATION PROGRAM [address] [place], [place], [address] Phone: [phone]   Fax:  [phone]  Occupational Therapy Treatment Virtual Visit via Video Note  I connected with Emily Park on 05/18/23 at  8:00 AM EDT by a video enabled telemedicine application and verified that I am speaking with the correct person using two identifiers.  Location: Patient: home Provider: office   I discussed the limitations of evaluation and management by telemedicine and the availability of in person appointments. The patient expressed understanding and agreed to proceed.    The patient was advised to call back or seek an in-person evaluation if the symptoms worsen or if the condition fails to improve as anticipated.  I provided 40 minutes of non-face-to-face time during this encounter.   Patient Details  Name: Emily Park MRN: [id number] Date of Birth: Nov 27, 1970 No data recorded  Encounter Date: 05/17/2023   OT End of Session - 05/18/23 0921     Visit Number 2    Number of Visits 20    Date for OT Re-Evaluation 06/16/23    OT Start Time 1200    OT Stop Time 1240   pt left group early today d/t an appointment.   OT Time Calculation (min) 40 min             Past Medical History:  Diagnosis Date   Abnormal TSH    ADD (attention deficit disorder)    Anemia    Anxiety    Arrhythmia    Decreased libido    Depression    Heart murmur    Hyperlipidemia    Hypokalemia    Migraine    Nephrolithiasis    Pap smear abnormality of cervix/human papillomavirus (HPV) positive    Sleep difficulties    SVT (supraventricular tachycardia)     Past Surgical History:  Procedure Laterality Date   ABLATION     Cardiac ablation, endometrial ablation   BREAST LUMPECTOMY     right breast   BREAST LUMPECTOMY Right    CARDIOVERSION     COLONOSCOPY WITH PROPOFOL N/A 05/16/2021   Procedure: COLONOSCOPY WITH PROPOFOL;  Surgeon:  Charlott Rakes, MD;  [facility]: [facility];  Service: Endoscopy;  Laterality: N/A;   COLPOSCOPY     w/ cervical biopsy   DILITATION & CURRETTAGE/HYSTROSCOPY WITH NOVASURE ABLATION N/A 09/11/2014   Procedure: DILATATION & CURETTAGE/HYSTEROSCOPY WITH NOVASURE ABLATION;  Surgeon: Lenoard Aden, MD;  Location: [facility];  Service: Gynecology;  Laterality: N/A;   GIVENS CAPSULE STUDY N/A 05/16/2021   Procedure: GIVENS CAPSULE STUDY;  Surgeon: Charlott Rakes, MD;  Location: [facility];  Service: Endoscopy;  Laterality: N/A;   XI ROBOT ASSISTED RECTOPEXY N/A 07/07/2021   Procedure: XI ROBOT ASSISTED RECTOPEXY;  Surgeon: Karie Soda, MD;  Location: [facility];  Service: General;  Laterality: N/A;   XI ROBOTIC ASSISTED LOWER ANTERIOR RESECTION N/A 07/07/2021   Procedure: XI ROBOTIC ASSISTED LOW ANTERIOR RECTOSIGMOID RESECTION WITH RIGID PROCTOSCOPY, TAP BLOCK;  Surgeon: Karie Soda, MD;  Location: [facility];  Service: General;  Laterality: N/A;    There were no vitals filed for this visit.   Subjective Assessment - 05/18/23 0921     Currently in Pain? No/denies    Multiple Pain Sites No                Group Session:  S: Having a really hard time today, but I'm trying.   O: In this group therapy session, the

## 2023-05-21 ENCOUNTER — Other Ambulatory Visit (HOSPITAL_COMMUNITY): Payer: Commercial Managed Care - PPO

## 2023-05-22 ENCOUNTER — Other Ambulatory Visit (HOSPITAL_COMMUNITY): Payer: Commercial Managed Care - PPO

## 2023-05-23 ENCOUNTER — Other Ambulatory Visit (HOSPITAL_BASED_OUTPATIENT_CLINIC_OR_DEPARTMENT_OTHER): Payer: Commercial Managed Care - PPO | Admitting: Licensed Clinical Social Worker

## 2023-05-23 ENCOUNTER — Other Ambulatory Visit (HOSPITAL_COMMUNITY): Payer: Commercial Managed Care - PPO

## 2023-05-23 DIAGNOSIS — Z634 Disappearance and death of family member: Secondary | ICD-10-CM

## 2023-05-24 ENCOUNTER — Other Ambulatory Visit (HOSPITAL_BASED_OUTPATIENT_CLINIC_OR_DEPARTMENT_OTHER): Payer: Commercial Managed Care - PPO

## 2023-05-24 ENCOUNTER — Other Ambulatory Visit (HOSPITAL_COMMUNITY): Payer: Commercial Managed Care - PPO

## 2023-05-24 DIAGNOSIS — F332 Major depressive disorder, recurrent severe without psychotic features: Secondary | ICD-10-CM

## 2023-05-25 ENCOUNTER — Telehealth (HOSPITAL_COMMUNITY): Payer: Self-pay | Admitting: Licensed Clinical Social Worker

## 2023-05-25 ENCOUNTER — Other Ambulatory Visit (HOSPITAL_COMMUNITY): Payer: Commercial Managed Care - PPO

## 2023-05-25 ENCOUNTER — Other Ambulatory Visit (HOSPITAL_COMMUNITY): Payer: Self-pay | Admitting: Psychiatry

## 2023-05-25 DIAGNOSIS — F411 Generalized anxiety disorder: Secondary | ICD-10-CM

## 2023-05-25 DIAGNOSIS — F332 Major depressive disorder, recurrent severe without psychotic features: Secondary | ICD-10-CM

## 2023-05-27 NOTE — Psych (Signed)
Virtual Visit via Video Note  I connected with Emily Park on 05/17/23 at  9:00 AM EDT by a video enabled telemedicine application and verified that I am speaking with the correct person using two identifiers.  Location: Patient: patient home Provider: clinical home office   I discussed the limitations of evaluation and management by telemedicine and the availability of in person appointments. The patient expressed understanding and agreed to proceed.  I discussed the assessment and treatment plan with the patient. The patient was provided an opportunity to ask questions and all were answered. The patient agreed with the plan and demonstrated an understanding of the instructions.   The patient was advised to call back or seek an in-person evaluation if the symptoms worsen or if the condition fails to improve as anticipated.  Pt was provided 240 minutes of non-face-to-face time during this encounter.   Donia Guiles, LCSW   [facility] PHP THERAPIST PROGRESS NOTE  Emily Park [id number]  Session Time: 9:00 - 10:00  Participation Level: Active  Behavioral Response: CasualAlertDepressed  Type of Therapy: Group Therapy  Treatment Goals addressed: Coping  Progress Towards Goals: Initial  Interventions: CBT, DBT, Supportive, and Reframing  Summary: Emily Park is a 52 y.o. female who presents with depression and grief symptoms.  Clinician led check-in regarding current stressors and situation, and review of patient completed daily inventory. Clinician utilized active listening and empathetic response and validated patient emotions. Clinician facilitated processing group on pertinent issues.?    Therapist Response:  Patient arrived within time allowed. Patient rates her mood at a 5 on a scale of 1-10 with 10 being best. Pt states she feels "anxious." Pt states she slept 5 broken hours and ate 1x. Pt reports she has been feeling ill and going to appointments with her husband.  Pt reports the initial tests show the cancer may have spread to his lymph nodes but they are awaiting further tests. Pt is tearful and reports she is "scared" for her husband. Pt reports she ended up not helping clean out her dad's closet as planned because "I don't think I can handle any more right now." Patient able to process. Patient engaged in discussion.            Session Time: 10:00 am - 11:00 am   Participation Level: Active   Behavioral Response: CasualAlertDepressed   Type of Therapy: Group Therapy   Treatment Goals addressed: Coping   Progress Towards Goals: Progressing   Interventions: CBT, DBT, Solution Focused, Strength-based, Supportive, and Reframing   Therapist Response: Cln led discussion on impulsivity. Group discussed struggles with impulsivity. Cln highlighted theme of immediacy. Group built insight around the way immediacy interacts with impulsivity. Cln encouraged pt's to consider mantras and grounding statements to remind themselves there is time to think/feel/act.    Therapist Response:   Pt engaged in discussion and is able to make connections and gain insight.          Session Time: 11:00 -12:00   Participation Level: Active   Behavioral Response: CasualAlertDepressed   Type of Therapy: Group Therapy   Treatment Goals addressed: Coping   Progress Towards Goals: Progressing   Interventions: CBT, DBT, Solution Focused, Strength-based, Supportive, and Reframing   Summary: Cln led discussion on unknowns and the way they impact our anxiety. Cln discussed cognitive restructuring, DBT distraction skills, and positive mantras/prayer as ways to address the anxiety. Cln discussed idea "I can do hard things" and ways to bolster confidence in  our ability to handle difficult situations.    Therapist Response:  Pt shared current unknowns they are dealing with and is able to identify ways to manage.          Session Time: 12:00 -1:00   Participation  Level: Active   Behavioral Response: CasualAlertDepressed   Type of Therapy: Group therapy   Treatment Goals addressed: Coping   Progress Towards Goals: Progressing   Interventions: OT group   Summary: 12:00 - 12:50: Occupational Therapy group with cln E. Hollan.  12:50 - 1:00 Clinician assessed for immediate needs, medication compliance and efficacy, and safety concerns.   Therapist Response: 12:00 - 12:50: See note 12:50 - 1:00 pm: At check-out, patient reports no immediate concerns. Patient demonstrates progress as evidenced by continued engagement and responsiveness to treatment. Patient denies SI/HI/self-harm thoughts at the end of group.    Suicidal/Homicidal: Nowithout intent/plan  Plan: Pt will continue in PHP while working to decrease depression and grief symptoms, increase daily functioning, and increase ability to manage symptoms in a healthy manner.   Collaboration of Care: Medication Management AEB J Cyndie Chime  Patient/Guardian was advised Release of Information must be obtained prior to any record release in order to collaborate their care with an outside provider. Patient/Guardian was advised if they have not already done so to contact the registration department to sign all necessary forms in order for [name] to release information regarding their care.   Consent: Patient/Guardian gives verbal consent for treatment and assignment of benefits for services provided during this visit. Patient/Guardian expressed understanding and agreed to proceed.   Diagnosis: MDD (major depressive disorder), recurrent severe, without psychosis (HCC) [F33.2]    1. MDD (major depressive disorder), recurrent severe, without psychosis (HCC)   2. Bereavement, uncomplicated   3. Generalized anxiety disorder      Donia Guiles, LCSW

## 2023-05-27 NOTE — Psych (Signed)
Virtual Visit via Video Note  I connected with Emily Park on 05/14/23 at  9:00 AM EDT by a video enabled telemedicine application and verified that I am speaking with the correct person using two identifiers.  Location: Patient: patient home Provider: clinical home office   I discussed the limitations of evaluation and management by telemedicine and the availability of in person appointments. The patient expressed understanding and agreed to proceed.  I discussed the assessment and treatment plan with the patient. The patient was provided an opportunity to ask questions and all were answered. The patient agreed with the plan and demonstrated an understanding of the instructions.   The patient was advised to call back or seek an in-person evaluation if the symptoms worsen or if the condition fails to improve as anticipated.  Pt was provided 240 minutes of non-face-to-face time during this encounter.   Donia Guiles, LCSW   [facility] PHP THERAPIST PROGRESS NOTE  Emily Park [id number]  Session Time: 9:00 - 10:00  Participation Level: Active  Behavioral Response: CasualAlertDepressed  Type of Therapy: Group Therapy  Treatment Goals addressed: Coping  Progress Towards Goals: Initial  Interventions: CBT, DBT, Supportive, and Reframing  Summary: Emily Park is a 52 y.o. female who presents with depression and grief symptoms.  Clinician led check-in regarding current stressors and situation, and review of patient completed daily inventory. Clinician utilized active listening and empathetic response and validated patient emotions. Clinician facilitated processing group on pertinent issues.?    Therapist Response:  Patient arrived within time allowed. Patient rates her mood at a 4 on a scale of 1-10 with 10 being best. Pt states she feels "sad and anxious." Pt states she slept 5 broken hours and ate 1x. Pt reports she is struggling with the grief of her dad's death by  suicide. Pt states the weekend was difficult because she went to his house to help clean/pack. Pt reports additional stressor of her husband having a tumor in his esophagus that they are currently testing. Pt reports an appointment with the oncologist tomorrow. Pt reports feeling high anxiety and "raw" "all the time." Pt is tearful throughout session. Patient able to process. Patient engaged in discussion.            Session Time: 10:00 am - 11:00 am   Participation Level: Active   Behavioral Response: CasualAlertDepressed   Type of Therapy: Group Therapy   Treatment Goals addressed: Coping   Progress Towards Goals: Progressing   Interventions: CBT, DBT, Solution Focused, Strength-based, Supportive, and Reframing   Therapist Response: Cln led processing group for pt's current struggles. Group members shared stressors and provided support and feedback. Cln brought in topics of boundaries, healthy relationships, and unhealthy thought processes to inform discussion.    Therapist Response:  Pt able to process and provide support to group.            Session Time: 11:00 -12:00   Participation Level: Active   Behavioral Response: CasualAlertDepressed   Type of Therapy: Group Therapy   Treatment Goals addressed: Coping   Progress Towards Goals: Progressing   Interventions: CBT, DBT, Solution Focused, Strength-based, Supportive, and Reframing   Summary: Cln continued topic of DBT distress tolerance skills. Cln introduced Self-Soothe skills. Group discussed ways they can utilize the five senses to soothe themselves when struggling.    Therapist Response: Pt engaged in discussion and identifies way to utilize the skill.          Session Time: 12:00 -  1:00   Participation Level: Active   Behavioral Response: CasualAlertDepressed   Type of Therapy: Group therapy   Treatment Goals addressed: Coping   Progress Towards Goals: Progressing   Interventions: OT group    Summary: 12:00 - 12:50: Occupational Therapy group with cln E. Hollan.  12:50 - 1:00 Clinician assessed for immediate needs, medication compliance and efficacy, and safety concerns.   Therapist Response: 12:00 - 12:50: See note 12:50 - 1:00 pm: At check-out, patient reports no immediate concerns. Patient demonstrates progress as evidenced by participating in first group session. Patient denies SI/HI/self-harm thoughts at the end of group.    Suicidal/Homicidal: Nowithout intent/plan  Plan: Pt will continue in PHP while working to decrease depression and grief symptoms, increase daily functioning, and increase ability to manage symptoms in a healthy manner.   Collaboration of Care: Medication Management AEB J Cyndie Chime  Patient/Guardian was advised Release of Information must be obtained prior to any record release in order to collaborate their care with an outside provider. Patient/Guardian was advised if they have not already done so to contact the registration department to sign all necessary forms in order for [name] to release information regarding their care.   Consent: Patient/Guardian gives verbal consent for treatment and assignment of benefits for services provided during this visit. Patient/Guardian expressed understanding and agreed to proceed.   Diagnosis: MDD (major depressive disorder), recurrent severe, without psychosis (HCC) [F33.2]    1. MDD (major depressive disorder), recurrent severe, without psychosis (HCC)   2. Generalized anxiety disorder   3. Migraine without status migrainosus, not intractable, unspecified migraine type   4. Bereavement, uncomplicated   5. GAD (generalized anxiety disorder)      Donia Guiles, LCSW

## 2023-05-28 ENCOUNTER — Other Ambulatory Visit (HOSPITAL_COMMUNITY): Payer: Commercial Managed Care - PPO | Attending: Psychiatry

## 2023-05-28 ENCOUNTER — Encounter (HOSPITAL_COMMUNITY): Payer: Self-pay

## 2023-05-28 ENCOUNTER — Telehealth (HOSPITAL_COMMUNITY): Payer: Self-pay | Admitting: Professional

## 2023-05-28 NOTE — Psych (Signed)
Pt did not attend PHP due to illness.

## 2023-05-28 NOTE — Psych (Signed)
Pt did not attend PHP session due to physical illness.

## 2023-05-28 NOTE — Progress Notes (Unsigned)
BH MD/PA/NP OP Progress Note  05/28/2023 10:36 AM Emily Park  MRN:  [id number]  Visit Diagnosis:  No diagnosis found.   Assessment: Emily Park is a 52 y.o. female with a history of MDD with suicide attempt and subsequent hospitalization in May 2024, GAD, reported ADHD, and IBS who presented to [facility] at [facility] for initial evaluation on 03/27/2023.  At initial evaluation patient reported struggling with neurovegetative symptoms of depression including low mood, anhedonia, amotivation, disturbed sleep, hopelessness, feelings of worthlessness, and negative self thoughts.  She denied any SI/HI or thoughts of self-harm though had acted on suicidal thoughts in May 2024.  Crisis resources were reviewed and patient has completed safety planning.  Medications are managed by her husband and she is able to list a number of supports she can reach out to if needed.  Patient also struggled with symptoms of anxiety including excessive worry, fear of something awful happening, restlessness, palpitations, chest pressure, and shortness of breath.  At times the anxiety can progress to panic episodes.  Patient does have a past trauma history from which she experiences nightmares and hypervigilance though she denies any other symptoms consistent with PTSD.  Also of note she has been prescribed high-dose benzodiazepines for over a decade and is in the process of tapering off of them.  Psychosocially patient has good supports in husband, family, and friends.  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  There is increased financial stressors at time of initial evaluation.  Patient met criteria for MDD and GAD.  She did have a former diagnosis of ADHD this could be complicated by her other underlying psychiatric disorders and further neurological testing would be appropriate.  Emily Park presents for follow-up evaluation. Today,  05/28/23, patient reports    increased depression, anxiety, insomnia and mood lability following the loss of her father to suicide last week.  She has had improvement in affect compared to phone conversation last week.  Despite this patient does still present with significant concern for depression.  She would benefit from increased support by IOP referral and plans to also start group counseling in the near future.  In regards to medication she has had some benefit from the increase in Klonopin.  We will continue the 7-day increase as initially planned which will finish tomorrow and then return back to 1 mg daily.  Patient had discontinued doxepin in the interim due to lack of perceived benefit.  This is likely secondary to the increased stress she is currently experiencing.  We will start clonidine which she has taken with good benefit in the past and mirtazapine today for mood, sleep, and appetite symptoms.  Patient will follow up in a month.  Plan: - Discontinued Trintellix  - Continue Zoloft 100 mg daily - Start Mirtazapine 7.5 mg QHS - Continue Klonopin 1 mg BID for one more day before decreasing to 1 mg daily - Start Clonidine 0.1 mg at bedtime - Continue Topamax 100 mg in the morning for migraines - Discontinue Doxepin 75 mg nightly, no benefit - Discontinue Prazosin, concern about dizziness and thus did not start - Start grief counseling  - Refer to IOP - Provided info about group therapy options like [facility] and [facility] - Neuropsysch testing referral - Crisis resources reviewed - Follow up in a month   Chief Complaint:  No chief complaint on file.  HPI:  Emily Park started PHP on 10/16 however had a number of absences following this.    had called in the interim reporting increased depression after her father committed suicide. Provider sent in a 7 day script of Klonopin 1 mg BID which patient will finish tomorrow.  Patient presented today accompanied by her husband.   She reports that she is still really struggling with grief and depression following the loss of her father.  Patient notes that she has been intermittently tearful, with worsening insomnia, decreased motivation, decreased appetite, and fatigue.  She denies any SI or thoughts of self-harm.  She can also have periods of mood lability where she is more anxious, irritable, or euthymic.  Of note patient does still have labile affect however less so compared to her phone conversation with this provider last week.  She has been able to identify some of the positives and happy times that she spent with her father instead of focusing on the negatives and recent suicide.  Patient also no longer endorsed thoughts of self blame for his suicide.  Patient's husband reports that she has had mood swings ever since the event.  He had to go back to work yesterday and is really worried about her on her own.  He found that while she had improved at the IOP program there had already been a decline even before this event.   We discussed the importance of social support during this time and the benefit of not spending excessive amounts of time on her own.  Patient did acknowledge she feels better when she has people around her.  We discussed the option of returning to the IOP program which she was interested in.  In regards to medication she did feel the increase in Klonopin was helpful and had been interested in continuing with twice a day dosing.  After reviewing potential risk and negative long-term effects patient was open to continuing the 7-day prescription as planned before switching back to Klonopin 1 mg daily.  In regards to sleep she discontinued doxepin due to lack of benefit.  It is possible that this is secondary to the increased grief she is currently experiencing.  Patient was interested in restarting clonidine which she had taken in the past for sleep with good effect.  We reviewed the risk and benefits especially in  relation to the dizziness as she had this this is a concern about prazosin.  Patient expressed her understanding and was not concerned that she had not experienced this side effect in the past.  We also discussed starting on mirtazapine to help stimulate appetite and sleep in addition to improving depression.  In addition patient will start grief counseling in the near future.  Patient's husband had asked about mood stabilizers and why we can consider those in the future they do not appear to be indicated at this time.  Her current mood lability appears to be more in relation to recent stressors and working through the stages of grief.  Past Psychiatric History: Patient was admitted to [facility] in 12/2022.  After which she stepped down to PHP on 01/16/2023 followed by IOP on 02/08/2023.  Patient had been admitted to the hospital following a suicide attempt by insulin overdose.  Prior to this she had no other suicide attempts or episodes of self-harm. OPT: Dr. Evelene Croon 10+ years Therapist: [facility] 2x, but no insurance coverage, appointment scheduled with [facility] Previous: Klonopin 1mg  QHS and doxepin 75mg  QHS, patient has been on Klonopin  10 years, they were rx Klonopin 1mg , TID but she gets sedated so was only taking BID/QHS until around 09/2022, Abilify, Prozac (worked then pooped out after 20+ years), Trintellix, doxepin ( Xanax, Wellbutrin ( failed), BuSar (ineffective), Atarax (ineffective), Ambien ( behavior changes, spending money suddenly). Trazodone did not help with sleep.   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  Patient is being tapered off of Klonopin now.  She denies any current ethanol use though had in the past.  Patient discontinued due to developing stomach ulcers.  She denies any THC, tobacco, or other substance use.  Past Medical History:  Past Medical History:  Diagnosis Date   Abnormal TSH    ADD  (attention deficit disorder)    Anemia    Anxiety    Arrhythmia    Decreased libido    Depression    Heart murmur    Hyperlipidemia    Hypokalemia    Migraine    Nephrolithiasis    Pap smear abnormality of cervix/human papillomavirus (HPV) positive    Sleep difficulties    SVT (supraventricular tachycardia)     Past Surgical History:  Procedure Laterality Date   ABLATION     Cardiac ablation, endometrial ablation   BREAST LUMPECTOMY     right breast   BREAST LUMPECTOMY Right    CARDIOVERSION     COLONOSCOPY WITH PROPOFOL N/A 05/16/2021   Procedure: COLONOSCOPY WITH PROPOFOL;  Surgeon: Charlott Rakes, MD;  Location: [facility];  Service: Endoscopy;  Laterality: N/A;   COLPOSCOPY     w/ cervical biopsy   DILITATION & CURRETTAGE/HYSTROSCOPY WITH NOVASURE ABLATION N/A 09/11/2014   Procedure: DILATATION & CURETTAGE/HYSTEROSCOPY WITH NOVASURE ABLATION;  Surgeon: Lenoard Aden, MD;  Location: [facility];  Service: Gynecology;  Laterality: N/A;   GIVENS CAPSULE STUDY N/A 05/16/2021   Procedure: GIVENS CAPSULE STUDY;  Surgeon: Charlott Rakes, MD;  Location: [facility];  Service: Endoscopy;  Laterality: N/A;   XI ROBOT ASSISTED RECTOPEXY N/A 07/07/2021   Procedure: XI ROBOT ASSISTED RECTOPEXY;  Surgeon: Karie Soda, MD;  Location: [facility];  Service: General;  Laterality: N/A;   XI ROBOTIC ASSISTED LOWER ANTERIOR RESECTION N/A 07/07/2021   Procedure: XI ROBOTIC ASSISTED LOW ANTERIOR RECTOSIGMOID RESECTION WITH RIGID PROCTOSCOPY, TAP BLOCK;  Surgeon: Karie Soda, MD;  Location: [facility];  Service: General;  Laterality: N/A;   Family History:  Family History  Problem Relation Age of Onset   Hypothyroidism Mother    Depression Mother    Cancer Mother        thyroid   Thyroid disease Mother    Alcohol abuse Father    Hypertension Father    Heart attack Father        Age 6   AAA (abdominal aortic aneurysm) Father    Depression Sister    Hypothyroidism Sister     Hyperthyroidism Sister    Diabetes Maternal Grandfather    Depression Maternal Grandmother    Breast cancer Paternal Grandmother    Depression Cousin     Social History:  Social History   Socioeconomic History   Marital status: Married    Spouse name: Not on file   Number of children: 2   Years of education: college   Highest education level: Associate degree: academic program  Occupational History   Occupation: Charity fundraiser with Engineer, manufacturing systems: [employer]   Tobacco Use   Smoking status: Never  Passive exposure: Current   Smokeless tobacco: Never  Vaping Use   Vaping status: Never Used  Substance and Sexual Activity   Alcohol use: Not Currently    Alcohol/week: 2.0 standard drinks of alcohol    Types: 2 Glasses of wine per week   Drug use: Not Currently   Sexual activity: Yes    Partners: Male    Comment: Married  Other Topics Concern   Not on file  Social History Narrative   Lives at home with two sons.   Right-handed.   60 ounces of tea and soda per day.   Social Determinants of Health   Financial Resource Strain: Low Risk  (10/16/2018)   Received from [facility] visits prior to 10/28/2022., [facility] visits prior to 10/28/2022.   Overall Financial Resource Strain (CARDIA)    Difficulty of Paying Living Expenses: Not hard at all  Food Insecurity: Food Insecurity Present (12/28/2022)   Hunger Vital Sign    Worried About Running Out of Food in the Last Year: Sometimes true    Ran Out of Food in the Last Year: Sometimes true  Transportation Needs: No Transportation Needs (12/28/2022)   PRAPARE - Administrator, Civil Service (Medical): No    Lack of Transportation (Non-Medical): No  Physical Activity: Not on file  Stress: Not on file  Social Connections: Unknown (01/12/2022)   [facility] from [facility]   Social Network    Social Network: Not on file    Allergies:  Allergies  Allergen Reactions    Zithromax [Azithromycin] Rash    Current Medications: Current Outpatient Medications  Medication Sig Dispense Refill   ACCRUFER 30 MG CAPS Take 1 capsule (30 mg total) by mouth 2 (two) times daily. 90 capsule 0   Cholecalciferol (VITAMIN D3) 50 MCG (2000 UT) capsule Take 2,000 Units by mouth daily.     clonazePAM (KLONOPIN) 1 MG disintegrating tablet Take 1 tablet (1 mg total) by mouth at bedtime. 30 tablet 2   cloNIDine (CATAPRES) 0.1 MG tablet TAKE 1 TABLET BY MOUTH EVERY EVENING. 30 tablet 2   hydrocortisone (ANUSOL-HC) 2.5 % rectal cream Apply 1 Application topically 4 (four) times daily as needed for hemorrhoids. 30 g 0   linaclotide (LINZESS) 145 MCG CAPS capsule Take 1 capsule (145 mcg total) by mouth daily before breakfast. 30 capsule 0   loratadine (CLARITIN) 10 MG tablet Take 1 tablet (10 mg total) by mouth daily as needed for allergies. (Patient not taking: Reported on 01/19/2023) 30 tablet 0   mirtazapine (REMERON) 7.5 MG tablet Take 1 tablet (7.5 mg total) by mouth at bedtime. 30 tablet 2   Multiple Vitamin (MULTIVITAMIN WITH MINERALS) TABS tablet Take 1 tablet by mouth every morning.     pantoprazole (PROTONIX) 40 MG tablet Take 1 tablet (40 mg total) by mouth 2 (two) times daily. 180 tablet 3   polyethylene glycol (MIRALAX / GLYCOLAX) 17 g packet Take 17 g by mouth 2 (two) times daily. (Patient taking differently: Take 17 g by mouth daily as needed for moderate constipation.) 14 each 0   polyvinyl alcohol (LIQUIFILM TEARS) 1.4 % ophthalmic solution Place 1 drop into both eyes as needed for dry eyes. 15 mL 0   potassium chloride SA (KLOR-CON M20) 20 MEQ tablet Take 1 tablet (20 mEq total) by mouth 2 (two) times daily. 180 tablet 1   rizatriptan (MAXALT) 10 MG tablet PLEASE SEE ATTACHED FOR DETAILED DIRECTIONS  sertraline (ZOLOFT) 100 MG tablet Take 1.5 tablets (150 mg total) by mouth daily. 45 tablet 0   Sodium Fluoride (PREVIDENT 5000 BOOSTER PLUS) 1.1 % PSTE As directed tid 100  mL 3   topiramate (TOPAMAX) 100 MG tablet Take 1 tablet (100 mg total) by mouth daily. 90 tablet 1   No current facility-administered medications for this visit.     Psychiatric Specialty Exam: Review of Systems  There were no vitals taken for this visit.There is no height or weight on file to calculate BMI.  General Appearance: Disheveled  Eye Contact:  Fair  Speech:  Clear and Coherent  Volume:  Normal  Mood:  Depressed and Dysphoric  Affect:  Congruent, Labile, Full Range, and Tearful  Thought Process:  Coherent  Orientation:  Full (Time, Place, and Person)  Thought Content: Logical, Illogical, and Rumination   Suicidal Thoughts:  No  Homicidal Thoughts:  No  Memory:  Immediate;   Fair  Judgement:  Fair  Insight:  Fair  Psychomotor Activity:  Decreased and Restlessness  Concentration:  Concentration: Fair  Recall:  Fair  Fund of Knowledge: Fair  Language: Good  Akathisia:  No    AIMS (if indicated): not done  Assets:  Communication Skills Desire for Improvement Financial Resources/Insurance Housing Social Support  ADL's:  Intact  Cognition: WNL  Sleep:  Fair   Metabolic Disorder Labs: Lab Results  Component Value Date   HGBA1C 4.9 12/20/2022   MPG 93.93 12/20/2022   MPG 93.93 07/06/2021   No results found for: "PROLACTIN" Lab Results  Component Value Date   CHOL 208 (H) 02/24/2014   TRIG 81.0 02/24/2014   HDL 54.90 02/24/2014   CHOLHDL 4 02/24/2014   VLDL 16.2 02/24/2014   LDLCALC 137 (H) 02/24/2014   Lab Results  Component Value Date   TSH 0.18 (L) 03/20/2023   TSH 0.175 (L) 12/21/2022    Therapeutic Level Labs: No results found for: "LITHIUM" No results found for: "VALPROATE" No results found for: "CBMZ"   Screenings: AIMS    Flowsheet Row [facility]  AIMS Total Score 0      AUDIT    Flowsheet Row [facility] ([facility]  Alcohol Use Disorder Identification Test Final Score (AUDIT) 1      GAD-7    Flowsheet Row Office Visit from 03/20/2023 in [facility] [place] Counselor from 01/09/2023 in [facility] Office Visit from 11/22/2022 in [facility]  Total GAD-7 Score 7 15 12       PHQ2-9    Flowsheet Row Counselor from 05/03/2023 in [facility] Office Visit from 03/20/2023 in [facility] [place] Counselor from 02/05/2023 in [facility] Counselor from 01/19/2023 in [facility] PROGRAM Counselor from 01/09/2023 in [facility] PRO[facility]  PHQ-2 Total Score 4 4 1 2 2   PHQ-9 Total Score 14 14 2 11 12       Flowsheet Row Counselor from 05/03/2023 in [facility] PRO[facility] Office Visit from [facility]/2023 [facility] Counselor from 01/19/2023 in [facility] PROGRAM  C-SSRS RISK CATEGORY Error: Question 2 not populated Moderate Risk Error: Question 6 not populated       Collaboration of Care: Collaboration of Care: Medication Management AEB medication prescription and Referral  or follow-up with counselor/therapist AEB PHP chart review  Patient/Guardian was advised Release of Information must be obtained prior to any record release in order to collaborate their care with an outside provider. Patient/Guardian was advised if they have not already done so to contact the registration department to sign all necessary forms in order for [name] to release information regarding their care.   Consent: Patient/Guardian gives verbal consent for treatment and assignment of benefits for services provided during this visit. Patient/Guardian expressed understanding and  agreed to proceed.    Stasia Cavalier, MD 05/28/2023, 10:36 AM   Stasia Cavalier, MD

## 2023-05-29 ENCOUNTER — Telehealth (HOSPITAL_BASED_OUTPATIENT_CLINIC_OR_DEPARTMENT_OTHER): Payer: Commercial Managed Care - PPO | Admitting: Psychiatry

## 2023-05-29 ENCOUNTER — Other Ambulatory Visit (HOSPITAL_COMMUNITY): Payer: Commercial Managed Care - PPO | Attending: Psychiatry

## 2023-05-29 DIAGNOSIS — F332 Major depressive disorder, recurrent severe without psychotic features: Secondary | ICD-10-CM | POA: Insufficient documentation

## 2023-05-29 DIAGNOSIS — F411 Generalized anxiety disorder: Secondary | ICD-10-CM | POA: Diagnosis not present

## 2023-05-29 DIAGNOSIS — Z79899 Other long term (current) drug therapy: Secondary | ICD-10-CM | POA: Diagnosis not present

## 2023-05-29 DIAGNOSIS — R4589 Other symptoms and signs involving emotional state: Secondary | ICD-10-CM | POA: Insufficient documentation

## 2023-05-29 MED ORDER — SERTRALINE HCL 100 MG PO TABS: 150.0000 mg | ORAL_TABLET | Freq: Every day | ORAL | 0 refills | Status: DC

## 2023-05-29 NOTE — Progress Notes (Signed)
BH MD/PA/NP OP Progress Note  05/29/2023 1:00 PM Emily Park  MRN:  [id number]  Visit Diagnosis:    ICD-10-CM   1. MDD (major depressive disorder), recurrent severe, without psychosis (HCC)  F33.2 sertraline (ZOLOFT) 100 MG tablet    2. Generalized anxiety disorder  F41.1 sertraline (ZOLOFT) 100 MG tablet    3. Long-term current use of benzodiazepine  Z79.899        Assessment: Emily Park is a 52 y.o. female with a history of MDD with suicide attempt and subsequent hospitalization in May 2024, GAD, reported ADHD, and IBS who presented to [facility] for initial evaluation on 03/27/2023.  At initial evaluation patient reported struggling with neurovegetative symptoms of depression including low mood, anhedonia, amotivation, disturbed sleep, hopelessness, feelings of worthlessness, and negative self thoughts.  She denied any SI/HI or thoughts of self-harm though had acted on suicidal thoughts in May 2024.  Crisis resources were reviewed and patient has completed safety planning.  Medications are managed by her husband and she is able to list a number of supports she can reach out to if needed.  Patient also struggled with symptoms of anxiety including excessive worry, fear of something awful happening, restlessness, palpitations, chest pressure, and shortness of breath.  At times the anxiety can progress to panic episodes.  Patient does have a past trauma history from which she experiences nightmares and hypervigilance though she denies any other symptoms consistent with PTSD.  Also of note she has been prescribed high-dose benzodiazepines for over a decade and is in the process of tapering off of them.  Psychosocially patient has good supports in husband, family, and friends.  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  There is increased financial stressors at time of initial evaluation.  Patient met  criteria for MDD and GAD.  She did have a former diagnosis of ADHD this could be complicated by her other underlying psychiatric disorders and further neurological testing would be appropriate.  Emily Park presents for follow-up evaluation. Today, 05/29/23, patient reports further psychosocial stressors after finding out her husband has esophageal cancer.  This left her feeling more overwhelmed and hopeless about the situation and she missed a few days of PHP.  Following this patient endorsed feelings of guilt and embarrassment of missing days and has been fearful to return to the program.  Instead she has isolated which has led to increased depression, ruminations, and negative self thoughts.  While she has some passive thoughts of SI she denies any active plan or intent.  Her husband still has control medications and patient reports being able to reach out to crisis resources if needed.  We will continue on her current regimen including the recent increase of sertraline to 150 mg that was done partial.  Patient denies any adverse side effects from the medications.  She was encouraged to restart the partial program and plans to contact staff.  PHP staff was also contacted about patient's intention to return.  Plan:  - Continue Zoloft 150 mg daily - Continue Mirtazapine 7.5 mg QHS - Continue Klonopin 1 mg daily 1 mg daily - Continue Clonidine 0.1 mg at bedtime - Continue Topamax 100 mg in the morning for migraines - Discontinue Prazosin, concern about dizziness and thus did not start - Start grief counseling  - Refer to PHP/IOP - Provided info about group therapy options like  [facility] and the [facility] - Neuropsysch testing referral - Crisis resources reviewed - Follow up in a month   Chief Complaint:  Chief Complaint  Patient presents with   Follow-up   HPI:  Emily Park started PHP on 9/16 however had a number of absences following this.  At initial evaluation Zoloft was increased to  150 mg daily which patient has been taking consistently.  On presentation today we discussed the events of the interim and patient notes that while she had started PHP shortly afterwards her husband was diagnosed with esophageal cancer.  The addition of another stressor and then being overwhelming and made Emily Park question what she was doing.  She was attempting to accompany her husband all his appointments though this left her feeling like she did not have it in her to keep up with partial.  After missing the first couple days for appointments patient was afraid to return to partial or speak with any of the staff that she was embarrassed for her lack of attendance.  Instead patient has been isolated at home and feels like she is slowly getting worse.  She feels like she is mentally stuck and just is drifting.  She wants to be here at feel to help her husband but notes that she is still having trouble caring for herself.  She does deny any active SI especially after her father's recent suicide, as she could not imagine doing that to her family.  Patient's husband has control of her medications and they are locked in a safe.  Emily Park is aware of crisis resources and reports that she could reach out and present to urgent care if suicidality were to become more acute.  In regards to medication Emily Park has noticed some improvement with sleep with the addition of clonidine though can still experience ruminations and excessive worry when lying in bed.  She is unsure about benefit from mirtazapine other than maybe a slight increase in appetite.  We discussed the sertraline increases made during PHP and patient notes that she is unsure of any positives though does not identify any negatives either.  In regards to [facility] we discussed how patient is still struggle with significant depression and could benefit from increased support.  She does agree and acknowledges that she is only gotten worse with the isolation recently.  She was  agreeable to reach back out the [facility] about restarting and PHP staff was also contacted by the provider.  Past Psychiatric History: Patient was admitted to [facility] in 12/2022.  After which she stepped down to PHP on 01/16/2023 followed by IOP on 02/08/2023.  Patient had been admitted to the hospital following a suicide attempt by insulin overdose.  Prior to this she had no other suicide attempts or episodes of self-harm. OPT: Dr. Evelene Croon 10+ years Therapist: [facility] 2x, but no insurance coverage, appointment scheduled with [facility] Previous: Klonopin 1mg  QHS and doxepin 75mg  QHS, patient has been on Klonopin 10 years, they were rx Klonopin 1mg , TID but she gets sedated so was only taking BID/QHS until around 09/2022, Abilify, Prozac (worked then pooped out after 20+ years), Trintellix, doxepin (poor benefit), ( Xanax, Wellbutrin ( failed), BuSar (ineffective), Atarax (ineffective), Ambien ( behavior changes, spending money suddenly). Trazodone did not help with sleep.   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  Patient is being tapered off of Klonopin now.  She denies any current ethanol use  though had in the past.  Patient discontinued due to developing stomach ulcers.  She denies any THC, tobacco, or other substance use.  Past Medical History:  Past Medical History:  Diagnosis Date   Abnormal TSH    ADD (attention deficit disorder)    Anemia    Anxiety    Arrhythmia    Decreased libido    Depression    Heart murmur    Hyperlipidemia    Hypokalemia    Migraine    Nephrolithiasis    Pap smear abnormality of cervix/human papillomavirus (HPV) positive    Sleep difficulties    SVT (supraventricular tachycardia)     Past Surgical History:  Procedure Laterality Date   ABLATION     Cardiac ablation, endometrial ablation   BREAST LUMPECTOMY     right breast   BREAST LUMPECTOMY Right    CARDIOVERSION      COLONOSCOPY WITH PROPOFOL N/A 05/16/2021   Procedure: COLONOSCOPY WITH PROPOFOL;  Surgeon: Charlott Rakes, MD;  Location: [facility];  Service: Endoscopy;  Laterality: N/A;   COLPOSCOPY     w/ cervical biopsy   DILITATION & CURRETTAGE/HYSTROSCOPY WITH NOVASURE ABLATION N/A 09/11/2014   Procedure: DILATATION & CURETTAGE/HYSTEROSCOPY WITH NOVASURE ABLATION;  Surgeon: Lenoard Aden, MD;  Location: [facility];  Service: Gynecology;  Laterality: N/A;   GIVENS CAPSULE STUDY N/A 05/16/2021   Procedure: GIVENS CAPSULE STUDY;  Surgeon: Charlott Rakes, MD;  Location: [facility];  Service: Endoscopy;  Laterality: N/A;   XI ROBOT ASSISTED RECTOPEXY N/A 07/07/2021   Procedure: XI ROBOT ASSISTED RECTOPEXY;  Surgeon: Karie Soda, MD;  Location: [facility];  Service: General;  Laterality: N/A;   XI ROBOTIC ASSISTED LOWER ANTERIOR RESECTION N/A 07/07/2021   Procedure: XI ROBOTIC ASSISTED LOW ANTERIOR RECTOSIGMOID RESECTION WITH RIGID PROCTOSCOPY, TAP BLOCK;  Surgeon: Karie Soda, MD;  Location: [facility];  Service: General;  Laterality: N/A;   Family History:  Family History  Problem Relation Age of Onset   Hypothyroidism Mother    Depression Mother    Cancer Mother        thyroid   Thyroid disease Mother    Alcohol abuse Father    Hypertension Father    Heart attack Father        Age 28   AAA (abdominal aortic aneurysm) Father    Depression Sister    Hypothyroidism Sister    Hyperthyroidism Sister    Diabetes Maternal Grandfather    Depression Maternal Grandmother    Breast cancer Paternal Grandmother    Depression Cousin     Social History:  Social History   Socioeconomic History   Marital status: Married    Spouse name: Not on file   Number of children: 2   Years of education: college   Highest education level: Associate degree: academic program  Occupational History   Occupation: Charity fundraiser with Engineer, manufacturing systems: [employer]   Tobacco Use   Smoking status: Never    Passive exposure: Current    Smokeless tobacco: Never  Vaping Use   Vaping status: Never Used  Substance and Sexual Activity   Alcohol use: Not Currently    Alcohol/week: 2.0 standard drinks of alcohol    Types: 2 Glasses of wine per week   Drug use: Not Currently   Sexual activity: Yes    Partners: Male    Comment: Married  Other Topics Concern   Not on file  Social History Narrative   Lives at home with two sons.  Right-handed.   60 ounces of tea and soda per day.   Social Determinants of Health   Financial Resource Strain: Low Risk  (10/16/2018)   [facility] from [facility] visits prior to 10/28/2022., [facility] visits prior to 10/28/2022.   Overall Financial Resource Strain (CARDIA)    Difficulty of Paying Living Expenses: Not hard at all  Food Insecurity: Food Insecurity Present (12/28/2022)   Hunger Vital Sign    Worried About Running Out of Food in the Last Year: Sometimes true    Ran Out of Food in the Last Year: Sometimes true  Transportation Needs: No Transportation Needs (12/28/2022)   PRAPARE - Administrator, Civil Service (Medical): No    Lack of Transportation (Non-Medical): No  Physical Activity: Not on file  Stress: Not on file  Social Connections: Unknown (01/12/2022)   [facility] from [facility]   Social Network    Social Network: Not on file    Allergies:  Allergies  Allergen Reactions   Zithromax [Azithromycin] Rash    Current Medications: Current Outpatient Medications  Medication Sig Dispense Refill   ACCRUFER 30 MG CAPS Take 1 capsule (30 mg total) by mouth 2 (two) times daily. 90 capsule 0   Cholecalciferol (VITAMIN D3) 50 MCG (2000 UT) capsule Take 2,000 Units by mouth daily.     clonazePAM (KLONOPIN) 1 MG disintegrating tablet Take 1 tablet (1 mg total) by mouth at bedtime. 30 tablet 2   cloNIDine (CATAPRES) 0.1 MG tablet TAKE 1 TABLET BY MOUTH EVERY EVENING. 30 tablet 2   hydrocortisone (ANUSOL-HC)  2.5 % rectal cream Apply 1 Application topically 4 (four) times daily as needed for hemorrhoids. 30 g 0   linaclotide (LINZESS) 145 MCG CAPS capsule Take 1 capsule (145 mcg total) by mouth daily before breakfast. 30 capsule 0   loratadine (CLARITIN) 10 MG tablet Take 1 tablet (10 mg total) by mouth daily as needed for allergies. (Patient not taking: Reported on 01/19/2023) 30 tablet 0   mirtazapine (REMERON) 7.5 MG tablet Take 1 tablet (7.5 mg total) by mouth at bedtime. 30 tablet 2   Multiple Vitamin (MULTIVITAMIN WITH MINERALS) TABS tablet Take 1 tablet by mouth every morning.     pantoprazole (PROTONIX) 40 MG tablet Take 1 tablet (40 mg total) by mouth 2 (two) times daily. 180 tablet 3   polyethylene glycol (MIRALAX / GLYCOLAX) 17 g packet Take 17 g by mouth 2 (two) times daily. (Patient taking differently: Take 17 g by mouth daily as needed for moderate constipation.) 14 each 0   polyvinyl alcohol (LIQUIFILM TEARS) 1.4 % ophthalmic solution Place 1 drop into both eyes as needed for dry eyes. 15 mL 0   potassium chloride SA (KLOR-CON M20) 20 MEQ tablet Take 1 tablet (20 mEq total) by mouth 2 (two) times daily. 180 tablet 1   rizatriptan (MAXALT) 10 MG tablet PLEASE SEE ATTACHED FOR DETAILED DIRECTIONS     sertraline (ZOLOFT) 100 MG tablet Take 1.5 tablets (150 mg total) by mouth daily. 45 tablet 0   Sodium Fluoride (PREVIDENT 5000 BOOSTER PLUS) 1.1 % PSTE As directed tid 100 mL 3   topiramate (TOPAMAX) 100 MG tablet Take 1 tablet (100 mg total) by mouth daily. 90 tablet 1   No current facility-administered medications for this visit.     Psychiatric Specialty Exam: Review of Systems  There were no vitals taken for this visit.There is no height or weight on file  to calculate BMI.  General Appearance: Disheveled  Eye Contact:  Fair  Speech:  Clear and Coherent  Volume:  Normal  Mood:  Depressed and Dysphoric  Affect:  Congruent, Labile, Full Range, and Tearful  Thought Process:  Coherent   Orientation:  Full (Time, Place, and Person)  Thought Content: Logical, Illogical, and Rumination   Suicidal Thoughts:  No  Homicidal Thoughts:  No  Memory:  Immediate;   Fair  Judgement:  Fair  Insight:  Fair  Psychomotor Activity:  Decreased and Restlessness  Concentration:  Concentration: Fair  Recall:  Fair  Fund of Knowledge: Fair  Language: Good  Akathisia:  No    AIMS (if indicated): not done  Assets:  Communication Skills Desire for Improvement Financial Resources/Insurance Housing Social Support  ADL's:  Intact  Cognition: WNL  Sleep:  Fair   Metabolic Disorder Labs: Lab Results  Component Value Date   HGBA1C 4.9 12/20/2022   MPG 93.93 12/20/2022   MPG 93.93 07/06/2021   No results found for: "PROLACTIN" Lab Results  Component Value Date   CHOL 208 (H) 02/24/2014   TRIG 81.0 02/24/2014   HDL 54.90 02/24/2014   CHOLHDL 4 02/24/2014   VLDL 16.2 02/24/2014   LDLCALC 137 (H) 02/24/2014   Lab Results  Component Value Date   TSH 0.18 (L) 03/20/2023   TSH 0.175 (L) 12/21/2022    Therapeutic Level Labs: No results found for: "LITHIUM" No results found for: "VALPROATE" No results found for: "CBMZ"   Screenings: AIMS    Flowsheet Row [facility]  AIMS Total Score 0      AUDIT    Flowsheet Row Ad[facility] (Dis[facility]  Alcohol Use Disorder Identification Test Final Score (AUDIT) 1      GAD-7    Flowsheet Row Office Visit from 03/20/2023 in [facility] [place] Counselor from 01/09/2023 in [facility] Office Visit from 11/22/2022 in [facility]  Total GAD-7 Score 7 15 12       PHQ2-9    Flowsheet Row Counselor from 05/03/2023 in [facility] Office Visit from 03/20/2023 in [facility] [place] Counselor from 02/05/2023 in [facility] PROGRAM Counselor from 01/19/2023 in [facility] PROGRAM Counselor from 01/09/2023 in [facility] PROGRAM  PHQ-2 Total Score 4 4 1 2 2   PHQ-9 Total Score 14 14 2 11 12       Flowsheet Row Counselor from 05/03/2023 in [facility] Office [facility] Counselor from 01/19/2023 in [facility]  C-SSRS RISK CATEGORY Error: Question 2 not populated Moderate Risk Error: Question 6 not populated       Collaboration of Care: Collaboration of Care: Medication Management AEB medication prescription, Other provider involved in patient's care AEB internal med chart review, and Referral or follow-up with counselor/therapist AEB IOP referral  Patient/Guardian was advised Release of Information must be obtained prior to any record release in order to collaborate their care with an outside provider. Patient/Guardian was advised if they have not already done so to contact the registration department to sign all necessary forms in order for [name] to release information regarding their care.   Consent: Patient/Guardian gives verbal consent for treatment and assignment of benefits for services  provided during this visit. Patient/Guardian expressed understanding and agreed to proceed.    Stasia Cavalier, MD 05/29/2023, 1:00 PM   Virtual Visit via Video Note  I connected with Emily Park on 05/29/23 at 11:30 AM EDT by a video enabled telemedicine application and verified that I am speaking with the correct person using two identifiers.  Location: Patient: Home Provider: Home Office   I discussed the limitations of evaluation and management by telemedicine and the availability of in person appointments. The  patient expressed understanding and agreed to proceed.   I discussed the assessment and treatment plan with the patient. The patient was provided an opportunity to ask questions and all were answered. The patient agreed with the plan and demonstrated an understanding of the instructions.   The patient was advised to call back or seek an in-person evaluation if the symptoms worsen or if the condition fails to improve as anticipated.  I provided 20 minutes of non-face-to-face time during this encounter.   Stasia Cavalier, MD

## 2023-05-30 ENCOUNTER — Other Ambulatory Visit (HOSPITAL_COMMUNITY): Payer: Commercial Managed Care - PPO

## 2023-05-30 ENCOUNTER — Other Ambulatory Visit (HOSPITAL_COMMUNITY): Payer: Commercial Managed Care - PPO | Attending: Psychiatry | Admitting: Licensed Clinical Social Worker

## 2023-05-30 DIAGNOSIS — Z79899 Other long term (current) drug therapy: Secondary | ICD-10-CM | POA: Insufficient documentation

## 2023-05-30 DIAGNOSIS — F332 Major depressive disorder, recurrent severe without psychotic features: Secondary | ICD-10-CM | POA: Insufficient documentation

## 2023-05-30 DIAGNOSIS — F411 Generalized anxiety disorder: Secondary | ICD-10-CM | POA: Insufficient documentation

## 2023-05-30 DIAGNOSIS — G4700 Insomnia, unspecified: Secondary | ICD-10-CM | POA: Insufficient documentation

## 2023-05-30 DIAGNOSIS — R42 Dizziness and giddiness: Secondary | ICD-10-CM | POA: Insufficient documentation

## 2023-05-31 ENCOUNTER — Other Ambulatory Visit (HOSPITAL_COMMUNITY): Payer: Commercial Managed Care - PPO

## 2023-05-31 ENCOUNTER — Other Ambulatory Visit (HOSPITAL_COMMUNITY): Payer: Commercial Managed Care - PPO | Admitting: Licensed Clinical Social Worker

## 2023-05-31 DIAGNOSIS — F411 Generalized anxiety disorder: Secondary | ICD-10-CM

## 2023-05-31 DIAGNOSIS — R42 Dizziness and giddiness: Secondary | ICD-10-CM | POA: Diagnosis not present

## 2023-05-31 DIAGNOSIS — Z79899 Other long term (current) drug therapy: Secondary | ICD-10-CM | POA: Diagnosis not present

## 2023-05-31 DIAGNOSIS — R4589 Other symptoms and signs involving emotional state: Secondary | ICD-10-CM

## 2023-05-31 DIAGNOSIS — F332 Major depressive disorder, recurrent severe without psychotic features: Secondary | ICD-10-CM

## 2023-05-31 DIAGNOSIS — G4700 Insomnia, unspecified: Secondary | ICD-10-CM | POA: Diagnosis not present

## 2023-06-01 ENCOUNTER — Encounter (HOSPITAL_COMMUNITY): Payer: Self-pay

## 2023-06-01 ENCOUNTER — Other Ambulatory Visit (HOSPITAL_COMMUNITY): Payer: Commercial Managed Care - PPO | Admitting: Licensed Clinical Social Worker

## 2023-06-01 ENCOUNTER — Other Ambulatory Visit (HOSPITAL_COMMUNITY): Payer: Commercial Managed Care - PPO

## 2023-06-01 DIAGNOSIS — F411 Generalized anxiety disorder: Secondary | ICD-10-CM

## 2023-06-01 DIAGNOSIS — F332 Major depressive disorder, recurrent severe without psychotic features: Secondary | ICD-10-CM | POA: Diagnosis not present

## 2023-06-01 DIAGNOSIS — R4589 Other symptoms and signs involving emotional state: Secondary | ICD-10-CM

## 2023-06-01 MED ORDER — PRAZOSIN HCL 1 MG PO CAPS: 1.0000 mg | ORAL_CAPSULE | Freq: Every day | ORAL | 0 refills | Status: DC

## 2023-06-01 MED ORDER — GABAPENTIN 100 MG PO CAPS: 100.0000 mg | ORAL_CAPSULE | Freq: Every day | ORAL | 0 refills | Status: DC

## 2023-06-01 NOTE — Progress Notes (Unsigned)
Spoke with patient via Teams video call, used 2 identifiers to correctly identify patient. States this is her 2nd time back because she recognized she needed help. Her husband was diagnosed with cancer 1 week after she found out her Father committed suicide. He passed on 22-May-2023 and it was a complete shock to her. She is trying to be strong for her husband but finds herself crying daily. She would like to be back on Prazosin for nightmares since it helped in the past. On scale 1-10 as 10 being worst she rates depression at 6 and anxiety at 6. Denies SI/HI or AV hallucinations. PHQ9=17. Only sleeping 4 hours a night, has a poor appetite but trying to make herself eat healthy. No side effects from medications. No issue or complaint.

## 2023-06-01 NOTE — Progress Notes (Signed)
Virtual Visit via Video Note  I connected with Emily Park on 06/02/23 at  9:00 AM EDT by a video enabled telemedicine application and verified that I am speaking with the correct person using two identifiers.  Location: Patient: Home Provider: Office    I discussed the limitations of evaluation and management by telemedicine and the availability of in person appointments. The patient expressed understanding and agreed to proceed.   I discussed the assessment and treatment plan with the patient. The patient was provided an opportunity to ask questions and all were answered. The patient agreed with the plan and demonstrated an understanding of the instructions.   The patient was advised to call back or seek an in-person evaluation if the symptoms worsen or if the condition fails to improve as anticipated.  I provided 15 minutes of non-face-to-face time during this encounter.   [name], NP   [facility] MD/PA/NP OP Progress Note  06/02/2023 10:41 AM Emily Park  MRN:  [id number]  Chief Complaint:  Emily Park stated " still having nightmares from her childhood and I would like to be restarted on prazosin."  HPI: Emily Park was seen and evaluated via video telehealth assessment.  She is awake, alert and oriented x 3.  Denies suicidal or homicidal ideations.  Denies auditory visual hallucinations.  Reports she has been taking her medications as indicated with a recent medication adjustment to clonidine to help her sleep.  States she was discontinued from presentation due to feeling dizzy.  Stated her symptoms have resolved and would like to be restarted on prazosin due to reported intrusive thoughts/nightmares.  This provider discussed concerns related to taking prazosin and clonidine together.  Education was provider with monitoring blood pressure, this may be causing lightheadedness, dizziness and possibly fainting spells.  Discussed the discontinuing clonidine will initiate prazosin 1  mg.  Patient started gabapentin 100 mg for mood stabilization and insomnia reported issues.   Emily Park reported taken her husband's gabapentin in the past to which this has helped her sleep issues. Emily Park was amendable to discontinuing Clonidine 0.1 mg at bedtime and restarting Prazosin 1 mg and Gabapentin 100 mg nightly. Provided education with sitting on the side of the bed before standing and continue to blood pressure symptoms.   Visit Diagnosis:    ICD-10-CM   1. MDD (major depressive disorder), recurrent severe, without psychosis (HCC)  F33.2     2. Generalized anxiety disorder  F41.1       Past Psychiatric History:   Past Medical History:  Past Medical History:  Diagnosis Date   Abnormal TSH    ADD (attention deficit disorder)    Anemia    Anxiety    Arrhythmia    Decreased libido    Depression    Heart murmur    Hyperlipidemia    Hypokalemia    Migraine    Nephrolithiasis    Pap smear abnormality of cervix/human papillomavirus (HPV) positive    Sleep difficulties    SVT (supraventricular tachycardia) (HCC)     Past Surgical History:  Procedure Laterality Date   ABLATION     Cardiac ablation, endometrial ablation   BREAST LUMPECTOMY     right breast   BREAST LUMPECTOMY Right    CARDIOVERSION     COLONOSCOPY WITH PROPOFOL N/A 05/16/2021   Procedure: COLONOSCOPY WITH PROPOFOL;  Surgeon: Charlott Rakes, MD;  Location: [facility];  Service: Endoscopy;  Laterality: N/A;   COLPOSCOPY     w/ cervical biopsy  DILITATION & CURRETTAGE/HYSTROSCOPY WITH NOVASURE ABLATION N/A 09/11/2014   Procedure: DILATATION & CURETTAGE/HYSTEROSCOPY WITH NOVASURE ABLATION;  Surgeon: Lenoard Aden, MD;  Location: [facility];  Service: Gynecology;  Laterality: N/A;   GIVENS CAPSULE STUDY N/A 05/16/2021   Procedure: GIVENS CAPSULE STUDY;  Surgeon: Charlott Rakes, MD;  [facility];  Service: Endoscopy;  Laterality: N/A;   XI ROBOT ASSISTED RECTOPEXY N/A 07/07/2021   Procedure: XI  ROBOT ASSISTED RECTOPEXY;  Surgeon: Karie Soda, MD;  Location: [facility];  Service: General;  Laterality: N/A;   XI ROBOTIC ASSISTED LOWER ANTERIOR RESECTION N/A 07/07/2021   Procedure: XI ROBOTIC ASSISTED LOW ANTERIOR RECTOSIGMOID RESECTION WITH RIGID PROCTOSCOPY, TAP BLOCK;  Surgeon: Karie Soda, MD;  [facility];  Service: General;  Laterality: N/A;    Family Psychiatric History:   Family History:  Family History  Problem Relation Age of Onset   Hypothyroidism Mother    Depression Mother    Cancer Mother        thyroid   Thyroid disease Mother    Alcohol abuse Father    Hypertension Father    Heart attack Father        Age 22   AAA (abdominal aortic aneurysm) Father    Depression Sister    Hypothyroidism Sister    Hyperthyroidism Sister    Diabetes Maternal Grandfather    Depression Maternal Grandmother    Breast cancer Paternal Grandmother    Depression Cousin     Social History:  Social History   Socioeconomic History   Marital status: Married    Spouse name: Not on file   Number of children: 2   Years of education: college   Highest education level: Associate degree: academic program  Occupational History   Occupation: Charity fundraiser with Engineer, manufacturing systems: [employer]   Tobacco Use   Smoking status: Never    Passive exposure: Current   Smokeless tobacco: Never  Vaping Use   Vaping status: Never Used  Substance and Sexual Activity   Alcohol use: Not Currently    Alcohol/week: 2.0 standard drinks of alcohol    Types: 2 Glasses of wine per week   Drug use: Not Currently   Sexual activity: Yes    Partners: Male    Comment: Married  Other Topics Concern   Not on file  Social History Narrative   Lives at home with two sons.   Right-handed.   60 ounces of tea and soda per day.   Social Determinants of Health   Financial Resource Strain: Low Risk  (10/16/2018)   Received from [facility] visits prior to 10/28/2022., [facility] visits prior to 10/28/2022.   Overall Financial Resource Strain (CARDIA)    Difficulty of Paying Living Expenses: Not hard at all  Food Insecurity: Food Insecurity Present (12/28/2022)   Hunger Vital Sign    Worried About Running Out of Food in the Last Year: Sometimes true    Ran Out of Food in the Last Year: Sometimes true  Transportation Needs: No Transportation Needs (12/28/2022)   PRAPARE - Administrator, Civil Service (Medical): No    Lack of Transportation (Non-Medical): No  Physical Activity: Not on file  Stress: Not on file  Social Connections: Unknown (01/12/2022)   [facility] from [facility]   Social Network    Social Network: Not on file    Allergies:  Allergies  Allergen Reactions   Zithromax [Azithromycin] Rash  Metabolic Disorder Labs: Lab Results  Component Value Date   HGBA1C 4.9 12/20/2022   MPG 93.93 12/20/2022   MPG 93.93 07/06/2021   No results found for: "PROLACTIN" Lab Results  Component Value Date   CHOL 208 (H) 02/24/2014   TRIG 81.0 02/24/2014   HDL 54.90 02/24/2014   CHOLHDL 4 02/24/2014   VLDL 16.2 02/24/2014   LDLCALC 137 (H) 02/24/2014   Lab Results  Component Value Date   TSH 0.18 (L) 03/20/2023   TSH 0.175 (L) 12/21/2022    Therapeutic Level Labs: No results found for: "LITHIUM" No results found for: "VALPROATE" No results found for: "CBMZ"  Current Medications: Current Outpatient Medications  Medication Sig Dispense Refill   ACCRUFER 30 MG CAPS Take 1 capsule (30 mg total) by mouth 2 (two) times daily. 90 capsule 0   Cholecalciferol (VITAMIN D3) 50 MCG (2000 UT) capsule Take 2,000 Units by mouth daily.     clonazePAM (KLONOPIN) 1 MG disintegrating tablet Take 1 tablet (1 mg total) by mouth at bedtime. 30 tablet 2   gabapentin (NEURONTIN) 100 MG capsule Take 1 capsule (100 mg total) by mouth at bedtime. 30 capsule 0   hydrocortisone (ANUSOL-HC) 2.5 % rectal cream Apply 1 Application topically 4  (four) times daily as needed for hemorrhoids. 30 g 0   linaclotide (LINZESS) 145 MCG CAPS capsule Take 1 capsule (145 mcg total) by mouth daily before breakfast. 30 capsule 0   mirtazapine (REMERON) 7.5 MG tablet Take 1 tablet (7.5 mg total) by mouth at bedtime. 30 tablet 2   Multiple Vitamin (MULTIVITAMIN WITH MINERALS) TABS tablet Take 1 tablet by mouth every morning.     pantoprazole (PROTONIX) 40 MG tablet Take 1 tablet (40 mg total) by mouth 2 (two) times daily. 180 tablet 3   polyethylene glycol (MIRALAX / GLYCOLAX) 17 g packet Take 17 g by mouth 2 (two) times daily. (Patient taking differently: Take 17 g by mouth daily as needed for moderate constipation.) 14 each 0   polyvinyl alcohol (LIQUIFILM TEARS) 1.4 % ophthalmic solution Place 1 drop into both eyes as needed for dry eyes. 15 mL 0   potassium chloride SA (KLOR-CON M20) 20 MEQ tablet Take 1 tablet (20 mEq total) by mouth 2 (two) times daily. 180 tablet 1   prazosin (MINIPRESS) 1 MG capsule Take 1 capsule (1 mg total) by mouth at bedtime. 30 capsule 0   rizatriptan (MAXALT) 10 MG tablet PLEASE SEE ATTACHED FOR DETAILED DIRECTIONS     sertraline (ZOLOFT) 100 MG tablet Take 1.5 tablets (150 mg total) by mouth daily. 45 tablet 0   Sodium Fluoride (PREVIDENT 5000 BOOSTER PLUS) 1.1 % PSTE As directed tid 100 mL 3   topiramate (TOPAMAX) 100 MG tablet Take 1 tablet (100 mg total) by mouth daily. 90 tablet 1   loratadine (CLARITIN) 10 MG tablet Take 1 tablet (10 mg total) by mouth daily as needed for allergies. (Patient not taking: Reported on 01/19/2023) 30 tablet 0   No current facility-administered medications for this visit.     Musculoskeletal: Tele-video assessment   Psychiatric Specialty Exam: Review of Systems  Psychiatric/Behavioral:  Positive for decreased concentration and sleep disturbance. The patient is nervous/anxious.   All other systems reviewed and are negative.   There were no vitals taken for this visit.There is no  height or weight on file to calculate BMI.  General Appearance: Casual  Eye Contact:  Good  Speech:  Clear and Coherent  Volume:  Normal  Mood:  Anxious and Depressed  Affect:  Congruent  Thought Process:  Coherent  Orientation:  Full (Time, Place, and Person)  Thought Content: Logical   Suicidal Thoughts:  No  Homicidal Thoughts:  No  Memory:  Immediate;   Good Recent;   Good  Judgement:  Good  Insight:  Good  Psychomotor Activity:  Normal  Concentration:  Concentration: Good  Recall:  Good  Fund of Knowledge: Good  Language: Good  Akathisia:  No  Handed:  Right  AIMS (if indicated): not done  Assets:  Communication Skills Desire for Improvement Social Support  ADL's:  Intact  Cognition: WNL  Sleep:  Good   Screenings: AIMS    Flowsheet Row [facility]  AIMS Total Score 0      AUDIT    Flowsheet Row [facility] ([facility]  Alcohol Use Disorder Identification Test Final Score (AUDIT) 1      GAD-7    Flowsheet Row Office Visit from 03/20/2023 in [facility] [place] Counselor from 01/09/2023 [facility] Office Visit from 11[facility]22/2022 in [facility]  Total GAD-7 Score 7 15 12       PHQ2-9    Flowsheet Row Counselor from 06/01/2023 in [facility] Counselor from 05/03/2023 in [facility] Office Visit from 03/20/2023 in [facility] [place] Counselor from 02/05/2023 in [facility] Counselor from 01/19/2023 in [facility]  PHQ-2 Total Score 2 4 4 1 2   PHQ-9 Total Score 17 14 14 2 11       Flowsheet Row Counselor from 06/01/2023 in [facility] Counselor from 05/03/2023 in [facility]  C-SSRS RISK CATEGORY No Risk Error: Question 2 not populated Moderate Risk        Assessment and Plan:  Patient to continue Partial Hospitalization -Discussed discontinuing clonidine 1 mg will initiate prazosin 1 mg.   -Started gabapentin 100 mg for mood stabilization and insomnia reported issues.    Collaboration of Care: Collaboration of Care: Medication Management AEB restarted Prazosin  Patient/Guardian was advised Release of Information must be obtained prior to any record release in order to collaborate their care with an outside provider. Patient/Guardian was advised if they have not already done so to contact the registration department to sign all necessary forms in order for [name] to release information regarding their care.   Consent: Patient/Guardian gives verbal consent for treatment and assignment of benefits for services provided during this visit. Patient/Guardian expressed understanding and agreed to proceed.    [name], NP 06/02/2023, 10:41 AM

## 2023-06-03 ENCOUNTER — Encounter (HOSPITAL_COMMUNITY): Payer: Self-pay

## 2023-06-03 NOTE — Therapy (Signed)
[facility] PARTIAL HOSPITALIZATION PROGRAM [address] [place], [place], [address] Phone: [phone]   Fax:  [phone]  Occupational Therapy Treatment Virtual Visit via Video Note  I connected with Emily Park on 06/03/23 at  8:00 AM EDT by a video enabled telemedicine application and verified that I am speaking with the correct person using two identifiers.  Location: Patient: home Provider: office   I discussed the limitations of evaluation and management by telemedicine and the availability of in person appointments. The patient expressed understanding and agreed to proceed.    The patient was advised to call back or seek an in-person evaluation if the symptoms worsen or if the condition fails to improve as anticipated.  I provided 55 minutes of non-face-to-face time during this encounter.   Patient Details  Name: Emily Park MRN: [id number] Date of Birth: [birth year] No data recorded  Encounter Date: 05/31/2023   OT End of Session - 06/03/23 2158     Visit Number 3    Number of Visits 20    Date for OT Re-Evaluation 06/16/23    OT Start Time 1200    OT Stop Time 1255    OT Time Calculation (min) 55 min             Past Medical History:  Diagnosis Date   Abnormal TSH    ADD (attention deficit disorder)    Anemia    Anxiety    Arrhythmia    Decreased libido    Depression    Heart murmur    Hyperlipidemia    Hypokalemia    Migraine    Nephrolithiasis    Pap smear abnormality of cervix/human papillomavirus (HPV) positive    Sleep difficulties    SVT (supraventricular tachycardia) (HCC)     Past Surgical History:  Procedure Laterality Date   ABLATION     Cardiac ablation, endometrial ablation   BREAST LUMPECTOMY     right breast   BREAST LUMPECTOMY Right    CARDIOVERSION     COLONOSCOPY WITH PROPOFOL N/A 05/16/2021   Procedure: COLONOSCOPY WITH PROPOFOL;  Surgeon: Charlott Rakes, MD;  [facility];   Service: Endoscopy;  Laterality: N/A;   COLPOSCOPY     w/ cervical biopsy   DILITATION & CURRETTAGE/HYSTROSCOPY WITH NOVASURE ABLATION N/A 09/11/2014   Procedure: DILATATION & CURETTAGE/HYSTEROSCOPY WITH NOVASURE ABLATION;  Surgeon: Lenoard Aden, MD;  [facility]: [facility];  Service: Gynecology;  Laterality: N/A;   GIVENS CAPSULE STUDY N/A 05/16/2021   Procedure: GIVENS CAPSULE STUDY;  Surgeon: Charlott Rakes, MD;  [facility];  Service: Endoscopy;  Laterality: N/A;   XI ROBOT ASSISTED RECTOPEXY N/A 07/07/2021   Procedure: XI ROBOT ASSISTED RECTOPEXY;  Surgeon: Karie Soda, MD;  [facility];  Service: General;  Laterality: N/A;   XI ROBOTIC ASSISTED LOWER ANTERIOR RESECTION N/A 07/07/2021   Procedure: XI ROBOTIC ASSISTED LOW ANTERIOR RECTOSIGMOID RESECTION WITH RIGID PROCTOSCOPY, TAP BLOCK;  Surgeon: Karie Soda, MD;  [facility]: [facility];  Service: General;  Laterality: N/A;    There were no vitals filed for this visit.   Subjective Assessment - 06/03/23 2158     Currently in Pain? No/denies    Pain Score 0-No pain                Group Session:  S: Doing a little better today.   O: The primary objective of this topic is to explore and understand the concept of occupational balance in the  context of daily living. The term "occupational balance" is defined broadly, encompassing all activities that occupy an individual's time and energy, including self-care, leisure, and work-related tasks. The goal is to guide participants towards achieving a harmonious blend of these activities, tailored to their personal values and life circumstances. This balance is aimed at enhancing overall well-being, not by equally distributing time across activities, but by ensuring that daily engagements are fulfilling and not draining. The content delves into identifying various barriers that individuals face in achieving occupational balance, such as overcommitment, misaligned priorities,  external pressures, and lack of effective time management. The impact of these barriers on occupational performance, roles, and lifestyles is examined, highlighting issues like reduced efficiency, strained relationships, and potential health problems. Strategies for cultivating occupational balance are a key focus. These strategies include practical methods like time blocking, prioritizing tasks, establishing self-care rituals, decluttering, connecting with nature, and engaging in reflective practices. These approaches are designed to be adaptable and applicable to a wide range of life scenarios, promoting a proactive and mindful approach to daily living. The overall aim is to equip participants with the knowledge and tools to create a balanced lifestyle that supports their mental, emotional, and physical health, thereby improving their functional performance in daily life.   A:  The patient demonstrated a high level of engagement and active participation throughout the session on occupational balance. The patient frequently contributed to discussions, offering insightful reflections on personal experiences related to the barriers and strategies for achieving occupational balance. There was a clear understanding of the concept and an ability to relate it to their own life. The patient showed enthusiasm in learning and applying the strategies discussed, such as time blocking and self-care rituals, indicating a strong motivation to improve their occupational balance. The patient's proactive approach and responsiveness to the topic suggest a high potential for implementing these strategies effectively in their daily routine.    P: Continue to attend PHP OT group sessions 5x week for 4 weeks to promote daily structure, social engagement, and opportunities to develop and utilize adaptive strategies to maximize functional performance in preparation for safe transition and integration back into school, work, and  the community. Plan to address topic of pt 2 in next OT group session.                   OT Education - 06/03/23 2158     Education Details Occupational Balance              OT Short Term Goals - 05/17/23 2221       OT SHORT TERM GOAL #1   Title Patient will be educated on strategies to improve psychosocial skills needed to participate fully in all daily, work, and leisure activities.    Time 4    Period Weeks    Status On-going    Target Date 06/16/23      OT SHORT TERM GOAL #2   Title Pt will apply psychosocial skills and coping mechanisms to daily activities in order to function independently and reintegrate into community dwelling    Status On-going      OT SHORT TERM GOAL #3   Title By the time of discharge, client will independently set, track, and make progress towards a long-term goal, demonstrating resilience in overcoming obstacles and seeking support when needed.    Status On-going                      Plan -  06/03/23 2159     Psychosocial Skills Coping Strategies;Habits;Interpersonal Interaction;Routines and Behaviors             Patient will benefit from skilled therapeutic intervention in order to improve the following deficits and impairments:       Psychosocial Skills: Coping Strategies, Habits, Interpersonal Interaction, Routines and Behaviors   Visit Diagnosis: Difficulty coping    Problem List Patient Active Problem List   Diagnosis Date Noted   GAD (generalized anxiety disorder) 05/03/2023   Bereavement, uncomplicated 05/03/2023   MDD (major depressive disorder), recurrent episode, moderate (HCC) 05/03/2023   Insomnia 03/20/2023   MDD (major depressive disorder), recurrent severe, without psychosis (HCC) 12/28/2022   CKD stage 3a, GFR 45-59 ml/min (HCC) 12/25/2022   Chronic anemia 12/25/2022   Major depressive disorder, recurrent episode, severe (HCC) 12/25/2022   Generalized anxiety disorder 12/24/2022    Hypoglycemia 12/21/2022   Thyroid 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   Anxiety 02/24/2021   Heart murmur 02/24/2021   Menorrhagia 02/24/2021   Thyroid nodule 10/26/2020   Acute renal failure superimposed on stage 2 chronic kidney disease (HCC)    Hypotension    Hypovolemic shock (HCC) 08/29/2020   Rectal prolapse 04/05/2020   Migraine 02/27/2019   Mild episode of recurrent major depressive disorder (HCC) 10/25/2018   Abnormal thyroid stimulating hormone (TSH) level 10/24/2018   Syncope and collapse 10/10/2018   Lung nodules 11/14/2015   Paroxysmal SVT (supraventricular tachycardia) (HCC) 11/13/2015   Attention deficit hyperactivity disorder (ADHD) 11/13/2015    Ted Mcalpine, OT 06/03/2023, 10:00 PM  Kerrin Champagne, OT   [facility] HOSPITALIZATION PROGRAM [address] [place], [place], [address] Phone: [phone]   Fax:  [phone]  Name: Emily Park MRN: [id number] Date of Birth: Mar 01, 1971

## 2023-06-03 NOTE — Therapy (Signed)
Psychosocial Skills Coping Strategies;Habits;Interpersonal Interaction;Routines and Behaviors             Patient will benefit from skilled therapeutic intervention in order to improve the following deficits and impairments:       Psychosocial Skills: Coping Strategies, Habits, Interpersonal Interaction, Routines and Behaviors   Visit Diagnosis: Difficulty coping    Problem List Patient Active Problem List   Diagnosis Date Noted   GAD (generalized anxiety disorder) 05/03/2023   Bereavement, uncomplicated 05/03/2023   MDD (major depressive disorder), recurrent episode, moderate (HCC) 05/03/2023   Insomnia 03/20/2023   MDD (major depressive disorder), recurrent severe, without psychosis (HCC) 12/28/2022   CKD stage 3a, GFR 45-59 ml/min (HCC) 12/25/2022   Chronic anemia 12/25/2022   Major depressive disorder, recurrent episode, severe (HCC) 12/25/2022   Generalized anxiety disorder 12/24/2022    Hypoglycemia 12/21/2022   Thyroid 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   Anxiety 02/24/2021   Heart murmur 02/24/2021   Menorrhagia 02/24/2021   Thyroid nodule 10/26/2020   Acute renal failure superimposed on stage 2 chronic kidney disease (HCC)    Hypotension    Hypovolemic shock (HCC) 08/29/2020   Rectal prolapse 04/05/2020   Migraine 02/27/2019   Mild episode of recurrent major depressive disorder (HCC) 10/25/2018   Abnormal thyroid stimulating hormone (TSH) level 10/24/2018   Syncope and collapse 10/10/2018   Lung nodules 11/14/2015   Paroxysmal SVT (supraventricular tachycardia) (HCC) 11/13/2015   Attention deficit hyperactivity disorder (ADHD) 11/13/2015    Ted Mcalpine, OT 06/03/2023, 10:06 PM  Kerrin Champagne, OT   [facility] HOSPITALIZATION PROGRAM [address] [place], [place], [address] Phone: [phone]   Fax:  [phone]  Name: Emily Park MRN: [id number] Date of Birth: 08-May-1971  Psychosocial Skills Coping Strategies;Habits;Interpersonal Interaction;Routines and Behaviors             Patient will benefit from skilled therapeutic intervention in order to improve the following deficits and impairments:       Psychosocial Skills: Coping Strategies, Habits, Interpersonal Interaction, Routines and Behaviors   Visit Diagnosis: Difficulty coping    Problem List Patient Active Problem List   Diagnosis Date Noted   GAD (generalized anxiety disorder) 05/03/2023   Bereavement, uncomplicated 05/03/2023   MDD (major depressive disorder), recurrent episode, moderate (HCC) 05/03/2023   Insomnia 03/20/2023   MDD (major depressive disorder), recurrent severe, without psychosis (HCC) 12/28/2022   CKD stage 3a, GFR 45-59 ml/min (HCC) 12/25/2022   Chronic anemia 12/25/2022   Major depressive disorder, recurrent episode, severe (HCC) 12/25/2022   Generalized anxiety disorder 12/24/2022    Hypoglycemia 12/21/2022   Thyroid 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   Anxiety 02/24/2021   Heart murmur 02/24/2021   Menorrhagia 02/24/2021   Thyroid nodule 10/26/2020   Acute renal failure superimposed on stage 2 chronic kidney disease (HCC)    Hypotension    Hypovolemic shock (HCC) 08/29/2020   Rectal prolapse 04/05/2020   Migraine 02/27/2019   Mild episode of recurrent major depressive disorder (HCC) 10/25/2018   Abnormal thyroid stimulating hormone (TSH) level 10/24/2018   Syncope and collapse 10/10/2018   Lung nodules 11/14/2015   Paroxysmal SVT (supraventricular tachycardia) (HCC) 11/13/2015   Attention deficit hyperactivity disorder (ADHD) 11/13/2015    Ted Mcalpine, OT 06/03/2023, 10:06 PM  Kerrin Champagne, OT   [facility] HOSPITALIZATION PROGRAM [address] [place], [place], [address] Phone: [phone]   Fax:  [phone]  Name: Emily Park MRN: [id number] Date of Birth: 08-May-1971  [facility] PARTIAL HOSPITALIZATION PROGRAM [address] [place], [place], [address] Phone: [phone]   Fax:  [phone]  Occupational Therapy Treatment Virtual Visit via Video Note  I connected with Kanda Deluna Bunkley on 06/03/23 at  8:00 AM EDT by a video enabled telemedicine application and verified that I am speaking with the correct person using two identifiers.  Location: Patient: home Provider: office   I discussed the limitations of evaluation and management by telemedicine and the availability of in person appointments. The patient expressed understanding and agreed to proceed.    The patient was advised to call back or seek an in-person evaluation if the symptoms worsen or if the condition fails to improve as anticipated.  I provided 55 minutes of non-face-to-face time during this encounter.   Patient Details  Name: Emily Park MRN: [id number] Date of Birth: [birth year] No data recorded  Encounter Date: 06/01/2023   OT End of Session - 06/03/23 2205     Visit Number 4    Number of Visits 20    Date for OT Re-Evaluation 06/16/23    OT Start Time 1200    OT Stop Time 1255    OT Time Calculation (min) 55 min             Past Medical History:  Diagnosis Date   Abnormal TSH    ADD (attention deficit disorder)    Anemia    Anxiety    Arrhythmia    Decreased libido    Depression    Heart murmur    Hyperlipidemia    Hypokalemia    Migraine    Nephrolithiasis    Pap smear abnormality of cervix/human papillomavirus (HPV) positive    Sleep difficulties    SVT (supraventricular tachycardia) (HCC)     Past Surgical History:  Procedure Laterality Date   ABLATION     Cardiac ablation, endometrial ablation   BREAST LUMPECTOMY     right breast   BREAST LUMPECTOMY Right    CARDIOVERSION     COLONOSCOPY WITH PROPOFOL N/A 05/16/2021   Procedure: COLONOSCOPY WITH PROPOFOL;  Surgeon: Charlott Rakes, MD;  Location: [facility];   Service: Endoscopy;  Laterality: N/A;   COLPOSCOPY     w/ cervical biopsy   DILITATION & CURRETTAGE/HYSTROSCOPY WITH NOVASURE ABLATION N/A 09/11/2014   Procedure: DILATATION & CURETTAGE/HYSTEROSCOPY WITH NOVASURE ABLATION;  Surgeon: Lenoard Aden, MD;  Location: [facility];  Service: Gynecology;  Laterality: N/A;   GIVENS CAPSULE STUDY N/A 05/16/2021   Procedure: GIVENS CAPSULE STUDY;  Surgeon: Charlott Rakes, MD;  Location: [facility];  Service: Endoscopy;  Laterality: N/A;   XI ROBOT ASSISTED RECTOPEXY N/A 07/07/2021   Procedure: XI ROBOT ASSISTED RECTOPEXY;  Surgeon: Karie Soda, MD;  Location: [facility];  Service: General;  Laterality: N/A;   XI ROBOTIC ASSISTED LOWER ANTERIOR RESECTION N/A 07/07/2021   Procedure: XI ROBOTIC ASSISTED LOW ANTERIOR RECTOSIGMOID RESECTION WITH RIGID PROCTOSCOPY, TAP BLOCK;  Surgeon: Karie Soda, MD;  Location: [facility];  Service: General;  Laterality: N/A;    There were no vitals filed for this visit.   Subjective Assessment - 06/03/23 2205     Currently in Pain? No/denies    Pain Score 0-No pain                 Group Session:  S: Better today.   O: The primary objective of this topic is to explore and understand the concept of occupational balance in the context of

## 2023-06-04 ENCOUNTER — Other Ambulatory Visit (HOSPITAL_COMMUNITY): Payer: Commercial Managed Care - PPO | Attending: Psychiatry

## 2023-06-04 ENCOUNTER — Other Ambulatory Visit (HOSPITAL_COMMUNITY): Payer: Commercial Managed Care - PPO | Attending: Psychiatry | Admitting: Family

## 2023-06-04 DIAGNOSIS — Z79899 Other long term (current) drug therapy: Secondary | ICD-10-CM | POA: Insufficient documentation

## 2023-06-04 DIAGNOSIS — G4700 Insomnia, unspecified: Secondary | ICD-10-CM | POA: Insufficient documentation

## 2023-06-04 DIAGNOSIS — F515 Nightmare disorder: Secondary | ICD-10-CM | POA: Insufficient documentation

## 2023-06-04 DIAGNOSIS — Z634 Disappearance and death of family member: Secondary | ICD-10-CM | POA: Diagnosis not present

## 2023-06-04 DIAGNOSIS — F332 Major depressive disorder, recurrent severe without psychotic features: Secondary | ICD-10-CM | POA: Insufficient documentation

## 2023-06-04 DIAGNOSIS — R4589 Other symptoms and signs involving emotional state: Secondary | ICD-10-CM

## 2023-06-04 DIAGNOSIS — F411 Generalized anxiety disorder: Secondary | ICD-10-CM | POA: Insufficient documentation

## 2023-06-04 MED ORDER — GABAPENTIN 300 MG PO CAPS: 300.0000 mg | ORAL_CAPSULE | Freq: Every day | ORAL | 0 refills | Status: DC

## 2023-06-04 NOTE — Progress Notes (Addendum)
Virtual Visit via Video Note  I connected with Emily Park on 06/04/23 at  9:00 AM EDT by a video enabled telemedicine application and verified that I am speaking with the correct person using two identifiers.  Location: Patient: Home Provider: Home Office    I discussed the limitations of evaluation and management by telemedicine and the availability of in person appointments. The patient expressed understanding and agreed to proceed.   I discussed the assessment and treatment plan with the patient. The patient was provided an opportunity to ask questions and all were answered. The patient agreed with the plan and demonstrated an understanding of the instructions.   The patient was advised to call back or seek an in-person evaluation if the symptoms worsen or if the condition fails to improve as anticipated.  I provided 15 minutes of non-face-to-face time during this encounter.   Lamar Sprinkles, MD   [facility] MD/PA/NP OP Progress Note  06/04/2023 3:00 PM Emily Park  MRN:  [id number]  Chief Complaint:  Emily Park stated " still having nightmares from her childhood and I would like to be restarted on prazosin."  HPI: Emily Park was seen and evaluated via video telehealth assessment.  She is awake, alert and oriented x 3.  Denies suicidal or homicidal ideations.  Denies auditory visual hallucinations.  Reports she has been taking her medications as indicated and noted benefit in nightmares with the addition of Prazosin. She still has some difficulties sleeping and requests an increase in her gabapentin, as gabapentin has previously helped her get to sleep.  She denies dizziness with 1 mg Prazosin and discontinuing Clonidine.   She denies SI, HI, and AVH as well as further concerns today.    Visit Diagnosis:    ICD-10-CM   1. MDD (major depressive disorder), recurrent severe, without psychosis (HCC)  F33.2     2. Bereavement, uncomplicated  Z63.4     3. Long-term current use of  benzodiazepine  Z79.899     4. Generalized anxiety disorder  F41.1     5. Insomnia, unspecified type  G47.00        Past Psychiatric History:   Past Medical History:  Past Medical History:  Diagnosis Date   Abnormal TSH    ADD (attention deficit disorder)    Anemia    Anxiety    Arrhythmia    Decreased libido    Depression    Heart murmur    Hyperlipidemia    Hypokalemia    Migraine    Nephrolithiasis    Pap smear abnormality of cervix/human papillomavirus (HPV) positive    Sleep difficulties    SVT (supraventricular tachycardia) (HCC)     Past Surgical History:  Procedure Laterality Date   ABLATION     Cardiac ablation, endometrial ablation   BREAST LUMPECTOMY     right breast   BREAST LUMPECTOMY Right    CARDIOVERSION     COLONOSCOPY WITH PROPOFOL N/A 05/16/2021   Procedure: COLONOSCOPY WITH PROPOFOL;  Surgeon: Charlott Rakes, MD;  [facility];  Service: Endoscopy;  Laterality: N/A;   COLPOSCOPY     w/ cervical biopsy   DILITATION & CURRETTAGE/HYSTROSCOPY WITH NOVASURE ABLATION N/A 09/11/2014   Procedure: DILATATION & CURETTAGE/HYSTEROSCOPY WITH NOVASURE ABLATION;  Surgeon: Lenoard Aden, MD;  Location: [facility];  Service: Gynecology;  Laterality: N/A;   GIVENS CAPSULE STUDY N/A 05/16/2021   Procedure: GIVENS CAPSULE STUDY;  Surgeon: Charlott Rakes, MD;  [facility]: [facility];  Service: Endoscopy;  Laterality: N/A;  XI ROBOT ASSISTED RECTOPEXY N/A 07/07/2021   Procedure: XI ROBOT ASSISTED RECTOPEXY;  Surgeon: Karie Soda, MD;  Location: [facility];  Service: General;  Laterality: N/A;   XI ROBOTIC ASSISTED LOWER ANTERIOR RESECTION N/A 07/07/2021   Procedure: XI ROBOTIC ASSISTED LOW ANTERIOR RECTOSIGMOID RESECTION WITH RIGID PROCTOSCOPY, TAP BLOCK;  Surgeon: Karie Soda, MD;  Location: [facility];  Service: General;  Laterality: N/A;    Family Psychiatric History:   Family History:  Family History  Problem Relation Age of Onset   Hypothyroidism  Mother    Depression Mother    Cancer Mother        thyroid   Thyroid disease Mother    Alcohol abuse Father    Hypertension Father    Heart attack Father        Age 41   AAA (abdominal aortic aneurysm) Father    Depression Sister    Hypothyroidism Sister    Hyperthyroidism Sister    Diabetes Maternal Grandfather    Depression Maternal Grandmother    Breast cancer Paternal Grandmother    Depression Cousin     Social History:  Social History   Socioeconomic History   Marital status: Married    Spouse name: Not on file   Number of children: 2   Years of education: college   Highest education level: Associate degree: academic program  Occupational History   Occupation: Charity fundraiser with Engineer, manufacturing systems: [employer]   Tobacco Use   Smoking status: Never    Passive exposure: Current   Smokeless tobacco: Never  Vaping Use   Vaping status: Never Used  Substance and Sexual Activity   Alcohol use: Not Currently    Alcohol/week: 2.0 standard drinks of alcohol    Types: 2 Glasses of wine per week   Drug use: Not Currently   Sexual activity: Yes    Partners: Male    Comment: Married  Other Topics Concern   Not on file  Social History Narrative   Lives at home with two sons.   Right-handed.   60 ounces of tea and soda per day.   Social Determinants of Health   Financial Resource Strain: Low Risk  (10/16/2018)   Received from [facility] visits prior to 10/28/2022., [facility] visits prior to 10/28/2022.   Overall Financial Resource Strain (CARDIA)    Difficulty of Paying Living Expenses: Not hard at all  Food Insecurity: Food Insecurity Present (12/28/2022)   Hunger Vital Sign    Worried About Running Out of Food in the Last Year: Sometimes true    Ran Out of Food in the Last Year: Sometimes true  Transportation Needs: No Transportation Needs (12/28/2022)   PRAPARE - Administrator, Civil Service (Medical): No    Lack of  Transportation (Non-Medical): No  Physical Activity: Not on file  Stress: Not on file  Social Connections: Unknown (01/12/2022)   [facility] from [facility]   Social Network    Social Network: Not on file    Allergies:  Allergies  Allergen Reactions   Zithromax [Azithromycin] Rash    Metabolic Disorder Labs: Lab Results  Component Value Date   HGBA1C 4.9 12/20/2022   MPG 93.93 12/20/2022   MPG 93.93 07/06/2021   No results found for: "PROLACTIN" Lab Results  Component Value Date   CHOL 208 (H) 02/24/2014   TRIG 81.0 02/24/2014   HDL 54.90 02/24/2014   CHOLHDL 4 02/24/2014  VLDL 16.2 02/24/2014   LDLCALC 137 (H) 02/24/2014   Lab Results  Component Value Date   TSH 0.18 (L) 03/20/2023   TSH 0.175 (L) 12/21/2022    Therapeutic Level Labs: No results found for: "LITHIUM" No results found for: "VALPROATE" No results found for: "CBMZ"  Current Medications: Current Outpatient Medications  Medication Sig Dispense Refill   ACCRUFER 30 MG CAPS Take 1 capsule (30 mg total) by mouth 2 (two) times daily. 90 capsule 0   linaclotide (LINZESS) 145 MCG CAPS capsule Take 1 capsule (145 mcg total) by mouth daily before breakfast. 30 capsule 0   mirtazapine (REMERON) 7.5 MG tablet Take 1 tablet (7.5 mg total) by mouth at bedtime. 30 tablet 2   pantoprazole (PROTONIX) 40 MG tablet Take 1 tablet (40 mg total) by mouth 2 (two) times daily. 180 tablet 3   potassium chloride SA (KLOR-CON M20) 20 MEQ tablet Take 1 tablet (20 mEq total) by mouth 2 (two) times daily. 180 tablet 1   prazosin (MINIPRESS) 1 MG capsule Take 1 capsule (1 mg total) by mouth at bedtime. 30 capsule 0   sertraline (ZOLOFT) 100 MG tablet Take 1.5 tablets (150 mg total) by mouth daily. 45 tablet 0   topiramate (TOPAMAX) 100 MG tablet Take 1 tablet (100 mg total) by mouth daily. 90 tablet 1   Cholecalciferol (VITAMIN D3) 50 MCG (2000 UT) capsule Take 2,000 Units by mouth daily.     clonazePAM  (KLONOPIN) 1 MG disintegrating tablet Take 1 tablet (1 mg total) by mouth at bedtime. 30 tablet 2   [START ON 06/09/2023] gabapentin (NEURONTIN) 300 MG capsule Take 1 capsule (300 mg total) by mouth at bedtime. 30 capsule 0   hydrocortisone (ANUSOL-HC) 2.5 % rectal cream Apply 1 Application topically 4 (four) times daily as needed for hemorrhoids. 30 g 0   loratadine (CLARITIN) 10 MG tablet Take 1 tablet (10 mg total) by mouth daily as needed for allergies. (Patient not taking: Reported on 01/19/2023) 30 tablet 0   Multiple Vitamin (MULTIVITAMIN WITH MINERALS) TABS tablet Take 1 tablet by mouth every morning.     polyethylene glycol (MIRALAX / GLYCOLAX) 17 g packet Take 17 g by mouth 2 (two) times daily. (Patient taking differently: Take 17 g by mouth daily as needed for moderate constipation.) 14 each 0   polyvinyl alcohol (LIQUIFILM TEARS) 1.4 % ophthalmic solution Place 1 drop into both eyes as needed for dry eyes. 15 mL 0   rizatriptan (MAXALT) 10 MG tablet PLEASE SEE ATTACHED FOR DETAILED DIRECTIONS     Sodium Fluoride (PREVIDENT 5000 BOOSTER PLUS) 1.1 % PSTE As directed tid 100 mL 3   No current facility-administered medications for this visit.     Musculoskeletal: Tele-video assessment   Psychiatric Specialty Exam: Review of Systems  Psychiatric/Behavioral:  Positive for decreased concentration and sleep disturbance. The patient is nervous/anxious.   All other systems reviewed and are negative.   There were no vitals taken for this visit.There is no height or weight on file to calculate BMI.  General Appearance: Casual  Eye Contact:  Good  Speech:  Clear and Coherent  Volume:  Normal  Mood:  Euthymic  Affect:  Congruent  Thought Process:  Coherent  Orientation:  Full (Time, Place, and Person)  Thought Content: Logical   Suicidal Thoughts:  No  Homicidal Thoughts:  No  Memory:  Immediate;   Good Recent;   Good  Judgement:  Good  Insight:  Good  Psychomotor Activity:  Normal  Concentration:  Concentration: Good  Recall:  Good  Fund of Knowledge: Good  Language: Good  Akathisia:  No  Handed:  Right  AIMS (if indicated): not done  Assets:  Communication Skills Desire for Improvement Social Support  ADL's:  Intact  Cognition: WNL  Sleep:  Fair   Screenings: AIMS    Flowsheet Row [facility]  AIMS Total Score 0      AUDIT    Flowsheet Row Ad[facility] ([facility]  Alcohol Use Disorder Identification Test Final Score (AUDIT) 1      GAD-7    Flowsheet Row Office Visit from 03/20/2023 in [facility] [place] Counselor from 01/09/2023 in [facility] Office Visit from 11/22/2022 in [facility]  Total GAD-7 Score 7 15 12       PHQ2-9    Flowsheet Row Counselor from 06/01/2023 in [facility] Counselor from 05/03/2023 in [facility] Office Visit from 03/20/2023 in [facility] [place] Counselor from 02/05/2023 in [facility]ITAL[facility] PRO[facility] Counselor from 01/19/2023 in [facility]  PHQ-2 Total Score 2 4 4 1 2   PHQ-9 Total Score 17 14 14 2 11       Flowsheet Row Counselor from 06/01/2023 in [facility] Counselor from 05/03/2023 in [facility] Office [facility]  C-SSRS RISK CATEGORY No Risk Error: Question 2 not populated Moderate Risk        Assessment and Plan: Emily Park is a 52 year old female admitted to the partial hospitalization program due to difficulty coping in grief process and depression.  Today, Emily Park reports stable mood  and improving sleep with the initiation of prazosin for nightmares.  Although her nightmares have not completely dissipated, she has noticed significant improvement.  As well, she does not feel dizzy on current dosage.  She does request to increase her gabapentin dosage to 300 mg nightly, as this is a dosage that worked well for her in the past.  As well, she has been taking her husband's gabapentin to get her bedtime dosage to 300 mg.  Although this is not recommended, and typically do not prescribe based on patient's misuse of medications, agree that since medication was previously started, we will increase dosage.  No other medication changes to be made at this time. Patient to continue Partial Hospitalization.  Note: I did not have access to patient's chart at the time of assessment, as it was not located within the [facility] context.  Reviewed current medications with patient, but chart review upon gaining access revealed a couple of medications not mentioned at the time of assessment.  Medications had been reviewed by previous providers, but I will need to independently verify all of patient's medications at subsequent visit.  #MDD #GAD #Insomnia -Continue prazosin 1 mg nightly for nightmares -Increase to gabapentin 300 mg for mood stabilization and insomnia reported issues.  -Continue Zoloft 150 mg daily for depression and anxiety -Continue mirtazapine 7.5 mg nightly for depression, insomnia, and appetite stimulation -Continue Klonopin 1 mg nightly for anxiety and insomnia   Collaboration of Care: Collaboration of Care: Dr. Mercy Riding  Patient/Guardian was advised Release of Information must be obtained prior to any record release in order to collaborate their care with an  outside provider. Patient/Guardian was advised if they have not already done so to contact the registration department to sign all necessary forms in order for [name] to release information regarding their care.   Consent:  Patient/Guardian gives verbal consent for treatment and assignment of benefits for services provided during this visit. Patient/Guardian expressed understanding and agreed to proceed.    Lamar Sprinkles, MD 06/04/2023, 3:00 PM

## 2023-06-04 NOTE — Addendum Note (Signed)
Addended by: Stormy Card on: 06/04/2023 03:12 PM   Modules accepted: Level of Service

## 2023-06-04 NOTE — Addendum Note (Signed)
Addended by: Everlena Cooper on: 06/04/2023 03:17 PM   Modules accepted: Level of Service

## 2023-06-05 ENCOUNTER — Other Ambulatory Visit (HOSPITAL_COMMUNITY): Payer: Commercial Managed Care - PPO

## 2023-06-05 DIAGNOSIS — F329 Major depressive disorder, single episode, unspecified: Secondary | ICD-10-CM | POA: Insufficient documentation

## 2023-06-06 ENCOUNTER — Other Ambulatory Visit (HOSPITAL_COMMUNITY): Payer: Commercial Managed Care - PPO

## 2023-06-06 ENCOUNTER — Encounter (HOSPITAL_COMMUNITY): Payer: Self-pay

## 2023-06-06 NOTE — Therapy (Signed)
1610     Psychosocial Skills Coping Strategies;Habits;Interpersonal Interaction;Routines and Behaviors             Patient will benefit from skilled therapeutic intervention in order to improve the following deficits and impairments:       Psychosocial Skills: Coping Strategies, Habits, Interpersonal Interaction, Routines and Behaviors   Visit Diagnosis: Difficulty coping    Problem List Patient Active Problem List   Diagnosis Date Noted   GAD (generalized anxiety disorder) 05/03/2023   Bereavement, uncomplicated 05/03/2023   MDD (major depressive disorder), recurrent episode, moderate (HCC) 05/03/2023   Insomnia 03/20/2023   MDD (major depressive disorder), recurrent severe, without psychosis (HCC) 12/28/2022   CKD stage 3a, GFR 45-59 ml/min (HCC) 12/25/2022   Chronic anemia 12/25/2022   Major depressive disorder, recurrent episode, severe (HCC) 12/25/2022   Generalized anxiety disorder 12/24/2022    Hypoglycemia 12/21/2022   Thyroid 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   Anxiety 02/24/2021   Heart murmur 02/24/2021   Menorrhagia 02/24/2021   Thyroid nodule 10/26/2020   Acute renal failure superimposed on stage 2 chronic kidney disease (HCC)    Hypotension    Hypovolemic shock (HCC) 08/29/2020   Rectal prolapse 04/05/2020   Migraine 02/27/2019   Mild episode of recurrent major depressive disorder (HCC) 10/25/2018   Abnormal thyroid stimulating hormone (TSH) level 10/24/2018   Syncope and collapse 10/10/2018   Lung nodules 11/14/2015   Paroxysmal SVT (supraventricular tachycardia) (HCC) 11/13/2015   Attention deficit hyperactivity disorder (ADHD) 11/13/2015    Ted Mcalpine, OT 06/06/2023, 9:09 AM  Kerrin Champagne, OT   [facility] HOSPITALIZATION PROGRAM [address] [place], [place], [address] Phone: [phone]   Fax:  [phone]  Name: Emily Park MRN: [id number] Date of Birth: 02-17-1971  [facility] PARTIAL HOSPITALIZATION PROGRAM [address] [place], [place], [address] Phone: [phone]   Fax:  [phone]  Occupational Therapy Treatment Virtual Visit via Video Note  I connected with Emily Park on 06/06/23 at  8:00 AM EDT by a video enabled telemedicine application and verified that I am speaking with the correct person using two identifiers.  Location: Patient: home Provider: office   I discussed the limitations of evaluation and management by telemedicine and the availability of in person appointments. The patient expressed understanding and agreed to proceed.    The patient was advised to call back or seek an in-person evaluation if the symptoms worsen or if the condition fails to improve as anticipated.  I provided 55 minutes of non-face-to-face time during this encounter.  Patient Details  Name: Emily Park MRN: [id number] Date of Birth: 10-Apr-1971 No data recorded  Encounter Date: 06/04/2023   OT End of Session - 06/06/23 0909     Visit Number 5    Number of Visits 20    Date for OT Re-Evaluation 06/16/23    OT Start Time 1200    OT Stop Time 1255    OT Time Calculation (min) 55 min             Past Medical History:  Diagnosis Date   Abnormal TSH    ADD (attention deficit disorder)    Anemia    Anxiety    Arrhythmia    Decreased libido    Depression    Heart murmur    Hyperlipidemia    Hypokalemia    Migraine    Nephrolithiasis    Pap smear abnormality of cervix/human papillomavirus (HPV) positive    Sleep difficulties    SVT (supraventricular tachycardia) (HCC)     Past Surgical History:  Procedure Laterality Date   ABLATION     Cardiac ablation, endometrial ablation   BREAST LUMPECTOMY     right breast   BREAST LUMPECTOMY Right    CARDIOVERSION     COLONOSCOPY WITH PROPOFOL N/A 05/16/2021   Procedure: COLONOSCOPY WITH PROPOFOL;  Surgeon: Charlott Rakes, MD;  Location: [facility];   Service: Endoscopy;  Laterality: N/A;   COLPOSCOPY     w/ cervical biopsy   DILITATION & CURRETTAGE/HYSTROSCOPY WITH NOVASURE ABLATION N/A 09/11/2014   Procedure: DILATATION & CURETTAGE/HYSTEROSCOPY WITH NOVASURE ABLATION;  Surgeon: Lenoard Aden, MD;  Location: [facility];  Service: Gynecology;  Laterality: N/A;   GIVENS CAPSULE STUDY N/A 05/16/2021   Procedure: GIVENS CAPSULE STUDY;  Surgeon: Charlott Rakes, MD;  Location: [facility];  Service: Endoscopy;  Laterality: N/A;   XI ROBOT ASSISTED RECTOPEXY N/A 07/07/2021   Procedure: XI ROBOT ASSISTED RECTOPEXY;  Surgeon: Karie Soda, MD;  Location: [facility];  Service: General;  Laterality: N/A;   XI ROBOTIC ASSISTED LOWER ANTERIOR RESECTION N/A 07/07/2021   Procedure: XI ROBOTIC ASSISTED LOW ANTERIOR RECTOSIGMOID RESECTION WITH RIGID PROCTOSCOPY, TAP BLOCK;  Surgeon: Karie Soda, MD;  Location: [facility];  Service: General;  Laterality: N/A;    There were no vitals filed for this visit.   Subjective Assessment - 06/06/23 0908     Currently in Pain? No/denies    Pain Score 0-No pain                 Group Session:  S: Doing a little better today.   O: The primary objective of this topic is to explore and understand the concept of occupational balance in the  [facility] PARTIAL HOSPITALIZATION PROGRAM [address] [place], [place], [address] Phone: [phone]   Fax:  [phone]  Occupational Therapy Treatment Virtual Visit via Video Note  I connected with Emily Park on 06/06/23 at  8:00 AM EDT by a video enabled telemedicine application and verified that I am speaking with the correct person using two identifiers.  Location: Patient: home Provider: office   I discussed the limitations of evaluation and management by telemedicine and the availability of in person appointments. The patient expressed understanding and agreed to proceed.    The patient was advised to call back or seek an in-person evaluation if the symptoms worsen or if the condition fails to improve as anticipated.  I provided 55 minutes of non-face-to-face time during this encounter.  Patient Details  Name: Emily Park MRN: [id number] Date of Birth: 10-Apr-1971 No data recorded  Encounter Date: 06/04/2023   OT End of Session - 06/06/23 0909     Visit Number 5    Number of Visits 20    Date for OT Re-Evaluation 06/16/23    OT Start Time 1200    OT Stop Time 1255    OT Time Calculation (min) 55 min             Past Medical History:  Diagnosis Date   Abnormal TSH    ADD (attention deficit disorder)    Anemia    Anxiety    Arrhythmia    Decreased libido    Depression    Heart murmur    Hyperlipidemia    Hypokalemia    Migraine    Nephrolithiasis    Pap smear abnormality of cervix/human papillomavirus (HPV) positive    Sleep difficulties    SVT (supraventricular tachycardia) (HCC)     Past Surgical History:  Procedure Laterality Date   ABLATION     Cardiac ablation, endometrial ablation   BREAST LUMPECTOMY     right breast   BREAST LUMPECTOMY Right    CARDIOVERSION     COLONOSCOPY WITH PROPOFOL N/A 05/16/2021   Procedure: COLONOSCOPY WITH PROPOFOL;  Surgeon: Charlott Rakes, MD;  [facility];   Service: Endoscopy;  Laterality: N/A;   COLPOSCOPY     w/ cervical biopsy   DILITATION & CURRETTAGE/HYSTROSCOPY WITH NOVASURE ABLATION N/A 09/11/2014   Procedure: DILATATION & CURETTAGE/HYSTEROSCOPY WITH NOVASURE ABLATION;  Surgeon: Lenoard Aden, MD;  [facility]: [facility];  Service: Gynecology;  Laterality: N/A;   GIVENS CAPSULE STUDY N/A 05/16/2021   Procedure: GIVENS CAPSULE STUDY;  Surgeon: Charlott Rakes, MD;  [facility];  Service: Endoscopy;  Laterality: N/A;   XI ROBOT ASSISTED RECTOPEXY N/A 07/07/2021   Procedure: XI ROBOT ASSISTED RECTOPEXY;  Surgeon: Karie Soda, MD;  [facility];  Service: General;  Laterality: N/A;   XI ROBOTIC ASSISTED LOWER ANTERIOR RESECTION N/A 07/07/2021   Procedure: XI ROBOTIC ASSISTED LOW ANTERIOR RECTOSIGMOID RESECTION WITH RIGID PROCTOSCOPY, TAP BLOCK;  Surgeon: Karie Soda, MD;  Location: [facility];  Service: General;  Laterality: N/A;    There were no vitals filed for this visit.   Subjective Assessment - 06/06/23 0908     Currently in Pain? No/denies    Pain Score 0-No pain                 Group Session:  S: Doing a little better today.   O: The primary objective of this topic is to explore and understand the concept of occupational balance in the

## 2023-06-07 ENCOUNTER — Other Ambulatory Visit (HOSPITAL_COMMUNITY): Payer: Commercial Managed Care - PPO | Attending: Psychiatry

## 2023-06-07 ENCOUNTER — Other Ambulatory Visit (HOSPITAL_COMMUNITY): Payer: Commercial Managed Care - PPO | Admitting: Licensed Clinical Social Worker

## 2023-06-07 DIAGNOSIS — G4700 Insomnia, unspecified: Secondary | ICD-10-CM | POA: Diagnosis not present

## 2023-06-07 DIAGNOSIS — F411 Generalized anxiety disorder: Secondary | ICD-10-CM

## 2023-06-07 DIAGNOSIS — F515 Nightmare disorder: Secondary | ICD-10-CM | POA: Diagnosis not present

## 2023-06-07 DIAGNOSIS — Z634 Disappearance and death of family member: Secondary | ICD-10-CM

## 2023-06-07 DIAGNOSIS — Z79899 Other long term (current) drug therapy: Secondary | ICD-10-CM | POA: Diagnosis not present

## 2023-06-07 DIAGNOSIS — F332 Major depressive disorder, recurrent severe without psychotic features: Secondary | ICD-10-CM | POA: Diagnosis not present

## 2023-06-08 ENCOUNTER — Other Ambulatory Visit (HOSPITAL_COMMUNITY): Payer: Commercial Managed Care - PPO | Admitting: Licensed Clinical Social Worker

## 2023-06-08 ENCOUNTER — Other Ambulatory Visit (HOSPITAL_COMMUNITY): Payer: Commercial Managed Care - PPO

## 2023-06-08 DIAGNOSIS — G4700 Insomnia, unspecified: Secondary | ICD-10-CM

## 2023-06-08 DIAGNOSIS — F332 Major depressive disorder, recurrent severe without psychotic features: Secondary | ICD-10-CM

## 2023-06-08 DIAGNOSIS — Z634 Disappearance and death of family member: Secondary | ICD-10-CM

## 2023-06-08 DIAGNOSIS — R4589 Other symptoms and signs involving emotional state: Secondary | ICD-10-CM

## 2023-06-08 NOTE — Progress Notes (Signed)
Spoke with patient via Teams video call, used 2 identifiers to correctly identify patient. States that groups are going great and she is feeling a lot better. She is starting IOP next week and will do grief counseling and trauma counseling at the end of the month after IOP. On scale 1-10 as 10 being worst she rates depression at 5 and anxiety at 5. Denies SI/HI or AV hallucinations. PHQ9=6. Pleasant, smiling, with appropriate affect. Very appreciative of all the help she has received and looks forward to continuing on with IOP. No issues or complaints.

## 2023-06-08 NOTE — Progress Notes (Addendum)
Virtual Visit via Video Note  I connected with Emily Park on 06/08/23 at  9:00 AM EDT by a video enabled telemedicine application and verified that I am speaking with the correct person using two identifiers.  Location: Patient: Home Provider: Office    I discussed the limitations of evaluation and management by telemedicine and the availability of in person appointments. The patient expressed understanding and agreed to proceed.    I discussed the assessment and treatment plan with the patient. The patient was provided an opportunity to ask questions and all were answered. The patient agreed with the plan and demonstrated an understanding of the instructions.   The patient was advised to call back or seek an in-person evaluation if the symptoms worsen or if the condition fails to improve as anticipated.  I provided 15 minutes of non-face-to-face time during this encounter.   [name], NP   [facility] Partial Hospitalization  Outpatient Program Discharge Summary  Emily Park [id number]  Admission date: 05/17/2023 Discharge date: 06/08/2023  Reason for admission: Emily Park with warm started partial hospitalization programming after recent hospitalization due to suicidal attempt and worsening mood.  She carries a history related to major depressive disorder, generalized anxiety disorder and self-reported attention deficit disorder.  Currently she is prescribed Zoloft 100 mg,  Minipress 2 mg nightly gabapentin 100 mg nightly, Trintellix 10 mg daily   Progress in Program Toward Treatment Goals: Progressing patient has a attended participated with daily group session with active and engaged participation.  She presents with a brighter affect than on previous assessments.  States she is feeling upbeat and optimistic.  States she recently visited at her gynecologist and was initiated on estrogen/progesterone for menopausal symptoms.  She reports a good appetite.   States she is resting well throughout the night.  Reports she continues to take gabapentin and prazosin as directed.  Has plans to stepdown to intensive outpatient programming late next week.  No concerns related to medication refills  Progress (rationale): Stepdown to intensive outpatient programming on 06/14/2023  Collaboration of Care: Other psychiatrists [facility] Take all of you medications as prescribed by your mental healthcare provider.  Report any adverse effects and reactions from your medications to your outpatient provider promptly.  Do not engage in alcohol and or illegal drug use while on prescription medicines. Keep all scheduled appointments. This is to ensure that you are getting refills on time and to avoid any interruption in your medication.  If you are unable to keep an appointment call to reschedule.  Be sure to follow up with resources and follow ups given. In the event of worsening symptoms call the crisis hotline, 911, and or go to the nearest emergency department for appropriate evaluation and treatment of symptoms. Follow-up with your primary care provider for your medical issues, concerns and or health care needs.    Patient/Guardian was advised Release of Information must be obtained prior to any record release in order to collaborate their care with an outside provider. Patient/Guardian was advised if they have not already done so to contact the registration department to sign all necessary forms in order for [name] to release information regarding their care.   Consent: Patient/Guardian gives verbal consent for treatment and assignment of benefits for services provided during this visit. Patient/Guardian expressed understanding and agreed to proceed.   [name], NP 06/08/2023

## 2023-06-11 ENCOUNTER — Encounter (HOSPITAL_COMMUNITY): Payer: Self-pay

## 2023-06-11 NOTE — Therapy (Signed)
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   Anxiety 02/24/2021   Heart murmur 02/24/2021   Menorrhagia 02/24/2021   Thyroid nodule 10/26/2020   Acute renal failure superimposed on stage 2 chronic kidney disease (HCC)    Hypotension    Hypovolemic shock (HCC) 08/29/2020   Rectal prolapse 04/05/2020   Migraine 02/27/2019   Mild episode of recurrent major depressive disorder (HCC) 10/25/2018   Abnormal thyroid stimulating hormone (TSH) level 10/24/2018   Syncope and collapse 10/10/2018   Lung  nodules 11/14/2015   Paroxysmal SVT (supraventricular tachycardia) (HCC) 11/13/2015   Attention deficit hyperactivity disorder (ADHD) 11/13/2015    Ted Mcalpine, OT 06/11/2023, 9:06 AM  Kerrin Champagne, OT   [facility] HOSPITALIZATION PROGRAM 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   Anxiety 02/24/2021   Heart murmur 02/24/2021   Menorrhagia 02/24/2021   Thyroid nodule 10/26/2020   Acute renal failure superimposed on stage 2 chronic kidney disease (HCC)    Hypotension    Hypovolemic shock (HCC) 08/29/2020   Rectal prolapse 04/05/2020   Migraine 02/27/2019   Mild episode of recurrent major depressive disorder (HCC) 10/25/2018   Abnormal thyroid stimulating hormone (TSH) level 10/24/2018   Syncope and collapse 10/10/2018   Lung  nodules 11/14/2015   Paroxysmal SVT (supraventricular tachycardia) (HCC) 11/13/2015   Attention deficit hyperactivity disorder (ADHD) 11/13/2015    Ted Mcalpine, OT 06/11/2023, 9:06 AM  Kerrin Champagne, OT   [facility] HOSPITALIZATION PROGRAM [address] [place], [place], [address] Phone: [phone]   Fax:  [phone]  Name: Emily Park MRN: [id number] Date of Birth: 03/05/1971  [facility] PARTIAL HOSPITALIZATION PROGRAM [address] [place], [place], [address] Phone: [phone]   Fax:  [phone]  Occupational Therapy Treatment Virtual Visit via Video Note  I connected with Emily Park on 06/11/23 at  8:00 AM EDT by a video enabled telemedicine application and verified that I am speaking with the correct person using two identifiers.  Location: Patient: home Provider: office   I discussed the limitations of evaluation and management by telemedicine and the availability of in person appointments. The patient expressed understanding and agreed to proceed.    The patient was advised to call back or seek an in-person evaluation if the symptoms worsen or if the condition fails to improve as anticipated.  I provided 55 minutes of non-face-to-face time during this encounter.  Patient Details  Name: Emily Park MRN: [id number] Date of Birth: 09-20-1970 No data recorded  Encounter Date: 06/08/2023   OT End of Session - 06/11/23 0906     Visit Number 6    Number of Visits 20    Date for OT Re-Evaluation 06/16/23    OT Start Time 1200    OT Stop Time 1255    OT Time Calculation (min) 55 min             Past Medical History:  Diagnosis Date   Abnormal TSH    ADD (attention deficit disorder)    Anemia    Anxiety    Arrhythmia    Decreased libido    Depression    Heart murmur    Hyperlipidemia    Hypokalemia    Migraine    Nephrolithiasis    Pap smear abnormality of cervix/human papillomavirus (HPV) positive    Sleep difficulties    SVT (supraventricular tachycardia) (HCC)     Past Surgical History:  Procedure Laterality Date   ABLATION     Cardiac ablation, endometrial ablation   BREAST LUMPECTOMY     right breast   BREAST LUMPECTOMY Right    CARDIOVERSION     COLONOSCOPY WITH PROPOFOL N/A 05/16/2021   Procedure: COLONOSCOPY WITH PROPOFOL;  Surgeon: Charlott Rakes, MD;  [facility];   Service: Endoscopy;  Laterality: N/A;   COLPOSCOPY     w/ cervical biopsy   DILITATION & CURRETTAGE/HYSTROSCOPY WITH NOVASURE ABLATION N/A 09/11/2014   Procedure: DILATATION & CURETTAGE/HYSTEROSCOPY WITH NOVASURE ABLATION;  Surgeon: Lenoard Aden, MD;  Location: [facility];  Service: Gynecology;  Laterality: N/A;   GIVENS CAPSULE STUDY N/A 05/16/2021   Procedure: GIVENS CAPSULE STUDY;  Surgeon: Charlott Rakes, MD;  [facility];  Service: Endoscopy;  Laterality: N/A;   XI ROBOT ASSISTED RECTOPEXY N/A 07/07/2021   Procedure: XI ROBOT ASSISTED RECTOPEXY;  Surgeon: Karie Soda, MD;  Location: [facility];  Service: General;  Laterality: N/A;   XI ROBOTIC ASSISTED LOWER ANTERIOR RESECTION N/A 07/07/2021   Procedure: XI ROBOTIC ASSISTED LOW ANTERIOR RECTOSIGMOID RESECTION WITH RIGID PROCTOSCOPY, TAP BLOCK;  Surgeon: Karie Soda, MD;  Location: [facility];  Service: General;  Laterality: N/A;    There were no vitals filed for this visit.   Subjective Assessment - 06/11/23 0905     Currently in Pain? No/denies    Pain Score 0-No pain                Group Session:  S: Doing better today  O: During the group therapy session, the occupational therapist discussed the impact of sleep disturbances on daily activities and overall health and wellbeing.

## 2023-06-14 ENCOUNTER — Other Ambulatory Visit (HOSPITAL_COMMUNITY): Payer: Commercial Managed Care - PPO | Attending: Psychiatry | Admitting: Psychiatry

## 2023-06-14 ENCOUNTER — Encounter (HOSPITAL_COMMUNITY): Payer: Self-pay | Admitting: Psychiatry

## 2023-06-14 DIAGNOSIS — F411 Generalized anxiety disorder: Secondary | ICD-10-CM | POA: Insufficient documentation

## 2023-06-14 DIAGNOSIS — Z79899 Other long term (current) drug therapy: Secondary | ICD-10-CM | POA: Insufficient documentation

## 2023-06-14 DIAGNOSIS — Z9151 Personal history of suicidal behavior: Secondary | ICD-10-CM | POA: Insufficient documentation

## 2023-06-14 DIAGNOSIS — F332 Major depressive disorder, recurrent severe without psychotic features: Secondary | ICD-10-CM | POA: Diagnosis present

## 2023-06-14 DIAGNOSIS — Z636 Dependent relative needing care at home: Secondary | ICD-10-CM | POA: Insufficient documentation

## 2023-06-14 DIAGNOSIS — R4589 Other symptoms and signs involving emotional state: Secondary | ICD-10-CM | POA: Diagnosis not present

## 2023-06-14 DIAGNOSIS — F4310 Post-traumatic stress disorder, unspecified: Secondary | ICD-10-CM | POA: Diagnosis not present

## 2023-06-14 NOTE — Progress Notes (Signed)
Virtual Visit via Video Note  I connected with Emily Park on 06/14/23 at  9:00 AM EDT by a video enabled telemedicine application and verified that I am speaking with the correct person using two identifiers.  Location: Patient: Home Provider: Office   I discussed the limitations of evaluation and management by telemedicine and the availability of in person appointments. The patient expressed understanding and agreed to proceed.  I discussed the assessment and treatment plan with the patient. The patient was provided an opportunity to ask questions and all were answered. The patient agreed with the plan and demonstrated an understanding of the instructions.   The patient was advised to call back or seek an in-person evaluation if the symptoms worsen or if the condition fails to improve as anticipated.  I provided 15 minutes of non-face-to-face time during this encounter.   [name], NP    Psychiatric Initial Adult Assessment   Patient Identification: Emily Park MRN:  [id number] Date of Evaluation:  06/14/2023 Referral Source: Stepping down from partial hospitalization program Chief Complaint: Worsening depression and anxiety Visit Diagnosis:    ICD-10-CM   1. MDD (major depressive disorder), recurrent severe, without psychosis (HCC)  F33.2     2. Difficulty coping  R45.89       History of Present Illness: Emily Park recently completed partial hospitalization programming and is currently stepping down to intensive outpatient programming.  She was initiated on Minipress for reported intrusive thoughts and nightmares which she reports she has been taking and tolerating well.  Discontinue clonidine and initiated gabapentin for sleep reported issues.  States overall her mood was improved.  Emily Park appeared to be under better spirits as she reports a good prognosis after following up with her husband's cancer team providers on yesterday in [place].  She reports she  has been taking her medications as indicated.  Per initial admission assessment note: "Emily Park with warm started partial hospitalization programming after recent hospitalization due to suicidal attempt and worsening mood.  She carries a history related to major depressive disorder, generalized anxiety disorder and self-reported attention deficit disorder.  Currently she is prescribed Zoloft 100 mg,  Minipress 2 mg nightly gabapentin 100 mg nightly, Trintellix 10 mg daily."    Evaluation by Psychiatrist Mercy Riding:" patient reported struggling with neurovegetative symptoms of depression including low mood, anhedonia, amotivation, disturbed sleep, hopelessness, feelings of worthlessness, and negative self thoughts. She denied any SI/HI or thoughts of self-harm though had acted on suicidal thoughts in May 2024. Crisis resources were reviewed and patient has completed safety planning. Medications are managed by her husband and she is able to list a number of supports she can reach out to if needed. Patient also struggled with symptoms of anxiety including excessive worry, fear of something awful happening, restlessness, palpitations, chest pressure, and shortness of breath. At times the anxiety can progress to panic episodes. Patient does have a past trauma history from which she experiences nightmares and hypervigilance though she denies any other symptoms consistent with PTSD."   During evaluation Emily Park is sitting with family cat sitting on her lap;she is alert/oriented x 3; calm/cooperative; and mood congruent with affect.  Patient is speaking in a clear tone at moderate volume, and normal pace; with good eye contact. Her thought process is coherent and relevant; There is no indication that she is currently responding to internal/external stimuli or experiencing delusional thought content.  Patient denies suicidal/self-harm/homicidal ideation, psychosis, and paranoia.  Patient has remained calm  throughout  assessment and has answered questions appropriately.  Patient to start intensive outpatient programming on 06/14/2023   Associated Signs/Symptoms: Depression Symptoms:  depressed mood, difficulty concentrating, anxiety, (Hypo) Manic Symptoms:  Distractibility, Anxiety Symptoms:  Excessive Worry, Psychotic Symptoms:  Hallucinations: None PTSD Symptoms: Re-experiencing:  Nightmares  Past Psychiatric History: Carries a diagnosis with depression and anxiety posttraumatic stress disorder difficulty coping unresolved grief and loss.  Has had multiple inpatient admissions.  Previous Psychotropic Medications: Yes   Substance Abuse History in the last 12 months:  No.  Consequences of Substance Abuse: NA  Past Medical History:  Past Medical History:  Diagnosis Date   Abnormal TSH    ADD (attention deficit disorder)    Anemia    Anxiety    Arrhythmia    Decreased libido    Depression    Heart murmur    Hyperlipidemia    Hypokalemia    Migraine    Nephrolithiasis    Pap smear abnormality of cervix/human papillomavirus (HPV) positive    Sleep difficulties    SVT (supraventricular tachycardia) (HCC)     Past Surgical History:  Procedure Laterality Date   ABLATION     Cardiac ablation, endometrial ablation   BREAST LUMPECTOMY     right breast   BREAST LUMPECTOMY Right    CARDIOVERSION     COLONOSCOPY WITH PROPOFOL N/A 05/16/2021   Procedure: COLONOSCOPY WITH PROPOFOL;  Surgeon: Charlott Rakes, MD;  Location: [facility];  Service: Endoscopy;  Laterality: N/A;   COLPOSCOPY     w/ cervical biopsy   DILITATION & CURRETTAGE/HYSTROSCOPY WITH NOVASURE ABLATION N/A 09/11/2014   Procedure: DILATATION & CURETTAGE/HYSTEROSCOPY WITH NOVASURE ABLATION;  Surgeon: Lenoard Aden, MD;  Location: [facility];  Service: Gynecology;  Laterality: N/A;   GIVENS CAPSULE STUDY N/A 05/16/2021   Procedure: GIVENS CAPSULE STUDY;  Surgeon: Charlott Rakes, MD;  Location: [facility];  Service:  Endoscopy;  Laterality: N/A;   XI ROBOT ASSISTED RECTOPEXY N/A 07/07/2021   Procedure: XI ROBOT ASSISTED RECTOPEXY;  Surgeon: Karie Soda, MD;  Location: [facility];  Service: General;  Laterality: N/A;   XI ROBOTIC ASSISTED LOWER ANTERIOR RESECTION N/A 07/07/2021   Procedure: XI ROBOTIC ASSISTED LOW ANTERIOR RECTOSIGMOID RESECTION WITH RIGID PROCTOSCOPY, TAP BLOCK;  Surgeon: Karie Soda, MD;  Location: [facility];  Service: General;  Laterality: N/A;    Family Psychiatric History:   Family History:  Family History  Problem Relation Age of Onset   Hypothyroidism Mother    Depression Mother    Cancer Mother        thyroid   Thyroid disease Mother    Alcohol abuse Father    Hypertension Father    Heart attack Father        Age 14   AAA (abdominal aortic aneurysm) Father    Depression Sister    Hypothyroidism Sister    Hyperthyroidism Sister    Diabetes Maternal Grandfather    Depression Maternal Grandmother    Breast cancer Paternal Grandmother    Depression Cousin     Social History:   Social History   Socioeconomic History   Marital status: Married    Spouse name: Not on file   Number of children: 2   Years of education: college   Highest education level: Associate degree: academic program  Occupational History   Occupation: Charity fundraiser with Engineer, manufacturing systems: [employer]   Tobacco Use   Smoking status: Never    Passive exposure: Current   Smokeless tobacco: Never  Vaping Use   Vaping status: Never Used  Substance and Sexual Activity   Alcohol use: Not Currently    Alcohol/week: 2.0 standard drinks of alcohol    Types: 2 Glasses of wine per week   Drug use: Not Currently   Sexual activity: Yes    Partners: Male    Comment: Married  Other Topics Concern   Not on file  Social History Narrative   Lives at home with two sons.   Right-handed.   60 ounces of tea and soda per day.   Social Determinants of Health   Financial Resource Strain: Low Risk  (10/16/2018)   Received  from [facility] visits prior to 10/28/2022., [facility] visits prior to 10/28/2022.   Overall Financial Resource Strain (CARDIA)    Difficulty of Paying Living Expenses: Not hard at all  Food Insecurity: Food Insecurity Present (12/28/2022)   Hunger Vital Sign    Worried About Running Out of Food in the Last Year: Sometimes true    Ran Out of Food in the Last Year: Sometimes true  Transportation Needs: No Transportation Needs (12/28/2022)   PRAPARE - Administrator, Civil Service (Medical): No    Lack of Transportation (Non-Medical): No  Physical Activity: Not on file  Stress: Not on file  Social Connections: Unknown (01/12/2022)   Received from [facility]   Social Network    Social Network: Not on file    Additional Social History:   Allergies:   Allergies  Allergen Reactions   Zithromax [Azithromycin] Rash    Metabolic Disorder Labs: Lab Results  Component Value Date   HGBA1C 4.9 12/20/2022   MPG 93.93 12/20/2022   MPG 93.93 07/06/2021   No results found for: "PROLACTIN" Lab Results  Component Value Date   CHOL 208 (H) 02/24/2014   TRIG 81.0 02/24/2014   HDL 54.90 02/24/2014   CHOLHDL 4 02/24/2014   VLDL 16.2 02/24/2014   LDLCALC 137 (H) 02/24/2014   Lab Results  Component Value Date   TSH 0.18 (L) 03/20/2023    Therapeutic Level Labs: No results found for: "LITHIUM" No results found for: "CBMZ" No results found for: "VALPROATE"  Current Medications: Current Outpatient Medications  Medication Sig Dispense Refill   ACCRUFER 30 MG CAPS Take 1 capsule (30 mg total) by mouth 2 (two) times daily. 90 capsule 0   Cholecalciferol (VITAMIN D3) 50 MCG (2000 UT) capsule Take 2,000 Units by mouth daily.     clonazePAM (KLONOPIN) 1 MG disintegrating tablet Take 1 tablet (1 mg total) by mouth at bedtime. 30 tablet 2   gabapentin (NEURONTIN) 300 MG capsule Take 1 capsule (300 mg total) by mouth at  bedtime. 30 capsule 0   hydrocortisone (ANUSOL-HC) 2.5 % rectal cream Apply 1 Application topically 4 (four) times daily as needed for hemorrhoids. 30 g 0   linaclotide (LINZESS) 145 MCG CAPS capsule Take 1 capsule (145 mcg total) by mouth daily before breakfast. 30 capsule 0   loratadine (CLARITIN) 10 MG tablet Take 1 tablet (10 mg total) by mouth daily as needed for allergies. (Patient not taking: Reported on 01/19/2023) 30 tablet 0   mirtazapine (REMERON) 7.5 MG tablet Take 1 tablet (7.5 mg total) by mouth at bedtime. 30 tablet 2   Multiple Vitamin (MULTIVITAMIN WITH MINERALS) TABS tablet Take 1 tablet by mouth every morning.     pantoprazole (PROTONIX) 40 MG tablet Take 1 tablet (40 mg total) by mouth  2 (two) times daily. 180 tablet 3   polyethylene glycol (MIRALAX / GLYCOLAX) 17 g packet Take 17 g by mouth 2 (two) times daily. (Patient taking differently: Take 17 g by mouth daily as needed for moderate constipation.) 14 each 0   polyvinyl alcohol (LIQUIFILM TEARS) 1.4 % ophthalmic solution Place 1 drop into both eyes as needed for dry eyes. 15 mL 0   potassium chloride SA (KLOR-CON M20) 20 MEQ tablet Take 1 tablet (20 mEq total) by mouth 2 (two) times daily. 180 tablet 1   prazosin (MINIPRESS) 1 MG capsule Take 1 capsule (1 mg total) by mouth at bedtime. 30 capsule 0   rizatriptan (MAXALT) 10 MG tablet PLEASE SEE ATTACHED FOR DETAILED DIRECTIONS     sertraline (ZOLOFT) 100 MG tablet Take 1.5 tablets (150 mg total) by mouth daily. 45 tablet 0   Sodium Fluoride (PREVIDENT 5000 BOOSTER PLUS) 1.1 % PSTE As directed tid 100 mL 3   topiramate (TOPAMAX) 100 MG tablet Take 1 tablet (100 mg total) by mouth daily. 90 tablet 1   No current facility-administered medications for this visit.    Musculoskeletal: Strength & Muscle Tone: within normal limits Gait & Station: normal Patient leans: N/A  Psychiatric Specialty Exam: Review of Systems  There were no vitals taken for this visit.There is no  height or weight on file to calculate BMI.  General Appearance: Casual  Eye Contact:  Good  Speech:  Clear and Coherent  Volume:  Normal  Mood:  Anxious and Depressed  Affect:  Congruent  Thought Process:  Coherent  Orientation:  Full (Time, Place, and Person)  Thought Content:  Logical  Suicidal Thoughts:  No  Homicidal Thoughts:  No  Memory:  Immediate;   Good Recent;   Good  Judgement:  Good  Insight:  Good  Psychomotor Activity:  Normal  Concentration:  Concentration: Good  Recall:  Good  Fund of Knowledge:Good  Language: Good  Akathisia:  No  Handed:  Right  AIMS (if indicated):  done  Assets:  Communication Skills Desire for Improvement Resilience Social Support  ADL's:  Intact  Cognition: WNL  Sleep:  Fair   Screenings: AIMS    Flowsheet Row [facility]  AIMS Total Score 0      AUDIT    Flowsheet Row Ad[facility] ([facility]  Alcohol Use Disorder Identification Test Final Score (AUDIT) 1      GAD-7    Flowsheet Row Office Visit from 03/20/2023 in [facility] [place] Counselor from 01/09/2023 in [facility] Office Visit from 11/22/2022 in [facility]  Total GAD-7 Score 7 15 12       PHQ2-9    Flowsheet Row Counselor from 06/08/2023 in [facility] Counselor from 06/01/2023 in [facility] Counselor from 05/03/2023 in [facility]ITAL[facility] PROGRAM Office Visit from 03/20/2023 in [facility] [place] Counselor from 02/05/2023 in [facility]OSPITALIZATION PROGRAM  PHQ-2 Total Score 2 2 4 4 1   PHQ-9 Total Score 6 17 14 14 2       Flowsheet Row Counselor from 06/01/2023 in [facility]OSPITALIZATION PROGRAM Counselor from 05/03/2023 in [facility]ALTH [facility]OSPITAL[facility]ATION PROGRAM Office [facility]  C-SSRS RISK CATEGORY No Risk Error: Question 2 not  populated Moderate Risk       Assessment and Plan:  Patient to start Intensive outpatient programming (IOP) Continue medications as indicated  Collaboration of Care: Psychiatrist AEB Mercy Riding  Patient/Guardian was advised Release of Information must be obtained prior to any record release in order to collaborate their care with an outside provider. Patient/Guardian was advised if they have not already done so to contact the registration department to sign all necessary forms in order for [name] to release information regarding their care.   Consent: Patient/Guardian gives verbal consent for treatment and assignment of benefits for services provided during this visit. Patient/Guardian expressed understanding and agreed to proceed.   [name], NP 10/17/20249:18 AM

## 2023-06-14 NOTE — Progress Notes (Signed)
Virtual Visit via Video Note   I connected with Emily Park. Flow on 06/14/23 at  9:00 AM EDT by a video enabled telemedicine application and verified that I am speaking with the correct person using two identifiers.   At orientation to the IOP program, Case Manager discussed the limitations of evaluation and management by telemedicine and the availability of in person appointments. The patient expressed understanding and agreed to proceed with virtual visits throughout the duration of the program.   Location:  Patient: Patient Home Provider: Home Office   History of Present Illness: MDD    Observations/Objective: Check In: Case Manager checked in with all participants to review discharge dates, insurance authorizations, work-related documents and needs from the treatment team regarding medications. Emily Park stated needs and engaged in discussion.    Initial Therapeutic Activity: Counselor facilitated a check-in with Emily Park to assess for safety, sobriety and medication compliance.  Counselor also inquired about Emily Park's current emotional ratings, as well as any significant changes in thoughts, feelings or behavior since previous check in.  Emily Park presented for session on time and was alert, oriented x5, with no evidence or self-report of active SI/HI or A/V H.  Emily Park reported compliance with medication and denied use of alcohol or illicit substances.  Emily Park reported scores of 4/10 for depression, 5/10 for anxiety, and 0/10 for anger/irritability.  Emily Park denied any recent outbursts or panic attacks.  Emily Park reported that a struggle has been dealing with grief, anxiety, and trauma, although she successfully completed PHP, and found that support and education helpful.  Emily Park reported that her goal today is to go through some of her father's belongings and then take a walk with her dog.      Second Therapeutic Activity: Counselor introduced topic of stress management today.  Counselor provided definition of stress as  feeling tense, overwhelmed, worn out, and/or exhausted, and noted that in small amounts, stress can be motivating until things become too overwhelming to manage.  Counselor also explained how stress can be acute (brief but intense) or chronic (long-lasting) and this can impact the severity of symptoms one can experience in the physical, emotional, and behavioral categories.  Counselor inquired about members' specific stressors, how long they have been prevalent, and the various symptoms that tend to manifest as a result.  Counselor also offered several stress management strategies to help improve members' coping ability, including journaling, gratitude practice, relaxation techniques, and time management tips.  Counselor also explained that research has shown a strong support network composed of trusted family, friends, or community members can increase resilience in times of stress, and inquired about who members can reach out to for help in managing stressors.  Counselor encouraged members to consider discussing stressor 'red flags' with their close supports that can be monitored and strategies for assisting them in times of crisis.  Intervention was effective, as evidenced by Emily Park actively participating in discussion on subject, reporting that her most significant stressors include keeping healthy, lack of confidence, loneliness, money worries, planning for retirement, conflict with family, and world economy.  Emily Park reported that her stress management goal is to set more time aside for self-care activities, like eating, exercise, and hobbies.  Emily Park also expressed receptiveness to several stress management strategies practiced today in session, including deep breathing, and progressive muscle relaxation.    Assessment and Plan: Counselor recommends that Emily Park remain in IOP treatment to better manage mental health symptoms, ensure stability and pursue completion of treatment plan goals. Counselor recommends  adherence  to crisis/safety plan, taking medications as prescribed, and following up with medical professionals if any issues arise.    Follow Up Instructions: Counselor will send Webex link for session tomorrow.  Emily Park was advised to call back or seek an in-person evaluation if the symptoms worsen or if the condition fails to improve as anticipated.   Collaboration of Care:   Medication Management AEB Dr. Alfonse Flavors or Hillery Jacks, NP                                          Case Manager AEB Emily Modena, CNA    Patient/Guardian was advised Release of Information must be obtained prior to any record release in order to collaborate their care with an outside provider. Patient/Guardian was advised if they have not already done so to contact the registration department to sign all necessary forms in order for [name] to release information regarding their care.    Consent: Patient/Guardian gives verbal consent for treatment and assignment of benefits for services provided during this visit. Patient/Guardian expressed understanding and agreed to proceed.   I provided 180 minutes of non-face-to-face time during this encounter.   Emily Stain, LCSW, LCAS 06/14/23

## 2023-06-14 NOTE — Progress Notes (Signed)
Virtual Visit via Video Note  I connected with Emily Park on @TODAY @ at  9:00 AM EDT by a video enabled telemedicine application and verified that I am speaking with the correct person using two identifiers.  Location: Patient: at home Provider: at office   I discussed the limitations of evaluation and management by telemedicine and the availability of in person appointments. The patient expressed understanding and agreed to proceed.  I discussed the assessment and treatment plan with the patient. The patient was provided an opportunity to ask questions and all were answered. The patient agreed with the plan and demonstrated an understanding of the instructions.   The patient was advised to call back or seek an in-person evaluation if the symptoms worsen or if the condition fails to improve as anticipated.  I provided 30 minutes of non-face-to-face time during this encounter.   Jeri Modena, M.Ed,CNA   Patient ID: Emily Park, female   DOB: 12-Sep-1970, 52 y.o.   MRN: [id number] D:   As per previous CCA states:  "Emily Park is a 52yo female referred to [facility] by her psychiatrist, Dr. Mercy Riding, for worsening depressive symptoms following the death of her father. She reports her father died by suicide via gunshot a week ago [place] and that he is the first person she has ever lost. Other stressors include her complex medical history and being unable to work due to medical issues. She previously saw Dr. Milagros Evener for med man for 11 years and has been diagnosed with ADHD, anxiety, depression, and OCPD. She has since started seeing Dr. Mercy Riding within this office for med man. She endorses one suicide attempt earlier this year prior to her only psychiatric hospitalization. She denies NSSI, substance abuse, HI, AVH, and current SI. She reports a great aunt and cousin died by suicide. She cites her two good friends, husband, sons, sisters, mother, and husband's family as her  supports. She denies significant psychosocial changes since her first PHP assessment. She currently lives with her husband and her 25yo son lives with them part time. She reports complex medical history to include recent GI ulcers, IBS, anemia, and hx of rectal prolapse. She also reports she has recently been diagnosed with stage 3 kidney failure and has experienced nocturnal enuresis as a result. She states there are no firearms in her home."  Pt stepped down from virtual PHP to virtual MH-IOP today.  Pt was previously in MH-IOP (02-08-23 thru 03-06-23) d/t depression/anxiety. Pt states her husband will be starting chemo treatments soon at [facility].  Reports she really likes his new team of doctors.  Pt scored 9 on PHQ-9.  On a scale of 1-10 (10 being the worst), pt rates her depression and anxiety at a 4.  Denies SI/HI or A/V hallucinations.   Pt reports she is looking forward to meeting with a therapist the end of the month. A: Re-oriented pt.  Pt was advised of ROI must be obtained prior to any records release in order to collaborate her care with an outside provider.  Pt was advised if she has not already done so to contact the front desk to sign all necessary forms in order for MH-IOP to release info re: her care.  Consent:  Pt gives verbal consent for tx and assignment of benefits for services provided during this telehealth group process.  Pt expressed understanding and agreed to proceed. Collaboration of care:  Collaborate with Dr. Lamar Sprinkles AEB, Dr. Everlena Cooper,  AEB; Hillery Jacks, NP AEB; [name], LCSW AEB. Encouraged support groups through [facility].  Pt will improve her mood as evidenced by being happy again, managing her mood and coping with daily stressors for 5 out of 7 days for 60 days.  R:  Pt receptive.  Jeri Modena, M.Ed,CNA

## 2023-06-15 ENCOUNTER — Other Ambulatory Visit (HOSPITAL_COMMUNITY): Payer: Commercial Managed Care - PPO | Admitting: Psychiatry

## 2023-06-15 DIAGNOSIS — F332 Major depressive disorder, recurrent severe without psychotic features: Secondary | ICD-10-CM | POA: Diagnosis not present

## 2023-06-15 NOTE — Progress Notes (Signed)
Virtual Visit via Video Note   I connected with Emily Park on 06/15/23 at  9:00 AM EDT by a video enabled telemedicine application and verified that I am speaking with the correct person using two identifiers.   At orientation to the IOP program, Case Manager discussed the limitations of evaluation and management by telemedicine and the availability of in person appointments. The patient expressed understanding and agreed to proceed with virtual visits throughout the duration of the program.   Location:  Patient: Patient Home Provider: OPT BH Office   History of Present Illness: MDD    Observations/Objective: Check In: Case Manager checked in with all participants to review discharge dates, insurance authorizations, work-related documents and needs from the treatment team regarding medications. Emily Park stated needs and engaged in discussion.    Initial Therapeutic Activity: Counselor facilitated a check-in with Emily Park to assess for safety, sobriety and medication compliance.  Counselor also inquired about Emily Park's current emotional ratings, as well as any significant changes in thoughts, feelings or behavior since previous check in.  Emily Park presented for session on time and was alert, oriented x5, with no evidence or self-report of active SI/HI or A/V H.  Emily Park reported compliance with medication and denied use of alcohol or illicit substances.  Emily Park reported scores of 5/10 for depression, 6/10 for anxiety, and 0/10 for anger/irritability.  Emily Park denied any recent outbursts or panic attacks.  Emily Park reported that a recent success was getting her nails done and doing some shopping yesterday.  Emily Park reported that a struggle was getting liver results back from a test her husband had, which has been worrying her.  Emily Park reported that her goal this weekend is to spend some time with friends that will be visiting to check in on her husband for support.        Second Therapeutic Activity: Counselor introduced  topic of assertive communication today.  Counselor shared various handouts with members virtually in group to read along with on the subject.  These handouts defined assertive communication as a communication style in which a person stands up for their own needs and wants, while also taking into consideration the needs and wants of others, without behaving in a passive or aggressive way.  Traits of assertive communicators were highlighted such as using appropriate speaking volume, maintaining eye contact, using confident language, and avoiding interruption.  Members were also provided with tips on how to improve communication, including respecting oneself, expressing thoughts and feelings calmly, and saying "No" when necessary.  Members were given a variety of scenarios where they could practice using these tips to respond in an assertive manner.  Intervention was effective, as evidenced by Emily Park participating in discussion on topic, reporting that she has been working on becoming more assertive since starting therapy.  Emily Park reported that her communication style can depend on the person she is dealing with and context, and at times she can still prioritize other people's needs, speak softly, avoid conflict, and lacking confidence.  Emily Park reported that she was very passive in her previous marriage with an alcoholic, and feels like since separating and focusing more on her self-care, she has been able to better vocalize her thoughts, feelings and needs.  Emily Park showed more effective use of assertive communication skills through engagement in roleplay activities.    Third Therapeutic Activity: Psycho-educational portion of group was provided by [name], Interior and spatial designer of community education with [employer].  Emily Park provided information on history of her local agency, mission statement, and  the variety of unique services offered which group members might find beneficial to engage in, including both virtual  and in-person support groups, as well as peer support program for mentoring.  Emily Park offered time to answer member's questions regarding services and encouraged them to consider utilizing these services to assist in working towards their individual wellness goals.  Intervention was effective, as evidenced by Emily Park participating in discussion with speaker on the subject, reporting that she did an assessment with [facility] previously, but never heard anything back about eligibility.  Emily Park was receptive to feedback from speaker on how to reapply, and apology offered for services being halted.  Speaker provided Emily Park with updated contact information and encouraged her to outreach them again for help in getting started.      Assessment and Plan: Counselor recommends that Emily Park remain in IOP treatment to better manage mental health symptoms, ensure stability and pursue completion of treatment plan goals. Counselor recommends adherence to crisis/safety plan, taking medications as prescribed, and following up with medical professionals if any issues arise.    Follow Up Instructions: Counselor will send Webex link for session tomorrow.  Emily Park was advised to call back or seek an in-person evaluation if the symptoms worsen or if the condition fails to improve as anticipated.   Collaboration of Care:   Medication Management AEB Dr. Alfonse Flavors or Hillery Jacks, NP                                          Case Manager AEB Jeri Modena, CNA    Patient/Guardian was advised Release of Information must be obtained prior to any record release in order to collaborate their care with an outside provider. Patient/Guardian was advised if they have not already done so to contact the registration department to sign all necessary forms in order for [name] to release information regarding their care.    Consent: Patient/Guardian gives verbal consent for treatment and assignment of benefits for services provided during this visit.  Patient/Guardian expressed understanding and agreed to proceed.   I provided 180 minutes of non-face-to-face time during this encounter.   [name], LCSW, LCAS 06/15/23

## 2023-06-18 ENCOUNTER — Ambulatory Visit (HOSPITAL_COMMUNITY): Payer: Commercial Managed Care - PPO

## 2023-06-18 ENCOUNTER — Telehealth (HOSPITAL_COMMUNITY): Payer: Self-pay | Admitting: Psychiatry

## 2023-06-19 ENCOUNTER — Other Ambulatory Visit (HOSPITAL_COMMUNITY): Payer: Self-pay | Admitting: Psychiatry

## 2023-06-19 ENCOUNTER — Ambulatory Visit (HOSPITAL_COMMUNITY): Payer: Commercial Managed Care - PPO

## 2023-06-19 DIAGNOSIS — F411 Generalized anxiety disorder: Secondary | ICD-10-CM

## 2023-06-19 DIAGNOSIS — F332 Major depressive disorder, recurrent severe without psychotic features: Secondary | ICD-10-CM

## 2023-06-20 ENCOUNTER — Telehealth (HOSPITAL_COMMUNITY): Payer: Self-pay | Admitting: Psychiatry

## 2023-06-20 ENCOUNTER — Ambulatory Visit: Payer: Commercial Managed Care - PPO | Admitting: Internal Medicine

## 2023-06-20 ENCOUNTER — Other Ambulatory Visit (HOSPITAL_COMMUNITY): Payer: Commercial Managed Care - PPO | Admitting: Psychiatry

## 2023-06-21 ENCOUNTER — Other Ambulatory Visit (HOSPITAL_COMMUNITY): Payer: Commercial Managed Care - PPO

## 2023-06-21 ENCOUNTER — Other Ambulatory Visit (HOSPITAL_COMMUNITY): Payer: Self-pay | Admitting: Psychiatry

## 2023-06-21 DIAGNOSIS — F411 Generalized anxiety disorder: Secondary | ICD-10-CM

## 2023-06-21 DIAGNOSIS — F332 Major depressive disorder, recurrent severe without psychotic features: Secondary | ICD-10-CM

## 2023-06-22 ENCOUNTER — Telehealth (HOSPITAL_COMMUNITY): Payer: Self-pay | Admitting: Psychiatry

## 2023-06-22 ENCOUNTER — Encounter (HOSPITAL_COMMUNITY): Payer: Self-pay | Admitting: Family

## 2023-06-22 ENCOUNTER — Other Ambulatory Visit (HOSPITAL_COMMUNITY): Payer: Commercial Managed Care - PPO | Admitting: Psychiatry

## 2023-06-22 IMAGING — RF DG ESOPHAGUS
11 series · 14 of 24 positions shown · non-contrast
Comparison: None.

CLINICAL DATA: Worsening chronic dysphagia with globus sensation in
the throat.

EXAM:
ESOPHOGRAM / BARIUM SWALLOW / BARIUM TABLET STUDY
TECHNIQUE: Combined double contrast and single contrast examination performed
using effervescent crystals, thick barium liquid, and thin barium
liquid. The patient was observed with fluoroscopy swallowing a 13 mm
barium sulphate tablet.
FLUOROSCOPY:
Radiation Exposure Index (as provided by the fluoroscopic device):
11.2 mGy Kerma

[Series 1: sequence · 2 of 19 frames shown (1 of 8)]
[frame 3/19]
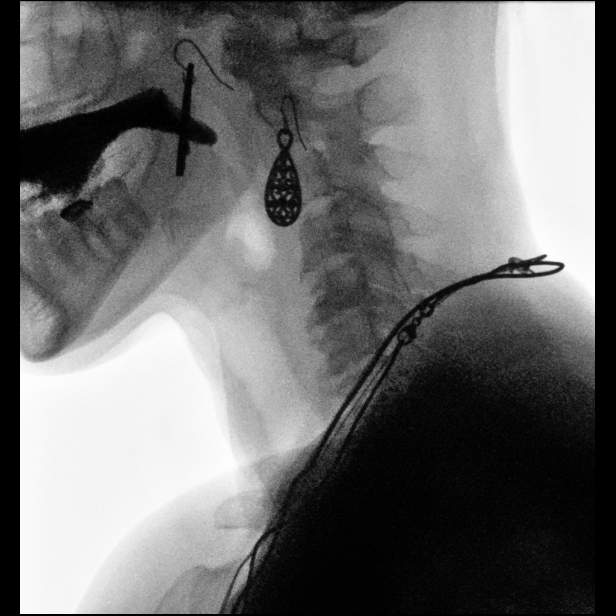
[frame 17/19]
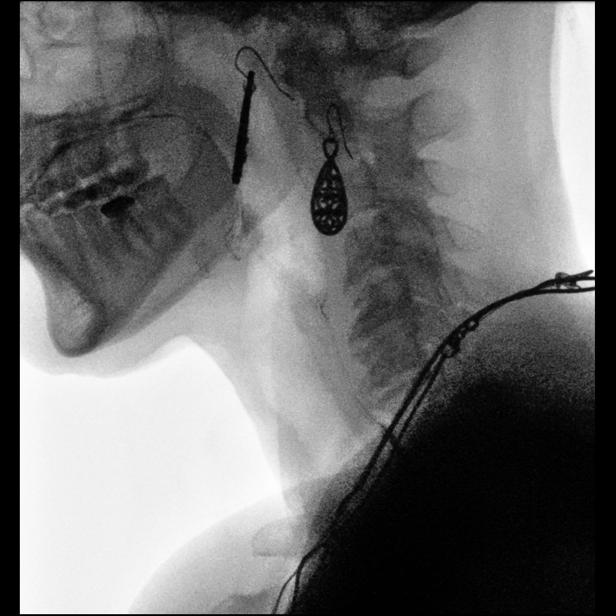

[Series 3: sequence · 1 of 13 frames shown (2 of 8)]
[frame 7/13]
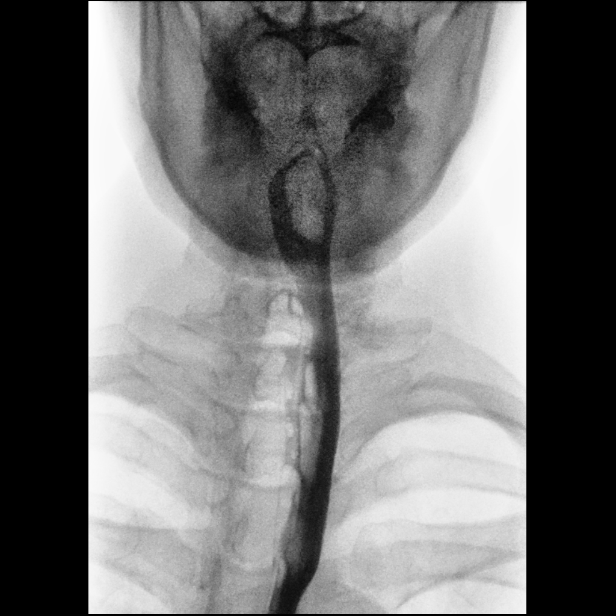

[Series 4: sequence · 2 of 18 frames shown (3 of 8)]
[frame 10/18]
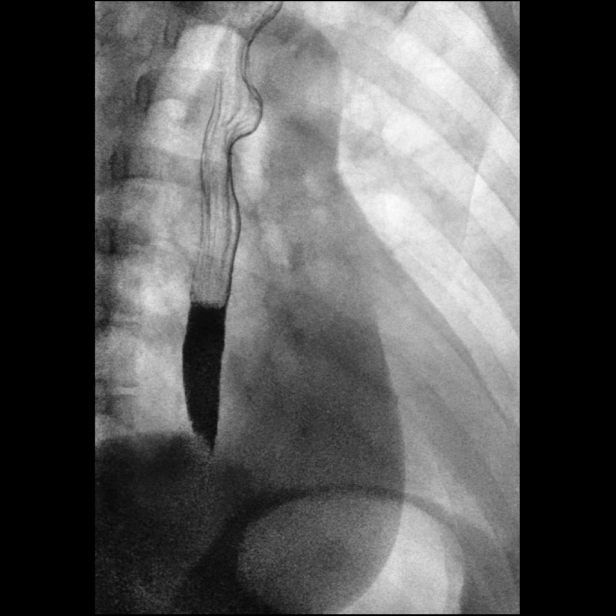
[frame 17/18]
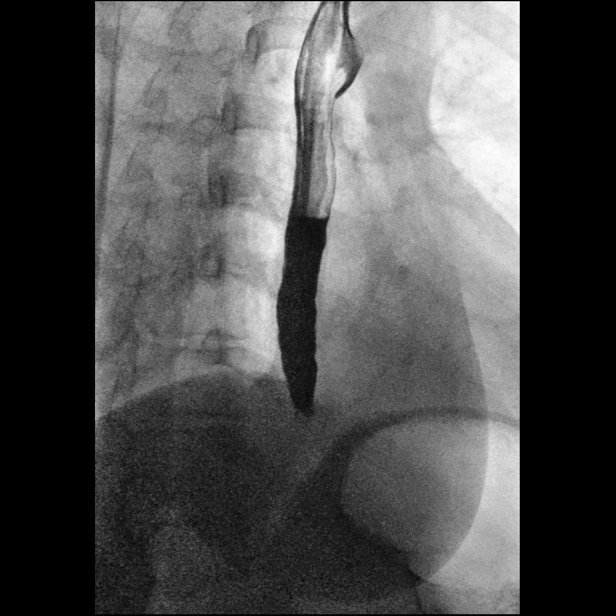

[Series 5: one shot · 0.15mm/px · 1 of 5 slices shown (1 of 3)]
[im 3/5]
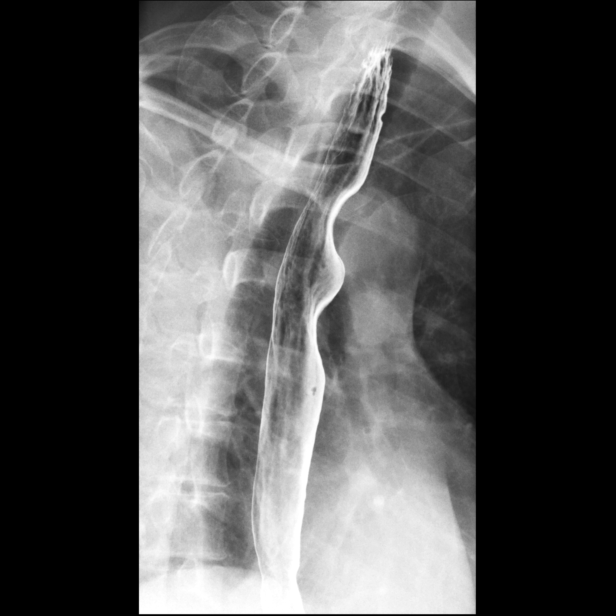

[Series 6: sequence · 2 of 42 frames shown (4 of 8)]
[frame 1/42]
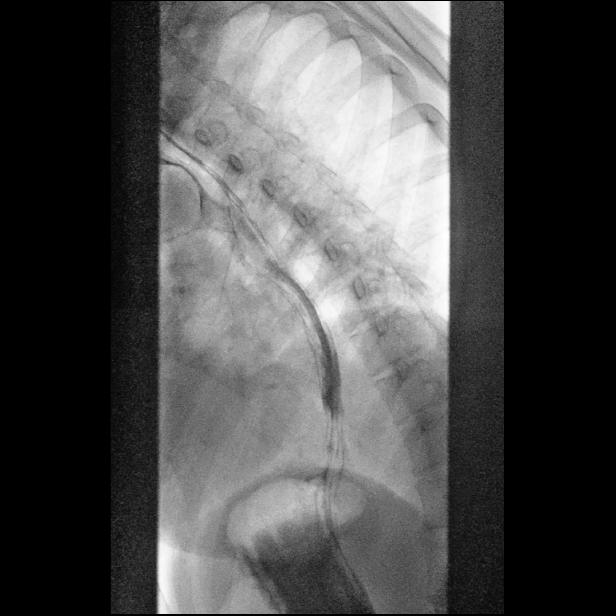
[frame 22/42]
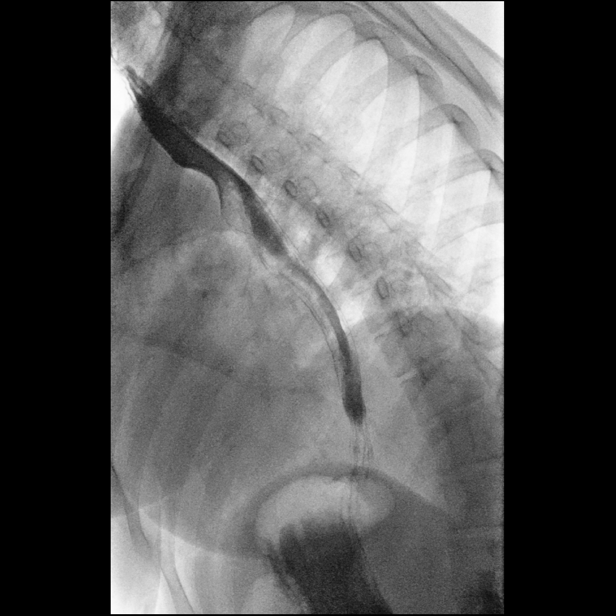

[Series 7: sequence · 1 of 105 frames shown (5 of 8)]
[frame 53/105]
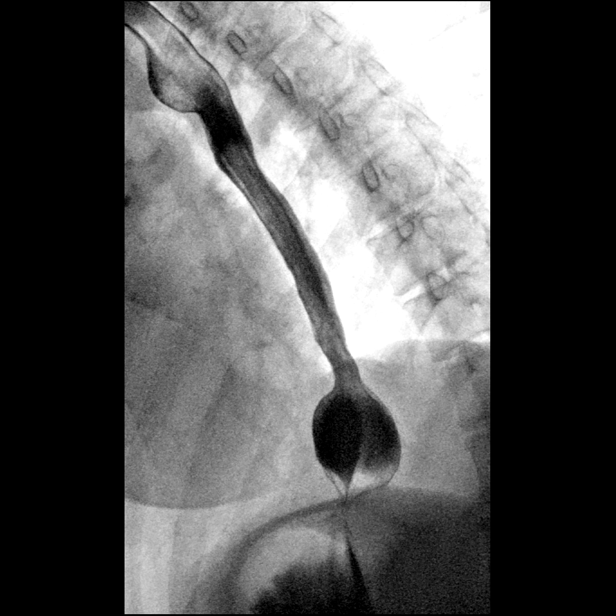

[Series 8: sequence · 1 of 1 slices shown (6 of 8)]
[im 1/1]
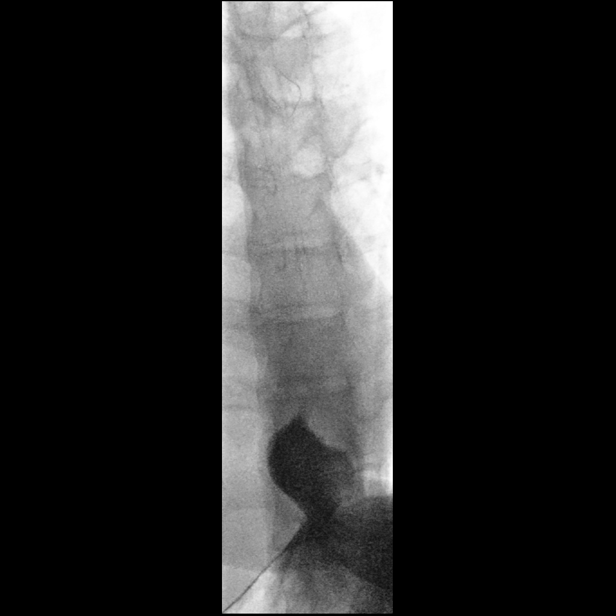

[Series 9: sequence · 1 of 70 frames shown (7 of 8)]
[frame 58/70]
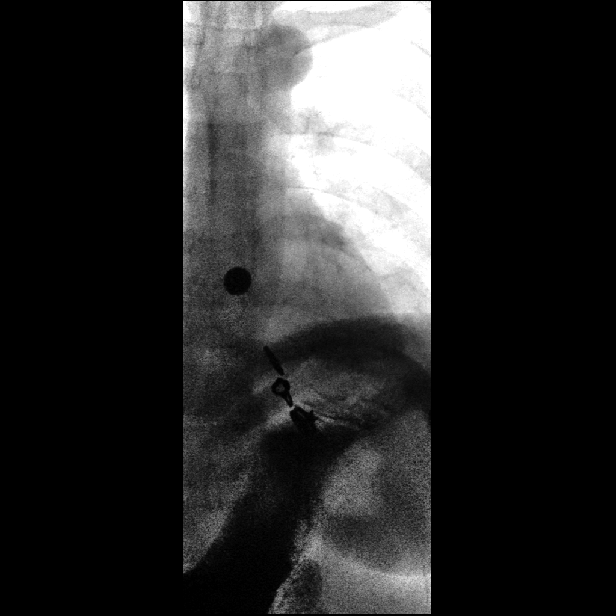

[Series 10: one shot · 1 of 1 slices shown (2 of 3)]
[im 1/1]
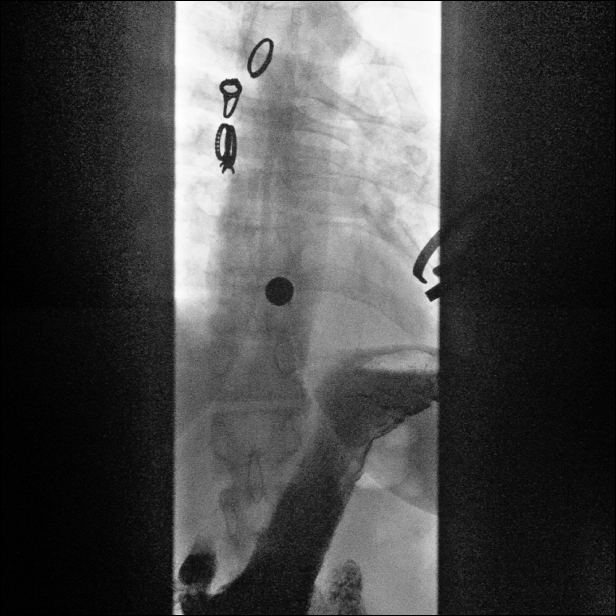

[Series 11: sequence · 1 of 44 frames shown (8 of 8)]
[frame 23/44]
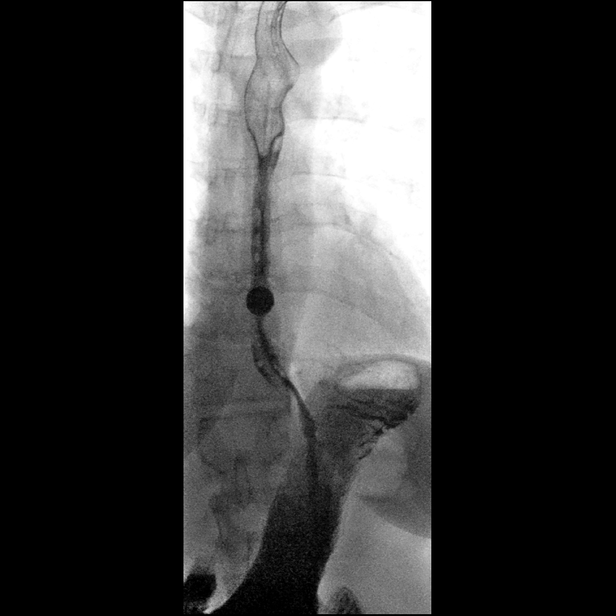

[Series 12: one shot · 1 of 2 slices shown (3 of 3)]
[im 2/2]
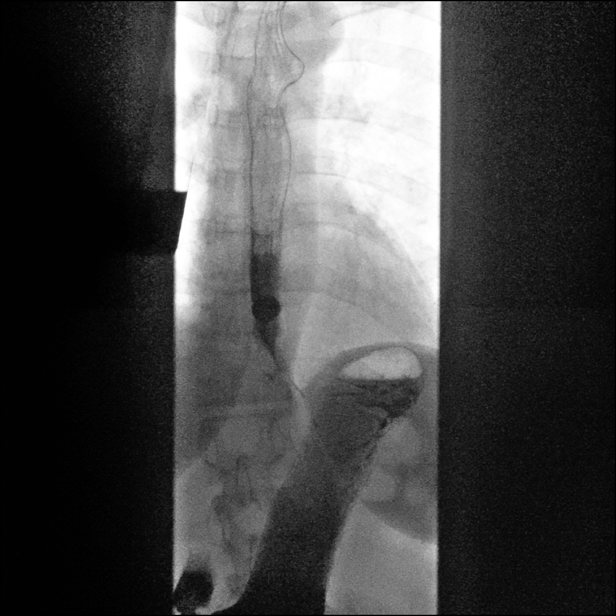

[14 of 24 positions shown; findings below may reference images not displayed]

FINDINGS: Normal oral and pharyngeal phase of swallowing, with no laryngeal
penetration or tracheobronchial aspiration. No significant barium
retention in the pharynx. No evidence of pharyngeal mass, stricture
or diverticulum. No significant cricopharyngeus muscle dysfunction.

Mild esophageal dysmotility, characterized by intermittent mild
weakening of primary peristalsis in the thoracic esophagus. Small
fixed hiatal hernia. Mild gastroesophageal reflux elicited to the
level of the lower thoracic esophagus with water siphon test. Mildly
granular lower thoracic esophageal mucosa suggesting mild reflux
esophagitis. Short segment of minimal circumferential narrowing in
the lower thoracic esophagus just above the esophagogastric
junction, at which location the barium tablet became lodged despite
multiple water and barium swallows. No evidence of esophageal mass
or ulcer.
IMPRESSION: 1. Small hiatal hernia. Mild gastroesophageal reflux elicited.
2. Short segment of minimal circumferential narrowing in the lower
thoracic esophagus just above the esophagogastric junction, at which
location the barium tablet became lodged, suggestive of a mild
peptic stricture. No evidence of esophageal mass or ulcer.
3. Mild esophageal dysmotility, characteristic of chronic reflux
related dysmotility.

## 2023-06-22 NOTE — Telephone Encounter (Signed)
D:  Pt sent the group facilitator [name], [place]) an email stating she has decided not to return to MH-IOP at this time d/t scheduling.  Reports she will be seeing a therapist on a weekly basis.  A:  Will reach out to patient in order to officially discharge her.  Inform treatment team and Dr. Mercy Riding.   R:  Pt receptive.

## 2023-06-22 NOTE — Patient Instructions (Signed)
D:  Pt requesting discharge today (06-22-23).  A:  Discharged patient.  Follow up with Dr. Mercy Riding on 06-28-23 @ 4:30 pm (virtual).  Also, patient will follow up with a therapist on a weekly basis.  Strongly recommend support groups through [facility] and [employer].

## 2023-06-22 NOTE — Progress Notes (Signed)
Virtual Visit via Telephone Note  I connected with Emily Park on @TODAY @ at  9:00 AM EDT by telephone and verified that I am speaking with the correct person using two identifiers.  Location: Patient: at home Provider: at home office   I discussed the limitations, risks, security and privacy concerns of performing an evaluation and management service by telephone and the availability of in person appointments. I also discussed with the patient that there may be a patient responsible charge related to this service. The patient expressed understanding and agreed to proceed. I discussed the assessment and treatment plan with the patient. The patient was provided an opportunity to ask questions and all were answered. The patient agreed with the plan and demonstrated an understanding of the instructions.   The patient was advised to call back or seek an in-person evaluation if the symptoms worsen or if the condition fails to improve as anticipated.  I provided 20 minutes of non-face-to-face time during this encounter.   Jeri Modena, M.Ed,CNA   Patient ID: Emily Park, female   DOB: February 01, 1971, 52 y.o.   MRN: [id number] D:   As per previous CCA states:  "Emily Park is a 52yo female referred to [facility] by her psychiatrist, Dr. Mercy Riding, for worsening depressive symptoms following the death of her father. She reports her father died by suicide via gunshot a week ago while [place] and that he is the first person she has ever lost. Other stressors include her complex medical history and being unable to work due to medical issues. She previously saw Dr. Milagros Evener for med man for 11 years and has been diagnosed with ADHD, anxiety, depression, and OCPD. She has since started seeing Dr. Mercy Riding within this office for med man. She endorses one suicide attempt earlier this year prior to her only psychiatric hospitalization. She denies NSSI, substance abuse, HI, AVH, and current SI. She  reports a great aunt and cousin died by suicide. She cites her two good friends, husband, sons, sisters, mother, and husband's family as her supports. She denies significant psychosocial changes since her first PHP assessment. She currently lives with her husband and her 25yo son lives with them part time. She reports complex medical history to include recent GI ulcers, IBS, anemia, and hx of rectal prolapse. She also reports she has recently been diagnosed with stage 3 kidney failure and has experienced nocturnal enuresis as a result. She states there are no firearms in her home."   Pt stepped down from virtual PHP to virtual MH-IOP today.  Pt was previously in MH-IOP (02-08-23 thru 03-06-23) d/t depression/anxiety. Pt states her husband will be starting chemo treatments soon at [facility].  Reports she really likes his new team of doctors.  Pt scored 9 on PHQ-9.  On a scale of 1-10 (10 being the worst), pt rates her depression and anxiety at a 4.  Denies SI/HI or A/V hallucinations.   Pt reports she is looking forward to meeting with a therapist the end of the month.  Patient only attended two days out of seven; most recent date attending group was 06-15-23.  Pt has been having to transport husband(recently dx'd with cancer) to [facility] [place] for doctor's appts and for treatments. Pt decided today that she wouldn't be returning to virtual MH-IOP d/t scheduling conflicts.  Reported she will be able to see an individual therapist on a weekly basis. A: D/C pt today per her request.  F/U with Dr.  Burgess on 06-28-23 @ 4:30 pm (virtual).  Will see her therapist on a weekly basis.   Pt was advised of ROI must be obtained prior to any records release in order to collaborate her care with an outside provider.  Pt was advised if she has not already done so to contact the front desk to sign all necessary forms in order for MH-IOP to release info re: her care.  Consent:  Pt gives verbal consent for tx and assignment of  benefits for services provided during this telehealth group process.  Pt expressed understanding and agreed to proceed. Collaboration of care:  Collaborate with Dr. Lamar Sprinkles AEB, Dr. Everlena Cooper, AEB; Hillery Jacks, NP AEB; [name], LCSW AEB. Strongly recommended support groups through [facility] [employer] and [employer].    R:  Pt receptive.   Jeri Modena, M.Ed,CNA

## 2023-06-22 NOTE — Progress Notes (Signed)
  [facility] Health Intensive Outpatient Program Discharge Summary  Emily Park [id number]  Admission date:06/08/2023 Discharge date: 06/15/2023  Reason for admission: Emily Park is 52 year old female that carries a history related to major depressive disorder, generalized anxiety disorder and self-reported attention deficit disorder.  Currently she is prescribed Zoloft 100 mg,  Minipress 2 mg nightly gabapentin 100 mg nightly, Trintellix 10 mg daily.    Per admission assessment note: " Emily Park started partial hospitalization programming after recent hospitalization due to suicidal attempt and worsening mood.  Emily Park stepdown to intensive outpatient programming on 06/15/2023.  However, it was reported that case manager received an email that patient decided to discontinue the program at this time.   Family of Origin Issues: Per case manager note "patient only attended two days out of seven; most recent date attending group was 06-15-23. Pt has been having to transport husband(recently dx'd with cancer) to [facility] [facility] [place] for doctor's appts and for treatments. "  Progress in Program Toward Treatment Goals: Progressing patient attended and participated with daily group session with active and engaged participation x 1 week.  Patient has requested to discharge from intensive outpatient programming at this time.  Per our last conversation patient was primary caregiver/main support for her husband who was diagnosed with cancer.  Patient spirits seem to be upbeat.  This provider attempted to reach out to patient regarding email.  As it was reported that patient declined to continue and intensive outpatient programming at this time.   Collaboration of Care: Psychiatrist AEB psychiatrist Robyne Askew was advised Release of Information must be obtained prior to any record release in order to collaborate their care with an outside provider. Patient/Guardian was advised if they  have not already done so to contact the registration department to sign all necessary forms in order for [name] to release information regarding their care.   Consent: Patient/Guardian gives verbal consent for treatment and assignment of benefits for services provided during this visit. Patient/Guardian expressed understanding and agreed to proceed.   Emily Rack, NP 06/22/2023

## 2023-06-23 ENCOUNTER — Other Ambulatory Visit (HOSPITAL_COMMUNITY): Payer: Self-pay | Admitting: Family

## 2023-06-25 ENCOUNTER — Other Ambulatory Visit (HOSPITAL_COMMUNITY): Payer: Commercial Managed Care - PPO

## 2023-06-26 ENCOUNTER — Other Ambulatory Visit (HOSPITAL_COMMUNITY): Payer: Commercial Managed Care - PPO

## 2023-06-27 ENCOUNTER — Ambulatory Visit (HOSPITAL_COMMUNITY): Payer: Commercial Managed Care - PPO

## 2023-06-27 NOTE — Psych (Signed)
Virtual Visit via Video Note  I connected with Emily Park on 06/01/23 at  9:00 AM EDT by a video enabled telemedicine application and verified that I am speaking with the correct person using two identifiers.  Location: Patient: patient home Provider: clinical home office   I discussed the limitations of evaluation and management by telemedicine and the availability of in person appointments. The patient expressed understanding and agreed to proceed.  I discussed the assessment and treatment plan with the patient. The patient was provided an opportunity to ask questions and all were answered. The patient agreed with the plan and demonstrated an understanding of the instructions.   The patient was advised to call back or seek an in-person evaluation if the symptoms worsen or if the condition fails to improve as anticipated.  Pt was provided 240 minutes of non-face-to-face time during this encounter.   Donia Guiles, LCSW   [facility] PHP THERAPIST PROGRESS NOTE  Emily Park [id number]  Session Time: 9:00 - 10:00  Participation Level: Active  Behavioral Response: CasualAlertDepressed  Type of Therapy: Group Therapy  Treatment Goals addressed: Coping  Progress Towards Goals: Initial  Interventions: CBT, DBT, Supportive, and Reframing  Summary: Emily Park is a 52 y.o. female who presents with depression and grief symptoms.  Clinician led check-in regarding current stressors and situation, and review of patient completed daily inventory. Clinician utilized active listening and empathetic response and validated patient emotions. Clinician facilitated processing group on pertinent issues.?    Therapist Response:  Patient arrived within time allowed. Patient rates her mood at a 5 on a scale of 1-10 with 10 being best. Pt states she feels "kind of low." Pt states she slept 4 hours and ate 1x. Pt reports she did laundry, showered, and sat on the porch with her dog. Pt states  struggling with rumination and scattered thought. Pt reports concern re: her son and how he is coping with the upheaval in the family. Patient able to process. Patient engaged in discussion.            Session Time: 10:00 am - 11:00 am   Participation Level: Active   Behavioral Response: CasualAlertDepressed   Type of Therapy: Group Therapy   Treatment Goals addressed: Coping   Progress Towards Goals: Progressing   Interventions: CBT, DBT, Solution Focused, Strength-based, Supportive, and Reframing   Therapist Response: Cln led discussion on ways to manage stressors and feelings over the weekend. Group members  brainstormed things to do over the weekend for multiple levels of energy, access, and moods. Cln reviewed crisis services should they be needed and provided pt's with the text crisis line, mobile crisis, national suicide hotline, [facility] 24/7 line, and information on [facility].      Therapist Response: Pt engaged in discussion and is able to identify 3 ideas of what to do over the weekend to keep their mind engaged.         Session Time: 11:00 -12:00   Participation Level: Active   Behavioral Response: CasualAlertDepressed   Type of Therapy: Group Therapy   Treatment Goals addressed: Coping   Progress Towards Goals: Progressing   Interventions: CBT, DBT, Solution Focused, Strength-based, Supportive, and Reframing   Summary: Cln continued topic of DBT distress tolerance skills and the ACCEPTS distraction skill. Group reviewed P-S skills and discussed how they can practice them in their every day life.    Therapist Response: Pt engaged in discussion and determines ways to practice each skill.  Session Time: 12:00 -1:00   Participation Level: Active   Behavioral Response: CasualAlertDepressed   Type of Therapy: Group therapy   Treatment Goals addressed: Coping   Progress Towards Goals: Progressing   Interventions: OT group   Summary: 12:00 -  12:50: Occupational Therapy group with cln E. Hollan.  12:50 - 1:00 Clinician assessed for immediate needs, medication compliance and efficacy, and safety concerns.   Therapist Response: 12:00 - 12:50: See note 12:50 - 1:00 pm: At check-out, patient reports no immediate concerns. Patient demonstrates progress as evidenced by continued engagement and responsiveness to treatment. Patient denies SI/HI/self-harm thoughts at the end of group.    Suicidal/Homicidal: Nowithout intent/plan  Plan: Pt will continue in PHP while working to decrease depression and grief symptoms, increase daily functioning, and increase ability to manage symptoms in a healthy manner.   Collaboration of Care: Medication Management AEB T Lewis  Patient/Guardian was advised Release of Information must be obtained prior to any record release in order to collaborate their care with an outside provider. Patient/Guardian was advised if they have not already done so to contact the registration department to sign all necessary forms in order for [name] to release information regarding their care.   Consent: Patient/Guardian gives verbal consent for treatment and assignment of benefits for services provided during this visit. Patient/Guardian expressed understanding and agreed to proceed.   Diagnosis: MDD (major depressive disorder), recurrent severe, without psychosis (HCC) [F33.2]    1. MDD (major depressive disorder), recurrent severe, without psychosis (HCC)   2. Generalized anxiety disorder      Donia Guiles, LCSW

## 2023-06-27 NOTE — Psych (Signed)
Virtual Visit via Video Note  I connected with Emily Park on 05/31/23 at  9:00 AM EDT by a video enabled telemedicine application and verified that I am speaking with the correct person using two identifiers.  Location: Patient: patient home Provider: clinical home office   I discussed the limitations of evaluation and management by telemedicine and the availability of in person appointments. The patient expressed understanding and agreed to proceed.  I discussed the assessment and treatment plan with the patient. The patient was provided an opportunity to ask questions and all were answered. The patient agreed with the plan and demonstrated an understanding of the instructions.   The patient was advised to call back or seek an in-person evaluation if the symptoms worsen or if the condition fails to improve as anticipated.  Pt was provided 240 minutes of non-face-to-face time during this encounter.   Donia Guiles, LCSW   [facility] PHP THERAPIST PROGRESS NOTE  AMESHIA Park [id number]  Session Time: 9:00 - 10:00  Participation Level: Active  Behavioral Response: CasualAlertDepressed  Type of Therapy: Group Therapy  Treatment Goals addressed: Coping  Progress Towards Goals: Initial  Interventions: CBT, DBT, Supportive, and Reframing  Summary: Emily Park is a 52 y.o. female who presents with depression and grief symptoms.  Clinician led check-in regarding current stressors and situation, and review of patient completed daily inventory. Clinician utilized active listening and empathetic response and validated patient emotions. Clinician facilitated processing group on pertinent issues.?    Therapist Response:  Patient arrived within time allowed. Patient rates her mood at a 4 on a scale of 1-10 with 10 being best. Pt states she feels "real anxious and depressed." Pt states she slept 4 broken hours and ate 1x. Pt reports she has been out of group due to illness,  appointments for her husband, and feeling ashamed about her missed sessions. Pt is crying during session. Pt states her husband has been diagnosed with esophageal cancer and they are seeking information on different treatments currently. Pt states she is crying, ruminating, spiraling, and isolating most of the time. Pt reports attempting to "give the grief to God" and it is difficult. Pt identifies passive SI and "wanting to be with my daddy" and denies plan and intent. Patient able to process. Patient engaged in discussion.            Session Time: 10:00 am - 11:00 am   Participation Level: Active   Behavioral Response: CasualAlertDepressed   Type of Therapy: Group Therapy   Treatment Goals addressed: Coping   Progress Towards Goals: Progressing   Interventions: CBT, DBT, Solution Focused, Strength-based, Supportive, and Reframing   Therapist Response: Cln led discussion on control and the way it impacts our lives. Group members shared struggles and worked to identify the way in which control is contributing to the struggle. Cln utilized CBT thought challenging and the Catch-Challenge-Change model to address their control issues.    Therapist Response:   Pt engaged in discussion and is able to determine ways in which control is an issue for them and brainstorm how to address it in a healthy manner.          Session Time: 11:00 -12:00   Participation Level: Active   Behavioral Response: CasualAlertDepressed   Type of Therapy: Group Therapy   Treatment Goals addressed: Coping   Progress Towards Goals: Progressing   Interventions: CBT, DBT, Solution Focused, Strength-based, Supportive, and Reframing   Summary: Cln continued topic of DBT  distress tolerance skills and the ACCEPTS distraction skill. Group reviewed E-T skills and discussed how they can practice them in their every day life.    Therapist Response:  Pt engaged in discussion and determines ways to practice each  skill.          Session Time: 12:00 -1:00   Participation Level: Active   Behavioral Response: CasualAlertDepressed   Type of Therapy: Group therapy   Treatment Goals addressed: Coping   Progress Towards Goals: Progressing   Interventions: OT group   Summary: 12:00 - 12:50: Occupational Therapy group with cln E. Hollan.  12:50 - 1:00 Clinician assessed for immediate needs, medication compliance and efficacy, and safety concerns.   Therapist Response: 12:00 - 12:50: See note 12:50 - 1:00 pm: At check-out, patient reports no immediate concerns. Patient demonstrates progress as evidenced by continued engagement and responsiveness to treatment. Patient denies SI/HI/self-harm thoughts at the end of group.    Suicidal/Homicidal: Nowithout intent/plan  Plan: Pt will continue in PHP while working to decrease depression and grief symptoms, increase daily functioning, and increase ability to manage symptoms in a healthy manner.   Collaboration of Care: Medication Management AEB T Lewis  Patient/Guardian was advised Release of Information must be obtained prior to any record release in order to collaborate their care with an outside provider. Patient/Guardian was advised if they have not already done so to contact the registration department to sign all necessary forms in order for [name] to release information regarding their care.   Consent: Patient/Guardian gives verbal consent for treatment and assignment of benefits for services provided during this visit. Patient/Guardian expressed understanding and agreed to proceed.   Diagnosis: MDD (major depressive disorder), recurrent severe, without psychosis (HCC) [F33.2]    1. MDD (major depressive disorder), recurrent severe, without psychosis (HCC)   2. Generalized anxiety disorder      Donia Guiles, LCSW

## 2023-06-27 NOTE — Progress Notes (Signed)
Pt on schedule in error.

## 2023-06-28 ENCOUNTER — Encounter (HOSPITAL_COMMUNITY): Payer: Self-pay | Admitting: Psychiatry

## 2023-06-28 ENCOUNTER — Ambulatory Visit (HOSPITAL_COMMUNITY): Payer: Commercial Managed Care - PPO

## 2023-06-28 ENCOUNTER — Telehealth (HOSPITAL_BASED_OUTPATIENT_CLINIC_OR_DEPARTMENT_OTHER): Payer: Commercial Managed Care - PPO | Admitting: Psychiatry

## 2023-06-28 DIAGNOSIS — F411 Generalized anxiety disorder: Secondary | ICD-10-CM

## 2023-06-28 DIAGNOSIS — Z79899 Other long term (current) drug therapy: Secondary | ICD-10-CM | POA: Diagnosis not present

## 2023-06-28 DIAGNOSIS — F332 Major depressive disorder, recurrent severe without psychotic features: Secondary | ICD-10-CM

## 2023-06-28 MED ORDER — GABAPENTIN 300 MG PO CAPS
300.0000 mg | ORAL_CAPSULE | Freq: Every day | ORAL | 1 refills | Status: DC
Start: 2023-06-28 — End: 2023-08-02

## 2023-06-28 MED ORDER — PRAZOSIN HCL 1 MG PO CAPS: 1.0000 mg | ORAL_CAPSULE | Freq: Every day | ORAL | 0 refills | Status: DC

## 2023-06-28 MED ORDER — CLONAZEPAM 1 MG PO TBDP: 1.0000 mg | ORAL_TABLET | Freq: Every day | ORAL | 1 refills | Status: DC

## 2023-06-28 MED ORDER — SERTRALINE HCL 100 MG PO TABS: 150.0000 mg | ORAL_TABLET | Freq: Every day | ORAL | 2 refills | Status: DC

## 2023-06-28 NOTE — Psych (Signed)
Virtual Visit via Video Note  I connected with Emily Park on 06/07/23 at  9:00 AM EDT by a video enabled telemedicine application and verified that I am speaking with the correct person using two identifiers.  Location: Patient: patient home Provider: clinical home office   I discussed the limitations of evaluation and management by telemedicine and the availability of in person appointments. The patient expressed understanding and agreed to proceed.  I discussed the assessment and treatment plan with the patient. The patient was provided an opportunity to ask questions and all were answered. The patient agreed with the plan and demonstrated an understanding of the instructions.   The patient was advised to call back or seek an in-person evaluation if the symptoms worsen or if the condition fails to improve as anticipated.  Pt was provided 240 minutes of non-face-to-face time during this encounter.   Donia Guiles, LCSW   [facility] PHP THERAPIST PROGRESS NOTE  Emily Park [id number]  Session Time: 9:00 - 10:00  Participation Level: Active  Behavioral Response: CasualAlertDepressed  Type of Therapy: Group Therapy  Treatment Goals addressed: Coping  Progress Towards Goals: Initial  Interventions: CBT, DBT, Supportive, and Reframing  Summary: Emily Park is a 52 y.o. female who presents with depression and grief symptoms.  Clinician led check-in regarding current stressors and situation, and review of patient completed daily inventory. Clinician utilized active listening and empathetic response and validated patient emotions. Clinician facilitated processing group on pertinent issues.?    Therapist Response:  Patient arrived within time allowed. Patient rates her mood at a 5 on a scale of 1-10 with 10 being best. Pt states she feels "just blah." Pt states she slept 6.5 hours and ate 2x. Pt reports she is sleeping better on her new medication and is felt hungry  yesterday, which is an improvement. Pt shares that she has an appointment today as a follow-up for an abnormal test and she is nervous because "I don't think I can handle any other problems." Pt reports she had a dream about her dad that flared her grief and he is even more on her mind today. Patient able to process. Patient engaged in discussion.            Session Time: 10:00 am - 11:00 am   Participation Level: Active   Behavioral Response: CasualAlertDepressed   Type of Therapy: Group Therapy   Treatment Goals addressed: Coping   Progress Towards Goals: Progressing   Interventions: CBT, DBT, Solution Focused, Strength-based, Supportive, and Reframing   Therapist Response: Cln led discussion on accountability and the balance between taking responsibility and not beating ourselves up. Group members shared current consequences they are dealing with and how they are processing them. Cln encouraged pt's to utilize the DBT dialectics when processing.   Therapist Response: Pt engaged in discussion and is able to process.       Session Time: 11:00 -12:00   Participation Level: Active   Behavioral Response: CasualAlertDepressed   Type of Therapy: Group Therapy   Treatment Goals addressed: Coping   Progress Towards Goals: Progressing   Interventions: CBT, DBT, Solution Focused, Strength-based, Supportive, and Reframing   Summary: Cln led discussion on personal standards and they way in which it impacts the way we view ourselves and our abilities. Group members discussed judgment, struggles, and barriers they experience in terms of personal standards. Cln brought in topics of balance, grace, and kindness. Cln proposed the "best friend test" as a way to  calibrate whether we are viewing our situation with kindness or harshness.    Therapist Response: Pt engaged in discussion and reports willingness to utilize the best friend test.          Session Time: 12:00 -1:00    Participation Level: Active   Behavioral Response: CasualAlertDepressed   Type of Therapy: Group therapy   Treatment Goals addressed: Coping   Progress Towards Goals: Progressing   Interventions: CBT, DBT, Solution Focused, Strength-based, Supportive, and Reframing   Summary: 12:00 - 12:50: Cln led discussion on unknowns and the way they impact our anxiety. Cln discussed cognitive restructuring, DBT distraction skills, and positive mantras/prayer as ways to address the anxiety. Cln discussed idea "I can do hard things" and ways to bolster confidence in our ability to handle difficult situations.  12:50 - 1:00 Clinician assessed for immediate needs, medication compliance and efficacy, and safety concerns.   Therapist Response: 12:00 - 12:50: Pt shared current unknowns they are dealing with and is able to identify ways to manage.  12:50 - 1:00 pm: At check-out, patient reports no immediate concerns. Patient demonstrates progress as evidenced by continued engagement and responsiveness to treatment. Patient denies SI/HI/self-harm thoughts at the end of group.    Suicidal/Homicidal: Nowithout intent/plan  Plan: Pt will continue in PHP while working to decrease depression and grief symptoms, increase daily functioning, and increase ability to manage symptoms in a healthy manner.   Collaboration of Care: Medication Management AEB T Lewis  Patient/Guardian was advised Release of Information must be obtained prior to any record release in order to collaborate their care with an outside provider. Patient/Guardian was advised if they have not already done so to contact the registration department to sign all necessary forms in order for [name] to release information regarding their care.   Consent: Patient/Guardian gives verbal consent for treatment and assignment of benefits for services provided during this visit. Patient/Guardian expressed understanding and agreed to proceed.   Diagnosis: MDD  (major depressive disorder), recurrent severe, without psychosis (HCC) [F33.2]    1. MDD (major depressive disorder), recurrent severe, without psychosis (HCC)   2. Generalized anxiety disorder   3. Bereavement, uncomplicated      Donia Guiles, LCSW

## 2023-06-28 NOTE — Progress Notes (Signed)
BH MD/PA/NP OP Progress Note  06/28/2023 5:02 PM Emily Park  MRN:  [id number]  Visit Diagnosis:    ICD-10-CM   1. Long-term current use of benzodiazepine  Z79.899 clonazePAM (KLONOPIN) 1 MG disintegrating tablet    2. Generalized anxiety disorder  F41.1 gabapentin (NEURONTIN) 300 MG capsule    clonazePAM (KLONOPIN) 1 MG disintegrating tablet    prazosin (MINIPRESS) 1 MG capsule    sertraline (ZOLOFT) 100 MG tablet    3. 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 female with a history of MDD with suicide attempt and subsequent hospitalization in May 2024, GAD, reported ADHD, and IBS who presented to [facility] for initial evaluation on 03/27/2023.  At initial evaluation patient reported struggling with neurovegetative symptoms of depression including low mood, anhedonia, amotivation, disturbed sleep, hopelessness, feelings of worthlessness, and negative self thoughts.  She denied any SI/HI or thoughts of self-harm though had acted on suicidal thoughts in May 2024.  Crisis resources were reviewed and patient has completed safety planning.  Medications are managed by her husband and she is able to list a number of supports she can reach out to if needed.  Patient also struggled with symptoms of anxiety including excessive worry, fear of something awful happening, restlessness, palpitations, chest pressure, and shortness of breath.  At times the anxiety can progress to panic episodes.  Patient does have a past trauma history from which she experiences nightmares and hypervigilance though she denies any other symptoms consistent with PTSD.  Also of note she has been prescribed high-dose benzodiazepines for over a decade and is in the process of tapering off of them.  Psychosocially patient has good supports in husband, family, and friends.  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  There is increased financial stressors at time of initial evaluation.  Patient met criteria for MDD and GAD.  She did have a former diagnosis of ADHD this could be complicated by her other underlying psychiatric disorders and further neurological testing would be appropriate.  Emily Park presents for follow-up evaluation. Today, 06/28/23, patient reports improvement in her depression, ruminations, and negative self thoughts.  While still not back to her baseline she is able to function and is not feeling overwhelmed that she had in the past.  Patient did complete the PHP program and part of the IOP program in the interim.  She does still have the stressors of her husband's ongoing cancer treatment though does not find it to be as paralyzing as the past.  While at PHP/IOP she was started on gabapentin and prazosin with clonidine and mirtazapine being discontinued.  We will continue on this regimen at this time.  As for the Klonopin and we agreed to continue for now taper off the medication in the future.  Patient is scheduled to connect with a grief counselor later today.  We will follow up in a month.  Plan:  - Continue Zoloft 150 mg daily - Stopped Mirtazapine 7.5 mg QHS - Continue Klonopin 1 mg at bedtime - Start Gabapentin 300 mg at bedtime - Start Prazosin 1 mg QHS - Discontinue Clonidine 0.1 mg at bedtime - Continue Topamax 100 mg in the morning for migraines - Start grief counseling later today - Completed PHP  and part of IOP - Provided info about group therapy options like [facility] and the [facility] - Neuropsysch testing referral - Crisis resources reviewed - Follow up in a month  Chief Complaint:  Chief Complaint  Patient presents with   Follow-up   HPI:  Emily Park presents reporting that she is doing much better than her last appointment.  In the interim she completed a PHP and moved  on to the IOP program.  She did have to stop the IOP early as she was missing a number of sessions to attend her husband's cancer treatment.  Despite this Emily Park felt like the program was helpful and that her anxiety and depression are better managed.  She still has a number of stressors but feels like she is able to hold on and manage them much better than the past.  At this point she feels like individual therapy and grief counseling will be the most beneficial for her going forward.  She has an appointment with a grief counselor later today.  While at the program some medication changes were made including the discontinuation of mirtazapine and clonidine.  Patient was then started on gabapentin 300 mg at bedtime and prazosin 1 mg at bedtime.  She denies adverse side effects from these and feels the medication regimen has been working well.  She has remained on Klonopin 1 mg at bedtime which we agreed to continue today with the long-term goal of tapering off the medication.  Past Psychiatric History: Patient was admitted to [facility] in 12/2022.  After which she stepped down to PHP on 01/16/2023 followed by IOP on 02/08/2023.  Patient had been admitted to the hospital following a suicide attempt by insulin overdose.  Prior to this she had no other suicide attempts or episodes of self-harm. OPT: Dr. Evelene Croon 10+ years Therapist: [facility] 2x, but no insurance coverage, appointment scheduled with [facility] Previous: Klonopin 1mg  QHS and doxepin 75mg  QHS, patient has been on Klonopin 10 years, they were rx Klonopin 1mg , TID but she gets sedated so was only taking BID/QHS until around 09/2022, Abilify, Prozac (worked then pooped out after 20+ years), Trintellix, doxepin (poor benefit), ( Xanax, Wellbutrin ( failed), BuSar (ineffective), Atarax (ineffective), Ambien ( behavior changes, spending money suddenly). Trazodone did not help with sleep.   Patient reports that she has abused Klonopin in the past and  took some of her husband's Xanax in February 2024.  Patient is being tapered off of Klonopin now.  She denies any current ethanol use though had in the past.  Patient discontinued due to developing stomach ulcers.  She denies any THC, tobacco, or other substance use.  Past Medical History:  Past Medical History:  Diagnosis Date   Abnormal TSH    ADD (attention deficit disorder)    Anemia    Anxiety    Arrhythmia    Decreased libido    Depression    Heart murmur    Hyperlipidemia    Hypokalemia    Migraine    Nephrolithiasis    Pap smear abnormality of cervix/human papillomavirus (HPV) positive    Sleep difficulties    SVT (supraventricular tachycardia) (HCC)     Past Surgical History:  Procedure Laterality Date   ABLATION     Cardiac ablation, endometrial ablation   BREAST LUMPECTOMY     right breast   BREAST LUMPECTOMY Right    CARDIOVERSION     COLONOSCOPY WITH PROPOFOL N/A 05/16/2021   Procedure: COLONOSCOPY WITH PROPOFOL;  Surgeon:  Charlott Rakes, MD;  Location: [facility];  Service: Endoscopy;  Laterality: N/A;   COLPOSCOPY     w/ cervical biopsy   DILITATION & CURRETTAGE/HYSTROSCOPY WITH NOVASURE ABLATION N/A 09/11/2014   Procedure: DILATATION & CURETTAGE/HYSTEROSCOPY WITH NOVASURE ABLATION;  Surgeon: Lenoard Aden, MD;  Location: [facility];  Service: Gynecology;  Laterality: N/A;   GIVENS CAPSULE STUDY N/A 05/16/2021   Procedure: GIVENS CAPSULE STUDY;  Surgeon: Charlott Rakes, MD;  Location: [facility];  Service: Endoscopy;  Laterality: N/A;   XI ROBOT ASSISTED RECTOPEXY N/A 07/07/2021   Procedure: XI ROBOT ASSISTED RECTOPEXY;  Surgeon: Karie Soda, MD;  Location: [facility];  Service: General;  Laterality: N/A;   XI ROBOTIC ASSISTED LOWER ANTERIOR RESECTION N/A 07/07/2021   Procedure: XI ROBOTIC ASSISTED LOW ANTERIOR RECTOSIGMOID RESECTION WITH RIGID PROCTOSCOPY, TAP BLOCK;  Surgeon: Karie Soda, MD;  Location: [facility];  Service: General;  Laterality: N/A;    Family History:  Family History  Problem Relation Age of Onset   Hypothyroidism Mother    Depression Mother    Cancer Mother        thyroid   Thyroid disease Mother    Alcohol abuse Father    Hypertension Father    Heart attack Father        Age 77   AAA (abdominal aortic aneurysm) Father    Depression Sister    Hypothyroidism Sister    Hyperthyroidism Sister    Diabetes Maternal Grandfather    Depression Maternal Grandmother    Breast cancer Paternal Grandmother    Depression Cousin     Social History:  Social History   Socioeconomic History   Marital status: Married    Spouse name: Not on file   Number of children: 2   Years of education: college   Highest education level: Associate degree: academic program  Occupational History   Occupation: Charity fundraiser with Engineer, manufacturing systems: [employer]   Tobacco Use   Smoking status: Never    Passive exposure: Current   Smokeless tobacco: Never  Vaping Use   Vaping status: Never Used  Substance and Sexual Activity   Alcohol use: Not Currently    Alcohol/week: 2.0 standard drinks of alcohol    Types: 2 Glasses of wine per week   Drug use: Not Currently   Sexual activity: Yes    Partners: Male    Comment: Married  Other Topics Concern   Not on file  Social History Narrative   Lives at home with two sons.   Right-handed.   60 ounces of tea and soda per day.   Social Determinants of Health   Financial Resource Strain: Low Risk  (10/16/2018)   Received from [facility] visits prior to 10/28/2022., [facility] visits prior to 10/28/2022.   Overall Financial Resource Strain (CARDIA)    Difficulty of Paying Living Expenses: Not hard at all  Food Insecurity: Food Insecurity Present (12/28/2022)   Hunger Vital Sign    Worried About Running Out of Food in the Last Year: Sometimes true    Ran Out of Food in the Last Year: Sometimes true  Transportation Needs: No Transportation Needs (12/28/2022)    PRAPARE - Administrator, Civil Service (Medical): No    Lack of Transportation (Non-Medical): No  Physical Activity: Not on file  Stress: Not on file  Social Connections: Unknown (01/12/2022)   Received from [facility]   Social Network  Social Network: Not on file    Allergies:  Allergies  Allergen Reactions   Zithromax [Azithromycin] Rash    Current Medications: Current Outpatient Medications  Medication Sig Dispense Refill   ACCRUFER 30 MG CAPS Take 1 capsule (30 mg total) by mouth 2 (two) times daily. 90 capsule 0   Cholecalciferol (VITAMIN D3) 50 MCG (2000 UT) capsule Take 2,000 Units by mouth daily.     clonazePAM (KLONOPIN) 1 MG disintegrating tablet Take 1 tablet (1 mg total) by mouth at bedtime. 30 tablet 1   gabapentin (NEURONTIN) 300 MG capsule Take 1 capsule (300 mg total) by mouth at bedtime. 30 capsule 1   hydrocortisone (ANUSOL-HC) 2.5 % rectal cream Apply 1 Application topically 4 (four) times daily as needed for hemorrhoids. 30 g 0   linaclotide (LINZESS) 145 MCG CAPS capsule Take 1 capsule (145 mcg total) by mouth daily before breakfast. 30 capsule 0   loratadine (CLARITIN) 10 MG tablet Take 1 tablet (10 mg total) by mouth daily as needed for allergies. (Patient not taking: Reported on 01/19/2023) 30 tablet 0   Multiple Vitamin (MULTIVITAMIN WITH MINERALS) TABS tablet Take 1 tablet by mouth every morning.     pantoprazole (PROTONIX) 40 MG tablet Take 1 tablet (40 mg total) by mouth 2 (two) times daily. 180 tablet 3   polyethylene glycol (MIRALAX / GLYCOLAX) 17 g packet Take 17 g by mouth 2 (two) times daily. (Patient taking differently: Take 17 g by mouth daily as needed for moderate constipation.) 14 each 0   polyvinyl alcohol (LIQUIFILM TEARS) 1.4 % ophthalmic solution Place 1 drop into both eyes as needed for dry eyes. 15 mL 0   potassium chloride SA (KLOR-CON M20) 20 MEQ tablet Take 1 tablet (20 mEq total) by mouth 2 (two) times daily.  180 tablet 1   prazosin (MINIPRESS) 1 MG capsule Take 1 capsule (1 mg total) by mouth at bedtime. 30 capsule 0   rizatriptan (MAXALT) 10 MG tablet PLEASE SEE ATTACHED FOR DETAILED DIRECTIONS     sertraline (ZOLOFT) 100 MG tablet Take 1.5 tablets (150 mg total) by mouth daily. 45 tablet 2   Sodium Fluoride (PREVIDENT 5000 BOOSTER PLUS) 1.1 % PSTE As directed tid 100 mL 3   topiramate (TOPAMAX) 100 MG tablet Take 1 tablet (100 mg total) by mouth daily. 90 tablet 1   No current facility-administered medications for this visit.     Psychiatric Specialty Exam: Review of Systems  There were no vitals taken for this visit.There is no height or weight on file to calculate BMI.  General Appearance: Disheveled  Eye Contact:  Fair  Speech:  Clear and Coherent  Volume:  Normal  Mood:  Depressed and Euthymic  Affect:  Congruent  Thought Process:  Coherent  Orientation:  Full (Time, Place, and Person)  Thought Content: Logical, Illogical, and Rumination   Suicidal Thoughts:  No  Homicidal Thoughts:  No  Memory:  Immediate;   Fair  Judgement:  Fair  Insight:  Fair  Psychomotor Activity:  Decreased and Restlessness  Concentration:  Concentration: Fair  Recall:  [employer] of Knowledge: Fair  Language: Good  Akathisia:  No    AIMS (if indicated): not done  Assets:  Communication Skills Desire for Improvement Financial Resources/Insurance Housing Social Support  ADL's:  Intact  Cognition: WNL  Sleep:  Fair   Metabolic Disorder Labs: Lab Results  Component Value Date   HGBA1C 4.9 12/20/2022   MPG 93.93 12/20/2022   MPG 93.93  07/06/2021   No results found for: "PROLACTIN" Lab Results  Component Value Date   CHOL 208 (H) 02/24/2014   TRIG 81.0 02/24/2014   HDL 54.90 02/24/2014   CHOLHDL 4 02/24/2014   VLDL 16.2 02/24/2014   LDLCALC 137 (H) 02/24/2014   Lab Results  Component Value Date   TSH 0.18 (L) 03/20/2023   TSH 0.175 (L) 12/21/2022    Therapeutic Level Labs: No  results found for: "LITHIUM" No results found for: "VALPROATE" No results found for: "CBMZ"   Screenings: AIMS    Flowsheet Row [facility]  AIMS Total Score 0      AUDIT    Flowsheet Row Ad[facility]  Alcohol Use Disorder Identification Test Final Score (AUDIT) 1      GAD-7    Flowsheet Row Office Visit from 03/20/2023 in [facility] [place] Counselor from 01/09/2023 in BEHAVIORAL HEALTH PARTIAL HOSPITALIZATION PROGRAM Office Visit from 11/22/2022 in [facility]  Total GAD-7 Score 7 15 12       PHQ2-9    Flowsheet Row Counselor from 06/14/2023 in BEHAVIORAL HEALTH INTENSIVE PSYCH Counselor from 06/08/2023 in BEHAV[facility]ALTH PARTIAL HOSPITALIZATION PROGRAM Counselor from 06/01/2023 in BE[facility]IAL HOSPITALIZATION PROGRAM Counselor from 05/03/2023 in BEHAVIORAL HEALTH PARTIAL HOSPITALIZATION PROGRAM Office Visit from 03/20/2023 in [facility] [place]  PHQ-2 Total Score 2 2 2 4 4   PHQ-9 Total Score 9 6 17 14 14       Flowsheet Row Counselor from 06/14/2023 in BEHAVIORAL HEALTH INTENSIVE PSYCH Counselor from 06/01/2023 in BEHAVIORAL HEALTH PARTIAL HOSPITALIZATION PROGRAM Counselor from 05/03/2023 in BEHAVIORAL HEALTH PARTIAL HOSPITALIZATION PROGRAM  C-SSRS RISK CATEGORY Error: Question 6 not populated No Risk Error: Question 2 not populated       Collaboration of Care: Collaboration of Care: Medication Management AEB medication prescription, Other provider involved in patient's care AEB internal med chart review, and Referral or follow-up with counselor/therapist AEB IOP referral  Patient/Guardian was advised Release of Information must be obtained prior to any record release in order to collaborate their care with an outside provider.  Patient/Guardian was advised if they have not already done so to contact the registration department to sign all necessary forms in order for [name] to release information regarding their care.   Consent: Patient/Guardian gives verbal consent for treatment and assignment of benefits for services provided during this visit. Patient/Guardian expressed understanding and agreed to proceed.    Stasia Cavalier, MD 06/28/2023, 5:02 PM   Virtual Visit via Video Note  I connected with Emily Park on 06/28/23 at  4:30 PM EDT by a video enabled telemedicine application and verified that I am speaking with the correct person using two identifiers.  Location: Patient: Home Provider: Home Office   I discussed the limitations of evaluation and management by telemedicine and the availability of in person appointments. The patient expressed understanding and agreed to proceed.   I discussed the assessment and treatment plan with the patient. The patient was provided an opportunity to ask questions and all were answered. The patient agreed with the plan and demonstrated an understanding of the instructions.   The patient was advised to call back or seek an in-person evaluation if the symptoms worsen or if the condition fails to improve as anticipated.  I provided 20 minutes of non-face-to-face time during this encounter.  Stasia Cavalier, MD

## 2023-06-28 NOTE — Psych (Signed)
Virtual Visit via Video Note  I connected with Emily Park on 06/08/23 at  9:00 AM EDT by a video enabled telemedicine application and verified that I am speaking with the correct person using two identifiers.  Location: Patient: patient home Provider: clinical home office   I discussed the limitations of evaluation and management by telemedicine and the availability of in person appointments. The patient expressed understanding and agreed to proceed.  I discussed the assessment and treatment plan with the patient. The patient was provided an opportunity to ask questions and all were answered. The patient agreed with the plan and demonstrated an understanding of the instructions.   The patient was advised to call back or seek an in-person evaluation if the symptoms worsen or if the condition fails to improve as anticipated.  Pt was provided 240 minutes of non-face-to-face time during this encounter.   Donia Guiles, LCSW   [facility] PHP THERAPIST PROGRESS NOTE  Emily Park [id number]  Session Time: 9:00 - 10:00  Participation Level: Active  Behavioral Response: CasualAlertDepressed  Type of Therapy: Group Therapy  Treatment Goals addressed: Coping  Progress Towards Goals: Progressing  Interventions: CBT, DBT, Supportive, and Reframing  Summary: Emily Park is a 51 y.o. female who presents with depression and grief symptoms.  Clinician led check-in regarding current stressors and situation, and review of patient completed daily inventory. Clinician utilized active listening and empathetic response and validated patient emotions. Clinician facilitated processing group on pertinent issues.?    Therapist Response:  Patient arrived within time allowed. Patient rates her mood at a 6 on a scale of 1-10 with 10 being best. Pt states she feels "okay." Pt states she slept 7.5 hours and ate 1x. Pt reports her doctor started her on new hormone medication and pt is hopeful it  will help. Pt states she tried to stay occupied after group and watched tv and sat outside. Patient able to process. Patient engaged in discussion.            Session Time: 10:00 am - 11:00 am   Participation Level: Active   Behavioral Response: CasualAlertDepressed   Type of Therapy: Group Therapy   Treatment Goals addressed: Coping   Progress Towards Goals: Progressing   Interventions: CBT, DBT, Solution Focused, Strength-based, Supportive, and Reframing   Therapist Response: Cln introduced DBT emotion regulation skill, PLEASE. Cln discussed the importance of taking care of our whole body as a way to aid in stabilizing emotions. Group discussed ways they struggle to manage the elements of PLEASE and how to remove barriers.    Therapist Response:  Pt engaged in discussion and is able to identify ways to utilize PLEASE skill.           Session Time: 11:00 -12:00   Participation Level: Active   Behavioral Response: CasualAlertDepressed   Type of Therapy: Group Therapy   Treatment Goals addressed: Coping   Progress Towards Goals: Progressing   Interventions: CBT, DBT, Solution Focused, Strength-based, Supportive, and Reframing   Summary: Cln led discussion on ways to manage stressors and feelings over the weekend. Group members  brainstormed things to do over the weekend for multiple levels of energy, access, and moods. Cln reviewed crisis services should they be needed and provided pt's with the text crisis line, mobile crisis, national suicide hotline, [facility] 24/7 line, and information on [facility].      Therapist Response: Pt engaged in discussion and is able to identify 3 ideas of what  to do over the weekend to keep their mind engaged.          Session Time: 12:00 -1:00   Participation Level: Active   Behavioral Response: CasualAlertDepressed   Type of Therapy: Group therapy   Treatment Goals addressed: Coping   Progress Towards Goals: Progressing    Interventions: OT group   Summary: 12:00 - 12:50: Occupational Therapy group with cln E. Hollan.  12:50 - 1:00 Clinician assessed for immediate needs, medication compliance and efficacy, and safety concerns.   Therapist Response: 12:00 - 12:50: See note 12:50 - 1:00 pm: At check-out, patient reports no immediate concerns. Patient demonstrates progress as evidenced by continued engagement and responsiveness to treatment. Patient denies SI/HI/self-harm thoughts at the end of group.    Suicidal/Homicidal: Nowithout intent/plan  Plan: Pt will discharge from PHP due to increased stability and ability to maintain with frequent absences. Pt will step down to IOP within this agency beginning 10/17. Pt and provider are aligned with discharge plan. Pt denies SI/HI at time of discharge.   Collaboration of Care: Medication Management AEB T Lewis  Patient/Guardian was advised Release of Information must be obtained prior to any record release in order to collaborate their care with an outside provider. Patient/Guardian was advised if they have not already done so to contact the registration department to sign all necessary forms in order for [name] to release information regarding their care.   Consent: Patient/Guardian gives verbal consent for treatment and assignment of benefits for services provided during this visit. Patient/Guardian expressed understanding and agreed to proceed.   Diagnosis: MDD (major depressive disorder), recurrent severe, without psychosis (HCC) [F33.2]    1. MDD (major depressive disorder), recurrent severe, without psychosis (HCC)   2. Insomnia, unspecified type   3. Bereavement, uncomplicated      Donia Guiles, LCSW

## 2023-06-29 ENCOUNTER — Ambulatory Visit (HOSPITAL_COMMUNITY): Payer: Commercial Managed Care - PPO

## 2023-07-02 ENCOUNTER — Ambulatory Visit (HOSPITAL_COMMUNITY): Payer: Commercial Managed Care - PPO

## 2023-07-03 ENCOUNTER — Ambulatory Visit (HOSPITAL_COMMUNITY): Payer: Commercial Managed Care - PPO | Admitting: Psychiatry

## 2023-07-04 ENCOUNTER — Other Ambulatory Visit (HOSPITAL_COMMUNITY): Payer: Self-pay | Admitting: Psychiatry

## 2023-07-04 ENCOUNTER — Ambulatory Visit (HOSPITAL_COMMUNITY): Payer: Commercial Managed Care - PPO

## 2023-07-04 ENCOUNTER — Ambulatory Visit: Payer: Commercial Managed Care - PPO | Admitting: Internal Medicine

## 2023-07-04 DIAGNOSIS — F411 Generalized anxiety disorder: Secondary | ICD-10-CM

## 2023-07-04 DIAGNOSIS — F332 Major depressive disorder, recurrent severe without psychotic features: Secondary | ICD-10-CM

## 2023-07-05 ENCOUNTER — Other Ambulatory Visit (HOSPITAL_COMMUNITY): Payer: Commercial Managed Care - PPO

## 2023-07-06 ENCOUNTER — Other Ambulatory Visit (HOSPITAL_COMMUNITY): Payer: Commercial Managed Care - PPO

## 2023-07-09 ENCOUNTER — Ambulatory Visit (HOSPITAL_COMMUNITY): Payer: Commercial Managed Care - PPO

## 2023-07-10 ENCOUNTER — Ambulatory Visit (HOSPITAL_COMMUNITY): Payer: Commercial Managed Care - PPO

## 2023-07-11 ENCOUNTER — Ambulatory Visit (HOSPITAL_COMMUNITY): Payer: Commercial Managed Care - PPO

## 2023-07-12 ENCOUNTER — Ambulatory Visit (HOSPITAL_COMMUNITY): Payer: Commercial Managed Care - PPO

## 2023-07-13 ENCOUNTER — Ambulatory Visit (HOSPITAL_COMMUNITY): Payer: Commercial Managed Care - PPO

## 2023-07-15 ENCOUNTER — Other Ambulatory Visit (HOSPITAL_COMMUNITY): Payer: Self-pay | Admitting: Psychiatry

## 2023-07-15 DIAGNOSIS — F411 Generalized anxiety disorder: Secondary | ICD-10-CM

## 2023-07-15 DIAGNOSIS — F332 Major depressive disorder, recurrent severe without psychotic features: Secondary | ICD-10-CM

## 2023-07-16 ENCOUNTER — Other Ambulatory Visit (HOSPITAL_COMMUNITY): Payer: Commercial Managed Care - PPO

## 2023-07-17 ENCOUNTER — Ambulatory Visit (HOSPITAL_COMMUNITY): Payer: Commercial Managed Care - PPO

## 2023-07-18 ENCOUNTER — Other Ambulatory Visit (HOSPITAL_COMMUNITY): Payer: Commercial Managed Care - PPO

## 2023-07-19 ENCOUNTER — Telehealth: Payer: Self-pay | Admitting: Internal Medicine

## 2023-07-19 ENCOUNTER — Ambulatory Visit (INDEPENDENT_AMBULATORY_CARE_PROVIDER_SITE_OTHER): Payer: Commercial Managed Care - PPO | Admitting: Internal Medicine

## 2023-07-19 ENCOUNTER — Other Ambulatory Visit (HOSPITAL_COMMUNITY): Payer: Commercial Managed Care - PPO

## 2023-07-19 ENCOUNTER — Encounter: Payer: Self-pay | Admitting: Internal Medicine

## 2023-07-19 VITALS — BP 94/60 | HR 81 | Temp 98.7°F | Ht 64.0 in | Wt 118.6 lb

## 2023-07-19 DIAGNOSIS — E876 Hypokalemia: Secondary | ICD-10-CM | POA: Diagnosis not present

## 2023-07-19 DIAGNOSIS — Z23 Encounter for immunization: Secondary | ICD-10-CM

## 2023-07-19 DIAGNOSIS — N1831 Chronic kidney disease, stage 3a: Secondary | ICD-10-CM

## 2023-07-19 DIAGNOSIS — D509 Iron deficiency anemia, unspecified: Secondary | ICD-10-CM

## 2023-07-19 DIAGNOSIS — S300XXA Contusion of lower back and pelvis, initial encounter: Secondary | ICD-10-CM

## 2023-07-19 LAB — CBC WITH DIFFERENTIAL/PLATELET
Basophils Absolute: 0.1 10*3/uL (ref 0.0–0.1)
Basophils Relative: 0.4 % (ref 0.0–3.0)
Eosinophils Absolute: 0.1 10*3/uL (ref 0.0–0.7)
Eosinophils Relative: 0.5 % (ref 0.0–5.0)
HCT: 32.6 % — ABNORMAL LOW (ref 36.0–46.0)
Hemoglobin: 10.7 g/dL — ABNORMAL LOW (ref 12.0–15.0)
Lymphocytes Relative: 14.9 % (ref 12.0–46.0)
Lymphs Abs: 2 10*3/uL (ref 0.7–4.0)
MCHC: 32.8 g/dL (ref 30.0–36.0)
MCV: 88 fL (ref 78.0–100.0)
Monocytes Absolute: 0.9 10*3/uL (ref 0.1–1.0)
Monocytes Relative: 6.6 % (ref 3.0–12.0)
Neutro Abs: 10.3 10*3/uL — ABNORMAL HIGH (ref 1.4–7.7)
Neutrophils Relative %: 77.6 % — ABNORMAL HIGH (ref 43.0–77.0)
Platelets: 538 10*3/uL — ABNORMAL HIGH (ref 150.0–400.0)
RBC: 3.71 Mil/uL — ABNORMAL LOW (ref 3.87–5.11)
RDW: 15.4 % (ref 11.5–15.5)
WBC: 13.2 10*3/uL — ABNORMAL HIGH (ref 4.0–10.5)

## 2023-07-19 LAB — COMPREHENSIVE METABOLIC PANEL
ALT: 13 U/L (ref 0–35)
AST: 19 U/L (ref 0–37)
Albumin: 4.1 g/dL (ref 3.5–5.2)
Alkaline Phosphatase: 81 U/L (ref 39–117)
BUN: 12 mg/dL (ref 6–23)
CO2: 23 meq/L (ref 19–32)
Calcium: 9.6 mg/dL (ref 8.4–10.5)
Chloride: 99 meq/L (ref 96–112)
Creatinine, Ser: 1.06 mg/dL (ref 0.40–1.20)
GFR: 59.87 mL/min — ABNORMAL LOW (ref >60.00)
Glucose, Bld: 94 mg/dL (ref 70–99)
Potassium: 2.7 meq/L — CL (ref 3.5–5.1)
Sodium: 132 meq/L — ABNORMAL LOW (ref 135–145)
Total Bilirubin: 0.4 mg/dL (ref 0.2–1.2)
Total Protein: 7.6 g/dL (ref 6.0–8.3)

## 2023-07-19 LAB — SEDIMENTATION RATE: Sed Rate: 91 mm/h — ABNORMAL HIGH (ref 0–30)

## 2023-07-19 MED ORDER — HYDROCODONE-ACETAMINOPHEN 5-325 MG PO TABS: 1.0000 | ORAL_TABLET | Freq: Four times a day (QID) | ORAL | 0 refills | Status: DC | PRN

## 2023-07-19 NOTE — Progress Notes (Signed)
Subjective:  Patient ID: Emily Park, female    DOB: 09/04/1970  Age: 52 y.o. MRN: [id number]  CC: Follow-up (3 Month Follow Up. Abscess on left glut x2-3 weeks ago. Hard to the touch, constant throbbing. Trouble sleeping and eating due to pain)   HPI Jariel Farid Gotcher presents for L buttock pain, mass on the left buttock.  Please fell in her bedroom on her butt 2-3 weeks ago.  She developed swelling over the left glued that was increasing in size and becoming more painful.  She has not been able to sit on her bottom straight due to pain.  She is here today with her husband [name].  She denies chills, fever, sweats, nausea, vomiting. She is chronically fatigued with a lack of appetite and not drinking enough water.  Outpatient Medications Prior to Visit  Medication Sig Dispense Refill   ACCRUFER 30 MG CAPS Take 1 capsule (30 mg total) by mouth 2 (two) times daily. 90 capsule 0   Cholecalciferol (VITAMIN D3) 50 MCG (2000 UT) capsule Take 2,000 Units by mouth daily.     clonazePAM (KLONOPIN) 1 MG disintegrating tablet Take 1 tablet (1 mg total) by mouth at bedtime. 30 tablet 1   gabapentin (NEURONTIN) 300 MG capsule Take 1 capsule (300 mg total) by mouth at bedtime. 30 capsule 1   hydrocortisone (ANUSOL-HC) 2.5 % rectal cream Apply 1 Application topically 4 (four) times daily as needed for hemorrhoids. 30 g 0   linaclotide (LINZESS) 145 MCG CAPS capsule Take 1 capsule (145 mcg total) by mouth daily before breakfast. 30 capsule 0   Multiple Vitamin (MULTIVITAMIN WITH MINERALS) TABS tablet Take 1 tablet by mouth every morning.     pantoprazole (PROTONIX) 40 MG tablet Take 1 tablet (40 mg total) by mouth 2 (two) times daily. 180 tablet 3   polyethylene glycol (MIRALAX / GLYCOLAX) 17 g packet Take 17 g by mouth 2 (two) times daily. (Patient taking differently: Take 17 g by mouth daily as needed for moderate constipation.) 14 each 0   polyvinyl alcohol (LIQUIFILM TEARS) 1.4 % ophthalmic solution  Place 1 drop into both eyes as needed for dry eyes. 15 mL 0   potassium chloride SA (KLOR-CON M20) 20 MEQ tablet Take 1 tablet (20 mEq total) by mouth 2 (two) times daily. 180 tablet 1   prazosin (MINIPRESS) 1 MG capsule Take 1 capsule (1 mg total) by mouth at bedtime. 30 capsule 0   rizatriptan (MAXALT) 10 MG tablet PLEASE SEE ATTACHED FOR DETAILED DIRECTIONS     sertraline (ZOLOFT) 100 MG tablet Take 1.5 tablets (150 mg total) by mouth daily. 45 tablet 2   Sodium Fluoride (PREVIDENT 5000 BOOSTER PLUS) 1.1 % PSTE As directed tid 100 mL 3   topiramate (TOPAMAX) 100 MG tablet Take 1 tablet (100 mg total) by mouth daily. 90 tablet 1   loratadine (CLARITIN) 10 MG tablet Take 1 tablet (10 mg total) by mouth daily as needed for allergies. (Patient not taking: Reported on 01/19/2023) 30 tablet 0   No facility-administered medications prior to visit.    ROS: Review of Systems  Constitutional:  Positive for fatigue. Negative for activity change, appetite change, chills, diaphoresis, fever and unexpected weight change.  HENT:  Negative for congestion, mouth sores, sinus pressure and voice change.   Eyes:  Negative for visual disturbance.  Respiratory:  Negative for cough and chest tightness.   Gastrointestinal:  Positive for nausea. Negative for abdominal distention, abdominal pain, diarrhea and vomiting.  Genitourinary:  Negative for difficulty urinating, frequency and vaginal pain.  Musculoskeletal:  Positive for gait problem. Negative for back pain.  Skin:  Positive for color change. Negative for pallor and rash.  Neurological:  Negative for dizziness, tremors, syncope, weakness, numbness and headaches.  Hematological:  Does not bruise/bleed easily.  Psychiatric/Behavioral:  Negative for confusion, self-injury, sleep disturbance and suicidal ideas.     Objective:  BP 94/60   Pulse 81   Temp 98.7 F (37.1 C) (Oral)   Ht 5\' 4"  (1.626 m)   Wt 118 lb 9.6 oz (53.8 kg)   SpO2 98%   BMI 20.36  kg/m   BP Readings from Last 3 Encounters:  07/19/23 94/60  03/20/23 130/86  01/11/23 118/80    Wt Readings from Last 3 Encounters:  07/19/23 118 lb 9.6 oz (53.8 kg)  03/20/23 120 lb (54.4 kg)  01/11/23 126 lb (57.2 kg)    Physical Exam Constitutional:      General: She is not in acute distress.    Appearance: She is well-developed. She is ill-appearing. She is not toxic-appearing or diaphoretic.  HENT:     Head: Normocephalic.     Right Ear: External ear normal.     Left Ear: External ear normal.     Nose: Nose normal.  Eyes:     General:        Right eye: No discharge.        Left eye: No discharge.     Conjunctiva/sclera: Conjunctivae normal.     Pupils: Pupils are equal, round, and reactive to light.  Neck:     Thyroid: No thyromegaly.     Vascular: No JVD.     Trachea: No tracheal deviation.  Cardiovascular:     Rate and Rhythm: Normal rate and regular rhythm.     Heart sounds: Normal heart sounds.  Pulmonary:     Effort: No respiratory distress.     Breath sounds: No stridor. No wheezing.  Abdominal:     General: Bowel sounds are normal. There is no distension.     Palpations: Abdomen is soft. There is no mass.     Tenderness: There is no abdominal tenderness. There is no guarding or rebound.  Musculoskeletal:        General: No tenderness.     Cervical back: Normal range of motion and neck supple. No rigidity.  Lymphadenopathy:     Cervical: No cervical adenopathy.  Skin:    Findings: No erythema or rash.  Neurological:     Mental Status: She is oriented to person, place, and time.     Cranial Nerves: No cranial nerve deficit.     Motor: No abnormal muscle tone.     Coordination: Coordination normal.     Gait: Gait abnormal.     Deep Tendon Reflexes: Reflexes normal.  Psychiatric:        Behavior: Behavior normal.        Thought Content: Thought content normal.        Judgment: Judgment normal.   She does not appear toxic She appears chronically  tired with chronically dry mouth mucosa She is alert oriented cooperative The gait is antalgic She is unable to sit straight due to pain   L buttock painful mass 10x10 cm, tender, pink on the surface, tender to palpation, slightly warm.  No fluctuation.  POC [name]:   Lab Results  Component Value Date   WBC 13.2 (H) 07/19/2023   HGB 10.7 (L) 07/19/2023  HCT 32.6 (L) 07/19/2023   PLT 538.0 (H) 07/19/2023   GLUCOSE 94 07/19/2023   CHOL 208 (H) 02/24/2014   TRIG 81.0 02/24/2014   HDL 54.90 02/24/2014   LDLCALC 137 (H) 02/24/2014   ALT 13 07/19/2023   AST 19 07/19/2023   NA 132 (L) 07/19/2023   K 2.7 (LL) 07/19/2023   CL 99 07/19/2023   CREATININE 1.06 07/19/2023   BUN 12 07/19/2023   CO2 23 07/19/2023   TSH 0.18 (L) 03/20/2023   INR 1.1 12/20/2022   HGBA1C 4.9 12/20/2022    No results found.  Assessment & Plan:   Problem List Items Addressed This Visit     Anemia, iron deficiency    Recurrent.  Obtain CBC/iron      Hypokalemia   CKD stage 3a, GFR 45-59 ml/min (HCC)    Resume good hydration.  Monitor GFR      Hematoma of buttock - Primary    Large traumatic hematoma of the left buttock, most likely.  Obtain CT scan of the pelvis to rule out other abnormalities.  No signs of bone fracture.  No signs of cellulitis/abscess, however, I would be concerned about the secondary infection.  Will obtain CBC, sed rate.  We may need to start on empiric antibiotic Show use Arnica gel, gentle heat, memory foam donut to remove pressure Go to ER if worse (increasing pain, swelling, fever etc.)      Relevant Orders   CBC with Differential/Platelet (Completed)   Comprehensive metabolic panel (Completed)   Sedimentation rate (Completed)   CT PELVIS WO CONTRAST   Other Visit Diagnoses     Flu vaccine need             Meds ordered this encounter  Medications   HYDROcodone-acetaminophen (NORCO/VICODIN) 5-325 MG tablet    Sig: Take 1 tablet by mouth every 6 (six) hours as  needed for severe pain (pain score 7-10).    Dispense:  20 tablet    Refill:  0      Follow-up: Return in about 2 weeks (around 08/02/2023) for a follow-up visit.  Sonda Primes, MD

## 2023-07-19 NOTE — Telephone Encounter (Signed)
Received a call - critical lab value - potassium 2.7. she is on kcl bid.  Has been taking.  Reports decreased po intake.  No diarrhea.  Will take of kcl now and then repeat x 1 in 4 hours.  Tomorrow will take kcl bid.  Informed her someone would be calling her to recheck her potassium.  Also discussed her other labs. (Cbc, met c and esr).  Discussed ER evaluation.  She does not feel ER evaluation needed. Scheduled for a scan tomorrow.  Informed if any change or worsening problems, she is to be evaluated.  Wanted you to be aware.  Thanks

## 2023-07-19 NOTE — Assessment & Plan Note (Addendum)
Resume good hydration.  Monitor GFR

## 2023-07-20 ENCOUNTER — Other Ambulatory Visit (HOSPITAL_COMMUNITY): Payer: Commercial Managed Care - PPO

## 2023-07-20 LAB — IRON,TIBC AND FERRITIN PANEL
%SAT: 4 % — ABNORMAL LOW (ref 16–45)
Ferritin: 28 ng/mL (ref 16–232)
Iron: 12 ug/dL — ABNORMAL LOW (ref 45–160)
TIBC: 287 ug/dL (ref 250–450)

## 2023-07-20 NOTE — Telephone Encounter (Signed)
Hi Emily Park, Thank you! AP

## 2023-07-20 NOTE — Telephone Encounter (Signed)
No problem.  FYI. I told her to contact your office about a f/u potassium and for further instructions regarding her potassium.  Thanks.

## 2023-07-21 ENCOUNTER — Encounter: Payer: Self-pay | Admitting: Internal Medicine

## 2023-07-21 ENCOUNTER — Other Ambulatory Visit: Payer: Self-pay | Admitting: Internal Medicine

## 2023-07-21 MED ORDER — AMOXICILLIN-POT CLAVULANATE 875-125 MG PO TABS: 1.0000 | ORAL_TABLET | Freq: Two times a day (BID) | ORAL | 0 refills | Status: DC

## 2023-07-21 NOTE — Assessment & Plan Note (Signed)
Recurrent.  Obtain CBC/iron

## 2023-07-21 NOTE — Assessment & Plan Note (Signed)
Large traumatic hematoma of the left buttock, most likely.  Obtain CT scan of the pelvis to rule out other abnormalities.  No signs of bone fracture.  No signs of cellulitis/abscess, however, I would be concerned about the secondary infection.  Will obtain CBC, sed rate.  We may need to start on empiric antibiotic Show use Arnica gel, gentle heat, memory foam donut to remove pressure Go to ER if worse (increasing pain, swelling, fever etc.)

## 2023-07-24 ENCOUNTER — Ambulatory Visit (HOSPITAL_COMMUNITY): Payer: Commercial Managed Care - PPO | Attending: Internal Medicine

## 2023-07-27 ENCOUNTER — Other Ambulatory Visit (HOSPITAL_COMMUNITY): Payer: Self-pay | Admitting: Psychiatry

## 2023-07-27 DIAGNOSIS — F411 Generalized anxiety disorder: Secondary | ICD-10-CM

## 2023-07-30 ENCOUNTER — Encounter: Payer: Self-pay | Admitting: Internal Medicine

## 2023-07-30 ENCOUNTER — Other Ambulatory Visit: Payer: Self-pay | Admitting: Internal Medicine

## 2023-07-30 ENCOUNTER — Ambulatory Visit (INDEPENDENT_AMBULATORY_CARE_PROVIDER_SITE_OTHER): Payer: Commercial Managed Care - PPO | Admitting: Internal Medicine

## 2023-07-30 VITALS — BP 102/64 | HR 66 | Temp 98.2°F | Ht 64.0 in | Wt 115.0 lb

## 2023-07-30 DIAGNOSIS — S300XXA Contusion of lower back and pelvis, initial encounter: Secondary | ICD-10-CM

## 2023-07-30 DIAGNOSIS — R112 Nausea with vomiting, unspecified: Secondary | ICD-10-CM

## 2023-07-30 DIAGNOSIS — D649 Anemia, unspecified: Secondary | ICD-10-CM

## 2023-07-30 DIAGNOSIS — E876 Hypokalemia: Secondary | ICD-10-CM

## 2023-07-30 DIAGNOSIS — D509 Iron deficiency anemia, unspecified: Secondary | ICD-10-CM

## 2023-07-30 DIAGNOSIS — D508 Other iron deficiency anemias: Secondary | ICD-10-CM

## 2023-07-30 LAB — CBC WITH DIFFERENTIAL/PLATELET
Basophils Absolute: 0.1 10*3/uL (ref 0.0–0.1)
Basophils Relative: 1.1 % (ref 0.0–3.0)
Eosinophils Absolute: 0.1 10*3/uL (ref 0.0–0.7)
Eosinophils Relative: 1.9 % (ref 0.0–5.0)
HCT: 37.5 % (ref 36.0–46.0)
Hemoglobin: 12.3 g/dL (ref 12.0–15.0)
Lymphocytes Relative: 39.5 % (ref 12.0–46.0)
Lymphs Abs: 2.6 10*3/uL (ref 0.7–4.0)
MCHC: 32.7 g/dL (ref 30.0–36.0)
MCV: 87.9 fL (ref 78.0–100.0)
Monocytes Absolute: 0.5 10*3/uL (ref 0.1–1.0)
Monocytes Relative: 7.5 % (ref 3.0–12.0)
Neutro Abs: 3.3 10*3/uL (ref 1.4–7.7)
Neutrophils Relative %: 50 % (ref 43.0–77.0)
Platelets: 705 10*3/uL — ABNORMAL HIGH (ref 150.0–400.0)
RBC: 4.26 Mil/uL (ref 3.87–5.11)
RDW: 15.2 % (ref 11.5–15.5)
WBC: 6.6 10*3/uL (ref 4.0–10.5)

## 2023-07-30 LAB — COMPREHENSIVE METABOLIC PANEL
ALT: 10 U/L (ref 0–35)
AST: 20 U/L (ref 0–37)
Albumin: 4.7 g/dL (ref 3.5–5.2)
Alkaline Phosphatase: 80 U/L (ref 39–117)
BUN: 12 mg/dL (ref 6–23)
CO2: 25 meq/L (ref 19–32)
Calcium: 10.3 mg/dL (ref 8.4–10.5)
Chloride: 99 meq/L (ref 96–112)
Creatinine, Ser: 1.18 mg/dL (ref 0.40–1.20)
GFR: 52.63 mL/min — ABNORMAL LOW (ref >60.00)
Glucose, Bld: 89 mg/dL (ref 70–99)
Potassium: 2.9 meq/L — ABNORMAL LOW (ref 3.5–5.1)
Sodium: 134 meq/L — ABNORMAL LOW (ref 135–145)
Total Bilirubin: 0.4 mg/dL (ref 0.2–1.2)
Total Protein: 8.6 g/dL — ABNORMAL HIGH (ref 6.0–8.3)

## 2023-07-30 NOTE — Assessment & Plan Note (Addendum)
Much better L buttock hematoma - 80% better Emily Park did not go to get a CT done

## 2023-07-30 NOTE — Assessment & Plan Note (Signed)
Accrufer - intolerant Check CBC

## 2023-07-30 NOTE — Assessment & Plan Note (Signed)
Augmentin gave stomach pain, nausea - d/c

## 2023-07-30 NOTE — Progress Notes (Signed)
Subjective:  Patient ID: Emily Park, female    DOB: 08/27/1971  Age: 52 y.o. MRN: [id number]  CC: Medical Management of Chronic Issues (2 week f/u, discuss hemoglobin )   HPI Emily Park presents for a buttock hematoma - much better, stomach pain on abx, low K and anemia...  Outpatient Medications Prior to Visit  Medication Sig Dispense Refill   ACCRUFER 30 MG CAPS Take 1 capsule (30 mg total) by mouth 2 (two) times daily. 90 capsule 0   Cholecalciferol (VITAMIN D3) 50 MCG (2000 UT) capsule Take 2,000 Units by mouth daily.     clonazePAM (KLONOPIN) 1 MG disintegrating tablet Take 1 tablet (1 mg total) by mouth at bedtime. 30 tablet 1   doxepin (SINEQUAN) 75 MG capsule Take 75 mg by mouth at bedtime.     gabapentin (NEURONTIN) 300 MG capsule Take 1 capsule (300 mg total) by mouth at bedtime. 30 capsule 1   HYDROcodone-acetaminophen (NORCO/VICODIN) 5-325 MG tablet Take 1 tablet by mouth every 6 (six) hours as needed for severe pain (pain score 7-10). 20 tablet 0   hydrocortisone (ANUSOL-HC) 2.5 % rectal cream Apply 1 Application topically 4 (four) times daily as needed for hemorrhoids. 30 g 0   hyoscyamine (LEVSIN SL) 0.125 MG SL tablet Take by mouth.     linaclotide (LINZESS) 145 MCG CAPS capsule Take 1 capsule (145 mcg total) by mouth daily before breakfast. 30 capsule 0   LYLLANA 0.05 MG/24HR patch Place 1 patch onto the skin.     Multiple Vitamin (MULTIVITAMIN WITH MINERALS) TABS tablet Take 1 tablet by mouth every morning.     pantoprazole (PROTONIX) 40 MG tablet Take 1 tablet (40 mg total) by mouth 2 (two) times daily. 180 tablet 3   polyethylene glycol (MIRALAX / GLYCOLAX) 17 g packet Take 17 g by mouth 2 (two) times daily. (Patient taking differently: Take 17 g by mouth daily as needed for moderate constipation.) 14 each 0   polyvinyl alcohol (LIQUIFILM TEARS) 1.4 % ophthalmic solution Place 1 drop into both eyes as needed for dry eyes. 15 mL 0   potassium chloride SA  (KLOR-CON M20) 20 MEQ tablet Take 1 tablet (20 mEq total) by mouth 2 (two) times daily. 180 tablet 1   prazosin (MINIPRESS) 1 MG capsule TAKE 1 CAPSULE BY MOUTH AT BEDTIME. 30 capsule 0   progesterone (PROMETRIUM) 100 MG capsule Take 100 mg by mouth at bedtime.     promethazine (PHENERGAN) 12.5 MG tablet Take 12.5 mg by mouth.     rizatriptan (MAXALT) 10 MG tablet PLEASE SEE ATTACHED FOR DETAILED DIRECTIONS     sertraline (ZOLOFT) 100 MG tablet Take 1.5 tablets (150 mg total) by mouth daily. 45 tablet 2   Sodium Fluoride (PREVIDENT 5000 BOOSTER PLUS) 1.1 % PSTE As directed tid 100 mL 3   topiramate (TOPAMAX) 100 MG tablet Take 1 tablet (100 mg total) by mouth daily. 90 tablet 1   XIFAXAN 550 MG TABS tablet Take 550 mg by mouth 3 (three) times daily.     amoxicillin-clavulanate (AUGMENTIN) 875-125 MG tablet Take 1 tablet by mouth 2 (two) times daily. 20 tablet 0   loratadine (CLARITIN) 10 MG tablet Take 1 tablet (10 mg total) by mouth daily as needed for allergies. (Patient not taking: Reported on 01/19/2023) 30 tablet 0   No facility-administered medications prior to visit.    ROS: Review of Systems  Constitutional:  Positive for fatigue. Negative for activity change, appetite change, chills and  unexpected weight change.  HENT:  Negative for congestion, mouth sores and sinus pressure.   Eyes:  Negative for visual disturbance.  Respiratory:  Negative for cough and chest tightness.   Gastrointestinal:  Negative for abdominal pain and nausea.  Genitourinary:  Negative for difficulty urinating, frequency and vaginal pain.  Musculoskeletal:  Negative for back pain and gait problem.  Skin:  Negative for pallor and rash.  Neurological:  Negative for dizziness, tremors, weakness, numbness and headaches.  Psychiatric/Behavioral:  Negative for confusion and sleep disturbance.     Objective:  BP 102/64 (BP Location: Right Arm, Patient Position: Sitting, Cuff Size: Normal)   Pulse 66   Temp 98.2  F (36.8 C) (Oral)   Ht 5\' 4"  (1.626 m)   Wt 115 lb (52.2 kg)   SpO2 94%   BMI 19.74 kg/m   BP Readings from Last 3 Encounters:  07/30/23 102/64  07/19/23 94/60  03/20/23 130/86    Wt Readings from Last 3 Encounters:  07/30/23 115 lb (52.2 kg)  07/19/23 118 lb 9.6 oz (53.8 kg)  03/20/23 120 lb (54.4 kg)    Physical Exam Constitutional:      General: She is not in acute distress.    Appearance: Normal appearance. She is well-developed. She is not ill-appearing or toxic-appearing.  HENT:     Head: Normocephalic.     Right Ear: External ear normal.     Left Ear: External ear normal.     Nose: Nose normal.  Eyes:     General:        Right eye: No discharge.        Left eye: No discharge.     Conjunctiva/sclera: Conjunctivae normal.     Pupils: Pupils are equal, round, and reactive to light.  Neck:     Thyroid: No thyromegaly.     Vascular: No JVD.     Trachea: No tracheal deviation.  Cardiovascular:     Rate and Rhythm: Normal rate and regular rhythm.     Heart sounds: Normal heart sounds.  Pulmonary:     Effort: No respiratory distress.     Breath sounds: No stridor. No wheezing.  Abdominal:     General: Bowel sounds are normal. There is no distension.     Palpations: Abdomen is soft. There is no mass.     Tenderness: There is no abdominal tenderness. There is no guarding or rebound.  Musculoskeletal:        General: No tenderness.     Cervical back: Normal range of motion and neck supple. No rigidity.  Lymphadenopathy:     Cervical: No cervical adenopathy.  Skin:    Findings: No erythema or rash.  Neurological:     Cranial Nerves: No cranial nerve deficit.     Motor: No abnormal muscle tone.     Coordination: Coordination normal.     Deep Tendon Reflexes: Reflexes normal.  Psychiatric:        Behavior: Behavior normal.        Thought Content: Thought content normal.        Judgment: Judgment normal.   L buttock hematoma - 80% better Looks much  better  Lab Results  Component Value Date   WBC 13.2 (H) 07/19/2023   HGB 10.7 (L) 07/19/2023   HCT 32.6 (L) 07/19/2023   PLT 538.0 (H) 07/19/2023   GLUCOSE 94 07/19/2023   CHOL 208 (H) 02/24/2014   TRIG 81.0 02/24/2014   HDL 54.90 02/24/2014   LDLCALC  137 (H) 02/24/2014   ALT 13 07/19/2023   AST 19 07/19/2023   NA 132 (L) 07/19/2023   K 2.7 (LL) 07/19/2023   CL 99 07/19/2023   CREATININE 1.06 07/19/2023   BUN 12 07/19/2023   CO2 23 07/19/2023   TSH 0.18 (L) 03/20/2023   INR 1.1 12/20/2022   HGBA1C 4.9 12/20/2022    No results found.  Assessment & Plan:   Problem List Items Addressed This Visit     Intractable vomiting with nausea - Primary    Augmentin gave stomach pain, nausea - d/c      Anemia, iron deficiency    Hem consult to consider IV iron      Hypokalemia    Worse On Kdur      Chronic anemia    Accrufer - intolerant Check CBC      Hematoma of buttock    Much better L buttock hematoma - 80% better Chetana did not go to get a CT done      Relevant Orders   Comprehensive metabolic panel   CBC with Differential/Platelet      No orders of the defined types were placed in this encounter.     Follow-up: Return in about 2 months (around 09/30/2023) for a follow-up visit.  Sonda Primes, MD

## 2023-07-30 NOTE — Assessment & Plan Note (Signed)
Hem consult to consider IV iron

## 2023-07-30 NOTE — Assessment & Plan Note (Signed)
Worse On Kdur

## 2023-08-01 NOTE — Progress Notes (Unsigned)
BH MD/PA/NP OP Progress Note  08/02/2023 5:22 PM Emily Park  MRN:  [id number]  Visit Diagnosis:    ICD-10-CM   1. Generalized anxiety disorder  F41.1 QUEtiapine (SEROQUEL) 50 MG tablet    prazosin (MINIPRESS) 1 MG capsule    gabapentin (NEURONTIN) 300 MG capsule    sertraline (ZOLOFT) 100 MG tablet    2. MDD (major depressive disorder), recurrent severe, without psychosis (HCC)  F33.2 QUEtiapine (SEROQUEL) 50 MG tablet    prazosin (MINIPRESS) 1 MG capsule    sertraline (ZOLOFT) 100 MG tablet    3. Migraine without status migrainosus, not intractable, unspecified migraine type  G43.909 topiramate (TOPAMAX) 100 MG tablet       Assessment: Emily Park is a 52 y.o. female with a history of MDD with suicide attempt and subsequent hospitalization in May 2024, GAD, reported ADHD, and IBS who presented to [facility] for initial evaluation on 03/27/2023.  At initial evaluation patient reported struggling with neurovegetative symptoms of depression including low mood, anhedonia, amotivation, disturbed sleep, hopelessness, feelings of worthlessness, and negative self thoughts.  She denied any SI/HI or thoughts of self-harm though had acted on suicidal thoughts in May 2024.  Crisis resources were reviewed and patient has completed safety planning.  Medications are managed by her husband and she is able to list a number of supports she can reach out to if needed.  Patient also struggled with symptoms of anxiety including excessive worry, fear of something awful happening, restlessness, palpitations, chest pressure, and shortness of breath.  At times the anxiety can progress to panic episodes.  Patient does have a past trauma history from which she experiences nightmares and hypervigilance though she denies any other symptoms consistent with PTSD.  Also of note she has been prescribed high-dose benzodiazepines for over a decade and is in the process of tapering  off of them.  Psychosocially patient has good supports in husband, family, and friends.  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  There is increased financial stressors at time of initial evaluation.  Patient met criteria for MDD and GAD.  She did have a former diagnosis of ADHD this could be complicated by her other underlying psychiatric disorders and further neurological testing would be appropriate.  Emily Park presents for follow-up evaluation. Today, 08/02/23, patient reports continued improvement in her depression and anxiety.  There are continued stressors with her husband's ongoing treatment however patient has been able to handle this fairly well.  She did self discontinue the Klonopin in the interim which was reviewed.  We will start Seroquel 50 mg at bedtime for insomnia and increase gabapentin to 300 mg 3 times daily as needed for anxiety.  Risk and benefits of both of these were reviewed.  Patient has connected with a grief group that she meets with once a week.  She also plans to restart with her grief counselor after the holidays.  We will continue on the remainder of her current regimen and follow up in 6 weeks.  Plan:  - Continue Zoloft 150 mg daily - Start Seroquel 50 mg QHS - Increase Gabapentin to 300 mg TID prn for anxiety - Continue Prazosin 1 mg QHS - Continue Topamax 100 mg in the morning for migraines - Discontinue Klonopin 1 mg at bedtime - Attending a group for grief counseling once a week which is 13 weeks long -  Will restart with grief counseling when able - Completed PHP and part of IOP - Neuropsysch testing referral - Crisis resources reviewed - Follow up in 6 weeks  Chief Complaint:  Chief Complaint  Patient presents with   Follow-up   HPI:  Emily Park presents reporting that she feels like she is in better spirits today.  Overall she does feel like her mood has been gradually improving.  She had a good  Thanksgiving with her family and they are working to make a tradition to all get together to share remember the positives about her father.  Her husband has also been getting through his chemotherapy and had his third treatment today.  After his next treatment they will go to see if the tumor shrunk.  Emily Park has felt that getting through this has been rough and there are times she can get more depressed but it is transient in nature.  She denies any thoughts of self-harm or suicide.  Medication wise Emily Park wants to try to come off the Klonopin.  She has actually self-discontinued the medication on Saturday which we discussed and advised contacting her provider before making medication changes.  That said she denied significant withdrawal symptoms.  There has been some nausea however she also was found to have low potassium and anemia and was prescribed supplements for both.  Both of these supplements to make her feel a bit nauseous.  As nausea still persists it is likely that had limited relation to the Klonopin withdrawal.  Her sleep has declined slightly with the discontinuation of Klonopin.  These will get in bed around 10 30-11 and then may take an hour to fall asleep.  We had discussed some options and she was interested in starting Seroquel.  Reviewed the risk and benefits of this medication.  Patient is also interested in increasing the frequency of gabapentin.  She has found it to be especially helpful for anxiety and thinks it could be beneficial if she had the option to use it if needed during the day.  Past Psychiatric History: Patient was admitted to [facility] in 12/2022.  After which she stepped down to PHP on 01/16/2023 followed by IOP on 02/08/2023.  Patient had been admitted to the hospital following a suicide attempt by insulin overdose.  Prior to this she had no other suicide attempts or episodes of self-harm. OPT: Dr. Evelene Croon 10+ years Therapist: [facility] 2x, but no insurance coverage,  appointment scheduled with [facility] Previous: Klonopin 1mg  QHS and doxepin 75mg  QHS, patient has been on Klonopin 10 years, they were rx Klonopin 1mg , TID but she gets sedated so was only taking BID/QHS until around 09/2022, Abilify, Prozac (worked then pooped out after 20+ years), Trintellix, doxepin (poor benefit), ( Xanax, Wellbutrin ( failed), BuSar (ineffective), clonidine, mirtazapine, Atarax (ineffective), Ambien ( behavior changes, spending money suddenly). Trazodone did not help with sleep.   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  Patient is being tapered off of Klonopin now.  She denies any current ethanol use though had in the past.  Patient discontinued due to developing stomach ulcers.  She denies any THC, tobacco, or other substance use.  Past Medical History:  Past Medical History:  Diagnosis Date   Abnormal TSH    ADD (attention deficit disorder)    Anemia    Anxiety    Arrhythmia    Decreased libido    Depression  Heart murmur    Hyperlipidemia    Hypokalemia    Migraine    Nephrolithiasis    Pap smear abnormality of cervix/human papillomavirus (HPV) positive    Sleep difficulties    SVT (supraventricular tachycardia) (HCC)     Past Surgical History:  Procedure Laterality Date   ABLATION     Cardiac ablation, endometrial ablation   BREAST LUMPECTOMY     right breast   BREAST LUMPECTOMY Right    CARDIOVERSION     COLONOSCOPY WITH PROPOFOL N/A 05/16/2021   Procedure: COLONOSCOPY WITH PROPOFOL;  Surgeon: Charlott Rakes, MD;  Location: [facility];  Service: Endoscopy;  Laterality: N/A;   COLPOSCOPY     w/ cervical biopsy   DILITATION & CURRETTAGE/HYSTROSCOPY WITH NOVASURE ABLATION N/A 09/11/2014   Procedure: DILATATION & CURETTAGE/HYSTEROSCOPY WITH NOVASURE ABLATION;  Surgeon: Lenoard Aden, MD;  Location: [facility];  Service: Gynecology;  Laterality: N/A;   GIVENS CAPSULE STUDY N/A  05/16/2021   Procedure: GIVENS CAPSULE STUDY;  Surgeon: Charlott Rakes, MD;  Location: [facility];  Service: Endoscopy;  Laterality: N/A;   XI ROBOT ASSISTED RECTOPEXY N/A 07/07/2021   Procedure: XI ROBOT ASSISTED RECTOPEXY;  Surgeon: Karie Soda, MD;  Location: [facility];  Service: General;  Laterality: N/A;   XI ROBOTIC ASSISTED LOWER ANTERIOR RESECTION N/A 07/07/2021   Procedure: XI ROBOTIC ASSISTED LOW ANTERIOR RECTOSIGMOID RESECTION WITH RIGID PROCTOSCOPY, TAP BLOCK;  Surgeon: Karie Soda, MD;  Location: [facility];  Service: General;  Laterality: N/A;   Family History:  Family History  Problem Relation Age of Onset   Hypothyroidism Mother    Depression Mother    Cancer Mother        thyroid   Thyroid disease Mother    Alcohol abuse Father    Hypertension Father    Heart attack Father        Age 3   AAA (abdominal aortic aneurysm) Father    Depression Sister    Hypothyroidism Sister    Hyperthyroidism Sister    Diabetes Maternal Grandfather    Depression Maternal Grandmother    Breast cancer Paternal Grandmother    Depression Cousin     Social History:  Social History   Socioeconomic History   Marital status: Married    Spouse name: Not on file   Number of children: 2   Years of education: college   Highest education level: Associate degree: academic program  Occupational History   Occupation: Charity fundraiser with Engineer, manufacturing systems: [employer]   Tobacco Use   Smoking status: Never    Passive exposure: Current   Smokeless tobacco: Never  Vaping Use   Vaping status: Never Used  Substance and Sexual Activity   Alcohol use: Not Currently    Alcohol/week: 2.0 standard drinks of alcohol    Types: 2 Glasses of wine per week   Drug use: Not Currently   Sexual activity: Yes    Partners: Male    Comment: Married  Other Topics Concern   Not on file  Social History Narrative   Lives at home with two sons.   Right-handed.   60 ounces of tea and soda per day.   Social Determinants  of Health   Financial Resource Strain: Low Risk  (10/16/2018)   Received from [facility] visits prior to 10/28/2022., [facility] visits prior to 10/28/2022.   Overall Financial Resource Strain (CARDIA)    Difficulty of Paying Living Expenses: Not hard at all  Food Insecurity: Food Insecurity Present (12/28/2022)   Hunger Vital Sign    Worried About Running Out of Food in the Last Year: Sometimes true    Ran Out of Food in the Last Year: Sometimes true  Transportation Needs: No Transportation Needs (12/28/2022)   PRAPARE - Administrator, Civil Service (Medical): No    Lack of Transportation (Non-Medical): No  Physical Activity: Not on file  Stress: Not on file  Social Connections: Unknown (01/12/2022)   [facility] from [facility]   Social Network    Social Network: Not on file    Allergies:  Allergies  Allergen Reactions   Augmentin [Amoxicillin-Pot Clavulanate]     Stomach pain   Zithromax [Azithromycin] Rash    Current Medications: Current Outpatient Medications  Medication Sig Dispense Refill   QUEtiapine (SEROQUEL) 50 MG tablet Take 1 tablet (50 mg total) by mouth at bedtime. 90 tablet 0   ACCRUFER 30 MG CAPS Take 1 capsule (30 mg total) by mouth 2 (two) times daily. 90 capsule 0   aspirin EC 81 MG tablet Take 1 tablet (81 mg total) by mouth daily.     Cholecalciferol (VITAMIN D3) 50 MCG (2000 UT) capsule Take 2,000 Units by mouth daily.     gabapentin (NEURONTIN) 300 MG capsule Take 1 capsule (300 mg total) by mouth 3 (three) times daily as needed. 270 capsule 0   HYDROcodone-acetaminophen (NORCO/VICODIN) 5-325 MG tablet Take 1 tablet by mouth every 6 (six) hours as needed for severe pain (pain score 7-10). 20 tablet 0   hydrocortisone (ANUSOL-HC) 2.5 % rectal cream Apply 1 Application topically 4 (four) times daily as needed for hemorrhoids. 30 g 0   hyoscyamine (LEVSIN SL) 0.125 MG SL tablet Take by mouth.      linaclotide (LINZESS) 145 MCG CAPS capsule Take 1 capsule (145 mcg total) by mouth daily before breakfast. 30 capsule 0   LYLLANA 0.05 MG/24HR patch Place 1 patch onto the skin.     Multiple Vitamin (MULTIVITAMIN WITH MINERALS) TABS tablet Take 1 tablet by mouth every morning.     pantoprazole (PROTONIX) 40 MG tablet Take 1 tablet (40 mg total) by mouth 2 (two) times daily. 180 tablet 3   polyethylene glycol (MIRALAX / GLYCOLAX) 17 g packet Take 17 g by mouth 2 (two) times daily. (Patient taking differently: Take 17 g by mouth daily as needed for moderate constipation.) 14 each 0   polyvinyl alcohol (LIQUIFILM TEARS) 1.4 % ophthalmic solution Place 1 drop into both eyes as needed for dry eyes. 15 mL 0   potassium chloride SA (KLOR-CON M20) 20 MEQ tablet Take 1 tablet (20 mEq total) by mouth 2 (two) times daily. 180 tablet 1   prazosin (MINIPRESS) 1 MG capsule Take 1 capsule (1 mg total) by mouth at bedtime. 90 capsule 0   progesterone (PROMETRIUM) 100 MG capsule Take 100 mg by mouth at bedtime.     promethazine (PHENERGAN) 12.5 MG tablet Take 12.5 mg by mouth.     rizatriptan (MAXALT) 10 MG tablet PLEASE SEE ATTACHED FOR DETAILED DIRECTIONS     sertraline (ZOLOFT) 100 MG tablet Take 1.5 tablets (150 mg total) by mouth daily. 135 tablet 0   Sodium Fluoride (PREVIDENT 5000 BOOSTER PLUS) 1.1 % PSTE As directed tid 100 mL 3   topiramate (TOPAMAX) 100 MG tablet Take 1 tablet (100 mg total) by mouth daily. 90 tablet 1   XIFAXAN 550 MG TABS tablet Take 550 mg by mouth  3 (three) times daily.     No current facility-administered medications for this visit.     Psychiatric Specialty Exam: Review of Systems  There were no vitals taken for this visit.There is no height or weight on file to calculate BMI.  General Appearance: Fairly Groomed  Eye Contact:  Fair  Speech:  Clear and Coherent  Volume:  Normal  Mood:  Euthymic  Affect:  Congruent  Thought Process:  Coherent  Orientation:  Full (Time,  Place, and Person)  Thought Content: Logical   Suicidal Thoughts:  No  Homicidal Thoughts:  No  Memory:  Immediate;   Fair  Judgement:  Fair  Insight:  Fair  Psychomotor Activity:  Decreased  Concentration:  Concentration: Fair  Recall:  Fair  Fund of Knowledge: Fair  Language: Good  Akathisia:  No    AIMS (if indicated): not done  Assets:  Communication Skills Desire for Improvement Financial Resources/Insurance Housing Social Support  ADL's:  Intact  Cognition: WNL  Sleep:  Fair   Metabolic Disorder Labs: Lab Results  Component Value Date   HGBA1C 4.9 12/20/2022   MPG 93.93 12/20/2022   MPG 93.93 07/06/2021   No results found for: "PROLACTIN" Lab Results  Component Value Date   CHOL 208 (H) 02/24/2014   TRIG 81.0 02/24/2014   HDL 54.90 02/24/2014   CHOLHDL 4 02/24/2014   VLDL 16.2 02/24/2014   LDLCALC 137 (H) 02/24/2014   Lab Results  Component Value Date   TSH 0.18 (L) 03/20/2023   TSH 0.175 (L) 12/21/2022    Therapeutic Level Labs: No results found for: "LITHIUM" No results found for: "VALPROATE" No results found for: "CBMZ"   Screenings: AIMS    Flowsheet Row [facility]  AIMS Total Score 0      AUDIT    Flowsheet Row Ad[facility] ([facility]  Alcohol Use Disorder Identification Test Final Score (AUDIT) 1      GAD-7    Flowsheet Row Office Visit from 03/20/2023 in [facility] [place] Counselor from 01/09/2023 in BEHAVIORAL HEALTH PARTIAL HOSPITALIZATION PROGRAM Office Visit from 11/22/2022 in [facility]  Total GAD-7 Score 7 15 12       PHQ2-9    Flowsheet Row Office Visit from 07/30/2023 in [facility] [place] Counselor from 06/14/2023 in BEHAVIORAL HEALTH INTENSIVE PSYCH Counselor from 06/08/2023 in BEHAVIORAL HEALTH  PARTIAL HOSPITALIZATION PROGRAM Counselor from 06/01/2023 in BEHAVIORAL HEALTH PARTIAL HOSPITALIZATION PROGRAM Counselor from 05/03/2023 in BEHAVIORAL HEALTH PARTIAL HOSPITALIZATION PROGRAM  PHQ-2 Total Score 0 2 2 2 4   PHQ-9 Total Score -- 9 6 17 14       Flowsheet Row Counselor from 06/14/2023 in BEHAVIORAL HEALTH INTENSIVE PSYCH Counselor from 06/01/2023 in BEHAVIORAL HEALTH PARTIAL HOSPITALIZATION PROGRAM Counselor from 05/03/2023 in BEHAVIORAL HEALTH PARTIAL HOSPITALIZATION PROGRAM  C-SSRS RISK CATEGORY Error: Question 6 not populated No Risk Error: Question 2 not populated       Collaboration of Care: Collaboration of Care: Medication Management AEB medication prescription and Other provider involved in patient's care AEB internal and gasto chart review  Patient/Guardian was advised Release of Information must be obtained prior to any record release in order to collaborate their care with an outside provider. Patient/Guardian was advised if they have not already done so to contact the registration department to sign all necessary forms in order for [name] to  release information regarding their care.   Consent: Patient/Guardian gives verbal consent for treatment and assignment of benefits for services provided during this visit. Patient/Guardian expressed understanding and agreed to proceed.    Stasia Cavalier, MD 08/02/2023, 5:22 PM   Virtual Visit via Video Note  I connected with Emily Park on 08/02/23 at  4:30 PM EST by a video enabled telemedicine application and verified that I am speaking with the correct person using two identifiers.  Location: Patient: Home Provider: Home Office   I discussed the limitations of evaluation and management by telemedicine and the availability of in person appointments. The patient expressed understanding and agreed to proceed.   I discussed the assessment and treatment plan with the patient. The patient was provided an opportunity to ask questions  and all were answered. The patient agreed with the plan and demonstrated an understanding of the instructions.   The patient was advised to call back or seek an in-person evaluation if the symptoms worsen or if the condition fails to improve as anticipated.  I provided 20 minutes of non-face-to-face time during this encounter.   Stasia Cavalier, MD

## 2023-08-02 ENCOUNTER — Encounter (HOSPITAL_COMMUNITY): Payer: Self-pay | Admitting: Psychiatry

## 2023-08-02 ENCOUNTER — Telehealth (HOSPITAL_COMMUNITY): Payer: Commercial Managed Care - PPO | Admitting: Psychiatry

## 2023-08-02 ENCOUNTER — Other Ambulatory Visit: Payer: Self-pay | Admitting: Internal Medicine

## 2023-08-02 DIAGNOSIS — F332 Major depressive disorder, recurrent severe without psychotic features: Secondary | ICD-10-CM

## 2023-08-02 DIAGNOSIS — F411 Generalized anxiety disorder: Secondary | ICD-10-CM

## 2023-08-02 DIAGNOSIS — G43909 Migraine, unspecified, not intractable, without status migrainosus: Secondary | ICD-10-CM

## 2023-08-02 MED ORDER — PRAZOSIN HCL 1 MG PO CAPS: 1.0000 mg | ORAL_CAPSULE | Freq: Every day | ORAL | 0 refills | Status: DC

## 2023-08-02 MED ORDER — TOPIRAMATE 100 MG PO TABS
100.0000 mg | ORAL_TABLET | Freq: Every day | ORAL | 1 refills | Status: DC
Start: 2023-08-02 — End: 2023-11-26

## 2023-08-02 MED ORDER — GABAPENTIN 300 MG PO CAPS: 300.0000 mg | ORAL_CAPSULE | Freq: Three times a day (TID) | ORAL | 0 refills | Status: DC | PRN

## 2023-08-02 MED ORDER — QUETIAPINE FUMARATE 50 MG PO TABS: 50.0000 mg | ORAL_TABLET | Freq: Every day | ORAL | 0 refills | Status: DC

## 2023-08-02 MED ORDER — SERTRALINE HCL 100 MG PO TABS: 150.0000 mg | ORAL_TABLET | Freq: Every day | ORAL | 0 refills | Status: DC

## 2023-08-02 MED ORDER — ASPIRIN 81 MG PO TBEC: 81.0000 mg | DELAYED_RELEASE_TABLET | Freq: Every day | ORAL | Status: AC

## 2023-08-10 ENCOUNTER — Telehealth: Payer: Self-pay | Admitting: Physician Assistant

## 2023-08-10 NOTE — Telephone Encounter (Signed)
Scheduled appointments per "re-referral".  Patient was encouraged to go to next appointment due to the amount of no shows and cancellations. Patient expressed understanding. Patient is aware of the made appointments.

## 2023-08-29 ENCOUNTER — Other Ambulatory Visit: Payer: Self-pay | Admitting: Nurse Practitioner

## 2023-08-29 DIAGNOSIS — D508 Other iron deficiency anemias: Secondary | ICD-10-CM

## 2023-08-29 NOTE — Progress Notes (Deleted)
 Patient Care Team: Plotnikov, Karlynn GAILS, MD as PCP - General (Internal Medicine) Sheldon Standing, MD as Consulting Physician (Colon and Rectal Surgery) Elicia Claw, MD as Consulting Physician (Gastroenterology) Voncile Gang, MD (Gastroenterology) Vincente Grip, MD as Consulting Physician (Psychiatry)   CHIEF COMPLAINT:   Oncology History   No history exists.     CURRENT THERAPY:   INTERVAL HISTORY   ROS   Past Medical History:  Diagnosis Date   Abnormal TSH    ADD (attention deficit disorder)    Anemia    Anxiety    Arrhythmia    Decreased libido    Depression    Heart murmur    Hyperlipidemia    Hypokalemia    Migraine    Nephrolithiasis    Pap smear abnormality of cervix/human papillomavirus (HPV) positive    Sleep difficulties    SVT (supraventricular tachycardia) (HCC)      Past Surgical History:  Procedure Laterality Date   ABLATION     Cardiac ablation, endometrial ablation   BREAST LUMPECTOMY     right breast   BREAST LUMPECTOMY Right    CARDIOVERSION     COLONOSCOPY WITH PROPOFOL  N/A 05/16/2021   Procedure: COLONOSCOPY WITH PROPOFOL ;  Surgeon: Dianna Specking, MD;  Location: [facility];  Service: Endoscopy;  Laterality: N/A;   COLPOSCOPY     w/ cervical biopsy   DILITATION & CURRETTAGE/HYSTROSCOPY WITH NOVASURE ABLATION N/A 09/11/2014   Procedure: DILATATION & CURETTAGE/HYSTEROSCOPY WITH NOVASURE ABLATION;  Surgeon: Charlie JINNY Flowers, MD;  Location: [facility];  Service: Gynecology;  Laterality: N/A;   GIVENS CAPSULE STUDY N/A 05/16/2021   Procedure: GIVENS CAPSULE STUDY;  Surgeon: Dianna Specking, MD;  Location: [facility];  Service: Endoscopy;  Laterality: N/A;   XI ROBOT ASSISTED RECTOPEXY N/A 07/07/2021   Procedure: XI ROBOT ASSISTED RECTOPEXY;  Surgeon: Sheldon Standing, MD;  Location: [facility];  Service: General;  Laterality: N/A;   XI ROBOTIC ASSISTED LOWER ANTERIOR RESECTION N/A 07/07/2021   Procedure: XI ROBOTIC ASSISTED LOW ANTERIOR  RECTOSIGMOID RESECTION WITH RIGID PROCTOSCOPY, TAP BLOCK;  Surgeon: Sheldon Standing, MD;  Location: [facility];  Service: General;  Laterality: N/A;     Outpatient Encounter Medications as of 08/30/2023  Medication Sig Note   ACCRUFER  30 MG CAPS Take 1 capsule (30 mg total) by mouth 2 (two) times daily.    aspirin  EC 81 MG tablet Take 1 tablet (81 mg total) by mouth daily.    Cholecalciferol (VITAMIN D3) 50 MCG (2000 UT) capsule Take 2,000 Units by mouth daily.    gabapentin  (NEURONTIN ) 300 MG capsule Take 1 capsule (300 mg total) by mouth 3 (three) times daily as needed.    HYDROcodone -acetaminophen  (NORCO/VICODIN) 5-325 MG tablet Take 1 tablet by mouth every 6 (six) hours as needed for severe pain (pain score 7-10).    hydrocortisone  (ANUSOL -HC) 2.5 % rectal cream Apply 1 Application topically 4 (four) times daily as needed for hemorrhoids.    hyoscyamine  (LEVSIN  SL) 0.125 MG SL tablet Take by mouth.    linaclotide  (LINZESS ) 145 MCG CAPS capsule Take 1 capsule (145 mcg total) by mouth daily before breakfast.    LYLLANA 0.05 MG/24HR patch Place 1 patch onto the skin.    Multiple Vitamin (MULTIVITAMIN WITH MINERALS) TABS tablet Take 1 tablet by mouth every morning.    pantoprazole  (PROTONIX ) 40 MG tablet Take 1 tablet (40 mg total) by mouth 2 (two) times daily.    polyethylene glycol (MIRALAX  / GLYCOLAX ) 17 g packet Take 17 g  by mouth 2 (two) times daily. (Patient taking differently: Take 17 g by mouth daily as needed for moderate constipation.)    polyvinyl alcohol  (LIQUIFILM TEARS) 1.4 % ophthalmic solution Place 1 drop into both eyes as needed for dry eyes.    potassium chloride  SA (KLOR-CON  M20) 20 MEQ tablet Take 1 tablet (20 mEq total) by mouth 2 (two) times daily.    prazosin  (MINIPRESS ) 1 MG capsule Take 1 capsule (1 mg total) by mouth at bedtime.    progesterone (PROMETRIUM) 100 MG capsule Take 100 mg by mouth at bedtime.    promethazine  (PHENERGAN ) 12.5 MG tablet Take 12.5 mg by mouth.     QUEtiapine  (SEROQUEL ) 50 MG tablet Take 1 tablet (50 mg total) by mouth at bedtime.    rizatriptan  (MAXALT ) 10 MG tablet PLEASE SEE ATTACHED FOR DETAILED DIRECTIONS 01/26/2023: Takes only as needed   sertraline  (ZOLOFT ) 100 MG tablet Take 1.5 tablets (150 mg total) by mouth daily.    Sodium Fluoride  (PREVIDENT  5000 BOOSTER PLUS) 1.1 % PSTE As directed tid    topiramate  (TOPAMAX ) 100 MG tablet Take 1 tablet (100 mg total) by mouth daily.    XIFAXAN 550 MG TABS tablet Take 550 mg by mouth 3 (three) times daily.    No facility-administered encounter medications on file as of 08/30/2023.     There were no vitals filed for this visit. There is no height or weight on file to calculate BMI.   PHYSICAL EXAM GENERAL:alert, no distress and comfortable SKIN: no rash  EYES: sclera clear NECK: without mass LYMPH:  no palpable cervical or supraclavicular lymphadenopathy  LUNGS: clear with normal breathing effort HEART: regular rate & rhythm, no lower extremity edema ABDOMEN: abdomen soft, non-tender and normal bowel sounds NEURO: alert & oriented x 3 with fluent speech, no focal motor/sensory deficits Breast exam:  PAC without erythema    CBC    Component Value Date/Time   WBC 6.6 07/30/2023 1049   RBC 4.26 07/30/2023 1049   HGB 12.3 07/30/2023 1049   HGB 14.1 03/03/2022 0823   HCT 37.5 07/30/2023 1049   PLT 705.0 (H) 07/30/2023 1049   PLT 302 03/03/2022 0823   MCV 87.9 07/30/2023 1049   MCH 31.2 12/28/2022 0417   MCHC 32.7 07/30/2023 1049   RDW 15.2 07/30/2023 1049   LYMPHSABS 2.6 07/30/2023 1049   MONOABS 0.5 07/30/2023 1049   EOSABS 0.1 07/30/2023 1049   BASOSABS 0.1 07/30/2023 1049     CMP     Component Value Date/Time   NA 134 (L) 07/30/2023 1049   K 2.9 (L) 07/30/2023 1049   CL 99 07/30/2023 1049   CO2 25 07/30/2023 1049   GLUCOSE 89 07/30/2023 1049   BUN 12 07/30/2023 1049   CREATININE 1.18 07/30/2023 1049   CREATININE 1.39 (H) 03/03/2022 0823   CALCIUM 10.3 07/30/2023  1049   PROT 8.6 (H) 07/30/2023 1049   ALBUMIN 4.7 07/30/2023 1049   AST 20 07/30/2023 1049   AST 17 03/03/2022 0823   ALT 10 07/30/2023 1049   ALT 13 03/03/2022 0823   ALKPHOS 80 07/30/2023 1049   BILITOT 0.4 07/30/2023 1049   BILITOT 0.3 03/03/2022 0823   GFRNONAA >60 01/01/2023 0648   GFRNONAA 46 (L) 03/03/2022 0823   GFRAA >60 11/14/2015 0533     ASSESSMENT & PLAN:  PLAN:  No orders of the defined types were placed in this encounter.     All questions were answered. The patient knows to call the clinic  with any problems, questions or concerns. No barriers to learning were detected. I spent *** counseling the patient face to face. The total time spent in the appointment was *** and more than 50% was on counseling, review of test results, and coordination of care.   Livianna Petraglia, NP-C @DATE @

## 2023-08-30 ENCOUNTER — Telehealth: Payer: Self-pay

## 2023-08-30 ENCOUNTER — Inpatient Hospital Stay: Payer: Commercial Managed Care - PPO | Attending: Nurse Practitioner

## 2023-08-30 ENCOUNTER — Inpatient Hospital Stay: Payer: Commercial Managed Care - PPO | Admitting: Nurse Practitioner

## 2023-08-30 NOTE — Telephone Encounter (Signed)
 Called patient due to not showing up for 1030 lab app. Was unable to contact patient left a message on VM. Will no show patient for app today.

## 2023-09-11 ENCOUNTER — Ambulatory Visit: Payer: Commercial Managed Care - PPO | Admitting: Internal Medicine

## 2023-09-26 ENCOUNTER — Ambulatory Visit: Payer: Commercial Managed Care - PPO | Admitting: Internal Medicine

## 2023-10-08 NOTE — Progress Notes (Signed)
BH MD/PA/NP OP Progress Note  10/11/2023 10:56 AM Emily Park  MRN:  [id number]  Visit Diagnosis:    ICD-10-CM   1. Generalized anxiety disorder  F41.1 eszopiclone (LUNESTA) 1 MG TABS tablet    sertraline (ZOLOFT) 100 MG tablet    prazosin (MINIPRESS) 1 MG capsule    gabapentin (NEURONTIN) 300 MG capsule    2. 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 Migraine without status migrainosus, not intractable, unspecified migraine type  G43.909       Assessment: HARMONY [name] is a 53 y.o. female with a history of MDD with suicide attempt and subsequent hospitalization in May 2024, GAD, reported ADHD, and IBS who presented to [facility] for initial evaluation on 03/27/2023.  At initial evaluation patient reported struggling with neurovegetative symptoms of depression including low mood, anhedonia, amotivation, disturbed sleep, hopelessness, feelings of worthlessness, and negative self thoughts.  She denied any SI/HI or thoughts of self-harm though had acted on suicidal thoughts in May 2024.  Crisis resources were reviewed and patient has completed safety planning.  Medications are managed by her husband and she is able to list a number of supports she can reach out to if needed.  Patient also struggled with symptoms of anxiety including excessive worry, fear of something awful happening, restlessness, palpitations, chest pressure, and shortness of breath.  At times the anxiety can progress to panic episodes.  Patient does have a past trauma history from which she experiences nightmares and hypervigilance though she denies any other symptoms consistent with PTSD.  Also of note she has been prescribed high-dose benzodiazepines for over a decade and is in the process of tapering off of them.  Psychosocially patient has good supports in husband,  family, and friends.  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  There is increased financial stressors at time of initial evaluation.  Patient met criteria for MDD and GAD.  She did have a former diagnosis of ADHD this could be complicated by her other underlying psychiatric disorders and further neurological testing would be appropriate.  Ritu Gagliardo Park presents for follow-up evaluation. Today, 10/11/23, patient reports stable mood symptoms with some ongoing depression.  She endorsed low mood, decreased appetite, difficulty sleeping, and negative self thoughts.  She denied any SI/HI or thoughts of self-harm.  Medications are still controlled by her husband.  Current depressive symptoms does appear to be secondary to ongoing psychosocial stressors with her husband's cancer treatment.  Patient did try Seroquel in the interim but found the medication to be over sedating and discontinued it.  She has tolerated the increase in gabapentin well and notes a benefit in anxiety management.  We will discontinue Seroquel today and start Lunesta for insomnia.  Risk and benefits of this medication were reviewed.  Patient will follow up in 6 weeks.  Plan:  - Continue Zoloft 150 mg daily - Continue Gabapentin 300 mg TID prn for anxiety - Continue Prazosin 1 mg QHS - Continue Topamax 100 mg in the morning for migraines - Start Lunesta 1 mg - Discontinue Seroquel 50 mg QHS - Attending a group for grief counseling once a week which is 13 weeks long - Will restart with grief counseling when able - Completed PHP and part of IOP -  Neuropsysch testing referral - Crisis resources reviewed - Follow up in 6 weeks  Chief Complaint:  Chief Complaint  Patient presents with   Follow-up   HPI:  Emily Park presents reporting that things have been ok the past couple months.  She has still been struggling with the depressive symptoms including poor sleep (4-5 hours a night),  decreased appetite with weight loss (currently weighs 110 pounds), low self-worth, and lack of confidence.  She denies any SI/HI or thoughts of self-harm.  Patient reports that the primary stressor at this point is her husband's cancer and ongoing treatment.  There also has been another death in the family with her stepfather passing away.  Patient notes that while difficult this is affecting her mother more so than herself.  In regards to her husband Emily Park is trying her best to be there for him though notes that he has been getting more irritable and depressed lately.  When this happens she tries to give him space.  She did mention that he has been talking to a counselor about things which she thinks has been beneficial.  Her husband has an upcoming surgery which leads led to some anxiety about though thinks this is within normal levels.  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  Patient is looking forward to returning to work as she believes it will improve her self-worth and confidence.  In addition constantly staying at the house has left her feeling more depressed so the idea of being able to get out and do things will be beneficial.  Destenie notes that she did try the Seroquel however, that left her feeling over sedated and almost gave a sense of hallucinating following day.  Thus she discontinued the medication.  She does take the increased gabapentin dose and finds it helpful for her anxiety particularly winding down around bedtime.  As sleep is still an issue we did discuss a few options.  Patient sister takes Alfonso Patten with benefit and we discussed the risk and benefits of this medication.  Of note husband still controls her medications with them being locked up in a safe when he is not around.  In addition Jazmon denies any SI or thoughts of self-harm.  We agreed to start Lunesta 1 mg at bedtime patient will reach out of any  concerns.  Past Psychiatric History: Patient was admitted to [facility] in 12/2022.  After which she stepped down to PHP on 01/16/2023 followed by IOP on 02/08/2023.  Patient had been admitted to the hospital following a suicide attempt by insulin overdose.  Prior to this she had no other suicide attempts or episodes of self-harm. OPT: Dr. Evelene Croon 10+ years Therapist: [facility] 2x, but no insurance coverage, appointment scheduled with [facility] Previous: Klonopin 1mg  QHS and doxepin 75mg  QHS, patient had been on Klonopin 10 years, Abilify, Prozac (worked then pooped out after 20+ years), Trintellix, doxepin (poor benefit),  Xanax (too strong), Wellbutrin ( failed), BuSar (ineffective), clonidine, mirtazapine, Atarax (ineffective), Ambien ( behavior changes, spending money suddenly). Trazodone did not help with sleep. 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  Patient is being tapered off of Klonopin now.  She denies any current ethanol use though had in the past.  Patient discontinued due to developing stomach ulcers.  She denies any THC, tobacco, or other  substance use.  Past Medical History:  Past Medical History:  Diagnosis Date   Abnormal TSH    ADD (attention deficit disorder)    Anemia    Anxiety    Arrhythmia    Decreased libido    Depression    Heart murmur    Hyperlipidemia    Hypokalemia    Migraine    Nephrolithiasis    Pap smear abnormality of cervix/human papillomavirus (HPV) positive    Sleep difficulties    SVT (supraventricular tachycardia) (HCC)     Past Surgical History:  Procedure Laterality Date   ABLATION     Cardiac ablation, endometrial ablation   BREAST LUMPECTOMY     right breast   BREAST LUMPECTOMY Right    CARDIOVERSION     COLONOSCOPY WITH PROPOFOL N/A 05/16/2021   Procedure: COLONOSCOPY WITH PROPOFOL;  Surgeon: Charlott Rakes, MD;  Location: [facility]  ENDOSCOPY;  Service: Endoscopy;  Laterality: N/A;   COLPOSCOPY     w/ cervical biopsy   DILITATION & CURRETTAGE/HYSTROSCOPY WITH NOVASURE ABLATION N/A 09/11/2014   Procedure: DILATATION & CURETTAGE/HYSTEROSCOPY WITH NOVASURE ABLATION;  Surgeon: Lenoard Aden, MD;  Location: [facility] ORS;  Service: Gynecology;  Laterality: N/A;   GIVENS CAPSULE STUDY N/A 05/16/2021   Procedure: GIVENS CAPSULE STUDY;  Surgeon: Charlott Rakes, MD;  Location: [facility] ENDOSCOPY;  Service: Endoscopy;  Laterality: N/A;   XI ROBOT ASSISTED RECTOPEXY N/A 07/07/2021   Procedure: XI ROBOT ASSISTED RECTOPEXY;  Surgeon: Karie Soda, MD;  Location: [facility] ORS;  Service: General;  Laterality: N/A;   XI ROBOTIC ASSISTED LOWER ANTERIOR RESECTION N/A 07/07/2021   Procedure: XI ROBOTIC ASSISTED LOW ANTERIOR RECTOSIGMOID RESECTION WITH RIGID PROCTOSCOPY, TAP BLOCK;  Surgeon: Karie Soda, MD;  Location: [facility] ORS;  Service: General;  Laterality: N/A;   Family History:  Family History  Problem Relation Age of Onset   Hypothyroidism Mother    Depression Mother    Cancer Mother        thyroid   Thyroid disease Mother    Alcohol abuse Father    Hypertension Father    Heart attack Father        Age 68   AAA (abdominal aortic aneurysm) Father    Depression Sister    Hypothyroidism Sister    Hyperthyroidism Sister    Diabetes Maternal Grandfather    Depression Maternal Grandmother    Breast cancer Paternal Grandmother    Depression Cousin     Social History:  Social History   Socioeconomic History   Marital status: Married    Spouse name: Not on file   Number of children: 2   Years of education: college   Highest education level: Associate degree: academic program  Occupational History   Occupation: Charity fundraiser with Engineer, manufacturing systems: [employer]   Tobacco Use   Smoking status: Never    Passive exposure: Current   Smokeless tobacco: Never  Vaping Use   Vaping status: Never Used  Substance and Sexual Activity   Alcohol use: Not  Currently    Alcohol/week: 2.0 standard drinks of alcohol    Types: 2 Glasses of wine per week   Drug use: Not Currently   Sexual activity: Yes    Partners: Male    Comment: Married  Other Topics Concern   Not on file  Social History Narrative   Lives at home with two sons.   Right-handed.   60 ounces of tea and soda per day.   Social Drivers of Health  Financial Resource Strain: Low Risk  (10/16/2018)   Received from [facility] visits prior to 10/28/2022., [facility] visits prior to 10/28/2022.   Overall Financial Resource Strain (CARDIA)    Difficulty of Paying Living Expenses: Not hard at all  Food Insecurity: Food Insecurity Present (12/28/2022)   Hunger Vital Sign    Worried About Running Out of Food in the Last Year: Sometimes true    Ran Out of Food in the Last Year: Sometimes true  Transportation Needs: No Transportation Needs (12/28/2022)   PRAPARE - Administrator, Civil Service (Medical): No    Lack of Transportation (Non-Medical): No  Physical Activity: Not on file  Stress: Not on file  Social Connections: Unknown (01/12/2022)   [facility] from [facility]   Social Network    Social Network: Not on file    Allergies:  Allergies  Allergen Reactions   Augmentin [Amoxicillin-Pot Clavulanate]     Stomach pain   Zithromax [Azithromycin] Rash    Current Medications: Current Outpatient Medications  Medication Sig Dispense Refill   eszopiclone (LUNESTA) 1 MG TABS tablet Take 1 tablet (1 mg total) by mouth at bedtime as needed for sleep. Take immediately before bedtime 30 tablet 1   ACCRUFER 30 MG CAPS Take 1 capsule (30 mg total) by mouth 2 (two) times daily. 90 capsule 0   aspirin EC 81 MG tablet Take 1 tablet (81 mg total) by mouth daily.     Cholecalciferol (VITAMIN D3) 50 MCG (2000 UT) capsule Take 2,000 Units by mouth daily.     gabapentin (NEURONTIN) 300 MG capsule Take 1 capsule (300 mg total)  by mouth 3 (three) times daily as needed. 270 capsule 0   HYDROcodone-acetaminophen (NORCO/VICODIN) 5-325 MG tablet Take 1 tablet by mouth every 6 (six) hours as needed for severe pain (pain score 7-10). 20 tablet 0   hydrocortisone (ANUSOL-HC) 2.5 % rectal cream Apply 1 Application topically 4 (four) times daily as needed for hemorrhoids. 30 g 0   linaclotide (LINZESS) 145 MCG CAPS capsule Take 1 capsule (145 mcg total) by mouth daily before breakfast. 30 capsule 0   LYLLANA 0.05 MG/24HR patch Place 1 patch onto the skin.     Multiple Vitamin (MULTIVITAMIN WITH MINERALS) TABS tablet Take 1 tablet by mouth every morning.     pantoprazole (PROTONIX) 40 MG tablet Take 1 tablet (40 mg total) by mouth 2 (two) times daily. 180 tablet 3   polyethylene glycol (MIRALAX / GLYCOLAX) 17 g packet Take 17 g by mouth 2 (two) times daily. (Patient taking differently: Take 17 g by mouth daily as needed for moderate constipation.) 14 each 0   polyvinyl alcohol (LIQUIFILM TEARS) 1.4 % ophthalmic solution Place 1 drop into both eyes as needed for dry eyes. 15 mL 0   potassium chloride SA (KLOR-CON M20) 20 MEQ tablet Take 1 tablet (20 mEq total) by mouth 2 (two) times daily. 180 tablet 1   prazosin (MINIPRESS) 1 MG capsule Take 1 capsule (1 mg total) by mouth at bedtime. 90 capsule 0   progesterone (PROMETRIUM) 100 MG capsule Take 100 mg by mouth at bedtime.     rizatriptan (MAXALT) 10 MG tablet PLEASE SEE ATTACHED FOR DETAILED DIRECTIONS     sertraline (ZOLOFT) 100 MG tablet Take 1.5 tablets (150 mg total) by mouth daily. 135 tablet 0   Sodium Fluoride (PREVIDENT 5000 BOOSTER PLUS) 1.1 % PSTE As directed tid 100 mL 3  topiramate (TOPAMAX) 100 MG tablet Take 1 tablet (100 mg total) by mouth daily. 90 tablet 1   XIFAXAN 550 MG TABS tablet Take 550 mg by mouth 3 (three) times daily.     No current facility-administered medications for this visit.     Psychiatric Specialty Exam: Review of Systems  There were no  vitals taken for this visit.There is no height or weight on file to calculate BMI.  General Appearance: Fairly Groomed  Eye Contact:  Fair  Speech:  Clear and Coherent  Volume:  Normal  Mood:  Euthymic  Affect:  Congruent  Thought Process:  Coherent  Orientation:  Full (Time, Place, and Person)  Thought Content: Logical   Suicidal Thoughts:  No  Homicidal Thoughts:  No  Memory:  Immediate;   Fair  Judgement:  Fair  Insight:  Fair  Psychomotor Activity:  Decreased  Concentration:  Concentration: Fair  Recall:  Fair  Fund of Knowledge: Fair  Language: Good  Akathisia:  No    AIMS (if indicated): not done  Assets:  Communication Skills Desire for Improvement Financial Resources/Insurance Housing Social Support  ADL's:  Intact  Cognition: WNL  Sleep:  Fair   Metabolic Disorder Labs: Lab Results  Component Value Date   HGBA1C 4.9 12/20/2022   MPG 93.93 12/20/2022   MPG 93.93 07/06/2021   No results found for: "PROLACTIN" Lab Results  Component Value Date   CHOL 208 (H) 02/24/2014   TRIG 81.0 02/24/2014   HDL 54.90 02/24/2014   CHOLHDL 4 02/24/2014   VLDL 16.2 02/24/2014   LDLCALC 137 (H) 02/24/2014   Lab Results  Component Value Date   TSH 0.18 (L) 03/20/2023   TSH 0.175 (L) 12/21/2022    Therapeutic Level Labs: No results found for: "LITHIUM" No results found for: "VALPROATE" No results found for: "CBMZ"   Screenings: AIMS    Flowsheet Row [facility]  AIMS Total Score 0      AUDIT    Flowsheet Row Ad[facility] ([facility]  Alcohol Use Disorder Identification Test Final Score (AUDIT) 1      GAD-7    Flowsheet Row Office Visit from 03/20/2023 in [facility] [place] Counselor from 01/09/2023 in BEHAVIORAL HEALTH PARTIAL HOSPITALIZATION PROGRAM Office Visit from 11/22/2022 in [facility]  Total GAD-7 Score 7 15 12       PHQ2-9    Flowsheet Row Office Visit from 07/30/2023 in [facility] [place] Counselor from 06/14/2023 in BEHAVIORAL HEALTH INTENSIVE PSYCH Counselor from 06/08/2023 in BE[facility]IOR[facility] PARTIAL HOSPITALIZATION PROGRAM Counselor from 06/01/2023 in BEHAVIORAL HEALTH PARTIAL HOSPITALIZATION PROGRAM Counselor from 05/03/2023 in BEHAVIORAL HEALTH PARTIAL HOSPITALIZATION PROGRAM  PHQ-2 Total Score 0 2 2 2 4   PHQ-9 Total Score -- 9 6 17 14       Flowsheet Row Counselor from 06/14/2023 in BEHAVIORAL HEALTH INTENSIVE PSYCH Counselor from 06/01/2023 in BEHAVIORAL HEALTH PARTIAL HOSPITALIZATION PROGRAM Counselor from 05/03/2023 in BEHAVIORAL HEALTH PARTIAL HOSPITALIZATION PROGRAM  C-SSRS RISK CATEGORY Error: Question 6 not populated No Risk Error: Question 2 not populated       Collaboration of Care: Collaboration of Care: Medication Management AEB medication prescription  Patient/Guardian was advised Release of Information must be obtained prior to any record release in order to collaborate their care with an outside provider. Patient/Guardian was advised if they have not already done  so to contact the registration department to sign all necessary forms in order for [name] to release information regarding their care.   Consent: Patient/Guardian gives verbal consent for treatment and assignment of benefits for services provided during this visit. Patient/Guardian expressed understanding and agreed to proceed.    Stasia Cavalier, MD 10/11/2023, 10:56 AM   Virtual Visit via Video Note  I connected with Evadne Worm wood on 10/11/23 at 10:30 AM EST by a video enabled telemedicine application and verified that I am speaking with the correct person using two identifiers.  Location: Patient: Home Provider: Home Office   I discussed the limitations of evaluation and management by telemedicine and the availability of in  person appointments. The patient expressed understanding and agreed to proceed.   I discussed the assessment and treatment plan with the patient. The patient was provided an opportunity to ask questions and all were answered. The patient agreed with the plan and demonstrated an understanding of the instructions.   The patient was advised to call back or seek an in-person evaluation if the symptoms worsen or if the condition fails to improve as anticipated.  I provided 20 minutes of non-face-to-face time during this encounter.   Stasia Cavalier, MD

## 2023-10-11 ENCOUNTER — Encounter (HOSPITAL_COMMUNITY): Payer: Self-pay | Admitting: Psychiatry

## 2023-10-11 ENCOUNTER — Telehealth (HOSPITAL_BASED_OUTPATIENT_CLINIC_OR_DEPARTMENT_OTHER): Payer: Commercial Managed Care - PPO | Admitting: Psychiatry

## 2023-10-11 DIAGNOSIS — G43909 Migraine, unspecified, not intractable, without status migrainosus: Secondary | ICD-10-CM | POA: Diagnosis not present

## 2023-10-11 DIAGNOSIS — F332 Major depressive disorder, recurrent severe without psychotic features: Secondary | ICD-10-CM | POA: Diagnosis not present

## 2023-10-11 DIAGNOSIS — F411 Generalized anxiety disorder: Secondary | ICD-10-CM | POA: Diagnosis not present

## 2023-10-11 MED ORDER — GABAPENTIN 300 MG PO CAPS: 300.0000 mg | ORAL_CAPSULE | Freq: Three times a day (TID) | ORAL | 0 refills | Status: DC | PRN

## 2023-10-11 MED ORDER — SERTRALINE HCL 100 MG PO TABS: 150.0000 mg | ORAL_TABLET | Freq: Every day | ORAL | 0 refills | Status: DC

## 2023-10-11 MED ORDER — PRAZOSIN HCL 1 MG PO CAPS: 1.0000 mg | ORAL_CAPSULE | Freq: Every day | ORAL | 0 refills | Status: DC

## 2023-10-11 MED ORDER — ESZOPICLONE 1 MG PO TABS: 1.0000 mg | ORAL_TABLET | Freq: Every evening | ORAL | 1 refills | Status: DC | PRN

## 2023-10-15 ENCOUNTER — Other Ambulatory Visit: Payer: Self-pay | Admitting: Internal Medicine

## 2023-10-29 ENCOUNTER — Other Ambulatory Visit (HOSPITAL_COMMUNITY): Payer: Self-pay | Admitting: Psychiatry

## 2023-10-29 DIAGNOSIS — F332 Major depressive disorder, recurrent severe without psychotic features: Secondary | ICD-10-CM

## 2023-10-29 DIAGNOSIS — F411 Generalized anxiety disorder: Secondary | ICD-10-CM

## 2023-10-31 ENCOUNTER — Other Ambulatory Visit (HOSPITAL_COMMUNITY): Payer: Self-pay | Admitting: Psychiatry

## 2023-10-31 ENCOUNTER — Telehealth (HOSPITAL_COMMUNITY): Payer: Self-pay

## 2023-10-31 MED ORDER — DOXEPIN HCL 75 MG PO CAPS: 75.0000 mg | ORAL_CAPSULE | Freq: Every day | ORAL | 2 refills | Status: DC

## 2023-10-31 NOTE — Telephone Encounter (Signed)
 Patient is calling because she has not been sleeping real well, patient would like to go back on the Doxepin, she said that helped her sleep through the night. Please review and advise, thank you

## 2023-10-31 NOTE — Telephone Encounter (Signed)
 I called the patient back and left a voicemail letting her know that the medication was called into the pharmacy

## 2023-11-02 ENCOUNTER — Other Ambulatory Visit (HOSPITAL_COMMUNITY): Payer: Self-pay | Admitting: Psychiatry

## 2023-11-02 DIAGNOSIS — F332 Major depressive disorder, recurrent severe without psychotic features: Secondary | ICD-10-CM

## 2023-11-02 DIAGNOSIS — F411 Generalized anxiety disorder: Secondary | ICD-10-CM

## 2023-11-19 NOTE — Progress Notes (Unsigned)
 BH MD/PA/NP OP Progress Note  11/22/2023 12:55 PM Emily Park  MRN:  [id number]  Visit Diagnosis:    ICD-10-CM   1. Generalized anxiety disorder  F41.1 clonazePAM (KLONOPIN) 1 MG tablet    2. MDD (major depressive disorder), recurrent severe, without psychosis (HCC)  F33.2 clonazePAM (KLONOPIN) 1 MG tablet    3. Migraine without status migrainosus, not intractable, unspecified migraine type  G43.909        Assessment: Emily Park is a 53 y.o. female with a history of MDD with suicide attempt and subsequent hospitalization in May 2024, GAD, reported ADHD, and IBS who presented to [facility] for initial evaluation on 03/27/2023.  At initial evaluation patient reported struggling with neurovegetative symptoms of depression including low mood, anhedonia, amotivation, disturbed sleep, hopelessness, feelings of worthlessness, and negative self thoughts.  She denied any SI/HI or thoughts of self-harm though had acted on suicidal thoughts in May 2024.  Crisis resources were reviewed and patient has completed safety planning.  Medications are managed by her husband and she is able to list a number of supports she can reach out to if needed.  Patient also struggled with symptoms of anxiety including excessive worry, fear of something awful happening, restlessness, palpitations, chest pressure, and shortness of breath.  At times the anxiety can progress to panic episodes.  Patient does have a past trauma history from which she experiences nightmares and hypervigilance though she denies any other symptoms consistent with PTSD.  Also of note she has been prescribed high-dose benzodiazepines for over a decade and is in the process of tapering off of them.  Psychosocially patient has good supports in husband, family, and friends.  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  There is increased financial stressors at  time of initial evaluation.  Patient met criteria for MDD and GAD.  She did have a former diagnosis of ADHD this could be complicated by her other underlying psychiatric disorders and further neurological testing would be appropriate.  Emily Park presents for follow-up evaluation. Today, 11/22/23, patient reports increased anxiety in the interim with slight increase in depressive symptoms.  She denies any thoughts of self-harm or suicide.  Anxiety is related to psychosocial stressors of her husband having to go to the ICU postsurgery and the increased financial burden.  We did discontinue Lunesta in the interim and restarting doxepin for insomnia.  Patient has had improvement in insomnia initially before these events.  Recent decline is likely related to increased stress and the change in her current living arrangements which are not suited to restful sleep.  We recommended continuing on current regimen with the addition of as needed Klonopin 1 mg to be taken for this brief period of increased anxiety.  We will plan to discontinue at the next appointment.  Patient will follow up in a month.   Psychotherapeutic interventions were used during today's session. From 10:39 AM to 10:58 AM we used empathic listening techniques and provided support. Used supportive interviewing techniques to validate patients feelings. Worked on cognitive re framing techniques and focusing on behavioral activation.  Improvement was evidenced by patient's participation.  Plan:  - Continue Zoloft 150 mg daily - Continue Gabapentin 300 mg TID prn for anxiety - Continue Prazosin 1 mg QHS - Continue Topamax 100 mg in the morning for migraines - Start 30-day course of Klonopin 1 mg daily  as needed for anxiety - Discontinue Lunesta 1 mg - Start Doxepin 75 mg QHS - Attending a group for grief counseling once a week which is 13 weeks long - Will restart with grief counseling when able - Completed PHP and part of IOP -  Neuropsysch testing referral - Crisis resources reviewed - Follow up in 6 weeks  Chief Complaint:  Chief Complaint  Patient presents with   Follow-up   HPI: Patient had reached out in the interim requesting to restart Doxepin due to lack of benefit on Lunesta. This was agreed to and doxepin script was sent while Emily Park was discontinued.  Emily Park presents reporting that she is not doing well. Her husband had surgery last Monday, he had to get put on a ventilator after and is now in the ICU. After the surgery he was dependent on oxygen and there was concern for pneumonia so they opted to intubate him. Yesterday he was less sedated and more responsive to the point he was able to nod to some questions. She has been spending most of the day at the hospital before going home to a nearby family house around 7:30. The nights are tough as the neighbors there are very loud.  She did mention that prior to this the transition off Lunesta back to doxepin was beneficial for sleep.  Discussed pressure and current increased stress level and its impact on her anxiety and depression.  She notes that the depression while slightly increased has not had significant changes.  She denies any thoughts of SI or self-harm.  The anxiety has been more significant as she is feeling very overwhelmed with everything going on and the financial stressors at home.  She has been trying to stay positive by engaging in more uplifting activities such as playing music he likes, prayer, and watching church services.  Empathic listening techniques were used and support was provided.  Patient was encouraged to manage her own self-care during this time and to engage some of her supports such as family and friends.  We also suggested patient consider asking for a pastoral consult come by from the hospital to help support her current husband during this time  Past Psychiatric History: Patient was admitted to [facility] in 12/2022.  After which she stepped  down to PHP on 01/16/2023 followed by IOP on 02/08/2023.  Patient had been admitted to the hospital following a suicide attempt by insulin overdose.  Prior to this she had no other suicide attempts or episodes of self-harm. OPT: Dr. Evelene Croon 10+ years Therapist: [facility] 2x, but no insurance coverage, appointment scheduled with [facility] Previous: Klonopin 1mg  QHS and doxepin 75mg  QHS, patient had been on Klonopin 10 years, Abilify, Prozac (worked then pooped out after 20+ years), Trintellix, doxepin (poor benefit),  Xanax (too strong), Wellbutrin ( failed), BuSar (ineffective), clonidine, mirtazapine, Atarax (ineffective), Ambien ( behavior changes, spending money suddenly). Trazodone did not help with sleep. 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  Patient is being tapered off of Klonopin now.  She denies any current ethanol use though had in the past.  Patient discontinued due to developing stomach ulcers.  She denies any THC, tobacco, or other substance use.  Past Medical History:  Past Medical History:  Diagnosis Date   Abnormal TSH    ADD (attention deficit disorder)    Anemia    Anxiety    Arrhythmia  Decreased libido    Depression    Heart murmur    Hyperlipidemia    Hypokalemia    Migraine    Nephrolithiasis    Pap smear abnormality of cervix/human papillomavirus (HPV) positive    Sleep difficulties    SVT (supraventricular tachycardia) (HCC)     Past Surgical History:  Procedure Laterality Date   ABLATION     Cardiac ablation, endometrial ablation   BREAST LUMPECTOMY     right breast   BREAST LUMPECTOMY Right    CARDIOVERSION     COLONOSCOPY WITH PROPOFOL N/A 05/16/2021   Procedure: COLONOSCOPY WITH PROPOFOL;  Surgeon: Charlott Rakes, MD;  Location: [facility];  Service: Endoscopy;  Laterality: N/A;   COLPOSCOPY     w/ cervical biopsy   DILITATION &  CURRETTAGE/HYSTROSCOPY WITH NOVASURE ABLATION N/A 09/11/2014   Procedure: DILATATION & CURETTAGE/HYSTEROSCOPY WITH NOVASURE ABLATION;  Surgeon: Lenoard Aden, MD;  Location: [facility];  Service: Gynecology;  Laterality: N/A;   GIVENS CAPSULE STUDY N/A 05/16/2021   Procedure: GIVENS CAPSULE STUDY;  Surgeon: Charlott Rakes, MD;  Location: [facility];  Service: Endoscopy;  Laterality: N/A;   XI ROBOT ASSISTED RECTOPEXY N/A 07/07/2021   Procedure: XI ROBOT ASSISTED RECTOPEXY;  Surgeon: Karie Soda, MD;  Location: [facility];  Service: General;  Laterality: N/A;   XI ROBOTIC ASSISTED LOWER ANTERIOR RESECTION N/A 07/07/2021   Procedure: XI ROBOTIC ASSISTED LOW ANTERIOR RECTOSIGMOID RESECTION WITH RIGID PROCTOSCOPY, TAP BLOCK;  Surgeon: Karie Soda, MD;  Location: [facility];  Service: General;  Laterality: N/A;   Family History:  Family History  Problem Relation Age of Onset   Hypothyroidism Mother    Depression Mother    Cancer Mother        thyroid   Thyroid disease Mother    Alcohol abuse Father    Hypertension Father    Heart attack Father        Age 106   AAA (abdominal aortic aneurysm) Father    Depression Sister    Hypothyroidism Sister    Hyperthyroidism Sister    Diabetes Maternal Grandfather    Depression Maternal Grandmother    Breast cancer Paternal Grandmother    Depression Cousin     Social History:  Social History   Socioeconomic History   Marital status: Married    Spouse name: Not on file   Number of children: 2   Years of education: college   Highest education level: Associate degree: academic program  Occupational History   Occupation: Charity fundraiser with Engineer, manufacturing systems: [employer]   Tobacco Use   Smoking status: Never    Passive exposure: Current   Smokeless tobacco: Never  Vaping Use   Vaping status: Never Used  Substance and Sexual Activity   Alcohol use: Not Currently    Alcohol/week: 2.0 standard drinks of alcohol    Types: 2 Glasses of wine per week   Drug use:  Not Currently   Sexual activity: Yes    Partners: Male    Comment: Married  Other Topics Concern   Not on file  Social History Narrative   Lives at home with two sons.   Right-handed.   60 ounces of tea and soda per day.   Social Drivers of Health   Financial Resource Strain: Low Risk  (10/16/2018)   Received from [facility] visits prior to 10/28/2022., [facility] visits prior to 10/28/2022.   Overall Physicist, medical Strain (CARDIA)  Difficulty of Paying Living Expenses: Not hard at all  Food Insecurity: Food Insecurity Present (12/28/2022)   Hunger Vital Sign    Worried About Running Out of Food in the Last Year: Sometimes true    Ran Out of Food in the Last Year: Sometimes true  Transportation Needs: No Transportation Needs (12/28/2022)   PRAPARE - Administrator, Civil Service (Medical): No    Lack of Transportation (Non-Medical): No  Physical Activity: Not on file  Stress: Not on file  Social Connections: Unknown (01/12/2022)   Received from [facility]   Social Network    Social Network: Not on file    Allergies:  Allergies  Allergen Reactions   Augmentin [Amoxicillin-Pot Clavulanate]     Stomach pain   Zithromax [Azithromycin] Rash    Current Medications: Current Outpatient Medications  Medication Sig Dispense Refill   clonazePAM (KLONOPIN) 1 MG tablet Take 1 tablet (1 mg total) by mouth daily as needed for anxiety. 30 tablet 0   doxepin (SINEQUAN) 75 MG capsule Take 1 capsule (75 mg total) by mouth at bedtime. 30 capsule 2   ACCRUFER 30 MG CAPS Take 1 capsule (30 mg total) by mouth 2 (two) times daily. 90 capsule 0   aspirin EC 81 MG tablet Take 1 tablet (81 mg total) by mouth daily.     Cholecalciferol (VITAMIN D3) 50 MCG (2000 UT) capsule Take 2,000 Units by mouth daily.     gabapentin (NEURONTIN) 300 MG capsule Take 1 capsule (300 mg total) by mouth 3 (three) times daily as needed. 270  capsule 0   HYDROcodone-acetaminophen (NORCO/VICODIN) 5-325 MG tablet Take 1 tablet by mouth every 6 (six) hours as needed for severe pain (pain score 7-10). 20 tablet 0   hydrocortisone (ANUSOL-HC) 2.5 % rectal cream Apply 1 Application topically 4 (four) times daily as needed for hemorrhoids. 30 g 0   LINZESS 145 MCG CAPS capsule TAKE 1 CAPSULE (145 MCG TOTAL) BY MOUTH ONCE DAILY 90 capsule 3   LYLLANA 0.05 MG/24HR patch Place 1 patch onto the skin.     Multiple Vitamin (MULTIVITAMIN WITH MINERALS) TABS tablet Take 1 tablet by mouth every morning.     pantoprazole (PROTONIX) 40 MG tablet Take 1 tablet (40 mg total) by mouth 2 (two) times daily. 180 tablet 3   polyethylene glycol (MIRALAX / GLYCOLAX) 17 g packet Take 17 g by mouth 2 (two) times daily. (Patient taking differently: Take 17 g by mouth daily as needed for moderate constipation.) 14 each 0   polyvinyl alcohol (LIQUIFILM TEARS) 1.4 % ophthalmic solution Place 1 drop into both eyes as needed for dry eyes. 15 mL 0   potassium chloride SA (KLOR-CON M20) 20 MEQ tablet Take 1 tablet (20 mEq total) by mouth 2 (two) times daily. 180 tablet 1   prazosin (MINIPRESS) 1 MG capsule Take 1 capsule (1 mg total) by mouth at bedtime. 90 capsule 0   progesterone (PROMETRIUM) 100 MG capsule Take 100 mg by mouth at bedtime.     rizatriptan (MAXALT) 10 MG tablet PLEASE SEE ATTACHED FOR DETAILED DIRECTIONS     sertraline (ZOLOFT) 100 MG tablet Take 1.5 tablets (150 mg total) by mouth daily. 135 tablet 0   Sodium Fluoride (PREVIDENT 5000 BOOSTER PLUS) 1.1 % PSTE As directed tid 100 mL 3   topiramate (TOPAMAX) 100 MG tablet Take 1 tablet (100 mg total) by mouth daily. 90 tablet 1   XIFAXAN 550 MG TABS tablet Take 550  mg by mouth 3 (three) times daily.     No current facility-administered medications for this visit.     Psychiatric Specialty Exam: Review of Systems  There were no vitals taken for this visit.There is no height or weight on file to  calculate BMI.  General Appearance: Fairly Groomed  Eye Contact:  Fair  Speech:  Clear and Coherent  Volume:  Normal  Mood:  Anxious  Affect:  Congruent  Thought Process:  Coherent  Orientation:  Full (Time, Place, and Person)  Thought Content: Logical   Suicidal Thoughts:  No  Homicidal Thoughts:  No  Memory:  Immediate;   Fair  Judgement:  Fair  Insight:  Fair  Psychomotor Activity:  Decreased  Concentration:  Concentration: Fair  Recall:  Fair  Fund of Knowledge: Fair  Language: Good  Akathisia:  No    AIMS (if indicated): not done  Assets:  Communication Skills Desire for Improvement Financial Resources/Insurance Housing Social Support  ADL's:  Intact  Cognition: WNL  Sleep:  Fair   Metabolic Disorder Labs: Lab Results  Component Value Date   HGBA1C 4.9 12/20/2022   MPG 93.93 12/20/2022   MPG 93.93 07/06/2021   No results found for: "PROLACTIN" Lab Results  Component Value Date   CHOL 208 (H) 02/24/2014   TRIG 81.0 02/24/2014   HDL 54.90 02/24/2014   CHOLHDL 4 02/24/2014   VLDL 16.2 02/24/2014   LDLCALC 137 (H) 02/24/2014   Lab Results  Component Value Date   TSH 0.18 (L) 03/20/2023   TSH 0.175 (L) 12/21/2022    Therapeutic Level Labs: No results found for: "LITHIUM" No results found for: "VALPROATE" No results found for: "CBMZ"   Screenings: AIMS    Flowsheet Row [facility]  AIMS Total Score 0      AUDIT    Flowsheet Row Ad[facility] ([facility]  Alcohol Use Disorder Identification Test Final Score (AUDIT) 1      GAD-7    Flowsheet Row Office Visit from 03/20/2023 in [facility] [place] Counselor from 01/09/2023 in BEHAVIORAL HEALTH PARTIAL HOSPITALIZATION PROGRAM Office Visit from 11/22/2022 in [facility]  Total GAD-7 Score 7 15 12        PHQ2-9    Flowsheet Row Office Visit from 07/30/2023 in [facility] [place] Counselor from 06/14/2023 in BEHAVIORAL HEALTH INTENSIVE PSYCH Counselor from 06/08/2023 in BEHAVIORAL [facility]ALTH PARTIAL HOSPITALIZATION PROGRAM Counselor from 06/01/2023 in BEHAVIORAL HEALTH PARTIAL HOSPITALIZATION PROGRAM Counselor from 05/03/2023 in BEHAVIORAL HEALTH PARTIAL HOSPITALIZATION PROGRAM  PHQ-2 Total Score 0 2 2 2 4   PHQ-9 Total Score -- 9 6 17 14       Flowsheet Row Counselor from 06/14/2023 in BEHAVIORAL HEALTH INTENSIVE PSYCH Counselor from 06/01/2023 in BEHAVIORAL HEALTH PARTIAL HOSPITALIZATION PROGRAM Counselor from 05/03/2023 in BEHAVIORAL HEALTH PARTIAL HOSPITALIZATION PROGRAM  C-SSRS RISK CATEGORY Error: Question 6 not populated No Risk Error: Question 2 not populated       Collaboration of Care: Collaboration of Care: Medication Management AEB medication prescription  Patient/Guardian was advised Release of Information must be obtained prior to any record release in order to collaborate their care with an outside provider. Patient/Guardian was advised if they have not already done so to contact the registration department to sign all necessary forms in order for [name] to release information regarding their care.   Consent: Patient/Guardian gives  verbal consent for treatment and assignment of benefits for services provided during this visit. Patient/Guardian expressed understanding and agreed to proceed.    Stasia Cavalier, MD 11/22/2023, 12:55 PM   Virtual Visit via Video Note  I connected with Emily Park on 11/22/23 at 10:30 AM EDT by a video enabled telemedicine application and verified that I am speaking with the correct person using two identifiers.  Location: Patient: Home Provider: Home Office   I discussed the limitations of evaluation and management by telemedicine and the availability of in person appointments. The patient expressed understanding and agreed  to proceed.   I discussed the assessment and treatment plan with the patient. The patient was provided an opportunity to ask questions and all were answered. The patient agreed with the plan and demonstrated an understanding of the instructions.   The patient was advised to call back or seek an in-person evaluation if the symptoms worsen or if the condition fails to improve as anticipated.  I provided 20 minutes of non-face-to-face time during this encounter.   Stasia Cavalier, MD

## 2023-11-22 ENCOUNTER — Other Ambulatory Visit (HOSPITAL_COMMUNITY): Payer: Self-pay | Admitting: Psychiatry

## 2023-11-22 ENCOUNTER — Telehealth (HOSPITAL_COMMUNITY): Payer: Commercial Managed Care - PPO | Admitting: Psychiatry

## 2023-11-22 DIAGNOSIS — G43909 Migraine, unspecified, not intractable, without status migrainosus: Secondary | ICD-10-CM

## 2023-11-22 DIAGNOSIS — F411 Generalized anxiety disorder: Secondary | ICD-10-CM

## 2023-11-22 DIAGNOSIS — F332 Major depressive disorder, recurrent severe without psychotic features: Secondary | ICD-10-CM

## 2023-11-22 MED ORDER — CLONAZEPAM 1 MG PO TABS: 1.0000 mg | ORAL_TABLET | Freq: Every day | ORAL | 0 refills | Status: DC | PRN

## 2023-11-23 ENCOUNTER — Encounter (HOSPITAL_COMMUNITY): Payer: Self-pay | Admitting: Psychiatry

## 2023-11-26 ENCOUNTER — Other Ambulatory Visit (HOSPITAL_COMMUNITY): Payer: Self-pay | Admitting: *Deleted

## 2023-11-26 DIAGNOSIS — G43909 Migraine, unspecified, not intractable, without status migrainosus: Secondary | ICD-10-CM

## 2023-11-26 MED ORDER — TOPIRAMATE 100 MG PO TABS: 100.0000 mg | ORAL_TABLET | Freq: Every day | ORAL | 0 refills | Status: DC

## 2023-11-26 MED ORDER — DOXEPIN HCL 75 MG PO CAPS: 75.0000 mg | ORAL_CAPSULE | Freq: Every day | ORAL | 0 refills | Status: DC

## 2023-12-17 NOTE — Progress Notes (Unsigned)
 BH MD/PA/NP OP Progress Note  12/20/2023 1:59 PM Emily Park  MRN:  [id number]  Visit Diagnosis:    ICD-10-CM   1. MDD (major depressive disorder), recurrent severe, without psychosis (HCC)  F33.2 sertraline  (ZOLOFT ) 100 MG tablet    prazosin  (MINIPRESS ) 1 MG capsule    clonazePAM  (KLONOPIN ) 1 MG tablet    2. Migraine without status migrainosus, not intractable, unspecified migraine type  G43.909     3. Generalized anxiety disorder  F41.1 sertraline  (ZOLOFT ) 100 MG tablet    prazosin  (MINIPRESS ) 1 MG capsule    clonazePAM  (KLONOPIN ) 1 MG tablet    gabapentin  (NEURONTIN ) 300 MG capsule    clonazePAM  (KLONOPIN ) 0.5 MG tablet        Assessment: Emily Park is a 53 y.o. female with a history of MDD with suicide attempt and subsequent hospitalization in May 2024, GAD, reported ADHD, and IBS who presented to [facility] at [facility] for initial evaluation on 03/27/2023.  At initial evaluation patient reported struggling with neurovegetative symptoms of depression including low mood, anhedonia, amotivation, disturbed sleep, hopelessness, feelings of worthlessness, and negative self thoughts.  She denied any SI/HI or thoughts of self-harm though had acted on suicidal thoughts in May 2024.  Crisis resources were reviewed and patient has completed safety planning.  Medications are managed by her husband and she is able to list a number of supports she can reach out to if needed.  Patient also struggled with symptoms of anxiety including excessive worry, fear of something awful happening, restlessness, palpitations, chest pressure, and shortness of breath.  At times the anxiety can progress to panic episodes.  Patient does have a past trauma history from which she experiences nightmares and hypervigilance though she denies any other symptoms consistent with PTSD.  Also of note she has been prescribed high-dose benzodiazepines for over a decade and is in the process of  tapering off of them.  Psychosocially patient has good supports in husband, family, and friends.  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  There is increased financial stressors at time of initial evaluation.  Patient met criteria for MDD and GAD.  She did have a former diagnosis of ADHD this could be complicated by her other underlying psychiatric disorders and further neurological testing would be appropriate.  Emily Park presents for follow-up evaluation. Today, 12/20/23, patient reports that her anxiety has improved slightly in the interim.  This is in conjunction with her husbands health improving allowing him to get out of the ICU and return home.  There have been stressors related to this still however as he is on a feeding tube and increased care.  Patient has noted benefit from the initiation of Klonopin  and is sleeping better at night.  We discussed the risks of continuing on this medication long-term which patient acknowledged.  Agreed to continue on a 1 mg dose for 30 more days before decreasing to 0.5 mg daily as needed.  We will continue on the remainder of her current medication regimen and follow up in a month.   Psychotherapeutic interventions were used during today's session. From 1:33 PM to 1:39 PM. Therapeutic interventions included empathic listening, supportive therapy, cognitive and behavioral therapy, motivational interviewing. Used supportive interviewing techniques to provide emotional validation. Worked on cognitive reframing techniques and recommendations made for behavioral activation.  Improvement was evidenced by patient's participation and identified commitment to  therapy goals.   Plan:  - Continue Zoloft  150 mg daily - Continue Gabapentin  300 mg TID prn for anxiety - Continue Prazosin  1 mg QHS - Continue Topamax  100 mg in the morning for migraines - Continue  Klonopin  1 mg daily as needed for anxiety, for 30  days before decreasing the Klonopin  0.5 mg daily as needed for anxiety. - Continue Doxepin  75 mg QHS - Attending a group for grief counseling once a week which is 13 weeks long - Will restart with grief counseling when able - Completed PHP and part of IOP - Neuropsysch testing referral - Crisis resources reviewed - Follow up in 6 weeks  Chief Complaint:  Chief Complaint  Patient presents with   Follow-up   HPI: Emily Park presents reporting that she is doing ok, her husband is getting better and is back home now.  Since returning home Emily Park notes that she has mainly been focusing on catching up with chores around the house and trying to care for her husband.  While he has certainly improved there is still a while to go in his recovery.  He is on a feeding tube now and scheduled for a repeat scan in the next week or 2.  That will determine whether he needs to undergo further chemotherapy or not.  The anxiety symptoms have improved some since they have return home though she still feels that they are heightened overall.  She can find herself getting overwhelmed with the number of tasks that need to be completed with it reaching the point of a panic episode last week.  We did work on how to identify increased anxiety and panic onset.  Discussed some grounding techniques to help manage the increased anxiety and prevent progression to panic.  Patient has been able to identify some of the triggers and will work on breaking tasks up into smaller achievable segments.  She also did identify an area of frustration being her 46 year old son.  Notably he lives with them but does not contribute financially or to the chores of the household.  There has been no change in this despite her husband recently having surgery and needing extra care.  He does work consistently which makes it more frustrating as his money does not go to help them at all.  Medication wise patient reports improvement of anxiety symptoms with the  addition of Klonopin .  Notably she is sleeping better and reports difficulty sleeping on the day she does not take the Klonopin .  She would like to continue on the medication further due to the ongoing stress of caring for her husband.  We discussed the risk of longer term Klonopin  use.  We ultimately agreed to continue for another 30 days before decreasing dose down to 0.5 mg.  Patient was agreeable to this.  She reports the remainder of her medication regimen is working well.  Past Psychiatric History: Patient was admitted to [facility] in 12/2022.  After which she stepped down to PHP on 01/16/2023 followed by IOP on 02/08/2023.  Patient had been admitted to the hospital following a suicide attempt by insulin  overdose.  Prior to this she had no other suicide attempts or episodes of self-harm. OPT: Dr. Deborra Falter 10+ years Therapist: [facility] 2x, but no insurance coverage, appointment scheduled with Bettyjane Brunet Previous: Klonopin  1mg  QHS and doxepin  75mg  QHS, patient had been on Klonopin  10 years, Abilify, Prozac  (worked then pooped out after 20+ years), Trintellix , doxepin  (poor benefit),  Xanax  (too strong), Wellbutrin ( failed),  BuSar (ineffective), clonidine , mirtazapine , Atarax  (ineffective), Ambien ( behavior changes, spending money suddenly). Trazodone did not help with sleep. 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  Patient is being tapered off of Klonopin  now.  She denies any current ethanol use though had in the past.  Patient discontinued due to developing stomach ulcers.  She denies any THC, tobacco, or other substance use.  Past Medical History:  Past Medical History:  Diagnosis Date   Abnormal TSH    ADD (attention deficit disorder)    Anemia    Anxiety    Arrhythmia    Decreased libido    Depression    Heart murmur    Hyperlipidemia    Hypokalemia    Migraine    Nephrolithiasis     Pap smear abnormality of cervix/human papillomavirus (HPV) positive    Sleep difficulties    SVT (supraventricular tachycardia) (HCC)     Past Surgical History:  Procedure Laterality Date   ABLATION     Cardiac ablation, endometrial ablation   BREAST LUMPECTOMY     right breast   BREAST LUMPECTOMY Right    CARDIOVERSION     COLONOSCOPY WITH PROPOFOL  N/A 05/16/2021   Procedure: COLONOSCOPY WITH PROPOFOL ;  Surgeon: Baldo Bonds, MD;  Location: [facility];  Service: Endoscopy;  Laterality: N/A;   COLPOSCOPY     w/ cervical biopsy   DILITATION & CURRETTAGE/HYSTROSCOPY WITH NOVASURE ABLATION N/A 09/11/2014   Procedure: DILATATION & CURETTAGE/HYSTEROSCOPY WITH NOVASURE ABLATION;  Surgeon: Camillo Celestine, MD;  Location: [facility];  Service: Gynecology;  Laterality: N/A;   GIVENS CAPSULE STUDY N/A 05/16/2021   Procedure: GIVENS CAPSULE STUDY;  Surgeon: Baldo Bonds, MD;  Location: [facility];  Service: Endoscopy;  Laterality: N/A;   XI ROBOT ASSISTED RECTOPEXY N/A 07/07/2021   Procedure: XI ROBOT ASSISTED RECTOPEXY;  Surgeon: Candyce Champagne, MD;  Location: [facility];  Service: General;  Laterality: N/A;   XI ROBOTIC ASSISTED LOWER ANTERIOR RESECTION N/A 07/07/2021   Procedure: XI ROBOTIC ASSISTED LOW ANTERIOR RECTOSIGMOID RESECTION WITH RIGID PROCTOSCOPY, TAP BLOCK;  Surgeon: Candyce Champagne, MD;  Location: [facility];  Service: General;  Laterality: N/A;   Family History:  Family History  Problem Relation Age of Onset   Hypothyroidism Mother    Depression Mother    Cancer Mother        thyroid    Thyroid  disease Mother    Alcohol  abuse Father    Hypertension Father    Heart attack Father        Age 14   AAA (abdominal aortic aneurysm) Father    Depression Sister    Hypothyroidism Sister    Hyperthyroidism Sister    Diabetes Maternal Grandfather    Depression Maternal Grandmother    Breast cancer Paternal Grandmother    Depression Cousin     Social History:  Social History    Socioeconomic History   Marital status: Married    Spouse name: Not on file   Number of children: 2   Years of education: college   Highest education level: Associate degree: academic program  Occupational History   Occupation: Charity fundraiser with Engineer, manufacturing systems: [employer]   Tobacco Use   Smoking status: Never    Passive exposure: Current   Smokeless tobacco: Never  Vaping Use   Vaping status: Never Used  Substance and Sexual Activity   Alcohol  use: Not Currently  Alcohol /week: 2.0 standard drinks of alcohol     Types: 2 Glasses of wine per week   Drug use: Not Currently   Sexual activity: Yes    Partners: Male    Comment: Married  Other Topics Concern   Not on file  Social History Narrative   Lives at home with two sons.   Right-handed.   60 ounces of tea and soda per day.   Social Drivers of Health   Financial Resource Strain: Low Risk  (10/16/2018)   Received from [facility] visits prior to 10/28/2022., [facility] visits prior to 10/28/2022.   Overall Financial Resource Strain (CARDIA)    Difficulty of Paying Living Expenses: Not hard at all  Food Insecurity: Food Insecurity Present (12/28/2022)   Hunger Vital Sign    Worried About Running Out of Food in the Last Year: Sometimes true    Ran Out of Food in the Last Year: Sometimes true  Transportation Needs: No Transportation Needs (12/28/2022)   PRAPARE - Administrator, Civil Service (Medical): No    Lack of Transportation (Non-Medical): No  Physical Activity: Not on file  Stress: Not on file  Social Connections: Unknown (01/12/2022)   Received from [facility]   Social Network    Social Network: Not on file    Allergies:  Allergies  Allergen Reactions   Augmentin  [Amoxicillin -Pot Clavulanate]     Stomach pain   Zithromax [Azithromycin] Rash    Current Medications: Current Outpatient Medications  Medication Sig Dispense Refill   [START ON  01/18/2024] clonazePAM  (KLONOPIN ) 0.5 MG tablet Take 1 tablet (0.5 mg total) by mouth daily as needed for anxiety. 30 tablet 0   ACCRUFER  30 MG CAPS Take 1 capsule (30 mg total) by mouth 2 (two) times daily. 90 capsule 0   aspirin  EC 81 MG tablet Take 1 tablet (81 mg total) by mouth daily.     Cholecalciferol (VITAMIN D3) 50 MCG (2000 UT) capsule Take 2,000 Units by mouth daily.     clonazePAM  (KLONOPIN ) 1 MG tablet Take 1 tablet (1 mg total) by mouth daily as needed for anxiety. 30 tablet 0   doxepin  (SINEQUAN ) 75 MG capsule Take 1 capsule (75 mg total) by mouth at bedtime. 90 capsule 0   gabapentin  (NEURONTIN ) 300 MG capsule Take 1 capsule (300 mg total) by mouth 3 (three) times daily as needed. 270 capsule 0   HYDROcodone -acetaminophen  (NORCO/VICODIN) 5-325 MG tablet Take 1 tablet by mouth every 6 (six) hours as needed for severe pain (pain score 7-10). 20 tablet 0   hydrocortisone  (ANUSOL -HC) 2.5 % rectal cream Apply 1 Application topically 4 (four) times daily as needed for hemorrhoids. 30 g 0   LINZESS  145 MCG CAPS capsule TAKE 1 CAPSULE (145 MCG TOTAL) BY MOUTH ONCE DAILY 90 capsule 3   LYLLANA 0.05 MG/24HR patch Place 1 patch onto the skin.     Multiple Vitamin (MULTIVITAMIN WITH MINERALS) TABS tablet Take 1 tablet by mouth every morning.     pantoprazole  (PROTONIX ) 40 MG tablet Take 1 tablet (40 mg total) by mouth 2 (two) times daily. 180 tablet 3   polyethylene glycol (MIRALAX  / GLYCOLAX ) 17 g packet Take 17 g by mouth 2 (two) times daily. (Patient taking differently: Take 17 g by mouth daily as needed for moderate constipation.) 14 each 0   polyvinyl alcohol  (LIQUIFILM TEARS) 1.4 % ophthalmic solution Place 1 drop into both eyes as needed for dry  eyes. 15 mL 0   potassium chloride  SA (KLOR-CON  M20) 20 MEQ tablet Take 1 tablet (20 mEq total) by mouth 2 (two) times daily. 180 tablet 1   prazosin  (MINIPRESS ) 1 MG capsule Take 1 capsule (1 mg total) by mouth at bedtime. 90 capsule 0    progesterone (PROMETRIUM) 100 MG capsule Take 100 mg by mouth at bedtime.     rizatriptan  (MAXALT ) 10 MG tablet PLEASE SEE ATTACHED FOR DETAILED DIRECTIONS     sertraline  (ZOLOFT ) 100 MG tablet Take 1.5 tablets (150 mg total) by mouth daily. 135 tablet 0   Sodium Fluoride  (PREVIDENT  5000 BOOSTER PLUS) 1.1 % PSTE As directed tid 100 mL 3   topiramate  (TOPAMAX ) 100 MG tablet Take 1 tablet (100 mg total) by mouth daily. 90 tablet 0   XIFAXAN 550 MG TABS tablet Take 550 mg by mouth 3 (three) times daily.     No current facility-administered medications for this visit.     Psychiatric Specialty Exam: Review of Systems  There were no vitals taken for this visit.There is no height or weight on file to calculate BMI.  General Appearance: Fairly Groomed  Eye Contact:  Fair  Speech:  Clear and Coherent  Volume:  Normal  Mood:  Anxious  Affect:  Congruent  Thought Process:  Coherent  Orientation:  Full (Time, Place, and Person)  Thought Content: Logical   Suicidal Thoughts:  No  Homicidal Thoughts:  No  Memory:  Immediate;   Fair  Judgement:  Fair  Insight:  Fair  Psychomotor Activity:  Decreased  Concentration:  Concentration: Fair  Recall:  Fair  Fund of Knowledge: Fair  Language: Good  Akathisia:  No    AIMS (if indicated): not done  Assets:  Communication Skills Desire for Improvement Financial Resources/Insurance Housing Social Support  ADL's:  Intact  Cognition: WNL  Sleep:  Fair   Metabolic Disorder Labs: Lab Results  Component Value Date   HGBA1C 4.9 12/20/2022   MPG 93.93 12/20/2022   MPG 93.93 07/06/2021   No results found for: "PROLACTIN" Lab Results  Component Value Date   CHOL 208 (H) 02/24/2014   TRIG 81.0 02/24/2014   HDL 54.90 02/24/2014   CHOLHDL 4 02/24/2014   VLDL 16.2 02/24/2014   LDLCALC 137 (H) 02/24/2014   Lab Results  Component Value Date   TSH 0.18 (L) 03/20/2023   TSH 0.175 (L) 12/21/2022    Therapeutic Level Labs: No results found  for: "LITHIUM" No results found for: "VALPROATE" No results found for: "CBMZ"   Screenings: AIMS    Flowsheet Row [facility]  AIMS Total Score 0      AUDIT    Flowsheet Row Ad[facility] (Dis[facility]  Alcohol  Use Disorder Identification Test Final Score (AUDIT) 1      GAD-7    Flowsheet Row Office Visit from 03/20/2023 in [facility] [place] Counselor from 01/09/2023 in BEHAVIORAL HEALTH PARTIAL HOSPITALIZATION PROGRAM Office Visit from 11/22/2022 in [facility]  Total GAD-7 Score 7 15 12       PHQ2-9    Flowsheet Row Office Visit from 07/30/2023 in [facility] [place] Counselor from 06/14/2023 in BEHAVIORAL HEALTH INTENSIVE PSYCH Counselor from 06/08/2023 in BE[facility]AVIORAL [facility]ALTH PARTIAL HOSPITALIZATION PROGRAM Counselor from 06/01/2023 in BEHAVIORAL HEALTH PARTIAL HOSPITALIZATION PROGRAM Counselor from 05/03/2023 in BEHAVIORAL HEALTH PARTIAL HOSPITALIZATION PROGRAM  PHQ-2 Total Score 0 2 2 2 4   PHQ-9 Total Score -- 9 6 17 14       Flowsheet Row Counselor from 06/14/2023 in BEHAVIORAL HEALTH INTENSIVE PSYCH Counselor from 06/01/2023 in BEHAVIORAL HEALTH PARTIAL HOSPITALIZATION PROGRAM Counselor from 05/03/2023 in BEHAVIORAL HEALTH PARTIAL HOSPITALIZATION PROGRAM  C-SSRS RISK CATEGORY Error: Question 6 not populated No Risk Error: Question 2 not populated       Collaboration of Care: Collaboration of Care: Medication Management AEB medication prescription  Patient/Guardian was advised Release of Information must be obtained prior to any record release in order to collaborate their care with an outside provider. Patient/Guardian was advised if they have not already done so to contact the registration department to sign all necessary forms in order for us  to release  information regarding their care.   Consent: Patient/Guardian gives verbal consent for treatment and assignment of benefits for services provided during this visit. Patient/Guardian expressed understanding and agreed to proceed.    Yves Herb, MD 12/20/2023, 1:59 PM   Virtual Visit via Video Note  I connected with Emily Park on 12/20/23 at  1:30 PM EDT by a video enabled telemedicine application and verified that I am speaking with the correct person using two identifiers.  Location: Patient: Home Provider: Home Office   I discussed the limitations of evaluation and management by telemedicine and the availability of in person appointments. The patient expressed understanding and agreed to proceed.   I discussed the assessment and treatment plan with the patient. The patient was provided an opportunity to ask questions and all were answered. The patient agreed with the plan and demonstrated an understanding of the instructions.   The patient was advised to call back or seek an in-person evaluation if the symptoms worsen or if the condition fails to improve as anticipated.  I provided 20 minutes of non-face-to-face time during this encounter.   Yves Herb, MD

## 2023-12-20 ENCOUNTER — Encounter (HOSPITAL_COMMUNITY): Payer: Self-pay | Admitting: Psychiatry

## 2023-12-20 ENCOUNTER — Telehealth (HOSPITAL_COMMUNITY): Admitting: Psychiatry

## 2023-12-20 DIAGNOSIS — F332 Major depressive disorder, recurrent severe without psychotic features: Secondary | ICD-10-CM | POA: Diagnosis not present

## 2023-12-20 DIAGNOSIS — G43909 Migraine, unspecified, not intractable, without status migrainosus: Secondary | ICD-10-CM | POA: Diagnosis not present

## 2023-12-20 DIAGNOSIS — F411 Generalized anxiety disorder: Secondary | ICD-10-CM | POA: Diagnosis not present

## 2023-12-20 MED ORDER — CLONAZEPAM 1 MG PO TABS: 1.0000 mg | ORAL_TABLET | Freq: Every day | ORAL | 0 refills | Status: DC | PRN

## 2023-12-20 MED ORDER — PRAZOSIN HCL 1 MG PO CAPS: 1.0000 mg | ORAL_CAPSULE | Freq: Every day | ORAL | 0 refills | Status: AC

## 2023-12-20 MED ORDER — GABAPENTIN 300 MG PO CAPS
300.0000 mg | ORAL_CAPSULE | Freq: Three times a day (TID) | ORAL | 0 refills | Status: DC | PRN
Start: 2023-12-20 — End: 2024-04-15

## 2023-12-20 MED ORDER — SERTRALINE HCL 100 MG PO TABS: 150.0000 mg | ORAL_TABLET | Freq: Every day | ORAL | 0 refills | Status: DC

## 2023-12-20 MED ORDER — CLONAZEPAM 0.5 MG PO TABS: 0.5000 mg | ORAL_TABLET | Freq: Every day | ORAL | 0 refills | Status: DC | PRN

## 2024-01-30 NOTE — Progress Notes (Unsigned)
 BH MD/PA/NP OP Progress Note  02/01/2024 1:46 PM Emily Park  MRN:  [id number]  Visit Diagnosis:    ICD-10-CM   1. MDD (major depressive disorder), recurrent severe, without psychosis (HCC)  F33.2 lurasidone (LATUDA) 40 MG TABS tablet    sertraline  (ZOLOFT ) 100 MG tablet    doxepin  (SINEQUAN ) 75 MG capsule    2. Generalized anxiety disorder  F41.1 lurasidone (LATUDA) 40 MG TABS tablet    sertraline  (ZOLOFT ) 100 MG tablet    clonazePAM  (KLONOPIN ) 0.5 MG tablet    doxepin  (SINEQUAN ) 75 MG capsule    3. Migraine without status migrainosus, not intractable, unspecified migraine type  G43.909       Assessment: Emily Park is a 53 y.o. female with a history of MDD with suicide attempt and subsequent hospitalization in May 2024, GAD, reported ADHD, and IBS who presented to [facility] for initial evaluation on 03/27/2023.  At initial evaluation patient reported struggling with neurovegetative symptoms of depression including low mood, anhedonia, amotivation, disturbed sleep, hopelessness, feelings of worthlessness, and negative self thoughts.  She denied any SI/HI or thoughts of self-harm though had acted on suicidal thoughts in May 2024.  Crisis resources were reviewed and patient has completed safety planning.  Medications are managed by her husband and she is able to list a number of supports she can reach out to if needed.  Patient also struggled with symptoms of anxiety including excessive worry, fear of something awful happening, restlessness, palpitations, chest pressure, and shortness of breath.  At times the anxiety can progress to panic episodes.  Patient does have a past trauma history from which she experiences nightmares and hypervigilance though she denies any other symptoms consistent with PTSD.  Also of note she has been prescribed high-dose benzodiazepines for over a decade and is in the process of tapering off of them.  Psychosocially patient  has good supports in husband, family, and friends.  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  There is increased financial stressors at time of initial evaluation.  Patient met criteria for MDD and GAD.  She did have a former diagnosis of ADHD this could be complicated by her other underlying psychiatric disorders and further neurological testing would be appropriate.  Emily Park presents for follow-up evaluation. Today, 02/01/24, patient reports increased depression on meeting today. She is still experiencing residual grief following the passing of her father which was never processed. Patient has also regressed in her self care as she focused on caring for her husband during his cancer treatment. Patient has had intermittent thoughts of SI with plan to crash her car, but denies any intent to act on it. We did discuss safety planning today and recommended her husband control her medications. Given severity of symptoms recommended patient start PHP and referral was placed. We also will titrate Zoloft  to 200 mg daily and start Latuda 40 mg w/dinner. Risks and benefits were reviewed. Patient will follow up in 1.5 weeks unless she has started PHP.   Psychotherapeutic interventions were used during today's session. From 10:35 AM to 10:58 AM. Therapeutic interventions included empathic listening, supportive therapy, cognitive and behavioral therapy, motivational interviewing. Used supportive interviewing techniques to provide emotional validation. Worked on cognitive reframing techniques and recommendations made for behavioral activation.  Improvement was evidenced by patient's participation and identified commitment to therapy goals.   Plan:  - Increase  Zoloft  to 200 mg daily - Start Latuda 40 mg w/ dinner - Continue Gabapentin  300 mg TID prn for anxiety - Continue Prazosin  1 mg QHS - Continue Topamax  100 mg in the morning for migraines - Continue   Klonopin  0.5 mg daily as needed for anxiety. - Continue Doxepin  75 mg QHS - Attending a group for grief counseling once a week which is 13 weeks long - Will restart with grief counseling when able - PHP referral placed - Neuropsysch testing referral - Crisis resources reviewed - Follow up in 1.5 weeks  Chief Complaint:  Chief Complaint  Patient presents with   Follow-up   HPI: Emily Park presents while riding in the car with her husband. Service was going in and out during the session, making communication more difficult at times. Patient reports that she is accompanying her husband to get his J tube out today.   Things with her husband have been going alright. He is feeling better but was found to have some concerning lymph nodes. He will thus be starting a 3 month course of chemotherapy in the near future.   As for Emily Park she initially reported that she was doing well. However as the session proceeded she reports that she has been really struggling with depression. She has had poor sleep, feeling of hopelessness, poor self care, anhedonia, amotivation, negative self thoughts, feelings of being a burden, and intermittent thoughts of suicide by crashing her car. She denies any intention to act on these thoughts and lists her husband as a protective factor. We did discuss safety planning and recommended that her husband control the medications moving forward.  Patient is uncertain of what led to her current decline and is frustrated with herself for still struggling after her fathers death/suicide. Support was provided and we reminder her that she has been dealing with a lot. As shortly after her father passed her husband got cancer which really impacted her ability to continue with her self care. Recently patient has not been focusing on herself and instead working to care for her husband. The stressor of fathers day coming up has also been hard and Emily Park has found her self wondering if her father truly  cared about her.   Patient was also negative towards herself about not working currently. We reviewed the importance of self care and building a strong mental foundation for herself before adding more stressors. Patient did acknowledge this.  Given her current presentation we recommend that patient go to PHP/IOP which she was agreeable to. We also recommended titrating Zoloft  and starting Latuda adjunct therapy which she was open to trying. Patient had asked about medication for insomnia as the racing thoughts at bed have been a major concern. We reviewed that her depression and racing thoughts are the primary factor for her insomnia. Given that we are making 2 med changes, she has already trialed and failed multiple other sleep aids, and Doxepin  is most effective for sleep at lower doses we recommended keeping the rest of her medications the same today. Then we made a plan to follow up in 1.5 weeks presuming that patient is not in PHP by that time.   Past Psychiatric History: Patient was admitted to [facility] in 12/2022.  After which she stepped down to PHP on 01/16/2023 followed by IOP on 02/08/2023.  Patient had been admitted to the hospital following a suicide attempt by insulin  overdose.  Prior to this she had no other suicide attempts or episodes of self-harm. OPT: Dr.  Deborra Falter 10+ years Therapist: [employer] [facility] 2x, but no insurance coverage, appointment scheduled with Bettyjane Brunet Previous: Klonopin  1mg  QHS and doxepin  75mg  QHS, patient had been on Klonopin  10 years, Abilify, Prozac  (worked then pooped out after 20+ years), Trintellix , doxepin  (poor benefit),  Xanax  (too strong), Wellbutrin ( failed), BuSar (ineffective), clonidine , mirtazapine , Atarax  (ineffective), Ambien ( behavior changes, spending money suddenly). Trazodone did not help with sleep. 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  Patient is  being tapered off of Klonopin  now.  She denies any current ethanol use though had in the past.  Patient discontinued due to developing stomach ulcers.  She denies any THC, tobacco, or other substance use.  Past Medical History:  Past Medical History:  Diagnosis Date   Abnormal TSH    ADD (attention deficit disorder)    Anemia    Anxiety    Arrhythmia    Decreased libido    Depression    Heart murmur    Hyperlipidemia    Hypokalemia    Migraine    Nephrolithiasis    Pap smear abnormality of cervix/human papillomavirus (HPV) positive    Sleep difficulties    SVT (supraventricular tachycardia) (HCC)     Past Surgical History:  Procedure Laterality Date   ABLATION     Cardiac ablation, endometrial ablation   BREAST LUMPECTOMY     right breast   BREAST LUMPECTOMY Right    CARDIOVERSION     COLONOSCOPY WITH PROPOFOL  N/A 05/16/2021   Procedure: COLONOSCOPY WITH PROPOFOL ;  Surgeon: Baldo Bonds, MD;  Location: [facility];  Service: Endoscopy;  Laterality: N/A;   COLPOSCOPY     w/ cervical biopsy   DILITATION & CURRETTAGE/HYSTROSCOPY WITH NOVASURE ABLATION N/A 09/11/2014   Procedure: DILATATION & CURETTAGE/HYSTEROSCOPY WITH NOVASURE ABLATION;  Surgeon: Camillo Celestine, MD;  Location: [facility];  Service: Gynecology;  Laterality: N/A;   GIVENS CAPSULE STUDY N/A 05/16/2021   Procedure: GIVENS CAPSULE STUDY;  Surgeon: Baldo Bonds, MD;  Location: [facility];  Service: Endoscopy;  Laterality: N/A;   XI ROBOT ASSISTED RECTOPEXY N/A 07/07/2021   Procedure: XI ROBOT ASSISTED RECTOPEXY;  Surgeon: Candyce Champagne, MD;  Location: [facility];  Service: General;  Laterality: N/A;   XI ROBOTIC ASSISTED LOWER ANTERIOR RESECTION N/A 07/07/2021   Procedure: XI ROBOTIC ASSISTED LOW ANTERIOR RECTOSIGMOID RESECTION WITH RIGID PROCTOSCOPY, TAP BLOCK;  Surgeon: Candyce Champagne, MD;  Location: [facility];  Service: General;  Laterality: N/A;   Family History:  Family History  Problem Relation Age of Onset    Hypothyroidism Mother    Depression Mother    Cancer Mother        thyroid    Thyroid  disease Mother    Alcohol  abuse Father    Hypertension Father    Heart attack Father        Age 70   AAA (abdominal aortic aneurysm) Father    Depression Sister    Hypothyroidism Sister    Hyperthyroidism Sister    Diabetes Maternal Grandfather    Depression Maternal Grandmother    Breast cancer Paternal Grandmother    Depression Cousin     Social History:  Social History   Socioeconomic History   Marital status: Married    Spouse name: Not on file   Number of children: 2   Years of education: college   Highest education level: Associate degree: academic program  Occupational History  Occupation: Charity fundraiser with Engineer, manufacturing systems: [employer]   Tobacco Use   Smoking status: Never    Passive exposure: Current   Smokeless tobacco: Never  Vaping Use   Vaping status: Never Used  Substance and Sexual Activity   Alcohol  use: Not Currently    Alcohol /week: 2.0 standard drinks of alcohol     Types: 2 Glasses of wine per week   Drug use: Not Currently   Sexual activity: Yes    Partners: Male    Comment: Married  Other Topics Concern   Not on file  Social History Narrative   Lives at home with two sons.   Right-handed.   60 ounces of tea and soda per day.   Social Drivers of Health   Financial Resource Strain: Low Risk  (10/16/2018)   Received from [facility] visits prior to 10/28/2022., [facility] visits prior to 10/28/2022.   Overall Financial Resource Strain (CARDIA)    Difficulty of Paying Living Expenses: Not hard at all  Food Insecurity: Food Insecurity Present (12/28/2022)   Hunger Vital Sign    Worried About Running Out of Food in the Last Year: Sometimes true    Ran Out of Food in the Last Year: Sometimes true  Transportation Needs: No Transportation Needs (12/28/2022)   PRAPARE - Administrator, Civil Service (Medical): No    Lack of  Transportation (Non-Medical): No  Physical Activity: Not on file  Stress: Not on file  Social Connections: Unknown (01/12/2022)   [facility]   Social Network    Social Network: Not on file    Allergies:  Allergies  Allergen Reactions   Augmentin  [Amoxicillin -Pot Clavulanate]     Stomach pain   Zithromax [Azithromycin] Rash    Current Medications: Current Outpatient Medications  Medication Sig Dispense Refill   lurasidone (LATUDA) 40 MG TABS tablet Take 1 tablet (40 mg total) by mouth daily with supper. 30 tablet 2   ACCRUFER  30 MG CAPS Take 1 capsule (30 mg total) by mouth 2 (two) times daily. 90 capsule 0   aspirin  EC 81 MG tablet Take 1 tablet (81 mg total) by mouth daily.     Cholecalciferol (VITAMIN D3) 50 MCG (2000 UT) capsule Take 2,000 Units by mouth daily.     clonazePAM  (KLONOPIN ) 0.5 MG tablet Take 1 tablet (0.5 mg total) by mouth daily as needed for anxiety. 30 tablet 0   doxepin  (SINEQUAN ) 75 MG capsule Take 1 capsule (75 mg total) by mouth at bedtime. 90 capsule 0   gabapentin  (NEURONTIN ) 300 MG capsule Take 1 capsule (300 mg total) by mouth 3 (three) times daily as needed. 270 capsule 0   HYDROcodone -acetaminophen  (NORCO/VICODIN) 5-325 MG tablet Take 1 tablet by mouth every 6 (six) hours as needed for severe pain (pain score 7-10). 20 tablet 0   hydrocortisone  (ANUSOL -HC) 2.5 % rectal cream Apply 1 Application topically 4 (four) times daily as needed for hemorrhoids. 30 g 0   LINZESS  145 MCG CAPS capsule TAKE 1 CAPSULE (145 MCG TOTAL) BY MOUTH ONCE DAILY 90 capsule 3   LYLLANA 0.05 MG/24HR patch Place 1 patch onto the skin.     Multiple Vitamin (MULTIVITAMIN WITH MINERALS) TABS tablet Take 1 tablet by mouth every morning.     pantoprazole  (PROTONIX ) 40 MG tablet Take 1 tablet (40 mg total) by mouth 2 (two) times daily. 180 tablet 3   polyethylene glycol (MIRALAX  / GLYCOLAX ) 17  g packet Take 17 g by mouth 2 (two) times daily. (Patient  taking differently: Take 17 g by mouth daily as needed for moderate constipation.) 14 each 0   polyvinyl alcohol  (LIQUIFILM TEARS) 1.4 % ophthalmic solution Place 1 drop into both eyes as needed for dry eyes. 15 mL 0   potassium chloride  SA (KLOR-CON  M20) 20 MEQ tablet Take 1 tablet (20 mEq total) by mouth 2 (two) times daily. 180 tablet 1   prazosin  (MINIPRESS ) 1 MG capsule Take 1 capsule (1 mg total) by mouth at bedtime. 90 capsule 0   progesterone (PROMETRIUM) 100 MG capsule Take 100 mg by mouth at bedtime.     rizatriptan  (MAXALT ) 10 MG tablet PLEASE SEE ATTACHED FOR DETAILED DIRECTIONS     sertraline  (ZOLOFT ) 100 MG tablet Take 2 tablets (200 mg total) by mouth daily. 180 tablet 0   Sodium Fluoride  (PREVIDENT  5000 BOOSTER PLUS) 1.1 % PSTE As directed tid 100 mL 3   topiramate  (TOPAMAX ) 100 MG tablet Take 1 tablet (100 mg total) by mouth daily. 90 tablet 0   XIFAXAN 550 MG TABS tablet Take 550 mg by mouth 3 (three) times daily.     No current facility-administered medications for this visit.     Psychiatric Specialty Exam: Review of Systems  There were no vitals taken for this visit.There is no height or weight on file to calculate BMI.  General Appearance: Fairly Groomed  Eye Contact:  Fair  Speech:  Clear and Coherent  Volume:  Normal  Mood:  Depressed, Hopeless, and Worthless  Affect:  Congruent and Tearful  Thought Process:  Coherent  Orientation:  Full (Time, Place, and Person)  Thought Content: Logical   Suicidal Thoughts:  No  Homicidal Thoughts:  No  Memory:  Immediate;   Fair  Judgement:  Fair  Insight:  Lacking  Psychomotor Activity:  Decreased  Concentration:  Concentration: Fair  Recall:  Fair  Fund of Knowledge: Fair  Language: Good  Akathisia:  No    AIMS (if indicated): not done  Assets:  Communication Skills Desire for Improvement Financial Resources/Insurance Housing Social Support  ADL's:  Intact  Cognition: WNL  Sleep:  Poor   Metabolic Disorder  Labs: Lab Results  Component Value Date   HGBA1C 4.9 12/20/2022   MPG 93.93 12/20/2022   MPG 93.93 07/06/2021   No results found for: "PROLACTIN" Lab Results  Component Value Date   CHOL 208 (H) 02/24/2014   TRIG 81.0 02/24/2014   HDL 54.90 02/24/2014   CHOLHDL 4 02/24/2014   VLDL 16.2 02/24/2014   LDLCALC 137 (H) 02/24/2014   Lab Results  Component Value Date   TSH 0.18 (L) 03/20/2023   TSH 0.175 (L) 12/21/2022    Therapeutic Level Labs: No results found for: "LITHIUM" No results found for: "VALPROATE" No results found for: "CBMZ"   Screenings: AIMS    Flowsheet Row [facility]  AIMS Total Score 0      AUDIT    Flowsheet Row Ad[facility] ([facility]  Alcohol  Use Disorder Identification Test Final Score (AUDIT) 1      GAD-7    Flowsheet Row Office Visit from 03/20/2023 in [facility] [place] Counselor from 01/09/2023 in BEHAVIORAL HEALTH PARTIAL HOSPITALIZATION PROGRAM Office Visit from 11/22/2022 in [facility]  Total GAD-7 Score 7 15 12  PHQ2-9    Flowsheet Row Office Visit from 07/30/2023 in [facility] Counselor from 06/14/2023 in BEHAVIORAL HEALTH INTENSIVE PSYCH Counselor from 06/08/2023 in BEHAVIORAL HEALTH PARTIAL HOSPITALIZATION PROGRAM Counselor from 06/01/2023 in BEHAVIORAL HEALTH PARTIAL HOSPITALIZATION PROGRAM Counselor from 05/03/2023 in BEHAVIORAL HEALTH PARTIAL HOSPITALIZATION PROGRAM  PHQ-2 Total Score 0 2 2 2 4   PHQ-9 Total Score -- 9 6 17 14       Flowsheet Row Counselor from 06/14/2023 in BEHAVIORAL HEALTH INTENSIVE PSYCH Counselor from 06/01/2023 in BEHAVIORAL HEALTH PARTIAL HOSPITALIZATION PROGRAM Counselor from 05/03/2023 in BEHAVIORAL HEALTH PARTIAL HOSPITALIZATION PROGRAM  C-SSRS RISK CATEGORY Error: Question 6  not populated No Risk Error: Question 2 not populated       Collaboration of Care: Collaboration of Care: Medication Management AEB medication prescription  Patient/Guardian was advised Release of Information must be obtained prior to any record release in order to collaborate their care with an outside provider. Patient/Guardian was advised if they have not already done so to contact the registration department to sign all necessary forms in order for us  to release information regarding their care.   Consent: Patient/Guardian gives verbal consent for treatment and assignment of benefits for services provided during this visit. Patient/Guardian expressed understanding and agreed to proceed.    Yves Herb, MD 02/01/2024, 1:46 PM   Virtual Visit via Video Note  I connected with Ethal Worm wood on 02/01/24 at 10:30 AM EDT by a video enabled telemedicine application and verified that I am speaking with the correct person using two identifiers.  Location: Patient: Home Provider: Home Office   I discussed the limitations of evaluation and management by telemedicine and the availability of in person appointments. The patient expressed understanding and agreed to proceed.   I discussed the assessment and treatment plan with the patient. The patient was provided an opportunity to ask questions and all were answered. The patient agreed with the plan and demonstrated an understanding of the instructions.   The patient was advised to call back or seek an in-person evaluation if the symptoms worsen or if the condition fails to improve as anticipated.  I provided 20 minutes of non-face-to-face time during this encounter.   Yves Herb, MD

## 2024-02-01 ENCOUNTER — Encounter (HOSPITAL_COMMUNITY): Payer: Self-pay | Admitting: Psychiatry

## 2024-02-01 ENCOUNTER — Telehealth (HOSPITAL_COMMUNITY): Admitting: Psychiatry

## 2024-02-01 DIAGNOSIS — F411 Generalized anxiety disorder: Secondary | ICD-10-CM

## 2024-02-01 DIAGNOSIS — G43909 Migraine, unspecified, not intractable, without status migrainosus: Secondary | ICD-10-CM

## 2024-02-01 DIAGNOSIS — F332 Major depressive disorder, recurrent severe without psychotic features: Secondary | ICD-10-CM

## 2024-02-01 MED ORDER — DOXEPIN HCL 75 MG PO CAPS: 75.0000 mg | ORAL_CAPSULE | Freq: Every day | ORAL | 0 refills | Status: DC

## 2024-02-01 MED ORDER — SERTRALINE HCL 100 MG PO TABS: 200.0000 mg | ORAL_TABLET | Freq: Every day | ORAL | 0 refills | Status: DC

## 2024-02-01 MED ORDER — LURASIDONE HCL 40 MG PO TABS: 40.0000 mg | ORAL_TABLET | Freq: Every day | ORAL | 2 refills | Status: DC

## 2024-02-01 MED ORDER — CLONAZEPAM 0.5 MG PO TABS: 0.5000 mg | ORAL_TABLET | Freq: Every day | ORAL | 0 refills | Status: AC | PRN

## 2024-02-04 ENCOUNTER — Telehealth (HOSPITAL_COMMUNITY): Payer: Self-pay | Admitting: Licensed Clinical Social Worker

## 2024-02-08 ENCOUNTER — Telehealth (HOSPITAL_COMMUNITY): Payer: Self-pay | Admitting: Licensed Clinical Social Worker

## 2024-02-08 NOTE — Telephone Encounter (Signed)
 See call intake

## 2024-02-11 ENCOUNTER — Telehealth (HOSPITAL_COMMUNITY): Payer: Self-pay | Admitting: Professional

## 2024-02-11 ENCOUNTER — Telehealth (HOSPITAL_COMMUNITY): Payer: Self-pay | Admitting: Psychiatry

## 2024-02-11 NOTE — Telephone Encounter (Signed)
 See call log

## 2024-02-11 NOTE — Telephone Encounter (Signed)
 D:  Pt returned case manager's call.  Pt is requesting to restart in virtual MH-IOP.  I can't do PHP and then step down to you all because I am trying to get employment.  A:  Re-oriented pt.  Pt requesting to start on 02-21-24 @ 9 a.m.  Encouraged pt to call the cm back if needing to start sooner.  R:  Pt receptive.

## 2024-02-11 NOTE — Progress Notes (Deleted)
 BH MD/PA/NP OP Progress Note  02/12/2024 8:04 AM Emily Park  MRN:  [id number]  Visit Diagnosis:    ICD-10-CM   1. MDD (major depressive disorder), recurrent severe, without psychosis (HCC)  F33.2     2. Generalized anxiety disorder  F41.1        Assessment: Emily Park is a 53 y.o. female with a history of MDD with suicide attempt and subsequent hospitalization in May 2024, GAD, reported ADHD, and IBS who presented to [facility] at [facility] for initial evaluation on 03/27/2023.  At initial evaluation patient reported struggling with neurovegetative symptoms of depression including low mood, anhedonia, amotivation, disturbed sleep, hopelessness, feelings of worthlessness, and negative self thoughts.  She denied any SI/HI or thoughts of self-harm though had acted on suicidal thoughts in May 2024.  Crisis resources were reviewed and patient has completed safety planning.  Medications are managed by her husband and she is able to list a number of supports she can reach out to if needed.  Patient also struggled with symptoms of anxiety including excessive worry, fear of something awful happening, restlessness, palpitations, chest pressure, and shortness of breath.  At times the anxiety can progress to panic episodes.  Patient does have a past trauma history from which she experiences nightmares and hypervigilance though she denies any other symptoms consistent with PTSD.  Also of note she has been prescribed high-dose benzodiazepines for over a decade and is in the process of tapering off of them.  Psychosocially patient has good supports in husband, family, and friends.  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  There is increased financial stressors at time of initial evaluation.  Patient met criteria for MDD and GAD.  She did have a former diagnosis of ADHD this could be complicated by her other underlying  psychiatric disorders and further neurological testing would be appropriate.  Emily Park presents for follow-up evaluation. Today, 02/12/24, patient reports     increased depression on meeting today. She is still experiencing residual grief following the passing of her father which was never processed. Patient has also regressed in her self care as she focused on caring for her husband during his cancer treatment. Patient has had intermittent thoughts of SI with plan to crash her car, but denies any intent to act on it. We did discuss safety planning today and recommended her husband control her medications. Given severity of symptoms recommended patient start PHP and referral was placed. We also will titrate Zoloft  to 200 mg daily and start Latuda  40 mg w/dinner. Risks and benefits were reviewed. Patient will follow up in 1.5 weeks unless she has started PHP.   Psychotherapeutic interventions were used during today's session. From 10:35 AM to 10:58 AM. Therapeutic interventions included empathic listening, supportive therapy, cognitive and behavioral therapy, motivational interviewing. Used supportive interviewing techniques to provide emotional validation. Worked on cognitive reframing techniques and recommendations made for behavioral activation.  Improvement was evidenced by patient's participation and identified commitment to therapy goals.   Plan:  - Increase Zoloft  to 200 mg daily - Start Latuda  40 mg w/ dinner - Continue Gabapentin  300 mg TID prn for anxiety - Continue Prazosin  1 mg QHS - Continue Topamax  100 mg in the morning for migraines - Continue  Klonopin  0.5 mg daily as needed for anxiety. - Continue Doxepin  75 mg QHS - Attending a group for grief counseling  once a week which is 13 weeks long - Will restart with grief counseling when able - PHP referral placed - Neuropsysch testing referral - Crisis resources reviewed - Follow up in 1.5 weeks  Chief Complaint:  Chief  Complaint  Patient presents with   Follow-up   HPI: Emily Park presents     while riding in the car with her husband. Service was going in and out during the session, making communication more difficult at times. Patient reports that she is accompanying her husband to get his J tube out today.   Things with her husband have been going alright. He is feeling better but was found to have some concerning lymph nodes. He will thus be starting a 3 month course of chemotherapy in the near future.   As for Emily Park she initially reported that she was doing well. However as the session proceeded she reports that she has been really struggling with depression. She has had poor sleep, feeling of hopelessness, poor self care, anhedonia, amotivation, negative self thoughts, feelings of being a burden, and intermittent thoughts of suicide by crashing her car. She denies any intention to act on these thoughts and lists her husband as a protective factor. We did discuss safety planning and recommended that her husband control the medications moving forward.  Patient is uncertain of what led to her current decline and is frustrated with herself for still struggling after her fathers death/suicide. Support was provided and we reminder her that she has been dealing with a lot. As shortly after her father passed her husband got cancer which really impacted her ability to continue with her self care. Recently patient has not been focusing on herself and instead working to care for her husband. The stressor of fathers day coming up has also been hard and Emily Park has found her self wondering if her father truly cared about her.   Patient was also negative towards herself about not working currently. We reviewed the importance of self care and building a strong mental foundation for herself before adding more stressors. Patient did acknowledge this.  Given her current presentation we recommend that patient go to PHP/IOP which she was  agreeable to. We also recommended titrating Zoloft  and starting Latuda  adjunct therapy which she was open to trying. Patient had asked about medication for insomnia as the racing thoughts at bed have been a major concern. We reviewed that her depression and racing thoughts are the primary factor for her insomnia. Given that we are making 2 med changes, she has already trialed and failed multiple other sleep aids, and Doxepin  is most effective for sleep at lower doses we recommended keeping the rest of her medications the same today. Then we made a plan to follow up in 1.5 weeks presuming that patient is not in PHP by that time.   Past Psychiatric History: Patient was admitted to [facility] in 12/2022.  After which she stepped down to PHP on 01/16/2023 followed by IOP on 02/08/2023.  Patient had been admitted to the hospital following a suicide attempt by insulin  overdose.  Prior to this she had no other suicide attempts or episodes of self-harm. OPT: Dr. Vincente 10+ years Therapist: [facility] 2x, but no insurance coverage, appointment scheduled with Elgie Previous: Klonopin  1mg  QHS and doxepin  75mg  QHS, patient had been on Klonopin  10 years, Abilify, Prozac  (worked then pooped out after 20+ years), Trintellix , doxepin  (poor benefit),  Xanax  (too strong), Wellbutrin ( failed), BuSar (ineffective), clonidine , mirtazapine , Atarax  (ineffective),  Ambien ( behavior changes, spending money suddenly). Trazodone did not help with sleep. 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  Patient is being tapered off of Klonopin  now.  She denies any current ethanol use though had in the past.  Patient discontinued due to developing stomach ulcers.  She denies any THC, tobacco, or other substance use.  Past Medical History:  Past Medical History:  Diagnosis Date   Abnormal TSH    ADD (attention deficit disorder)     Anemia    Anxiety    Arrhythmia    Decreased libido    Depression    Heart murmur    Hyperlipidemia    Hypokalemia    Migraine    Nephrolithiasis    Pap smear abnormality of cervix/human papillomavirus (HPV) positive    Sleep difficulties    SVT (supraventricular tachycardia) (HCC)     Past Surgical History:  Procedure Laterality Date   ABLATION     Cardiac ablation, endometrial ablation   BREAST LUMPECTOMY     right breast   BREAST LUMPECTOMY Right    CARDIOVERSION     COLONOSCOPY WITH PROPOFOL  N/A 05/16/2021   Procedure: COLONOSCOPY WITH PROPOFOL ;  Surgeon: Dianna Specking, MD;  Location: [facility];  Service: Endoscopy;  Laterality: N/A;   COLPOSCOPY     w/ cervical biopsy   DILITATION & CURRETTAGE/HYSTROSCOPY WITH NOVASURE ABLATION N/A 09/11/2014   Procedure: DILATATION & CURETTAGE/HYSTEROSCOPY WITH NOVASURE ABLATION;  Surgeon: Charlie JINNY Flowers, MD;  Location: [facility];  Service: Gynecology;  Laterality: N/A;   GIVENS CAPSULE STUDY N/A 05/16/2021   Procedure: GIVENS CAPSULE STUDY;  Surgeon: Dianna Specking, MD;  Location: [facility];  Service: Endoscopy;  Laterality: N/A;   XI ROBOT ASSISTED RECTOPEXY N/A 07/07/2021   Procedure: XI ROBOT ASSISTED RECTOPEXY;  Surgeon: Sheldon Standing, MD;  Location: [facility];  Service: General;  Laterality: N/A;   XI ROBOTIC ASSISTED LOWER ANTERIOR RESECTION N/A 07/07/2021   Procedure: XI ROBOTIC ASSISTED LOW ANTERIOR RECTOSIGMOID RESECTION WITH RIGID PROCTOSCOPY, TAP BLOCK;  Surgeon: Sheldon Standing, MD;  Location: [facility];  Service: General;  Laterality: N/A;   Family History:  Family History  Problem Relation Age of Onset   Hypothyroidism Mother    Depression Mother    Cancer Mother        thyroid    Thyroid  disease Mother    Alcohol  abuse Father    Hypertension Father    Heart attack Father        Age 15   AAA (abdominal aortic aneurysm) Father    Depression Sister    Hypothyroidism Sister    Hyperthyroidism Sister    Diabetes  Maternal Grandfather    Depression Maternal Grandmother    Breast cancer Paternal Grandmother    Depression Cousin     Social History:  Social History   Socioeconomic History   Marital status: Married    Spouse name: Not on file   Number of children: 2   Years of education: college   Highest education level: Associate degree: academic program  Occupational History   Occupation: Charity fundraiser with Engineer, manufacturing systems: [employer]   Tobacco Use   Smoking status: Never    Passive exposure: Current   Smokeless tobacco: Never  Vaping Use   Vaping status: Never Used  Substance and Sexual Activity   Alcohol  use: Not Currently    Alcohol /week: 2.0 standard drinks of  alcohol     Types: 2 Glasses of wine per week   Drug use: Not Currently   Sexual activity: Yes    Partners: Male    Comment: Married  Other Topics Concern   Not on file  Social History Narrative   Lives at home with two sons.   Right-handed.   60 ounces of tea and soda per day.   Social Drivers of Health   Financial Resource Strain: Low Risk  (10/16/2018)   Received from [facility] visits prior to 10/28/2022.   Overall Financial Resource Strain (CARDIA)    Difficulty of Paying Living Expenses: Not hard at all  Food Insecurity: Food Insecurity Present (12/28/2022)   Hunger Vital Sign    Worried About Running Out of Food in the Last Year: Sometimes true    Ran Out of Food in the Last Year: Sometimes true  Transportation Needs: No Transportation Needs (12/28/2022)   PRAPARE - Administrator, Civil Service (Medical): No    Lack of Transportation (Non-Medical): No  Physical Activity: Not on file  Stress: Not on file  Social Connections: Unknown (01/12/2022)   Received from [facility]   Social Network    Social Network: Not on file    Allergies:  Allergies  Allergen Reactions   Augmentin  [Amoxicillin -Pot Clavulanate]     Stomach pain   Zithromax [Azithromycin] Rash    Current  Medications: Current Outpatient Medications  Medication Sig Dispense Refill   ACCRUFER  30 MG CAPS Take 1 capsule (30 mg total) by mouth 2 (two) times daily. 90 capsule 0   aspirin  EC 81 MG tablet Take 1 tablet (81 mg total) by mouth daily.     Cholecalciferol (VITAMIN D3) 50 MCG (2000 UT) capsule Take 2,000 Units by mouth daily.     clonazePAM  (KLONOPIN ) 0.5 MG tablet Take 1 tablet (0.5 mg total) by mouth daily as needed for anxiety. 30 tablet 0   doxepin  (SINEQUAN ) 75 MG capsule Take 1 capsule (75 mg total) by mouth at bedtime. 90 capsule 0   gabapentin  (NEURONTIN ) 300 MG capsule Take 1 capsule (300 mg total) by mouth 3 (three) times daily as needed. 270 capsule 0   HYDROcodone -acetaminophen  (NORCO/VICODIN) 5-325 MG tablet Take 1 tablet by mouth every 6 (six) hours as needed for severe pain (pain score 7-10). 20 tablet 0   hydrocortisone  (ANUSOL -HC) 2.5 % rectal cream Apply 1 Application topically 4 (four) times daily as needed for hemorrhoids. 30 g 0   LINZESS  145 MCG CAPS capsule TAKE 1 CAPSULE (145 MCG TOTAL) BY MOUTH ONCE DAILY 90 capsule 3   lurasidone  (LATUDA ) 40 MG TABS tablet Take 1 tablet (40 mg total) by mouth daily with supper. 30 tablet 2   LYLLANA 0.05 MG/24HR patch Place 1 patch onto the skin.     Multiple Vitamin (MULTIVITAMIN WITH MINERALS) TABS tablet Take 1 tablet by mouth every morning.     pantoprazole  (PROTONIX ) 40 MG tablet Take 1 tablet (40 mg total) by mouth 2 (two) times daily. 180 tablet 3   polyethylene glycol (MIRALAX  / GLYCOLAX ) 17 g packet Take 17 g by mouth 2 (two) times daily. (Patient taking differently: Take 17 g by mouth daily as needed for moderate constipation.) 14 each 0   polyvinyl alcohol  (LIQUIFILM TEARS) 1.4 % ophthalmic solution Place 1 drop into both eyes as needed for dry eyes. 15 mL 0   potassium chloride  SA (KLOR-CON  M20) 20 MEQ tablet Take 1 tablet (20 mEq total)  by mouth 2 (two) times daily. 180 tablet 1   prazosin  (MINIPRESS ) 1 MG capsule Take 1  capsule (1 mg total) by mouth at bedtime. 90 capsule 0   progesterone (PROMETRIUM) 100 MG capsule Take 100 mg by mouth at bedtime.     rizatriptan  (MAXALT ) 10 MG tablet PLEASE SEE ATTACHED FOR DETAILED DIRECTIONS     sertraline  (ZOLOFT ) 100 MG tablet Take 2 tablets (200 mg total) by mouth daily. 180 tablet 0   Sodium Fluoride  (PREVIDENT  5000 BOOSTER PLUS) 1.1 % PSTE As directed tid 100 mL 3   topiramate  (TOPAMAX ) 100 MG tablet Take 1 tablet (100 mg total) by mouth daily. 90 tablet 0   XIFAXAN 550 MG TABS tablet Take 550 mg by mouth 3 (three) times daily.     No current facility-administered medications for this visit.     Psychiatric Specialty Exam: Review of Systems  There were no vitals taken for this visit.There is no height or weight on file to calculate BMI.  General Appearance: Fairly Groomed  Eye Contact:  Fair  Speech:  Clear and Coherent  Volume:  Normal  Mood:  Depressed, Hopeless, and Worthless  Affect:  Congruent and Tearful  Thought Process:  Coherent  Orientation:  Full (Time, Place, and Person)  Thought Content: Logical   Suicidal Thoughts:  No  Homicidal Thoughts:  No  Memory:  Immediate;   Fair  Judgement:  Fair  Insight:  Lacking  Psychomotor Activity:  Decreased  Concentration:  Concentration: Fair  Recall:  Fair  Fund of Knowledge: Fair  Language: Good  Akathisia:  No    AIMS (if indicated): not done  Assets:  Communication Skills Desire for Improvement Financial Resources/Insurance Housing Social Support  ADL's:  Intact  Cognition: WNL  Sleep:  Poor   Metabolic Disorder Labs: Lab Results  Component Value Date   HGBA1C 4.9 12/20/2022   MPG 93.93 12/20/2022   MPG 93.93 07/06/2021   No results found for: PROLACTIN Lab Results  Component Value Date   CHOL 208 (H) 02/24/2014   TRIG 81.0 02/24/2014   HDL 54.90 02/24/2014   CHOLHDL 4 02/24/2014   VLDL 16.2 02/24/2014   LDLCALC 137 (H) 02/24/2014   Lab Results  Component Value Date   TSH  0.18 (L) 03/20/2023   TSH 0.175 (L) 12/21/2022    Therapeutic Level Labs: No results found for: LITHIUM No results found for: VALPROATE No results found for: CBMZ   Screenings: AIMS    Flowsheet Row [facility]  AIMS Total Score 0   AUDIT    Flowsheet Row Ad[facility] (Dis[facility]  Alcohol  Use Disorder Identification Test Final Score (AUDIT) 1   GAD-7    Flowsheet Row Office Visit from 03/20/2023 in [facility] [place] Counselor from 01/09/2023 in [facility] PARTIAL [facility]PITAL[facility]ATION PROGRAM Office Visit from 11/22/2022 in [facility]  Total GAD-7 Score 7 15 12    PHQ2-9    Flowsheet Row Office Visit from 07/30/2023 in [facility] [place] Counselor from 06/14/2023 in [facility]AL [facility] INTENSIVE [facility]YCH Counselor from 06/08/2023 in [facility] PARTIAL [facility]OSPITALIZATION PROGRAM Counselor from 06/01/2023 in BEHAVIORAL HEALTH PARTIAL HOSPITALIZATION PROGRAM Counselor from 05/03/2023 in BEHAVIORAL HEALTH PARTIAL HOSPITALIZATION PROGRAM  PHQ-2 Total Score 0 2 2 2 4   PHQ-9 Total Score -- 9 6 17 14    Advertising copywriter from  06/14/2023 in BEHAVIORAL HEALTH INTENSIVE PSYCH Counselor from 06/01/2023 in BEHAVIORAL HEALTH PARTIAL HOSPITALIZATION PROGRAM Counselor from 05/03/2023 in BEHAVIORAL HEALTH PARTIAL HOSPITALIZATION PROGRAM  C-SSRS RISK CATEGORY Error: Question 6 not populated No Risk Error: Question 2 not populated    Collaboration of Care: Collaboration of Care: Medication Management AEB medication prescription  Patient/Guardian was advised Release of Information must be obtained prior to any record release in order to collaborate their care with an outside provider. Patient/Guardian was advised if they have not already done so to contact the registration  department to sign all necessary forms in order for us  to release information regarding their care.   Consent: Patient/Guardian gives verbal consent for treatment and assignment of benefits for services provided during this visit. Patient/Guardian expressed understanding and agreed to proceed.    Arvella CHRISTELLA Finder, MD 02/12/2024, 8:04 AM   Virtual Visit via Video Note  I connected with Emily Park on 02/12/24 at  1:30 PM EDT by a video enabled telemedicine application and verified that I am speaking with the correct person using two identifiers.  Location: Patient: Home Provider: Home Office   I discussed the limitations of evaluation and management by telemedicine and the availability of in person appointments. The patient expressed understanding and agreed to proceed.   I discussed the assessment and treatment plan with the patient. The patient was provided an opportunity to ask questions and all were answered. The patient agreed with the plan and demonstrated an understanding of the instructions.   The patient was advised to call back or seek an in-person evaluation if the symptoms worsen or if the condition fails to improve as anticipated.  I provided 20 minutes of non-face-to-face time during this encounter.   Arvella CHRISTELLA Finder, MD

## 2024-02-12 ENCOUNTER — Ambulatory Visit: Payer: Self-pay

## 2024-02-12 ENCOUNTER — Encounter (HOSPITAL_COMMUNITY): Admitting: Psychiatry

## 2024-02-12 NOTE — Progress Notes (Signed)
 This encounter was created in error - please disregard.  Patient was contacted via telephone.  She had canceled the appointment and rescheduled earlier this morning.  She is scheduled to start the IOP on 6/26.  Patient notes that she was able to start Latuda  and has not had any adverse side effects in the interim.

## 2024-02-12 NOTE — Telephone Encounter (Signed)
 FYI Only or Action Required?: FYI only for provider  Patient was last seen in primary care on 07/30/2023 by Plotnikov, Oakley Bellman, MD. Called Nurse Triage reporting Pain. Symptoms began Thursday. Interventions attempted: OTC medications: tylenol  & increase water intake. Symptoms are: unchanged.  Triage Disposition: See PCP When Office is Open (Within 3 Days)  Patient/caregiver understands and will follow disposition?: Yes   Copied from CRM [id number]. Topic: Clinical - Red Word Triage >> Feb 12, 2024 11:41 AM Cruzita Dopp I wrote: Red Word that prompted transfer to Nurse Triage: Patient fell last Thursday and hit head, she has bruises and swelling on arms and head. Patient is now experiencing Fatigue, shortness of breath, dizziness, decrease appetite Reason for Disposition  [1] MILD weakness (i.e., does not interfere with ability to work, go to school, normal activities) AND [2] persists > 1 week  [1] MODERATE pain (e.g., interferes with normal activities) AND [2] present > 3 days  Answer Assessment - Initial Assessment Questions 1. ONSET: When did the muscle aches or body pains start?      Thursday 2. LOCATION: What part of your body is hurting? (e.g., entire body, arms, legs)      Rib cage area after fall 3. SEVERITY: How bad is the pain? (Scale 1-10; or mild, moderate, severe)   - MILD (1-3): doesn't interfere with normal activities    - MODERATE (4-7): interferes with normal activities or awakens from sleep    - SEVERE (8-10):  excruciating pain, unable to do any normal activities      Moderate  4. CAUSE: What do you think is causing the pains?     fall 5. FEVER: Have you been having fever?     no 6. OTHER SYMPTOMS: Do you have any other symptoms? (e.g., chest pain, weakness, rash, cold or flu symptoms, weight loss)     Weakness all over, dizziness, low appetite, brittle nails and hair 7. PREGNANCY: Is there any chance you are pregnant? When was your last menstrual period?      N/a 8. TRAVEL: Have you traveled out of the country in the last month? (e.g., travel history, exposures)     N/a  Answer Assessment - Initial Assessment Questions 1. DESCRIPTION: Describe how you are feeling.     Dizziness, lightheaded 2. SEVERITY: How bad is it?  Can you stand and walk?   - MILD (0-3): Feels weak or tired, but does not interfere with work, school or normal activities.   - MODERATE (4-7): Able to stand and walk; weakness interferes with work, school, or normal activities.   - SEVERE (8-10): Unable to stand or walk; unable to do usual activities.     Mild to moderate 3. ONSET: When did these symptoms begin? (e.g., hours, days, weeks, months)     Ongoing and worsening  4. CAUSE: What do you think is causing the weakness or fatigue? (e.g., not drinking enough fluids, medical problem, trouble sleeping)     Low fluids, thyroid  5. NEW MEDICINES:  Have you started on any new medicines recently? (e.g., opioid pain medicines, benzodiazepines, muscle relaxants, antidepressants, antihistamines, neuroleptics, beta blockers)     N.a 6. OTHER SYMPTOMS: Do you have any other symptoms? (e.g., chest pain, fever, cough, SOB, vomiting, diarrhea, bleeding, other areas of pain)     SOB at times 7. PREGNANCY: Is there any chance you are pregnant? When was your last menstrual period?     N/a  Protocols used: Muscle Aches and Body Pain-A-AH, Weakness (Generalized) and  Fatigue-A-AH

## 2024-02-14 ENCOUNTER — Encounter: Payer: Self-pay | Admitting: Internal Medicine

## 2024-02-14 NOTE — Progress Notes (Unsigned)
 Subjective:    Patient ID: Emily Park, female    DOB: 05-01-1971, 53 y.o.   MRN: [id number]      HPI Emily Park is here for No chief complaint on file.   Weakness -      Medications and allergies reviewed with patient and updated if appropriate.  Current Outpatient Medications on File Prior to Visit  Medication Sig Dispense Refill   ACCRUFER  30 MG CAPS Take 1 capsule (30 mg total) by mouth 2 (two) times daily. 90 capsule 0   aspirin  EC 81 MG tablet Take 1 tablet (81 mg total) by mouth daily.     Cholecalciferol (VITAMIN D3) 50 MCG (2000 UT) capsule Take 2,000 Units by mouth daily.     clonazePAM  (KLONOPIN ) 0.5 MG tablet Take 1 tablet (0.5 mg total) by mouth daily as needed for anxiety. 30 tablet 0   doxepin  (SINEQUAN ) 75 MG capsule Take 1 capsule (75 mg total) by mouth at bedtime. 90 capsule 0   gabapentin  (NEURONTIN ) 300 MG capsule Take 1 capsule (300 mg total) by mouth 3 (three) times daily as needed. 270 capsule 0   HYDROcodone -acetaminophen  (NORCO/VICODIN) 5-325 MG tablet Take 1 tablet by mouth every 6 (six) hours as needed for severe pain (pain score 7-10). 20 tablet 0   hydrocortisone  (ANUSOL -HC) 2.5 % rectal cream Apply 1 Application topically 4 (four) times daily as needed for hemorrhoids. 30 g 0   LINZESS  145 MCG CAPS capsule TAKE 1 CAPSULE (145 MCG TOTAL) BY MOUTH ONCE DAILY 90 capsule 3   lurasidone  (LATUDA ) 40 MG TABS tablet Take 1 tablet (40 mg total) by mouth daily with supper. 30 tablet 2   LYLLANA 0.05 MG/24HR patch Place 1 patch onto the skin.     Multiple Vitamin (MULTIVITAMIN WITH MINERALS) TABS tablet Take 1 tablet by mouth every morning.     pantoprazole  (PROTONIX ) 40 MG tablet Take 1 tablet (40 mg total) by mouth 2 (two) times daily. 180 tablet 3   polyethylene glycol (MIRALAX  / GLYCOLAX ) 17 g packet Take 17 g by mouth 2 (two) times daily. (Patient taking differently: Take 17 g by mouth daily as needed for moderate constipation.) 14 each 0   polyvinyl  alcohol  (LIQUIFILM TEARS) 1.4 % ophthalmic solution Place 1 drop into both eyes as needed for dry eyes. 15 mL 0   potassium chloride  SA (KLOR-CON  M20) 20 MEQ tablet Take 1 tablet (20 mEq total) by mouth 2 (two) times daily. 180 tablet 1   prazosin  (MINIPRESS ) 1 MG capsule Take 1 capsule (1 mg total) by mouth at bedtime. 90 capsule 0   progesterone (PROMETRIUM) 100 MG capsule Take 100 mg by mouth at bedtime.     rizatriptan  (MAXALT ) 10 MG tablet PLEASE SEE ATTACHED FOR DETAILED DIRECTIONS     sertraline  (ZOLOFT ) 100 MG tablet Take 2 tablets (200 mg total) by mouth daily. 180 tablet 0   Sodium Fluoride  (PREVIDENT  5000 BOOSTER PLUS) 1.1 % PSTE As directed tid 100 mL 3   topiramate  (TOPAMAX ) 100 MG tablet Take 1 tablet (100 mg total) by mouth daily. 90 tablet 0   XIFAXAN 550 MG TABS tablet Take 550 mg by mouth 3 (three) times daily.     No current facility-administered medications on file prior to visit.    Review of Systems     Objective:  There were no vitals filed for this visit. BP Readings from Last 3 Encounters:  07/30/23 102/64  07/19/23 94/60  03/20/23 130/86   Wt  Readings from Last 3 Encounters:  07/30/23 115 lb (52.2 kg)  07/19/23 118 lb 9.6 oz (53.8 kg)  03/20/23 120 lb (54.4 kg)   There is no height or weight on file to calculate BMI.    Physical Exam         Assessment & Plan:    See Problem List for Assessment and Plan of chronic medical problems.

## 2024-02-14 NOTE — Patient Instructions (Incomplete)
      Blood work was ordered.       Medications changes include :   None    A referral was ordered and someone will call you to schedule an appointment.     No follow-ups on file.

## 2024-02-15 ENCOUNTER — Ambulatory Visit: Admitting: Internal Medicine

## 2024-02-15 ENCOUNTER — Ambulatory Visit (INDEPENDENT_AMBULATORY_CARE_PROVIDER_SITE_OTHER)

## 2024-02-15 ENCOUNTER — Telehealth: Admitting: Physician Assistant

## 2024-02-15 VITALS — BP 100/70 | HR 60 | Temp 97.6°F | Ht 64.0 in | Wt 112.0 lb

## 2024-02-15 DIAGNOSIS — S2249XA Multiple fractures of ribs, unspecified side, initial encounter for closed fracture: Secondary | ICD-10-CM

## 2024-02-15 DIAGNOSIS — W19XXXA Unspecified fall, initial encounter: Secondary | ICD-10-CM

## 2024-02-15 DIAGNOSIS — M2550 Pain in unspecified joint: Secondary | ICD-10-CM | POA: Insufficient documentation

## 2024-02-15 DIAGNOSIS — R739 Hyperglycemia, unspecified: Secondary | ICD-10-CM | POA: Diagnosis not present

## 2024-02-15 DIAGNOSIS — R55 Syncope and collapse: Secondary | ICD-10-CM

## 2024-02-15 DIAGNOSIS — R7989 Other specified abnormal findings of blood chemistry: Secondary | ICD-10-CM | POA: Diagnosis not present

## 2024-02-15 DIAGNOSIS — R0781 Pleurodynia: Secondary | ICD-10-CM | POA: Diagnosis not present

## 2024-02-15 DIAGNOSIS — E876 Hypokalemia: Secondary | ICD-10-CM

## 2024-02-15 DIAGNOSIS — D508 Other iron deficiency anemias: Secondary | ICD-10-CM

## 2024-02-15 DIAGNOSIS — N1831 Chronic kidney disease, stage 3a: Secondary | ICD-10-CM

## 2024-02-15 DIAGNOSIS — R682 Dry mouth, unspecified: Secondary | ICD-10-CM | POA: Diagnosis not present

## 2024-02-15 LAB — IBC PANEL
Iron: 29 ug/dL — ABNORMAL LOW (ref 42–145)
Saturation Ratios: 8 % — ABNORMAL LOW (ref 20.0–50.0)
TIBC: 364 ug/dL (ref 250.0–450.0)
Transferrin: 260 mg/dL (ref 212.0–360.0)

## 2024-02-15 LAB — CBC WITH DIFFERENTIAL/PLATELET
Basophils Absolute: 0.1 10*3/uL (ref 0.0–0.1)
Basophils Relative: 0.9 % (ref 0.0–3.0)
Eosinophils Absolute: 0.3 10*3/uL (ref 0.0–0.7)
Eosinophils Relative: 4 % (ref 0.0–5.0)
HCT: 35 % — ABNORMAL LOW (ref 36.0–46.0)
Hemoglobin: 11.4 g/dL — ABNORMAL LOW (ref 12.0–15.0)
Lymphocytes Relative: 43.6 % (ref 12.0–46.0)
Lymphs Abs: 3.5 10*3/uL (ref 0.7–4.0)
MCHC: 32.7 g/dL (ref 30.0–36.0)
MCV: 85.7 fl (ref 78.0–100.0)
Monocytes Absolute: 0.4 10*3/uL (ref 0.1–1.0)
Monocytes Relative: 5.5 % (ref 3.0–12.0)
Neutro Abs: 3.7 10*3/uL (ref 1.4–7.7)
Neutrophils Relative %: 46 % (ref 43.0–77.0)
Platelets: 431 10*3/uL — ABNORMAL HIGH (ref 150.0–400.0)
RBC: 4.08 Mil/uL (ref 3.87–5.11)
RDW: 17.1 % — ABNORMAL HIGH (ref 11.5–15.5)
WBC: 8 10*3/uL (ref 4.0–10.5)

## 2024-02-15 LAB — COMPREHENSIVE METABOLIC PANEL WITH GFR
ALT: 7 U/L (ref 0–35)
AST: 18 U/L (ref 0–37)
Albumin: 4.8 g/dL (ref 3.5–5.2)
Alkaline Phosphatase: 67 U/L (ref 39–117)
BUN: 23 mg/dL (ref 6–23)
CO2: 24 meq/L (ref 19–32)
Calcium: 9.9 mg/dL (ref 8.4–10.5)
Chloride: 98 meq/L (ref 96–112)
Creatinine, Ser: 1.46 mg/dL — ABNORMAL HIGH (ref 0.40–1.20)
GFR: 40.61 mL/min — ABNORMAL LOW (ref >60.00)
Glucose, Bld: 77 mg/dL (ref 70–99)
Potassium: 2.9 meq/L — ABNORMAL LOW (ref 3.5–5.1)
Sodium: 134 meq/L — ABNORMAL LOW (ref 135–145)
Total Bilirubin: 0.3 mg/dL (ref 0.2–1.2)
Total Protein: 8.1 g/dL (ref 6.0–8.3)

## 2024-02-15 LAB — HEMOGLOBIN A1C: Hgb A1c MFr Bld: 5.6 % (ref 4.6–6.5)

## 2024-02-15 LAB — TSH: TSH: 0.45 u[IU]/mL (ref 0.35–5.50)

## 2024-02-15 LAB — C-REACTIVE PROTEIN: CRP: <1 mg/dL (ref 0.5–20.0)

## 2024-02-15 LAB — SEDIMENTATION RATE: Sed Rate: 42 mm/h — ABNORMAL HIGH (ref 0–30)

## 2024-02-15 LAB — FERRITIN: Ferritin: 15.7 ng/mL (ref 10.0–291.0)

## 2024-02-15 NOTE — Progress Notes (Signed)
 Virtual Visit Consent   Emily Park [facility], you are scheduled for a virtual visit with a [facility] provider today. Just as with appointments in the office, your consent must be obtained to participate. Your consent will be active for this visit and any virtual visit you may have with one of our providers in the next 365 days. If you have a MyChart account, a copy of this consent can be sent to you electronically.  As this is a virtual visit, video technology does not allow for your provider to perform a traditional examination. This may limit your provider's ability to fully assess your condition. If your provider identifies any concerns that need to be evaluated in person or the need to arrange testing (such as labs, EKG, etc.), we will make arrangements to do so. Although advances in technology are sophisticated, we cannot ensure that it will always work on either your end or our end. If the connection with a video visit is poor, the visit may have to be switched to a telephone visit. With either a video or telephone visit, we are not always able to ensure that we have a secure connection.  By engaging in this virtual visit, you consent to the provision of healthcare and authorize for your insurance to be billed (if applicable) for the services provided during this visit. Depending on your insurance coverage, you may receive a charge related to this service.  I need to obtain your verbal consent now. Are you willing to proceed with your visit today? Emily Park has provided verbal consent on 02/15/2024 for a virtual visit (video or telephone). Emily Park, [place]  Date: 02/15/2024 7:09 PM   Virtual Visit via Video Note   I, Emily Park, connected with  Emily Park  ([id number], 1971/01/28) on 02/15/24 at  7:15 PM EDT by a video-enabled telemedicine application and verified that I am speaking with the correct person using two identifiers.  Location: Patient: Virtual Visit Location Patient:  Home Provider: Virtual Visit Location Provider: Home Office   I discussed the limitations of evaluation and management by telemedicine and the availability of in person appointments. The patient expressed understanding and agreed to proceed.    History of Present Illness: Emily Park is a 53 y.o. who identifies as a female who was assigned female at birth, and is being seen today for rib fractures after fall.  HPI: Fall The accident occurred 5 to 7 days ago. The fall occurred in unknown circumstances. She landed on Hard floor. The point of impact was the head.    Problems:  Patient Active Problem List   Diagnosis Date Noted   Hyperglycemia 02/15/2024   Arthralgia 02/15/2024   Rib pain on left side 02/15/2024   Hematoma of buttock 07/19/2023   Post-traumatic stress disorder, chronic 05/11/2023   GAD (generalized anxiety disorder) 05/03/2023   Bereavement, uncomplicated 05/03/2023   MDD (major depressive disorder), recurrent episode, moderate (HCC) 05/03/2023   Insomnia 03/20/2023   MDD (major depressive disorder), recurrent severe, without psychosis (HCC) 12/28/2022   CKD stage 3a, GFR 45-59 ml/min (HCC) 12/25/2022   Chronic anemia 12/25/2022   Major depressive disorder, recurrent episode, severe (HCC) 12/25/2022   Generalized anxiety disorder 12/24/2022   Hypoglycemia 12/21/2022   Thyroid  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   IBS (irritable bowel syndrome) 10/03/2022   Acute encephalopathy 06/06/2022   High anion gap metabolic acidosis 06/06/2022   Complete rectal prolapse 07/07/2021  Anemia, iron  deficiency 05/13/2021   Hypokalemia 05/13/2021   Diarrhea 04/14/2021   Intractable vomiting with nausea 04/14/2021   UTI (urinary tract infection) 04/14/2021   Sepsis (HCC) 04/13/2021   Atypical squamous cells of undetermined significance on cytologic smear of cervix (ASC-US ) 02/24/2021   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   Anxiety  02/24/2021   Heart murmur 02/24/2021   Menorrhagia 02/24/2021   Thyroid  nodule 10/26/2020   Acute renal failure superimposed on stage 2 chronic kidney disease (HCC)    Hypotension    Hypovolemic shock (HCC) 08/29/2020   Rectal prolapse 04/05/2020   Migraine 02/27/2019   Mild episode of recurrent major depressive disorder (HCC) 10/25/2018   Abnormal thyroid  stimulating hormone (TSH) level 10/24/2018   Syncope and collapse 10/10/2018   Lung nodules 11/14/2015   Paroxysmal SVT (supraventricular tachycardia) (HCC) 11/13/2015   Attention deficit hyperactivity disorder (ADHD) 11/13/2015    Allergies:  Allergies  Allergen Reactions   Augmentin  [Amoxicillin -Pot Clavulanate]     Stomach pain   Zithromax [Azithromycin] Rash   Medications:  Current Outpatient Medications:    ACCRUFER  30 MG CAPS, Take 1 capsule (30 mg total) by mouth 2 (two) times daily., Disp: 90 capsule, Rfl: 0   aspirin  EC 81 MG tablet, Take 1 tablet (81 mg total) by mouth daily., Disp: , Rfl:    Cholecalciferol (VITAMIN D3) 50 MCG (2000 UT) capsule, Take 2,000 Units by mouth daily., Disp: , Rfl:    clonazePAM  (KLONOPIN ) 0.5 MG tablet, Take 1 tablet (0.5 mg total) by mouth daily as needed for anxiety. (Patient not taking: Reported on 02/15/2024), Disp: 30 tablet, Rfl: 0   clonazePAM  (KLONOPIN ) 1 MG tablet, Take 1 mg by mouth at bedtime., Disp: , Rfl:    doxepin  (SINEQUAN ) 100 MG capsule, SMARTSIG:1 Capsule(s) By Mouth Every Evening, Disp: , Rfl:    doxepin  (SINEQUAN ) 75 MG capsule, Take 1 capsule (75 mg total) by mouth at bedtime., Disp: 90 capsule, Rfl: 0   gabapentin  (NEURONTIN ) 300 MG capsule, Take 1 capsule (300 mg total) by mouth 3 (three) times daily as needed., Disp: 270 capsule, Rfl: 0   HYDROcodone -acetaminophen  (NORCO/VICODIN) 5-325 MG tablet, Take 1 tablet by mouth every 6 (six) hours as needed for severe pain (pain score 7-10)., Disp: 20 tablet, Rfl: 0   hydrocortisone  (ANUSOL -HC) 2.5 % rectal cream, Apply 1  Application topically 4 (four) times daily as needed for hemorrhoids., Disp: 30 g, Rfl: 0   LINZESS  145 MCG CAPS capsule, TAKE 1 CAPSULE (145 MCG TOTAL) BY MOUTH ONCE DAILY, Disp: 90 capsule, Rfl: 3   lurasidone  (LATUDA ) 40 MG TABS tablet, Take 1 tablet (40 mg total) by mouth daily with supper., Disp: 30 tablet, Rfl: 2   LYLLANA 0.05 MG/24HR patch, Place 1 patch onto the skin., Disp: , Rfl:    Multiple Vitamin (MULTIVITAMIN WITH MINERALS) TABS tablet, Take 1 tablet by mouth every morning., Disp: , Rfl:    pantoprazole  (PROTONIX ) 40 MG tablet, Take 1 tablet (40 mg total) by mouth 2 (two) times daily., Disp: 180 tablet, Rfl: 3   polyethylene glycol (MIRALAX  / GLYCOLAX ) 17 g packet, Take 17 g by mouth 2 (two) times daily. (Patient taking differently: Take 17 g by mouth daily as needed for moderate constipation.), Disp: 14 each, Rfl: 0   polyvinyl alcohol  (LIQUIFILM TEARS) 1.4 % ophthalmic solution, Place 1 drop into both eyes as needed for dry eyes., Disp: 15 mL, Rfl: 0   potassium chloride  SA (KLOR-CON  M20) 20 MEQ tablet, Take 1 tablet (20  mEq total) by mouth 2 (two) times daily., Disp: 180 tablet, Rfl: 1   prazosin  (MINIPRESS ) 1 MG capsule, Take 1 capsule (1 mg total) by mouth at bedtime., Disp: 90 capsule, Rfl: 0   progesterone (PROMETRIUM) 100 MG capsule, Take 100 mg by mouth at bedtime., Disp: , Rfl:    rizatriptan  (MAXALT ) 10 MG tablet, PLEASE SEE ATTACHED FOR DETAILED DIRECTIONS, Disp: , Rfl:    sertraline  (ZOLOFT ) 100 MG tablet, Take 2 tablets (200 mg total) by mouth daily., Disp: 180 tablet, Rfl: 0   Sodium Fluoride  (PREVIDENT  5000 BOOSTER PLUS) 1.1 % PSTE, As directed tid, Disp: 100 mL, Rfl: 3   topiramate  (TOPAMAX ) 100 MG tablet, Take 1 tablet (100 mg total) by mouth daily., Disp: 90 tablet, Rfl: 0   XIFAXAN 550 MG TABS tablet, Take 550 mg by mouth 3 (three) times daily., Disp: , Rfl:   Observations/Objective: Patient is well-developed, well-nourished in no acute distress.  Resting  comfortably  at home.  Head is normocephalic, atraumatic.  No labored breathing.  Speech is clear and coherent with logical content.  Patient is alert and oriented at baseline.    Assessment and Plan: 1. Closed fracture of multiple ribs, unspecified laterality, initial encounter (Primary)  Patient presenting with left sided rib pain and inability to take a deep breath after a fall 1 week prior. X ray reviewed with mildly displaced left sided rib fractures. Patient in need of pain management and incentive spirometer to prevent possible PNA due to fractures after fall. Instructed to present to the nearest ER which she agreed to do. All questions answered.   Follow Up Instructions: I discussed the assessment and treatment plan with the patient. The patient was provided an opportunity to ask questions and all were answered. The patient agreed with the plan and demonstrated an understanding of the instructions.  A copy of instructions were sent to the patient via MyChart unless otherwise noted below.    The patient was advised to call back or seek an in-person evaluation if the symptoms worsen or if the condition fails to improve as anticipated.    Emily Settle, PA-C

## 2024-02-15 NOTE — Patient Instructions (Signed)
 Walda Guiles Bies, thank you for joining Marciana Settle, PA-C for today's virtual visit.  While this provider is not your primary care provider (PCP), if your PCP is located in our provider database this encounter information will be shared with them immediately following your visit.   A [facility] MyChart account gives you access to today's visit and all your visits, tests, and labs performed at [facility]  click here if you don't have a [facility] MyChart account or go to [email or web address]  Consent: (Patient) Emily Park provided verbal consent for this virtual visit at the beginning of the encounter.  Current Medications:  Current Outpatient Medications:    ACCRUFER  30 MG CAPS, Take 1 capsule (30 mg total) by mouth 2 (two) times daily., Disp: 90 capsule, Rfl: 0   aspirin  EC 81 MG tablet, Take 1 tablet (81 mg total) by mouth daily., Disp: , Rfl:    Cholecalciferol (VITAMIN D3) 50 MCG (2000 UT) capsule, Take 2,000 Units by mouth daily., Disp: , Rfl:    clonazePAM  (KLONOPIN ) 0.5 MG tablet, Take 1 tablet (0.5 mg total) by mouth daily as needed for anxiety. (Patient not taking: Reported on 02/15/2024), Disp: 30 tablet, Rfl: 0   clonazePAM  (KLONOPIN ) 1 MG tablet, Take 1 mg by mouth at bedtime., Disp: , Rfl:    doxepin  (SINEQUAN ) 100 MG capsule, SMARTSIG:1 Capsule(s) By Mouth Every Evening, Disp: , Rfl:    doxepin  (SINEQUAN ) 75 MG capsule, Take 1 capsule (75 mg total) by mouth at bedtime., Disp: 90 capsule, Rfl: 0   gabapentin  (NEURONTIN ) 300 MG capsule, Take 1 capsule (300 mg total) by mouth 3 (three) times daily as needed., Disp: 270 capsule, Rfl: 0   HYDROcodone -acetaminophen  (NORCO/VICODIN) 5-325 MG tablet, Take 1 tablet by mouth every 6 (six) hours as needed for severe pain (pain score 7-10)., Disp: 20 tablet, Rfl: 0   hydrocortisone  (ANUSOL -HC) 2.5 % rectal cream, Apply 1 Application topically 4 (four) times daily as needed for hemorrhoids., Disp: 30 g, Rfl: 0    LINZESS  145 MCG CAPS capsule, TAKE 1 CAPSULE (145 MCG TOTAL) BY MOUTH ONCE DAILY, Disp: 90 capsule, Rfl: 3   lurasidone  (LATUDA ) 40 MG TABS tablet, Take 1 tablet (40 mg total) by mouth daily with supper., Disp: 30 tablet, Rfl: 2   LYLLANA 0.05 MG/24HR patch, Place 1 patch onto the skin., Disp: , Rfl:    Multiple Vitamin (MULTIVITAMIN WITH MINERALS) TABS tablet, Take 1 tablet by mouth every morning., Disp: , Rfl:    pantoprazole  (PROTONIX ) 40 MG tablet, Take 1 tablet (40 mg total) by mouth 2 (two) times daily., Disp: 180 tablet, Rfl: 3   polyethylene glycol (MIRALAX  / GLYCOLAX ) 17 g packet, Take 17 g by mouth 2 (two) times daily. (Patient taking differently: Take 17 g by mouth daily as needed for moderate constipation.), Disp: 14 each, Rfl: 0   polyvinyl alcohol  (LIQUIFILM TEARS) 1.4 % ophthalmic solution, Place 1 drop into both eyes as needed for dry eyes., Disp: 15 mL, Rfl: 0   potassium chloride  SA (KLOR-CON  M20) 20 MEQ tablet, Take 1 tablet (20 mEq total) by mouth 2 (two) times daily., Disp: 180 tablet, Rfl: 1   prazosin  (MINIPRESS ) 1 MG capsule, Take 1 capsule (1 mg total) by mouth at bedtime., Disp: 90 capsule, Rfl: 0   progesterone (PROMETRIUM) 100 MG capsule, Take 100 mg by mouth at bedtime., Disp: , Rfl:    rizatriptan  (MAXALT ) 10 MG tablet, PLEASE SEE ATTACHED FOR DETAILED DIRECTIONS, Disp: , Rfl:  sertraline  (ZOLOFT ) 100 MG tablet, Take 2 tablets (200 mg total) by mouth daily., Disp: 180 tablet, Rfl: 0   Sodium Fluoride  (PREVIDENT  5000 BOOSTER PLUS) 1.1 % PSTE, As directed tid, Disp: 100 mL, Rfl: 3   topiramate  (TOPAMAX ) 100 MG tablet, Take 1 tablet (100 mg total) by mouth daily., Disp: 90 tablet, Rfl: 0   XIFAXAN 550 MG TABS tablet, Take 550 mg by mouth 3 (three) times daily., Disp: , Rfl:    Medications ordered in this encounter:  No orders of the defined types were placed in this encounter.    *If you need refills on other medications prior to your next appointment, please contact  your pharmacy*  Follow-Up: Call back or seek an in-person evaluation if the symptoms worsen or if the condition fails to improve as anticipated.  [facility] [phone]  Other Instructions Present to nearest ER for evaluation.    If you have been instructed to have an in-person evaluation today at a local Urgent Care facility, please use the link below. It will take you to a list of all of our available [facility], including address, phone number and hours of operation. Please do not delay care.  [facility]  If you or a family member do not have a primary care provider, use the link below to schedule a visit and establish care. When you choose a [facility] primary care physician or advanced practice provider, you gain a long-term partner in health. Find a Primary Care Provider  Learn more about [facility] in-office and virtual care options: [facility] - Get Care Now

## 2024-02-16 ENCOUNTER — Ambulatory Visit: Payer: Self-pay | Admitting: Internal Medicine

## 2024-02-16 MED ORDER — POTASSIUM CHLORIDE CRYS ER 20 MEQ PO TBCR: 40.0000 meq | EXTENDED_RELEASE_TABLET | Freq: Two times a day (BID) | ORAL | 1 refills | Status: DC

## 2024-02-18 LAB — ANTI-SCLERODERMA ANTIBODY: Scleroderma (Scl-70) (ENA) Antibody, IgG: <1 AI

## 2024-02-18 LAB — RHEUMATOID FACTOR: Rheumatoid fact SerPl-aCnc: <10 [IU]/mL (ref <14)

## 2024-02-18 LAB — ANTI-NUCLEAR AB-TITER (ANA TITER): ANA Titer 1: 1:40 {titer} — ABNORMAL HIGH

## 2024-02-18 LAB — ANA: Anti Nuclear Antibody (ANA): POSITIVE — AB

## 2024-02-18 LAB — CYCLIC CITRUL PEPTIDE ANTIBODY, IGG: Cyclic Citrullin Peptide Ab: 21 U — ABNORMAL HIGH

## 2024-02-18 MED ORDER — HYDROCODONE-ACETAMINOPHEN 5-325 MG PO TABS: 1.0000 | ORAL_TABLET | Freq: Four times a day (QID) | ORAL | 0 refills | Status: AC | PRN

## 2024-02-21 ENCOUNTER — Telehealth (HOSPITAL_COMMUNITY): Payer: Self-pay | Admitting: Psychiatry

## 2024-02-21 ENCOUNTER — Telehealth (HOSPITAL_COMMUNITY): Admitting: Psychiatry

## 2024-02-21 ENCOUNTER — Other Ambulatory Visit (HOSPITAL_COMMUNITY): Attending: Psychiatry

## 2024-02-21 ENCOUNTER — Encounter (HOSPITAL_COMMUNITY): Payer: Self-pay

## 2024-02-21 NOTE — Telephone Encounter (Signed)
 D:  Pt called the MH-IOP Case Mgr back.  Left vm stating she had fallen and broke two ribs.  It hurst to breathe deep, laugh, and cough.  Pt would like to start virtual MH-IOP next Monday 02-25-24.  A:  Schedule pt to start on 02-25-24.  Informed Dr. Carvin and treatment team.  R:  Pt receptive.

## 2024-02-21 NOTE — Telephone Encounter (Signed)
 D:  Pt was scheduled to start virtual MH-IOP today but no showed and didn't call.  A:  Placed call to patient but there was no answer.  Left vm requesting pt to call the case manager back.  Inform Dr. Carvin and treatment team.

## 2024-02-21 NOTE — Telephone Encounter (Signed)
 D:  Placed second call to pt, but there was no answer.  A:  Inform Dr. Carvin and treatment team.

## 2024-02-22 ENCOUNTER — Other Ambulatory Visit (HOSPITAL_COMMUNITY): Payer: Self-pay | Admitting: Psychiatry

## 2024-02-22 ENCOUNTER — Other Ambulatory Visit (HOSPITAL_COMMUNITY)

## 2024-02-22 ENCOUNTER — Telehealth: Payer: Self-pay | Admitting: Nurse Practitioner

## 2024-02-22 DIAGNOSIS — F411 Generalized anxiety disorder: Secondary | ICD-10-CM

## 2024-02-22 DIAGNOSIS — G43909 Migraine, unspecified, not intractable, without status migrainosus: Secondary | ICD-10-CM

## 2024-02-22 DIAGNOSIS — F332 Major depressive disorder, recurrent severe without psychotic features: Secondary | ICD-10-CM

## 2024-02-22 NOTE — Telephone Encounter (Signed)
 Emily Park and Emily Park called in to schedule an appointment. After looking through the history I informed them that we will have to return their call on Monday due to provider absence as they requested to be seen by Emily Park. I obligated myself to get confirmation from Emily Park and Emily Park to confirm if we can re-schedule Emily Park due to 4 cancellations and 2 No shows. Emily Park expressed that they did not show in the beginning of the year due to funerals.

## 2024-02-24 ENCOUNTER — Other Ambulatory Visit (HOSPITAL_COMMUNITY): Payer: Self-pay | Admitting: Psychiatry

## 2024-02-24 DIAGNOSIS — F332 Major depressive disorder, recurrent severe without psychotic features: Secondary | ICD-10-CM

## 2024-02-24 DIAGNOSIS — F411 Generalized anxiety disorder: Secondary | ICD-10-CM

## 2024-02-25 ENCOUNTER — Telehealth (HOSPITAL_COMMUNITY): Payer: Self-pay | Admitting: Psychiatry

## 2024-02-25 ENCOUNTER — Other Ambulatory Visit (HOSPITAL_COMMUNITY)

## 2024-02-25 NOTE — Telephone Encounter (Signed)
 D:  Pt phoned and stated that she couldn't start virtual MH-IOP today d/t an appointment.  Pt requesting to start tomorrow.  A:  Inform the treatment team and Dr. Carvin.  R:  Pt receptive.

## 2024-02-26 ENCOUNTER — Other Ambulatory Visit (HOSPITAL_COMMUNITY): Attending: Psychiatry | Admitting: Psychiatry

## 2024-02-26 ENCOUNTER — Telehealth (HOSPITAL_COMMUNITY): Payer: Self-pay | Admitting: Psychiatry

## 2024-02-26 DIAGNOSIS — F332 Major depressive disorder, recurrent severe without psychotic features: Secondary | ICD-10-CM | POA: Insufficient documentation

## 2024-02-26 DIAGNOSIS — Z634 Disappearance and death of family member: Secondary | ICD-10-CM | POA: Insufficient documentation

## 2024-02-26 DIAGNOSIS — Z638 Other specified problems related to primary support group: Secondary | ICD-10-CM | POA: Insufficient documentation

## 2024-02-26 DIAGNOSIS — F411 Generalized anxiety disorder: Secondary | ICD-10-CM | POA: Insufficient documentation

## 2024-02-26 DIAGNOSIS — G8929 Other chronic pain: Secondary | ICD-10-CM | POA: Insufficient documentation

## 2024-02-26 DIAGNOSIS — Z79899 Other long term (current) drug therapy: Secondary | ICD-10-CM | POA: Insufficient documentation

## 2024-02-26 NOTE — Telephone Encounter (Signed)
 D:  Pt didn't show nor call the MH-IOP.  Pt was due to start yesterday but had an appointment; therefore she asked to wait and start today.  A:  Placed call and left pt a vm to call the case manager back.  Inform treatment team and Dr. Carvin.

## 2024-02-27 ENCOUNTER — Other Ambulatory Visit (HOSPITAL_COMMUNITY): Admitting: Psychiatry

## 2024-02-27 ENCOUNTER — Telehealth (HOSPITAL_COMMUNITY): Payer: Self-pay | Admitting: Psychiatry

## 2024-02-27 NOTE — Telephone Encounter (Signed)
 D:  Placed call to pt, since she no showed and didn't call the MH-IOP case manager.  A:  Left vm for pt to inform cm if she's planning to start MH-IOP after the holiday or not.  Inform treatment team and Dr. Carvin.

## 2024-02-28 ENCOUNTER — Telehealth: Payer: Self-pay | Admitting: Nurse Practitioner

## 2024-02-28 ENCOUNTER — Other Ambulatory Visit (HOSPITAL_COMMUNITY)

## 2024-02-28 ENCOUNTER — Ambulatory Visit: Admitting: Internal Medicine

## 2024-02-28 NOTE — Telephone Encounter (Signed)
 Emily Park called in on behalf of Emily Park. Emily Park mentioned that Emily Park has just been laying around and that she has some questionable lab work. I have made Emily Park and Emily Park aware that we are giving them one mor chance after speaking with Emily Park and Emily Park. They have been made aware of our No show policy. Emily Park is scheduled to see Emily Park on 7/7.

## 2024-03-02 NOTE — Progress Notes (Unsigned)
 [facility]   [phone] [phone]    Patient Care Team: Plotnikov, Karlynn GAILS, MD as PCP - General (Internal Medicine) Sheldon Standing, MD as Consulting Physician (Colon and Rectal Surgery) Elicia Claw, MD as Consulting Physician (Gastroenterology) Voncile Gang, MD (Gastroenterology) Vincente Grip, MD as Consulting Physician (Psychiatry)   CHIEF COMPLAINT: Re-establish care for iron  deficiency anemia   CURRENT THERAPY: IV iron  PRN  INTERVAL HISTORY Ms. Emily Park presents back to re-establish care, last seen by my colleague Johnston Police, PA 02/21/22 for IDA  ROS   Past Medical History:  Diagnosis Date   Abnormal TSH    ADD (attention deficit disorder)    Anemia    Anxiety    Arrhythmia    Decreased libido    Depression    Heart murmur    Hyperlipidemia    Hypokalemia    Migraine    Nephrolithiasis    Pap smear abnormality of cervix/human papillomavirus (HPV) positive    Sleep difficulties    SVT (supraventricular tachycardia) (HCC)      Past Surgical History:  Procedure Laterality Date   ABLATION     Cardiac ablation, endometrial ablation   BREAST LUMPECTOMY     right breast   BREAST LUMPECTOMY Right    CARDIOVERSION     COLONOSCOPY WITH PROPOFOL  N/A 05/16/2021   Procedure: COLONOSCOPY WITH PROPOFOL ;  Surgeon: Dianna Specking, MD;  Location: [facility];  Service: Endoscopy;  Laterality: N/A;   COLPOSCOPY     w/ cervical biopsy   DILITATION & CURRETTAGE/HYSTROSCOPY WITH NOVASURE ABLATION N/A 09/11/2014   Procedure: DILATATION & CURETTAGE/HYSTEROSCOPY WITH NOVASURE ABLATION;  Surgeon: Charlie JINNY Flowers, MD;  Location: [facility];  Service: Gynecology;  Laterality: N/A;   GIVENS CAPSULE STUDY N/A 05/16/2021   Procedure: GIVENS CAPSULE STUDY;  Surgeon: Dianna Specking, MD;  Location: [facility];  Service: Endoscopy;  Laterality: N/A;   XI ROBOT ASSISTED RECTOPEXY N/A 07/07/2021   Procedure: XI ROBOT ASSISTED RECTOPEXY;   Surgeon: Sheldon Standing, MD;  Location: [facility];  Service: General;  Laterality: N/A;   XI ROBOTIC ASSISTED LOWER ANTERIOR RESECTION N/A 07/07/2021   Procedure: XI ROBOTIC ASSISTED LOW ANTERIOR RECTOSIGMOID RESECTION WITH RIGID PROCTOSCOPY, TAP BLOCK;  Surgeon: Sheldon Standing, MD;  Location: [facility];  Service: General;  Laterality: N/A;     Outpatient Encounter Medications as of 03/03/2024  Medication Sig Note   ACCRUFER  30 MG CAPS Take 1 capsule (30 mg total) by mouth 2 (two) times daily.    aspirin  EC 81 MG tablet Take 1 tablet (81 mg total) by mouth daily.    Cholecalciferol (VITAMIN D3) 50 MCG (2000 UT) capsule Take 2,000 Units by mouth daily.    clonazePAM  (KLONOPIN ) 0.5 MG tablet Take 1 tablet (0.5 mg total) by mouth daily as needed for anxiety. (Patient not taking: Reported on 02/15/2024)    clonazePAM  (KLONOPIN ) 1 MG tablet Take 1 mg by mouth at bedtime.    doxepin  (SINEQUAN ) 100 MG capsule SMARTSIG:1 Capsule(s) By Mouth Every Evening    doxepin  (SINEQUAN ) 75 MG capsule Take 1 capsule (75 mg total) by mouth at bedtime.    gabapentin  (NEURONTIN ) 300 MG capsule Take 1 capsule (300 mg total) by mouth 3 (three) times daily as needed.    hydrocortisone  (ANUSOL -HC) 2.5 % rectal cream Apply 1 Application topically 4 (four) times daily as needed for hemorrhoids.    LINZESS  145 MCG CAPS capsule TAKE 1 CAPSULE (145 MCG TOTAL) BY MOUTH ONCE DAILY  lurasidone  (LATUDA ) 40 MG TABS tablet Take 1 tablet (40 mg total) by mouth daily with supper.    LYLLANA 0.05 MG/24HR patch Place 1 patch onto the skin.    Multiple Vitamin (MULTIVITAMIN WITH MINERALS) TABS tablet Take 1 tablet by mouth every morning.    pantoprazole  (PROTONIX ) 40 MG tablet Take 1 tablet (40 mg total) by mouth 2 (two) times daily.    polyethylene glycol (MIRALAX  / GLYCOLAX ) 17 g packet Take 17 g by mouth 2 (two) times daily. (Patient taking differently: Take 17 g by mouth daily as needed for moderate constipation.)    polyvinyl alcohol   (LIQUIFILM TEARS) 1.4 % ophthalmic solution Place 1 drop into both eyes as needed for dry eyes.    potassium chloride  SA (KLOR-CON  M20) 20 MEQ tablet Take 2 tablets (40 mEq total) by mouth 2 (two) times daily.    prazosin  (MINIPRESS ) 1 MG capsule Take 1 capsule (1 mg total) by mouth at bedtime.    progesterone (PROMETRIUM) 100 MG capsule Take 100 mg by mouth at bedtime.    rizatriptan  (MAXALT ) 10 MG tablet PLEASE SEE ATTACHED FOR DETAILED DIRECTIONS 01/26/2023: Takes only as needed   sertraline  (ZOLOFT ) 100 MG tablet Take 2 tablets (200 mg total) by mouth daily.    Sodium Fluoride  (PREVIDENT  5000 BOOSTER PLUS) 1.1 % PSTE As directed tid    topiramate  (TOPAMAX ) 100 MG tablet TAKE 1 TABLET BY MOUTH EVERY DAY    XIFAXAN 550 MG TABS tablet Take 550 mg by mouth 3 (three) times daily.    No facility-administered encounter medications on file as of 03/03/2024.     There were no vitals filed for this visit. There is no height or weight on file to calculate BMI.   ECOG PERFORMANCE STATUS: {CHL ONC ECOG PS:[id number]}  PHYSICAL EXAM GENERAL:alert, no distress and comfortable SKIN: no rash  EYES: sclera clear NECK: without mass LYMPH:  no palpable cervical or supraclavicular lymphadenopathy  LUNGS: clear with normal breathing effort HEART: regular rate & rhythm, no lower extremity edema ABDOMEN: abdomen soft, non-tender and normal bowel sounds NEURO: alert & oriented x 3 with fluent speech, no focal motor/sensory deficits Breast exam:  PAC without erythema    CBC    Latest Ref Rng & Units 02/15/2024   10:41 AM 07/30/2023   10:49 AM 07/19/2023    2:43 PM  CBC  WBC 4.0 - 10.5 K/uL 8.0  6.6  13.2   Hemoglobin 12.0 - 15.0 g/dL 88.5  87.6  89.2   Hematocrit 36.0 - 46.0 % 35.0  37.5  32.6   Platelets 150.0 - 400.0 K/uL 431.0  705.0  538.0       CMP     Latest Ref Rng & Units 02/15/2024   10:41 AM 07/30/2023   10:49 AM 07/19/2023    2:43 PM  CMP  Glucose 70 - 99 mg/dL 77  89  94   BUN 6 -  23 mg/dL 23  12  12    Creatinine 0.40 - 1.20 mg/dL 8.53  8.81  8.93   Sodium 135 - 145 mEq/L 134  134  132   Potassium 3.5 - 5.1 mEq/L 2.9  2.9  2.7   Chloride 96 - 112 mEq/L 98  99  99   CO2 19 - 32 mEq/L 24  25  23    Calcium 8.4 - 10.5 mg/dL 9.9  89.6  9.6   Total Protein 6.0 - 8.3 g/dL 8.1  8.6  7.6   Total Bilirubin 0.2 -  1.2 mg/dL 0.3  0.4  0.4   Alkaline Phos 39 - 117 U/L 67  80  81   AST 0 - 37 U/L 18  20  19    ALT 0 - 35 U/L 7  10  13        ASSESSMENT & PLAN:  PLAN:  No orders of the defined types were placed in this encounter.     All questions were answered. The patient knows to call the clinic with any problems, questions or concerns. No barriers to learning were detected. I spent *** counseling the patient face to face. The total time spent in the appointment was *** and more than 50% was on counseling, review of test results, and coordination of care.   Emily Park K Jhoana Upham, NP 03/02/2024 8:56 PM

## 2024-03-03 ENCOUNTER — Other Ambulatory Visit: Payer: Self-pay

## 2024-03-03 ENCOUNTER — Inpatient Hospital Stay (HOSPITAL_BASED_OUTPATIENT_CLINIC_OR_DEPARTMENT_OTHER): Admitting: Nurse Practitioner

## 2024-03-03 ENCOUNTER — Emergency Department (HOSPITAL_COMMUNITY)

## 2024-03-03 ENCOUNTER — Encounter (HOSPITAL_COMMUNITY): Payer: Self-pay | Admitting: Radiology

## 2024-03-03 ENCOUNTER — Encounter (HOSPITAL_COMMUNITY): Payer: Self-pay

## 2024-03-03 ENCOUNTER — Encounter: Payer: Self-pay | Admitting: Nurse Practitioner

## 2024-03-03 ENCOUNTER — Inpatient Hospital Stay

## 2024-03-03 ENCOUNTER — Emergency Department (HOSPITAL_COMMUNITY)
Admission: EM | Admit: 2024-03-03 | Discharge: 2024-03-03 | Disposition: A | Discharge status: Home / Self Care | Source: Ambulatory Visit | Attending: Emergency Medicine | Admitting: Emergency Medicine

## 2024-03-03 ENCOUNTER — Other Ambulatory Visit (HOSPITAL_COMMUNITY)

## 2024-03-03 VITALS — BP 69/53 | HR 120 | Temp 97.6°F | Resp 20 | Ht 64.0 in | Wt 114.4 lb

## 2024-03-03 DIAGNOSIS — Z7982 Long term (current) use of aspirin: Secondary | ICD-10-CM | POA: Diagnosis not present

## 2024-03-03 DIAGNOSIS — D509 Iron deficiency anemia, unspecified: Secondary | ICD-10-CM | POA: Insufficient documentation

## 2024-03-03 DIAGNOSIS — R55 Syncope and collapse: Secondary | ICD-10-CM | POA: Diagnosis present

## 2024-03-03 DIAGNOSIS — D508 Other iron deficiency anemias: Secondary | ICD-10-CM

## 2024-03-03 DIAGNOSIS — E878 Other disorders of electrolyte and fluid balance, not elsewhere classified: Secondary | ICD-10-CM | POA: Insufficient documentation

## 2024-03-03 DIAGNOSIS — N1831 Chronic kidney disease, stage 3a: Secondary | ICD-10-CM | POA: Insufficient documentation

## 2024-03-03 DIAGNOSIS — R682 Dry mouth, unspecified: Secondary | ICD-10-CM | POA: Insufficient documentation

## 2024-03-03 DIAGNOSIS — E860 Dehydration: Secondary | ICD-10-CM | POA: Diagnosis not present

## 2024-03-03 DIAGNOSIS — E876 Hypokalemia: Secondary | ICD-10-CM | POA: Insufficient documentation

## 2024-03-03 DIAGNOSIS — D631 Anemia in chronic kidney disease: Secondary | ICD-10-CM | POA: Insufficient documentation

## 2024-03-03 DIAGNOSIS — Z79899 Other long term (current) drug therapy: Secondary | ICD-10-CM | POA: Insufficient documentation

## 2024-03-03 DIAGNOSIS — F32A Depression, unspecified: Secondary | ICD-10-CM | POA: Insufficient documentation

## 2024-03-03 DIAGNOSIS — K259 Gastric ulcer, unspecified as acute or chronic, without hemorrhage or perforation: Secondary | ICD-10-CM | POA: Insufficient documentation

## 2024-03-03 DIAGNOSIS — K5909 Other constipation: Secondary | ICD-10-CM | POA: Insufficient documentation

## 2024-03-03 DIAGNOSIS — K3184 Gastroparesis: Secondary | ICD-10-CM | POA: Insufficient documentation

## 2024-03-03 DIAGNOSIS — R531 Weakness: Secondary | ICD-10-CM | POA: Insufficient documentation

## 2024-03-03 LAB — CBC WITH DIFFERENTIAL/PLATELET
Abs Immature Granulocytes: 0.09 K/uL — ABNORMAL HIGH (ref 0.00–0.07)
Basophils Absolute: 0.1 K/uL (ref 0.0–0.1)
Basophils Relative: 1 %
Eosinophils Absolute: 0.5 K/uL (ref 0.0–0.5)
Eosinophils Relative: 4 %
HCT: 40.5 % (ref 36.0–46.0)
Hemoglobin: 12.4 g/dL (ref 12.0–15.0)
Immature Granulocytes: 1 %
Lymphocytes Relative: 20 %
Lymphs Abs: 2.6 K/uL (ref 0.7–4.0)
MCH: 27.6 pg (ref 26.0–34.0)
MCHC: 30.6 g/dL (ref 30.0–36.0)
MCV: 90 fL (ref 80.0–100.0)
Monocytes Absolute: 0.8 K/uL (ref 0.1–1.0)
Monocytes Relative: 6 %
Neutro Abs: 9 K/uL — ABNORMAL HIGH (ref 1.7–7.7)
Neutrophils Relative %: 68 %
Platelets: 417 K/uL — ABNORMAL HIGH (ref 150–400)
RBC: 4.5 MIL/uL (ref 3.87–5.11)
RDW: 15.4 % (ref 11.5–15.5)
WBC: 13 K/uL — ABNORMAL HIGH (ref 4.0–10.5)
nRBC: 0 % (ref 0.0–0.2)

## 2024-03-03 LAB — URINALYSIS, ROUTINE W REFLEX MICROSCOPIC
Bilirubin Urine: NEGATIVE
Glucose, UA: NEGATIVE mg/dL
Hgb urine dipstick: NEGATIVE
Ketones, ur: NEGATIVE mg/dL
Leukocytes,Ua: NEGATIVE
Nitrite: NEGATIVE
Protein, ur: NEGATIVE mg/dL
Specific Gravity, Urine: 1.004 — ABNORMAL LOW (ref 1.005–1.030)
pH: 6 (ref 5.0–8.0)

## 2024-03-03 LAB — CBC WITH DIFFERENTIAL (CANCER CENTER ONLY)
Abs Immature Granulocytes: 0.05 K/uL (ref 0.00–0.07)
Basophils Absolute: 0.1 K/uL (ref 0.0–0.1)
Basophils Relative: 1 %
Eosinophils Absolute: 0.7 K/uL — ABNORMAL HIGH (ref 0.0–0.5)
Eosinophils Relative: 6 %
HCT: 40.8 % (ref 36.0–46.0)
Hemoglobin: 13.3 g/dL (ref 12.0–15.0)
Immature Granulocytes: 1 %
Lymphocytes Relative: 24 %
Lymphs Abs: 2.6 K/uL (ref 0.7–4.0)
MCH: 28.1 pg (ref 26.0–34.0)
MCHC: 32.6 g/dL (ref 30.0–36.0)
MCV: 86.1 fL (ref 80.0–100.0)
Monocytes Absolute: 0.6 K/uL (ref 0.1–1.0)
Monocytes Relative: 5 %
Neutro Abs: 6.8 K/uL (ref 1.7–7.7)
Neutrophils Relative %: 63 %
Platelet Count: 437 K/uL — ABNORMAL HIGH (ref 150–400)
RBC: 4.74 MIL/uL (ref 3.87–5.11)
RDW: 15.4 % (ref 11.5–15.5)
WBC Count: 10.7 K/uL — ABNORMAL HIGH (ref 4.0–10.5)
nRBC: 0 % (ref 0.0–0.2)

## 2024-03-03 LAB — CMP (CANCER CENTER ONLY)
ALT: 9 U/L (ref 0–44)
AST: 17 U/L (ref 15–41)
Albumin: 4.9 g/dL (ref 3.5–5.0)
Alkaline Phosphatase: 81 U/L (ref 38–126)
Anion gap: 10 (ref 5–15)
BUN: 25 mg/dL — ABNORMAL HIGH (ref 6–20)
CO2: 19 mmol/L — ABNORMAL LOW (ref 22–32)
Calcium: 10 mg/dL (ref 8.9–10.3)
Chloride: 103 mmol/L (ref 98–111)
Creatinine: 1.88 mg/dL — ABNORMAL HIGH (ref 0.44–1.00)
GFR, Estimated: 31 mL/min — ABNORMAL LOW (ref >60)
Glucose, Bld: 115 mg/dL — ABNORMAL HIGH (ref 70–99)
Potassium: 3.4 mmol/L — ABNORMAL LOW (ref 3.5–5.1)
Sodium: 132 mmol/L — ABNORMAL LOW (ref 135–145)
Total Bilirubin: 0.3 mg/dL (ref 0.0–1.2)
Total Protein: 8.4 g/dL — ABNORMAL HIGH (ref 6.5–8.1)

## 2024-03-03 LAB — IRON AND IRON BINDING CAPACITY (CC-WL,HP ONLY)
Iron: 39 ug/dL (ref 28–170)
Saturation Ratios: 9 % — ABNORMAL LOW (ref 10.4–31.8)
TIBC: 428 ug/dL (ref 250–450)
UIBC: 389 ug/dL (ref 148–442)

## 2024-03-03 LAB — MAGNESIUM: Magnesium: 1.9 mg/dL (ref 1.7–2.4)

## 2024-03-03 LAB — TROPONIN I (HIGH SENSITIVITY)
Troponin I (High Sensitivity): <2 ng/L (ref <18)
Troponin I (High Sensitivity): <2 ng/L (ref <18)

## 2024-03-03 LAB — FOLATE: Folate: 38.7 ng/mL (ref >5.9)

## 2024-03-03 LAB — FERRITIN: Ferritin: 17 ng/mL (ref 11–307)

## 2024-03-03 LAB — VITAMIN B12: Vitamin B-12: 454 pg/mL (ref 180–914)

## 2024-03-03 MED ORDER — LACTATED RINGERS IV BOLUS
1000.0000 mL | Freq: Once | INTRAVENOUS | Status: AC
  Administered 2024-03-03: 1000 mL via INTRAVENOUS

## 2024-03-03 NOTE — Progress Notes (Signed)
 Received message from staff that patient was being evaluated by provider and found to be significantly hypotensive and tachycardic.  Per Lacie Burton, NP - patient is to undergo further evaluation in the ED. ED Charge called and patient transferred to RES. A via wheelchair. Patient's husband present at time of transfer.  Mallie Combes, PA-C supported in the transfer.  Bedside report given to RN.

## 2024-03-03 NOTE — Discharge Instructions (Addendum)
 It appears that you already had a placed order for cardiology.  They should reach out to the next few days to schedule an appointment.  If not, please make sure you call to schedule.  Your lab work indicated that you are pretty dehydrated today.  Please make sure that you are drinking plenty of fluids and staying well-hydrated at home.  Please follow-up with your PCP in the next few days for reevaluation.  I have included more information into the discharge paperwork for you to follow-up with.  If you have any concerns for new or worsening symptoms, please return to your nearest emergency department for evaluation.  Contact a doctor if: You have episodes of near fainting. Get help right away if: You pass out or faint. You hit your head or are injured after fainting. You have any of these symptoms: Fast or uneven heartbeats (palpitations). Pain in your chest, belly, or back. Shortness of breath. You have jerky movements that you cannot control (seizure). You have a very bad headache. You are confused. You have problems with how you see (vision). You are very weak. You have trouble walking. You are bleeding from your mouth or your butt (rectum). You have black or tarry poop (stool). These symptoms may be an emergency. Get help right away. Call your local emergency services (911 in the [place] Do not wait to see if the symptoms will go away. Do not drive yourself to the hospital.

## 2024-03-03 NOTE — ED Provider Notes (Signed)
 [facility] Provider Note   CSN: [id number] Arrival date & time: 03/03/24  1216     Patient presents with: Hypotension   Emily Park is a 53 y.o. female with h/o syncope, IDA, presents to the ER for evaluation of syncopal episode earlier this morning. The patient reports that she was feeling fatigued yesterday and was having decrease in PO intake. She reports that she is being worked up for her fatigue and has been referred to a rheumatologist. She reports that she was feeling lightheaded/woozy this morning when she stood up to walk to put tea from the refrigerator. She reports that she woke up on the floor. Her husband was in the house and heard the fall. He reports she was shouting his name shortly after. She did not have any loss of bowel or bladder. No mouth pain or tongue pain/injury. Currently, she feels fatigue, but denies any bodily pain, headache, or neck pain. Denies any unilateral weakness, fever, dysuria, or recent cough or cold symptoms. She did not have any chest pain or SOB before the syncope, not did she have any after. She denies any trouble walking or trouble talking. Her only complaint is feeling fatigued, which has been chronic for her. She was at the cancer center for her iron  when they noticed her blood pressure was low and sent her to the ER. They have referred her to cardiology because of her long standing syncopal history.  HPI     Prior to Admission medications   Medication Sig Start Date End Date Taking? Authorizing Provider  aspirin  EC 81 MG tablet Take 1 tablet (81 mg total) by mouth daily. 08/02/23 08/01/24  Plotnikov, Aleksei V, MD  Cholecalciferol (VITAMIN D3) 50 MCG (2000 UT) capsule Take 2,000 Units by mouth daily.    [provider]  clonazePAM  (KLONOPIN ) 0.5 MG tablet Take 1 tablet (0.5 mg total) by mouth daily as needed for anxiety. Patient not taking: Reported on 02/15/2024 02/01/24 01/31/25  Carvin Arvella CHRISTELLA, MD  doxepin  (SINEQUAN ) 100 MG capsule SMARTSIG:1 Capsule(s) By Mouth Every Evening 02/11/24   [provider]  gabapentin  (NEURONTIN ) 300 MG capsule Take 1 capsule (300 mg total) by mouth 3 (three) times daily as needed. 12/20/23 03/19/24  Carvin Arvella CHRISTELLA, MD  LINZESS  145 MCG CAPS capsule TAKE 1 CAPSULE (145 MCG TOTAL) BY MOUTH ONCE DAILY 10/15/23   Plotnikov, Aleksei V, MD  lurasidone  (LATUDA ) 40 MG TABS tablet Take 1 tablet (40 mg total) by mouth daily with supper. 02/01/24   Carvin Arvella CHRISTELLA, MD  LYLLANA 0.05 MG/24HR patch Place 1 patch onto the skin. 07/11/23   [provider]  Multiple Vitamin (MULTIVITAMIN WITH MINERALS) TABS tablet Take 1 tablet by mouth every morning.    [provider]  pantoprazole  (PROTONIX ) 40 MG tablet Take 1 tablet (40 mg total) by mouth 2 (two) times daily. 03/20/23   Plotnikov, Aleksei V, MD  polyethylene glycol (MIRALAX  / GLYCOLAX ) 17 g packet Take 17 g by mouth 2 (two) times daily. Patient taking differently: Take 17 g by mouth daily as needed for moderate constipation. 07/10/21   Ezenduka, Nkeiruka J, MD  polyvinyl alcohol  (LIQUIFILM TEARS) 1.4 % ophthalmic solution Place 1 drop into both eyes as needed for dry eyes. 12/28/22   Rosario Leatrice FERNS, MD  potassium chloride  SA (KLOR-CON  M20) 20 MEQ tablet Take 2 tablets (40 mEq total) by mouth 2 (two) times daily. 02/16/24   Geofm Glade PARAS, MD  prazosin  (MINIPRESS ) 1 MG capsule Take 1 capsule (1 mg total) by mouth at bedtime. 12/20/23   Carvin Arvella HERO, MD  progesterone (PROMETRIUM) 100 MG capsule Take 100 mg by mouth at bedtime. 06/21/23   [provider]  sertraline  (ZOLOFT ) 100 MG tablet Take 2 tablets (200 mg total) by mouth daily. 02/01/24 05/01/24  Carvin Arvella HERO, MD  Sodium Fluoride  (PREVIDENT  5000 BOOSTER PLUS) 1.1 % PSTE As directed tid 03/20/23   Plotnikov, Aleksei V, MD  topiramate  (TOPAMAX ) 100 MG tablet TAKE 1 TABLET BY MOUTH EVERY DAY 02/22/24   Carvin Arvella HERO, MD    Allergies:  Augmentin  [amoxicillin -pot clavulanate] and Zithromax [azithromycin]    Review of Systems  Constitutional:  Positive for fatigue. Negative for chills and fever.  Respiratory:  Negative for cough and shortness of breath.   Cardiovascular:  Negative for chest pain, palpitations and leg swelling.  Gastrointestinal:  Negative for abdominal pain, constipation, diarrhea, nausea and vomiting.  Genitourinary:  Negative for dysuria and hematuria.  Musculoskeletal:  Negative for back pain.  Neurological:  Positive for syncope and light-headedness. Negative for dizziness, speech difficulty and headaches.    Updated Vital Signs BP 101/71   Pulse 95   Temp 97.7 F (36.5 C) (Oral)   Resp 13   SpO2 100%   Physical Exam Vitals and nursing note reviewed.  Constitutional:      Appearance: She is not toxic-appearing.  HENT:     Head: Normocephalic and atraumatic.     Comments: Nontender, no step off or deformities. No battle signs or raccoon eyes.     Mouth/Throat:     Mouth: Mucous membranes are dry.     Comments: Significantly dry MM with dry teeth as well.  Eyes:     General: No scleral icterus.    Extraocular Movements: Extraocular movements intact.     Pupils: Pupils are equal, round, and reactive to light.  Neck:     Comments: Full ROM without pain. No midline or paraspinal tenderness to palpation. No step off or deformity.  Cardiovascular:     Rate and Rhythm: Normal rate and regular rhythm.     Pulses: Normal pulses.  Pulmonary:     Effort: Pulmonary effort is normal. No respiratory distress.     Breath sounds: Normal breath sounds.  Abdominal:     Palpations: Abdomen is soft.     Tenderness: There is no abdominal tenderness. There is no guarding or rebound.  Musculoskeletal:        General: No tenderness.     Cervical back: Normal range of motion. No tenderness.     Right lower leg: No edema.     Left lower leg: No edema.  Skin:    General: Skin is warm and dry.   Neurological:     General: No focal deficit present.     Mental Status: She is alert. Mental status is at baseline.     GCS: GCS eye subscore is 4. GCS verbal subscore is 5. GCS motor subscore is 6.     Cranial Nerves: No cranial nerve deficit, dysarthria or facial asymmetry.     Sensory: No sensory deficit.     Motor: No weakness or pronator drift.     Coordination: Finger-Nose-Finger Test normal.     (all labs ordered are listed, but only abnormal results are displayed) Labs Reviewed  URINALYSIS, ROUTINE W REFLEX MICROSCOPIC - Abnormal; Notable for the following components:      Result Value  Color, Urine STRAW (*)    Specific Gravity, Urine 1.004 (*)    All other components within normal limits  CBC WITH DIFFERENTIAL/PLATELET - Abnormal; Notable for the following components:   WBC 13.0 (*)    Platelets 417 (*)    Neutro Abs 9.0 (*)    Abs Immature Granulocytes 0.09 (*)    All other components within normal limits  COMPREHENSIVE METABOLIC PANEL WITH GFR  TROPONIN I (HIGH SENSITIVITY)  TROPONIN I (HIGH SENSITIVITY)    EKG: EKG Interpretation Date/Time:  Monday March 03 2024 12:37:39 EDT Ventricular Rate:  90 PR Interval:    QRS Duration:  104 QT Interval:  362 QTC Calculation: 443 R Axis:   25  Text Interpretation: Atrial fibrillation Borderline T wave abnormalities Confirmed by Jerrol Agent ([id number]) on 03/03/2024 2:53:42 PM  Radiology: CT Head Wo Contrast Result Date: 03/03/2024 CLINICAL DATA:  Status post trauma. EXAM: CT HEAD WITHOUT CONTRAST TECHNIQUE: Contiguous axial images were obtained from the base of the skull through the vertex without intravenous contrast. RADIATION DOSE REDUCTION: This exam was performed according to the departmental dose-optimization program which includes automated exposure control, adjustment of the mA and/or kV according to patient size and/or use of iterative reconstruction technique. COMPARISON:  June 06, 2022 FINDINGS: Brain: No  evidence of acute infarction, hemorrhage, hydrocephalus, extra-axial collection or mass lesion/mass effect. Vascular: No hyperdense vessel or unexpected calcification. Skull: Normal. Negative for fracture or focal lesion. Sinuses/Orbits: No acute finding. Other: None. IMPRESSION: No acute intracranial pathology. Electronically Signed   By: Suzen Dials M.D.   On: 03/03/2024 16:14   DG Chest 2 View Result Date: 03/03/2024 CLINICAL DATA:  Syncope. EXAM: CHEST - 2 VIEW COMPARISON:  Chest radiograph dated 02/15/2024. FINDINGS: No focal consolidation, pleural effusion, pneumothorax the cardiac silhouette is within normal limits. No acute osseous pathology. IMPRESSION: No active cardiopulmonary disease. Electronically Signed   By: Vanetta Chou M.D.   On: 03/03/2024 13:43   Procedures   Medications Ordered in the ED  lactated ringers  bolus 1,000 mL (0 mLs Intravenous Stopped 03/03/24 1807)  lactated ringers  bolus 1,000 mL (0 mLs Intravenous Stopped 03/03/24 1807)                               Medical Decision Making Amount and/or Complexity of Data Reviewed Labs: ordered. Radiology: ordered.   53 y.o. female presents to the ER for evaluation of syncope. Differential diagnosis includes but is not limited to CVA, ACS, arrhythmia, vasovagal / orthostatic hypotension, sepsis, hypoglycemia, electrolyte disturbance, respiratory failure, anemia, dehydration, heat injury, polypharmacy, malignancy, anxiety/panic attack. Vital signs unremarkable other than temp at 97.76F. Physical exam as noted above.   On chart evaluation, she has multiple mentions of syncopal events even back to 2020, appears she had low BP with some as well. Labs were drawn at the [facility] before arrival, however, I have added on a troponin and urinalysis. It appears her BP was in the 60s systolic on arrival to the [facility], however she is normotensive here without intervention. She was sent over to the ER as they could not work  her in for IVF at the infusion center. She has no focal neurodeficit. Moving all extremities. Palpable DP, PT, and radial pulses 2+bilaterally in extremities. Doubt any dissection, anuerysm, or stroke. She has no chest pain complaints, nor complaint of SOB. This likely is some dehydration causing syncope.   I independently reviewed and interpreted the  patient's labs. Troponin <2 with repeat of <2. CMP shows sodium 132, potassium 3 of 4, bicarb at 19 with glucose of 115 creatinine is elevated at 1.88 appears patient's baseline is between 1.18 and 1.46.  Patient's BUN is mildly elevated at 25.  Total protein 8.4.  No other electrolyte or LFT abnormality.  Patient is not having any neck pain.  She does not have any neck or head pain upon palpation.  Will order head CT but does not meet criteria for CT of cervical spine.  Will order chest x-ray given syncopal episode as well.  She denies any chest pain or shortness of breath.  On cardiac monitoring, does not show any atrial fibrillation while present in the room.  Her lab work is consistent with AKI.  Patient does appear volume down.  Will replenish with 2 L of fluids and reevaluate.  CT of head shows no acute intracranial pathology. Per radiologist's interpretation.    Chest x-ray shows no active cardiopulmonary process. Per radiologist's interpretation.    My attending assessed the patient at bedside and agrees with workup.   On reevaluation, patient reports that she feeling much better after the IV fluids.  She is sitting up in bed.  She has eaten [employer] and would like to be discharged home.  I see that the cancer center has already sent in a referral to the cardiologist and marked her urgently which I agree with given the patient's longstanding history of syncope we will need to have this worked up further.  I see the EKG mentions atrial fibrillation however was not present with the patient on monitor.  CHA2DS2-VASc score is 1, so does not meet any  criteria for anticoagulation currently.  The patient is not having any nausea or vomiting, nor did she originally, and is tolerating p.o. in the room.  Do feel like she can replenish her volume at home with continuing drinking fluids.  Have recommended electrolyte powders and slow movements with sitting to standing. Her BP is 101/71 which appears to be around her baseline in comparison to her previous appointments. Her orthostatic vital signs improved. She is stable for discharge home with close outpatient follow up.   We discussed the results of the labs/imaging. The plan is follow up with cardiology, stay hydrated, strict return precautions. We discussed strict return precautions and red flag symptoms. The patient verbalized their understanding and agrees to the plan. The patient is stable and being discharged home in good condition.  I discussed this case with my attending physician who cosigned this note including patient's presenting symptoms, physical exam, and planned diagnostics and interventions. Attending physician stated agreement with plan or made changes to plan which were implemented.   Attending physician assessed patient at bedside.  Portions of this report may have been transcribed using voice recognition software. Every effort was made to ensure accuracy; however, inadvertent computerized transcription errors may be present.    Final diagnoses:  Dehydration  Syncope, unspecified syncope type    ED Discharge Orders     None          Bernis Ernst, DEVONNA 03/07/24 2233    Jerrol Agent, MD 03/08/24 1109

## 2024-03-03 NOTE — ED Triage Notes (Signed)
 Pt brought over from cancer center for hypotension and tachycardia.  Pt has cardiac hx. 69/53 BP  HR in 120s  Pt is A&O x 4 with labs drawn at cancer center

## 2024-03-03 NOTE — ED Notes (Signed)
 Orthostatics  108/67 HR 84 106/76 HR 90 101/71 HR 95 105/74 HR 103

## 2024-03-04 ENCOUNTER — Other Ambulatory Visit (HOSPITAL_COMMUNITY)

## 2024-03-04 ENCOUNTER — Telehealth: Payer: Self-pay

## 2024-03-04 ENCOUNTER — Telehealth (HOSPITAL_COMMUNITY): Payer: Self-pay | Admitting: Psychiatry

## 2024-03-04 NOTE — Telephone Encounter (Signed)
 D:  Pt phoned the case manager for MH-IOP.  States she went to the emergency room yesterday d/t low iron .  Would like to try to attend group tomorrow.  A:  Provided pt with support.  Pt is scheduled to start virtual MH-IOP tomorrow.  Inform treatment team and Dr. Carvin.  R:  Pt receptive.

## 2024-03-04 NOTE — Telephone Encounter (Signed)
 Emily Park, patient will be scheduled as soon as possible.  Auth Submission: NO AUTH NEEDED Site of care: Site of care: [facility] Payer: [employer] Medication & CPT/J Code(s) submitted: Venofer  (Iron  Sucrose) J1756 Diagnosis Code:  Route of submission (phone, fax, portal):  Phone # Fax # Auth type: Buy/Bill PB Units/visits requested: 400mg  x 2 doses Reference number:  Approval from: 03/04/24 to 06/04/24

## 2024-03-05 ENCOUNTER — Other Ambulatory Visit (HOSPITAL_COMMUNITY): Admitting: Psychiatry

## 2024-03-05 ENCOUNTER — Encounter (HOSPITAL_COMMUNITY): Payer: Self-pay

## 2024-03-05 ENCOUNTER — Encounter (HOSPITAL_COMMUNITY): Payer: Self-pay | Admitting: Psychiatry

## 2024-03-05 DIAGNOSIS — F411 Generalized anxiety disorder: Secondary | ICD-10-CM | POA: Diagnosis not present

## 2024-03-05 DIAGNOSIS — Z638 Other specified problems related to primary support group: Secondary | ICD-10-CM | POA: Diagnosis not present

## 2024-03-05 DIAGNOSIS — Z634 Disappearance and death of family member: Secondary | ICD-10-CM | POA: Diagnosis not present

## 2024-03-05 DIAGNOSIS — Z79899 Other long term (current) drug therapy: Secondary | ICD-10-CM | POA: Diagnosis not present

## 2024-03-05 DIAGNOSIS — F332 Major depressive disorder, recurrent severe without psychotic features: Secondary | ICD-10-CM

## 2024-03-05 DIAGNOSIS — G8929 Other chronic pain: Secondary | ICD-10-CM | POA: Diagnosis not present

## 2024-03-05 NOTE — Progress Notes (Signed)
 Psychiatric Initial Adult Assessment   Patient Identification: Emily Park MRN:  [id number] Date of Evaluation:  03/05/2024 Referral Source: *** Chief Complaint:   Chief Complaint  Patient presents with   Depression   Anxiety   Stress   Trauma   Visit Diagnosis: No diagnosis found.  History of Present Illness:  ***  Associated Signs/Symptoms: Depression Symptoms:  {DEPRESSION SYMPTOMS:20000} (Hypo) Manic Symptoms:  {BHH MANIC SYMPTOMS:22872} Anxiety Symptoms:  {BHH ANXIETY SYMPTOMS:22873} Psychotic Symptoms:  {BHH PSYCHOTIC SYMPTOMS:22874} PTSD Symptoms: {BHH PTSD SYMPTOMS:22875}  Past Psychiatric History: ***  Previous Psychotropic Medications: {YES/NO:21197}  Substance Abuse History in the last 12 months:  {yes no:314532}  Consequences of Substance Abuse: {BHH CONSEQUENCES OF SUBSTANCE ABUSE:22880}  Past Medical History:  Past Medical History:  Diagnosis Date   Abnormal TSH    ADD (attention deficit disorder)    Anemia    Anxiety    Arrhythmia    Decreased libido    Depression    Heart murmur    Hyperlipidemia    Hypokalemia    Migraine    Nephrolithiasis    Pap smear abnormality of cervix/human papillomavirus (HPV) positive    Sleep difficulties    SVT (supraventricular tachycardia) (HCC)     Past Surgical History:  Procedure Laterality Date   ABLATION     Cardiac ablation, endometrial ablation   BREAST LUMPECTOMY     right breast   BREAST LUMPECTOMY Right    CARDIOVERSION     COLONOSCOPY WITH PROPOFOL  N/A 05/16/2021   Procedure: COLONOSCOPY WITH PROPOFOL ;  Surgeon: Dianna Specking, MD;  Location: [facility];  Service: Endoscopy;  Laterality: N/A;   COLPOSCOPY     w/ cervical biopsy   DILITATION & CURRETTAGE/HYSTROSCOPY WITH NOVASURE ABLATION N/A 09/11/2014   Procedure: DILATATION & CURETTAGE/HYSTEROSCOPY WITH NOVASURE ABLATION;  Surgeon: Charlie JINNY Flowers, MD;  Location: [facility];  Service: Gynecology;  Laterality: N/A;   GIVENS CAPSULE STUDY  N/A 05/16/2021   Procedure: GIVENS CAPSULE STUDY;  Surgeon: Dianna Specking, MD;  Location: [facility];  Service: Endoscopy;  Laterality: N/A;   XI ROBOT ASSISTED RECTOPEXY N/A 07/07/2021   Procedure: XI ROBOT ASSISTED RECTOPEXY;  Surgeon: Sheldon Standing, MD;  Location: [facility];  Service: General;  Laterality: N/A;   XI ROBOTIC ASSISTED LOWER ANTERIOR RESECTION N/A 07/07/2021   Procedure: XI ROBOTIC ASSISTED LOW ANTERIOR RECTOSIGMOID RESECTION WITH RIGID PROCTOSCOPY, TAP BLOCK;  Surgeon: Sheldon Standing, MD;  Location: [facility];  Service: General;  Laterality: N/A;    Family Psychiatric History: ***  Family History:  Family History  Problem Relation Age of Onset   Hypothyroidism Mother    Depression Mother    Cancer Mother        thyroid    Thyroid  disease Mother    Alcohol  abuse Father    Hypertension Father    Heart attack Father        Age 49   AAA (abdominal aortic aneurysm) Father    Depression Sister    Hypothyroidism Sister    Hyperthyroidism Sister    Diabetes Maternal Grandfather    Depression Maternal Grandmother    Breast cancer Paternal Grandmother    Depression Cousin     Social History:   Social History   Socioeconomic History   Marital status: Married    Spouse name: Not on file   Number of children: 2   Years of education: college   Highest education level: Associate degree: academic program  Occupational History   Occupation: Charity fundraiser with [employer]  Employer: [employer]   Tobacco Use   Smoking status: Never    Passive exposure: Current   Smokeless tobacco: Never  Vaping Use   Vaping status: Never Used  Substance and Sexual Activity   Alcohol  use: Not Currently    Alcohol /week: 2.0 standard drinks of alcohol     Types: 2 Glasses of wine per week   Drug use: Not Currently   Sexual activity: Yes    Partners: Male    Comment: Married  Other Topics Concern   Not on file  Social History Narrative   Lives at home with two sons.   Right-handed.   60 ounces of tea  and soda per day.   Social Drivers of Health   Financial Resource Strain: Low Risk  (10/16/2018)   Received from [facility] visits prior to 10/28/2022.   Overall Financial Resource Strain (CARDIA)    Difficulty of Paying Living Expenses: Not hard at all  Food Insecurity: Food Insecurity Present (12/28/2022)   Hunger Vital Sign    Worried About Running Out of Food in the Last Year: Sometimes true    Ran Out of Food in the Last Year: Sometimes true  Transportation Needs: No Transportation Needs (12/28/2022)   PRAPARE - Administrator, Civil Service (Medical): No    Lack of Transportation (Non-Medical): No  Physical Activity: Not on file  Stress: Not on file  Social Connections: Unknown (01/12/2022)   [facility]   Social Network    Social Network: Not on file    Additional Social History: ***  Allergies:   Allergies  Allergen Reactions   Augmentin  [Amoxicillin -Pot Clavulanate]     Stomach pain   Zithromax [Azithromycin] Rash    Metabolic Disorder Labs: Lab Results  Component Value Date   HGBA1C 5.6 02/15/2024   MPG 93.93 12/20/2022   MPG 93.93 07/06/2021   No results found for: PROLACTIN Lab Results  Component Value Date   CHOL 208 (H) 02/24/2014   TRIG 81.0 02/24/2014   HDL 54.90 02/24/2014   CHOLHDL 4 02/24/2014   VLDL 16.2 02/24/2014   LDLCALC 137 (H) 02/24/2014   Lab Results  Component Value Date   TSH 0.45 02/15/2024    Therapeutic Level Labs: No results found for: LITHIUM No results found for: CBMZ No results found for: VALPROATE  Current Medications: Current Outpatient Medications  Medication Sig Dispense Refill   aspirin  EC 81 MG tablet Take 1 tablet (81 mg total) by mouth daily.     Cholecalciferol (VITAMIN D3) 50 MCG (2000 UT) capsule Take 2,000 Units by mouth daily.     clonazePAM  (KLONOPIN ) 0.5 MG tablet Take 1 tablet (0.5 mg total) by mouth daily as needed for anxiety. 30 tablet 0   doxepin   (SINEQUAN ) 100 MG capsule SMARTSIG:1 Capsule(s) By Mouth Every Evening     gabapentin  (NEURONTIN ) 300 MG capsule Take 1 capsule (300 mg total) by mouth 3 (three) times daily as needed. 270 capsule 0   LINZESS  145 MCG CAPS capsule TAKE 1 CAPSULE (145 MCG TOTAL) BY MOUTH ONCE DAILY 90 capsule 3   lurasidone  (LATUDA ) 40 MG TABS tablet Take 1 tablet (40 mg total) by mouth daily with supper. 30 tablet 2   LYLLANA 0.05 MG/24HR patch Place 1 patch onto the skin.     Multiple Vitamin (MULTIVITAMIN WITH MINERALS) TABS tablet Take 1 tablet by mouth every morning.     pantoprazole  (PROTONIX ) 40 MG tablet Take 1 tablet (40 mg total)  by mouth 2 (two) times daily. 180 tablet 3   polyethylene glycol (MIRALAX  / GLYCOLAX ) 17 g packet Take 17 g by mouth 2 (two) times daily. 14 each 0   polyvinyl alcohol  (LIQUIFILM TEARS) 1.4 % ophthalmic solution Place 1 drop into both eyes as needed for dry eyes. 15 mL 0   potassium chloride  SA (KLOR-CON  M20) 20 MEQ tablet Take 2 tablets (40 mEq total) by mouth 2 (two) times daily. 360 tablet 1   prazosin  (MINIPRESS ) 1 MG capsule Take 1 capsule (1 mg total) by mouth at bedtime. 90 capsule 0   progesterone (PROMETRIUM) 100 MG capsule Take 100 mg by mouth at bedtime.     sertraline  (ZOLOFT ) 100 MG tablet Take 2 tablets (200 mg total) by mouth daily. 180 tablet 0   Sodium Fluoride  (PREVIDENT  5000 BOOSTER PLUS) 1.1 % PSTE As directed tid 100 mL 3   topiramate  (TOPAMAX ) 100 MG tablet TAKE 1 TABLET BY MOUTH EVERY DAY 90 tablet 0   No current facility-administered medications for this visit.    Musculoskeletal: Strength & Muscle Tone: {desc; muscle tone:32375} Gait & Station: {PE GAIT ED WJUO:77474} Patient leans: {Patient Leans:21022755}  Psychiatric Specialty Exam: Review of Systems  There were no vitals taken for this visit.There is no height or weight on file to calculate BMI.  General Appearance: {Appearance:22683}  Eye Contact:  {BHH EYE CONTACT:22684}  Speech:   {Speech:22685}  Volume:  {Volume (PAA):22686}  Mood:  {BHH MOOD:22306}  Affect:  {Affect (PAA):22687}  Thought Process:  {Thought Process (PAA):22688}  Orientation:  {BHH ORIENTATION (PAA):22689}  Thought Content:  {Thought Content:22690}  Suicidal Thoughts:  {ST/HT (PAA):22692}  Homicidal Thoughts:  {ST/HT (PAA):22692}  Memory:  {BHH MEMORY:22881}  Judgement:  {Judgement (PAA):22694}  Insight:  {Insight (PAA):22695}  Psychomotor Activity:  {Psychomotor (PAA):22696}  Concentration:  {Concentration:21399}  Recall:  {BHH GOOD/FAIR/POOR:22877}  Fund of Knowledge:{BHH GOOD/FAIR/POOR:22877}  Language: {BHH GOOD/FAIR/POOR:22877}  Akathisia:  {BHH YES OR NO:22294}  Handed:  {Handed:22697}  AIMS (if indicated):  {Desc; done/not:10129}  Assets:  {Assets (PAA):22698}  ADL's:  {BHH JIO'D:77709}  Cognition: {chl bhh cognition:304700322}  Sleep:  {BHH GOOD/FAIR/POOR:22877}   Screenings: AIMS    Flowsheet Row [facility]  AIMS Total Score 0   AUDIT    Flowsheet Row Admission (Discharged[facility]  Alcohol  Use Disorder Identification Test Final Score (AUDIT) 1   GAD-7    Flowsheet Row Office Visit from 03/20/2023 in [facility] [place] Counselor from 01/09/2023 in BE[facility]AVIORAL HEALTH PARTIAL HOSPITALIZATION PROGRAM Office Visit from 11/22/2022 in [facility] [facility]  Total GAD-7 Score 7 15 12    PHQ2-9    Flowsheet Row Counselor from 03/05/2024 in [facility]AL [facility]ALTH INTENSIVE [facility] Office Visit from 02/15/2024 in [facility] [place] Office Visit from 07/30/2023 in [facility] [place] Counselor from 06/14/2023 in BEHAVIORAL HEALTH INTENSIVE PSYCH Counselor from 06/08/2023 in BEHAVIORAL HEALTH PARTIAL HOSPITALIZATION PROGRAM  PHQ-2 Total Score 4 4 0 2 2  PHQ-9 Total  Score 19 17 -- 9 6   Flowsheet Row Counselor from 03/05/2024 in BEHAVIORAL HEALTH INTENSIVE [facility] ED from 03/03/2024 in [facility] Emergency Department at [facility] Counselor from 06/14/2023 in BEHAVIORAL HEALTH INTENSIVE PSYCH  C-SSRS RISK CATEGORY Error: Question 6 not populated No Risk Error: Question 6 not populated    Assessment and Plan: ***  Collaboration  of Care: {BH OP Collaboration of Care:21014065}  Patient/Guardian was advised Release of Information must be obtained prior to any record release in order to collaborate their care with an outside provider. Patient/Guardian was advised if they have not already done so to contact the registration department to sign all necessary forms in order for us  to release information regarding their care.   Consent: Patient/Guardian gives verbal consent for treatment and assignment of benefits for services provided during this visit. Patient/Guardian expressed understanding and agreed to proceed.   Staci LOISE Kerns, NP 7/9/202510:48 AM

## 2024-03-05 NOTE — Progress Notes (Signed)
 Virtual Visit via Video Note   I connected with Emily Park on 03/05/24 at  9:00 AM EDT by a video enabled telemedicine application and verified that I am speaking with the correct person using two identifiers.   At orientation to the IOP program, Case Manager discussed the limitations of evaluation and management by telemedicine and the availability of in person appointments. The patient expressed understanding and agreed to proceed with virtual visits throughout the duration of the program.   Location:  Patient: Patient Home Provider: Home Office   History of Present Illness: MDD and GAD   Observations/Objective: Check In: Case Manager checked in with all participants to review discharge dates, insurance authorizations, work-related documents and needs from the treatment team regarding medications. Emily Park stated needs and engaged in discussion.    Initial Therapeutic Activity: Counselor facilitated a check-in with Emily Park to assess for safety, sobriety and medication compliance.  Counselor also inquired about Emily Park's current emotional ratings, as well as any significant changes in thoughts, feelings or behavior since previous check in.  Emily Park presented for session on time and was alert, oriented x5, with no evidence or self-report of active SI/HI or A/V H.  Emily Park reported compliance with medication and denied use of alcohol  or illicit substances.  Emily Park reported scores of 8/10 for depression, 6/10 for anxiety, and 6/10 for anger/irritability.  Emily Park denied any recent outbursts or panic attacks.  Emily Park reported that a struggle has been dealing with depression and anxiety related to grief, caretaking of her mother, and husband, stating "I can't get out of this trauma".  Emily Park reported that a success was reaching out for help by joining [facility] again.  Emily Park reported that her goal today is to clean her house and then do some reading.          Second Therapeutic Activity: Counselor utilized a Cabin crew  with group members today to guide discussion on topic of codependency.  This handout defined codependency as excessive emotional or psychological reliance upon someone who requires support on account of an illness or addiction.  It also explained how this issue presents in dysfunctional family systems, including behavior such as denying existence of problems, rigid boundaries on communication, strained trust, lack of individuality, and reinforcement of unhealthy coping mechanisms such as substance use.  Characteristics of co-dependent people were listed for assistance with identification, such as extreme need for approval/recognition, difficulty identifying feelings, poor communication, and more.  Members were also tasked with completing a questionnaire in order to identify signs of codependency and results were discussed afterward.  This handout also offered strategies for resolving co-dependency within one's network, including increased use of assertive communication skills in order to set appropriate boundaries.  Intervention effectiveness could not be measured, Emily Park did complete codependency questionnaire, with 17 out of 20 positive responses, but did not actively engage in discussion on the subject.    Assessment and Plan: Counselor recommends that Emily Park remain in IOP treatment to better manage mental health symptoms, ensure stability and pursue completion of treatment plan goals. Counselor recommends adherence to crisis/safety plan, taking medications as prescribed, and following up with medical professionals if any issues arise.    Follow Up Instructions: Counselor will send [employer] Teams link for session tomorrow.  Emily Park was advised to call back or seek an in-person evaluation if the symptoms worsen or if the condition fails to improve as anticipated.   Collaboration of Care:   Medication Management AEB Staci Kerns, NP  Case Manager AEB Ricka Gaskins, CNA     Patient/Guardian was advised Release of Information must be obtained prior to any record release in order to collaborate their care with an outside provider. Patient/Guardian was advised if they have not already done so to contact the registration department to sign all necessary forms in order for us  to release information regarding their care.    Consent: Patient/Guardian gives verbal consent for treatment and assignment of benefits for services provided during this visit. Patient/Guardian expressed understanding and agreed to proceed.   I provided 180 minutes of non-face-to-face time during this encounter.   Darleene Ricker, LCSW, LCAS 03/05/24

## 2024-03-05 NOTE — Progress Notes (Signed)
 Virtual Visit via Video Note  I connected with Emily Park on @TODAY @ at  9:00 AM EDT by a video enabled telemedicine application and verified that I am speaking with the correct person using two identifiers.  Location: Patient: at home Provider: at office   I discussed the limitations of evaluation and management by telemedicine and the availability of in person appointments. The patient expressed understanding and agreed to proceed.  I discussed the assessment and treatment plan with the patient. The patient was provided an opportunity to ask questions and all were answered. The patient agreed with the plan and demonstrated an understanding of the instructions.   The patient was advised to call back or seek an in-person evaluation if the symptoms worsen or if the condition fails to improve as anticipated.  I provided 30 minutes of non-face-to-face time during this encounter.   GRETTA LEVAN, M.Ed,CNA   Patient ID: Emily Park, female   DOB: [birth year], 53 y.o.   MRN: [id number] D:   As per previous admit note states:  Emily Park is a 53yo female who stepped down from virtual PHP on 06-14-23.  Pt was referred to [facility] via Dr. Carvin, for worsening depressive symptoms following the death of her father. She reports her father died by suicide via gunshot a week ago while [place] and that he is the first person she has ever lost. Other stressors include her complex medical history and being unable to work due to medical issues. She previously saw Dr. Emilio Aurora for med man for 11 years and has been diagnosed with ADHD, anxiety, depression, and OCPD. She has since started seeing Dr. Carvin within this office for med man. She endorses one suicide attempt earlier this year prior to her only psychiatric hospitalization. She denies NSSI, substance abuse, HI, AVH, and current SI. She reports a great aunt and cousin died by suicide. She cites her two good friends, husband, sons,  sisters, mother, and husband's family as her supports. She denies significant psychosocial changes since her first PHP assessment. She currently lives with her husband and her 25yo son lives with them part time. She reports complex medical history to include recent GI ulcers, IBS, anemia, and hx of rectal prolapse. She also reports she has recently been diagnosed with stage 3 kidney failure and has experienced nocturnal enuresis as a result. She states there are no firearms in her home.  Pt was referred back to virtual MH-IOP per Dr. Carvin; treatment for worsening depressive and anxiety sx's.  Pt denies SI/HI or A/V hallucinations.  C/O anhedonia, sadness, poor sleep (difficulty getting to sleep), low energy, poor appetite, decreased self-esteem, poor concentration).  Pt scored 19 on the PHQ-9.  Stressors:  1) Husband was dx with cancer on 05-03-23 and just completed chemo treatment.  2) Unresolved grief/loss issues:  04-27-23, father committed suicide.  Father's Day was very difficult.  Stepfather died in 2023/10/03.  Pt states she has been assisting with the care of her mother now.  3) Unemployed:  Pt states she is currently looking for a job.  4) Medical Issues:  Pt states she is undergoing testing to see if she has Lupus. A:  Re-oriented pt.  Pt was advised of ROI must be obtained prior to any records release in order to collaborate her care with an outside provider.  Pt was advised if she has not already done so to contact the front desk to sign all necessary forms in order  for MH-IOP to release info re: her care.  Consent:  Pt gives verbal consent for tx and assignment of benefits for services provided during this telehealth group process.  Pt expressed understanding and agreed to proceed. Collaboration of care:  Collaborate with Staci Kerns, NP AEB, Darleene Ricker, LCSW AEB. Encouraged support groups through [facility] [employer] [facility] and [employer].   Pt will improve her mood as evidenced by  being happy again, managing her mood and coping with daily stressors for 5 out of 7 days for 60 days.  R:  Pt receptive.   Ricka Gaskins, M.Ed,CNA

## 2024-03-06 ENCOUNTER — Other Ambulatory Visit (HOSPITAL_COMMUNITY)

## 2024-03-06 ENCOUNTER — Telehealth (HOSPITAL_COMMUNITY): Payer: Self-pay | Admitting: Psychiatry

## 2024-03-06 NOTE — Telephone Encounter (Signed)
 D:  Pt didn't show nor did she call MH-IOP this morning.  A:  Case manager placed call to pt, but there was no answer.  Left vm requesting pt to call cm back.  Inform treatment team and Dr. Carvin.

## 2024-03-07 ENCOUNTER — Other Ambulatory Visit (HOSPITAL_COMMUNITY)

## 2024-03-07 ENCOUNTER — Telehealth: Payer: Self-pay | Admitting: Nurse Practitioner

## 2024-03-07 ENCOUNTER — Telehealth (HOSPITAL_COMMUNITY): Payer: Self-pay | Admitting: Psychiatry

## 2024-03-07 NOTE — Telephone Encounter (Signed)
 Scheduled appointments per 7/7 los. Called and left a VM with appointment details.

## 2024-03-07 NOTE — Telephone Encounter (Signed)
 D:  Pt no showed for MH-IOP today.  A:  Placed call to pt, but there was no answer.  Inform treatment team.  Inform Dr. Carvin.

## 2024-03-10 ENCOUNTER — Other Ambulatory Visit: Payer: Self-pay

## 2024-03-10 ENCOUNTER — Other Ambulatory Visit (HOSPITAL_COMMUNITY): Admitting: Psychiatry

## 2024-03-10 ENCOUNTER — Telehealth (HOSPITAL_COMMUNITY): Payer: Self-pay | Admitting: Psychiatry

## 2024-03-10 ENCOUNTER — Other Ambulatory Visit: Payer: Self-pay | Admitting: Obstetrics and Gynecology

## 2024-03-10 DIAGNOSIS — Z1231 Encounter for screening mammogram for malignant neoplasm of breast: Secondary | ICD-10-CM

## 2024-03-10 NOTE — Telephone Encounter (Signed)
 D:  Placed call to pt, since she no showed for virtual MH-IOP today.  Someone picked up the phone and hung it up.  Upon calling pt back, it went into vm.  A:  Inform treatment team and Dr. Carvin.  Will discharge pt soon d/t non-compliancy with attendance.

## 2024-03-11 ENCOUNTER — Telehealth (HOSPITAL_COMMUNITY): Payer: Self-pay | Admitting: Psychiatry

## 2024-03-11 ENCOUNTER — Ambulatory Visit

## 2024-03-11 ENCOUNTER — Other Ambulatory Visit: Payer: Self-pay | Admitting: Nurse Practitioner

## 2024-03-11 ENCOUNTER — Ambulatory Visit: Payer: Self-pay | Admitting: Nurse Practitioner

## 2024-03-11 ENCOUNTER — Other Ambulatory Visit (HOSPITAL_COMMUNITY): Admitting: Psychiatry

## 2024-03-11 VITALS — BP 120/76 | HR 85 | Temp 97.4°F | Resp 16 | Ht 64.0 in | Wt 116.2 lb

## 2024-03-11 DIAGNOSIS — D509 Iron deficiency anemia, unspecified: Secondary | ICD-10-CM

## 2024-03-11 DIAGNOSIS — D508 Other iron deficiency anemias: Secondary | ICD-10-CM

## 2024-03-11 DIAGNOSIS — R55 Syncope and collapse: Secondary | ICD-10-CM

## 2024-03-11 MED ORDER — SODIUM CHLORIDE 0.9 % IV SOLN
400.0000 mg | Freq: Once | INTRAVENOUS | Status: AC
  Administered 2024-03-11: 400 mg via INTRAVENOUS
  Filled 2024-03-11: qty 20

## 2024-03-11 NOTE — Progress Notes (Signed)
 Diagnosis: Iron  Deficiency Anemia  Provider:  Mannam, Praveen MD  Procedure: IV Infusion  IV Type: Peripheral, IV Location: L Forearm  Venofer  (Iron  Sucrose), Dose: 400 mg  Infusion Start Time: 1328  Infusion Stop Time: 1616  Post Infusion IV Care: Patient declined observation and Peripheral IV Discontinued  Discharge: Condition: Stable, Destination: Home . AVS Declined  Performed by:  Rocky FORBES Sar, RN

## 2024-03-11 NOTE — Telephone Encounter (Signed)
 D:  Pt sent the group facilitator Fenton Ricker, [place]) an email stating she had to go to the ED d/t low iron  and blood pressure and will be going to get iron  infusions today.  Pt stated she didn't know if she could return back to group or not.  A:  Placed call to pt.  Left vm inquiring if pt could return tomorrow or if she wanted d/c or not.  Informed treatment team and Dr. Carvin.

## 2024-03-12 ENCOUNTER — Other Ambulatory Visit (HOSPITAL_COMMUNITY): Payer: Self-pay | Admitting: Psychiatry

## 2024-03-12 ENCOUNTER — Telehealth (HOSPITAL_COMMUNITY): Payer: Self-pay | Admitting: Psychiatry

## 2024-03-12 ENCOUNTER — Other Ambulatory Visit (HOSPITAL_COMMUNITY)

## 2024-03-12 DIAGNOSIS — F411 Generalized anxiety disorder: Secondary | ICD-10-CM

## 2024-03-12 NOTE — Progress Notes (Deleted)
  [facility] Intensive Outpatient Program Discharge Summary  Emily Park [id number]  Admission date: 03/05/2024 Discharge date: 03/12/2024  Reason for admission: per admission assessment note:  [name] 53 year old Caucasian female presents after referral from her primary psychiatrist Carvin.  She carries a diagnosis related to major depressive disorder, generalized anxiety disorder self-reported attention deficit disorder.  Reports she continues to struggle with unresolved grief and loss, depression and anxiety symptoms.  Reports she has been experiencing low motivation and hopelessness. Stated she is the primary caretaker for her husband and stated that her husbands help has been improving. She has multiple family stressors however states  taking things day by day.   Progress in Program Toward Treatment Goals: Not Progressing patient attended and participated in daily session x 2 sessions.  Was reported by case management that patient has been noncompliant with attendance has been a no-show for multiple days.  Attempted to follow-up and reach patient.  Patient to discharge due to noncompliance with attendance.  Keep follow-up outpatient appointments with attending psychiatrist Carvin  Progress (rationale): Discharge, no-show  Collaboration of Care: Psychiatrist AEB Carvin   Patient/Guardian was advised Release of Information must be obtained prior to any record release in order to collaborate their care with an outside provider. Patient/Guardian was advised if they have not already done so to contact the registration department to sign all necessary forms in order for us  to release information regarding their care.   Consent: Patient/Guardian gives verbal consent for treatment and assignment of benefits for services provided during this visit. Patient/Guardian expressed understanding and agreed to proceed.   Staci Kerns NP  03/12/2024

## 2024-03-12 NOTE — Patient Instructions (Signed)
 D:  Discharged patient today d/t non-compliancy with attendance.  A:  Informed treatment team and Dr. Carvin.

## 2024-03-12 NOTE — Telephone Encounter (Addendum)
 D:  Pt never returned back to virtual MH-IOP d/t medical issues.  Pt had sent an email to the group facilitator Fenton Ricker, [place]) yesterday about recent medical issues.  She also inquired about group.  Cory responded to pt's email requesting that she reach out to the case manager Waynette Gaskins, M.Ed,CNA).  Pt never responded to the email nor did she reach out to the case manager.  A:  Called pt this morning d/t her no showing nor calling the cm.  Will go ahead and discharge pt d/t non-compliancy with attendance.  Pt only attended one day (03-05-24) and missed the next five days.  Inform the treatment team and Dr. Carvin.  F/U with Dr. Carvin on 04-08-24.  Strongly encourage pt to call [facility], etc for a therapy appointment.

## 2024-03-13 ENCOUNTER — Other Ambulatory Visit (HOSPITAL_COMMUNITY)

## 2024-03-14 ENCOUNTER — Other Ambulatory Visit (HOSPITAL_COMMUNITY)

## 2024-03-17 ENCOUNTER — Ambulatory Visit: Admitting: Internal Medicine

## 2024-03-17 ENCOUNTER — Other Ambulatory Visit (HOSPITAL_COMMUNITY)

## 2024-03-18 ENCOUNTER — Telehealth: Payer: Self-pay

## 2024-03-18 ENCOUNTER — Ambulatory Visit

## 2024-03-18 ENCOUNTER — Other Ambulatory Visit (HOSPITAL_COMMUNITY)

## 2024-03-18 NOTE — Telephone Encounter (Signed)
 Call from ordering provider, Lacie Burton,  as requested. Informed of report from spouse, stating pt experienced some allergic reaction symptoms following last infusion, and after getting home. States pt had some dizziness, hypotensive, shortness of breath, and swelling and tingling below previous IV site. Pt did not wait observation period following infusion. Pt did not call provider or go to ED to report or be assessed. Provider said to check and see if pt wants to get second dose. If so, she could order pre-meds, as well as IV fluids to be ran concurrently with iron . Informed spouse, and he and pt will discuss and let infusion know.

## 2024-03-19 ENCOUNTER — Other Ambulatory Visit (HOSPITAL_COMMUNITY)

## 2024-03-19 NOTE — Telephone Encounter (Signed)
 This message was probably meant to be for Lacie Burton, NP in regard to the last thyroid  infusion.  Thank you

## 2024-03-20 ENCOUNTER — Other Ambulatory Visit (HOSPITAL_COMMUNITY)

## 2024-03-21 ENCOUNTER — Other Ambulatory Visit (HOSPITAL_COMMUNITY)

## 2024-03-24 ENCOUNTER — Telehealth (HOSPITAL_COMMUNITY): Payer: Self-pay | Admitting: Cardiology

## 2024-03-26 ENCOUNTER — Other Ambulatory Visit: Payer: Self-pay | Admitting: Nurse Practitioner

## 2024-03-27 ENCOUNTER — Ambulatory Visit

## 2024-03-27 VITALS — BP 94/69 | HR 103 | Temp 97.3°F | Resp 20 | Ht 64.0 in | Wt 115.4 lb

## 2024-03-27 DIAGNOSIS — D509 Iron deficiency anemia, unspecified: Secondary | ICD-10-CM | POA: Diagnosis not present

## 2024-03-27 MED ORDER — SODIUM CHLORIDE 0.9 % IV SOLN
400.0000 mg | Freq: Once | INTRAVENOUS | Status: AC
Start: 2024-03-27 — End: 2024-03-27
  Administered 2024-03-27: 400 mg via INTRAVENOUS
  Filled 2024-03-27: qty 20

## 2024-03-27 MED ORDER — ACETAMINOPHEN 325 MG PO TABS
650.0000 mg | ORAL_TABLET | Freq: Once | ORAL | Status: AC
  Administered 2024-03-27: 650 mg via ORAL
  Filled 2024-03-27: qty 2

## 2024-03-27 MED ORDER — DIPHENHYDRAMINE HCL 25 MG PO CAPS
50.0000 mg | ORAL_CAPSULE | Freq: Once | ORAL | Status: AC
  Administered 2024-03-27: 25 mg via ORAL
  Filled 2024-03-27: qty 2

## 2024-03-27 NOTE — Progress Notes (Signed)
 Diagnosis: Iron  Deficiency Anemia  Provider:  Praveen Mannam MD  Procedure: IV Infusion  IV Type: Peripheral, IV Location: L Antecubital  Venofer  (Iron  Sucrose), Dose: 400 mg  Infusion Start Time: 1350  Infusion Stop Time: 1645  Post Infusion IV Care: Observation period completed and Peripheral IV Discontinued  Discharge: Condition: Good, Destination: Home . AVS Declined  Performed by:  Maximiano JONELLE Pouch, LPN

## 2024-04-03 ENCOUNTER — Ambulatory Visit
Admission: RE | Admit: 2024-04-03 | Discharge: 2024-04-03 | Disposition: A | Discharge status: Home / Self Care | Source: Ambulatory Visit | Attending: Obstetrics and Gynecology | Admitting: Obstetrics and Gynecology

## 2024-04-03 DIAGNOSIS — Z1231 Encounter for screening mammogram for malignant neoplasm of breast: Secondary | ICD-10-CM

## 2024-04-03 NOTE — Progress Notes (Incomplete)
   ADVANCED HEART FAILURE NEW PATIENT CLINIC NOTE  Referring Physician: Garald Karlynn GAILS, MD  Primary Care: Plotnikov, Karlynn GAILS, MD Primary Cardiologist:  HPI: Emily Park is a 53 y.o. female with a PMH of *** who presents for initial visit for further evaluation and treatment of heart failure/cardiomyopathy.      {Anything typed between these two boxes will persist and can be pulled forward to future notes. This phrase will delete itself when the note is signed :1}      SUBJECTIVE:   PMH, current medications, allergies, social history, and family history reviewed in epic.  PHYSICAL EXAM: There were no vitals filed for this visit. GENERAL: Well nourished and in no apparent distress at rest.  PULM:  Normal work of breathing, clear to auscultation bilaterally. Respirations are unlabored.  CARDIAC:  JVP: ***         Normal rate with regular rhythm. No murmurs, rubs or gallops.  *** edema. Warm and well perfused extremities. ABDOMEN: Soft, non-tender, non-distended. NEUROLOGIC: Patient is oriented x3 with no focal or lateralizing neurologic deficits.    DATA REVIEW  ECG: ***    ECHO: ***  CATH: ***    Heart failure review: - Classification: {HFCLASS:30917} - Etiology: {Cardiomyopathy:30918} - NYHA Class:  - Volume status: {volumechf:30919} - ACEi/ARB/ARNI: {HF:30752} - Aldosterone antagonist: {HF:30752} - Beta-blocker: {HF:30752} - Digoxin: {HF:30752} - Hydralazine /Nitrates: {HF:30752} - SGLT2i: {HF:30752} - GLP-1: {GLP:30906} - Advanced therapies: {Advancedtherapies:30916} - ICD: {ICD:30901}   ASSESSMENT & PLAN:  ***  Follow up in ***  Morene Brownie, MD Advanced Heart Failure Mechanical Circulatory Support 04/03/24

## 2024-04-04 ENCOUNTER — Encounter (HOSPITAL_COMMUNITY): Admitting: Cardiology

## 2024-04-06 ENCOUNTER — Emergency Department (HOSPITAL_COMMUNITY)

## 2024-04-06 ENCOUNTER — Encounter (HOSPITAL_COMMUNITY): Payer: Self-pay

## 2024-04-06 ENCOUNTER — Emergency Department (HOSPITAL_COMMUNITY)
Admission: EM | Admit: 2024-04-06 | Discharge: 2024-04-06 | Disposition: A | Discharge status: Home / Self Care | Attending: Emergency Medicine | Admitting: Emergency Medicine

## 2024-04-06 DIAGNOSIS — R4781 Slurred speech: Secondary | ICD-10-CM | POA: Diagnosis not present

## 2024-04-06 DIAGNOSIS — Z7982 Long term (current) use of aspirin: Secondary | ICD-10-CM | POA: Diagnosis not present

## 2024-04-06 DIAGNOSIS — R42 Dizziness and giddiness: Secondary | ICD-10-CM | POA: Diagnosis present

## 2024-04-06 DIAGNOSIS — T424X5A Adverse effect of benzodiazepines, initial encounter: Secondary | ICD-10-CM | POA: Diagnosis not present

## 2024-04-06 DIAGNOSIS — T887XXA Unspecified adverse effect of drug or medicament, initial encounter: Secondary | ICD-10-CM

## 2024-04-06 LAB — CBC WITH DIFFERENTIAL/PLATELET
Abs Immature Granulocytes: 0.05 K/uL (ref 0.00–0.07)
Basophils Absolute: 0 K/uL (ref 0.0–0.1)
Basophils Relative: 0 %
Eosinophils Absolute: 0.3 K/uL (ref 0.0–0.5)
Eosinophils Relative: 3 %
HCT: 32.1 % — ABNORMAL LOW (ref 36.0–46.0)
Hemoglobin: 10.4 g/dL — ABNORMAL LOW (ref 12.0–15.0)
Immature Granulocytes: 0 %
Lymphocytes Relative: 15 %
Lymphs Abs: 1.7 K/uL (ref 0.7–4.0)
MCH: 29.7 pg (ref 26.0–34.0)
MCHC: 32.4 g/dL (ref 30.0–36.0)
MCV: 91.7 fL (ref 80.0–100.0)
Monocytes Absolute: 0.6 K/uL (ref 0.1–1.0)
Monocytes Relative: 6 %
Neutro Abs: 8.6 K/uL — ABNORMAL HIGH (ref 1.7–7.7)
Neutrophils Relative %: 76 %
Platelets: 261 K/uL (ref 150–400)
RBC: 3.5 MIL/uL — ABNORMAL LOW (ref 3.87–5.11)
RDW: 18 % — ABNORMAL HIGH (ref 11.5–15.5)
WBC: 11.2 K/uL — ABNORMAL HIGH (ref 4.0–10.5)
nRBC: 0 % (ref 0.0–0.2)

## 2024-04-06 LAB — COMPREHENSIVE METABOLIC PANEL WITH GFR
ALT: 11 U/L (ref 0–44)
AST: 45 U/L — ABNORMAL HIGH (ref 15–41)
Albumin: 3.3 g/dL — ABNORMAL LOW (ref 3.5–5.0)
Alkaline Phosphatase: 55 U/L (ref 38–126)
Anion gap: 9 (ref 5–15)
BUN: 19 mg/dL (ref 6–20)
CO2: 19 mmol/L — ABNORMAL LOW (ref 22–32)
Calcium: 8.8 mg/dL — ABNORMAL LOW (ref 8.9–10.3)
Chloride: 108 mmol/L (ref 98–111)
Creatinine, Ser: 1.53 mg/dL — ABNORMAL HIGH (ref 0.44–1.00)
GFR, Estimated: 40 mL/min — ABNORMAL LOW (ref >60)
Glucose, Bld: 81 mg/dL (ref 70–99)
Potassium: 3.6 mmol/L (ref 3.5–5.1)
Sodium: 136 mmol/L (ref 135–145)
Total Bilirubin: 0.8 mg/dL (ref 0.0–1.2)
Total Protein: 6.2 g/dL — ABNORMAL LOW (ref 6.5–8.1)

## 2024-04-06 LAB — RAPID URINE DRUG SCREEN, HOSP PERFORMED
Amphetamines: NOT DETECTED
Barbiturates: NOT DETECTED
Benzodiazepines: POSITIVE — AB
Cocaine: NOT DETECTED
Opiates: NOT DETECTED
Tetrahydrocannabinol: NOT DETECTED

## 2024-04-06 LAB — URINALYSIS, ROUTINE W REFLEX MICROSCOPIC
Bilirubin Urine: NEGATIVE
Glucose, UA: NEGATIVE mg/dL
Hgb urine dipstick: NEGATIVE
Ketones, ur: NEGATIVE mg/dL
Leukocytes,Ua: NEGATIVE
Nitrite: NEGATIVE
Protein, ur: NEGATIVE mg/dL
Specific Gravity, Urine: 1.009 (ref 1.005–1.030)
pH: 6 (ref 5.0–8.0)

## 2024-04-06 LAB — TROPONIN I (HIGH SENSITIVITY)
Troponin I (High Sensitivity): 2 ng/L (ref <18)
Troponin I (High Sensitivity): 4 ng/L (ref <18)

## 2024-04-06 LAB — CBG MONITORING, ED: Glucose-Capillary: 71 mg/dL (ref 70–99)

## 2024-04-06 MED ORDER — LACTATED RINGERS IV BOLUS
1000.0000 mL | Freq: Once | INTRAVENOUS | Status: AC
  Administered 2024-04-06: 1000 mL via INTRAVENOUS

## 2024-04-06 NOTE — Discharge Instructions (Addendum)
 The workup in the emergency room including MRI of your brain is reassuring.  There is no evidence of stroke.  We have no evidence of any infection.  Electrolytes, renal function is normal.  At this time, we suspect that your symptoms are likely because of either medication toxicity or withdrawals.  Please call your doctor for close follow-up.  Please do not take Xanax  or any other medications that are not prescribed to you.  Make sure you take your medications that are prescribed appropriately, otherwise you can have withdrawal symptoms.

## 2024-04-06 NOTE — ED Notes (Signed)
 Patient transported to MRI

## 2024-04-06 NOTE — ED Notes (Signed)
 Pt given lunch bag with beverage per MD.

## 2024-04-06 NOTE — ED Notes (Signed)
..  The patient is A&OX4, ambulatory at d/c with independent steady gait but was wheeled out of ED via wheelchair, NAD. Pt verbalized understanding of d/c instructions and follow up care.

## 2024-04-06 NOTE — ED Triage Notes (Signed)
 Pt BIB [facility] from home w c/o stroke like symptoms. Pt husband reported yesterday pt had gait problems all day and had a fall, today pt woke up at 1030 with aphasia and had another fall. Pt admits to taking xanax  and one other opiate that she does not remember the name of.

## 2024-04-06 NOTE — ED Notes (Signed)
 Patient ambulatory to RR. Tolerated well with standby assistance

## 2024-04-06 NOTE — ED Provider Notes (Signed)
[place] EMERGENCY DEPARTMENT AT [facility] Provider Note   CSN: [id number] Arrival date & time: 04/06/24  1304     Patient presents with: No chief complaint on file.   Emily Park is a 53 y.o. female.   HPI     53 year old female comes in with chief complaint of dizziness.  Patient has history of SVT, migraines, chronic benzo use.  Patient alleges that she ran out of her Klonopin  about a week ago.  She has been having unsteady gait over the last few days.  She did not think she was having withdrawals from Klonopin .  She ran out of her Klonopin , as she gave some of her Klonopin  to her son.  Yesterday, her dizziness worsened, and she fell down.  Today patient woke up at 10:30 AM, she was having difficulty with her speech and expressing herself.  Patient was given Xanax  by the husband.  Her symptoms then got worse, and therefore he called 911.    Prior to Admission medications   Medication Sig Start Date End Date Taking? Authorizing Provider  aspirin  EC 81 MG tablet Take 1 tablet (81 mg total) by mouth daily. 08/02/23 08/01/24  Plotnikov, Aleksei V, MD  Cholecalciferol (VITAMIN D3) 50 MCG (2000 UT) capsule Take 2,000 Units by mouth daily.    [provider]  clonazePAM  (KLONOPIN ) 0.5 MG tablet Take 1 tablet (0.5 mg total) by mouth daily as needed for anxiety. 02/01/24 01/31/25  Carvin Arvella CHRISTELLA, MD  doxepin  (SINEQUAN ) 100 MG capsule SMARTSIG:1 Capsule(s) By Mouth Every Evening 02/11/24   [provider]  gabapentin  (NEURONTIN ) 300 MG capsule Take 1 capsule (300 mg total) by mouth 3 (three) times daily as needed. 12/20/23 03/19/24  Carvin Arvella CHRISTELLA, MD  LINZESS  145 MCG CAPS capsule TAKE 1 CAPSULE (145 MCG TOTAL) BY MOUTH ONCE DAILY 10/15/23   Plotnikov, Aleksei V, MD  lurasidone  (LATUDA ) 40 MG TABS tablet Take 1 tablet (40 mg total) by mouth daily with supper. 02/01/24   Carvin Arvella CHRISTELLA, MD  LYLLANA 0.05 MG/24HR patch Place 1 patch onto the skin. 07/11/23    [provider]  Multiple Vitamin (MULTIVITAMIN WITH MINERALS) TABS tablet Take 1 tablet by mouth every morning.    [provider]  pantoprazole  (PROTONIX ) 40 MG tablet Take 1 tablet (40 mg total) by mouth 2 (two) times daily. 03/20/23   Plotnikov, Aleksei V, MD  polyethylene glycol (MIRALAX  / GLYCOLAX ) 17 g packet Take 17 g by mouth 2 (two) times daily. 07/10/21   Ezenduka, Nkeiruka J, MD  polyvinyl alcohol  (LIQUIFILM TEARS) 1.4 % ophthalmic solution Place 1 drop into both eyes as needed for dry eyes. 12/28/22   Rosario Leatrice FERNS, MD  potassium chloride  SA (KLOR-CON  M20) 20 MEQ tablet Take 2 tablets (40 mEq total) by mouth 2 (two) times daily. 02/16/24   Geofm Glade PARAS, MD  prazosin  (MINIPRESS ) 1 MG capsule Take 1 capsule (1 mg total) by mouth at bedtime. 12/20/23   Carvin Arvella CHRISTELLA, MD  progesterone (PROMETRIUM) 100 MG capsule Take 100 mg by mouth at bedtime. 06/21/23   [provider]  sertraline  (ZOLOFT ) 100 MG tablet Take 2 tablets (200 mg total) by mouth daily. 02/01/24 05/01/24  Carvin Arvella CHRISTELLA, MD  Sodium Fluoride  (PREVIDENT  5000 BOOSTER PLUS) 1.1 % PSTE As directed tid 03/20/23   Plotnikov, Aleksei V, MD  topiramate  (TOPAMAX ) 100 MG tablet TAKE 1 TABLET BY MOUTH EVERY DAY 02/22/24   Carvin Arvella CHRISTELLA, MD  Allergies: Augmentin  [amoxicillin -pot clavulanate] and Zithromax [azithromycin]    Review of Systems  All other systems reviewed and are negative.   Updated Vital Signs BP 107/70   Pulse 76   Temp 97.6 F (36.4 C) (Oral)   Resp 20   SpO2 97%   Physical Exam Vitals and nursing note reviewed.  Constitutional:      Appearance: She is well-developed.  HENT:     Head: Atraumatic.  Cardiovascular:     Rate and Rhythm: Normal rate.  Pulmonary:     Effort: Pulmonary effort is normal.  Musculoskeletal:     Cervical back: Normal range of motion and neck supple.  Skin:    General: Skin is warm and dry.  Neurological:     Mental Status: She is alert and  oriented to person, place, and time.     Cranial Nerves: No cranial nerve deficit.     Sensory: No sensory deficit.     Motor: No weakness.     Coordination: Coordination normal.     Comments: Cerebellar exam is normal (finger to nose) Sensory exam normal for bilateral upper and lower extremities - and patient is able to discriminate between sharp and dull. Motor exam is 4+/5      (all labs ordered are listed, but only abnormal results are displayed) Labs Reviewed  CBC WITH DIFFERENTIAL/PLATELET - Abnormal; Notable for the following components:      Result Value   WBC 11.2 (*)    RBC 3.50 (*)    Hemoglobin 10.4 (*)    HCT 32.1 (*)    RDW 18.0 (*)    Neutro Abs 8.6 (*)    All other components within normal limits  COMPREHENSIVE METABOLIC PANEL WITH GFR - Abnormal; Notable for the following components:   CO2 19 (*)    Creatinine, Ser 1.53 (*)    Calcium 8.8 (*)    Total Protein 6.2 (*)    Albumin 3.3 (*)    AST 45 (*)    GFR, Estimated 40 (*)    All other components within normal limits  RAPID URINE DRUG SCREEN, HOSP PERFORMED - Abnormal; Notable for the following components:   Benzodiazepines POSITIVE (*)    All other components within normal limits  URINALYSIS, ROUTINE W REFLEX MICROSCOPIC  CBG MONITORING, ED  TROPONIN I (HIGH SENSITIVITY)  TROPONIN I (HIGH SENSITIVITY)    EKG: EKG Interpretation Date/Time:  Sunday April 06 2024 13:43:47 EDT Ventricular Rate:  88 PR Interval:  161 QRS Duration:  112 QT Interval:  398 QTC Calculation: 482 R Axis:   36  Text Interpretation: Sinus rhythm Borderline intraventricular conduction delay Low voltage, precordial leads Borderline T abnormalities, anterior leads No acute changes Confirmed by Charlyn Sora [phone]) on 04/06/2024 3:15:49 PM  Radiology: MR BRAIN WO CONTRAST Result Date: 04/06/2024 CLINICAL DATA:  Initial evaluation for acute neuro deficit, stroke suspected. EXAM: MRI HEAD WITHOUT CONTRAST TECHNIQUE:  Multiplanar, multiecho pulse sequences of the brain and surrounding structures were obtained without intravenous contrast. COMPARISON:  Comparison made to CT from earlier the same day. FINDINGS: Brain: Cerebral volume within normal limits. Few scattered subcentimeter foci of T2/FLAIR signal abnormality seen involving the supratentorial cerebral white matter, nonspecific, but less than is typically seen for age. No evidence for acute or subacute infarct. No rows of chronic cortical infarction. No acute or chronic intracranial blood products. No mass lesion, midline shift or mass effect. No hydrocephalus or extra-axial fluid collection. Pituitary gland within normal limits. Vascular: Major intracranial  vascular flow voids are maintained. Skull and upper cervical spine: Craniocervical junction within normal limits. Bone marrow signal intensity normal. No scalp soft tissue abnormality. Sinuses/Orbits: Globes and orbital soft tissues within normal limits. Paranasal sinuses are largely clear. No mastoid effusion. Other: None. IMPRESSION: Normal brain MRI for age. No acute intracranial abnormality identified. Electronically Signed   By: Morene Hoard M.D.   On: 04/06/2024 20:02   CT Head Wo Contrast Result Date: 04/06/2024 CLINICAL DATA:  Head trauma, moderate-severe AMS EXAM: CT HEAD WITHOUT CONTRAST TECHNIQUE: Contiguous axial images were obtained from the base of the skull through the vertex without intravenous contrast. RADIATION DOSE REDUCTION: This exam was performed according to the departmental dose-optimization program which includes automated exposure control, adjustment of the mA and/or kV according to patient size and/or use of iterative reconstruction technique. COMPARISON:  March 03, 2024 FINDINGS: Brain: The ventricles appear age appropriate. No mass effect or midline shift. Gray-white differentiation is preserved without focal attenuation abnormality.No evidence of acute territorial infarction,  extra-axial fluid collection, hemorrhage, or mass lesion. The basilar cisterns are patent without downward herniation. The cerebellar hemispheres and vermis are well formed without mass lesion or focal attenuation abnormality. Vascular: No hyperdense vessel. Skull: Normal. Negative for fracture or focal lesion. Sinuses/Orbits: The paranasal sinuses and mastoids are clear.The globes appear intact. No retrobulbar hematoma. Other: None. IMPRESSION: No acute intracranial abnormality, specifically, no acute hemorrhage, territorial infarction, or intracranial mass. Electronically Signed   By: Rogelia Myers M.D.   On: 04/06/2024 15:04     Procedures   Medications Ordered in the ED  lactated ringers  bolus 1,000 mL (0 mLs Intravenous Stopped 04/06/24 2057)                                    Medical Decision Making Amount and/or Complexity of Data Reviewed Labs: ordered. Radiology: ordered.   Patient comes in with chief complaint of confusion, gait issues, speech difficulty.  Symptoms have been present now for at least 1 day.  Patient has history of chronic benzo use.  It appears that she ran out of her benzos a week ago, and took her husband's Xanax  today.  She took the medications as she was having speech difficulty.  Patient denies any heavy smoking, drinking.  Initial impression is that most likely patient is having polypharmacy, severe electrolyte abnormality, dehydration, withdrawals.  Low suspicion for stroke.  Traumatic brain injury also possible.  Will get CT head, basic labs and reassess the patient.  Reassessment: Orthostatic pressures are normal.  Patient is able to articulate well to me. She still feels dizzy.  We will get MRI of the brain.  If it shows no stroke, then we will discharge her.  Reassessment: MRI negative, patient has ambulated to the bathroom.  We will discharge her. The patient appears reasonably screened and/or stabilized for discharge and I doubt any other medical  condition or other [facility] requiring further screening, evaluation, or treatment in the ED at this time prior to discharge.   Results from the ER workup discussed with the patient face to face and all questions answered to the best of my ability. The patient is safe for discharge with strict return precautions.   Final diagnoses:  Slurred speech  Side effect of medication  Dizziness    ED Discharge Orders     None          Charlyn Sora, MD 04/06/24  2218  

## 2024-04-06 NOTE — ED Notes (Signed)
 Patient was told to ambulate with assistance, said I am feeling really dizzy and I know I am going to fall. RN notified. Postponed for the moment per RN.

## 2024-04-06 NOTE — ED Notes (Signed)
 Pt has returned from MRI.

## 2024-04-08 ENCOUNTER — Encounter (HOSPITAL_COMMUNITY): Payer: Self-pay

## 2024-04-08 ENCOUNTER — Telehealth (HOSPITAL_COMMUNITY): Admitting: Psychiatry

## 2024-04-10 NOTE — Telephone Encounter (Signed)
 Patient called for a refill on [facility] . Patient has been seen within the past year. Refill sent to pharmacy on file. Patient has upcoming appointment in November.

## 2024-04-15 ENCOUNTER — Inpatient Hospital Stay: Admitting: Licensed Clinical Social Worker

## 2024-04-15 ENCOUNTER — Inpatient Hospital Stay (HOSPITAL_BASED_OUTPATIENT_CLINIC_OR_DEPARTMENT_OTHER): Admitting: Nurse Practitioner

## 2024-04-15 ENCOUNTER — Other Ambulatory Visit: Payer: Self-pay

## 2024-04-15 ENCOUNTER — Encounter: Payer: Self-pay | Admitting: Nurse Practitioner

## 2024-04-15 ENCOUNTER — Telehealth: Payer: Self-pay

## 2024-04-15 ENCOUNTER — Inpatient Hospital Stay: Attending: Physician Assistant

## 2024-04-15 ENCOUNTER — Inpatient Hospital Stay

## 2024-04-15 VITALS — BP 116/70 | HR 88 | Temp 97.8°F | Resp 17 | Wt 117.6 lb

## 2024-04-15 DIAGNOSIS — R55 Syncope and collapse: Secondary | ICD-10-CM

## 2024-04-15 DIAGNOSIS — I951 Orthostatic hypotension: Secondary | ICD-10-CM | POA: Diagnosis not present

## 2024-04-15 DIAGNOSIS — D508 Other iron deficiency anemias: Secondary | ICD-10-CM

## 2024-04-15 DIAGNOSIS — D509 Iron deficiency anemia, unspecified: Secondary | ICD-10-CM | POA: Insufficient documentation

## 2024-04-15 DIAGNOSIS — F32A Depression, unspecified: Secondary | ICD-10-CM | POA: Insufficient documentation

## 2024-04-15 DIAGNOSIS — R4781 Slurred speech: Secondary | ICD-10-CM | POA: Insufficient documentation

## 2024-04-15 DIAGNOSIS — N189 Chronic kidney disease, unspecified: Secondary | ICD-10-CM | POA: Insufficient documentation

## 2024-04-15 DIAGNOSIS — Z79899 Other long term (current) drug therapy: Secondary | ICD-10-CM | POA: Diagnosis not present

## 2024-04-15 DIAGNOSIS — E878 Other disorders of electrolyte and fluid balance, not elsewhere classified: Secondary | ICD-10-CM | POA: Diagnosis not present

## 2024-04-15 DIAGNOSIS — K5909 Other constipation: Secondary | ICD-10-CM | POA: Insufficient documentation

## 2024-04-15 DIAGNOSIS — K259 Gastric ulcer, unspecified as acute or chronic, without hemorrhage or perforation: Secondary | ICD-10-CM | POA: Insufficient documentation

## 2024-04-15 DIAGNOSIS — D631 Anemia in chronic kidney disease: Secondary | ICD-10-CM | POA: Diagnosis not present

## 2024-04-15 DIAGNOSIS — E876 Hypokalemia: Secondary | ICD-10-CM

## 2024-04-15 DIAGNOSIS — F419 Anxiety disorder, unspecified: Secondary | ICD-10-CM | POA: Diagnosis not present

## 2024-04-15 DIAGNOSIS — R197 Diarrhea, unspecified: Secondary | ICD-10-CM | POA: Diagnosis not present

## 2024-04-15 DIAGNOSIS — K3184 Gastroparesis: Secondary | ICD-10-CM | POA: Diagnosis not present

## 2024-04-15 LAB — CBC WITH DIFFERENTIAL (CANCER CENTER ONLY)
Abs Immature Granulocytes: 0.04 K/uL (ref 0.00–0.07)
Basophils Absolute: 0.1 K/uL (ref 0.0–0.1)
Basophils Relative: 1 %
Eosinophils Absolute: 0.1 K/uL (ref 0.0–0.5)
Eosinophils Relative: 2 %
HCT: 39.5 % (ref 36.0–46.0)
Hemoglobin: 13.2 g/dL (ref 12.0–15.0)
Immature Granulocytes: 1 %
Lymphocytes Relative: 26 %
Lymphs Abs: 2.3 K/uL (ref 0.7–4.0)
MCH: 29.3 pg (ref 26.0–34.0)
MCHC: 33.4 g/dL (ref 30.0–36.0)
MCV: 87.8 fL (ref 80.0–100.0)
Monocytes Absolute: 0.4 K/uL (ref 0.1–1.0)
Monocytes Relative: 5 %
Neutro Abs: 6 K/uL (ref 1.7–7.7)
Neutrophils Relative %: 65 %
Platelet Count: 426 K/uL — ABNORMAL HIGH (ref 150–400)
RBC: 4.5 MIL/uL (ref 3.87–5.11)
RDW: 17.2 % — ABNORMAL HIGH (ref 11.5–15.5)
WBC Count: 8.9 K/uL (ref 4.0–10.5)
nRBC: 0 % (ref 0.0–0.2)

## 2024-04-15 LAB — CMP (CANCER CENTER ONLY)
ALT: 13 U/L (ref 0–44)
AST: 17 U/L (ref 15–41)
Albumin: 4.6 g/dL (ref 3.5–5.0)
Alkaline Phosphatase: 72 U/L (ref 38–126)
Anion gap: 11 (ref 5–15)
BUN: 22 mg/dL — ABNORMAL HIGH (ref 6–20)
CO2: 23 mmol/L (ref 22–32)
Calcium: 9.3 mg/dL (ref 8.9–10.3)
Chloride: 103 mmol/L (ref 98–111)
Creatinine: 1.48 mg/dL — ABNORMAL HIGH (ref 0.44–1.00)
GFR, Estimated: 42 mL/min — ABNORMAL LOW (ref >60)
Glucose, Bld: 92 mg/dL (ref 70–99)
Potassium: 2.3 mmol/L — CL (ref 3.5–5.1)
Sodium: 137 mmol/L (ref 135–145)
Total Bilirubin: 0.4 mg/dL (ref 0.0–1.2)
Total Protein: 7.9 g/dL (ref 6.5–8.1)

## 2024-04-15 LAB — MAGNESIUM: Magnesium: 1.7 mg/dL (ref 1.7–2.4)

## 2024-04-15 LAB — IRON AND IRON BINDING CAPACITY (CC-WL,HP ONLY)
Iron: 114 ug/dL (ref 28–170)
Saturation Ratios: 39 % — ABNORMAL HIGH (ref 10.4–31.8)
TIBC: 295 ug/dL (ref 250–450)
UIBC: 181 ug/dL (ref 148–442)

## 2024-04-15 LAB — FERRITIN: Ferritin: 163 ng/mL (ref 11–307)

## 2024-04-15 MED ORDER — POTASSIUM CHLORIDE 20 MEQ/15ML (10%) PO SOLN: 40.0000 meq | Freq: Two times a day (BID) | ORAL | 1 refills | Status: DC

## 2024-04-15 MED ORDER — POTASSIUM CHLORIDE CRYS ER 20 MEQ PO TBCR
20.0000 meq | EXTENDED_RELEASE_TABLET | Freq: Once | ORAL | Status: AC
  Administered 2024-04-15: 20 meq via ORAL
  Filled 2024-04-15: qty 1

## 2024-04-15 MED ORDER — POTASSIUM CHLORIDE 10 MEQ/100ML IV SOLN
10.0000 meq | INTRAVENOUS | Status: AC
  Administered 2024-04-15 (×2): 10 meq via INTRAVENOUS
  Filled 2024-04-15 (×2): qty 100

## 2024-04-15 MED ORDER — SODIUM CHLORIDE 0.9 % IV SOLN: Freq: Once | INTRAVENOUS | Status: AC

## 2024-04-15 NOTE — Progress Notes (Signed)
 [facility] CSW Progress Note  Visual merchandiser met with patient to follow-up on emotional support.    Interventions: CSW met w/ pt in symptom management.  Pt reports she sees a psychiatrist at [facility], but needs to see a counselor for one on one talk therapy.  [facility] has informed her there is currently a wait list for a counselor.  Pt struggling with multiple health issues of her own as well as her husband's cancer treatment for esophageal cancer which has been extremely stressful. Pt recently changed her psychiatric medication regiment as well which has been challenging to adjust to.  CSW contacted [facility] with pt to check on counseling options.  [facility] verbalized agreement to put pt on the list to be called if there are any cancellations in addition to the wait list she was already on.  [facility] offered a female Personal assistant immediately, but pt states her preference is for a female in person.  CSW to provide short term counseling while pt waits to establish with a counselor at [facility].       Follow Up Plan:  CSW will see patient on 8/26    Emily JONELLE Manna, LCSW Clinical Social Worker [facility]

## 2024-04-15 NOTE — Telephone Encounter (Signed)
 Critical lab value: K+ 2.3 added a Mg+ as well   Notified Lacie Burton, NP of the critical lab value.

## 2024-04-15 NOTE — Patient Instructions (Signed)
 Hypokalemia Hypokalemia means that the amount of potassium in the blood is lower than normal. Potassium is a mineral (electrolyte) that helps regulate the amount of fluid in the body. It also stimulates muscle tightening (contraction) and helps nerves work properly. Normally, most of the body's potassium is inside cells, and only a very small amount is in the blood. Because the amount in the blood is so small, minor changes to potassium levels in the blood can be life-threatening. What are the causes? This condition may be caused by: Antibiotic medicine. Diarrhea or vomiting. Taking too much of a medicine that helps you have a bowel movement (laxative) can cause diarrhea and lead to hypokalemia. Chronic kidney disease (CKD). Medicines that help the body get rid of excess fluid (diuretics). Eating disorders, such as anorexia or bulimia. Low magnesium levels in the body. Sweating a lot. What are the signs or symptoms? Symptoms of this condition include: Weakness. Constipation. Fatigue. Muscle cramps. Mental confusion. Skipped heartbeats or irregular heartbeat (palpitations). Tingling or numbness. How is this diagnosed? This condition is diagnosed with a blood test. How is this treated? This condition may be treated by: Taking potassium supplements. Adjusting the medicines that you take. Eating more foods that contain a lot of potassium. If your potassium level is very low, you may need to get potassium through an IV and be monitored in the hospital. Follow these instructions at home: Eating and drinking  Eat a healthy diet. A healthy diet includes fresh fruits and vegetables, whole grains, healthy fats, and lean proteins. If told, eat more foods that contain a lot of potassium. These include: Nuts, such as peanuts and pistachios. Seeds, such as sunflower seeds and pumpkin seeds. Peas, lentils, and lima beans. Whole grain and bran cereals and breads. Fresh fruits and vegetables,  such as apricots, avocado, bananas, cantaloupe, kiwi, oranges, tomatoes, asparagus, and potatoes. Juices, such as orange, tomato, and prune. Lean meats, including fish. Milk and milk products, such as yogurt. General instructions Take over-the-counter and prescription medicines only as told by your health care provider. This includes vitamins, natural food products, and supplements. Keep all follow-up visits. This is important. Contact a health care provider if: You have weakness that gets worse. You feel your heart pounding or racing. You vomit. You have diarrhea. You have diabetes and you have trouble keeping your blood sugar in your target range. Get help right away if: You have chest pain. You have shortness of breath. You have vomiting or diarrhea that lasts for more than 2 days. You faint. These symptoms may be an emergency. Get help right away. Call 911. Do not wait to see if the symptoms will go away. Do not drive yourself to the hospital. Summary Hypokalemia means that the amount of potassium in the blood is lower than normal. This condition is diagnosed with a blood test. Hypokalemia may be treated by taking potassium supplements, adjusting the medicines that you take, or eating more foods that are high in potassium. If your potassium level is very low, you may need to get potassium through an IV and be monitored in the hospital. This information is not intended to replace advice given to you by your health care provider. Make sure you discuss any questions you have with your health care provider. Document Revised: 04/28/2021 Document Reviewed: 04/28/2021 Elsevier Patient Education  2024 [employer].

## 2024-04-15 NOTE — Progress Notes (Signed)
 [facility]   Telephone:([phone] Fax:(336) [phone]    Patient Care Team: Plotnikov, Karlynn GAILS, MD as PCP - General (Internal Medicine) Sheldon Standing, MD as Consulting Physician (Colon and Rectal Surgery) Elicia Claw, MD as Consulting Physician (Gastroenterology) Voncile Gang, MD (Gastroenterology) Vincente Grip, MD as Consulting Physician (Psychiatry)   CHIEF COMPLAINT: Follow-up iron  deficiency anemia  CURRENT THERAPY: IV iron  as needed  INTERVAL HISTORY Ms. yagi returns for follow-up, last seen by me 03/03/2024 to reestablish care.  She was syncopal in the office and transported to ED for fluids, workup was unremarkable and she was discharged home after hydration.  He subsequently completed IV iron  total 800 mg 7/15 and 7/31.  BP dropped after completing second infusion.  She did feel little better after IV iron  in general.  She continues to have depression, dizziness, low oral intake, and periodic diarrhea.  She vomited yesterday while cleaning out her closet and has not had much to eat or drink this morning. Prescribed oral K 40 mEq BID but only gets that dose in about 2-3 times per week bc pills are large. She was trying to wean off zoloft  and stopped abruptly, also ran out of klonopin  and took xanax . Developed stoke like sx at home and came to ED with slurred speech and dizziness, MRI negative, pressures were normal.  This was felt to be secondary to polypharmacy and possible withdrawal.   ROS  All other systems reviewed and negative  Past Medical History:  Diagnosis Date   Abnormal TSH    ADD (attention deficit disorder)    Anemia    Anxiety    Arrhythmia    Decreased libido    Depression    Heart murmur    Hyperlipidemia    Hypokalemia    Migraine    Nephrolithiasis    Pap smear abnormality of cervix/human papillomavirus (HPV) positive    Sleep difficulties    SVT (supraventricular tachycardia) (HCC)      Past Surgical History:   Procedure Laterality Date   ABLATION     Cardiac ablation, endometrial ablation   BREAST EXCISIONAL BIOPSY Right 2002   CARDIOVERSION     COLONOSCOPY WITH PROPOFOL  N/A 05/16/2021   Procedure: COLONOSCOPY WITH PROPOFOL ;  Surgeon: Dianna Specking, MD;  Location: [facility];  Service: Endoscopy;  Laterality: N/A;   COLPOSCOPY     w/ cervical biopsy   DILITATION & CURRETTAGE/HYSTROSCOPY WITH NOVASURE ABLATION N/A 09/11/2014   Procedure: DILATATION & CURETTAGE/HYSTEROSCOPY WITH NOVASURE ABLATION;  Surgeon: Charlie JINNY Flowers, MD;  Location: [facility];  Service: Gynecology;  Laterality: N/A;   GIVENS CAPSULE STUDY N/A 05/16/2021   Procedure: GIVENS CAPSULE STUDY;  Surgeon: Dianna Specking, MD;  Location: [facility];  Service: Endoscopy;  Laterality: N/A;   XI ROBOT ASSISTED RECTOPEXY N/A 07/07/2021   Procedure: XI ROBOT ASSISTED RECTOPEXY;  Surgeon: Sheldon Standing, MD;  Location: [facility];  Service: General;  Laterality: N/A;   XI ROBOTIC ASSISTED LOWER ANTERIOR RESECTION N/A 07/07/2021   Procedure: XI ROBOTIC ASSISTED LOW ANTERIOR RECTOSIGMOID RESECTION WITH RIGID PROCTOSCOPY, TAP BLOCK;  Surgeon: Sheldon Standing, MD;  Location: [facility];  Service: General;  Laterality: N/A;     Outpatient Encounter Medications as of 04/15/2024  Medication Sig Note   aspirin  EC 81 MG tablet Take 1 tablet (81 mg total) by mouth daily.    Cholecalciferol (VITAMIN D3) 50 MCG (2000 UT) capsule Take 2,000 Units by mouth daily.    clonazePAM  (KLONOPIN ) 0.5 MG  tablet Take 1 tablet (0.5 mg total) by mouth daily as needed for anxiety.    doxepin  (SINEQUAN ) 100 MG capsule SMARTSIG:1 Capsule(s) By Mouth Every Evening    escitalopram (LEXAPRO) 10 MG tablet Take 10 mg by mouth once.    LINZESS  145 MCG CAPS capsule TAKE 1 CAPSULE (145 MCG TOTAL) BY MOUTH ONCE DAILY    LYLLANA 0.05 MG/24HR patch Place 1 patch onto the skin.    Multiple Vitamin (MULTIVITAMIN WITH MINERALS) TABS tablet Take 1 tablet by mouth every morning.     pantoprazole  (PROTONIX ) 40 MG tablet Take 1 tablet (40 mg total) by mouth 2 (two) times daily.    polyethylene glycol (MIRALAX  / GLYCOLAX ) 17 g packet Take 17 g by mouth 2 (two) times daily.    polyvinyl alcohol  (LIQUIFILM TEARS) 1.4 % ophthalmic solution Place 1 drop into both eyes as needed for dry eyes.    potassium chloride  20 MEQ/15ML (10%) SOLN Take 30 mLs (40 mEq total) by mouth 2 (two) times daily.    prazosin  (MINIPRESS ) 1 MG capsule Take 1 capsule (1 mg total) by mouth at bedtime.    progesterone (PROMETRIUM) 100 MG capsule Take 100 mg by mouth at bedtime.    Sodium Fluoride  (PREVIDENT  5000 BOOSTER PLUS) 1.1 % PSTE As directed tid    topiramate  (TOPAMAX ) 100 MG tablet TAKE 1 TABLET BY MOUTH EVERY DAY    [DISCONTINUED] gabapentin  (NEURONTIN ) 300 MG capsule Take 1 capsule (300 mg total) by mouth 3 (three) times daily as needed.    [DISCONTINUED] lurasidone  (LATUDA ) 40 MG TABS tablet Take 1 tablet (40 mg total) by mouth daily with supper.    [DISCONTINUED] potassium chloride  SA (KLOR-CON  M20) 20 MEQ tablet Take 2 tablets (40 mEq total) by mouth 2 (two) times daily. 04/15/2024: pt can't swallow tabs   [DISCONTINUED] sertraline  (ZOLOFT ) 100 MG tablet Take 2 tablets (200 mg total) by mouth daily. 04/15/2024: Switching from ZOLOFT  to LEXAPRO   No facility-administered encounter medications on file as of 04/15/2024.     Today's Vitals   04/15/24 1111 04/15/24 1117  BP: 116/70   Pulse: 88   Resp: 17   Temp: 97.8 F (36.6 C)   SpO2: 97%   Weight: 117 lb 9.6 oz (53.3 kg)   PainSc: 0-No pain 0-No pain   Body mass index is 20.19 kg/m.    PHYSICAL EXAM GENERAL:alert, no distress and comfortable SKIN: no rash  EYES: sclera clear NECK: without mass LYMPH:  no palpable cervical or supraclavicular lymphadenopathy  LUNGS: clear with normal breathing effort HEART: regular rate & rhythm, no lower extremity edema ABDOMEN: abdomen soft, non-tender and normal bowel sounds NEURO: alert &  oriented x 3 with fluent speech, no focal motor/sensory deficits PSYCH: tearful    CBC    Latest Ref Rng & Units 04/15/2024   10:51 AM 04/06/2024    1:21 PM 03/03/2024    3:30 PM  CBC  WBC 4.0 - 10.5 K/uL 8.9  11.2  13.0   Hemoglobin 12.0 - 15.0 g/dL 86.7  89.5  87.5   Hematocrit 36.0 - 46.0 % 39.5  32.1  40.5   Platelets 150 - 400 K/uL 426  261  417       CMP     Latest Ref Rng & Units 04/15/2024   10:51 AM 04/06/2024    1:21 PM 03/03/2024    9:33 AM  CMP  Glucose 70 - 99 mg/dL 92  81  884   BUN 6 - 20  mg/dL 22  19  25    Creatinine 0.44 - 1.00 mg/dL 8.51  8.46  8.11   Sodium 135 - 145 mmol/L 137  136  132   Potassium 3.5 - 5.1 mmol/L 2.3  3.6  3.4   Chloride 98 - 111 mmol/L 103  108  103   CO2 22 - 32 mmol/L 23  19  19    Calcium 8.9 - 10.3 mg/dL 9.3  8.8  89.9   Total Protein 6.5 - 8.1 g/dL 7.9  6.2  8.4   Total Bilirubin 0.0 - 1.2 mg/dL 0.4  0.8  0.3   Alkaline Phos 38 - 126 U/L 72  55  81   AST 15 - 41 U/L 17  45  17   ALT 0 - 44 U/L 13  11  9        ASSESSMENT & PLAN: 53 yo female    Anemia, iron  deficiency vs anemia of chronic disease  -IDA in the past, work up showed gastric ulcers, nonbleeding AVM, and gastroparesis. D/t previous GI surgery she does not absorb iron  well -S/P IV Venofer  in 2022, none since -Normal Hgb may be false due to dehydration. She has low iron  saturation with normal ferritin and TIBC - mild component of iron  deficiency; and CKD among her other medical conditions she has a component of anemia of chronic disease  -mild thrombocytosis is likely benign/reactive to iron  deficiency or inflammation -Due to her fatigue and dizziness and mild IDA in 03/03/24, she completed total 800 mg IV Venofer  7/15 and 03/27/24. Had mild hypotension after infusion but otherwise tolerated well -Today's Hgb is normal, iron  studies are pending; will arrange IV iron  if needed. I doubt her clinical symptoms are related to mild iron  deficiency without anemia  Symptomatic  orthostatic hypotension, syncope -Chronicity is unclear, she has worsening renal function and electrolyte abnormalities in 2025, and is symptomatic with orthostatic hypotension -Recurrent syncope with injuries, fractured ribs recently, and near syncope in clinic 03/03/24 sent to ED for further eval which was unremarkable  -Broad differential for presentation including cardiac etiology, endocrine, autoimmune, and others.  -Will see Cards 8/29, Rheum in 06/2024. Consider endocrinology to r/o adrenal insufficiency   Depression -Reviewed med list, on multiple agents likely polypharmacy contributing to fatigue/weakness  -Recent ED visit for aphasia, stroke was ruled out, felt to be polypharmacy vs withdrawal from psych meds? -Currently sees Vernell Forward PA at [facility], requesting therapist but scheduling has been delayed for unknown reasons.  -Tearful today, denies SI. Will ask SW Devere Manna for urgent assistance    Constipation, nausea, vomiting -H/o gastric ulcers, gastroparesis, and surgery to correct rectal prolapse -Likely does not absorb nutrients such as iron  well -Chronic constipation on linzess . Dose increased which contributed to diarrhea. Also had N/V. K 2.3 today, will replete and change po to liquid per pt request  -Per [facility], will f/up in 06/2024       PLAN: -Recent ED visit and today's labs reviewed -IDA resolved, no need additional IV Iron  at this time -K 2.3 secondary to inadequate supplementation and GI loss, change 40 mEq BID po to liquid, will give 1 L IVF + 40 K today (20 IV + 20 PO due to time constraints and peripheral access) -Urgent SW visit today for mental health resources  -Pt to call PCP for f/up; see Psych, Cardiology, [facility], and Rheum as scheduled  Orders Placed This Encounter  Procedures   CBC with Differential (Cancer Center Only)  Standing Status:   Standing    Number of Occurrences:   5    Expiration Date:   04/15/2025   Ferritin    Standing Status:    Standing    Number of Occurrences:   5    Expiration Date:   04/15/2025   Iron  and Iron  Binding Capacity ([facility] only)    Standing Status:   Standing    Number of Occurrences:   5    Expiration Date:   04/15/2025   CMP (Cancer Center only)    Standing Status:   Future    Expected Date:   10/16/2024    Expiration Date:   04/15/2025      All questions were answered. The patient knows to call the clinic with any problems, questions or concerns. No barriers to learning were detected. I spent 30 minutes counseling the patient face to face. The total time spent in the appointment was 40 minutes and more than 50% was on counseling, review of test results, and coordination of care.   Raegan Winders K Keiara Sneeringer, NP 04/15/2024

## 2024-04-22 ENCOUNTER — Inpatient Hospital Stay: Admitting: Licensed Clinical Social Worker

## 2024-04-25 ENCOUNTER — Encounter (HOSPITAL_COMMUNITY): Admitting: Cardiology

## 2024-04-30 ENCOUNTER — Other Ambulatory Visit (HOSPITAL_COMMUNITY): Payer: Self-pay | Admitting: Psychiatry

## 2024-04-30 DIAGNOSIS — F332 Major depressive disorder, recurrent severe without psychotic features: Secondary | ICD-10-CM

## 2024-04-30 DIAGNOSIS — F411 Generalized anxiety disorder: Secondary | ICD-10-CM

## 2024-05-11 ENCOUNTER — Other Ambulatory Visit (HOSPITAL_COMMUNITY): Payer: Self-pay | Admitting: Psychiatry

## 2024-05-11 DIAGNOSIS — F332 Major depressive disorder, recurrent severe without psychotic features: Secondary | ICD-10-CM

## 2024-05-11 DIAGNOSIS — F411 Generalized anxiety disorder: Secondary | ICD-10-CM

## 2024-05-16 ENCOUNTER — Other Ambulatory Visit: Payer: Self-pay | Admitting: Nurse Practitioner

## 2024-05-21 ENCOUNTER — Other Ambulatory Visit (HOSPITAL_COMMUNITY): Payer: Self-pay | Admitting: Psychiatry

## 2024-05-21 DIAGNOSIS — F332 Major depressive disorder, recurrent severe without psychotic features: Secondary | ICD-10-CM

## 2024-05-21 DIAGNOSIS — F411 Generalized anxiety disorder: Secondary | ICD-10-CM

## 2024-06-15 ENCOUNTER — Other Ambulatory Visit: Payer: Self-pay | Admitting: Internal Medicine

## 2024-06-30 ENCOUNTER — Inpatient Hospital Stay: Admitting: Internal Medicine

## 2024-07-01 ENCOUNTER — Encounter: Payer: Self-pay | Admitting: Internal Medicine

## 2024-07-01 ENCOUNTER — Ambulatory Visit (INDEPENDENT_AMBULATORY_CARE_PROVIDER_SITE_OTHER): Admitting: Internal Medicine

## 2024-07-01 ENCOUNTER — Ambulatory Visit: Payer: Self-pay | Admitting: Internal Medicine

## 2024-07-01 VITALS — BP 114/72 | HR 93 | Ht 64.0 in | Wt 121.0 lb

## 2024-07-01 DIAGNOSIS — E876 Hypokalemia: Secondary | ICD-10-CM | POA: Diagnosis not present

## 2024-07-01 DIAGNOSIS — N1831 Chronic kidney disease, stage 3a: Secondary | ICD-10-CM

## 2024-07-01 DIAGNOSIS — R55 Syncope and collapse: Secondary | ICD-10-CM

## 2024-07-01 DIAGNOSIS — G43909 Migraine, unspecified, not intractable, without status migrainosus: Secondary | ICD-10-CM

## 2024-07-01 DIAGNOSIS — R682 Dry mouth, unspecified: Secondary | ICD-10-CM

## 2024-07-01 DIAGNOSIS — F332 Major depressive disorder, recurrent severe without psychotic features: Secondary | ICD-10-CM

## 2024-07-01 DIAGNOSIS — R251 Tremor, unspecified: Secondary | ICD-10-CM | POA: Insufficient documentation

## 2024-07-01 DIAGNOSIS — Z780 Asymptomatic menopausal state: Secondary | ICD-10-CM

## 2024-07-01 DIAGNOSIS — R270 Ataxia, unspecified: Secondary | ICD-10-CM | POA: Insufficient documentation

## 2024-07-01 LAB — COMPREHENSIVE METABOLIC PANEL WITH GFR
ALT: 12 U/L (ref 0–35)
AST: 15 U/L (ref 0–37)
Albumin: 4.5 g/dL (ref 3.5–5.2)
Alkaline Phosphatase: 72 U/L (ref 39–117)
BUN: 13 mg/dL (ref 6–23)
CO2: 23 meq/L (ref 19–32)
Calcium: 9.3 mg/dL (ref 8.4–10.5)
Chloride: 106 meq/L (ref 96–112)
Creatinine, Ser: 1.44 mg/dL — ABNORMAL HIGH (ref 0.40–1.20)
GFR: 41.18 mL/min — ABNORMAL LOW (ref >60.00)
Glucose, Bld: 105 mg/dL — ABNORMAL HIGH (ref 70–99)
Potassium: 3.2 meq/L — ABNORMAL LOW (ref 3.5–5.1)
Sodium: 138 meq/L (ref 135–145)
Total Bilirubin: 0.3 mg/dL (ref 0.2–1.2)
Total Protein: 7.5 g/dL (ref 6.0–8.3)

## 2024-07-01 LAB — MAGNESIUM: Magnesium: 1.9 mg/dL (ref 1.5–2.5)

## 2024-07-01 MED ORDER — POTASSIUM CHLORIDE ER 10 MEQ PO TBCR: 20.0000 meq | EXTENDED_RELEASE_TABLET | Freq: Two times a day (BID) | ORAL | 5 refills | Status: AC

## 2024-07-01 NOTE — Assessment & Plan Note (Addendum)
 Seeing Dr Vincente Doing better Psychology referral through Dr Jonothan office

## 2024-07-01 NOTE — Assessment & Plan Note (Addendum)
 Ortostatic hypotension - corrected CT of the head without any acute abnormality  MRI brian and lumbar spine with no acute abnormality TTE normal AKI prerenal failure Orthostatic hypotension,  Electrolyte abnormalities, Hypokalemia 2.3 on admission, hypomagnesemia 10/23 Renal US  : No hydronephrosis. Unremarkable renal ultrasound.  most likely hypovolemia given the reflex tachycardia, urine studies with urine sodium of <10 She is currently doing well okay to go back to work on Friday.  Okay to drive

## 2024-07-01 NOTE — Assessment & Plan Note (Signed)
 Chronic gait and balance disorder.  Advised to take a yoga class; balance exercises Neurology consultation-Dr. Onita

## 2024-07-01 NOTE — Assessment & Plan Note (Signed)
 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

## 2024-07-01 NOTE — Assessment & Plan Note (Signed)
 Hydrate well

## 2024-07-01 NOTE — Assessment & Plan Note (Addendum)
 Will ref to Dr Onita for a neurology consultation

## 2024-07-01 NOTE — Assessment & Plan Note (Addendum)
 On KCl, Mag Obtain c-Met

## 2024-07-01 NOTE — Progress Notes (Signed)
 Subjective:  Patient ID: Emily Park, female    DOB: May 28, 1971  Age: 53 y.o. MRN: [id number]  CC: Hospitalization Follow-up [facility] follow up, 06/21/24, emotional distress, fall. Initial reason for hospitalization was for fall at work. MRI and CT came back normal. Patient requesting referral for psychology and neurology )   HPI Emily Park [facility] presents for a post-Hosp f/u - falls, low K, near syncope without full loss of consciousness.  Her potassium and her magnesium  were low. C/o hand tremor over 15 years, getting worse.  She is here with her husband Emily Park  Per hx: Falls resulting in hematoma over right forehead, could hav resulted from Orthostatic hypotension CT of the head without any acute abnormality  MRI brian and lumbar spine with no acute abnormality TTE normal AKI prerenal failure Orthostatic hypotension,  Electrolyte abnormalities, Hypokalemia 2.3 on admission, hypomagnesemia 10/23 Renal US  : No hydronephrosis. Unremarkable renal ultrasound.  most likely hypovolemia given the reflex tachycardia, urine studies with urine sodium of <10 Was given IV fluid bolus on admission in the ED, continued on fluid maintenance. AKI resolved. Electrolyte abnormalities corrected.    Fine tremors bilateral hands, chronic Patient to follow up with Neurology for bilateral extremity tremors   Patient also has a follow up with Rheumatology to follow up on abnormalities noted on her recent lab work as outpatient    Given the multiple stressors she is going through we discussed we could also arrange patient psychiatry follow up   Time spent in dc 35 mins   Electronically signed by: Mauricia Schirmer, MD 06/25/2024 12:19 PM    Outpatient Medications Prior to Visit  Medication Sig Dispense Refill   aspirin  EC 81 MG tablet Take 1 tablet (81 mg total) by mouth daily.     Cholecalciferol (VITAMIN D3) 50 MCG (2000 UT) capsule Take 2,000 Units by mouth daily.     clonazePAM  (KLONOPIN ) 0.5 MG  tablet Take 1 tablet (0.5 mg total) by mouth daily as needed for anxiety. 30 tablet 0   doxepin  (SINEQUAN ) 100 MG capsule SMARTSIG:1 Capsule(s) By Mouth Every Evening     escitalopram (LEXAPRO) 10 MG tablet Take 10 mg by mouth once.     LINZESS  145 MCG CAPS capsule TAKE 1 CAPSULE (145 MCG TOTAL) BY MOUTH ONCE DAILY 90 capsule 3   LYLLANA 0.05 MG/24HR patch Place 1 patch onto the skin.     Multiple Vitamin (MULTIVITAMIN WITH MINERALS) TABS tablet Take 1 tablet by mouth every morning.     pantoprazole  (PROTONIX ) 40 MG tablet TAKE 1 TABLET BY MOUTH TWICE A DAY 180 tablet 3   polyethylene glycol (MIRALAX  / GLYCOLAX ) 17 g packet Take 17 g by mouth 2 (two) times daily. 14 each 0   polyvinyl alcohol  (LIQUIFILM TEARS) 1.4 % ophthalmic solution Place 1 drop into both eyes as needed for dry eyes. 15 mL 0   prazosin  (MINIPRESS ) 1 MG capsule Take 1 capsule (1 mg total) by mouth at bedtime. 90 capsule 0   progesterone (PROMETRIUM) 100 MG capsule Take 100 mg by mouth at bedtime.     Sodium Fluoride  (PREVIDENT  5000 BOOSTER PLUS) 1.1 % PSTE As directed tid 100 mL 3   topiramate  (TOPAMAX ) 100 MG tablet TAKE 1 TABLET BY MOUTH EVERY DAY 90 tablet 0   potassium chloride  20 MEQ/15ML (10%) SOLN TAKE 30 MLS (40 MEQ TOTAL) BY MOUTH 2 (TWO) TIMES DAILY. (Patient taking differently: Take 40 mEq by mouth 2 (two) times daily. Patient taking once daily) 5400 mL  0   No facility-administered medications prior to visit.    ROS: Review of Systems  Constitutional:  Negative for activity change, appetite change, chills, fatigue and unexpected weight change.  HENT:  Negative for congestion, mouth sores and sinus pressure.   Eyes:  Negative for visual disturbance.  Respiratory:  Negative for cough, chest tightness and shortness of breath.   Cardiovascular:  Negative for palpitations.  Gastrointestinal:  Negative for abdominal pain and nausea.  Genitourinary:  Negative for difficulty urinating, frequency and vaginal pain.   Musculoskeletal:  Positive for arthralgias. Negative for back pain and gait problem.  Skin:  Negative for pallor and rash.  Neurological:  Positive for dizziness. Negative for tremors, syncope, weakness, numbness and headaches.  Psychiatric/Behavioral:  Positive for decreased concentration, dysphoric mood and sleep disturbance. Negative for confusion and suicidal ideas. The patient is nervous/anxious.     Objective:  BP 114/72   Pulse 93   Ht 5' 4 (1.626 m)   Wt 121 lb (54.9 kg)   SpO2 99%   BMI 20.77 kg/m   BP Readings from Last 3 Encounters:  07/01/24 114/72  04/15/24 116/70  04/06/24 99/62    Wt Readings from Last 3 Encounters:  07/01/24 121 lb (54.9 kg)  04/15/24 117 lb 9.6 oz (53.3 kg)  03/27/24 115 lb 6.4 oz (52.3 kg)    Physical Exam Constitutional:      General: She is not in acute distress.    Appearance: Normal appearance. She is well-developed.  HENT:     Head: Normocephalic.     Right Ear: External ear normal.     Left Ear: External ear normal.     Nose: Nose normal.  Eyes:     General:        Right eye: No discharge.        Left eye: No discharge.     Conjunctiva/sclera: Conjunctivae normal.     Pupils: Pupils are equal, round, and reactive to light.  Neck:     Thyroid : No thyromegaly.     Vascular: No JVD.     Trachea: No tracheal deviation.  Cardiovascular:     Rate and Rhythm: Normal rate and regular rhythm.     Heart sounds: Normal heart sounds.  Pulmonary:     Effort: No respiratory distress.     Breath sounds: No stridor. No wheezing.  Abdominal:     General: Bowel sounds are normal. There is no distension.     Palpations: Abdomen is soft. There is no mass.     Tenderness: There is no abdominal tenderness. There is no guarding or rebound.  Musculoskeletal:        General: No tenderness.     Cervical back: Normal range of motion and neck supple. No rigidity.     Right lower leg: No edema.     Left lower leg: No edema.  Lymphadenopathy:      Cervical: No cervical adenopathy.  Skin:    Findings: No erythema or rash.  Neurological:     Mental Status: She is oriented to person, place, and time.     Cranial Nerves: No cranial nerve deficit.     Motor: Weakness present. No abnormal muscle tone.     Coordination: Coordination abnormal.     Gait: Gait abnormal.     Deep Tendon Reflexes: Reflexes normal.  Psychiatric:        Behavior: Behavior normal.        Thought Content: Thought content normal.  Judgment: Judgment normal.     B hand tremor L>R Dry mouth Slightly ataxic    A total time of 45 minutes was spent preparing to see the patient, reviewing tests, x-rays, test reports and other medical records.  Also, obtaining history and performing comprehensive physical exam.  Additionally, counseling the patient regarding the above listed issues -near syncope, depression, chronic tremor.   Finally, documenting clinical information in the health records, coordination of care, educating the patient.   Lab Results  Component Value Date   WBC 8.9 04/15/2024   HGB 13.2 04/15/2024   HCT 39.5 04/15/2024   PLT 426 (H) 04/15/2024   GLUCOSE 92 04/15/2024   CHOL 208 (H) 02/24/2014   TRIG 81.0 02/24/2014   HDL 54.90 02/24/2014   LDLCALC 137 (H) 02/24/2014   ALT 13 04/15/2024   AST 17 04/15/2024   NA 137 04/15/2024   K 2.3 (LL) 04/15/2024   CL 103 04/15/2024   CREATININE 1.48 (H) 04/15/2024   BUN 22 (H) 04/15/2024   CO2 23 04/15/2024   TSH 0.45 02/15/2024   INR 1.1 12/20/2022   HGBA1C 5.6 02/15/2024    MR BRAIN WO CONTRAST Result Date: 04/06/2024 CLINICAL DATA:  Initial evaluation for acute neuro deficit, stroke suspected. EXAM: MRI HEAD WITHOUT CONTRAST TECHNIQUE: Multiplanar, multiecho pulse sequences of the brain and surrounding structures were obtained without intravenous contrast. COMPARISON:  Comparison made to CT from earlier the same day. FINDINGS: Brain: Cerebral volume within normal limits. Few scattered  subcentimeter foci of T2/FLAIR signal abnormality seen involving the supratentorial cerebral white matter, nonspecific, but less than is typically seen for age. No evidence for acute or subacute infarct. No rows of chronic cortical infarction. No acute or chronic intracranial blood products. No mass lesion, midline shift or mass effect. No hydrocephalus or extra-axial fluid collection. Pituitary gland within normal limits. Vascular: Major intracranial vascular flow voids are maintained. Skull and upper cervical spine: Craniocervical junction within normal limits. Bone marrow signal intensity normal. No scalp soft tissue abnormality. Sinuses/Orbits: Globes and orbital soft tissues within normal limits. Paranasal sinuses are largely clear. No mastoid effusion. Other: None. IMPRESSION: Normal brain MRI for age. No acute intracranial abnormality identified. Electronically Signed   By: Morene Hoard M.D.   On: 04/06/2024 20:02   CT Head Wo Contrast Result Date: 04/06/2024 CLINICAL DATA:  Head trauma, moderate-severe AMS EXAM: CT HEAD WITHOUT CONTRAST TECHNIQUE: Contiguous axial images were obtained from the base of the skull through the vertex without intravenous contrast. RADIATION DOSE REDUCTION: This exam was performed according to the departmental dose-optimization program which includes automated exposure control, adjustment of the mA and/or kV according to patient size and/or use of iterative reconstruction technique. COMPARISON:  March 03, 2024 FINDINGS: Brain: The ventricles appear age appropriate. No mass effect or midline shift. Gray-white differentiation is preserved without focal attenuation abnormality.No evidence of acute territorial infarction, extra-axial fluid collection, hemorrhage, or mass lesion. The basilar cisterns are patent without downward herniation. The cerebellar hemispheres and vermis are well formed without mass lesion or focal attenuation abnormality. Vascular: No hyperdense vessel.  Skull: Normal. Negative for fracture or focal lesion. Sinuses/Orbits: The paranasal sinuses and mastoids are clear.The globes appear intact. No retrobulbar hematoma. Other: None. IMPRESSION: No acute intracranial abnormality, specifically, no acute hemorrhage, territorial infarction, or intracranial mass. Electronically Signed   By: Rogelia Myers M.D.   On: 04/06/2024 15:04    Assessment & Plan:   Problem List Items Addressed This Visit     Ataxia  Chronic gait and balance disorder.  Advised to take a yoga class; balance exercises Neurology consultation-Dr. Onita      CKD stage 3a, GFR 45-59 ml/min (HCC)   Hydrate well.      Dry mouth   Chronic.  Hydrate well.  Sips of water      Hypokalemia   On KCl, Mag Obtain c-Met      Relevant Orders   Comprehensive metabolic panel with GFR   Magnesium    Major depressive disorder, recurrent episode, severe (HCC)   Seeing Dr Vincente Doing better Psychology referral through Dr Jonothan office      Migraine   Will ref to Dr Onita for a neurology consultation      Relevant Orders   Ambulatory referral to Neurology   Tremor of both hands - Primary   Chronic B hand tremor L>R, worse Neurol ref - Dr Onita      Relevant Orders   Ambulatory referral to Neurology   Vasovagal near syncope   Ortostatic hypotension - corrected CT of the head without any acute abnormality  MRI brian and lumbar spine with no acute abnormality TTE normal AKI prerenal failure Orthostatic hypotension,  Electrolyte abnormalities, Hypokalemia 2.3 on admission, hypomagnesemia 10/23 Renal US  : No hydronephrosis. Unremarkable renal ultrasound.  most likely hypovolemia given the reflex tachycardia, urine studies with urine sodium of <10 She is currently doing well okay to go back to work on Friday.  Okay to drive      Other Visit Diagnoses       Postmenopausal estrogen deficiency       Relevant Orders   DG Bone Density         Meds ordered this encounter   Medications   potassium chloride  (KLOR-CON  10) 10 MEQ tablet    Sig: Take 2 tablets (20 mEq total) by mouth in the morning and at bedtime.    Dispense:  120 tablet    Refill:  5      Follow-up: Return in about 3 months (around 10/01/2024) for a follow-up visit.  Marolyn Noel, MD

## 2024-07-01 NOTE — Assessment & Plan Note (Signed)
 Chronic.  Hydrate well.  Sips of water

## 2024-07-08 ENCOUNTER — Inpatient Hospital Stay: Attending: Nurse Practitioner

## 2024-07-22 NOTE — Progress Notes (Deleted)
 Office Visit Note  Patient: Emily Park             Date of Birth: 1971/05/13           MRN: [id number]             PCP: Garald Karlynn GAILS, MD Referring: Geofm Glade PARAS, MD Visit Date: 07/23/2024 Occupation: Data Unavailable  Subjective:  No chief complaint on file.   History of Present Illness: Emily Park is a 53 y.o. female ***     Activities of Daily Living:  Patient reports morning stiffness for *** {minute/hour:19697}.   Patient {ACTIONS;DENIES/REPORTS:21021675::Denies} nocturnal pain.  Difficulty dressing/grooming: {ACTIONS;DENIES/REPORTS:21021675::Denies} Difficulty climbing stairs: {ACTIONS;DENIES/REPORTS:21021675::Denies} Difficulty getting out of chair: {ACTIONS;DENIES/REPORTS:21021675::Denies} Difficulty using hands for taps, buttons, cutlery, and/or writing: {ACTIONS;DENIES/REPORTS:21021675::Denies}  No Rheumatology ROS completed.   PMFS History:  Patient Active Problem List   Diagnosis Date Noted   Tremor of both hands 07/01/2024   Ataxia 07/01/2024   Hyperglycemia 02/15/2024   Arthralgia 02/15/2024   Rib pain on left side 02/15/2024   Hematoma of buttock 07/19/2023   Post-traumatic stress disorder, chronic 05/11/2023   GAD (generalized anxiety disorder) 05/03/2023   Bereavement, uncomplicated 05/03/2023   MDD (major depressive disorder), recurrent episode, moderate (HCC) 05/03/2023   Insomnia 03/20/2023   MDD (major depressive disorder), recurrent severe, without psychosis (HCC) 12/28/2022   CKD stage 3a, GFR 45-59 ml/min (HCC) 12/25/2022   Chronic anemia 12/25/2022   Major depressive disorder, recurrent episode, severe (HCC) 12/25/2022   Generalized anxiety disorder 12/24/2022   Hypoglycemia 12/21/2022   Thyroid  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   IBS (irritable bowel syndrome) 10/03/2022   Acute encephalopathy 06/06/2022   High anion gap metabolic acidosis 06/06/2022    Complete rectal prolapse 07/07/2021   Anemia, iron  deficiency 05/13/2021   Hypokalemia 05/13/2021   Diarrhea 04/14/2021   Intractable vomiting with nausea 04/14/2021   UTI (urinary tract infection) 04/14/2021   Sepsis (HCC) 04/13/2021   Atypical squamous cells of undetermined significance on cytologic smear of cervix (ASC-US ) 02/24/2021   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   Anxiety 02/24/2021   Heart murmur 02/24/2021   Menorrhagia 02/24/2021   Thyroid  nodule 10/26/2020   Acute renal failure superimposed on stage 2 chronic kidney disease    Hypotension    Hypovolemic shock (HCC) 08/29/2020   Rectal prolapse 04/05/2020   Migraine 02/27/2019   Mild episode of recurrent major depressive disorder 10/25/2018   Abnormal thyroid  stimulating hormone (TSH) level 10/24/2018   Vasovagal near syncope 10/10/2018   Lung nodules 11/14/2015   Paroxysmal SVT (supraventricular tachycardia) 11/13/2015   Attention deficit hyperactivity disorder (ADHD) 11/13/2015    Past Medical History:  Diagnosis Date   Abnormal TSH    ADD (attention deficit disorder)    Anemia    Anxiety    Arrhythmia    Decreased libido    Depression    Heart murmur    Hyperlipidemia    Hypokalemia    Migraine    Nephrolithiasis    Pap smear abnormality of cervix/human papillomavirus (HPV) positive    Sleep difficulties    SVT (supraventricular tachycardia)     Family History  Problem Relation Age of Onset   Hypothyroidism Mother    Depression Mother    Thyroid  disease Mother    Alcohol  abuse Father    Hypertension Father    Heart attack Father        Age 22   AAA (  abdominal aortic aneurysm) Father    Depression Sister    Hypothyroidism Sister    Hyperthyroidism Sister    Depression Maternal Grandmother    Diabetes Maternal Grandfather    Breast cancer Paternal Grandmother 45 - 20   Depression Cousin    Past Surgical History:  Procedure Laterality Date   ABLATION     Cardiac ablation, endometrial ablation    BREAST EXCISIONAL BIOPSY Right 2002   CARDIOVERSION     COLONOSCOPY WITH PROPOFOL  N/A 05/16/2021   Procedure: COLONOSCOPY WITH PROPOFOL ;  Surgeon: Dianna Specking, MD;  Location: [facility];  Service: Endoscopy;  Laterality: N/A;   COLPOSCOPY     w/ cervical biopsy   DILITATION & CURRETTAGE/HYSTROSCOPY WITH NOVASURE ABLATION N/A 09/11/2014   Procedure: DILATATION & CURETTAGE/HYSTEROSCOPY WITH NOVASURE ABLATION;  Surgeon: Charlie JINNY Flowers, MD;  Location: [facility];  Service: Gynecology;  Laterality: N/A;   GIVENS CAPSULE STUDY N/A 05/16/2021   Procedure: GIVENS CAPSULE STUDY;  Surgeon: Dianna Specking, MD;  Location: [facility];  Service: Endoscopy;  Laterality: N/A;   XI ROBOT ASSISTED RECTOPEXY N/A 07/07/2021   Procedure: XI ROBOT ASSISTED RECTOPEXY;  Surgeon: Sheldon Standing, MD;  Location: [facility];  Service: General;  Laterality: N/A;   XI ROBOTIC ASSISTED LOWER ANTERIOR RESECTION N/A 07/07/2021   Procedure: XI ROBOTIC ASSISTED LOW ANTERIOR RECTOSIGMOID RESECTION WITH RIGID PROCTOSCOPY, TAP BLOCK;  Surgeon: Sheldon Standing, MD;  Location: [facility];  Service: General;  Laterality: N/A;   Social History   Tobacco Use   Smoking status: Never    Passive exposure: Current   Smokeless tobacco: Never  Vaping Use   Vaping status: Never Used  Substance Use Topics   Alcohol  use: Not Currently    Alcohol /week: 2.0 standard drinks of alcohol     Types: 2 Glasses of wine per week   Drug use: Not Currently   Social History   Social History Narrative   Lives at home with two sons.   Right-handed.   60 ounces of tea and soda per day.     Immunization History  Administered Date(s) Administered   Influenza,inj,Quad PF,6+ Mos 10/12/2018   PFIZER(Purple Top)SARS-COV-2 Vaccination 01/20/2020, 02/11/2020, 08/23/2020   Td 05/28/2013   Td (Adult), 2 Lf Tetanus Toxid, Preservative Free 08/28/2005, 02/25/2009     Objective: Vital Signs: There were no vitals taken for this visit.   Physical  Exam   Musculoskeletal Exam: ***  CDAI Exam: CDAI Score: -- Patient Global: --; Provider Global: -- Swollen: --; Tender: -- Joint Exam 07/23/2024   No joint exam has been documented for this visit   There is currently no information documented on the homunculus. Go to the Rheumatology activity and complete the homunculus joint exam.  Investigation: No additional findings.  Imaging: No results found.  Recent Labs: Lab Results  Component Value Date   WBC 8.9 04/15/2024   HGB 13.2 04/15/2024   PLT 426 (H) 04/15/2024   NA 138 07/01/2024   K 3.2 (L) 07/01/2024   CL 106 07/01/2024   CO2 23 07/01/2024   GLUCOSE 105 (H) 07/01/2024   BUN 13 07/01/2024   CREATININE 1.44 (H) 07/01/2024   BILITOT 0.3 07/01/2024   ALKPHOS 72 07/01/2024   AST 15 07/01/2024   ALT 12 07/01/2024   PROT 7.5 07/01/2024   ALBUMIN 4.5 07/01/2024   CALCIUM 9.3 07/01/2024   GFRAA >60 11/14/2015    Speciality Comments: No specialty comments available.  Procedures:  No procedures performed Allergies: Augmentin  [amoxicillin -pot clavulanate] and Zithromax [azithromycin]  Assessment / Plan:     Visit Diagnoses: No diagnosis found.  Orders: No orders of the defined types were placed in this encounter.  No orders of the defined types were placed in this encounter.   Face-to-face time spent with patient was *** minutes. Greater than 50% of time was spent in counseling and coordination of care.  Follow-Up Instructions: No follow-ups on file.   Lonni LELON Ester, MD  Note - This record has been created using [employer].  Chart creation errors have been sought, but may not always  have been located. Such creation errors do not reflect on  the standard of medical care.

## 2024-07-23 ENCOUNTER — Encounter: Admitting: Internal Medicine

## 2024-07-29 ENCOUNTER — Ambulatory Visit: Payer: Self-pay

## 2024-07-29 NOTE — Telephone Encounter (Signed)
 FYI Only or Action Required?: FYI only for provider: appointment scheduled on 07/30/24.  Patient was last seen in primary care on 07/01/2024 by Plotnikov, Karlynn GAILS, MD.  Called Nurse Triage reporting Hypotension, Fall, Vomiting, and Dizziness.  Symptoms began several days ago.  Interventions attempted: Prescription medications: Zofran , Phenergan .  Symptoms are: vomiting, lightheaded, falling completely resolved. Hypotension 90/60, HR 80 this morning.  Triage Disposition: See Physician Within 24 Hours  Patient/caregiver understands and will follow disposition?: Yes            Copied from CRM #[id number]. Topic: Clinical - Red Word Triage >> Jul 29, 2024  9:53 AM Eva FALCON wrote: Red Word that prompted transfer to Nurse Triage: [place] twice on Friday, threw up 2-3 times, dizziness, headaches, low blood pressure 90/60. Tried to drink water but still not feeling better. Reason for Disposition  [1] MODERATE dizziness (e.g., interferes with normal activities) AND [2] has NOT been evaluated by doctor (or NP/PA) for this  (Exception: Dizziness caused by heat exposure, sudden standing, or poor fluid intake.)  Answer Assessment - Initial Assessment Questions 1. DESCRIPTION: Describe your dizziness.     Lightheaded, weakness.  2. LIGHTHEADED: Do you feel lightheaded? (e.g., somewhat faint, woozy, weak upon standing)     Yes.  3. VERTIGO: Do you feel like either you or the room is spinning or tilting? (i.e., vertigo)     No.  4. SEVERITY: How bad is it?  Do you feel like you are going to faint? Can you stand and walk?     Not severe, patient able to stand and walk without assistance and get ready. Patient had 2 falls on Friday at work. Not present now.  5. ONSET:  When did the dizziness begin?     Last night when vomiting.  6. AGGRAVATING FACTORS: Does anything make it worse? (e.g., standing, change in head position)     Vomiting.   7. HEART RATE: Can you tell me your  heart rate? How many beats in 15 seconds?  (Note: Not all patients can do this.)       BP 90/60 this AM and HR in the 80s.  8. CAUSE: What do you think is causing the dizziness? (e.g., decreased fluids or food, diarrhea, emotional distress, heat exposure, new medicine, sudden standing, vomiting; unknown)    Vomiting.   9. RECURRENT SYMPTOM: Have you had dizziness before? If Yes, ask: When was the last time? What happened that time?     She states she has a past history of orthostatic hypotension.  10. OTHER SYMPTOMS: Do you have any other symptoms? (e.g., fever, chest pain, vomiting, diarrhea, bleeding)       Patient states she fell Friday evening at work twice (felt like she lost her balance), landed on her bottom, denies any head injury. 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 Began having 2 episode of vomiting last night which have resolved and she has been able to drink fluids. Denies bleeding, chest pain, SOB, unilateral numbness or weakness, changes in speech or vision.  Protocols used: Dizziness - Lightheadedness-A-AH

## 2024-07-30 ENCOUNTER — Ambulatory Visit: Admitting: Internal Medicine

## 2024-07-31 ENCOUNTER — Encounter: Payer: Self-pay | Admitting: Internal Medicine

## 2024-07-31 ENCOUNTER — Ambulatory Visit: Admitting: Internal Medicine

## 2024-07-31 VITALS — BP 100/62 | HR 82 | Temp 97.7°F | Ht 64.0 in | Wt 126.0 lb

## 2024-07-31 DIAGNOSIS — R197 Diarrhea, unspecified: Secondary | ICD-10-CM | POA: Diagnosis not present

## 2024-07-31 DIAGNOSIS — E861 Hypovolemia: Secondary | ICD-10-CM

## 2024-07-31 DIAGNOSIS — R112 Nausea with vomiting, unspecified: Secondary | ICD-10-CM | POA: Diagnosis not present

## 2024-07-31 DIAGNOSIS — R739 Hyperglycemia, unspecified: Secondary | ICD-10-CM

## 2024-07-31 MED ORDER — DIPHENOXYLATE-ATROPINE 2.5-0.025 MG PO TABS: 1.0000 | ORAL_TABLET | Freq: Four times a day (QID) | ORAL | 0 refills | Status: AC | PRN

## 2024-07-31 MED ORDER — ONDANSETRON 4 MG PO TBDP: 4.0000 mg | ORAL_TABLET | Freq: Three times a day (TID) | ORAL | 1 refills | Status: AC | PRN

## 2024-07-31 NOTE — Progress Notes (Unsigned)
 Patient ID: Emily Park, female   DOB: Feb 28, 1971, 53 y.o.   MRN: [id number]        Chief Complaint: follow up low BP this AM after 5 days onset n/v/d       HPI:  Emily Park is a 53 y.o. female here with above, overall moderate it seems, but with reduced po intake due to n/v, abd pain, diarrhea and fatigue.  Has missed several days work after tried to go to work and fell out with low BP.  Denies ST, fever, chills, blood.  Today overall just now starting to feel improved, states n/v/d mild to mod only now..  But still with lower BP this am and fatigue.  Pt not taking any vasoactive meds.   Pt denies chest pain, increased sob or doe, wheezing, orthopnea, PND, increased LE swelling, palpitations, dizziness or syncope.   Pt denies polydipsia, polyuria, or new focal neuro s/s.          Wt Readings from Last 3 Encounters:  07/31/24 126 lb (57.2 kg)  07/01/24 121 lb (54.9 kg)  04/15/24 117 lb 9.6 oz (53.3 kg)   BP Readings from Last 3 Encounters:  07/31/24 100/62  07/01/24 114/72  04/15/24 116/70         Past Medical History:  Diagnosis Date   Abnormal TSH    ADD (attention deficit disorder)    Anemia    Anxiety    Arrhythmia    Decreased libido    Depression    Heart murmur    Hyperlipidemia    Hypokalemia    Migraine    Nephrolithiasis    Pap smear abnormality of cervix/human papillomavirus (HPV) positive    Sleep difficulties    SVT (supraventricular tachycardia)    Past Surgical History:  Procedure Laterality Date   ABLATION     Cardiac ablation, endometrial ablation   BREAST EXCISIONAL BIOPSY Right 2002   CARDIOVERSION     COLONOSCOPY WITH PROPOFOL  N/A 05/16/2021   Procedure: COLONOSCOPY WITH PROPOFOL ;  Surgeon: Dianna Specking, MD;  Location: [facility];  Service: Endoscopy;  Laterality: N/A;   COLPOSCOPY     w/ cervical biopsy   DILITATION & CURRETTAGE/HYSTROSCOPY WITH NOVASURE ABLATION N/A 09/11/2014   Procedure: DILATATION & CURETTAGE/HYSTEROSCOPY WITH  NOVASURE ABLATION;  Surgeon: Charlie JINNY Flowers, MD;  Location: [facility];  Service: Gynecology;  Laterality: N/A;   GIVENS CAPSULE STUDY N/A 05/16/2021   Procedure: GIVENS CAPSULE STUDY;  Surgeon: Dianna Specking, MD;  Location: [facility];  Service: Endoscopy;  Laterality: N/A;   XI ROBOT ASSISTED RECTOPEXY N/A 07/07/2021   Procedure: XI ROBOT ASSISTED RECTOPEXY;  Surgeon: Sheldon Standing, MD;  Location: [facility];  Service: General;  Laterality: N/A;   XI ROBOTIC ASSISTED LOWER ANTERIOR RESECTION N/A 07/07/2021   Procedure: XI ROBOTIC ASSISTED LOW ANTERIOR RECTOSIGMOID RESECTION WITH RIGID PROCTOSCOPY, TAP BLOCK;  Surgeon: Sheldon Standing, MD;  Location: [facility];  Service: General;  Laterality: N/A;    reports that she has never smoked. She has been exposed to tobacco smoke. She has never used smokeless tobacco. She reports that she does not currently use alcohol  after a past usage of about 2.0 standard drinks of alcohol  per week. She reports that she does not currently use drugs. family history includes AAA (abdominal aortic aneurysm) in her father; Alcohol  abuse in her father; Breast cancer (age of onset: 40 - 19) in her paternal grandmother; Depression in her cousin, maternal grandmother, mother, and sister; Diabetes in her maternal grandfather;  Heart attack in her father; Hypertension in her father; Hyperthyroidism in her sister; Hypothyroidism in her mother and sister; Thyroid  disease in her mother. Allergies  Allergen Reactions   Augmentin  [Amoxicillin -Pot Clavulanate]     Stomach pain   Zithromax [Azithromycin] Rash   Current Outpatient Medications on File Prior to Visit  Medication Sig Dispense Refill   Cholecalciferol (VITAMIN D3) 50 MCG (2000 UT) capsule Take 2,000 Units by mouth daily.     clonazePAM  (KLONOPIN ) 0.5 MG tablet Take 1 tablet (0.5 mg total) by mouth daily as needed for anxiety. 30 tablet 0   doxepin  (SINEQUAN ) 100 MG capsule SMARTSIG:1 Capsule(s) By Mouth Every Evening      escitalopram (LEXAPRO) 10 MG tablet Take 10 mg by mouth once.     LINZESS  145 MCG CAPS capsule TAKE 1 CAPSULE (145 MCG TOTAL) BY MOUTH ONCE DAILY 90 capsule 3   LYLLANA 0.05 MG/24HR patch Place 1 patch onto the skin.     Multiple Vitamin (MULTIVITAMIN WITH MINERALS) TABS tablet Take 1 tablet by mouth every morning.     pantoprazole  (PROTONIX ) 40 MG tablet TAKE 1 TABLET BY MOUTH TWICE A DAY 180 tablet 3   polyethylene glycol (MIRALAX  / GLYCOLAX ) 17 g packet Take 17 g by mouth 2 (two) times daily. 14 each 0   polyvinyl alcohol  (LIQUIFILM TEARS) 1.4 % ophthalmic solution Place 1 drop into both eyes as needed for dry eyes. 15 mL 0   potassium chloride  (KLOR-CON  10) 10 MEQ tablet Take 2 tablets (20 mEq total) by mouth in the morning and at bedtime. 120 tablet 5   prazosin  (MINIPRESS ) 1 MG capsule Take 1 capsule (1 mg total) by mouth at bedtime. 90 capsule 0   progesterone (PROMETRIUM) 100 MG capsule Take 100 mg by mouth at bedtime.     Sodium Fluoride  (PREVIDENT  5000 BOOSTER PLUS) 1.1 % PSTE As directed tid 100 mL 3   topiramate  (TOPAMAX ) 100 MG tablet TAKE 1 TABLET BY MOUTH EVERY DAY 90 tablet 0   No current facility-administered medications on file prior to visit.        ROS:  All others reviewed and negative.  Objective        PE:  BP 100/62 (BP Location: Right Arm, Patient Position: Sitting, Cuff Size: Normal)   Pulse 82   Temp 97.7 F (36.5 C) (Oral)   Ht 5' 4 (1.626 m)   Wt 126 lb (57.2 kg)   SpO2 98%   BMI 21.63 kg/m                 Constitutional: Pt appears tired               HENT: Head: NCAT.                Right Ear: External ear normal.                 Left Ear: External ear normal.                Eyes: . Pupils are equal, round, and reactive to light. Conjunctivae and EOM are normal               Nose: without d/c or deformity               Neck: Neck supple. Gross normal ROM               Cardiovascular: Normal rate and regular rhythm.  Pulmonary/Chest:  Effort normal and breath sounds without rales or wheezing.                Abd:  Soft, NT, ND, + BS, no organomegaly               Neurological: Pt is alert. At baseline orientation, motor grossly intact               Skin: Skin is warm. No rashes, no other new lesions, LE edema - none               Psychiatric: Pt behavior is normal without agitation   Micro: none  Cardiac tracings I have personally interpreted today:  none  Pertinent Radiological findings (summarize): none   Lab Results  Component Value Date   WBC 8.9 04/15/2024   HGB 13.2 04/15/2024   HCT 39.5 04/15/2024   PLT 426 (H) 04/15/2024   GLUCOSE 105 (H) 07/01/2024   CHOL 208 (H) 02/24/2014   TRIG 81.0 02/24/2014   HDL 54.90 02/24/2014   LDLCALC 137 (H) 02/24/2014   ALT 12 07/01/2024   AST 15 07/01/2024   NA 138 07/01/2024   K 3.2 (L) 07/01/2024   CL 106 07/01/2024   CREATININE 1.44 (H) 07/01/2024   BUN 13 07/01/2024   CO2 23 07/01/2024   TSH 0.45 02/15/2024   INR 1.1 12/20/2022   HGBA1C 5.6 02/15/2024   Assessment/Plan:  Emily Park is a 53 y.o. White or Caucasian [1] female with  has a past medical history of Abnormal TSH, ADD (attention deficit disorder), Anemia, Anxiety, Arrhythmia, Decreased libido, Depression, Heart murmur, Hyperlipidemia, Hypokalemia, Migraine, Nephrolithiasis, Pap smear abnormality of cervix/human papillomavirus (HPV) positive, Sleep difficulties, and SVT (supraventricular tachycardia).  Nausea vomiting and diarrhea Likely AGE it seems with benign exam today, fr zofran  prn, lomotil  prn, cont push fluids, gave work note  Low blood pressure Pt not taking vasoactive meds, has recent reduced po intake, BP now low normal, pt encouraged to push oral fluids over the next 3 days, declines labs for now  Hyperglycemia Lab Results  Component Value Date   HGBA1C 5.6 02/15/2024   Stable, pt to continue current medical treatment - diet,wt control   Followup: Return if symptoms worsen or  fail to improve.  Lynwood Rush, MD 08/02/2024 8:12 PM [facility] [place] [facility]

## 2024-07-31 NOTE — Patient Instructions (Signed)
 Please take all new medication as prescribed - the nausea medication, and lomotil as needed  Please work on pushing more fluids in the next 2-3 days  Please continue all other medications as before, and refills have been done if requested.  Please have the pharmacy call with any other refills you may need.  Please keep your appointments with your specialists as you may have planned  You are given the work note

## 2024-08-02 ENCOUNTER — Encounter: Payer: Self-pay | Admitting: Internal Medicine

## 2024-08-02 DIAGNOSIS — I959 Hypotension, unspecified: Secondary | ICD-10-CM | POA: Insufficient documentation

## 2024-08-02 DIAGNOSIS — R112 Nausea with vomiting, unspecified: Secondary | ICD-10-CM | POA: Insufficient documentation

## 2024-08-02 NOTE — Assessment & Plan Note (Signed)
 Lab Results  Component Value Date   HGBA1C 5.6 02/15/2024   Stable, pt to continue current medical treatment - diet,wt control

## 2024-08-02 NOTE — Assessment & Plan Note (Addendum)
 Pt not taking vasoactive meds, has recent reduced po intake, BP now low normal, pt encouraged to push oral fluids over the next 3 days, declines labs for now

## 2024-08-02 NOTE — Assessment & Plan Note (Signed)
 Likely AGE it seems with benign exam today, fr zofran  prn, lomotil  prn, cont push fluids, gave work note

## 2024-08-18 ENCOUNTER — Telehealth: Payer: Self-pay

## 2024-08-18 DIAGNOSIS — M2550 Pain in unspecified joint: Secondary | ICD-10-CM

## 2024-08-18 NOTE — Telephone Encounter (Signed)
 Copied from CRM #[id number]. Topic: Referral - Question >> Aug 18, 2024 11:06 AM Sophia H wrote: Reason for CRM: Patient states she was given a referral to Dr. Jeannetta - Rheumatology and she missed the appointment so now they will not see her. Wanting to know if a new referral can be sent over to [facility]. Please advise # [phone]

## 2024-08-22 NOTE — Telephone Encounter (Signed)
 Attempted to reach the pt in regards to referral... Pts phone went to VM and I am unable to lvm at this time due to mailbox being full.  Please ask pt of the following per provider Rheumatology is a very difficult specialty to get into. If you are able to get an appointment, please try to keep it. Are they refusing to see you, period? Or are they saying they need a new referral?

## 2024-09-01 NOTE — Telephone Encounter (Unsigned)
 Copied from CRM #[id number]. Topic: Referral - Question >> Sep 01, 2024  3:35 PM [name] wrote: Patient needs another rheumatology referral. She had a no show for the one that was referred to her and they won't take her. Can send a message through Mychart once another one has been selected for her.

## 2024-09-03 NOTE — Addendum Note (Signed)
 Addended by: Torell Minder V on: 09/03/2024 08:18 PM   Modules accepted: Orders

## 2024-09-03 NOTE — Telephone Encounter (Signed)
 Okay.  Thanks.

## 2024-09-11 ENCOUNTER — Encounter: Payer: Self-pay | Admitting: Internal Medicine

## 2024-10-14 ENCOUNTER — Inpatient Hospital Stay: Admitting: Nurse Practitioner

## 2024-10-14 ENCOUNTER — Inpatient Hospital Stay: Attending: Nurse Practitioner
